# Patient Record
Sex: Male | Born: 1965 | Race: White | Hispanic: No | Smoking: Former smoker
Health system: Southern US, Community
[De-identification: ages and names within clinical notes are randomized; demographics above are authoritative.]

## PROBLEM LIST (undated history)

## (undated) DIAGNOSIS — I10 Essential (primary) hypertension: Secondary | ICD-10-CM

## (undated) DIAGNOSIS — A419 Sepsis, unspecified organism: Secondary | ICD-10-CM

## (undated) DIAGNOSIS — G9340 Encephalopathy, unspecified: Secondary | ICD-10-CM

## (undated) DIAGNOSIS — K219 Gastro-esophageal reflux disease without esophagitis: Secondary | ICD-10-CM

## (undated) DIAGNOSIS — R7989 Other specified abnormal findings of blood chemistry: Secondary | ICD-10-CM

## (undated) DIAGNOSIS — E46 Unspecified protein-calorie malnutrition: Secondary | ICD-10-CM

## (undated) DIAGNOSIS — F32A Depression, unspecified: Secondary | ICD-10-CM

## (undated) DIAGNOSIS — I4891 Unspecified atrial fibrillation: Secondary | ICD-10-CM

## (undated) DIAGNOSIS — J9600 Acute respiratory failure, unspecified whether with hypoxia or hypercapnia: Secondary | ICD-10-CM

## (undated) DIAGNOSIS — IMO0002 Reserved for concepts with insufficient information to code with codable children: Secondary | ICD-10-CM

## (undated) DIAGNOSIS — K819 Cholecystitis, unspecified: Secondary | ICD-10-CM

## (undated) DIAGNOSIS — D649 Anemia, unspecified: Secondary | ICD-10-CM

## (undated) DIAGNOSIS — N179 Acute kidney failure, unspecified: Secondary | ICD-10-CM

## (undated) DIAGNOSIS — A0472 Enterocolitis due to Clostridium difficile, not specified as recurrent: Secondary | ICD-10-CM

## (undated) DIAGNOSIS — I519 Heart disease, unspecified: Secondary | ICD-10-CM

## (undated) DIAGNOSIS — F329 Major depressive disorder, single episode, unspecified: Secondary | ICD-10-CM

## (undated) DIAGNOSIS — I82409 Acute embolism and thrombosis of unspecified deep veins of unspecified lower extremity: Secondary | ICD-10-CM

## (undated) DIAGNOSIS — K251 Acute gastric ulcer with perforation: Secondary | ICD-10-CM

## (undated) DIAGNOSIS — R778 Other specified abnormalities of plasma proteins: Secondary | ICD-10-CM

## (undated) HISTORY — PX: COLON SURGERY: SHX602

## (undated) HISTORY — PX: HERNIA REPAIR: SHX51

## (undated) HISTORY — PX: NECK SURGERY: SHX720

---

## 1998-09-20 ENCOUNTER — Encounter: Admission: RE | Admit: 1998-09-20 | Discharge: 1998-12-19 | Payer: Self-pay | Admitting: Orthopedic Surgery

## 1998-10-22 ENCOUNTER — Encounter: Payer: Self-pay | Admitting: Specialist

## 1998-10-22 ENCOUNTER — Observation Stay (HOSPITAL_COMMUNITY): Admission: RE | Admit: 1998-10-22 | Discharge: 1998-10-23 | Payer: Self-pay | Admitting: Specialist

## 1999-08-24 ENCOUNTER — Inpatient Hospital Stay (HOSPITAL_COMMUNITY): Admission: AD | Admit: 1999-08-24 | Discharge: 1999-08-29 | Payer: Self-pay | Admitting: *Deleted

## 2002-01-07 ENCOUNTER — Ambulatory Visit (HOSPITAL_BASED_OUTPATIENT_CLINIC_OR_DEPARTMENT_OTHER): Admission: RE | Admit: 2002-01-07 | Discharge: 2002-01-07 | Payer: Self-pay | Admitting: Urology

## 2002-01-14 ENCOUNTER — Encounter: Admission: RE | Admit: 2002-01-14 | Discharge: 2002-01-14 | Payer: Self-pay | Admitting: Neurosurgery

## 2002-01-14 ENCOUNTER — Encounter: Payer: Self-pay | Admitting: Neurosurgery

## 2002-01-28 ENCOUNTER — Encounter: Admission: RE | Admit: 2002-01-28 | Discharge: 2002-01-28 | Payer: Self-pay | Admitting: Neurosurgery

## 2002-01-28 ENCOUNTER — Encounter: Payer: Self-pay | Admitting: Neurosurgery

## 2002-02-11 ENCOUNTER — Encounter: Admission: RE | Admit: 2002-02-11 | Discharge: 2002-02-11 | Payer: Self-pay | Admitting: Neurosurgery

## 2002-02-11 ENCOUNTER — Encounter: Payer: Self-pay | Admitting: Neurosurgery

## 2004-11-02 ENCOUNTER — Ambulatory Visit: Payer: Self-pay | Admitting: Family Medicine

## 2004-12-07 ENCOUNTER — Ambulatory Visit: Payer: Self-pay | Admitting: Family Medicine

## 2004-12-20 ENCOUNTER — Ambulatory Visit: Payer: Self-pay | Admitting: Family Medicine

## 2005-01-17 ENCOUNTER — Ambulatory Visit: Payer: Self-pay | Admitting: Family Medicine

## 2005-01-31 ENCOUNTER — Ambulatory Visit: Payer: Self-pay | Admitting: Family Medicine

## 2005-02-28 ENCOUNTER — Ambulatory Visit: Payer: Self-pay | Admitting: Family Medicine

## 2005-04-11 ENCOUNTER — Ambulatory Visit: Payer: Self-pay | Admitting: Family Medicine

## 2005-05-09 ENCOUNTER — Ambulatory Visit: Payer: Self-pay | Admitting: Family Medicine

## 2005-08-08 ENCOUNTER — Ambulatory Visit: Payer: Self-pay | Admitting: Family Medicine

## 2005-09-04 ENCOUNTER — Ambulatory Visit: Payer: Self-pay | Admitting: Family Medicine

## 2006-03-02 ENCOUNTER — Ambulatory Visit (HOSPITAL_COMMUNITY): Admission: RE | Admit: 2006-03-02 | Discharge: 2006-03-02 | Payer: Self-pay | Admitting: Neurosurgery

## 2006-05-21 ENCOUNTER — Ambulatory Visit: Payer: Self-pay | Admitting: Family Medicine

## 2007-01-16 ENCOUNTER — Ambulatory Visit: Payer: Self-pay | Admitting: Family Medicine

## 2009-04-07 ENCOUNTER — Encounter: Admission: RE | Admit: 2009-04-07 | Discharge: 2009-04-07 | Payer: Self-pay | Admitting: Neurosurgery

## 2009-05-04 ENCOUNTER — Encounter: Admission: RE | Admit: 2009-05-04 | Discharge: 2009-05-04 | Payer: Self-pay | Admitting: Neurosurgery

## 2011-01-06 NOTE — Op Note (Signed)
[facility]  Patient:    James Mccormick, James Mccormick Visit Number: [id number] MRN: [id number]          Service Type: NES Location: [facility] Attending Physician:  Ellwood Handler Dictated by:   Verl Dicker, M.D. Proc. Date: 01/07/02 Admit Date:  01/07/2002   CC:         [name], M.D.   Operative Report  DATE OF BIRTH:  1965-09-26  LOCAL MEDICAL DOCTOR:  [name], M.D.  UROLOGIST:  Verl Dicker, M.D.  PREOPERATIVE DIAGNOSES:  Hematuria and flank pain.  POSTOPERATIVE DIAGNOSES:  Hematuria and flank pain.  INDICATIONS:  A 45 year old male, recovering alcoholic, currently taking Wellbutrin, Zoloft, Prevacid, and Singulair.  Recent thorough evaluation with Dr. Lysbeth Galas showed hematuria and proteinuria.  CT of the abdomen and pelvis, [facility], October 30, 2001, no abnormality identified.  Renal ultrasound, [facility], November 11, 2001, "within normal limits."  BUN 19, creatinine 1.2.  24-hour urine for protein November 29, 2001, 360 mg per 24 hours.  Urine culture negative on two separate occasions.  Office IVP, November 26, 2001, 13.1 cm right kidney, 13.7 cm left kidney, normal upper tracts, no filling defect or obstruction.  Cystoscopy, normal urethra and sphincter, nonobstructive prostate, blood-tinged efflux left orifice.  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  The patient feels his flank pain is much improved.  Presents to the operating room today for cystoscopy under anesthesia with retrogrades and upper tract urine sampling.  ANESTHESIA:  General.  DRAINS:  None.  COMPLICATIONS:  None.  DESCRIPTION OF PROCEDURE:  The patient was prepped and draped in the dorsal lithotomy position after institution of an adequate level of general anesthesia.  Bladder was carefully inspected with a well-lubricated panendoscope.  Bloody efflux left  orifice, pink-tinged efflux right orifice. Bladder itself showed no evidence of suspicious urothelium.  Bladder urine was sent for confirmation of hematuria and proteinuria.  Right and left ureteral catheters were then placed at 24 cm when a well-hydrated patient.  Urine was collected from both right and left renal pelvis and sent for confirmation of hematuria and proteinuria.  Retrogrades were then performed and showed no evidence of filling defect, obstruction, or anatomic deformity within the pelvis, calices, or ureters.  Ureteroscopy was not performed.  Bladder was drained and cystoscope was removed.  The patient was returned to recovery in satisfactory condition. Dictated by:   Verl Dicker, M.D. Attending Physician:  Ellwood Handler DD:  01/07/02 TD:  01/08/02 Job: [phone] [id number]

## 2012-09-17 ENCOUNTER — Other Ambulatory Visit (HOSPITAL_COMMUNITY): Payer: Self-pay | Admitting: Family Medicine

## 2012-09-17 DIAGNOSIS — R109 Unspecified abdominal pain: Secondary | ICD-10-CM

## 2012-09-19 ENCOUNTER — Ambulatory Visit (HOSPITAL_COMMUNITY): Admission: RE | Admit: 2012-09-19 | Payer: No Typology Code available for payment source | Source: Ambulatory Visit

## 2013-08-28 ENCOUNTER — Emergency Department (HOSPITAL_COMMUNITY): Payer: Medicaid Other

## 2013-08-28 ENCOUNTER — Emergency Department (HOSPITAL_COMMUNITY): Payer: Medicaid Other | Admitting: Anesthesiology

## 2013-08-28 ENCOUNTER — Inpatient Hospital Stay (HOSPITAL_COMMUNITY): Payer: Medicaid Other

## 2013-08-28 ENCOUNTER — Encounter (HOSPITAL_COMMUNITY): Payer: Medicaid Other | Admitting: Anesthesiology

## 2013-08-28 ENCOUNTER — Encounter (HOSPITAL_COMMUNITY): Admission: EM | Disposition: A | Discharge status: Home Health | Payer: Self-pay | Source: Home / Self Care

## 2013-08-28 ENCOUNTER — Encounter (HOSPITAL_COMMUNITY): Payer: Self-pay | Admitting: Emergency Medicine

## 2013-08-28 ENCOUNTER — Inpatient Hospital Stay (HOSPITAL_COMMUNITY)
Admission: EM | Admit: 2013-08-28 | Discharge: 2013-10-14 | DRG: 853 | Disposition: A | Discharge status: Home Health | Payer: Medicaid Other | Attending: General Surgery | Admitting: General Surgery

## 2013-08-28 DIAGNOSIS — E870 Hyperosmolality and hypernatremia: Secondary | ICD-10-CM | POA: Diagnosis not present

## 2013-08-28 DIAGNOSIS — A419 Sepsis, unspecified organism: Secondary | ICD-10-CM | POA: Diagnosis present

## 2013-08-28 DIAGNOSIS — R000 Tachycardia, unspecified: Secondary | ICD-10-CM | POA: Diagnosis not present

## 2013-08-28 DIAGNOSIS — T8132XA Disruption of internal operation (surgical) wound, not elsewhere classified, initial encounter: Secondary | ICD-10-CM | POA: Diagnosis not present

## 2013-08-28 DIAGNOSIS — R404 Transient alteration of awareness: Secondary | ICD-10-CM | POA: Diagnosis not present

## 2013-08-28 DIAGNOSIS — K255 Chronic or unspecified gastric ulcer with perforation: Secondary | ICD-10-CM | POA: Diagnosis present

## 2013-08-28 DIAGNOSIS — I10 Essential (primary) hypertension: Secondary | ICD-10-CM | POA: Diagnosis present

## 2013-08-28 DIAGNOSIS — E872 Acidosis, unspecified: Secondary | ICD-10-CM | POA: Diagnosis present

## 2013-08-28 DIAGNOSIS — T8140XA Infection following a procedure, unspecified, initial encounter: Secondary | ICD-10-CM | POA: Diagnosis not present

## 2013-08-28 DIAGNOSIS — Z818 Family history of other mental and behavioral disorders: Secondary | ICD-10-CM | POA: Diagnosis not present

## 2013-08-28 DIAGNOSIS — K219 Gastro-esophageal reflux disease without esophagitis: Secondary | ICD-10-CM | POA: Diagnosis present

## 2013-08-28 DIAGNOSIS — K316 Fistula of stomach and duodenum: Secondary | ICD-10-CM | POA: Diagnosis not present

## 2013-08-28 DIAGNOSIS — N170 Acute kidney failure with tubular necrosis: Secondary | ICD-10-CM | POA: Diagnosis not present

## 2013-08-28 DIAGNOSIS — F17200 Nicotine dependence, unspecified, uncomplicated: Secondary | ICD-10-CM | POA: Diagnosis present

## 2013-08-28 DIAGNOSIS — I214 Non-ST elevation (NSTEMI) myocardial infarction: Secondary | ICD-10-CM | POA: Diagnosis not present

## 2013-08-28 DIAGNOSIS — J4489 Other specified chronic obstructive pulmonary disease: Secondary | ICD-10-CM | POA: Diagnosis present

## 2013-08-28 DIAGNOSIS — R109 Unspecified abdominal pain: Secondary | ICD-10-CM

## 2013-08-28 DIAGNOSIS — R778 Other specified abnormalities of plasma proteins: Secondary | ICD-10-CM

## 2013-08-28 DIAGNOSIS — G9341 Metabolic encephalopathy: Secondary | ICD-10-CM | POA: Diagnosis present

## 2013-08-28 DIAGNOSIS — R1013 Epigastric pain: Secondary | ICD-10-CM | POA: Diagnosis present

## 2013-08-28 DIAGNOSIS — I509 Heart failure, unspecified: Secondary | ICD-10-CM | POA: Diagnosis not present

## 2013-08-28 DIAGNOSIS — I248 Other forms of acute ischemic heart disease: Secondary | ICD-10-CM | POA: Diagnosis not present

## 2013-08-28 DIAGNOSIS — R5381 Other malaise: Secondary | ICD-10-CM | POA: Diagnosis not present

## 2013-08-28 DIAGNOSIS — Y838 Other surgical procedures as the cause of abnormal reaction of the patient, or of later complication, without mention of misadventure at the time of the procedure: Secondary | ICD-10-CM | POA: Diagnosis not present

## 2013-08-28 DIAGNOSIS — E2749 Other adrenocortical insufficiency: Secondary | ICD-10-CM | POA: Diagnosis present

## 2013-08-28 DIAGNOSIS — E119 Type 2 diabetes mellitus without complications: Secondary | ICD-10-CM | POA: Diagnosis present

## 2013-08-28 DIAGNOSIS — K651 Peritoneal abscess: Secondary | ICD-10-CM | POA: Diagnosis present

## 2013-08-28 DIAGNOSIS — J449 Chronic obstructive pulmonary disease, unspecified: Secondary | ICD-10-CM | POA: Diagnosis present

## 2013-08-28 DIAGNOSIS — E875 Hyperkalemia: Secondary | ICD-10-CM | POA: Diagnosis not present

## 2013-08-28 DIAGNOSIS — Z8249 Family history of ischemic heart disease and other diseases of the circulatory system: Secondary | ICD-10-CM

## 2013-08-28 DIAGNOSIS — E440 Moderate protein-calorie malnutrition: Secondary | ICD-10-CM | POA: Diagnosis not present

## 2013-08-28 DIAGNOSIS — B3789 Other sites of candidiasis: Secondary | ICD-10-CM | POA: Diagnosis not present

## 2013-08-28 DIAGNOSIS — A0472 Enterocolitis due to Clostridium difficile, not specified as recurrent: Secondary | ICD-10-CM | POA: Diagnosis not present

## 2013-08-28 DIAGNOSIS — R198 Other specified symptoms and signs involving the digestive system and abdomen: Secondary | ICD-10-CM

## 2013-08-28 DIAGNOSIS — I5021 Acute systolic (congestive) heart failure: Secondary | ICD-10-CM | POA: Diagnosis not present

## 2013-08-28 DIAGNOSIS — R6520 Severe sepsis without septic shock: Secondary | ICD-10-CM | POA: Diagnosis present

## 2013-08-28 DIAGNOSIS — G9340 Encephalopathy, unspecified: Secondary | ICD-10-CM

## 2013-08-28 DIAGNOSIS — F411 Generalized anxiety disorder: Secondary | ICD-10-CM

## 2013-08-28 DIAGNOSIS — J189 Pneumonia, unspecified organism: Secondary | ICD-10-CM | POA: Diagnosis not present

## 2013-08-28 DIAGNOSIS — I2489 Other forms of acute ischemic heart disease: Secondary | ICD-10-CM | POA: Diagnosis not present

## 2013-08-28 DIAGNOSIS — D649 Anemia, unspecified: Secondary | ICD-10-CM | POA: Diagnosis present

## 2013-08-28 DIAGNOSIS — R1313 Dysphagia, pharyngeal phase: Secondary | ICD-10-CM | POA: Diagnosis not present

## 2013-08-28 DIAGNOSIS — F329 Major depressive disorder, single episode, unspecified: Secondary | ICD-10-CM | POA: Diagnosis present

## 2013-08-28 DIAGNOSIS — F4310 Post-traumatic stress disorder, unspecified: Secondary | ICD-10-CM | POA: Diagnosis present

## 2013-08-28 DIAGNOSIS — F3341 Major depressive disorder, recurrent, in partial remission: Secondary | ICD-10-CM

## 2013-08-28 DIAGNOSIS — G44209 Tension-type headache, unspecified, not intractable: Secondary | ICD-10-CM | POA: Diagnosis present

## 2013-08-28 DIAGNOSIS — R6521 Severe sepsis with septic shock: Secondary | ICD-10-CM

## 2013-08-28 DIAGNOSIS — J9600 Acute respiratory failure, unspecified whether with hypoxia or hypercapnia: Secondary | ICD-10-CM

## 2013-08-28 DIAGNOSIS — R7989 Other specified abnormal findings of blood chemistry: Secondary | ICD-10-CM

## 2013-08-28 DIAGNOSIS — I519 Heart disease, unspecified: Secondary | ICD-10-CM | POA: Diagnosis not present

## 2013-08-28 DIAGNOSIS — E876 Hypokalemia: Secondary | ICD-10-CM | POA: Diagnosis not present

## 2013-08-28 DIAGNOSIS — K650 Generalized (acute) peritonitis: Secondary | ICD-10-CM | POA: Diagnosis present

## 2013-08-28 DIAGNOSIS — F1011 Alcohol abuse, in remission: Secondary | ICD-10-CM | POA: Diagnosis present

## 2013-08-28 DIAGNOSIS — F17210 Nicotine dependence, cigarettes, uncomplicated: Secondary | ICD-10-CM | POA: Diagnosis present

## 2013-08-28 DIAGNOSIS — R931 Abnormal findings on diagnostic imaging of heart and coronary circulation: Secondary | ICD-10-CM

## 2013-08-28 DIAGNOSIS — J9819 Other pulmonary collapse: Secondary | ICD-10-CM | POA: Diagnosis not present

## 2013-08-28 DIAGNOSIS — K319 Disease of stomach and duodenum, unspecified: Secondary | ICD-10-CM

## 2013-08-28 DIAGNOSIS — T3995XA Adverse effect of unspecified nonopioid analgesic, antipyretic and antirheumatic, initial encounter: Secondary | ICD-10-CM | POA: Diagnosis present

## 2013-08-28 DIAGNOSIS — G4710 Hypersomnia, unspecified: Secondary | ICD-10-CM | POA: Diagnosis not present

## 2013-08-28 DIAGNOSIS — Z6827 Body mass index (BMI) 27.0-27.9, adult: Secondary | ICD-10-CM

## 2013-08-28 DIAGNOSIS — I469 Cardiac arrest, cause unspecified: Secondary | ICD-10-CM | POA: Diagnosis not present

## 2013-08-28 DIAGNOSIS — I4891 Unspecified atrial fibrillation: Secondary | ICD-10-CM

## 2013-08-28 DIAGNOSIS — J80 Acute respiratory distress syndrome: Secondary | ICD-10-CM | POA: Diagnosis present

## 2013-08-28 DIAGNOSIS — F32A Depression, unspecified: Secondary | ICD-10-CM | POA: Diagnosis present

## 2013-08-28 DIAGNOSIS — T81329A Deep disruption or dehiscence of operation wound, unspecified, initial encounter: Secondary | ICD-10-CM | POA: Diagnosis not present

## 2013-08-28 DIAGNOSIS — J90 Pleural effusion, not elsewhere classified: Secondary | ICD-10-CM | POA: Diagnosis not present

## 2013-08-28 HISTORY — DX: Major depressive disorder, single episode, unspecified: F32.9

## 2013-08-28 HISTORY — DX: Essential (primary) hypertension: I10

## 2013-08-28 HISTORY — DX: Depression, unspecified: F32.A

## 2013-08-28 HISTORY — PX: LAPAROTOMY: SHX154

## 2013-08-28 HISTORY — DX: Gastro-esophageal reflux disease without esophagitis: K21.9

## 2013-08-28 LAB — COMPREHENSIVE METABOLIC PANEL
ALBUMIN: 3.3 g/dL — AB (ref 3.5–5.2)
ALT: 13 U/L (ref 0–53)
AST: 18 U/L (ref 0–37)
Alkaline Phosphatase: 50 U/L (ref 39–117)
BUN: 20 mg/dL (ref 6–23)
CO2: 19 mEq/L (ref 19–32)
CREATININE: 0.65 mg/dL (ref 0.50–1.35)
Calcium: 5.8 mg/dL — CL (ref 8.4–10.5)
Chloride: 109 mEq/L (ref 96–112)
GFR calc Af Amer: >90 mL/min (ref >90)
GFR calc non Af Amer: >90 mL/min (ref >90)
Glucose, Bld: 129 mg/dL — ABNORMAL HIGH (ref 70–99)
POTASSIUM: 3.6 meq/L — AB (ref 3.7–5.3)
Sodium: 145 mEq/L (ref 137–147)
TOTAL PROTEIN: 5.4 g/dL — AB (ref 6.0–8.3)
Total Bilirubin: 0.3 mg/dL (ref 0.3–1.2)

## 2013-08-28 LAB — CBC WITH DIFFERENTIAL/PLATELET
BASOS ABS: 0 10*3/uL (ref 0.0–0.1)
BASOS PCT: 1 % (ref 0–1)
EOS ABS: 0.1 10*3/uL (ref 0.0–0.7)
Eosinophils Relative: 2 % (ref 0–5)
HCT: 46.1 % (ref 39.0–52.0)
HEMOGLOBIN: 16 g/dL (ref 13.0–17.0)
Lymphocytes Relative: 25 % (ref 12–46)
Lymphs Abs: 1.5 10*3/uL (ref 0.7–4.0)
MCH: 31.3 pg (ref 26.0–34.0)
MCHC: 34.7 g/dL (ref 30.0–36.0)
MCV: 90.2 fL (ref 78.0–100.0)
MONO ABS: 0.1 10*3/uL (ref 0.1–1.0)
MONOS PCT: 2 % — AB (ref 3–12)
NEUTROS ABS: 4.4 10*3/uL (ref 1.7–7.7)
NEUTROS PCT: 71 % (ref 43–77)
Platelets: 190 10*3/uL (ref 150–400)
RBC: 5.11 MIL/uL (ref 4.22–5.81)
RDW: 12.9 % (ref 11.5–15.5)
WBC: 6.2 10*3/uL (ref 4.0–10.5)

## 2013-08-28 LAB — LIPASE, BLOOD: LIPASE: 30 U/L (ref 11–59)

## 2013-08-28 SURGERY — LAPAROTOMY, EXPLORATORY
Anesthesia: General

## 2013-08-28 MED ORDER — HYDROMORPHONE HCL PF 1 MG/ML IJ SOLN: 0.2500 mg | INTRAMUSCULAR | Status: DC | PRN

## 2013-08-28 MED ORDER — HYDROMORPHONE HCL PF 1 MG/ML IJ SOLN: 1.0000 mg | INTRAMUSCULAR | Status: DC

## 2013-08-28 MED ORDER — KCL IN DEXTROSE-NACL 20-5-0.45 MEQ/L-%-% IV SOLN
INTRAVENOUS | Status: DC
  Administered 2013-08-29: 01:00:00 via INTRAVENOUS
  Filled 2013-08-28 (×2): qty 1000

## 2013-08-28 MED ORDER — FENTANYL CITRATE 0.05 MG/ML IJ SOLN: 100.0000 ug | INTRAMUSCULAR | Status: DC | PRN

## 2013-08-28 MED ORDER — ONDANSETRON HCL 4 MG/2ML IJ SOLN
4.0000 mg | Freq: Once | INTRAMUSCULAR | Status: AC
  Administered 2013-08-28: 4 mg via INTRAVENOUS

## 2013-08-28 MED ORDER — ONDANSETRON HCL 4 MG/2ML IJ SOLN
INTRAMUSCULAR | Status: AC
  Filled 2013-08-28: qty 2

## 2013-08-28 MED ORDER — FENTANYL CITRATE 0.05 MG/ML IJ SOLN
100.0000 ug | INTRAMUSCULAR | Status: DC | PRN
  Administered 2013-08-28: 75 ug via INTRAVENOUS

## 2013-08-28 MED ORDER — ONDANSETRON HCL 4 MG/2ML IJ SOLN: 4.0000 mg | Freq: Once | INTRAMUSCULAR | Status: DC

## 2013-08-28 MED ORDER — LIDOCAINE HCL (CARDIAC) 20 MG/ML IV SOLN
INTRAVENOUS | Status: DC | PRN
Start: 2013-08-28 — End: 2013-08-28
  Administered 2013-08-28: 100 mg via INTRAVENOUS

## 2013-08-28 MED ORDER — FENTANYL BOLUS VIA INFUSION
50.0000 ug | INTRAVENOUS | Status: DC | PRN
  Filled 2013-08-28: qty 100

## 2013-08-28 MED ORDER — VANCOMYCIN HCL IN DEXTROSE 1-5 GM/200ML-% IV SOLN
1000.0000 mg | Freq: Once | INTRAVENOUS | Status: AC
  Administered 2013-08-28: 1000 mg via INTRAVENOUS
  Filled 2013-08-28: qty 200

## 2013-08-28 MED ORDER — VANCOMYCIN HCL IN DEXTROSE 1-5 GM/200ML-% IV SOLN
1000.0000 mg | Freq: Three times a day (TID) | INTRAVENOUS | Status: DC
  Administered 2013-08-29 – 2013-08-30 (×5): 1000 mg via INTRAVENOUS
  Filled 2013-08-28 (×6): qty 200

## 2013-08-28 MED ORDER — SODIUM CHLORIDE 0.9 % IV BOLUS (SEPSIS): 1000.0000 mL | Freq: Once | INTRAVENOUS | Status: DC

## 2013-08-28 MED ORDER — FENTANYL CITRATE 0.05 MG/ML IJ SOLN
0.0000 ug/h | INTRAMUSCULAR | Status: DC
  Administered 2013-08-29: 50 ug/h via INTRAVENOUS
  Filled 2013-08-28: qty 50

## 2013-08-28 MED ORDER — OXYCODONE HCL 5 MG/5ML PO SOLN: 5.0000 mg | Freq: Once | ORAL | Status: DC | PRN

## 2013-08-28 MED ORDER — SODIUM CHLORIDE 0.9 % IV BOLUS (SEPSIS)
1000.0000 mL | Freq: Once | INTRAVENOUS | Status: AC
  Administered 2013-08-28: 1000 mL via INTRAVENOUS

## 2013-08-28 MED ORDER — SODIUM CHLORIDE 0.9 % IV SOLN
INTRAVENOUS | Status: DC | PRN
  Administered 2013-08-28: 20:00:00 via INTRAVENOUS

## 2013-08-28 MED ORDER — PROMETHAZINE HCL 25 MG/ML IJ SOLN: 6.2500 mg | INTRAMUSCULAR | Status: DC | PRN

## 2013-08-28 MED ORDER — IOHEXOL 300 MG/ML  SOLN
100.0000 mL | Freq: Once | INTRAMUSCULAR | Status: AC | PRN
Start: 2013-08-28 — End: 2013-08-28
  Administered 2013-08-28: 100 mL via INTRAVENOUS

## 2013-08-28 MED ORDER — HYDROMORPHONE HCL PF 1 MG/ML IJ SOLN
INTRAMUSCULAR | Status: AC
  Filled 2013-08-28: qty 1

## 2013-08-28 MED ORDER — LACTATED RINGERS IV SOLN
INTRAVENOUS | Status: DC | PRN
  Administered 2013-08-28 (×3): via INTRAVENOUS

## 2013-08-28 MED ORDER — SODIUM CHLORIDE 0.9 % IV SOLN: Freq: Once | INTRAVENOUS | Status: DC

## 2013-08-28 MED ORDER — HYDROMORPHONE HCL PF 1 MG/ML IJ SOLN
1.0000 mg | INTRAMUSCULAR | Status: AC
  Administered 2013-08-28: 1 mg via INTRAVENOUS

## 2013-08-28 MED ORDER — PROPOFOL 10 MG/ML IV BOLUS
INTRAVENOUS | Status: DC | PRN
  Administered 2013-08-28: 180 mg via INTRAVENOUS

## 2013-08-28 MED ORDER — FENTANYL CITRATE 0.05 MG/ML IJ SOLN
INTRAMUSCULAR | Status: DC | PRN
  Administered 2013-08-28: 50 ug via INTRAVENOUS
  Administered 2013-08-28: 100 ug via INTRAVENOUS
  Administered 2013-08-28 (×2): 50 ug via INTRAVENOUS

## 2013-08-28 MED ORDER — PANTOPRAZOLE SODIUM 40 MG IV SOLR
40.0000 mg | Freq: Two times a day (BID) | INTRAVENOUS | Status: DC
  Administered 2013-08-29 – 2013-09-02 (×11): 40 mg via INTRAVENOUS
  Filled 2013-08-28 (×16): qty 40

## 2013-08-28 MED ORDER — ENOXAPARIN SODIUM 40 MG/0.4ML ~~LOC~~ SOLN
40.0000 mg | SUBCUTANEOUS | Status: DC
  Administered 2013-08-29: 40 mg via SUBCUTANEOUS
  Filled 2013-08-28: qty 0.4

## 2013-08-28 MED ORDER — ROCURONIUM BROMIDE 100 MG/10ML IV SOLN
INTRAVENOUS | Status: DC | PRN
  Administered 2013-08-28: 30 mg via INTRAVENOUS
  Administered 2013-08-28: 50 mg via INTRAVENOUS
  Administered 2013-08-28: 20 mg via INTRAVENOUS

## 2013-08-28 MED ORDER — 0.9 % SODIUM CHLORIDE (POUR BTL) OPTIME
TOPICAL | Status: DC | PRN
  Administered 2013-08-28: 7000 mL

## 2013-08-28 MED ORDER — CALCIUM GLUCONATE 10 % IV SOLN: 1.0000 g | Freq: Once | INTRAVENOUS | Status: DC

## 2013-08-28 MED ORDER — ALBUMIN HUMAN 5 % IV SOLN
INTRAVENOUS | Status: DC | PRN
  Administered 2013-08-28 (×2): via INTRAVENOUS

## 2013-08-28 MED ORDER — PIPERACILLIN-TAZOBACTAM 3.375 G IVPB
3.3750 g | Freq: Three times a day (TID) | INTRAVENOUS | Status: DC
  Administered 2013-08-29 – 2013-09-03 (×15): 3.375 g via INTRAVENOUS
  Filled 2013-08-28 (×18): qty 50

## 2013-08-28 MED ORDER — PIPERACILLIN-TAZOBACTAM 3.375 G IVPB 30 MIN
3.3750 g | Freq: Once | INTRAVENOUS | Status: AC
  Administered 2013-08-28: 3.375 g via INTRAVENOUS
  Filled 2013-08-28: qty 50

## 2013-08-28 MED ORDER — OXYCODONE HCL 5 MG PO TABS: 5.0000 mg | ORAL_TABLET | Freq: Once | ORAL | Status: DC | PRN

## 2013-08-28 MED ORDER — PROPOFOL 10 MG/ML IV EMUL
5.0000 ug/kg/min | INTRAVENOUS | Status: DC
  Administered 2013-08-28: 50 ug/kg/min via INTRAVENOUS
  Filled 2013-08-28: qty 100

## 2013-08-28 MED ORDER — MIDAZOLAM HCL 5 MG/5ML IJ SOLN
INTRAMUSCULAR | Status: DC | PRN
  Administered 2013-08-28: 2 mg via INTRAVENOUS

## 2013-08-28 MED ORDER — LACTATED RINGERS IV SOLN
INTRAVENOUS | Status: DC | PRN
  Administered 2013-08-28: 22:00:00 via INTRAVENOUS

## 2013-08-28 MED ORDER — PANTOPRAZOLE SODIUM 40 MG IV SOLR
40.0000 mg | Freq: Once | INTRAVENOUS | Status: AC
  Administered 2013-08-28: 40 mg via INTRAVENOUS
  Filled 2013-08-28: qty 40

## 2013-08-28 MED ORDER — SODIUM CHLORIDE 0.9 % IV SOLN
1.0000 g | Freq: Once | INTRAVENOUS | Status: AC
  Administered 2013-08-28: 1 g via INTRAVENOUS
  Filled 2013-08-28: qty 10

## 2013-08-28 MED ORDER — PROPOFOL INFUSION 10 MG/ML OPTIME
INTRAVENOUS | Status: DC | PRN
  Administered 2013-08-28: 50 ug/kg/min via INTRAVENOUS

## 2013-08-28 MED ORDER — INSULIN ASPART 100 UNIT/ML ~~LOC~~ SOLN
1.0000 [IU] | SUBCUTANEOUS | Status: DC
  Administered 2013-08-29: 2 [IU] via SUBCUTANEOUS
  Administered 2013-08-29: 1 [IU] via SUBCUTANEOUS
  Administered 2013-08-29 (×2): 2 [IU] via SUBCUTANEOUS
  Administered 2013-08-30: 3 [IU] via SUBCUTANEOUS
  Administered 2013-08-30: 16:00:00 via SUBCUTANEOUS
  Administered 2013-08-30 (×2): 2 [IU] via SUBCUTANEOUS
  Administered 2013-08-30: 3 [IU] via SUBCUTANEOUS
  Administered 2013-08-31 (×2): 2 [IU] via SUBCUTANEOUS
  Administered 2013-08-31: 1 [IU] via SUBCUTANEOUS
  Administered 2013-08-31: 2 [IU] via SUBCUTANEOUS
  Administered 2013-08-31 – 2013-09-01 (×2): 1 [IU] via SUBCUTANEOUS

## 2013-08-28 MED ORDER — MIDAZOLAM HCL 2 MG/2ML IJ SOLN: 1.0000 mg | INTRAMUSCULAR | Status: DC | PRN

## 2013-08-28 MED ORDER — FENTANYL CITRATE 0.05 MG/ML IJ SOLN: 75.0000 ug | Freq: Once | INTRAMUSCULAR | Status: AC

## 2013-08-28 MED ORDER — FENTANYL CITRATE 0.05 MG/ML IJ SOLN
INTRAMUSCULAR | Status: AC
  Administered 2013-08-28: 75 ug via INTRAVENOUS
  Filled 2013-08-28: qty 2

## 2013-08-28 MED ORDER — FENTANYL CITRATE 0.05 MG/ML IJ SOLN: 50.0000 ug | Freq: Once | INTRAMUSCULAR | Status: DC

## 2013-08-28 MED ORDER — PHENYLEPHRINE HCL 10 MG/ML IJ SOLN
10.0000 mg | INTRAVENOUS | Status: DC | PRN
  Administered 2013-08-28: 50 ug/min via INTRAVENOUS

## 2013-08-28 SURGICAL SUPPLY — 52 items
BLADE SURG ROTATE 9660 (MISCELLANEOUS) ×3 IMPLANT
CANISTER SUCTION 2500CC (MISCELLANEOUS) ×6 IMPLANT
CHLORAPREP W/TINT 26ML (MISCELLANEOUS) ×3 IMPLANT
COVER MAYO STAND STRL (DRAPES) IMPLANT
COVER SURGICAL LIGHT HANDLE (MISCELLANEOUS) ×3 IMPLANT
DRAIN CHANNEL 19F RND (DRAIN) ×6 IMPLANT
DRAPE LAPAROSCOPIC ABDOMINAL (DRAPES) ×3 IMPLANT
DRAPE PROXIMA HALF (DRAPES) ×6 IMPLANT
DRAPE UTILITY 15X26 W/TAPE STR (DRAPE) ×6 IMPLANT
DRAPE WARM FLUID 44X44 (DRAPE) ×3 IMPLANT
DRSG OPSITE POSTOP 4X10 (GAUZE/BANDAGES/DRESSINGS) IMPLANT
DRSG OPSITE POSTOP 4X8 (GAUZE/BANDAGES/DRESSINGS) IMPLANT
ELECT BLADE 6.5 EXT (BLADE) ×3 IMPLANT
ELECT CAUTERY BLADE 6.4 (BLADE) ×3 IMPLANT
ELECT REM PT RETURN 9FT ADLT (ELECTROSURGICAL) ×3
ELECTRODE REM PT RTRN 9FT ADLT (ELECTROSURGICAL) ×1 IMPLANT
EVACUATOR SILICONE 100CC (DRAIN) ×6 IMPLANT
GLOVE BIO SURGEON STRL SZ8 (GLOVE) ×3 IMPLANT
GLOVE BIOGEL M STRL SZ7.5 (GLOVE) ×9 IMPLANT
GLOVE BIOGEL PI IND STRL 8 (GLOVE) ×3 IMPLANT
GLOVE BIOGEL PI INDICATOR 8 (GLOVE) ×6
GLOVE SURG SS PI 7.5 STRL IVOR (GLOVE) ×6 IMPLANT
GOWN STRL NON-REIN LRG LVL3 (GOWN DISPOSABLE) ×6 IMPLANT
GOWN STRL REIN XL XLG (GOWN DISPOSABLE) ×3 IMPLANT
KIT BASIN OR (CUSTOM PROCEDURE TRAY) ×6 IMPLANT
KIT ROOM TURNOVER OR (KITS) ×3 IMPLANT
LIGASURE IMPACT 36 18CM CVD LR (INSTRUMENTS) ×3 IMPLANT
NS IRRIG 1000ML POUR BTL (IV SOLUTION) ×69 IMPLANT
PACK GENERAL/GYN (CUSTOM PROCEDURE TRAY) ×3 IMPLANT
PAD ARMBOARD 7.5X6 YLW CONV (MISCELLANEOUS) ×3 IMPLANT
PENCIL BUTTON HOLSTER BLD 10FT (ELECTRODE) IMPLANT
SPECIMEN JAR LARGE (MISCELLANEOUS) IMPLANT
SPONGE GAUZE 4X4 12PLY (GAUZE/BANDAGES/DRESSINGS) ×3 IMPLANT
SPONGE LAP 18X18 X RAY DECT (DISPOSABLE) ×27 IMPLANT
STAPLER VISISTAT 35W (STAPLE) ×3 IMPLANT
SUCTION POOLE TIP (SUCTIONS) ×3 IMPLANT
SUT ETHILON 2 0 FS 18 (SUTURE) ×6 IMPLANT
SUT PDS AB 1 TP1 96 (SUTURE) ×6 IMPLANT
SUT SILK 2 0 SH CR/8 (SUTURE) ×3 IMPLANT
SUT SILK 2 0 TIES 10X30 (SUTURE) ×3 IMPLANT
SUT SILK 3 0 SH CR/8 (SUTURE) ×3 IMPLANT
SUT SILK 3 0 TIES 10X30 (SUTURE) ×3 IMPLANT
SUT VIC AB 2-0 SH 18 (SUTURE) ×6 IMPLANT
SUT VIC AB 3-0 SH 18 (SUTURE) ×3 IMPLANT
SUT VIC AB 3-0 SH 27 (SUTURE) ×2
SUT VIC AB 3-0 SH 27X BRD (SUTURE) ×1 IMPLANT
TAPE CLOTH SURG 6X10 WHT LF (GAUZE/BANDAGES/DRESSINGS) ×3 IMPLANT
TOWEL OR 17X26 10 PK STRL BLUE (TOWEL DISPOSABLE) ×6 IMPLANT
TRAY FOLEY CATH 16FRSI W/METER (SET/KITS/TRAYS/PACK) ×3 IMPLANT
TUBE CONNECTING 12'X1/4 (SUCTIONS) ×1
TUBE CONNECTING 12X1/4 (SUCTIONS) ×2 IMPLANT
YANKAUER SUCT BULB TIP NO VENT (SUCTIONS) ×3 IMPLANT

## 2013-08-28 NOTE — Progress Notes (Signed)
ANTIBIOTIC CONSULT NOTE - INITIAL  Pharmacy Consult for Vancomycin/Zosyn Indication: Perforated stomach with necrosis  No Known Allergies  Patient Measurements: Height: 5\' 11"  (180.3 cm) Weight: 225 lb (102.059 kg) IBW/kg (Calculated) : 75.3  Vital Signs: Temp: 98.5 F (36.9 C) (01/08 2310) Temp src: Oral (01/08 2310) BP: 124/80 mmHg (01/08 2315) Pulse Rate: 107 (01/08 2315)  Labs:  Recent Labs  08/28/13 1625  WBC 6.2  HGB 16.0  PLT 190  CREATININE 0.65   Estimated Creatinine Clearance: 135.9 ml/min (by C-G formula based on Cr of 0.65).  Medical History: Past Medical History  Diagnosis Date  . GERD (gastroesophageal reflux disease)   . Hypertension    Assessment: 48 y/o M s/p OR for perforated stomach with necrosis to continue broad spectrum antibiotics. WBC wnl, afebrile, other labs as above.   Vancomycin 1000 mg IV 1/8 at 1955 in OR Zosyn 3.375g IV x 1 1/8 2056 in OR  Goal of Therapy:  Vancomycin trough level 15-20 mcg/ml  Plan:  -Vancomycin 1000 mg IV q8h -Zosyn 3.375G IV q8h to be infused over 4 hours -Trend WBC, temp, renal function  -F/U any cultures, imaging  -Drug levels as indicated   Abran DukeLedford, Collyn Selk 08/28/2013,11:54 PM

## 2013-08-28 NOTE — Anesthesia Postprocedure Evaluation (Signed)
  Anesthesia Post-op Note  Patient: James Mccormick  Procedure(s) Performed: Procedure(s): EXPLORATORY LAPAROTOMY,debridment of nacrotic stomach,and primary repair of perforated stomach. (N/A)  Patient Location: ICU  Anesthesia Type:General  Level of Consciousness: Patient remains intubated per anesthesia plan  Airway and Oxygen Therapy: Patient remains intubated per anesthesia plan and Patient placed on Ventilator (see vital sign flow sheet for setting)  Post-op Pain: none  Post-op Assessment: Post-op Vital signs reviewed, Patient's Cardiovascular Status Stable, Respiratory Function Stable, Patent Airway, No signs of Nausea or vomiting and Pain level controlled  Post-op Vital Signs: Reviewed and stable  Complications: No apparent anesthesia complications

## 2013-08-28 NOTE — ED Notes (Signed)
PA and MD at bedside. 

## 2013-08-28 NOTE — Anesthesia Procedure Notes (Signed)
Procedure Name: Intubation Date/Time: 08/28/2013 8:03 PM Performed by: Molli HazardGORDON, James Mccormick Pre-anesthesia Checklist: Patient identified, Emergency Drugs available, Suction available and Patient being monitored Patient Re-evaluated:Patient Re-evaluated prior to inductionOxygen Delivery Method: Circle system utilized Preoxygenation: Pre-oxygenation with 100% oxygen Intubation Type: IV induction and Cricoid Pressure applied Ventilation: Mask ventilation without difficulty Laryngoscope Size: Miller and 2 Grade View: Grade II Tube type: Oral Tube size: 7.5 mm Number of attempts: 1 Airway Equipment and Method: Stylet Placement Confirmation: ETT inserted through vocal cords under direct vision,  positive ETCO2 and breath sounds checked- equal and bilateral Secured at: 22 cm Tube secured with: Tape Dental Injury: Teeth and Oropharynx as per pre-operative assessment

## 2013-08-28 NOTE — ED Notes (Signed)
Per EMS- Pt reports he ate lunch today at 1200, then had sharp epigastric pain to LUQ pain. Took baking soda and water concoction. It did not help and made worse. Has hx of GERD. Pt is side lying on right side will not move without assistance due to pain. Given 4 mg zofran, vomiting x 2 with EMS. BP 147/83, HR 86 SR. Has seen PCP recently for this and his GERD meds were changed. Pt is a x 4.

## 2013-08-28 NOTE — Brief Op Note (Addendum)
08/28/2013  11:34 PM  PATIENT:  James Mccormick  48 y.o. male  PRE-OPERATIVE DIAGNOSIS:  free air, peritonitis  POST-OPERATIVE DIAGNOSIS:  perforated less curvature of stomach with necrosis  PROCEDURE:  Procedure(s): EXPLORATORY LAPAROTOMY,debridment of necrotic stomach,and primary repair of perforated stomach in 2 layers. (N/A)  SURGEON:  Surgeon(s) and Role:    * Atilano InaEric M Luretta Everly, MD - Primary    * Thomas A. Cornett, MD - Assisting  PHYSICIAN ASSISTANT: none  ASSISTANTS: see above   ANESTHESIA:   general  EBL:  Total I/O In: 3200 [I.V.:2700; IV Piggyback:500] Out: 475 [Urine:475]  FINDINGS: large amount of grayish fluid with significant amount of particulate matter (partially digested nuts)  BLOOD ADMINISTERED:none  DRAINS: (19 fr) Jackson-Pratt drain(s) with closed bulb suction in the epigastrium/less curvature of stomach x 2, Nasogastric Tube and Urinary Catheter (Foley)   LOCAL MEDICATIONS USED:  NONE  SPECIMEN:  Source of Specimen:  lesser curvature of stomach  DISPOSITION OF SPECIMEN:  PATHOLOGY  COUNTS:  YES  TOURNIQUET:  * No tourniquets in log *  DICTATION: .Other Dictation: Dictation Number [id number]  PLAN OF CARE: Admit to inpatient   PATIENT DISPOSITION:  ICU - intubated and hemodynamically stable.   Delay start of Pharmacological VTE agent (>24hrs) due to surgical blood loss or risk of bleeding: no

## 2013-08-28 NOTE — Anesthesia Preprocedure Evaluation (Addendum)
Anesthesia Evaluation  Patient identified by MRN, date of birth, ID band Patient awake    Reviewed: Allergy & Precautions, H&P , NPO status , Patient's Chart, lab work & pertinent test results  Airway Mallampati: II TM Distance: >3 FB Neck ROM: Full    Dental   Pulmonary COPDCurrent Smoker,  + rhonchi         Cardiovascular hypertension, Rhythm:Regular Rate:Tachycardia     Neuro/Psych Depression    GI/Hepatic GERD-  ,(+)     substance abuse  alcohol use, ETOH abuse hx abd pain, free air   Endo/Other    Renal/GU      Musculoskeletal   Abdominal (+) + obese,  Abdomen: tender.    Peds  Hematology   Anesthesia Other Findings   Reproductive/Obstetrics                          Anesthesia Physical Anesthesia Plan  ASA: III and emergent  Anesthesia Plan: General   Post-op Pain Management:    Induction: Rapid sequence and Cricoid pressure planned  Airway Management Planned: Oral ETT  Additional Equipment:   Intra-op Plan:   Post-operative Plan: Possible Post-op intubation/ventilation and Extubation in OR  Informed Consent: I have reviewed the patients History and Physical, chart, labs and discussed the procedure including the risks, benefits and alternatives for the proposed anesthesia with the patient or authorized representative who has indicated his/her understanding and acceptance.     Plan Discussed with: CRNA and Surgeon  Anesthesia Plan Comments:         Anesthesia Quick Evaluation

## 2013-08-28 NOTE — Preoperative (Signed)
Beta Blockers   Reason not to administer Beta Blockers:Not Applicable 

## 2013-08-28 NOTE — Transfer of Care (Signed)
Immediate Anesthesia Transfer of Care Note  Patient: James Mccormick  Procedure(s) Performed: Procedure(s): EXPLORATORY LAPAROTOMY,debridment of nacratic stomach,and primary repair of perfrurated stoamach. (N/A)  Patient Location: SICU  Anesthesia Type:General  Level of Consciousness: Patient remains intubated per anesthesia plan  Airway & Oxygen Therapy: Patient remains intubated per anesthesia plan and Patient placed on Ventilator (see vital sign flow sheet for setting)  Post-op Assessment: Report given to PACU RN and Post -op Vital signs reviewed and stable  Post vital signs: Reviewed and stable  Complications: No apparent anesthesia complications

## 2013-08-28 NOTE — ED Notes (Signed)
Pt reports generalized abdominal pain x 3 weeks. States that this afternoon he had 10/10 pain after drinking baking soda and water. Pt extremely tender to touch, only able to lay on side. States when pain came on he became diaphoretic and nauseous.  Reports N/V since AM with diarrhea this AM.

## 2013-08-28 NOTE — ED Notes (Signed)
Verified with MD Romeo AppleHarrison that pt is appropriate to return to xray without receiving calcium gluconate yet.

## 2013-08-28 NOTE — ED Provider Notes (Signed)
CSN: [id number]     Arrival date & time 08/28/13  1542 History   First MD Initiated Contact with Patient 08/28/13 1544     Chief Complaint  Patient presents with  . Abdominal Pain   (Consider location/radiation/quality/duration/timing/severity/associated sxs/prior Treatment) The history is provided by the patient and the spouse.   Patient presents with acute worsening of epigastric pain at 1:30 or 2:00 this afternoon.  States he has had gradually worsening indigestion for the past 2-3 weeks.  He takes an unknown antacid and a lot of Goody Powders for his pain.  Today he took baking soda for his pain and suddenly developed worsening pain.  Pain is burning in epigastrium.  Associated N/V, emesis contained some blood.  Last BM was this morning and was soft.  No hx abdominal surgeries.  No urinary symptoms.  Pt has had some SOB with the increased pain but did not have SOB prior to the pain.  Denies CP.   Past Medical History  Diagnosis Date  . GERD (gastroesophageal reflux disease)   . Hypertension    Past Surgical History  Procedure Laterality Date  . Neck surgery     No family history on file. History  Substance Use Topics  . Smoking status: Never Smoker   . Smokeless tobacco: Not on file  . Alcohol Use: No    Review of Systems  Unable to perform ROS: Acuity of condition  Constitutional: Positive for chills and diaphoresis. Negative for fever.  Respiratory: Positive for cough and shortness of breath.   Cardiovascular: Negative for chest pain.  Gastrointestinal: Positive for nausea, vomiting and abdominal pain. Negative for diarrhea, constipation and blood in stool.  Genitourinary: Negative for dysuria, urgency and frequency.    Allergies  Review of patient's allergies indicates no known allergies.  Home Medications   Current Outpatient Rx  Name  Route  Sig  Dispense  Refill  . sertraline (ZOLOFT) 50 MG tablet   Oral   Take 50 mg by mouth 3 (three) times daily.          . valsartan (DIOVAN) 320 MG tablet   Oral   Take 320 mg by mouth daily.          BP 107/56  Pulse 120  Temp(Src) 97.7 F (36.5 C) (Oral)  Resp 33  Ht 5\' 11"  (1.803 m)  Wt 225 lb (102.059 kg)  BMI 31.39 kg/m2  SpO2 94% Physical Exam  Nursing note and vitals reviewed. Constitutional: He appears well-developed and well-nourished. No distress.  HENT:  Head: Normocephalic and atraumatic.  Neck: Neck supple.  Cardiovascular: Normal rate and regular rhythm.   Pulmonary/Chest: Effort normal and breath sounds normal. No respiratory distress. He has no wheezes. He has no rales.  Abdominal: Soft. He exhibits distension. There is tenderness. There is no rigidity, no rebound and no guarding.  Patient lying on right side, unable to lie flat for exam secondary to pain.  Examination on right lateral recumbent position shows diffuse tenderness and distension, heaviness/mass to right side of abdomen.  No guarding, no rebound.   Neurological: He is alert. He exhibits normal muscle tone.  Skin: He is not diaphoretic.    ED Course  Procedures (including critical care time) Labs Review Labs Reviewed  CBC WITH DIFFERENTIAL - Abnormal; Notable for the following:    Monocytes Relative 2 (*)    All other components within normal limits  COMPREHENSIVE METABOLIC PANEL - Abnormal; Notable for the following:    Potassium 3.6 (*)  Glucose, Bld 129 (*)    Calcium 5.8 (*)    Total Protein 5.4 (*)    Albumin 3.3 (*)    All other components within normal limits  LIPASE, BLOOD  URINALYSIS, ROUTINE W REFLEX MICROSCOPIC   Imaging Review Ct Abdomen Pelvis W Contrast  08/28/2013   CLINICAL DATA:  Abdominal pain and pneumoperitoneum.  EXAM: CT ABDOMEN AND PELVIS WITH CONTRAST  TECHNIQUE: Multidetector CT imaging of the abdomen and pelvis was performed using the standard protocol following bolus administration of intravenous contrast.  CONTRAST:  100mL OMNIPAQUE IOHEXOL 300 MG/ML  SOLN  COMPARISON:   Abdominal radiograph 08/28/2013  FINDINGS: There is a small right pleural effusion with extensive atelectasis at the right lung base. Mild atelectasis at the left lung base.  There is pneumoperitoneum with a collection a gas along the anterior abdomen and multiple locules of gas throughout the upper abdomen. There is a moderate amount of abdominal ascites. Mild distension of the esophagus which contains fluid. There may be wall thickening along the anterior gastric body and there is gas in the gastrohepatic ligament. There is edema and stranding throughout the anterior upper abdomen. There is an air-fluid level within the stomach and some of the fluid appears to have fatty content. No gross abnormality into the duodenum. Mild dilatation of small bowel loops. There is colonic diverticulosis but no evidence to suggest acute colonic inflammation. No gross abnormality to the appendix.  No significant lymphadenopathy. No gross abnormality to the prostate, seminal vesicles or urinary bladder. No gross abnormality to the liver, gallbladder or portal venous system. Normal appearance of the spleen, adrenal glands, kidneys and pancreas. Incidentally, there are punctate nonobstructive left kidney stones. No acute bone abnormality. Degenerative disc changes in the lower lumbar spine.  IMPRESSION: Study is positive for pneumoperitoneum and free fluid. Findings are suggestive for a perforated viscus. The majority of inflammatory changes and gas are located in the upper abdomen and centered around the stomach. There may be mild wall thickening along the anterior aspect of the stomach.  Colonic diverticulosis but there is not clear evidence for acute colonic inflammation.  Right basilar atelectasis with a small amount a right pleural fluid.  Mild dilatation of small bowel loops could represent an ileus.  Nonobstructive left kidney stones.  Critical Value/emergent results were called by telephone at the time of interpretation on  08/28/2013 at 7:40 PM to Dr. Gaynelle AduEric Wilson, who verbally acknowledged these results.   Electronically Signed   By: Richarda OverlieAdam  Henn M.D.   On: 08/28/2013 19:45   Dg Abd Acute W/chest  08/28/2013   CLINICAL DATA:  Severe abdominal pain.  EXAM: ACUTE ABDOMEN SERIES (ABDOMEN 2 VIEW & CHEST 1 VIEW)  COMPARISON:  None.  FINDINGS: The left lower decubitus image demonstrates lucency in the right upper abdomen and most compatible with free air. Chest radiograph demonstrates very low lung volumes. Prominent interstitial markings are probably related to the low lung volumes. Heart size is within normal limits. Small amount of bowel gas in the abdomen. There is stool along the right side of the colon.  IMPRESSION: Study is positive for free air. Findings are concerning for a bowel perforation and recommend surgical consultation.  Low lung volumes.  Critical Value/emergent results were called by telephone at the time of interpretation on 08/28/2013 at 6:27 PM to Dr. Trixie DredgeEMILY Hallee Mckenny , who verbally acknowledged these results.   Electronically Signed   By: Richarda OverlieAdam  Henn M.D.   On: 08/28/2013 18:28    EKG  Interpretation    Date/Time:  Thursday August 28 2013 15:57:53 EST Ventricular Rate:  92 PR Interval:  152 QRS Duration: 94 QT Interval:  410 QTC Calculation: 507 R Axis:   7 Text Interpretation:  Sinus rhythm Probable left atrial enlargement Abnormal R-wave progression, early transition Prolonged QT interval Baseline wander in lead(s) V2 V6 Confirmed by Romeo Apple  MD, FORREST ([id number]) on 08/28/2013 4:01:38 PM           6:37 PM Spoke with Dr Andrey Campanile.  Discussed patient's history and presentation, discussed findings of free air and patient's current tachycardia, soft BP, low O2 saturdation.   Per discussion, will order 2nd IVF bolus, then run at 150cc/hr, CT abd/pelvis and have nurse page him as soon as patient returns to room.    6:42 PM Spoke with Dr Lowella Dandy.  Also called CT techs to expedite CT.    CRITICAL CARE Performed by:  Trixie Dredge   Total critical care time: 30  Critical care time was exclusive of separately billable procedures and treating other patients.  Critical care was necessary to treat or prevent imminent or life-threatening deterioration.  Critical care was time spent personally by me on the following activities: development of treatment plan with patient and/or surrogate as well as nursing, discussions with consultants, evaluation of patient's response to treatment, examination of patient, obtaining history from patient or surrogate, ordering and performing treatments and interventions, ordering and review of laboratory studies, ordering and review of radiographic studies, pulse oximetry and re-evaluation of patient's condition.   MDM   1. Perforated viscus    Pt with sudden increase in abdominal pain with associated N/V following several weeks of severe indigestion.  Patient has been taking goody powders "like candy" per wife.  Pt very uncomfortable on initial exam and unable to lie flat for exam.  Pt treated with IVF, IV dilaudid, IV zofran. Free air found on plain film.  Discussed patient with Dr Andrey Campanile who requested CT abd/pelvis and came quickly to see the patient.  OR was made available and patient taken from CT to PACU.  Admitted to General Surgery, Dr Andrey Campanile.     Trixie Dredge, PA-C 08/28/13 [address] [place], [place] 08/30/13 [phone]

## 2013-08-28 NOTE — H&P (Signed)
James Mccormick is an 48 y.o. male.   Chief Complaint: abd pain HPI: 48 year old Caucasian male presents to the ER late this afternoon complaining of severe worsening upper abdominal pain. He states that he had been feeling ill for the past 2-3 weeks. He been having pain more in the evening with burning in his epigastrium. He been taking prescription omeprazole without relief. Because of worsening discomfort he started taking Aleve as well as a large amount of Goody's powders. The pain acutely worsened this afternoon causing him to double over and he certainly came to the emergency department. Is associated with nausea and vomiting. He had a little bit of blood-tinged emesis. He had fever and chills in the ER. Because of that severe pain he complains of some shortness of breath. He denies any recent shortness of breath or dyspnea on exertion over the past couple weeks or months. He does smoke a pack a day. He denies any significant alcohol. He denies any family history of stomach cancer. He has never had an upper endoscopy.   Past Medical History  Diagnosis Date  . GERD (gastroesophageal reflux disease)   . Hypertension     Past Surgical History  Procedure Laterality Date  . Neck surgery      No family history on file. Social History:  reports that he has been smoking Cigarettes.  He has been smoking about 1.00 pack per day. He does not have any smokeless tobacco history on file. He reports that he does not drink alcohol or use illicit drugs.  Allergies: No Known Allergies  Medications Prior to Admission  Medication Sig Dispense Refill  . sertraline (ZOLOFT) 50 MG tablet Take 50 mg by mouth 3 (three) times daily.      . valsartan (DIOVAN) 320 MG tablet Take 320 mg by mouth daily.        Results for orders placed during the hospital encounter of 08/28/13 (from the past 48 hour(s))  CBC WITH DIFFERENTIAL     Status: Abnormal   Collection Time    08/28/13  4:25 PM      Result Value  Range   WBC 6.2  4.0 - 10.5 K/uL   RBC 5.11  4.22 - 5.81 MIL/uL   Hemoglobin 16.0  13.0 - 17.0 g/dL   HCT 46.1  39.0 - 52.0 %   MCV 90.2  78.0 - 100.0 fL   MCH 31.3  26.0 - 34.0 pg   MCHC 34.7  30.0 - 36.0 g/dL   RDW 12.9  11.5 - 15.5 %   Platelets 190  150 - 400 K/uL   Neutrophils Relative % 71  43 - 77 %   Neutro Abs 4.4  1.7 - 7.7 K/uL   Lymphocytes Relative 25  12 - 46 %   Lymphs Abs 1.5  0.7 - 4.0 K/uL   Monocytes Relative 2 (*) 3 - 12 %   Monocytes Absolute 0.1  0.1 - 1.0 K/uL   Eosinophils Relative 2  0 - 5 %   Eosinophils Absolute 0.1  0.0 - 0.7 K/uL   Basophils Relative 1  0 - 1 %   Basophils Absolute 0.0  0.0 - 0.1 K/uL  COMPREHENSIVE METABOLIC PANEL     Status: Abnormal   Collection Time    08/28/13  4:25 PM      Result Value Range   Sodium 145  137 - 147 mEq/L   Potassium 3.6 (*) 3.7 - 5.3 mEq/L   Comment: HEMOLYSIS AT THIS  LEVEL MAY AFFECT RESULT   Chloride 109  96 - 112 mEq/L   CO2 19  19 - 32 mEq/L   Glucose, Bld 129 (*) 70 - 99 mg/dL   BUN 20  6 - 23 mg/dL   Creatinine, Ser 0.65  0.50 - 1.35 mg/dL   Calcium 5.8 (*) 8.4 - 10.5 mg/dL   Comment: CRITICAL RESULT CALLED TO, READ BACK BY AND VERIFIED WITH:     Randall Hiss 1704 08/28/13 D BRADLEY   Total Protein 5.4 (*) 6.0 - 8.3 g/dL   Albumin 3.3 (*) 3.5 - 5.2 g/dL   AST 18  0 - 37 U/L   Comment: HEMOLYSIS AT THIS LEVEL MAY AFFECT RESULT   ALT 13  0 - 53 U/L   Comment: HEMOLYSIS AT THIS LEVEL MAY AFFECT RESULT   Alkaline Phosphatase 50  39 - 117 U/L   Total Bilirubin 0.3  0.3 - 1.2 mg/dL   GFR calc non Af Amer >90  >90 mL/min   GFR calc Af Amer >90  >90 mL/min   Comment: (NOTE)     The eGFR has been calculated using the CKD EPI equation.     This calculation has not been validated in all clinical situations.     eGFR's persistently <90 mL/min signify possible Chronic Kidney     Disease.  LIPASE, BLOOD     Status: None   Collection Time    08/28/13  4:25 PM      Result Value Range   Lipase 30  11 - 59 U/L    Ct Abdomen Pelvis W Contrast  08/28/2013   CLINICAL DATA:  Abdominal pain and pneumoperitoneum.  EXAM: CT ABDOMEN AND PELVIS WITH CONTRAST  TECHNIQUE: Multidetector CT imaging of the abdomen and pelvis was performed using the standard protocol following bolus administration of intravenous contrast.  CONTRAST:  162m OMNIPAQUE IOHEXOL 300 MG/ML  SOLN  COMPARISON:  Abdominal radiograph 08/28/2013  FINDINGS: There is a small right pleural effusion with extensive atelectasis at the right lung base. Mild atelectasis at the left lung base.  There is pneumoperitoneum with a collection a gas along the anterior abdomen and multiple locules of gas throughout the upper abdomen. There is a moderate amount of abdominal ascites. Mild distension of the esophagus which contains fluid. There may be wall thickening along the anterior gastric body and there is gas in the gastrohepatic ligament. There is edema and stranding throughout the anterior upper abdomen. There is an air-fluid level within the stomach and some of the fluid appears to have fatty content. No gross abnormality into the duodenum. Mild dilatation of small bowel loops. There is colonic diverticulosis but no evidence to suggest acute colonic inflammation. No gross abnormality to the appendix.  No significant lymphadenopathy. No gross abnormality to the prostate, seminal vesicles or urinary bladder. No gross abnormality to the liver, gallbladder or portal venous system. Normal appearance of the spleen, adrenal glands, kidneys and pancreas. Incidentally, there are punctate nonobstructive left kidney stones. No acute bone abnormality. Degenerative disc changes in the lower lumbar spine.  IMPRESSION: Study is positive for pneumoperitoneum and free fluid. Findings are suggestive for a perforated viscus. The majority of inflammatory changes and gas are located in the upper abdomen and centered around the stomach. There may be mild wall thickening along the anterior  aspect of the stomach.  Colonic diverticulosis but there is not clear evidence for acute colonic inflammation.  Right basilar atelectasis with a small amount a right pleural fluid.  Mild dilatation of small bowel loops could represent an ileus.  Nonobstructive left kidney stones.  Critical Value/emergent results were called by telephone at the time of interpretation on 08/28/2013 at 7:40 PM to Dr. Greer Pickerel, who verbally acknowledged these results.   Electronically Signed   By: Markus Daft M.D.   On: 08/28/2013 19:45   Dg Abd Acute W/chest  08/28/2013   CLINICAL DATA:  Severe abdominal pain.  EXAM: ACUTE ABDOMEN SERIES (ABDOMEN 2 VIEW & CHEST 1 VIEW)  COMPARISON:  None.  FINDINGS: The left lower decubitus image demonstrates lucency in the right upper abdomen and most compatible with free air. Chest radiograph demonstrates very low lung volumes. Prominent interstitial markings are probably related to the low lung volumes. Heart size is within normal limits. Small amount of bowel gas in the abdomen. There is stool along the right side of the colon.  IMPRESSION: Study is positive for free air. Findings are concerning for a bowel perforation and recommend surgical consultation.  Low lung volumes.  Critical Value/emergent results were called by telephone at the time of interpretation on 08/28/2013 at 6:27 PM to Dr. Clayton Bibles , who verbally acknowledged these results.   Electronically Signed   By: Markus Daft M.D.   On: 08/28/2013 18:28    Review of Systems  Constitutional: Positive for fever and chills. Negative for weight loss.  HENT: Negative for nosebleeds.   Eyes: Negative for blurred vision.  Respiratory: Positive for shortness of breath.   Cardiovascular: Negative for chest pain, palpitations, orthopnea and PND.       Denies DOE  Gastrointestinal: Positive for heartburn, nausea, vomiting and abdominal pain.  Genitourinary: Negative for dysuria and hematuria.  Musculoskeletal: Negative.   Skin: Negative  for itching and rash.  Neurological: Negative for dizziness, focal weakness, seizures, loss of consciousness and headaches.       Denies TIAs, amaurosis fugax  Endo/Heme/Allergies: Does not bruise/bleed easily.  Psychiatric/Behavioral: The patient is not nervous/anxious.     Blood pressure 115/73, pulse 129, temperature 98.8 F (37.1 C), temperature source Oral, resp. rate 22, height _0  (1.803 m), weight 225 lb (102.059 kg), SpO2 91.00%. Physical Exam  Vitals reviewed. Constitutional: He is oriented to person, place, and time. He appears well-developed and well-nourished. He appears ill. He appears distressed.  HENT:  Head: Normocephalic and atraumatic.  Right Ear: External ear normal.  Left Ear: External ear normal.  Eyes: Conjunctivae are normal.  Neck: No tracheal deviation present.  Cardiovascular: Regular rhythm and intact distal pulses.  Tachycardia present.   Respiratory: Effort normal. No respiratory distress. He has no wheezes.  GI: He exhibits no distension. There is tenderness in the right upper quadrant, epigastric area and left upper quadrant. There is rigidity and guarding.  Firm abd. TTP. Rigid.   Musculoskeletal: He exhibits no edema.  Lymphadenopathy:    He has no cervical adenopathy.  Neurological: He is alert and oriented to person, place, and time.  Skin: Skin is warm and dry.  Psychiatric: He has a normal mood and affect. His behavior is normal. Judgment and thought content normal.     Assessment/Plan Abdominal pain/peritonitis Tachycardia Pneumoperitoneum With free fluid Tobacco use hypokalemia  Given the findings on the CT scan which I reviewed with the radiologist it appears that this is most likely consistent with a perforated stomach ulcer due to NSAID use. Because of the pneumoperitoneum and peritonitis I recommended emergency exploratory laparotomy. I discussed the risk and benefits with the patient along  with his sister and daughter. We discussed  the risk of ongoing infection, abscess formation, injury to surrounding structures, incisional hernia formation, need for additional procedures, postoperative ileus, bleeding, prolonged hospitalization, leak, blood clot formation, and a prolonged hospital stay. We discussed the possibility of him getting sicker before he gets better.I explained the typical postoperative course.  He'll be taken emergently to the operating room for exploratory laparotomy, repair of gastric perforation with Phillip Heal patch. He will receive broad-spectrum IV antibiotics and large volume fluid resuscitation  Leighton Ruff. Redmond Pulling, MD, FACS General, Bariatric, & Minimally Invasive Surgery [facility], [place]   [facility] M 08/28/2013, 7:58 PM

## 2013-08-28 NOTE — Consult Note (Signed)
Name: James Mccormick MRN: [id number] DOB: 26-Jun-1966    ADMISSION DATE:  08/28/2013 CONSULTATION DATE:  08/28/13  REFERRING MD :  Andrey Campanile PRIMARY SERVICE: CCS  CHIEF COMPLAINT:  Abdominal Pain  BRIEF PATIENT DESCRIPTION: James Mccormick is a 48 year old male who presented with a perforated stomach ulcer due to NSAID use who underwent exploratory laparotomy abd repair of ulcer on 08/28/13.  SIGNIFICANT EVENTS / STUDIES:  1/8 CT 1/8 Exploratory Laparotomy with repair  LINES / TUBES: 1/8 ETT 1/8 Abdominal drain 1/8 LUE PIV 1/8 RUE PIV 1/8 Foley  CULTURES: None  ANTIBIOTICS: Vancomycin IV 1/8 >>>> Zosyn IV 1/8 >>>>  HISTORY OF PRESENT ILLNESS:  James Mccormick is a 48 year old male with PMH of GERD, HTN who presented to [facility] on 1/8 with progressive severe upper abdominal pain not relieved by omeprazole with associated fever and chills.  He admitted recent consumption of large amounts of Aleve and Goody powders. Xray in the ED revealed free air under the diaphram a CT confirmed this and patient was taken for an exploratory lap with repair of gastric ulcer.  PAST MEDICAL HISTORY :  Past Medical History  Diagnosis Date  . GERD (gastroesophageal reflux disease)   . Hypertension    Past Surgical History  Procedure Laterality Date  . Neck surgery     Prior to Admission medications   Medication Sig Start Date End Date Taking? Authorizing Provider  sertraline (ZOLOFT) 50 MG tablet Take 50 mg by mouth 3 (three) times daily.   Yes Historical Provider, MD  valsartan (DIOVAN) 320 MG tablet Take 320 mg by mouth daily.   Yes Historical Provider, MD   No Known Allergies  FAMILY HISTORY:  No family history on file. SOCIAL HISTORY:  reports that he has been smoking Cigarettes.  He has been smoking about 1.00 pack per day. He does not have any smokeless tobacco history on file. He reports that he does not drink alcohol or use illicit drugs.  REVIEW OF SYSTEMS:  Unable to  obtain as patient is sedated.  SUBJECTIVE:   VITAL SIGNS: Temp:  [97.7 F (36.5 C)-98.8 F (37.1 C)] 98.5 F (36.9 C) (01/08 2310) Pulse Rate:  [93-129] 107 (01/08 2315) Resp:  [15-33] 24 (01/08 2315) BP: (107-151)/(56-80) 124/80 mmHg (01/08 2315) SpO2:  [89 %-100 %] 100 % (01/08 2315) FiO2 (%):  [100 %] 100 % (01/08 2310) Weight:  [225 lb (102.059 kg)] 225 lb (102.059 kg) (01/08 1550) HEMODYNAMICS:   VENTILATOR SETTINGS: Vent Mode:  [-] PRVC FiO2 (%):  [100 %] 100 % Set Rate:  [15 bmp] 15 bmp Vt Set:  [600 mL] 600 mL PEEP:  [5 cmH20] 5 cmH20 Plateau Pressure:  [22 cmH20] 22 cmH20 INTAKE / OUTPUT: Intake/Output     01/08 0701 - 01/09 0700   I.V. (mL/kg) 2700 (26.5)   IV Piggyback 500   Total Intake(mL/kg) 3200 (31.4)   Urine (mL/kg/hr) 475   Total Output 475   Net +2725         PHYSICAL EXAMINATION: General:  Intubated, Sedated Neuro:  RASS -1. HEENT:  [place]/AT Cardiovascular:  Tachycardic, no murmur Lungs:  CTA b/l Abdomen:  Bandages c/d/i, drain suctioning Musculoskeletal:  No edmea Skin:  Warm, dry  LABS:  CBC  Recent Labs Lab 08/28/13 1625  WBC 6.2  HGB 16.0  HCT 46.1  PLT 190   BMET  Recent Labs Lab 08/28/13 1625  NA 145  K 3.6*  CL 109  CO2 19  BUN 20  CREATININE 0.65  GLUCOSE 129*   Electrolytes  Recent Labs Lab 08/28/13 1625  CALCIUM 5.8*   Liver Enzymes  Recent Labs Lab 08/28/13 1625  AST 18  ALT 13  ALKPHOS 50  BILITOT 0.3  ALBUMIN 3.3*    Imaging Ct Abdomen Pelvis W Contrast  08/28/2013   CLINICAL DATA:  Abdominal pain and pneumoperitoneum.  EXAM: CT ABDOMEN AND PELVIS WITH CONTRAST  TECHNIQUE: Multidetector CT imaging of the abdomen and pelvis was performed using the standard protocol following bolus administration of intravenous contrast.  CONTRAST:  100mL OMNIPAQUE IOHEXOL 300 MG/ML  SOLN  COMPARISON:  Abdominal radiograph 08/28/2013  FINDINGS: There is a small right pleural effusion with extensive atelectasis at  the right lung base. Mild atelectasis at the left lung base.  There is pneumoperitoneum with a collection a gas along the anterior abdomen and multiple locules of gas throughout the upper abdomen. There is a moderate amount of abdominal ascites. Mild distension of the esophagus which contains fluid. There may be wall thickening along the anterior gastric body and there is gas in the gastrohepatic ligament. There is edema and stranding throughout the anterior upper abdomen. There is an air-fluid level within the stomach and some of the fluid appears to have fatty content. No gross abnormality into the duodenum. Mild dilatation of small bowel loops. There is colonic diverticulosis but no evidence to suggest acute colonic inflammation. No gross abnormality to the appendix.  No significant lymphadenopathy. No gross abnormality to the prostate, seminal vesicles or urinary bladder. No gross abnormality to the liver, gallbladder or portal venous system. Normal appearance of the spleen, adrenal glands, kidneys and pancreas. Incidentally, there are punctate nonobstructive left kidney stones. No acute bone abnormality. Degenerative disc changes in the lower lumbar spine.  IMPRESSION: Study is positive for pneumoperitoneum and free fluid. Findings are suggestive for a perforated viscus. The majority of inflammatory changes and gas are located in the upper abdomen and centered around the stomach. There may be mild wall thickening along the anterior aspect of the stomach.  Colonic diverticulosis but there is not clear evidence for acute colonic inflammation.  Right basilar atelectasis with a small amount a right pleural fluid.  Mild dilatation of small bowel loops could represent an ileus.  Nonobstructive left kidney stones.  Critical Value/emergent results were called by telephone at the time of interpretation on 08/28/2013 at 7:40 PM to Dr. Gaynelle AduEric Wilson, who verbally acknowledged these results.   Electronically Signed   By: Richarda OverlieAdam   Henn M.D.   On: 08/28/2013 19:45   Dg Chest Port 1 View  08/29/2013   CLINICAL DATA:  Hypoxia  EXAM: PORTABLE CHEST - 1 VIEW  COMPARISON:  Study obtained earlier in the day  FINDINGS: Endotracheal tube is 3.3 cm above the carina. Nasogastric tube tip and side port are in the stomach. No pneumothorax.  There is atelectatic change in the right mid lung. Lungs are otherwise clear. Heart size and pulmonary vascularity are normal. No adenopathy.  IMPRESSION: Tube positions as described without pneumothorax. Mild right midlung atelectasis. Elsewhere lungs are clear.   Electronically Signed   By: Bretta BangWilliam  Woodruff M.D.   On: 08/29/2013 00:02   Dg Abd Acute W/chest  08/28/2013   CLINICAL DATA:  Severe abdominal pain.  EXAM: ACUTE ABDOMEN SERIES (ABDOMEN 2 VIEW & CHEST 1 VIEW)  COMPARISON:  None.  FINDINGS: The left lower decubitus image demonstrates lucency in the right upper abdomen and most compatible with  free air. Chest radiograph demonstrates very low lung volumes. Prominent interstitial markings are probably related to the low lung volumes. Heart size is within normal limits. Small amount of bowel gas in the abdomen. There is stool along the right side of the colon.  IMPRESSION: Study is positive for free air. Findings are concerning for a bowel perforation and recommend surgical consultation.  Low lung volumes.  Critical Value/emergent results were called by telephone at the time of interpretation on 08/28/2013 at 6:27 PM to Dr. Trixie Dredge , who verbally acknowledged these results.   Electronically Signed   By: Richarda Overlie M.D.   On: 08/28/2013 18:28     ASSESSMENT / PLAN:  PULMONARY A: Acute respiratory failure P:   - ABG now, decrease FiO2 to 50%, rate to 20, PEEP 5, TV 8cc/kg, recheck ABG in one hour - CXR for placement - VAP prevention  CARDIOVASCULAR A: Tachycardic, Borderline hypotension P:  - 1L NS bolus - IV @ 200cc - Insert central line - Wean from propofol, fentanyl for  sedation  RENAL A:  Hypokalemic, Hypocalcemic  P:   - Recheck K, mag, Ca stat, replace as necessary  GASTROINTESTINAL A:  Perforated Gastric ulcer s/p repair P:   - NPO, delay feedings - PPI - Drain still in place from surgery  HEMATOLOGIC A:  Increased DVT risk P:  - SCDs - Lovenox  INFECTIOUS A:  Perotonitis P:   - IV Vancomycin - IV Zosyn   ENDOCRINE A:  Diabetes Mellitus Type 2 P:   -CBG q4 -SSI - Check Cortisol level  NEUROLOGIC A:  Sedated post op P:   - Goal RASS -1 - Sedation with fentanyl, wean off propofol   I have personally obtained a history, examined the patient, evaluated laboratory and imaging results, formulated the assessment and plan and placed orders. CRITICAL CARE: The patient is critically ill with multiple organ systems failure and requires high complexity decision making for assessment and support, frequent evaluation and titration of therapies, application of advanced monitoring technologies and extensive interpretation of multiple databases. Critical Care Time devoted to patient care services described in this note is   minutes.   Carlynn Purl, DO PGY-1 Internal Medicine Pager [phone] Pulmonary and Critical Care Medicine [facility] Pager: [phone]  08/28/2013, 11:49 PM

## 2013-08-29 ENCOUNTER — Inpatient Hospital Stay (HOSPITAL_COMMUNITY): Payer: Medicaid Other

## 2013-08-29 DIAGNOSIS — I517 Cardiomegaly: Secondary | ICD-10-CM

## 2013-08-29 DIAGNOSIS — E440 Moderate protein-calorie malnutrition: Secondary | ICD-10-CM | POA: Diagnosis present

## 2013-08-29 DIAGNOSIS — R6521 Severe sepsis with septic shock: Secondary | ICD-10-CM

## 2013-08-29 DIAGNOSIS — R6520 Severe sepsis without septic shock: Secondary | ICD-10-CM | POA: Diagnosis present

## 2013-08-29 DIAGNOSIS — A419 Sepsis, unspecified organism: Secondary | ICD-10-CM | POA: Diagnosis present

## 2013-08-29 DIAGNOSIS — I4891 Unspecified atrial fibrillation: Secondary | ICD-10-CM

## 2013-08-29 DIAGNOSIS — J80 Acute respiratory distress syndrome: Secondary | ICD-10-CM | POA: Diagnosis present

## 2013-08-29 LAB — CBC
HCT: 48.5 % (ref 39.0–52.0)
HEMATOCRIT: 43.5 % (ref 39.0–52.0)
HEMOGLOBIN: 16.4 g/dL (ref 13.0–17.0)
HEMOGLOBIN: 14.9 g/dL (ref 13.0–17.0)
MCH: 31.1 pg (ref 26.0–34.0)
MCH: 30.7 pg (ref 26.0–34.0)
MCHC: 33.8 g/dL (ref 30.0–36.0)
MCHC: 34.3 g/dL (ref 30.0–36.0)
MCV: 91.9 fL (ref 78.0–100.0)
MCV: 89.7 fL (ref 78.0–100.0)
Platelets: 169 10*3/uL (ref 150–400)
Platelets: 197 10*3/uL (ref 150–400)
RBC: 5.28 MIL/uL (ref 4.22–5.81)
RBC: 4.85 MIL/uL (ref 4.22–5.81)
RDW: 13.4 % (ref 11.5–15.5)
RDW: 13.6 % (ref 11.5–15.5)
WBC: 1.6 10*3/uL — ABNORMAL LOW (ref 4.0–10.5)
WBC: 4.7 10*3/uL (ref 4.0–10.5)

## 2013-08-29 LAB — COMPREHENSIVE METABOLIC PANEL
ALK PHOS: 27 U/L — AB (ref 39–117)
ALT: 41 U/L (ref 0–53)
AST: 56 U/L — AB (ref 0–37)
Albumin: 2.4 g/dL — ABNORMAL LOW (ref 3.5–5.2)
BUN: 24 mg/dL — AB (ref 6–23)
CO2: 18 mEq/L — ABNORMAL LOW (ref 19–32)
Calcium: 5.6 mg/dL — CL (ref 8.4–10.5)
Chloride: 112 mEq/L (ref 96–112)
Creatinine, Ser: 1.07 mg/dL (ref 0.50–1.35)
GFR calc Af Amer: >90 mL/min (ref >90)
GFR calc non Af Amer: 80 mL/min — ABNORMAL LOW (ref >90)
Glucose, Bld: 239 mg/dL — ABNORMAL HIGH (ref 70–99)
POTASSIUM: 3.7 meq/L (ref 3.7–5.3)
SODIUM: 146 meq/L (ref 137–147)
TOTAL PROTEIN: 4 g/dL — AB (ref 6.0–8.3)
Total Bilirubin: 0.4 mg/dL (ref 0.3–1.2)

## 2013-08-29 LAB — POCT I-STAT 3, ART BLOOD GAS (G3+)
ACID-BASE DEFICIT: 2 mmol/L (ref 0.0–2.0)
ACID-BASE DEFICIT: 4 mmol/L — AB (ref 0.0–2.0)
ACID-BASE DEFICIT: 7 mmol/L — AB (ref 0.0–2.0)
Acid-base deficit: 9 mmol/L — ABNORMAL HIGH (ref 0.0–2.0)
Acid-base deficit: 11 mmol/L — ABNORMAL HIGH (ref 0.0–2.0)
Acid-base deficit: 2 mmol/L (ref 0.0–2.0)
Acid-base deficit: 6 mmol/L — ABNORMAL HIGH (ref 0.0–2.0)
Acid-base deficit: 7 mmol/L — ABNORMAL HIGH (ref 0.0–2.0)
BICARBONATE: 18 meq/L — AB (ref 20.0–24.0)
BICARBONATE: 17 meq/L — AB (ref 20.0–24.0)
Bicarbonate: 19.4 mEq/L — ABNORMAL LOW (ref 20.0–24.0)
Bicarbonate: 19.6 mEq/L — ABNORMAL LOW (ref 20.0–24.0)
Bicarbonate: 19.9 mEq/L — ABNORMAL LOW (ref 20.0–24.0)
Bicarbonate: 23.1 mEq/L (ref 20.0–24.0)
Bicarbonate: 23.3 mEq/L (ref 20.0–24.0)
Bicarbonate: 24.3 mEq/L — ABNORMAL HIGH (ref 20.0–24.0)
O2 SAT: 94 %
O2 SAT: 89 %
O2 Saturation: 100 %
O2 Saturation: 90 %
O2 Saturation: 90 %
O2 Saturation: 91 %
O2 Saturation: 91 %
O2 Saturation: 95 %
PCO2 ART: 49.5 mmHg — AB (ref 35.0–45.0)
PCO2 ART: 42.4 mmHg (ref 35.0–45.0)
PH ART: 7.303 — AB (ref 7.350–7.450)
Patient temperature: 100
Patient temperature: 100.1
Patient temperature: 99.8
TCO2: 18 mmol/L (ref 0–100)
TCO2: 19 mmol/L (ref 0–100)
TCO2: 21 mmol/L (ref 0–100)
TCO2: 21 mmol/L (ref 0–100)
TCO2: 21 mmol/L (ref 0–100)
TCO2: 24 mmol/L (ref 0–100)
TCO2: 25 mmol/L (ref 0–100)
TCO2: 26 mmol/L (ref 0–100)
pCO2 arterial: 40.5 mmHg (ref 35.0–45.0)
pCO2 arterial: 41.6 mmHg (ref 35.0–45.0)
pCO2 arterial: 42.8 mmHg (ref 35.0–45.0)
pCO2 arterial: 43 mmHg (ref 35.0–45.0)
pCO2 arterial: 44.8 mmHg (ref 35.0–45.0)
pCO2 arterial: 49.1 mmHg — ABNORMAL HIGH (ref 35.0–45.0)
pH, Arterial: 7.191 — CL (ref 7.350–7.450)
pH, Arterial: 7.243 — ABNORMAL LOW (ref 7.350–7.450)
pH, Arterial: 7.276 — ABNORMAL LOW (ref 7.350–7.450)
pH, Arterial: 7.283 — ABNORMAL LOW (ref 7.350–7.450)
pH, Arterial: 7.284 — ABNORMAL LOW (ref 7.350–7.450)
pH, Arterial: 7.289 — ABNORMAL LOW (ref 7.350–7.450)
pH, Arterial: 7.342 — ABNORMAL LOW (ref 7.350–7.450)
pO2, Arterial: 251 mmHg — ABNORMAL HIGH (ref 80.0–100.0)
pO2, Arterial: 64 mmHg — ABNORMAL LOW (ref 80.0–100.0)
pO2, Arterial: 66 mmHg — ABNORMAL LOW (ref 80.0–100.0)
pO2, Arterial: 68 mmHg — ABNORMAL LOW (ref 80.0–100.0)
pO2, Arterial: 69 mmHg — ABNORMAL LOW (ref 80.0–100.0)
pO2, Arterial: 71 mmHg — ABNORMAL LOW (ref 80.0–100.0)
pO2, Arterial: 82 mmHg (ref 80.0–100.0)
pO2, Arterial: 94 mmHg (ref 80.0–100.0)

## 2013-08-29 LAB — CBC WITH DIFFERENTIAL/PLATELET
Basophils Absolute: 0 10*3/uL (ref 0.0–0.1)
Basophils Relative: 1 % (ref 0–1)
Eosinophils Absolute: 0 10*3/uL (ref 0.0–0.7)
Eosinophils Relative: 1 % (ref 0–5)
HEMATOCRIT: 48.7 % (ref 39.0–52.0)
Hemoglobin: 16.8 g/dL (ref 13.0–17.0)
Lymphocytes Relative: 26 % (ref 12–46)
Lymphs Abs: 0.5 10*3/uL — ABNORMAL LOW (ref 0.7–4.0)
MCH: 31.5 pg (ref 26.0–34.0)
MCHC: 34.5 g/dL (ref 30.0–36.0)
MCV: 91.4 fL (ref 78.0–100.0)
MONO ABS: 0.2 10*3/uL (ref 0.1–1.0)
MONOS PCT: 11 % (ref 3–12)
NEUTROS ABS: 1.3 10*3/uL — AB (ref 1.7–7.7)
NEUTROS PCT: 61 % (ref 43–77)
Platelets: 176 10*3/uL (ref 150–400)
RBC: 5.33 MIL/uL (ref 4.22–5.81)
RDW: 13.4 % (ref 11.5–15.5)
WBC: 2.1 10*3/uL — ABNORMAL LOW (ref 4.0–10.5)

## 2013-08-29 LAB — CORTISOL: Cortisol, Plasma: 39.5 ug/dL

## 2013-08-29 LAB — GLUCOSE, CAPILLARY
GLUCOSE-CAPILLARY: 179 mg/dL — AB (ref 70–99)
GLUCOSE-CAPILLARY: 173 mg/dL — AB (ref 70–99)
GLUCOSE-CAPILLARY: 114 mg/dL — AB (ref 70–99)
GLUCOSE-CAPILLARY: 138 mg/dL — AB (ref 70–99)
Glucose-Capillary: 151 mg/dL — ABNORMAL HIGH (ref 70–99)
Glucose-Capillary: 106 mg/dL — ABNORMAL HIGH (ref 70–99)

## 2013-08-29 LAB — URINALYSIS, ROUTINE W REFLEX MICROSCOPIC
Bilirubin Urine: NEGATIVE
GLUCOSE, UA: NEGATIVE mg/dL
Ketones, ur: NEGATIVE mg/dL
LEUKOCYTES UA: NEGATIVE
NITRITE: NEGATIVE
PH: 5.5 (ref 5.0–8.0)
Protein, ur: 30 mg/dL — AB
SPECIFIC GRAVITY, URINE: 1.034 — AB (ref 1.005–1.030)
Urobilinogen, UA: 0.2 mg/dL (ref 0.0–1.0)

## 2013-08-29 LAB — BASIC METABOLIC PANEL
BUN: 24 mg/dL — AB (ref 6–23)
BUN: 29 mg/dL — AB (ref 6–23)
CALCIUM: 5.9 mg/dL — AB (ref 8.4–10.5)
CO2: 24 mEq/L (ref 19–32)
CO2: 25 mEq/L (ref 19–32)
CREATININE: 1.01 mg/dL (ref 0.50–1.35)
Calcium: 9.5 mg/dL (ref 8.4–10.5)
Chloride: 109 mEq/L (ref 96–112)
Chloride: 112 mEq/L (ref 96–112)
Creatinine, Ser: 1.42 mg/dL — ABNORMAL HIGH (ref 0.50–1.35)
GFR calc Af Amer: 66 mL/min — ABNORMAL LOW (ref >90)
GFR calc non Af Amer: 57 mL/min — ABNORMAL LOW (ref >90)
GFR, EST NON AFRICAN AMERICAN: 86 mL/min — AB (ref >90)
GLUCOSE: 124 mg/dL — AB (ref 70–99)
Glucose, Bld: 168 mg/dL — ABNORMAL HIGH (ref 70–99)
POTASSIUM: 3.9 meq/L (ref 3.7–5.3)
Potassium: 3.5 mEq/L — ABNORMAL LOW (ref 3.7–5.3)
Sodium: 146 mEq/L (ref 137–147)
Sodium: 146 mEq/L (ref 137–147)

## 2013-08-29 LAB — CARBOXYHEMOGLOBIN
CARBOXYHEMOGLOBIN: 0.7 % (ref 0.5–1.5)
METHEMOGLOBIN: 0.6 % (ref 0.0–1.5)
O2 Saturation: 72.1 %
TOTAL HEMOGLOBIN: 14.1 g/dL (ref 13.5–18.0)

## 2013-08-29 LAB — LACTIC ACID, PLASMA
LACTIC ACID, VENOUS: 4.1 mmol/L — AB (ref 0.5–2.2)
Lactic Acid, Venous: 4.1 mmol/L — ABNORMAL HIGH (ref 0.5–2.2)
Lactic Acid, Venous: 3.7 mmol/L — ABNORMAL HIGH (ref 0.5–2.2)

## 2013-08-29 LAB — PROCALCITONIN: PROCALCITONIN: 91.57 ng/mL

## 2013-08-29 LAB — URINE MICROSCOPIC-ADD ON

## 2013-08-29 LAB — LIPASE, BLOOD: Lipase: 14 U/L (ref 11–59)

## 2013-08-29 LAB — MAGNESIUM
MAGNESIUM: 1.6 mg/dL (ref 1.5–2.5)
Magnesium: 1.5 mg/dL (ref 1.5–2.5)

## 2013-08-29 LAB — PHOSPHORUS: Phosphorus: 1.9 mg/dL — ABNORMAL LOW (ref 2.3–4.6)

## 2013-08-29 LAB — MRSA PCR SCREENING: MRSA BY PCR: NEGATIVE

## 2013-08-29 LAB — TROPONIN I: Troponin I: <0.3 ng/mL (ref <0.30)

## 2013-08-29 MED ORDER — HYDROCORTISONE SOD SUCCINATE 100 MG IJ SOLR
50.0000 mg | Freq: Four times a day (QID) | INTRAMUSCULAR | Status: DC
  Administered 2013-08-29 – 2013-08-30 (×2): 50 mg via INTRAVENOUS
  Filled 2013-08-29 (×7): qty 1

## 2013-08-29 MED ORDER — SODIUM BICARBONATE 8.4 % IV SOLN
INTRAVENOUS | Status: AC
  Administered 2013-08-29: 50 meq
  Filled 2013-08-29: qty 50

## 2013-08-29 MED ORDER — SODIUM CHLORIDE 0.9 % IV BOLUS (SEPSIS)
1000.0000 mL | Freq: Once | INTRAVENOUS | Status: AC
  Administered 2013-08-29: 1000 mL via INTRAVENOUS

## 2013-08-29 MED ORDER — SODIUM CHLORIDE 0.9 % IV SOLN
1.0000 mg/h | INTRAVENOUS | Status: DC
  Administered 2013-08-29 (×2): 2 mg/h via INTRAVENOUS
  Administered 2013-08-31: 3 mg/h via INTRAVENOUS
  Administered 2013-09-01: 4 mg/h via INTRAVENOUS
  Filled 2013-08-29 (×5): qty 10

## 2013-08-29 MED ORDER — SODIUM CHLORIDE 0.9 % IV SOLN
100.0000 mg | INTRAVENOUS | Status: DC
  Administered 2013-08-29 – 2013-09-09 (×12): 100 mg via INTRAVENOUS
  Filled 2013-08-29 (×14): qty 100

## 2013-08-29 MED ORDER — AMIODARONE HCL IN DEXTROSE 360-4.14 MG/200ML-% IV SOLN
60.0000 mg/h | INTRAVENOUS | Status: AC
  Administered 2013-08-29: 60 mg/h via INTRAVENOUS
  Filled 2013-08-29 (×2): qty 200

## 2013-08-29 MED ORDER — M.V.I. ADULT IV INJ
INTRAVENOUS | Status: AC
  Administered 2013-08-29: 18:00:00 via INTRAVENOUS
  Filled 2013-08-29: qty 1000

## 2013-08-29 MED ORDER — MIDAZOLAM HCL 2 MG/2ML IJ SOLN
INTRAMUSCULAR | Status: AC
  Administered 2013-08-29: 01:00:00 2 mg via INTRAVENOUS
  Filled 2013-08-29: qty 2

## 2013-08-29 MED ORDER — AMIODARONE HCL IN DEXTROSE 360-4.14 MG/200ML-% IV SOLN
INTRAVENOUS | Status: AC
  Administered 2013-08-29: 200 mL via INTRAVENOUS
  Filled 2013-08-29: qty 200

## 2013-08-29 MED ORDER — HEPARIN SODIUM (PORCINE) 5000 UNIT/ML IJ SOLN
5000.0000 [IU] | Freq: Three times a day (TID) | INTRAMUSCULAR | Status: DC
  Administered 2013-08-29 – 2013-09-02 (×12): 5000 [IU] via SUBCUTANEOUS
  Filled 2013-08-29 (×16): qty 1

## 2013-08-29 MED ORDER — VASOPRESSIN 20 UNIT/ML IJ SOLN
0.0300 [IU]/min | INTRAVENOUS | Status: DC
  Administered 2013-08-29 – 2013-08-31 (×3): 0.03 [IU]/min via INTRAVENOUS
  Filled 2013-08-29 (×3): qty 2.5

## 2013-08-29 MED ORDER — BIOTENE DRY MOUTH MT LIQD
15.0000 mL | OROMUCOSAL | Status: DC
  Administered 2013-08-29 – 2013-09-03 (×32): 15 mL via OROMUCOSAL

## 2013-08-29 MED ORDER — METHYLPREDNISOLONE SODIUM SUCC 125 MG IJ SOLR
INTRAMUSCULAR | Status: AC
  Filled 2013-08-29: qty 2

## 2013-08-29 MED ORDER — AMIODARONE HCL IN DEXTROSE 360-4.14 MG/200ML-% IV SOLN
INTRAVENOUS | Status: AC
  Administered 2013-08-29: 17:00:00 200 mL via INTRAVENOUS
  Filled 2013-08-29: qty 200

## 2013-08-29 MED ORDER — FAT EMULSION 20 % IV EMUL
250.0000 mL | INTRAVENOUS | Status: AC
  Administered 2013-08-29: 250 mL via INTRAVENOUS
  Filled 2013-08-29: qty 250

## 2013-08-29 MED ORDER — SODIUM BICARBONATE 8.4 % IV SOLN
100.0000 meq | Freq: Once | INTRAVENOUS | Status: AC
  Administered 2013-08-29: 100 meq via INTRAVENOUS
  Filled 2013-08-29: qty 100

## 2013-08-29 MED ORDER — MAGNESIUM SULFATE 40 MG/ML IJ SOLN
2.0000 g | Freq: Once | INTRAMUSCULAR | Status: AC
  Administered 2013-08-29: 2 g via INTRAVENOUS
  Filled 2013-08-29: qty 50

## 2013-08-29 MED ORDER — SODIUM CHLORIDE 0.9 % IV SOLN
1.0000 g | Freq: Once | INTRAVENOUS | Status: AC
  Administered 2013-08-29: 1 g via INTRAVENOUS
  Filled 2013-08-29: qty 10

## 2013-08-29 MED ORDER — CALCIUM CHLORIDE 10 % IV SOLN
1.0000 g | Freq: Once | INTRAVENOUS | Status: AC
  Administered 2013-08-29: 1 g via INTRAVENOUS

## 2013-08-29 MED ORDER — MIDAZOLAM HCL 2 MG/2ML IJ SOLN
2.0000 mg | Freq: Once | INTRAMUSCULAR | Status: AC
  Administered 2013-08-29: 2 mg via INTRAVENOUS

## 2013-08-29 MED ORDER — SODIUM CHLORIDE 0.9 % IV SOLN
INTRAVENOUS | Status: DC
  Administered 2013-08-29: 30 mL/h via INTRAVENOUS
  Administered 2013-08-30: 08:00:00 via INTRAVENOUS
  Administered 2013-08-31: 125 mL/h via INTRAVENOUS
  Administered 2013-09-01: 13:00:00 via INTRAVENOUS
  Administered 2013-09-02: 125 mL/h via INTRAVENOUS
  Administered 2013-09-02 – 2013-09-12 (×6): via INTRAVENOUS

## 2013-08-29 MED ORDER — VASOPRESSIN 20 UNIT/ML IJ SOLN
0.0300 [IU]/min | INTRAVENOUS | Status: DC
  Filled 2013-08-29: qty 2.5

## 2013-08-29 MED ORDER — AMIODARONE LOAD VIA INFUSION
300.0000 mg | Freq: Once | INTRAVENOUS | Status: AC
  Administered 2013-08-29: 300 mg via INTRAVENOUS
  Filled 2013-08-29: qty 166.67

## 2013-08-29 MED ORDER — PHENYLEPHRINE HCL 10 MG/ML IJ SOLN
30.0000 ug/min | INTRAVENOUS | Status: DC
  Administered 2013-08-29: 5 ug/min via INTRAVENOUS
  Filled 2013-08-29 (×2): qty 1

## 2013-08-29 MED ORDER — SODIUM BICARBONATE 8.4 % IV SOLN
INTRAVENOUS | Status: AC
  Filled 2013-08-29: qty 100

## 2013-08-29 MED ORDER — SODIUM CHLORIDE 0.9 % IV SOLN
25.0000 ug/h | INTRAVENOUS | Status: DC
  Administered 2013-08-29: 200 ug/h via INTRAVENOUS
  Administered 2013-08-29: 150 ug/h via INTRAVENOUS
  Administered 2013-08-29: 75 ug/h via INTRAVENOUS
  Administered 2013-08-30 – 2013-09-01 (×3): 200 ug/h via INTRAVENOUS
  Administered 2013-09-02: 100 ug/h via INTRAVENOUS
  Administered 2013-09-02: 125 ug/h via INTRAVENOUS
  Administered 2013-09-03: 400 ug/h via INTRAVENOUS
  Administered 2013-09-03: 250 ug/h via INTRAVENOUS
  Administered 2013-09-04: 300 ug/h via INTRAVENOUS
  Administered 2013-09-04: 350 ug/h via INTRAVENOUS
  Administered 2013-09-04: 250 ug/h via INTRAVENOUS
  Administered 2013-09-06: 300 ug/h via INTRAVENOUS
  Administered 2013-09-06: 350 ug/h via INTRAVENOUS
  Administered 2013-09-06: 300 ug/h via INTRAVENOUS
  Administered 2013-09-07 – 2013-09-08 (×5): 400 ug/h via INTRAVENOUS
  Filled 2013-08-29 (×27): qty 50

## 2013-08-29 MED ORDER — PHENYLEPHRINE HCL 10 MG/ML IJ SOLN
30.0000 ug/min | INTRAVENOUS | Status: DC
  Administered 2013-08-29: 150 ug/min via INTRAVENOUS
  Administered 2013-08-29: 75 ug/min via INTRAVENOUS
  Administered 2013-08-29 (×2): 200 ug/min via INTRAVENOUS
  Administered 2013-08-29: 50 ug/min via INTRAVENOUS
  Administered 2013-08-30: 175 ug/min via INTRAVENOUS
  Administered 2013-08-30 (×2): 200 ug/min via INTRAVENOUS
  Administered 2013-08-31: 50 ug/min via INTRAVENOUS
  Filled 2013-08-29 (×12): qty 4

## 2013-08-29 MED ORDER — DEXTROSE 5 % IV SOLN
2.0000 ug/min | INTRAVENOUS | Status: DC
  Administered 2013-08-30 (×2): 35 ug/min via INTRAVENOUS
  Administered 2013-08-31: 34 ug/min via INTRAVENOUS
  Administered 2013-08-31: 30 ug/min via INTRAVENOUS
  Administered 2013-08-31: 32 ug/min via INTRAVENOUS
  Filled 2013-08-29 (×8): qty 16

## 2013-08-29 MED ORDER — DEXTROSE 5 % IV SOLN
0.5000 ug/min | INTRAVENOUS | Status: DC
  Administered 2013-08-29: 0.5 ug/min via INTRAVENOUS
  Filled 2013-08-29 (×2): qty 1

## 2013-08-29 MED ORDER — SODIUM CHLORIDE 0.9 % IV SOLN: 1.0000 g | Freq: Once | INTRAVENOUS | Status: DC

## 2013-08-29 MED ORDER — NOREPINEPHRINE BITARTRATE 1 MG/ML IJ SOLN
2.0000 ug/min | INTRAVENOUS | Status: DC
  Administered 2013-08-29: 15 ug/min via INTRAVENOUS
  Administered 2013-08-29: 30 ug/min via INTRAVENOUS
  Administered 2013-08-29: 35 ug/min via INTRAVENOUS
  Filled 2013-08-29 (×7): qty 8

## 2013-08-29 MED ORDER — SODIUM BICARBONATE 8.4 % IV SOLN
INTRAVENOUS | Status: DC
  Administered 2013-08-29 – 2013-08-30 (×2): via INTRAVENOUS
  Filled 2013-08-29 (×4): qty 150

## 2013-08-29 MED ORDER — CHLORHEXIDINE GLUCONATE 0.12 % MT SOLN
15.0000 mL | Freq: Two times a day (BID) | OROMUCOSAL | Status: DC
  Administered 2013-08-29 – 2013-09-13 (×30): 15 mL via OROMUCOSAL
  Filled 2013-08-29 (×31): qty 15

## 2013-08-29 MED ORDER — SODIUM PHOSPHATE 3 MMOLE/ML IV SOLN
20.0000 mmol | Freq: Once | INTRAVENOUS | Status: AC
  Administered 2013-08-29: 20 mmol via INTRAVENOUS
  Filled 2013-08-29: qty 6.67

## 2013-08-29 NOTE — Progress Notes (Addendum)
Pt with heart rate 180 sbp decreased 70's  Dr Vassie LollAlva contacted orders received Dr Delford FieldWright in and attempt to cardiovert with 120 joules. Dr Gala RomneyBensimhon in room bolus with 300 amiodarone and gtt started NS 1 liter given per Dr Reginia NaasAlva's orders

## 2013-08-29 NOTE — Progress Notes (Signed)
CRITICAL VALUE ALERT  Critical value received:  Ca 5.9 (low panic)  Nurse who received alert:  Roselie AwkwardShannon Sohrab Keelan, RN  MD notified (1st page):  Dr. Marin ShutterZubelevitskiy  Time of first page:  0335  MD notified (2nd page):  Time of second page:  Responding MD:  Dr. Marin ShutterZubelevitskiy  Time MD responded:  [phone]

## 2013-08-29 NOTE — Progress Notes (Signed)
Called Dr. Marin ShutterZubelevitskiy about pt's HR 130s-140s, bicarb drop on ABG (23 to 19), lactic acid 4.1, CVP reading of 5, and O2 sats 88% on 100% vent.  Orders received for saline bolus and increase of PEEP to 10.  Will continue to monitor.  Roselie AwkwardShannon Ashland Wiseman, RN

## 2013-08-29 NOTE — Progress Notes (Signed)
eLink Physician-Brief Progress Note Patient Name: James LeydenChristopher D Bewick DOB: 05/06/1966 MRN: [id number]  Date of Service  08/29/2013   HPI/Events of Note   Hypotensive inspite of max pressors CVP4  eICU Interventions  Fluid bolus, goal cvp 10 Correct acidossis with bicarb   Intervention Category Major Interventions: Acid-Base disturbance - evaluation and management Intermediate Interventions: Hypotension - evaluation and management  Greely Atiyeh V. 08/29/2013, 3:22 PM

## 2013-08-29 NOTE — Progress Notes (Signed)
eLink Physician-Brief Progress Note Patient Name: Pamalee LeydenChristopher D Ramnath DOB: 05/21/1966 MRN: [id number]  Date of Service  08/29/2013   HPI/Events of Note   Persistent shock Acidosis corrected  eICU Interventions  Start epi gtt Ct bicarb Converted to nsr with amio gtt Will D/w surgery   Intervention Category Intermediate Interventions: Hypotension - evaluation and management  Itzamar Traynor V. 08/29/2013, 6:19 PM

## 2013-08-29 NOTE — Consult Note (Addendum)
Referring Physician: Dr. Delford Field Reason for Consultation: Post-op AF   HPI:  James Mccormick is a 48 year old male with HTN and GERD who presented with a perforated stomach ulcer due to NSAID use who underwent exploratory laparotomy and repair of ulcer on 08/28/13. Has had persistent peritonitis and shock. Remained on ventilator with acidosis and high dose pressor support.  Today while being turned developed AF with RVR with rates up to 200 bpm and worsening hypotension. Underwent attempted DC-CV by CCM but was unsuccessful. Amio now being loaded. SBP ~80.    Review of Systems: unavailable due to intubation   Past Medical History  Diagnosis Date  . GERD (gastroesophageal reflux disease)   . Hypertension     Medications Prior to Admission  Medication Sig Dispense Refill  . sertraline (ZOLOFT) 50 MG tablet Take 50 mg by mouth 3 (three) times daily.      . valsartan (DIOVAN) 320 MG tablet Take 320 mg by mouth daily.         Marland Kitchen antiseptic oral rinse  15 mL Mouth Rinse Q4H  . chlorhexidine  15 mL Mouth/Throat BID  . heparin subcutaneous  5,000 Units Subcutaneous Q8H  . insulin aspart  1-3 Units Subcutaneous Q4H  . pantoprazole (PROTONIX) IV  40 mg Intravenous Q12H  . piperacillin-tazobactam (ZOSYN)  IV  3.375 g Intravenous Q8H  . vancomycin  1,000 mg Intravenous Q8H    Infusions: . sodium chloride 30 mL/hr (08/29/13 1202)  . amiodarone (NEXTERONE PREMIX) 360 mg/200 mL dextrose 60 mg/hr (08/29/13 1815)  . epinephrine 0.5 mcg/min (08/29/13 1829)  . Marland KitchenTPN (CLINIMIX-E) Adult 40 mL/hr at 08/29/13 1754   And  . fat emulsion 250 mL (08/29/13 1754)  . fentaNYL infusion INTRAVENOUS 200 mcg/hr (08/29/13 1600)  . midazolam (VERSED) infusion 2 mg/hr (08/29/13 1700)  . norepinephrine (LEVOPHED) Adult infusion 30 mcg/min (08/29/13 1700)  . phenylephrine (NEO-SYNEPHRINE) Adult infusion 200 mcg/min (08/29/13 1700)  .  sodium bicarbonate  infusion 1000 mL 75 mL/hr at 08/29/13 1550  .  vasopressin (PITRESSIN) infusion - *FOR SHOCK* 0.03 Units/min (08/29/13 1700)    No Known Allergies  History   Social History  . Marital Status: Divorced    Spouse Name: N/A    Number of Children: N/A  . Years of Education: N/A   Occupational History  . Not on file.   Social History Main Topics  . Smoking status: Current Every Day Smoker -- 1.00 packs/day    Types: Cigarettes  . Smokeless tobacco: Not on file  . Alcohol Use: No  . Drug Use: No  . Sexual Activity: Not on file   Other Topics Concern  . Not on file   Social History Narrative  . No narrative on file    No family history on file.  PHYSICAL EXAM: SBP 80/50 HR 170s  On ventilator  Intake/Output Summary (Last 24 hours) at 08/29/13 1849 Last data filed at 08/29/13 1815  Gross per 24 hour  Intake 8505.36 ml  Output   2540 ml  Net 5965.36 ml    General:  Critically ill appearing on vent. sedated HEENT: normal x for ET tube Neck: supple. no JVD. + central line Carotids 2+ bilat; no bruits. No lymphadenopathy or thryomegaly appreciated. Cor: PMI nondisplaced. Distant HS. Tachy irregular Lungs: mild rhonchi Abdomen: post-op with dressings. Distended. quiet Extremities: no cyanosis, clubbing, rash, edema + foley + scds Neuro: intubated sedated.  ECG: AF 177 No ST-T wave abnormalities.    Results for  orders placed during the hospital encounter of 08/28/13 (from the past 24 hour(s))  GLUCOSE, CAPILLARY     Status: Abnormal   Collection Time    08/28/13 11:20 PM      Result Value Range   Glucose-Capillary 151 (*) 70 - 99 mg/dL  POCT I-STAT 3, BLOOD GAS (G3+)     Status: Abnormal   Collection Time    08/28/13 11:57 PM      Result Value Range   pH, Arterial 7.284 (*) 7.350 - 7.450   pCO2 arterial 49.1 (*) 35.0 - 45.0 mmHg   pO2, Arterial 251.0 (*) 80.0 - 100.0 mmHg   Bicarbonate 23.3  20.0 - 24.0 mEq/L   TCO2 25  0 - 100 mmol/L   O2 Saturation 100.0     Acid-base deficit 4.0 (*) 0.0 - 2.0 mmol/L     Patient temperature 98.5 F     Collection site RADIAL, ALLEN'S TEST ACCEPTABLE     Drawn by RT     Sample type ARTERIAL    CBC WITH DIFFERENTIAL     Status: Abnormal   Collection Time    08/28/13 11:59 PM      Result Value Range   WBC 2.1 (*) 4.0 - 10.5 K/uL   RBC 5.33  4.22 - 5.81 MIL/uL   Hemoglobin 16.8  13.0 - 17.0 g/dL   HCT 16.1  09.6 - 04.5 %   MCV 91.4  78.0 - 100.0 fL   MCH 31.5  26.0 - 34.0 pg   MCHC 34.5  30.0 - 36.0 g/dL   RDW 40.9  81.1 - 91.4 %   Platelets 176  150 - 400 K/uL   Neutrophils Relative % 61  43 - 77 %   Neutro Abs 1.3 (*) 1.7 - 7.7 K/uL   Lymphocytes Relative 26  12 - 46 %   Lymphs Abs 0.5 (*) 0.7 - 4.0 K/uL   Monocytes Relative 11  3 - 12 %   Monocytes Absolute 0.2  0.1 - 1.0 K/uL   Eosinophils Relative 1  0 - 5 %   Eosinophils Absolute 0.0  0.0 - 0.7 K/uL   Basophils Relative 1  0 - 1 %   Basophils Absolute 0.0  0.0 - 0.1 K/uL  LACTIC ACID, PLASMA     Status: Abnormal   Collection Time    08/29/13  1:02 AM      Result Value Range   Lactic Acid, Venous 4.1 (*) 0.5 - 2.2 mmol/L  MAGNESIUM     Status: None   Collection Time    08/29/13  1:02 AM      Result Value Range   Magnesium 1.5  1.5 - 2.5 mg/dL  BASIC METABOLIC PANEL     Status: Abnormal   Collection Time    08/29/13  1:02 AM      Result Value Range   Sodium 146  137 - 147 mEq/L   Potassium 3.5 (*) 3.7 - 5.3 mEq/L   Chloride 109  96 - 112 mEq/L   CO2 24  19 - 32 mEq/L   Glucose, Bld 168 (*) 70 - 99 mg/dL   BUN 24 (*) 6 - 23 mg/dL   Creatinine, Ser 7.82  0.50 - 1.35 mg/dL   Calcium 5.9 (*) 8.4 - 10.5 mg/dL   GFR calc non Af Amer 86 (*) >90 mL/min   GFR calc Af Amer >90  >90 mL/min  CORTISOL     Status: None   Collection Time  08/29/13  1:20 AM      Result Value Range   Cortisol, Plasma 39.5    POCT I-STAT 3, BLOOD GAS (G3+)     Status: Abnormal   Collection Time    08/29/13  1:56 AM      Result Value Range   pH, Arterial 7.289 (*) 7.350 - 7.450   pCO2 arterial 40.5  35.0  - 45.0 mmHg   pO2, Arterial 64.0 (*) 80.0 - 100.0 mmHg   Bicarbonate 19.4 (*) 20.0 - 24.0 mEq/L   TCO2 21  0 - 100 mmol/L   O2 Saturation 90.0     Acid-base deficit 7.0 (*) 0.0 - 2.0 mmol/L   Patient temperature 98.6 F     Collection site ARTERIAL LINE     Drawn by RT     Sample type ARTERIAL    GLUCOSE, CAPILLARY     Status: Abnormal   Collection Time    08/29/13  4:02 AM      Result Value Range   Glucose-Capillary 179 (*) 70 - 99 mg/dL  POCT I-STAT 3, BLOOD GAS (G3+)     Status: Abnormal   Collection Time    08/29/13  4:10 AM      Result Value Range   pH, Arterial 7.243 (*) 7.350 - 7.450   pCO2 arterial 41.6  35.0 - 45.0 mmHg   pO2, Arterial 82.0  80.0 - 100.0 mmHg   Bicarbonate 18.0 (*) 20.0 - 24.0 mEq/L   TCO2 19  0 - 100 mmol/L   O2 Saturation 94.0     Acid-base deficit 9.0 (*) 0.0 - 2.0 mmol/L   Patient temperature 98.2 F     Collection site ARTERIAL LINE     Drawn by RT     Sample type ARTERIAL    CBC     Status: Abnormal   Collection Time    08/29/13  4:20 AM      Result Value Range   WBC 1.6 (*) 4.0 - 10.5 K/uL   RBC 5.28  4.22 - 5.81 MIL/uL   Hemoglobin 16.4  13.0 - 17.0 g/dL   HCT 16.1  09.6 - 04.5 %   MCV 91.9  78.0 - 100.0 fL   MCH 31.1  26.0 - 34.0 pg   MCHC 33.8  30.0 - 36.0 g/dL   RDW 40.9  81.1 - 91.4 %   Platelets 169  150 - 400 K/uL  COMPREHENSIVE METABOLIC PANEL     Status: Abnormal   Collection Time    08/29/13  4:20 AM      Result Value Range   Sodium 146  137 - 147 mEq/L   Potassium 3.7  3.7 - 5.3 mEq/L   Chloride 112  96 - 112 mEq/L   CO2 18 (*) 19 - 32 mEq/L   Glucose, Bld 239 (*) 70 - 99 mg/dL   BUN 24 (*) 6 - 23 mg/dL   Creatinine, Ser 7.82  0.50 - 1.35 mg/dL   Calcium 5.6 (*) 8.4 - 10.5 mg/dL   Total Protein 4.0 (*) 6.0 - 8.3 g/dL   Albumin 2.4 (*) 3.5 - 5.2 g/dL   AST 56 (*) 0 - 37 U/L   ALT 41  0 - 53 U/L   Alkaline Phosphatase 27 (*) 39 - 117 U/L   Total Bilirubin 0.4  0.3 - 1.2 mg/dL   GFR calc non Af Amer 80 (*) >90 mL/min     GFR calc Af Amer >90  >90 mL/min  MAGNESIUM     Status: None   Collection Time    08/29/13  4:20 AM      Result Value Range   Magnesium 1.6  1.5 - 2.5 mg/dL  PHOSPHORUS     Status: Abnormal   Collection Time    08/29/13  4:20 AM      Result Value Range   Phosphorus 1.9 (*) 2.3 - 4.6 mg/dL  URINALYSIS, ROUTINE W REFLEX MICROSCOPIC     Status: Abnormal   Collection Time    08/29/13  4:58 AM      Result Value Range   Color, Urine AMBER (*) YELLOW   APPearance TURBID (*) CLEAR   Specific Gravity, Urine 1.034 (*) 1.005 - 1.030   pH 5.5  5.0 - 8.0   Glucose, UA NEGATIVE  NEGATIVE mg/dL   Hgb urine dipstick MODERATE (*) NEGATIVE   Bilirubin Urine NEGATIVE  NEGATIVE   Ketones, ur NEGATIVE  NEGATIVE mg/dL   Protein, ur 30 (*) NEGATIVE mg/dL   Urobilinogen, UA 0.2  0.0 - 1.0 mg/dL   Nitrite NEGATIVE  NEGATIVE   Leukocytes, UA NEGATIVE  NEGATIVE  URINE MICROSCOPIC-ADD ON     Status: Abnormal   Collection Time    08/29/13  4:58 AM      Result Value Range   Squamous Epithelial / LPF RARE  RARE   RBC / HPF 3-6  <3 RBC/hpf   Bacteria, UA MANY (*) RARE  GLUCOSE, CAPILLARY     Status: Abnormal   Collection Time    08/29/13  7:33 AM      Result Value Range   Glucose-Capillary 173 (*) 70 - 99 mg/dL  MRSA PCR SCREENING     Status: None   Collection Time    08/29/13  7:55 AM      Result Value Range   MRSA by PCR NEGATIVE  NEGATIVE  PROCALCITONIN     Status: None   Collection Time    08/29/13  8:57 AM      Result Value Range   Procalcitonin 91.57    LACTIC ACID, PLASMA     Status: Abnormal   Collection Time    08/29/13  9:30 AM      Result Value Range   Lactic Acid, Venous 3.7 (*) 0.5 - 2.2 mmol/L  LIPASE, BLOOD     Status: None   Collection Time    08/29/13  9:30 AM      Result Value Range   Lipase 14  11 - 59 U/L  GLUCOSE, CAPILLARY     Status: Abnormal   Collection Time    08/29/13 11:13 AM      Result Value Range   Glucose-Capillary 114 (*) 70 - 99 mg/dL  POCT I-STAT  3, BLOOD GAS (G3+)     Status: Abnormal   Collection Time    08/29/13 11:17 AM      Result Value Range   pH, Arterial 7.191 (*) 7.350 - 7.450   pCO2 arterial 44.8  35.0 - 45.0 mmHg   pO2, Arterial 94.0  80.0 - 100.0 mmHg   Bicarbonate 17.0 (*) 20.0 - 24.0 mEq/L   TCO2 18  0 - 100 mmol/L   O2 Saturation 95.0     Acid-base deficit 11.0 (*) 0.0 - 2.0 mmol/L   Patient temperature 100.1 F     Collection site ARTERIAL LINE     Drawn by RT     Sample type ARTERIAL    GLUCOSE, CAPILLARY  Status: Abnormal   Collection Time    08/29/13  3:26 PM      Result Value Range   Glucose-Capillary 106 (*) 70 - 99 mg/dL  POCT I-STAT 3, BLOOD GAS (G3+)     Status: Abnormal   Collection Time    08/29/13  3:57 PM      Result Value Range   pH, Arterial 7.303 (*) 7.350 - 7.450   pCO2 arterial 49.5 (*) 35.0 - 45.0 mmHg   pO2, Arterial 71.0 (*) 80.0 - 100.0 mmHg   Bicarbonate 24.3 (*) 20.0 - 24.0 mEq/L   TCO2 26  0 - 100 mmol/L   O2 Saturation 91.0     Acid-base deficit 2.0  0.0 - 2.0 mmol/L   Patient temperature 99.8 F     Sample type ARTERIAL    CBC     Status: None   Collection Time    08/29/13  4:00 PM      Result Value Range   WBC 4.7  4.0 - 10.5 K/uL   RBC 4.85  4.22 - 5.81 MIL/uL   Hemoglobin 14.9  13.0 - 17.0 g/dL   HCT 16.1  09.6 - 04.5 %   MCV 89.7  78.0 - 100.0 fL   MCH 30.7  26.0 - 34.0 pg   MCHC 34.3  30.0 - 36.0 g/dL   RDW 40.9  81.1 - 91.4 %   Platelets 197  150 - 400 K/uL  BASIC METABOLIC PANEL     Status: Abnormal   Collection Time    08/29/13  4:00 PM      Result Value Range   Sodium 146  137 - 147 mEq/L   Potassium 3.9  3.7 - 5.3 mEq/L   Chloride 112  96 - 112 mEq/L   CO2 25  19 - 32 mEq/L   Glucose, Bld 124 (*) 70 - 99 mg/dL   BUN 29 (*) 6 - 23 mg/dL   Creatinine, Ser 7.82 (*) 0.50 - 1.35 mg/dL   Calcium 9.5  8.4 - 95.6 mg/dL   GFR calc non Af Amer 57 (*) >90 mL/min   GFR calc Af Amer 66 (*) >90 mL/min  POCT I-STAT 3, BLOOD GAS (G3+)     Status: Abnormal    Collection Time    08/29/13  5:37 PM      Result Value Range   pH, Arterial 7.283 (*) 7.350 - 7.450   pCO2 arterial 42.4  35.0 - 45.0 mmHg   pO2, Arterial 66.0 (*) 80.0 - 100.0 mmHg   Bicarbonate 19.9 (*) 20.0 - 24.0 mEq/L   TCO2 21  0 - 100 mmol/L   O2 Saturation 89.0     Acid-base deficit 6.0 (*) 0.0 - 2.0 mmol/L   Patient temperature 99.8 F     Collection site ARTERIAL LINE     Sample type ARTERIAL     Ct Abdomen Pelvis W Contrast  08/28/2013   CLINICAL DATA:  Abdominal pain and pneumoperitoneum.  EXAM: CT ABDOMEN AND PELVIS WITH CONTRAST  TECHNIQUE: Multidetector CT imaging of the abdomen and pelvis was performed using the standard protocol following bolus administration of intravenous contrast.  CONTRAST:  OMNIPAQUE IOHEXOL 300 MG/ML  SOLN  COMPARISON:  Abdominal radiograph 08/28/2013  FINDINGS: There is a small right pleural effusion with extensive atelectasis at the right lung base. Mild atelectasis at the left lung base.  There is pneumoperitoneum with a collection a gas along the anterior abdomen and multiple locules of gas throughout  the upper abdomen. There is a moderate amount of abdominal ascites. Mild distension of the esophagus which contains fluid. There may be wall thickening along the anterior gastric body and there is gas in the gastrohepatic ligament. There is edema and stranding throughout the anterior upper abdomen. There is an air-fluid level within the stomach and some of the fluid appears to have fatty content. No gross abnormality into the duodenum. Mild dilatation of small bowel loops. There is colonic diverticulosis but no evidence to suggest acute colonic inflammation. No gross abnormality to the appendix.  No significant lymphadenopathy. No gross abnormality to the prostate, seminal vesicles or urinary bladder. No gross abnormality to the liver, gallbladder or portal venous system. Normal appearance of the spleen, adrenal glands, kidneys and pancreas. Incidentally,  there are punctate nonobstructive left kidney stones. No acute bone abnormality. Degenerative disc changes in the lower lumbar spine.  IMPRESSION: Study is positive for pneumoperitoneum and free fluid. Findings are suggestive for a perforated viscus. The majority of inflammatory changes and gas are located in the upper abdomen and centered around the stomach. There may be mild wall thickening along the anterior aspect of the stomach.  Colonic diverticulosis but there is not clear evidence for acute colonic inflammation.  Right basilar atelectasis with a small amount a right pleural fluid.  Mild dilatation of small bowel loops could represent an ileus.  Nonobstructive left kidney stones.  Critical Value/emergent results were called by telephone at the time of interpretation on 08/28/2013 at 7:40 PM to Dr. Gaynelle Adu, who verbally acknowledged these results.   Electronically Signed   By: Richarda Overlie M.D.   On: 08/28/2013 19:45   Dg Chest Port 1 View  08/29/2013   CLINICAL DATA:  Exploratory laparotomy.  EXAM: PORTABLE CHEST - 1 VIEW  COMPARISON:  August 29, 2013 1:20 a.m.  FINDINGS: Endotracheal tube, right central venous line, nasogastric tube are stable compared to prior exam. There is increased pulmonary interstitium bilaterally. There is probably a small right pleural effusion. The previously noted atelectasis of the right mid lung is less prominent. The mediastinal contour and heart size are stable.  IMPRESSION: Mild pulmonary vascular congestion. Probable small right pleural effusion.   Electronically Signed   By: Sherian Rein M.D.   On: 08/29/2013 07:11   Dg Chest Port 1 View  08/29/2013   CLINICAL DATA:  Central catheter placement  EXAM: PORTABLE CHEST - 1 VIEW  COMPARISON:  August 28, 2013  FINDINGS: There is a central catheter with tip at the cavoatrial junction. Endotracheal tube tip is 3.6 cm above the carina. Nasogastric tube tip and side port are below the diaphragm in the stomach region. No  pneumothorax.  There is patchy atelectasis in the right mid lung. Lungs are otherwise clear. Heart is upper normal in size with normal pulmonary vascularity. No adenopathy.  IMPRESSION: Tube and catheter positions as described. Note that the new central catheter tip is at the cavoatrial junction. No pneumothorax. Stable right midlung atelectatic change.   Electronically Signed   By: Bretta Bang M.D.   On: 08/29/2013 01:29   Dg Chest Port 1 View  08/29/2013   CLINICAL DATA:  Hypoxia  EXAM: PORTABLE CHEST - 1 VIEW  COMPARISON:  Study obtained earlier in the day  FINDINGS: Endotracheal tube is 3.3 cm above the carina. Nasogastric tube tip and side port are in the stomach. No pneumothorax.  There is atelectatic change in the right mid lung. Lungs are otherwise clear. Heart size and  pulmonary vascularity are normal. No adenopathy.  IMPRESSION: Tube positions as described without pneumothorax. Mild right midlung atelectasis. Elsewhere lungs are clear.   Electronically Signed   By: Bretta BangWilliam  Woodruff M.D.   On: 08/29/2013 00:02   Dg Abd Acute W/chest  08/28/2013   CLINICAL DATA:  Severe abdominal pain.  EXAM: ACUTE ABDOMEN SERIES (ABDOMEN 2 VIEW & CHEST 1 VIEW)  COMPARISON:  None.  FINDINGS: The left lower decubitus image demonstrates lucency in the right upper abdomen and most compatible with free air. Chest radiograph demonstrates very low lung volumes. Prominent interstitial markings are probably related to the low lung volumes. Heart size is within normal limits. Small amount of bowel gas in the abdomen. There is stool along the right side of the colon.  IMPRESSION: Study is positive for free air. Findings are concerning for a bowel perforation and recommend surgical consultation.  Low lung volumes.  Critical Value/emergent results were called by telephone at the time of interpretation on 08/28/2013 at 6:27 PM to Dr. Trixie DredgeEMILY WEST , who verbally acknowledged these results.   Electronically Signed   By: Richarda OverlieAdam  Henn  M.D.   On: 08/28/2013 18:28     ASSESSMENT: 1. Post-op AF with RVR    --s/p failed DC-CV on 1/9 2. Acute peritonitis 3. Septic shock 4. Perforated gastric ulcer s/p repair  5. Acute respiratory failure  PLAN/DISCUSSION:  He has AF with RVR secondary to critical illness. He has failed DC-CV and it is clear he will not maintain SR until his underlying condition recovers. Agree with rapid amiodarone load (we have given several boluses and now on gtt) and [facility] and as he hopefully improves. Not candidate for anti-coagulation due to surgery and perforated ulcer.  We will follow closely. Consider echo as needed.   D/w Dr. Delford FieldWright at the bedside.   The patient is critically ill with multiple organ systems failure and requires high complexity decision making for assessment and support, frequent evaluation and titration of therapies, application of advanced monitoring technologies and extensive interpretation of multiple databases.   Critical Care Time devoted to patient care services described in this note is 35 Minutes.  Jaki Steptoe,MD 7:04 PM

## 2013-08-29 NOTE — Consult Note (Signed)
I have seen and examined this pt this AM .  See f/u note to follow.  Dorcas Carrowatrick WrightMD Beeper  [phone]  Cell  [phone]  If no response or cell goes to voicemail, call beeper [phone]

## 2013-08-29 NOTE — Progress Notes (Signed)
Dr Margaree Mackintoshsui called and informed of pt's status changes and labs and NG drainage

## 2013-08-29 NOTE — Progress Notes (Signed)
Utilization Review Completed.James Mccormick T1/04/2014  

## 2013-08-29 NOTE — Procedures (Signed)
Arterial Catheter Insertion Procedure Note James LeydenChristopher D Mccormick [id number] 04/10/1966  Procedure: Insertion of Arterial Catheter  Indications: Blood pressure monitoring and Frequent blood sampling  Procedure Details Consent: Unable to obtain consent because of altered level of consciousness. Time Out: Verified patient identification, verified procedure, site/side was marked, verified correct patient position, special equipment/implants available, medications/allergies/relevent history reviewed, required imaging and test results available.  Performed  Maximum sterile technique was used including antiseptics, cap, gloves, gown, [place] hygiene, mask and sheet. Skin prep: Chlorhexidine; local anesthetic administered 20 gauge catheter was inserted into left radial artery using the Seldinger technique.  Evaluation Blood flow good; BP tracing good. Complications: No apparent complications.   James ParadiseSpencer, James Mccormick 08/29/2013

## 2013-08-29 NOTE — Progress Notes (Signed)
ANTIBIOTIC CONSULT NOTE - INITIAL  Pharmacy Consult for micafungin Indication: Perforated stomach with necrosis  No Known Allergies  Patient Measurements: Height: 5\' 11"  (180.3 cm) Weight: 225 lb (102.059 kg) IBW/kg (Calculated) : 75.3  Vital Signs: Temp: 100.7 F (38.2 C) (01/09 2000) Temp src: Oral (01/09 2000) BP: 91/46 mmHg (01/09 2005) Pulse Rate: 113 (01/09 2005)  Labs:  Recent Labs  08/28/13 2359 08/29/13 0102 08/29/13 0420 08/29/13 1600  WBC 2.1*  --  1.6* 4.7  HGB 16.8  --  16.4 14.9  PLT 176  --  169 197  CREATININE  --  1.01 1.07 1.42*   Estimated Creatinine Clearance: 76.5 ml/min (by C-G formula based on Cr of 1.42).  Medical History: Past Medical History  Diagnosis Date  . GERD (gastroesophageal reflux disease)   . Hypertension    Assessment: 48 y/o M s/p OR for perforated stomach with necrosis to continue broad spectrum antibiotics. He has been on vanc/zosyn and now expanding antifungal coverage. Cultures are pending.   Goal of Therapy:  Vancomycin trough level 15-20 mcg/ml  Plan:   Micafungin 100mg  IV q24 F/u with cultures

## 2013-08-29 NOTE — Progress Notes (Signed)
PARENTERAL NUTRITION CONSULT NOTE - INITIAL  Pharmacy Consult for TPN Indication: Prolonged ileus  No Known Allergies  Patient Measurements: Height: 5\' 11"  (180.3 cm) Weight: 225 lb (102.059 kg) IBW/kg (Calculated) : 75.3 Adjusted Body Weight: 83 kg  Vital Signs: Temp: 99.5 F (37.5 C) (01/09 0720) Temp src: Oral (01/09 0720) BP: 90/52 mmHg (01/09 0813) Pulse Rate: 144 (01/09 0858) Intake/Output from previous day: 01/08 0701 - 01/09 0700 In: 7091.9 [I.V.:3174.4; NG/GT:20; IV Piggyback:3897.5] Out: 1280 [Urine:1050; Drains:230] Intake/Output from this shift:    Labs:  Recent Labs  08/28/13 1625 08/28/13 2359 08/29/13 0420  WBC 6.2 2.1* 1.6*  HGB 16.0 16.8 16.4  HCT 46.1 48.7 48.5  PLT 190 176 169     Recent Labs  08/28/13 1625 08/29/13 0102 08/29/13 0420  NA 145 146 146  K 3.6* 3.5* 3.7  CL 109 109 112  CO2 19 24 18*  GLUCOSE 129* 168* 239*  BUN 20 24* 24*  CREATININE 0.65 1.01 1.07  CALCIUM 5.8* 5.9* 5.6*  MG  --  1.5 1.6  PHOS  --   --  1.9*  PROT 5.4*  --  4.0*  ALBUMIN 3.3*  --  2.4*  AST 18  --  56*  ALT 13  --  41  ALKPHOS 50  --  27*  BILITOT 0.3  --  0.4   Estimated Creatinine Clearance: 101.6 ml/min (by C-G formula based on Cr of 1.07).    Recent Labs  08/28/13 2320 08/29/13 0402 08/29/13 0733  GLUCAP 151* 179* 173*    Medical History: Past Medical History  Diagnosis Date  . GERD (gastroesophageal reflux disease)   . Hypertension     Medications:  Infusions:  . fentaNYL infusion INTRAVENOUS 75 mcg/hr (08/29/13 0700)  . midazolam (VERSED) infusion 1 mg/hr (08/29/13 0700)  . norepinephrine (LEVOPHED) Adult infusion 30 mcg/min (08/29/13 0700)  . phenylephrine (NEO-SYNEPHRINE) Adult infusion 150 mcg/min (08/29/13 0829)  . vasopressin (PITRESSIN) infusion - *FOR SHOCK* 0.03 Units/min (08/29/13 0700)    Insulin Requirements in the past 24 hours:  6 units Novolog SSI  Current Nutrition:  NPO  Assessment: 48 y.o. male  presented to ED 1/8 with abd pain. CT consistent with perforated stomach ulcer due to NSAID use; pneumoperitoneum; peritonitis. Taken emergently to OR where he underwent exp lap, debridement of necrotic stomach, repair of perforated stomach. Pt must be strict NPO until contrast study can be done to eval gastric repair which will be several days from now. TPN to be started in the interim.  GI: NPO  Endo: No h/o DM however CBGs>150 since surgery yesterday. Requiring SSI while NPO. Will add insulin to first bag of TPN.  Lytes: Phos 1.9- MD replaced. Ca 5.6, Corr Ca 6.9 - MD replaced. Other lytes wnl.  Renal: SCr stable. UOP 1050/12 hr. No MIVF on board. Pt + 5.7L since admit. Will need to watch.   Pulm: VDRF since surgery. FiO2 70%  Cards: Pt on pressors (phenylephrine at 150 mcg/min, NE at 30 mcg/min, vaso 0.03 units/min). HR 125-144. H/o HTN.  Hepatobil: LFTs wnl  Neuro: Sedated on fentanyl and versed. RASS -1 (goal -1)  ID: Vanc/Zosyn D#1 for perf stomach with necrosis. WBC low, Tm 99.5.     Best Practices: Enox, PPI IV, MC  TPN Access: CVC 08/29/13  TPN day#: 0  Nutritional Goals:  Per RD assessment  Plan:  1. Will start clinimix E 5/15 at 40 ml/hr and lipids at 65ml/hr. Advance to goal as tolerated. 2.  Continue SSI q4h 3. Will add small amount of insulin to initial TPN bag with CBGs > 150 off TPN 4. F/u TPN labs 5. F/u RD assessment  Christoper Fabianaron Dalila Arca, PharmD, BCPS Clinical pharmacist, pager [phone] 08/29/2013,9:03 AM

## 2013-08-29 NOTE — Progress Notes (Signed)
Dr Gala RomneyBensimhon called with continued heart rate 150 orders received.Dr Vassie LollAlva called with ABG and vital signs orders received

## 2013-08-29 NOTE — Procedures (Signed)
Central Venous Catheter Insertion Procedure Note James LeydenChristopher D Mccormick [id number] 07/22/1966  Procedure: Insertion of Central Venous Catheter Indications: Assessment of intravascular volume  Procedure Details Consent: Unable to obtain consent because of altered level of consciousness. Time Out: Verified patient identification, verified procedure, site/side was marked, verified correct patient position, special equipment/implants available, medications/allergies/relevent history reviewed, required imaging and test results available.  Performed  Maximum sterile technique was used including antiseptics, cap, gloves, gown, Lipa hygiene, mask and sheet. Skin prep: Chlorhexidine; local anesthetic administered A antimicrobial bonded/coated triple lumen catheter was placed in the right internal jugular vein using the Seldinger technique.  Evaluation Blood flow good Complications: No apparent complications Patient did tolerate procedure well. Chest X-ray ordered to verify placement.  CXR: pending.  Genella MechKOLLAR, [name] 08/29/2013, 1:14 AM  The resident was supervised with this line placement procedure. Luisa HartPatrick WrightMD

## 2013-08-29 NOTE — Progress Notes (Signed)
CRITICAL VALUE ALERT  Critical value received:  Ca 5.6  Date of notification:  08/29/13  Time of notification:  0502  Critical value read back:yes  Nurse who received alert:  Roselie AwkwardShannon Dejia Ebron, RN  MD notified (1st page):  N/A, correlates with previous lab, pt receiving IV calcium at current time

## 2013-08-29 NOTE — Progress Notes (Signed)
MD updated up current status, HR 130's, sbp steadily dropping into low 90's to upper 80's map  with neo up to . MD ordered to maintain MAP >65 and for an ECHO to be completed today. Will continue to monitor

## 2013-08-29 NOTE — Progress Notes (Signed)
Name: James LeydenChristopher D Dykes MRN: [id number] DOB: 01/04/1966    ADMISSION DATE:  08/28/2013 CONSULTATION DATE:  08/29/13  REFERRING MD :  Andrey CampanileWilson PRIMARY SERVICE: CCS  CHIEF COMPLAINT:  Abdominal Pain  BRIEF PATIENT DESCRIPTION: James Mccormick is a 48 year old male who presented with a perforated stomach ulcer due to NSAID use who underwent exploratory laparotomy abd repair of ulcer on 08/28/13 now with septic shock and ALI.  SIGNIFICANT EVENTS / STUDIES:  1/8 CT 1/8 Exploratory Laparotomy with repair  LINES / TUBES: 1/8 ETT 1/8 Abdominal drain 1/8 LUE PIV 1/8 RUE PIV 1/8 Foley  CULTURES: Blood cultures 1/9  ANTIBIOTICS: Vancomycin IV 1/8 >>>> Zosyn IV 1/8 >>>> Micafungin 1/9 >>>>  SUBJECTIVE: Sedated on vent but arouses with stimulation.  VITAL SIGNS: Temp:  [98.2 F (36.8 C)-100.7 F (38.2 C)] 100.7 F (38.2 C) (01/09 2000) Pulse Rate:  [25-150] 112 (01/09 2000) Resp:  [0-35] 35 (01/09 2000) BP: (64-124)/(31-80) 87/49 mmHg (01/09 2000) SpO2:  [73 %-100 %] 98 % (01/09 2000) Arterial Line BP: (70-139)/(30-62) 93/48 mmHg (01/09 2000) FiO2 (%):  [50 %-100 %] 70 % (01/09 2000) HEMODYNAMICS: CVP:  [4 mmHg-10 mmHg] 10 mmHg VENTILATOR SETTINGS: Vent Mode:  [-] PRVC FiO2 (%):  [50 %-100 %] 70 % Set Rate:  [15 bmp-35 bmp] 35 bmp Vt Set:  [450 mL-600 mL] 450 mL PEEP:  [5 cmH20-12 cmH20] 10 cmH20 Plateau Pressure:  [18 cmH20-28 cmH20] 28 cmH20 INTAKE / OUTPUT: Intake/Output     01/09 0701 - 01/10 0700   I.V. (mL/kg) 4754.4 (46.6)   NG/GT 90   IV Piggyback 820.5   TPN 50   Total Intake(mL/kg) 5714.9 (56)   Urine (mL/kg/hr) 500 (0.4)   Emesis/NG output 650 (0.5)   Drains 425 (0.3)   Total Output 1575   Net +4139.9        PHYSICAL EXAMINATION: General:  Intubated, Awake RASS 0 Neuro:  RASS 0 HEENT:  [place]/AT Cardiovascular:  Tachycardic, no murmur, dusky extremities Lungs:  Mechanical breath sounds bilaterally Abdomen:  Bandages c/d/i, drain  suctioning Musculoskeletal:  No edmea Skin:  Warm, dry  LABS:  CBC  Recent Labs Lab 08/28/13 2359 08/29/13 0420 08/29/13 1600  WBC 2.1* 1.6* 4.7  HGB 16.8 16.4 14.9  HCT 48.7 48.5 43.5  PLT 176 169 197   BMET  Recent Labs Lab 08/29/13 0102 08/29/13 0420 08/29/13 1600  NA 146 146 146  K 3.5* 3.7 3.9  CL 109 112 112  CO2 24 18* 25  BUN 24* 24* 29*  CREATININE 1.01 1.07 1.42*  GLUCOSE 168* 239* 124*   Electrolytes  Recent Labs Lab 08/29/13 0102 08/29/13 0420 08/29/13 1600  CALCIUM 5.9* 5.6* 9.5  MG 1.5 1.6  --   PHOS  --  1.9*  --    Liver Enzymes  Recent Labs Lab 08/28/13 1625 08/29/13 0420  AST 18 56*  ALT 13 41  ALKPHOS 50 27*  BILITOT 0.3 0.4  ALBUMIN 3.3* 2.4*    Imaging Ct Abdomen Pelvis W Contrast  08/28/2013   CLINICAL DATA:  Abdominal pain and pneumoperitoneum.  EXAM: CT ABDOMEN AND PELVIS WITH CONTRAST  TECHNIQUE: Multidetector CT imaging of the abdomen and pelvis was performed using the standard protocol following bolus administration of intravenous contrast.  CONTRAST:  100mL OMNIPAQUE IOHEXOL 300 MG/ML  SOLN  COMPARISON:  Abdominal radiograph 08/28/2013  FINDINGS: There is a small right pleural effusion with extensive atelectasis at the right lung base. Mild atelectasis at the left lung  base.  There is pneumoperitoneum with a collection a gas along the anterior abdomen and multiple locules of gas throughout the upper abdomen. There is a moderate amount of abdominal ascites. Mild distension of the esophagus which contains fluid. There may be wall thickening along the anterior gastric body and there is gas in the gastrohepatic ligament. There is edema and stranding throughout the anterior upper abdomen. There is an air-fluid level within the stomach and some of the fluid appears to have fatty content. No gross abnormality into the duodenum. Mild dilatation of small bowel loops. There is colonic diverticulosis but no evidence to suggest acute colonic  inflammation. No gross abnormality to the appendix.  No significant lymphadenopathy. No gross abnormality to the prostate, seminal vesicles or urinary bladder. No gross abnormality to the liver, gallbladder or portal venous system. Normal appearance of the spleen, adrenal glands, kidneys and pancreas. Incidentally, there are punctate nonobstructive left kidney stones. No acute bone abnormality. Degenerative disc changes in the lower lumbar spine.  IMPRESSION: Study is positive for pneumoperitoneum and free fluid. Findings are suggestive for a perforated viscus. The majority of inflammatory changes and gas are located in the upper abdomen and centered around the stomach. There may be mild wall thickening along the anterior aspect of the stomach.  Colonic diverticulosis but there is not clear evidence for acute colonic inflammation.  Right basilar atelectasis with a small amount a right pleural fluid.  Mild dilatation of small bowel loops could represent an ileus.  Nonobstructive left kidney stones.  Critical Value/emergent results were called by telephone at the time of interpretation on 08/28/2013 at 7:40 PM to Dr. Gaynelle Adu, who verbally acknowledged these results.   Electronically Signed   By: Richarda Overlie M.D.   On: 08/28/2013 19:45   Dg Chest Port 1 View  08/29/2013   CLINICAL DATA:  Exploratory laparotomy.  EXAM: PORTABLE CHEST - 1 VIEW  COMPARISON:  August 29, 2013 1:20 a.m.  FINDINGS: Endotracheal tube, right central venous line, nasogastric tube are stable compared to prior exam. There is increased pulmonary interstitium bilaterally. There is probably a small right pleural effusion. The previously noted atelectasis of the right mid lung is less prominent. The mediastinal contour and heart size are stable.  IMPRESSION: Mild pulmonary vascular congestion. Probable small right pleural effusion.   Electronically Signed   By: Sherian Rein M.D.   On: 08/29/2013 07:11   Dg Chest Port 1 View  08/29/2013    CLINICAL DATA:  Central catheter placement  EXAM: PORTABLE CHEST - 1 VIEW  COMPARISON:  August 28, 2013  FINDINGS: There is a central catheter with tip at the cavoatrial junction. Endotracheal tube tip is 3.6 cm above the carina. Nasogastric tube tip and side port are below the diaphragm in the stomach region. No pneumothorax.  There is patchy atelectasis in the right mid lung. Lungs are otherwise clear. Heart is upper normal in size with normal pulmonary vascularity. No adenopathy.  IMPRESSION: Tube and catheter positions as described. Note that the new central catheter tip is at the cavoatrial junction. No pneumothorax. Stable right midlung atelectatic change.   Electronically Signed   By: Bretta Bang M.D.   On: 08/29/2013 01:29   Dg Chest Port 1 View  08/29/2013   CLINICAL DATA:  Hypoxia  EXAM: PORTABLE CHEST - 1 VIEW  COMPARISON:  Study obtained earlier in the day  FINDINGS: Endotracheal tube is 3.3 cm above the carina. Nasogastric tube tip and side port are in the  stomach. No pneumothorax.  There is atelectatic change in the right mid lung. Lungs are otherwise clear. Heart size and pulmonary vascularity are normal. No adenopathy.  IMPRESSION: Tube positions as described without pneumothorax. Mild right midlung atelectasis. Elsewhere lungs are clear.   Electronically Signed   By: Bretta Bang M.D.   On: 08/29/2013 00:02   Dg Abd Acute W/chest  08/28/2013   CLINICAL DATA:  Severe abdominal pain.  EXAM: ACUTE ABDOMEN SERIES (ABDOMEN 2 VIEW & CHEST 1 VIEW)  COMPARISON:  None.  FINDINGS: The left lower decubitus image demonstrates lucency in the right upper abdomen and most compatible with free air. Chest radiograph demonstrates very low lung volumes. Prominent interstitial markings are probably related to the low lung volumes. Heart size is within normal limits. Small amount of bowel gas in the abdomen. There is stool along the right side of the colon.  IMPRESSION: Study is positive for free air.  Findings are concerning for a bowel perforation and recommend surgical consultation.  Low lung volumes.  Critical Value/emergent results were called by telephone at the time of interpretation on 08/28/2013 at 6:27 PM to Dr. Trixie Dredge , who verbally acknowledged these results.   Electronically Signed   By: Richarda Overlie M.D.   On: 08/28/2013 18:28      ASSESSMENT / PLAN:  Called to Bedside to Assist with Management Refractory Shock. On Arrival patient intubated and sedated but arousable. Follows directions. MAP ~ 62 on epi, norepi, neo. O2 well on FiO2 of 70%. Autopeep ~ 3 mmHg (13 total on 10 PEEP). Coarse BS bilaterally. UOP 480 today, 100 since 7PM.   ABG notable for hypoxemia with metabolic acidosis with pH 7.276. Labs notable for Cr 1.42 from 0.65 yesterday. Albumin 2.4.   PULMONARY A: Acute respiratory failure with ALI due to severe sepsis and peritonitis and ARDS from gastric perforation EXPECTED postop P:   - ARDS protocol - Low Threshold to Paralyze to Facilitate Ventilation - VAP prevention - Hold SBT while unstable  CARDIOVASCULAR A: Septic shock from peritonitis d/t gastric perf Systolic Heart Failure, Unclear Chronicity: ? Chronic vs. Stress Induced. P:  - monitor CVP - check SVO2, could consider adding chronotropic agent - wean pressures, can add vasopressin if needed - trd PCT and lactate - check cortisol, empirically starting steroid pending result  RENAL A: Acute Renal Failure: Maintaining some UOP.   Hypokalemic, Hypocalcemic . Hypophosphate >>replete P:   Serial BMPs  GASTROINTESTINAL A:  Perforated Gastric ulcer s/p repair Low albumen, mod Prot cal malnutrition P:   - NPO, delay feedings - PPI - Drain still in place from surgery -TPN when more stable  HEMATOLOGIC A:  Increased DVT risk P:  - SCDs - Lovenox  INFECTIOUS A:  Perotonitis, septic shock , had cashews in peritoneal cavity P:   - IV Vancomycin - IV Zosyn - Add micafungin - f/u  c/s  ENDOCRINE A:  Diabetes Mellitus Type 2 P:   -CBG q4 -SSI - Check Cortisol level  NEUROLOGIC A:  Sedated post op P:   - Goal RASS 0 - Sedation with fentanyl, wean off propofol   I have personally obtained a history, examined the patient, evaluated laboratory and imaging results, formulated the assessment and plan and placed orders. CRITICAL CARE: The patient is critically ill with multiple organ systems failure and requires high complexity decision making for assessment and support, frequent evaluation and titration of therapies, application of advanced monitoring technologies and extensive interpretation of multiple databases. Critical  Care Time devoted to patient care services described in this note is 40 minutes.   Emary Zalar R.MD 08/29/2013, 8:26 PM

## 2013-08-29 NOTE — Progress Notes (Signed)
Name: James Mccormick MRN: [id number] DOB: February 22, 1966    ADMISSION DATE:  08/28/2013 CONSULTATION DATE:  08/28/13  REFERRING MD :  Andrey Campanile PRIMARY SERVICE: [facility]  CHIEF COMPLAINT:  Abdominal Pain  BRIEF PATIENT DESCRIPTION: James Mccormick is a 48 year old male who presented with a perforated stomach ulcer due to NSAID use who underwent exploratory laparotomy abd repair of ulcer on 08/28/13.  SIGNIFICANT EVENTS / STUDIES:  1/8 CT 1/8 Exploratory Laparotomy with repair  LINES / TUBES: 1/8 ETT 1/8 Abdominal drain 1/8 LUE PIV 1/8 RUE PIV 1/8 Foley  CULTURES: None  ANTIBIOTICS: Vancomycin IV 1/8 >>>> Zosyn IV 1/8 >>>>  SUBJECTIVE:  Pt awake on vent. Still on vasopressors.  ALI pattern on CXR.  VITAL SIGNS: Temp:  [97.7 F (36.5 C)-99.5 F (37.5 C)] 99.5 F (37.5 C) (01/09 0720) Pulse Rate:  [93-143] 132 (01/09 0813) Resp:  [0-33] 26 (01/09 0813) BP: (64-151)/(33-80) 90/52 mmHg (01/09 0813) SpO2:  [86 %-100 %] 90 % (01/09 0813) Arterial Line BP: (78-139)/(38-62) 92/54 mmHg (01/09 0745) FiO2 (%):  [50 %-100 %] 70 % (01/09 0813) Weight:  [102.059 kg (225 lb)] 102.059 kg (225 lb) (01/08 1550) HEMODYNAMICS: CVP:  [4 mmHg-8 mmHg] 7 mmHg VENTILATOR SETTINGS: Vent Mode:  [-] PRVC FiO2 (%):  [50 %-100 %] 70 % Set Rate:  [15 bmp-28 bmp] 28 bmp Vt Set:  [530 mL-600 mL] 530 mL PEEP:  [5 cmH20-12 cmH20] 12 cmH20 Plateau Pressure:  [18 cmH20-26 cmH20] 26 cmH20 INTAKE / OUTPUT: Intake/Output     01/08 0701 - 01/09 0700 01/09 0701 - 01/10 0700   I.V. (mL/kg) 3174.4 (31.1)    NG/GT 20    IV Piggyback 3897.5    Total Intake(mL/kg) 7091.9 (69.5)    Urine (mL/kg/hr) 1050    Drains 230    Total Output 1280     Net +5811.9            PHYSICAL EXAMINATION: General:  Intubated, Awake RASS 0 Neuro:  RASS 0 HEENT:  [place]/AT Cardiovascular:  Tachycardic, no murmur Lungs:  CTA b/l Abdomen:  Bandages c/d/i, drain suctioning Musculoskeletal:  No edmea Skin:  Warm,  dry  LABS:  CBC  Recent Labs Lab 08/28/13 1625 08/28/13 2359 08/29/13 0420  WBC 6.2 2.1* 1.6*  HGB 16.0 16.8 16.4  HCT 46.1 48.7 48.5  PLT 190 176 169   BMET  Recent Labs Lab 08/28/13 1625 08/29/13 0102 08/29/13 0420  NA 145 146 146  K 3.6* 3.5* 3.7  CL 109 109 112  CO2 19 24 18*  BUN 20 24* 24*  CREATININE 0.65 1.01 1.07  GLUCOSE 129* 168* 239*   Electrolytes  Recent Labs Lab 08/28/13 1625 08/29/13 0102 08/29/13 0420  CALCIUM 5.8* 5.9* 5.6*  MG  --  1.5 1.6  PHOS  --   --  1.9*   Liver Enzymes  Recent Labs Lab 08/28/13 1625 08/29/13 0420  AST 18 56*  ALT 13 41  ALKPHOS 50 27*  BILITOT 0.3 0.4  ALBUMIN 3.3* 2.4*    Imaging Ct Abdomen Pelvis W Contrast  08/28/2013   CLINICAL DATA:  Abdominal pain and pneumoperitoneum.  EXAM: CT ABDOMEN AND PELVIS WITH CONTRAST  TECHNIQUE: Multidetector CT imaging of the abdomen and pelvis was performed using the standard protocol following bolus administration of intravenous contrast.  CONTRAST:  OMNIPAQUE IOHEXOL 300 MG/ML  SOLN  COMPARISON:  Abdominal radiograph 08/28/2013  FINDINGS: There is a small right pleural effusion with extensive  atelectasis at the right lung base. Mild atelectasis at the left lung base.  There is pneumoperitoneum with a collection a gas along the anterior abdomen and multiple locules of gas throughout the upper abdomen. There is a moderate amount of abdominal ascites. Mild distension of the esophagus which contains fluid. There may be wall thickening along the anterior gastric body and there is gas in the gastrohepatic ligament. There is edema and stranding throughout the anterior upper abdomen. There is an air-fluid level within the stomach and some of the fluid appears to have fatty content. No gross abnormality into the duodenum. Mild dilatation of small bowel loops. There is colonic diverticulosis but no evidence to suggest acute colonic inflammation. No gross abnormality to the appendix.   No significant lymphadenopathy. No gross abnormality to the prostate, seminal vesicles or urinary bladder. No gross abnormality to the liver, gallbladder or portal venous system. Normal appearance of the spleen, adrenal glands, kidneys and pancreas. Incidentally, there are punctate nonobstructive left kidney stones. No acute bone abnormality. Degenerative disc changes in the lower lumbar spine.  IMPRESSION: Study is positive for pneumoperitoneum and free fluid. Findings are suggestive for a perforated viscus. The majority of inflammatory changes and gas are located in the upper abdomen and centered around the stomach. There may be mild wall thickening along the anterior aspect of the stomach.  Colonic diverticulosis but there is not clear evidence for acute colonic inflammation.  Right basilar atelectasis with a small amount a right pleural fluid.  Mild dilatation of small bowel loops could represent an ileus.  Nonobstructive left kidney stones.  Critical Value/emergent results were called by telephone at the time of interpretation on 08/28/2013 at 7:40 PM to Dr. Gaynelle AduEric Mccormick, who verbally acknowledged these results.   Electronically Signed   By: Richarda OverlieAdam  Henn M.D.   On: 08/28/2013 19:45   Dg Chest Port 1 View  08/29/2013   CLINICAL DATA:  Exploratory laparotomy.  EXAM: PORTABLE CHEST - 1 VIEW  COMPARISON:  August 29, 2013 1:20 a.m.  FINDINGS: Endotracheal tube, right central venous line, nasogastric tube are stable compared to prior exam. There is increased pulmonary interstitium bilaterally. There is probably a small right pleural effusion. The previously noted atelectasis of the right mid lung is less prominent. The mediastinal contour and heart size are stable.  IMPRESSION: Mild pulmonary vascular congestion. Probable small right pleural effusion.   Electronically Signed   By: Sherian ReinWei-Chen  Lin M.D.   On: 08/29/2013 07:11   Dg Chest Port 1 View  08/29/2013   CLINICAL DATA:  Central catheter placement  EXAM: PORTABLE  CHEST - 1 VIEW  COMPARISON:  August 28, 2013  FINDINGS: There is a central catheter with tip at the cavoatrial junction. Endotracheal tube tip is 3.6 cm above the carina. Nasogastric tube tip and side port are below the diaphragm in the stomach region. No pneumothorax.  There is patchy atelectasis in the right mid lung. Lungs are otherwise clear. Heart is upper normal in size with normal pulmonary vascularity. No adenopathy.  IMPRESSION: Tube and catheter positions as described. Note that the new central catheter tip is at the cavoatrial junction. No pneumothorax. Stable right midlung atelectatic change.   Electronically Signed   By: Bretta BangWilliam  Woodruff M.D.   On: 08/29/2013 01:29   Dg Chest Port 1 View  08/29/2013   CLINICAL DATA:  Hypoxia  EXAM: PORTABLE CHEST - 1 VIEW  COMPARISON:  Study obtained earlier in the day  FINDINGS: Endotracheal tube is 3.3 cm  above the carina. Nasogastric tube tip and side port are in the stomach. No pneumothorax.  There is atelectatic change in the right mid lung. Lungs are otherwise clear. Heart size and pulmonary vascularity are normal. No adenopathy.  IMPRESSION: Tube positions as described without pneumothorax. Mild right midlung atelectasis. Elsewhere lungs are clear.   Electronically Signed   By: Bretta Bang M.D.   On: 08/29/2013 00:02   Dg Abd Acute W/chest  08/28/2013   CLINICAL DATA:  Severe abdominal pain.  EXAM: ACUTE ABDOMEN SERIES (ABDOMEN 2 VIEW & CHEST 1 VIEW)  COMPARISON:  None.  FINDINGS: The left lower decubitus image demonstrates lucency in the right upper abdomen and most compatible with free air. Chest radiograph demonstrates very low lung volumes. Prominent interstitial markings are probably related to the low lung volumes. Heart size is within normal limits. Small amount of bowel gas in the abdomen. There is stool along the right side of the colon.  IMPRESSION: Study is positive for free air. Findings are concerning for a bowel perforation and recommend  surgical consultation.  Low lung volumes.  Critical Value/emergent results were called by telephone at the time of interpretation on 08/28/2013 at 6:27 PM to Dr. Trixie Dredge , who verbally acknowledged these results.   Electronically Signed   By: Richarda Overlie M.D.   On: 08/28/2013 18:28     ASSESSMENT / PLAN:  PULMONARY A: Acute respiratory failure with ALI due to severe sepsis and peritonitis and ARDS from gastric perforation EXPECTED postop P:   -ARDS protocol -daily WUA SBT - VAP prevention  CARDIOVASCULAR A: septic shock from peritonitis d/t gastric perf P:  - monitor CVP -wean pressures -trd PCT and lactate  RENAL A:  Hypokalemic, Hypocalcemic . Hypophosphate >>replete P:   Replete po4, Ca, K  GASTROINTESTINAL A:  Perforated Gastric ulcer s/p repair Low albumen, mod Prot cal malnutrition P:   - NPO, delay feedings - PPI - Drain still in place from surgery -TPN  HEMATOLOGIC A:  Increased DVT risk P:  - SCDs - Lovenox  INFECTIOUS A:  Perotonitis, septic shock , had cashews in peritoneal cavity P:   - IV Vancomycin - IV Zosyn -f/u c/s  ENDOCRINE A:  Diabetes Mellitus Type 2 P:   -CBG q4 -SSI - Check Cortisol level  NEUROLOGIC A:  Sedated post op P:   - Goal RASS 0 - Sedation with fentanyl, wean off propofol   I have personally obtained a history, examined the patient, evaluated laboratory and imaging results, formulated the assessment and plan and placed orders. CRITICAL CARE: The patient is critically ill with multiple organ systems failure and requires high complexity decision making for assessment and support, frequent evaluation and titration of therapies, application of advanced monitoring technologies and extensive interpretation of multiple databases. Critical Care Time devoted to patient care services described in this note is  40  minutes.   Dorcas Carrow Beeper  [phone]  Cell  [phone]  If no response or cell goes to voicemail,  call beeper [phone]   08/29/2013, 8:49 AM

## 2013-08-29 NOTE — Progress Notes (Signed)
1 Day Post-Op  Subjective: POD #1 repair of large gastric perforation Patient remains intubated, sedated On Neo, Levophed, Vasopressin Still hypotensive - CCM managing  Objective: Vital signs in last 24 hours: Temp:  [97.7 F (36.5 C)-99.5 F (37.5 C)] 99.5 F (37.5 C) (01/09 0720) Pulse Rate:  [93-143] 132 (01/09 0813) Resp:  [0-33] 26 (01/09 0813) BP: (64-151)/(33-80) 90/52 mmHg (01/09 0813) SpO2:  [86 %-100 %] 90 % (01/09 0813) Arterial Line BP: (78-139)/(38-62) 92/54 mmHg (01/09 0745) FiO2 (%):  [50 %-100 %] 70 % (01/09 0813) Weight:  [225 lb (102.059 kg)] 225 lb (102.059 kg) (01/08 1550)    Intake/Output from previous day: 01/08 0701 - 01/09 0700 In: 7091.9 [I.V.:3174.4; NG/GT:20; IV Piggyback:3897.5] Out: 1280 [Urine:1050; Drains:230] Intake/Output this shift:    General appearance: intubated, but eyes open, responsive GI: tender, mildly distended; + BS Wound - clean; minimal drainage JP bulbs - drainage is dark sanguinous  Lab Results:   Recent Labs  08/28/13 2359 08/29/13 0420  WBC 2.1* 1.6*  HGB 16.8 16.4  HCT 48.7 48.5  PLT 176 169   BMET  Recent Labs  08/29/13 0102 08/29/13 0420  NA 146 146  K 3.5* 3.7  CL 109 112  CO2 24 18*  GLUCOSE 168* 239*  BUN 24* 24*  CREATININE 1.01 1.07  CALCIUM 5.9* 5.6*   PT/INR No results found for this basename: LABPROT, INR,  in the last 72 hours ABG  Recent Labs  08/29/13 0156 08/29/13 0410  PHART 7.289* 7.243*  HCO3 19.4* 18.0*    Studies/Results: Ct Abdomen Pelvis W Contrast  08/28/2013   CLINICAL DATA:  Abdominal pain and pneumoperitoneum.  EXAM: CT ABDOMEN AND PELVIS WITH CONTRAST  TECHNIQUE: Multidetector CT imaging of the abdomen and pelvis was performed using the standard protocol following bolus administration of intravenous contrast.  CONTRAST:  100mL OMNIPAQUE IOHEXOL 300 MG/ML  SOLN  COMPARISON:  Abdominal radiograph 08/28/2013  FINDINGS: There is a small right pleural effusion with  extensive atelectasis at the right lung base. Mild atelectasis at the left lung base.  There is pneumoperitoneum with a collection a gas along the anterior abdomen and multiple locules of gas throughout the upper abdomen. There is a moderate amount of abdominal ascites. Mild distension of the esophagus which contains fluid. There may be wall thickening along the anterior gastric body and there is gas in the gastrohepatic ligament. There is edema and stranding throughout the anterior upper abdomen. There is an air-fluid level within the stomach and some of the fluid appears to have fatty content. No gross abnormality into the duodenum. Mild dilatation of small bowel loops. There is colonic diverticulosis but no evidence to suggest acute colonic inflammation. No gross abnormality to the appendix.  No significant lymphadenopathy. No gross abnormality to the prostate, seminal vesicles or urinary bladder. No gross abnormality to the liver, gallbladder or portal venous system. Normal appearance of the spleen, adrenal glands, kidneys and pancreas. Incidentally, there are punctate nonobstructive left kidney stones. No acute bone abnormality. Degenerative disc changes in the lower lumbar spine.  IMPRESSION: Study is positive for pneumoperitoneum and free fluid. Findings are suggestive for a perforated viscus. The majority of inflammatory changes and gas are located in the upper abdomen and centered around the stomach. There may be mild wall thickening along the anterior aspect of the stomach.  Colonic diverticulosis but there is not clear evidence for acute colonic inflammation.  Right basilar atelectasis with a small amount a right pleural fluid.  Mild  dilatation of small bowel loops could represent an ileus.  Nonobstructive left kidney stones.  Critical Value/emergent results were called by telephone at the time of interpretation on 08/28/2013 at 7:40 PM to Dr. Gaynelle Adu, who verbally acknowledged these results.    Electronically Signed   By: Richarda Overlie M.D.   On: 08/28/2013 19:45   Dg Chest Port 1 View  08/29/2013   CLINICAL DATA:  Exploratory laparotomy.  EXAM: PORTABLE CHEST - 1 VIEW  COMPARISON:  August 29, 2013 1:20 a.m.  FINDINGS: Endotracheal tube, right central venous line, nasogastric tube are stable compared to prior exam. There is increased pulmonary interstitium bilaterally. There is probably a small right pleural effusion. The previously noted atelectasis of the right mid lung is less prominent. The mediastinal contour and heart size are stable.  IMPRESSION: Mild pulmonary vascular congestion. Probable small right pleural effusion.   Electronically Signed   By: Sherian Rein M.D.   On: 08/29/2013 07:11   Dg Chest Port 1 View  08/29/2013   CLINICAL DATA:  Central catheter placement  EXAM: PORTABLE CHEST - 1 VIEW  COMPARISON:  August 28, 2013  FINDINGS: There is a central catheter with tip at the cavoatrial junction. Endotracheal tube tip is 3.6 cm above the carina. Nasogastric tube tip and side port are below the diaphragm in the stomach region. No pneumothorax.  There is patchy atelectasis in the right mid lung. Lungs are otherwise clear. Heart is upper normal in size with normal pulmonary vascularity. No adenopathy.  IMPRESSION: Tube and catheter positions as described. Note that the new central catheter tip is at the cavoatrial junction. No pneumothorax. Stable right midlung atelectatic change.   Electronically Signed   By: Bretta Bang M.D.   On: 08/29/2013 01:29   Dg Chest Port 1 View  08/29/2013   CLINICAL DATA:  Hypoxia  EXAM: PORTABLE CHEST - 1 VIEW  COMPARISON:  Study obtained earlier in the day  FINDINGS: Endotracheal tube is 3.3 cm above the carina. Nasogastric tube tip and side port are in the stomach. No pneumothorax.  There is atelectatic change in the right mid lung. Lungs are otherwise clear. Heart size and pulmonary vascularity are normal. No adenopathy.  IMPRESSION: Tube positions as  described without pneumothorax. Mild right midlung atelectasis. Elsewhere lungs are clear.   Electronically Signed   By: Bretta Bang M.D.   On: 08/29/2013 00:02   Dg Abd Acute W/chest  08/28/2013   CLINICAL DATA:  Severe abdominal pain.  EXAM: ACUTE ABDOMEN SERIES (ABDOMEN 2 VIEW & CHEST 1 VIEW)  COMPARISON:  None.  FINDINGS: The left lower decubitus image demonstrates lucency in the right upper abdomen and most compatible with free air. Chest radiograph demonstrates very low lung volumes. Prominent interstitial markings are probably related to the low lung volumes. Heart size is within normal limits. Small amount of bowel gas in the abdomen. There is stool along the right side of the colon.  IMPRESSION: Study is positive for free air. Findings are concerning for a bowel perforation and recommend surgical consultation.  Low lung volumes.  Critical Value/emergent results were called by telephone at the time of interpretation on 08/28/2013 at 6:27 PM to Dr. Trixie Dredge , who verbally acknowledged these results.   Electronically Signed   By: Richarda Overlie M.D.   On: 08/28/2013 18:28    Anti-infectives: Anti-infectives   Start     Dose/Rate Route Frequency Ordered Stop   08/29/13 0400  vancomycin (VANCOCIN) IVPB 1000 mg/200  mL premix     1,000 mg 200 mL/hr over 60 Minutes Intravenous Every 8 hours 08/28/13 2358     08/29/13 0400  piperacillin-tazobactam (ZOSYN) IVPB 3.375 g     3.375 g 12.5 mL/hr over 240 Minutes Intravenous 3 times per day 08/28/13 2358     08/28/13 1845  [MAR Hold]  vancomycin (VANCOCIN) IVPB 1000 mg/200 mL premix     (On MAR Hold since 08/28/13 1957)   1,000 mg 200 mL/hr over 60 Minutes Intravenous  Once 08/28/13 1831 08/28/13 1955   08/28/13 1845  [MAR Hold]  piperacillin-tazobactam (ZOSYN) IVPB 3.375 g     (On MAR Hold since 08/28/13 1957)   3.375 g 100 mL/hr over 30 Minutes Intravenous  Once 08/28/13 1831 08/28/13 2056      Assessment/Plan: s/p Procedure(s): EXPLORATORY  LAPAROTOMY,debridment of nacrotic stomach,and primary repair of perforated stomach. (N/A) Discussed with Dr. Delford Field - would go ahead and start TPN.  It will be several days before we obtain a contrast study to evaluate gastric repair, and  He will need to be strict NPO until that point. Continue PPI, abx Dressing changes Appreciate CCM management   LOS: 1 day    Caysen Whang K. 08/29/2013

## 2013-08-29 NOTE — Op Note (Signed)
NAME:  James Mccormick, James Mccormick            ACCOUNT NO.:  [id number]  MEDICAL RECORD NO.:  [id number]  LOCATION:  2S15C                        FACILITY:  [facility]  PHYSICIAN:  Mary Sellaric M. Andrey CampanileWilson, MD, FACSDATE OF BIRTH:  Mar 16, 1966  DATE OF PROCEDURE:  08/28/2013 DATE OF DISCHARGE:                              OPERATIVE REPORT   PREOPERATIVE DIAGNOSIS:  Free air, peritonitis.  POSTOPERATIVE DIAGNOSIS:  Perforated lesser curvature of the stomach with necrosis.  PROCEDURE: 1. Exploratory laparotomy. 2. Debridement of necrotic stomach with scissors. 3. Primary repair of perforated stomach in 2 layers.  SURGEON:  Mary Sellaric M. Andrey CampanileWilson, MD, FACS.  ASSISTANT SURGEON:  Maisie Fushomas A. Cornett, MD  ANESTHESIA:  General.  EBL:  Less than 100 mL.  DRAINS:  Two 19-French round drains in the epigastrium around the lesser curvature of the stomach near the site of repair, NG tube, Foley catheter.  SPECIMEN:  Lesser curvature of stomach x2.  FINDINGS:  The patient had about 2 L of contamination in his abdomen, it was grayish fluid with multiple and large amounts of partially digested nuts.  The amount of partially digested nuts was very impressive.  The patient had an approximate 3.5 inch to 4 inch perforation along the lesser curvature of the stomach.  Along in this area, he had an area of ischemia and necrosis.  The proximal lesser curvature of the stomach at the GE junction was severely indurated and the tissue was thickened. The greater curvature of the stomach,  antrum and cardia appeared normal and healthy and viable.  We did not appreciate or palpate any mass or tumor.  INDICATIONS FOR PROCEDURE:  The patient is a pleasant 48 year old gentleman.  He was not been well for the past 3 weeks.  He has had constant epigastric pain, associated with nausea and vomiting.  He started self  medicating himself with Aleve and ultimately multiple Goody tablets.  The pain acutely worsened this afternoon prompting  him to come to the emergency room.  He had an evaluation including imaging which demonstrated findings consistent with a perforated probable gastric ulcer.  I had a prolonged conversation with the patient and his family members in the holding area.  I discussed the risks and benefits of surgery.  Please see my H and P for further details.  He elected to proceed to the operating room.  DESCRIPTION OF PROCEDURE:  He was [facility] emergently [facility].  He was placed supine on the operating room table.  General endotracheal anesthesia was established. A  surgical time-out was performed.  His abdomen was prepped and draped in usual standard surgical fashion.  He was given large volume of fluid resuscitation.  He received IV vanc and Zosyn prior to skin incision.  A surgical time-out was performed.  An upper midline incision was made sharply with a #10 blade and the subcutaneous tissue was divided with electrocautery and the fascia was entered and the abdominal cavity was carefully entered.  There was immediate gush of gray fluid with chunks of particulate matter.  I spent probably at least 20 minutes evacuating his abdominal cavity and followed this content.  It appeared the area of concern was  not along his pre-pyloric area but actually along the lesser curvature of the stomach.  There was a lot of discoloration in the gastrohepatic ligament.  Once I started to open up the gastrohepatic ligament,  I was able to identify the site of perforation.  Based on the extent of the perforation and the surrounding discoloration and induration of the stomach, I called in my partner Dr. [name] to help me.  A Bookwalter retractor was placed.  I was able to take down more of gastrohepatic ligament with Bovie electrocautery.  We were able to identify the edges and the extent of the perforation.  It was along the lesser curvature of the stomach for approximately  3-1/2 to 4 inches.  It did not go up to the GE junction.  However, the proximal stomach along the right crus was indurated and thickened.  The antrum and greater curvature and pylorus appeared normal and quite healthy.  We were able to identify the NG-tube and able to essentially visualize it directly into the stomach.  The underlying mucosa appeared normal.  It was obviously irritated and inflamed.  There was no appreciable mass.  At this point, we discussed several options.  We decided against doing a wedge resection along the lesser curvature of the stomach because of the tissue thickness and induration along the proximal lesser curvature of the stomach near the GE junction.  I was afraid that the staple line would breakdown.  I did not think he warranted a total gastrectomy.  We elected to debride the edges sharply off the area of necrotic stomach along the lesser curvature until we got back to bleeding serosa and submucosa.  This was passed off the field as a specimen.  I then closed the defect in a longitudinal manner with a running 3-0 Vicryl suture incorporating the serosa, submucosa, and mucosa.  I then imbricated the suture line with interrupted 2-0 Vicryls in a Lembert fashion.  Along the right side of the suture line, there was not a lot of stomach to incorporate into that suture line, so some of the Lembert sutures on the right side of the repair line incorporated perigastric fat tissue. However, it looked viable and appeared to be watertight.  We then went about copiously irrigating the abdomen to get rid of all the particulate matter.  It appeared to be consistent with partially digested nuts.  We irrigated approximately 24 L of saline.  We placed a large amount of omentum over the area of repair.  We then made a small skin incision on each side of the upper abdominal wall and brought around drains out through the skin, 1 on the left and 1 on the right and secured it with  2- 0 nylon sutures.  They were placed around the area of repair.  We then closed the fascia with a running #1 looped PDS x2, the subcutaneous tissue was left open.  Moist 4x4s, ABDs, and sterile dressings were applied.  The patient was left intubated and taken to the ICU in stable condition.  All needle, instrument, sponge counts were correct x2. There were no immediate complications.     Mary Sella. Andrey Campanile, MD, FACS     EMW/MEDQ  D:  08/28/2013  T:  08/29/2013  Job:  [id number]

## 2013-08-29 NOTE — Progress Notes (Signed)
eLink Physician-Brief Progress Note Patient Name: James LeydenChristopher D Mccormick DOB: 07/21/1966 MRN: [id number]  Date of Service  08/29/2013   HPI/Events of Note   New consult from [facility] Perforated gastric ulcer / likely peritonitis s/p exploratory lap Sepsis Acute respiratory failure   eICU Interventions   Seen by ICU resident team CVL placed A-line is being placed  Vent orders Sedation ( take of Propofol as hypotensive ) VTE / GI / VAP Px ABG / PCXR Neo-Synephrine PRN for goal MAP>65 Lactate pending Abx reviewed - appropriate To be seen by bedside MD    Intervention Category Major Interventions: Sepsis - evaluation and management;Respiratory failure - evaluation and management Intermediate Interventions: Respiratory distress - evaluation and management  James Mccormick 08/29/2013, 1:47 AM

## 2013-08-29 NOTE — Progress Notes (Addendum)
INITIAL NUTRITION ASSESSMENT  DOCUMENTATION CODES Per approved criteria  -Obesity Unspecified   INTERVENTION:  TPN per pharmacy RD to follow for nutrition care plan  NUTRITION DIAGNOSIS: Inadequate oral intake related to inability to eat as evidenced by NPO status  Goal: TPN to meet > 90% of estimated nutrition needs  Monitor:  TPN prescription, respiratory status, weight, labs, I/O's  Reason for Assessment: New TPN  48 y.o. male  Admitting Dx: Perforated stomach  ASSESSMENT: Patient with PMH of GERD, HTN presented with severe abdominal pain associated with nausea & vomiting; had been taking Alleve and large amount of Goody's powder.   CT scan revealed perforated stomach ulcer due to NSAID use.  Patient s/p procedures 1/8: EXPLORATORY LAPAROTOMY DEBRIDEMENT OF NECROTIC STOMACH REPAIR OF PERFORATED STOMACH  Patient is currently intubated on ventilator support -- NGT in place MV: 16.7 L/min Temp (24hrs), Avg:98.8 F (37.1 C), Min:97.7 F (36.5 C), Max:100.1 F (37.8 C)   Patient must be strict NPO until contrast study can be done to evaluate gastric repair; will be in several days.  Patient to receive TPN with Clinimix E 5/15 @ 40 ml/hr and lipids @ 10 ml/hr. Provides 1162 kcal, 48 gm protein daily. Meets 50% minimum estimated energy needs and 32% minimum estimated protein needs.  Height: Ht Readings from Last 1 Encounters:  08/28/13 5\' 11"  (1.803 m)    Weight: Wt Readings from Last 1 Encounters:  08/28/13 225 lb (102.059 kg)    Ideal Body Weight: 172 lb  % Ideal Body Weight: 131%  Wt Readings from Last 10 Encounters:  08/28/13 225 lb (102.059 kg)  08/28/13 225 lb (102.059 kg)    Usual Body Weight: unable to obtain  % Usual Body Weight: ---  BMI:  Body mass index is 31.39 kg/(m^2).  Estimated Nutritional Needs: Kcal: 2340 Protein: 150-160 gm Fluid: per MD  Skin: abdominal surgical incision   Diet Order: NPO  EDUCATION NEEDS: -No  education needs identified at this time   Intake/Output Summary (Last 24 hours) at 08/29/13 1135 Last data filed at 08/29/13 1100  Gross per 24 hour  Intake 6824.76 ml  Output   1560 ml  Net 5264.76 ml    Labs:   Recent Labs Lab 08/28/13 1625 08/29/13 0102 08/29/13 0420  NA 145 146 146  K 3.6* 3.5* 3.7  CL 109 109 112  CO2 19 24 18*  BUN 20 24* 24*  CREATININE 0.65 1.01 1.07  CALCIUM 5.8* 5.9* 5.6*  MG  --  1.5 1.6  PHOS  --   --  1.9*  GLUCOSE 129* 168* 239*    CBG (last 3)   Recent Labs  08/28/13 2320 08/29/13 0402 08/29/13 0733  GLUCAP 151* 179* 173*    Scheduled Meds: . antiseptic oral rinse  15 mL Mouth Rinse Q4H  . chlorhexidine  15 mL Mouth/Throat BID  . enoxaparin (LOVENOX) injection  40 mg Subcutaneous Q24H  . insulin aspart  1-3 Units Subcutaneous Q4H  . pantoprazole (PROTONIX) IV  40 mg Intravenous Q12H  . piperacillin-tazobactam (ZOSYN)  IV  3.375 g Intravenous Q8H  . sodium phosphate  Dextrose 5% IVPB  20 mmol Intravenous Once  . vancomycin  1,000 mg Intravenous Q8H    Continuous Infusions: . Marland KitchenTPN (CLINIMIX-E) Adult     And  . fat emulsion    . fentaNYL infusion INTRAVENOUS 75 mcg/hr (08/29/13 0700)  . midazolam (VERSED) infusion 1 mg/hr (08/29/13 0800)  . norepinephrine (LEVOPHED) Adult infusion 30 mcg/min (08/29/13 1128)  .  phenylephrine (NEO-SYNEPHRINE) Adult infusion 170 mcg/min (08/29/13 1100)  . vasopressin (PITRESSIN) infusion - *FOR SHOCK* 0.03 Units/min (08/29/13 1100)    Past Medical History  Diagnosis Date  . GERD (gastroesophageal reflux disease)   . Hypertension     Past Surgical History  Procedure Laterality Date  . Neck surgery      Maureen ChattersKatie Abhay Godbolt, RD, LDN Pager #: [phone] After-Hours Pager #: [phone]

## 2013-08-29 NOTE — Progress Notes (Signed)
  Patient back in NSR on amio. Will continue gtt.  Progressive acidosis in the face of systolic BPs in the 90s concerns me for ongoing bowel ischemia. GSU will re-evaluate tonight.   Truman Haywardaniel Bensimhon,MD 9:55 PM

## 2013-08-30 ENCOUNTER — Inpatient Hospital Stay (HOSPITAL_COMMUNITY): Payer: Medicaid Other

## 2013-08-30 DIAGNOSIS — E440 Moderate protein-calorie malnutrition: Secondary | ICD-10-CM

## 2013-08-30 DIAGNOSIS — R69 Illness, unspecified: Secondary | ICD-10-CM

## 2013-08-30 DIAGNOSIS — J9600 Acute respiratory failure, unspecified whether with hypoxia or hypercapnia: Secondary | ICD-10-CM

## 2013-08-30 DIAGNOSIS — I4891 Unspecified atrial fibrillation: Secondary | ICD-10-CM

## 2013-08-30 LAB — CBC
HCT: 42.9 % (ref 39.0–52.0)
HCT: 39 % (ref 39.0–52.0)
Hemoglobin: 14.7 g/dL (ref 13.0–17.0)
Hemoglobin: 13.3 g/dL (ref 13.0–17.0)
MCH: 30.8 pg (ref 26.0–34.0)
MCH: 31 pg (ref 26.0–34.0)
MCHC: 34.3 g/dL (ref 30.0–36.0)
MCHC: 34.1 g/dL (ref 30.0–36.0)
MCV: 89.9 fL (ref 78.0–100.0)
MCV: 90.9 fL (ref 78.0–100.0)
Platelets: 196 10*3/uL (ref 150–400)
Platelets: 160 10*3/uL (ref 150–400)
RBC: 4.77 MIL/uL (ref 4.22–5.81)
RBC: 4.29 MIL/uL (ref 4.22–5.81)
RDW: 14.1 % (ref 11.5–15.5)
RDW: 14.4 % (ref 11.5–15.5)
WBC: 6.5 10*3/uL (ref 4.0–10.5)
WBC: 9.7 10*3/uL (ref 4.0–10.5)

## 2013-08-30 LAB — POCT I-STAT 3, ART BLOOD GAS (G3+)
ACID-BASE DEFICIT: 2 mmol/L (ref 0.0–2.0)
Acid-Base Excess: 2 mmol/L (ref 0.0–2.0)
Acid-base deficit: 1 mmol/L (ref 0.0–2.0)
BICARBONATE: 23.6 meq/L (ref 20.0–24.0)
Bicarbonate: 26.7 mEq/L — ABNORMAL HIGH (ref 20.0–24.0)
Bicarbonate: 25.8 mEq/L — ABNORMAL HIGH (ref 20.0–24.0)
O2 SAT: 91 %
O2 Saturation: 89 %
O2 Saturation: 95 %
PCO2 ART: 53.4 mmHg — AB (ref 35.0–45.0)
PH ART: 7.302 — AB (ref 7.350–7.450)
PO2 ART: 75 mmHg — AB (ref 80.0–100.0)
Patient temperature: 98.6
Patient temperature: 102
TCO2: 25 mmol/L (ref 0–100)
TCO2: 28 mmol/L (ref 0–100)
TCO2: 27 mmol/L (ref 0–100)
pCO2 arterial: 42.6 mmHg (ref 35.0–45.0)
pCO2 arterial: 42.6 mmHg (ref 35.0–45.0)
pH, Arterial: 7.352 (ref 7.350–7.450)
pH, Arterial: 7.405 (ref 7.350–7.450)
pO2, Arterial: 60 mmHg — ABNORMAL LOW (ref 80.0–100.0)
pO2, Arterial: 73 mmHg — ABNORMAL LOW (ref 80.0–100.0)

## 2013-08-30 LAB — GLUCOSE, CAPILLARY
GLUCOSE-CAPILLARY: 204 mg/dL — AB (ref 70–99)
GLUCOSE-CAPILLARY: 176 mg/dL — AB (ref 70–99)
Glucose-Capillary: 156 mg/dL — ABNORMAL HIGH (ref 70–99)
Glucose-Capillary: 194 mg/dL — ABNORMAL HIGH (ref 70–99)
Glucose-Capillary: 216 mg/dL — ABNORMAL HIGH (ref 70–99)
Glucose-Capillary: 96 mg/dL (ref 70–99)

## 2013-08-30 LAB — COMPREHENSIVE METABOLIC PANEL
ALK PHOS: 34 U/L — AB (ref 39–117)
ALT: 41 U/L (ref 0–53)
ALT: 45 U/L (ref 0–53)
AST: 79 U/L — ABNORMAL HIGH (ref 0–37)
AST: 86 U/L — ABNORMAL HIGH (ref 0–37)
Albumin: 1.8 g/dL — ABNORMAL LOW (ref 3.5–5.2)
Albumin: 1.9 g/dL — ABNORMAL LOW (ref 3.5–5.2)
Alkaline Phosphatase: 38 U/L — ABNORMAL LOW (ref 39–117)
BUN: 34 mg/dL — ABNORMAL HIGH (ref 6–23)
BUN: 35 mg/dL — ABNORMAL HIGH (ref 6–23)
CO2: 24 meq/L (ref 19–32)
CO2: 24 meq/L (ref 19–32)
CREATININE: 1.64 mg/dL — AB (ref 0.50–1.35)
Calcium: 6.1 mg/dL — CL (ref 8.4–10.5)
Calcium: 5.9 mg/dL — CL (ref 8.4–10.5)
Chloride: 99 mEq/L (ref 96–112)
Chloride: 98 mEq/L (ref 96–112)
Creatinine, Ser: 1.62 mg/dL — ABNORMAL HIGH (ref 0.50–1.35)
GFR calc Af Amer: 56 mL/min — ABNORMAL LOW (ref >90)
GFR calc Af Amer: 55 mL/min — ABNORMAL LOW (ref >90)
GFR, EST NON AFRICAN AMERICAN: 48 mL/min — AB (ref >90)
GFR, EST NON AFRICAN AMERICAN: 48 mL/min — AB (ref >90)
GLUCOSE: 265 mg/dL — AB (ref 70–99)
Glucose, Bld: 192 mg/dL — ABNORMAL HIGH (ref 70–99)
POTASSIUM: 4 meq/L (ref 3.7–5.3)
Potassium: 3.9 mEq/L (ref 3.7–5.3)
SODIUM: 136 meq/L — AB (ref 137–147)
Sodium: 135 mEq/L — ABNORMAL LOW (ref 137–147)
TOTAL PROTEIN: 3.9 g/dL — AB (ref 6.0–8.3)
Total Bilirubin: 0.3 mg/dL (ref 0.3–1.2)
Total Bilirubin: 0.3 mg/dL (ref 0.3–1.2)
Total Protein: 4.1 g/dL — ABNORMAL LOW (ref 6.0–8.3)

## 2013-08-30 LAB — PHOSPHORUS: PHOSPHORUS: 3.9 mg/dL (ref 2.3–4.6)

## 2013-08-30 LAB — CORTISOL: Cortisol, Plasma: 28 ug/dL

## 2013-08-30 LAB — PROCALCITONIN: Procalcitonin: 180.37 ng/mL

## 2013-08-30 LAB — PREALBUMIN: Prealbumin: 8.9 mg/dL — ABNORMAL LOW (ref 17.0–34.0)

## 2013-08-30 LAB — TROPONIN I: TROPONIN I: 1.22 ng/mL — AB (ref <0.30)

## 2013-08-30 LAB — TRIGLYCERIDES: Triglycerides: 161 mg/dL — ABNORMAL HIGH (ref <150)

## 2013-08-30 LAB — VANCOMYCIN, TROUGH: Vancomycin Tr: 22.6 ug/mL — ABNORMAL HIGH (ref 10.0–20.0)

## 2013-08-30 LAB — MAGNESIUM: MAGNESIUM: 2 mg/dL (ref 1.5–2.5)

## 2013-08-30 MED ORDER — ALBUTEROL SULFATE (2.5 MG/3ML) 0.083% IN NEBU
2.5000 mg | INHALATION_SOLUTION | RESPIRATORY_TRACT | Status: DC | PRN
  Administered 2013-09-11: 2.5 mg via RESPIRATORY_TRACT
  Filled 2013-08-30: qty 3

## 2013-08-30 MED ORDER — AMIODARONE HCL IN DEXTROSE 360-4.14 MG/200ML-% IV SOLN
60.0000 mg/h | INTRAVENOUS | Status: AC
  Administered 2013-08-29: 200 mL via INTRAVENOUS
  Administered 2013-08-30: 60 mg/h via INTRAVENOUS
  Filled 2013-08-30: qty 200

## 2013-08-30 MED ORDER — ACETAMINOPHEN 650 MG RE SUPP
650.0000 mg | Freq: Four times a day (QID) | RECTAL | Status: DC | PRN
  Administered 2013-08-30 – 2013-10-02 (×6): 650 mg via RECTAL
  Filled 2013-08-30 (×9): qty 1

## 2013-08-30 MED ORDER — SODIUM CHLORIDE 0.9 % IV SOLN
1.0000 g | Freq: Once | INTRAVENOUS | Status: AC
  Administered 2013-08-30: 1 g via INTRAVENOUS
  Filled 2013-08-30: qty 10

## 2013-08-30 MED ORDER — TRACE MINERALS CR-CU-F-FE-I-MN-MO-SE-ZN IV SOLN
INTRAVENOUS | Status: AC
  Administered 2013-08-30: 18:00:00 via INTRAVENOUS
  Filled 2013-08-30: qty 1000

## 2013-08-30 MED ORDER — FAT EMULSION 20 % IV EMUL
240.0000 mL | INTRAVENOUS | Status: AC
  Administered 2013-08-30: 240 mL via INTRAVENOUS
  Filled 2013-08-30: qty 250

## 2013-08-30 MED ORDER — AMIODARONE HCL IN DEXTROSE 360-4.14 MG/200ML-% IV SOLN
30.0000 mg/h | INTRAVENOUS | Status: DC
  Administered 2013-08-30: 60 mg/h via INTRAVENOUS
  Administered 2013-08-31: 30 mg/h via INTRAVENOUS
  Filled 2013-08-30 (×5): qty 200

## 2013-08-30 MED ORDER — TRACE MINERALS CR-CU-F-FE-I-MN-MO-SE-ZN IV SOLN
INTRAVENOUS | Status: DC
  Filled 2013-08-30: qty 2000

## 2013-08-30 MED ORDER — ALBUMIN HUMAN 5 % IV SOLN
12.5000 g | Freq: Once | INTRAVENOUS | Status: AC
  Administered 2013-08-30: 12.5 g via INTRAVENOUS
  Filled 2013-08-30: qty 250

## 2013-08-30 MED ORDER — VANCOMYCIN HCL IN DEXTROSE 750-5 MG/150ML-% IV SOLN
750.0000 mg | Freq: Three times a day (TID) | INTRAVENOUS | Status: DC
  Administered 2013-08-30 – 2013-09-01 (×6): 750 mg via INTRAVENOUS
  Filled 2013-08-30 (×8): qty 150

## 2013-08-30 MED ORDER — FAT EMULSION 20 % IV EMUL
240.0000 mL | INTRAVENOUS | Status: DC
  Filled 2013-08-30: qty 250

## 2013-08-30 NOTE — Progress Notes (Addendum)
PARENTERAL NUTRITION CONSULT NOTE - FOLLOW UP  Pharmacy Consult for TPN Indication: Prolonged ileus  No Known Allergies  Patient Measurements: Height: 5\' 11"  (180.3 cm) Weight: 225 lb (102.059 kg) IBW/kg (Calculated) : 75.3   Vital Signs: Temp: 99.4 F (37.4 C) (01/10 0727) Temp src: Oral (01/10 0727) BP: 95/64 mmHg (01/10 1100) Pulse Rate: 90 (01/10 1100) Intake/Output from previous day: 01/09 0701 - 01/10 0700 In: 11648.8 [I.V.:9558.3; NG/GT:270; IV Piggyback:1170.5; TPN:650] Out: 2725 [Urine:955; Emesis/NG output:1200; Drains:570] Intake/Output from this shift:    Labs:  Recent Labs  08/29/13 0420 08/29/13 1600 08/30/13 0256  WBC 1.6* 4.7 6.5  HGB 16.4 14.9 14.7  HCT 48.5 43.5 42.9  PLT 169 197 196     Recent Labs  08/28/13 1625 08/29/13 0102 08/29/13 0420 08/29/13 1600 08/30/13 0256 08/30/13 0638  NA 145 146 146 146  --  136*  K 3.6* 3.5* 3.7 3.9  --  4.0  CL 109 109 112 112  --  99  CO2 19 24 18* 25  --  24  GLUCOSE 129* 168* 239* 124*  --  265*  BUN 20 24* 24* 29*  --  34*  CREATININE 0.65 1.01 1.07 1.42*  --  1.62*  CALCIUM 5.8* 5.9* 5.6* 9.5  --  6.1*  MG  --  1.5 1.6  --  2.0  --   PHOS  --   --  1.9*  --  3.9  --   PROT 5.4*  --  4.0*  --   --  3.9*  ALBUMIN 3.3*  --  2.4*  --   --  1.8*  AST 18  --  56*  --   --  79*  ALT 13  --  41  --   --  41  ALKPHOS 50  --  27*  --   --  34*  BILITOT 0.3  --  0.4  --   --  0.3  TRIG  --   --   --   --  161*  --    Estimated Creatinine Clearance: 67.1 ml/min (by C-G formula based on Cr of 1.62).    Recent Labs  08/29/13 2346 08/30/13 0331 08/30/13 0723  GLUCAP 194* 216* 204*    Medications:  Scheduled:  . antiseptic oral rinse  15 mL Mouth Rinse Q4H  . chlorhexidine  15 mL Mouth/Throat BID  . heparin subcutaneous  5,000 Units Subcutaneous Q8H  . insulin aspart  1-3 Units Subcutaneous Q4H  . micafungin (MYCAMINE) IV  100 mg Intravenous Q24H  . pantoprazole (PROTONIX) IV  40 mg  Intravenous Q12H  . piperacillin-tazobactam (ZOSYN)  IV  3.375 g Intravenous Q8H  . vancomycin  1,000 mg Intravenous Q8H    Insulin Requirements in the past 24 hours:  6 units Novolog SSI on TPN  Current Nutrition:  NPO   Assessment:  48 y.o. male presented to ED 1/8 with abd pain. CT consistent with perforated stomach ulcer due to NSAID use; pneumoperitoneum; peritonitis. Taken emergently to OR where he underwent exp lap, debridement of necrotic stomach, repair of perforated stomach. Pt must be strict NPO until contrast study can be done to eval gastric repair which will be several days from now. TPN to be started in the interim.   GI: NPO   Endo: No h/o DM.  CBGs 194, 216, 204.  Lytes: Phos 3.9- s/p replacement 1/9, Mg 2, Ca-corrected 7.9  Renal: Cr 1.62- increasing. UOP 0.714ml/kg/hr yest with just 40mL so  far today. Bicarb drip d/c'd.  Now on NS 175ml/hr & I/O [phone].  Discussed IVF with Dr. Craige Cotta & wishes to continue at current rate.  Mild anasarca  Pulm: VDRF since surgery. FiO2 70%   Cards: Pt on pressors (phenylephrine at 150 mcg/min, NE at 30 mcg/min, vaso 0.03 units/min), Amio for AFib.  Inc Trop- NSTEMI vs demand ischemia. HR 125-144. H/o HTN.   Hepatobil:  AST up some, otherwise LFTs wnl   Neuro: Sedated on fentanyl and versed. RASS goal -2.  ID: Vanc/Zosyn/Micafungin D#2 for perf stomach with necrosis. WBC low, Tm 100.7.   Best Practices: Enox, PPI IV, MC   TPN Access: CVC 08/29/13  TPN day#: 2   Nutritional Goals:  Goal 2300-2400kcal and 150-160gm protein per RD recommendations  Plan:  1. Will start clinimix E 5/15 at 40 ml/hr and lipids at 8ml/hr. Hold on advance at this time with cbg's > 200 and fluid status 2. Continue ICU-SSI q4h  3. Increase Insulin to 15units/L 4. F/U prealbumin  Marisue Humble, PharmD Clinical Pharmacist [facility]

## 2013-08-30 NOTE — Progress Notes (Signed)
  CRITICAL VALUE ALERT  Critical value received:  Troponin 1.22    Date of notification:  1/10  Time of notification: 810  Critical value read back:yes  Nurse who received alert:  Herbert DeanerPuja Emmarae Cowdery RN  MD notified (1st page):  Dr Craige CottaSood  Time of first page:  0810  MD notified (2nd page):  Time of second page:  Responding MD:  Dr Craige CottaSood  Time MD responded:  [phone]

## 2013-08-30 NOTE — Progress Notes (Signed)
2 Days Post-Op  Subjective: Patient remains intubated, sedated Down to three pressors - Levo, Neo, Vaso Atrial fibrillation - on Amiodarone per cardiology Elevated troponin - ?NSTEMI vs demand ischemia inadequate UOP  Objective: Vital signs in last 24 hours: Temp:  [98.6 F (37 C)-100.7 F (38.2 C)] 99.4 F (37.4 C) (01/10 0727) Pulse Rate:  [25-150] 109 (01/10 0715) Resp:  [0-35] 35 (01/10 0715) BP: (70-106)/(27-76) 99/61 mmHg (01/10 0700) SpO2:  [73 %-100 %] 93 % (01/10 0715) Arterial Line BP: (70-120)/(30-65) 108/60 mmHg (01/10 0715) FiO2 (%):  [60 %-70 %] 70 % (01/10 0341)    Intake/Output from previous day: 01/09 0701 - 01/10 0700 In: 11648.8 [I.V.:9558.3; NG/GT:270; IV Piggyback:1170.5; TPN:650] Out: 2725 [Urine:955; Emesis/NG output:1200; Drains:570] Intake/Output this shift:    General appearance: intubated, sedated Resp: bilateral rhonchi GI: distended; quiet; midline wound clean with minimal drainage; drains with serosanguinous output Mild anasarca  Lab Results:   Recent Labs  08/29/13 1600 08/30/13 0256  WBC 4.7 6.5  HGB 14.9 14.7  HCT 43.5 42.9  PLT 197 196   BMET  Recent Labs  08/29/13 1600 08/30/13 0638  NA 146 136*  K 3.9 4.0  CL 112 99  CO2 25 24  GLUCOSE 124* 265*  BUN 29* 34*  CREATININE 1.42* 1.62*  CALCIUM 9.5 6.1*   PT/INR No results found for this basename: LABPROT, INR,  in the last 72 hours ABG  Recent Labs  08/29/13 2351 08/30/13 0336  PHART 7.342* 7.352  HCO3 23.1 23.6    Studies/Results: Ct Abdomen Pelvis W Contrast  08/28/2013   CLINICAL DATA:  Abdominal pain and pneumoperitoneum.  EXAM: CT ABDOMEN AND PELVIS WITH CONTRAST  TECHNIQUE: Multidetector CT imaging of the abdomen and pelvis was performed using the standard protocol following bolus administration of intravenous contrast.  CONTRAST:  OMNIPAQUE IOHEXOL 300 MG/ML  SOLN  COMPARISON:  Abdominal radiograph 08/28/2013  FINDINGS: There is a small right  pleural effusion with extensive atelectasis at the right lung base. Mild atelectasis at the left lung base.  There is pneumoperitoneum with a collection a gas along the anterior abdomen and multiple locules of gas throughout the upper abdomen. There is a moderate amount of abdominal ascites. Mild distension of the esophagus which contains fluid. There may be wall thickening along the anterior gastric body and there is gas in the gastrohepatic ligament. There is edema and stranding throughout the anterior upper abdomen. There is an air-fluid level within the stomach and some of the fluid appears to have fatty content. No gross abnormality into the duodenum. Mild dilatation of small bowel loops. There is colonic diverticulosis but no evidence to suggest acute colonic inflammation. No gross abnormality to the appendix.  No significant lymphadenopathy. No gross abnormality to the prostate, seminal vesicles or urinary bladder. No gross abnormality to the liver, gallbladder or portal venous system. Normal appearance of the spleen, adrenal glands, kidneys and pancreas. Incidentally, there are punctate nonobstructive left kidney stones. No acute bone abnormality. Degenerative disc changes in the lower lumbar spine.  IMPRESSION: Study is positive for pneumoperitoneum and free fluid. Findings are suggestive for a perforated viscus. The majority of inflammatory changes and gas are located in the upper abdomen and centered around the stomach. There may be mild wall thickening along the anterior aspect of the stomach.  Colonic diverticulosis but there is not clear evidence for acute colonic inflammation.  Right basilar atelectasis with a small amount a right pleural fluid.  Mild dilatation of small bowel  loops could represent an ileus.  Nonobstructive left kidney stones.  Critical Value/emergent results were called by telephone at the time of interpretation on 08/28/2013 at 7:40 PM to Dr. Gaynelle AduEric Wilson, who verbally acknowledged  these results.   Electronically Signed   By: Richarda OverlieAdam  Henn M.D.   On: 08/28/2013 19:45   Dg Chest Port 1 View  08/30/2013   CLINICAL DATA:  Intubated, respiratory support  EXAM: PORTABLE CHEST - 1 VIEW  COMPARISON:  08/29/2013  FINDINGS: Endotracheal tube 5.7 cm above the carina. Low lung volumes persist with increased right mid lung atelectasis along the minor fissure. No enlarging effusion or pneumothorax. Stable heart size and vascularity. NG tube within the stomach. Right IJ central line tip distal SVC.  IMPRESSION: Increased right midlung atelectasis.  Stable low lung volumes.   Electronically Signed   By: Ruel Favorsrevor  Shick M.D.   On: 08/30/2013 07:18   Dg Chest Port 1 View  08/29/2013   CLINICAL DATA:  Exploratory laparotomy.  EXAM: PORTABLE CHEST - 1 VIEW  COMPARISON:  August 29, 2013 1:20 a.m.  FINDINGS: Endotracheal tube, right central venous line, nasogastric tube are stable compared to prior exam. There is increased pulmonary interstitium bilaterally. There is probably a small right pleural effusion. The previously noted atelectasis of the right mid lung is less prominent. The mediastinal contour and heart size are stable.  IMPRESSION: Mild pulmonary vascular congestion. Probable small right pleural effusion.   Electronically Signed   By: Sherian ReinWei-Chen  Lin M.D.   On: 08/29/2013 07:11   Dg Chest Port 1 View  08/29/2013   CLINICAL DATA:  Central catheter placement  EXAM: PORTABLE CHEST - 1 VIEW  COMPARISON:  August 28, 2013  FINDINGS: There is a central catheter with tip at the cavoatrial junction. Endotracheal tube tip is 3.6 cm above the carina. Nasogastric tube tip and side port are below the diaphragm in the stomach region. No pneumothorax.  There is patchy atelectasis in the right mid lung. Lungs are otherwise clear. Heart is upper normal in size with normal pulmonary vascularity. No adenopathy.  IMPRESSION: Tube and catheter positions as described. Note that the new central catheter tip is at the  cavoatrial junction. No pneumothorax. Stable right midlung atelectatic change.   Electronically Signed   By: Bretta BangWilliam  Woodruff M.D.   On: 08/29/2013 01:29   Dg Chest Port 1 View  08/29/2013   CLINICAL DATA:  Hypoxia  EXAM: PORTABLE CHEST - 1 VIEW  COMPARISON:  Study obtained earlier in the day  FINDINGS: Endotracheal tube is 3.3 cm above the carina. Nasogastric tube tip and side port are in the stomach. No pneumothorax.  There is atelectatic change in the right mid lung. Lungs are otherwise clear. Heart size and pulmonary vascularity are normal. No adenopathy.  IMPRESSION: Tube positions as described without pneumothorax. Mild right midlung atelectasis. Elsewhere lungs are clear.   Electronically Signed   By: Bretta BangWilliam  Woodruff M.D.   On: 08/29/2013 00:02   Dg Abd Acute W/chest  08/28/2013   CLINICAL DATA:  Severe abdominal pain.  EXAM: ACUTE ABDOMEN SERIES (ABDOMEN 2 VIEW & CHEST 1 VIEW)  COMPARISON:  None.  FINDINGS: The left lower decubitus image demonstrates lucency in the right upper abdomen and most compatible with free air. Chest radiograph demonstrates very low lung volumes. Prominent interstitial markings are probably related to the low lung volumes. Heart size is within normal limits. Small amount of bowel gas in the abdomen. There is stool along the right side of  the colon.  IMPRESSION: Study is positive for free air. Findings are concerning for a bowel perforation and recommend surgical consultation.  Low lung volumes.  Critical Value/emergent results were called by telephone at the time of interpretation on 08/28/2013 at 6:27 PM to Dr. Trixie Dredge , who verbally acknowledged these results.   Electronically Signed   By: Richarda Overlie M.D.   On: 08/28/2013 18:28    Anti-infectives: Anti-infectives   Start     Dose/Rate Route Frequency Ordered Stop   08/29/13 2200  micafungin (MYCAMINE) 100 mg in sodium chloride 0.9 % 100 mL IVPB     100 mg 100 mL/hr over 1 Hours Intravenous Every 24 hours 08/29/13  2036     08/29/13 0400  vancomycin (VANCOCIN) IVPB 1000 mg/200 mL premix     1,000 mg 200 mL/hr over 60 Minutes Intravenous Every 8 hours 08/28/13 2358     08/29/13 0400  piperacillin-tazobactam (ZOSYN) IVPB 3.375 g     3.375 g 12.5 mL/hr over 240 Minutes Intravenous 3 times per day 08/28/13 2358     08/28/13 1845  [MAR Hold]  vancomycin (VANCOCIN) IVPB 1000 mg/200 mL premix     (On MAR Hold since 08/28/13 1957)   1,000 mg 200 mL/hr over 60 Minutes Intravenous  Once 08/28/13 1831 08/28/13 1955   08/28/13 1845  [MAR Hold]  piperacillin-tazobactam (ZOSYN) IVPB 3.375 g     (On MAR Hold since 08/28/13 1957)   3.375 g 100 mL/hr over 30 Minutes Intravenous  Once 08/28/13 1831 08/28/13 2056      Assessment/Plan: s/p Procedure(s): EXPLORATORY LAPAROTOMY,debridment of nacrotic stomach,and primary repair of perforated stomach. (N/A) Continue drains, antibiotics for now Cardiopulmonary management per CCM - consider albumin bolus Remains critically ill - I updated the daughter at the bedside.  LOS: 2 days    Amya Hlad K. 08/30/2013

## 2013-08-30 NOTE — Progress Notes (Signed)
CRITICAL VALUE ALERT  Critical value received:  Calcium 6.1  Date of notification:  1/10   Time of notification:  0747  Critical value read back:yes  Nurse who received alert:  Eloise HarmanAlan Currin RN  MD notified (1st page):  Dr. Craige CottaSood  Time of first page:  0800  MD notified (2nd page):  Time of second page:  Responding MD:  Dr. Craige CottaSood  Time MD responded:  [phone]

## 2013-08-30 NOTE — ED Provider Notes (Signed)
Medical screening examination/treatment/procedure(s) were conducted as a shared visit with non-physician practitioner(s) and myself.  I personally evaluated the patient during the encounter.  EKG Interpretation    Date/Time:  Thursday August 28 2013 15:57:53 EST Ventricular Rate:  92 PR Interval:  152 QRS Duration: 94 QT Interval:  410 QTC Calculation: 507 R Axis:   7 Text Interpretation:  Sinus rhythm Probable left atrial enlargement Abnormal R-wave progression, early transition Prolonged QT interval Baseline wander in lead(s) V2 V6 Confirmed by Zakery Normington  MD, Artina Minella ([id number]) on 08/28/2013 4:01:38 PM            I interviewed and examined the patient. Lungs are CTAB. Cardiac exam wnl. Abdomen soft, diffusely ttp. Plain films concerning for free air. GSU consulted. Will get CT and admit.   Junius ArgyleForrest S Chrystian Cupples, MD 08/30/13 [phone]

## 2013-08-30 NOTE — Progress Notes (Signed)
Per CARDS, amio drip dose changed to 30mg /hr at 1400. Patient converted in NSR at 1030 after albumin x 1 was given

## 2013-08-30 NOTE — Progress Notes (Signed)
ANTIBIOTIC CONSULT NOTE - Follow up  Pharmacy Consult for Vancomycin/Zosyn Indication: Peritonitis   No Known Allergies  Patient Measurements: Height: 5\' 11"  (180.3 cm) Weight: 225 lb (102.059 kg) IBW/kg (Calculated) : 75.3  Vital Signs: Temp: 99.9 F (37.7 C) (01/10 1126) Temp src: Oral (01/10 1126) BP: 95/64 mmHg (01/10 1100) Pulse Rate: 90 (01/10 1100)  Labs:  Recent Labs  08/29/13 0420 08/29/13 1600 08/30/13 0256 08/30/13 0638 08/30/13 1147  WBC 1.6* 4.7 6.5  --   --   HGB 16.4 14.9 14.7  --   --   PLT 169 197 196  --   --   CREATININE 1.07 1.42*  --  1.62* 1.64*   Estimated Creatinine Clearance: 66.3 ml/min (by C-G formula based on Cr of 1.64).  Medical History: Past Medical History  Diagnosis Date  . GERD (gastroesophageal reflux disease)   . Hypertension    Assessment: 48 y/o M s/p OR for perforated stomach with necrosis to continue broad spectrum antibiotics. WBC wnl, SCr up to 1.64. Max temp 100.7 F, LA 4.1  Vancomycin trough (1/20): 22.6 on Vanc 1000 mg IV Q 8 hours. Vanc 1/8>> Zosyn 1/8>> Micafungin 1/9>>  1/9 MRSA - NEG 1/9 Blood>>   Goal of Therapy:  Vancomycin trough level 15-20 mcg/ml  Plan:  1. Decrease vanc to 750 mg IV Q8H  2. Continue zosyn 3.375gm IV Q8H (4 hr inf) 3. F/u renal fxn, C&S, clinical status and trough at SS 4. Micafungin 100mg  IV q24   Vinnie LevelBenjamin Lamari Beckles, PharmD.  Clinical Pharmacist Pager [phone]

## 2013-08-30 NOTE — Progress Notes (Signed)
SUBJECTIVE: Intubated and sedated; requiring pressor support  Patient admitted 1-8 with perforated stomach ulcer. Underwent exploratory laparotomy and repair of ulcer on day of admission.  He has had persistent peritonitis and shock; developed afib with RVR yesterday and failed emergent cardioversion.  Currently amio loading with conversion to SR this morning at 10:30 after albumin.  Troponin elevated this morning.   CURRENT MEDICATIONS: . antiseptic oral rinse  15 mL Mouth Rinse Q4H  . chlorhexidine  15 mL Mouth/Throat BID  . heparin subcutaneous  5,000 Units Subcutaneous Q8H  . insulin aspart  1-3 Units Subcutaneous Q4H  . micafungin (MYCAMINE) IV  100 mg Intravenous Q24H  . pantoprazole (PROTONIX) IV  40 mg Intravenous Q12H  . piperacillin-tazobactam (ZOSYN)  IV  3.375 g Intravenous Q8H  . vancomycin  1,000 mg Intravenous Q8H   . sodium chloride 125 mL/hr at 08/30/13 0800  . amiodarone (NEXTERONE PREMIX) 360 mg/200 mL dextrose 60 mg/hr (08/30/13 0700)  . epinephrine 1 mcg/min (08/30/13 0200)  . Marland Kitchen.TPN (CLINIMIX-E) Adult     And  . fat emulsion    . Marland Kitchen.TPN (CLINIMIX-E) Adult 40 mL/hr at 08/30/13 0200   And  . fat emulsion 250 mL (08/30/13 0200)  . fentaNYL infusion INTRAVENOUS 150 mcg/hr (08/30/13 0200)  . midazolam (VERSED) infusion 2 mg/hr (08/30/13 0200)  . norepinephrine (LEVOPHED) Adult infusion 35 mcg/min (08/30/13 0200)  . phenylephrine (NEO-SYNEPHRINE) Adult infusion 175 mcg/min (08/30/13 0900)  . vasopressin (PITRESSIN) infusion - *FOR SHOCK* 0.03 Units/min (08/30/13 0537)    OBJECTIVE: Physical Exam: Filed Vitals:   08/30/13 0900 08/30/13 1000 08/30/13 1100 08/30/13 1126  BP: 94/59 86/52 95/64    Pulse: 104 114 90   Temp:    99.9 F (37.7 C)  TempSrc:    Oral  Resp: 24 0 0   Height:      Weight:      SpO2: 95% 95% 100%     Intake/Output Summary (Last 24 hours) at 08/30/13 1209 Last data filed at 08/30/13 0715  Gross per 24 hour  Intake 10330.43 ml    Output   2055 ml  Net 8275.43 ml    Telemetry reveals atrial fibrillation with RVR, reversion to SR this morning at 10:30  GEN- intubated and sedated, ill appearing Head- normocephalic, atraumatic, ETT in place Eyes-  Sclera clear, conjunctiva pink Oropharynx- clear Neck- supple,   Lungs- Clear to ausculation bilaterally Heart- regular rate and rhythm  GI- distended with dressings, quiet  Extremities- no clubbing, cyanosis, or edema  LABS: Basic Metabolic Panel:  Recent Labs  06/16/2500/09/15 0420 08/29/13 1600 08/30/13 0256 08/30/13 0638  NA 146 146  --  136*  K 3.7 3.9  --  4.0  CL 112 112  --  99  CO2 18* 25  --  24  GLUCOSE 239* 124*  --  265*  BUN 24* 29*  --  34*  CREATININE 1.07 1.42*  --  1.62*  CALCIUM 5.6* 9.5  --  6.1*  MG 1.6  --  2.0  --   PHOS 1.9*  --  3.9  --    Liver Function Tests:  Recent Labs  08/29/13 0420 08/30/13 0638  AST 56* 79*  ALT 41 41  ALKPHOS 27* 34*  BILITOT 0.4 0.3  PROT 4.0* 3.9*  ALBUMIN 2.4* 1.8*    Recent Labs  08/28/13 1625 08/29/13 0930  LIPASE 30 14   CBC:  Recent Labs  08/28/13 1625 08/28/13 2359  08/29/13 1600 08/30/13 0256  WBC  6.2 2.1*  < > 4.7 6.5  NEUTROABS 4.4 1.3*  --   --   --   HGB 16.0 16.8  < > 14.9 14.7  HCT 46.1 48.7  < > 43.5 42.9  MCV 90.2 91.4  < > 89.7 89.9  PLT 190 176  < > 197 196  < > = values in this interval not displayed. Cardiac Enzymes:  Recent Labs  08/29/13 1933 08/30/13 0256 08/30/13 0638  TROPONINI <0.30 <0.30 1.22*   Fasting Lipid Panel:  Recent Labs  08/30/13 0256  TRIG 161*    ASSESSMENT AND PLAN:  Principal Problem:   Perforated stomach Active Problems:   Septic shock(785.52)   Severe sepsis(995.92)   ARDS (adult respiratory distress syndrome)   Hypocalcemia   Hypophosphatemia   Protein-calorie malnutrition, moderate   Atrial fibrillation  1. afib Now back in sinus Not a candidate for anticoagulation Afib is driven by medical illness and should  resolve as he improves Decrease amiodarone to 30mg /hour and continue until taking POs  Other issues per surgery and PCCM

## 2013-08-30 NOTE — Progress Notes (Signed)
eLink Physician-Brief Progress Note Patient Name: James LeydenChristopher D Mccormick DOB: 08/05/1966 MRN: [id number]  Date of Service  08/30/2013   HPI/Events of Note   FEver  eICU Interventions  Treat aggressively with tylenol cooling blanket, since tachyrrhythmias Replete Ca   Intervention Category Intermediate Interventions: Electrolyte abnormality - evaluation and management Minor Interventions: Routine modifications to care plan (e.g. PRN medications for pain, fever)  James Mccormick V. 08/30/2013, 3:39 PM

## 2013-08-30 NOTE — Progress Notes (Signed)
CRITICAL VALUE ALERT  Critical value received:  Calcium 5.9   Date of notification:  1/10  Time of notification:  1228   Critical value read back:yes  Nurse who received alert:  P.Beautiful Pensyl   MD notified (1st page):  Dr. Craige CottaSood  Time of first page:  1230  MD notified (2nd page):  Time of second page:  Responding MD: Dr. Craige CottaSood  Time MD responded:  1230

## 2013-08-30 NOTE — Progress Notes (Signed)
Called CCM MD about patient having a fever of 102, received orders for PR APAP, cooling blanket, and ice packs.  Also received orders for Calcium.

## 2013-08-30 NOTE — Consult Note (Signed)
Name: James Mccormick MRN: [id number] DOB: 12/17/1965    ADMISSION DATE:  08/28/2013 CONSULTATION DATE:  08/28/13  REFERRING MD :  Andrey Campanile PRIMARY SERVICE: [facility]  CHIEF COMPLAINT:  Abdominal Pain  BRIEF PATIENT DESCRIPTION:  48 yo male smoker presented with abdominal pain from perforated gastric ulcer in setting of NSAID.  PCCM consulted to assist with septic shock and respiratory failure management.  SIGNIFICANT EVENTS: 1/8 Exploratory laparotomy, debridement of necrotic stomach with scissors, primary repair of perforated stomach in 2 layers.  Septic shock, VDRF, a fib post-op. 1/9 Cardiology consulted   STUDIES:  1/8 CT abd/pelvis >> pneumoperitoneum, Lt kidney stone, diverticulosis 1/9 Echo >> mild LVH, EF 40 to 45%, grade 1 diastolic dysfx  LINES / TUBES: 1/8 ETT >>  1/8 Rt IJ CVL >> 1/8 Lt radial aline >>   CULTURES: Blood 1/9 >>  ANTIBIOTICS: Vancomycin 1/8 >>>> Zosyn 1/8 >>>> Micafungin 1/9 >>>>  SUBJECTIVE:  Remains on multiple pressors.  Making urine.  Air trapping on vent.  VITAL SIGNS: Temp:  [98.6 F (37 C)-100.7 F (38.2 C)] 99.4 F (37.4 C) (01/10 0727) Pulse Rate:  [25-150] 109 (01/10 0715) Resp:  [0-35] 35 (01/10 0715) BP: (70-106)/(27-76) 99/61 mmHg (01/10 0700) SpO2:  [73 %-100 %] 93 % (01/10 0715) Arterial Line BP: (70-120)/(30-65) 108/60 mmHg (01/10 0715) FiO2 (%):  [60 %-70 %] 70 % (01/10 0341) HEMODYNAMICS: CVP:  [4 mmHg-16 mmHg] 16 mmHg VENTILATOR SETTINGS: Vent Mode:  [-] PRVC FiO2 (%):  [60 %-70 %] 70 % Set Rate:  [28 bmp-35 bmp] 35 bmp Vt Set:  [450 mL-530 mL] 450 mL PEEP:  [10 cmH20-12 cmH20] 12 cmH20 Plateau Pressure:  [23 cmH20-29 cmH20] 29 cmH20 INTAKE / OUTPUT: Intake/Output     01/09 0701 - 01/10 0700 01/10 0701 - 01/11 0700   I.V. (mL/kg) 9558.3 (93.7)    NG/GT 270    IV Piggyback 1170.5    TPN 650    Total Intake(mL/kg) 11648.8 (114.1)    Urine (mL/kg/hr) 955 (0.4)    Emesis/NG output 1200 (0.5)    Drains 570 (0.2)     Total Output 2725     Net +8923.8            PHYSICAL EXAMINATION: General: no distress Neuro:  RASS -3 HEENT: ETT in place Cardiovascular: irregular, tachycardic Lungs: b/l rhonchi Abdomen: wound dressing clean Musculoskeletal: 1+ edema Skin: lower extremities cool  LABS:  CBC  Recent Labs Lab 08/29/13 0420 08/29/13 1600 08/30/13 0256  WBC 1.6* 4.7 6.5  HGB 16.4 14.9 14.7  HCT 48.5 43.5 42.9  PLT 169 197 196   BMET  Recent Labs Lab 08/29/13 0420 08/29/13 1600 08/30/13 0638  NA 146 146 136*  K 3.7 3.9 4.0  CL 112 112 99  CO2 18* 25 24  BUN 24* 29* 34*  CREATININE 1.07 1.42* 1.62*  GLUCOSE 239* 124* 265*   Electrolytes  Recent Labs Lab 08/29/13 0102 08/29/13 0420 08/29/13 1600 08/30/13 0256 08/30/13 0638  CALCIUM 5.9* 5.6* 9.5  --  6.1*  MG 1.5 1.6  --  2.0  --   PHOS  --  1.9*  --  3.9  --    Liver Enzymes  Recent Labs Lab 08/28/13 1625 08/29/13 0420 08/30/13 0638  AST 18 56* 79*  ALT 13 41 41  ALKPHOS 50 27* 34*  BILITOT 0.3 0.4 0.3  ALBUMIN 3.3* 2.4* 1.8*    Imaging Ct Abdomen Pelvis W Contrast  08/28/2013   CLINICAL DATA:  Abdominal pain and pneumoperitoneum.  EXAM: CT ABDOMEN AND PELVIS WITH CONTRAST  TECHNIQUE: Multidetector CT imaging of the abdomen and pelvis was performed using the standard protocol following bolus administration of intravenous contrast.  CONTRAST:  OMNIPAQUE IOHEXOL 300 MG/ML  SOLN  COMPARISON:  Abdominal radiograph 08/28/2013  FINDINGS: There is a small right pleural effusion with extensive atelectasis at the right lung base. Mild atelectasis at the left lung base.  There is pneumoperitoneum with a collection a gas along the anterior abdomen and multiple locules of gas throughout the upper abdomen. There is a moderate amount of abdominal ascites. Mild distension of the esophagus which contains fluid. There may be wall thickening along the anterior gastric body and there is gas in the gastrohepatic ligament.  There is edema and stranding throughout the anterior upper abdomen. There is an air-fluid level within the stomach and some of the fluid appears to have fatty content. No gross abnormality into the duodenum. Mild dilatation of small bowel loops. There is colonic diverticulosis but no evidence to suggest acute colonic inflammation. No gross abnormality to the appendix.  No significant lymphadenopathy. No gross abnormality to the prostate, seminal vesicles or urinary bladder. No gross abnormality to the liver, gallbladder or portal venous system. Normal appearance of the spleen, adrenal glands, kidneys and pancreas. Incidentally, there are punctate nonobstructive left kidney stones. No acute bone abnormality. Degenerative disc changes in the lower lumbar spine.  IMPRESSION: Study is positive for pneumoperitoneum and free fluid. Findings are suggestive for a perforated viscus. The majority of inflammatory changes and gas are located in the upper abdomen and centered around the stomach. There may be mild wall thickening along the anterior aspect of the stomach.  Colonic diverticulosis but there is not clear evidence for acute colonic inflammation.  Right basilar atelectasis with a small amount a right pleural fluid.  Mild dilatation of small bowel loops could represent an ileus.  Nonobstructive left kidney stones.  Critical Value/emergent results were called by telephone at the time of interpretation on 08/28/2013 at 7:40 PM to Dr. Gaynelle Adu, who verbally acknowledged these results.   Electronically Signed   By: Richarda Overlie M.D.   On: 08/28/2013 19:45   Dg Chest Port 1 View  08/30/2013   CLINICAL DATA:  Intubated, respiratory support  EXAM: PORTABLE CHEST - 1 VIEW  COMPARISON:  08/29/2013  FINDINGS: Endotracheal tube 5.7 cm above the carina. Low lung volumes persist with increased right mid lung atelectasis along the minor fissure. No enlarging effusion or pneumothorax. Stable heart size and vascularity. NG tube  within the stomach. Right IJ central line tip distal SVC.  IMPRESSION: Increased right midlung atelectasis.  Stable low lung volumes.   Electronically Signed   By: Ruel Favors M.D.   On: 08/30/2013 07:18   Dg Chest Port 1 View  08/29/2013   CLINICAL DATA:  Exploratory laparotomy.  EXAM: PORTABLE CHEST - 1 VIEW  COMPARISON:  August 29, 2013 1:20 a.m.  FINDINGS: Endotracheal tube, right central venous line, nasogastric tube are stable compared to prior exam. There is increased pulmonary interstitium bilaterally. There is probably a small right pleural effusion. The previously noted atelectasis of the right mid lung is less prominent. The mediastinal contour and heart size are stable.  IMPRESSION: Mild pulmonary vascular congestion. Probable small right pleural effusion.   Electronically Signed   By: Sherian Rein M.D.   On: 08/29/2013 07:11   Dg Chest Port 1 View  08/29/2013   CLINICAL DATA:  Central catheter placement  EXAM: PORTABLE CHEST - 1 VIEW  COMPARISON:  August 28, 2013  FINDINGS: There is a central catheter with tip at the cavoatrial junction. Endotracheal tube tip is 3.6 cm above the carina. Nasogastric tube tip and side port are below the diaphragm in the stomach region. No pneumothorax.  There is patchy atelectasis in the right mid lung. Lungs are otherwise clear. Heart is upper normal in size with normal pulmonary vascularity. No adenopathy.  IMPRESSION: Tube and catheter positions as described. Note that the new central catheter tip is at the cavoatrial junction. No pneumothorax. Stable right midlung atelectatic change.   Electronically Signed   By: Bretta BangWilliam  Woodruff M.D.   On: 08/29/2013 01:29   Dg Chest Port 1 View  08/29/2013   CLINICAL DATA:  Hypoxia  EXAM: PORTABLE CHEST - 1 VIEW  COMPARISON:  Study obtained earlier in the day  FINDINGS: Endotracheal tube is 3.3 cm above the carina. Nasogastric tube tip and side port are in the stomach. No pneumothorax.  There is atelectatic change in the  right mid lung. Lungs are otherwise clear. Heart size and pulmonary vascularity are normal. No adenopathy.  IMPRESSION: Tube positions as described without pneumothorax. Mild right midlung atelectasis. Elsewhere lungs are clear.   Electronically Signed   By: Bretta BangWilliam  Woodruff M.D.   On: 08/29/2013 00:02   Dg Abd Acute W/chest  08/28/2013   CLINICAL DATA:  Severe abdominal pain.  EXAM: ACUTE ABDOMEN SERIES (ABDOMEN 2 VIEW & CHEST 1 VIEW)  COMPARISON:  None.  FINDINGS: The left lower decubitus image demonstrates lucency in the right upper abdomen and most compatible with free air. Chest radiograph demonstrates very low lung volumes. Prominent interstitial markings are probably related to the low lung volumes. Heart size is within normal limits. Small amount of bowel gas in the abdomen. There is stool along the right side of the colon.  IMPRESSION: Study is positive for free air. Findings are concerning for a bowel perforation and recommend surgical consultation.  Low lung volumes.  Critical Value/emergent results were called by telephone at the time of interpretation on 08/28/2013 at 6:27 PM to Dr. Trixie DredgeEMILY WEST , who verbally acknowledged these results.   Electronically Signed   By: Richarda OverlieAdam  Henn M.D.   On: 08/28/2013 18:28     ASSESSMENT / PLAN:  PULMONARY A:  Acute respiratory failure in setting of peritonitis. Hx of smoking >> air trapping on vent 1/10. P:   -change RR to 24 and Vt to 520 on 1/10 -f/u CXR, ABG -adjust PEEP, FiO2 to keep SpO2 > 92%, PaO2 > 60 mmH -prn BDs  CARDIOVASCULAR A:  Septic shock 2nd to peritonitis. A fib with RVR. Acute systolic heart failure likely 2nd to sepsis. NSTEMI from demand ischemia. Hx of HTN with chronic diastolic dysfx. P:  -goal CVP 8 to 12 -wean pressors to keep SBP > 90, MAP > 65 -amiodarone per cardiology  RENAL A:   Acute kidney injury 2nd to hypovolemia, and ATN in setting of shock. Metabolic acidosis >> improved. P:   -monitor renal fx, urine  outpt -f/u and replace electrolytes as needed -optimize hemodynamics >> no indication for renal replacement at this time -d/c HCO3 gtt  GASTROINTESTINAL A:   Perforated gastric ulcer in setting of NSAID use as outpt. Protein calorie malnutrition. P:   -post -op care per [facility] -protonix BID -continue TNA per [facility]  HEMATOLOGIC A:   No acute issues. P:  -f/u CBC -SQ heparin for DVT  prevention  INFECTIOUS A:  Septic shock 2nd to peritonitis. P:   -continue vancomycin, zosyn, micafungin -f/u procalcitonin  ENDOCRINE A:   Hyperglycemia. ?relative adrenal insufficiency >> cortisol 39.5 from 1/9. P:   -SSI -d/c solu cortef 1/10  NEUROLOGIC A:   Acute encephalopathy 2nd to septic shock. P:   -goal RASS -2   CC time 40 minutes.  Coralyn Helling, MD [facility] Pulmonary/Critical Care 08/30/2013, 8:24 AM Pager:  [phone] After 3pm call: [phone]

## 2013-08-30 NOTE — Progress Notes (Addendum)
Patient went back into AFIB around 0700, amio drip was increased back to 60mg /hr and paged CARDS, per CARDS MD, leave patient on 60mg /hr until CARDS sees the patient.

## 2013-08-31 ENCOUNTER — Inpatient Hospital Stay (HOSPITAL_COMMUNITY): Payer: Medicaid Other

## 2013-08-31 DIAGNOSIS — E46 Unspecified protein-calorie malnutrition: Secondary | ICD-10-CM

## 2013-08-31 DIAGNOSIS — A419 Sepsis, unspecified organism: Secondary | ICD-10-CM

## 2013-08-31 DIAGNOSIS — R7989 Other specified abnormal findings of blood chemistry: Secondary | ICD-10-CM

## 2013-08-31 DIAGNOSIS — K650 Generalized (acute) peritonitis: Secondary | ICD-10-CM

## 2013-08-31 LAB — CBC
HCT: 35.3 % — ABNORMAL LOW (ref 39.0–52.0)
Hemoglobin: 12.4 g/dL — ABNORMAL LOW (ref 13.0–17.0)
MCH: 31.6 pg (ref 26.0–34.0)
MCHC: 35.1 g/dL (ref 30.0–36.0)
MCV: 90.1 fL (ref 78.0–100.0)
PLATELETS: 136 10*3/uL — AB (ref 150–400)
RBC: 3.92 MIL/uL — ABNORMAL LOW (ref 4.22–5.81)
RDW: 14.7 % (ref 11.5–15.5)
WBC: 6.7 10*3/uL (ref 4.0–10.5)

## 2013-08-31 LAB — COMPREHENSIVE METABOLIC PANEL
ALBUMIN: 1.8 g/dL — AB (ref 3.5–5.2)
ALT: 61 U/L — ABNORMAL HIGH (ref 0–53)
AST: 97 U/L — AB (ref 0–37)
Alkaline Phosphatase: 43 U/L (ref 39–117)
BILIRUBIN TOTAL: 0.5 mg/dL (ref 0.3–1.2)
BUN: 39 mg/dL — ABNORMAL HIGH (ref 6–23)
CHLORIDE: 96 meq/L (ref 96–112)
CO2: 23 mEq/L (ref 19–32)
CREATININE: 1.73 mg/dL — AB (ref 0.50–1.35)
Calcium: 6.3 mg/dL — CL (ref 8.4–10.5)
GFR calc Af Amer: 52 mL/min — ABNORMAL LOW (ref >90)
GFR calc non Af Amer: 45 mL/min — ABNORMAL LOW (ref >90)
Glucose, Bld: 147 mg/dL — ABNORMAL HIGH (ref 70–99)
Potassium: 3.6 mEq/L — ABNORMAL LOW (ref 3.7–5.3)
Sodium: 133 mEq/L — ABNORMAL LOW (ref 137–147)
Total Protein: 4.2 g/dL — ABNORMAL LOW (ref 6.0–8.3)

## 2013-08-31 LAB — POCT I-STAT 3, ART BLOOD GAS (G3+)
Bicarbonate: 24.9 mEq/L — ABNORMAL HIGH (ref 20.0–24.0)
O2 Saturation: 95 %
PO2 ART: 73 mmHg — AB (ref 80.0–100.0)
Patient temperature: 97.9
TCO2: 26 mmol/L (ref 0–100)
pCO2 arterial: 41.1 mmHg (ref 35.0–45.0)
pH, Arterial: 7.389 (ref 7.350–7.450)

## 2013-08-31 LAB — BASIC METABOLIC PANEL
BUN: 37 mg/dL — AB (ref 6–23)
BUN: 37 mg/dL — ABNORMAL HIGH (ref 6–23)
CALCIUM: 6.5 mg/dL — AB (ref 8.4–10.5)
CO2: 21 meq/L (ref 19–32)
CO2: 22 meq/L (ref 19–32)
Calcium: 6 mg/dL — CL (ref 8.4–10.5)
Chloride: 98 mEq/L (ref 96–112)
Chloride: 94 mEq/L — ABNORMAL LOW (ref 96–112)
Creatinine, Ser: 1.4 mg/dL — ABNORMAL HIGH (ref 0.50–1.35)
Creatinine, Ser: 1.4 mg/dL — ABNORMAL HIGH (ref 0.50–1.35)
GFR calc Af Amer: 67 mL/min — ABNORMAL LOW (ref >90)
GFR, EST AFRICAN AMERICAN: 67 mL/min — AB (ref >90)
GFR, EST NON AFRICAN AMERICAN: 58 mL/min — AB (ref >90)
GFR, EST NON AFRICAN AMERICAN: 58 mL/min — AB (ref >90)
GLUCOSE: 149 mg/dL — AB (ref 70–99)
Glucose, Bld: 120 mg/dL — ABNORMAL HIGH (ref 70–99)
Potassium: 3.7 mEq/L (ref 3.7–5.3)
Potassium: 3.9 mEq/L (ref 3.7–5.3)
SODIUM: 132 meq/L — AB (ref 137–147)
SODIUM: 129 meq/L — AB (ref 137–147)

## 2013-08-31 LAB — PHOSPHORUS
PHOSPHORUS: 3.4 mg/dL (ref 2.3–4.6)
PHOSPHORUS: 2.4 mg/dL (ref 2.3–4.6)

## 2013-08-31 LAB — GLUCOSE, CAPILLARY
GLUCOSE-CAPILLARY: 130 mg/dL — AB (ref 70–99)
Glucose-Capillary: 105 mg/dL — ABNORMAL HIGH (ref 70–99)
Glucose-Capillary: 136 mg/dL — ABNORMAL HIGH (ref 70–99)
Glucose-Capillary: 141 mg/dL — ABNORMAL HIGH (ref 70–99)
Glucose-Capillary: 132 mg/dL — ABNORMAL HIGH (ref 70–99)
Glucose-Capillary: 133 mg/dL — ABNORMAL HIGH (ref 70–99)
Glucose-Capillary: 109 mg/dL — ABNORMAL HIGH (ref 70–99)

## 2013-08-31 LAB — CALCIUM, IONIZED
Calcium, Ion: 0.91 mmol/L — ABNORMAL LOW (ref 1.12–1.23)
Calcium, Ion: 0.94 mmol/L — ABNORMAL LOW (ref 1.12–1.23)

## 2013-08-31 LAB — MAGNESIUM
MAGNESIUM: 1.7 mg/dL (ref 1.5–2.5)
Magnesium: 1.6 mg/dL (ref 1.5–2.5)

## 2013-08-31 LAB — PROCALCITONIN: PROCALCITONIN: 138.17 ng/mL

## 2013-08-31 MED ORDER — POTASSIUM CHLORIDE 10 MEQ/50ML IV SOLN
10.0000 meq | INTRAVENOUS | Status: AC
  Administered 2013-08-31 (×2): 10 meq via INTRAVENOUS
  Filled 2013-08-31: qty 50

## 2013-08-31 MED ORDER — TRACE MINERALS CR-CU-F-FE-I-MN-MO-SE-ZN IV SOLN
INTRAVENOUS | Status: AC
  Administered 2013-08-31: 18:00:00 via INTRAVENOUS
  Filled 2013-08-31: qty 2000

## 2013-08-31 MED ORDER — FAT EMULSION 20 % IV EMUL
240.0000 mL | INTRAVENOUS | Status: AC
  Administered 2013-08-31: 240 mL via INTRAVENOUS
  Filled 2013-08-31: qty 250

## 2013-08-31 NOTE — Progress Notes (Signed)
CRITICAL VALUE ALERT  Critical value received:  Ca 6.0  Date of notification:  1/11  Time of notification:  1430  Critical value read back:yes  Nurse who received alert:  Herbert DeanerPuja Jo-Ann Johanning  MD notified (1st page):  Craige CottaSood  Time MD responded:  1430 (MD on unit, no orders given)

## 2013-08-31 NOTE — Progress Notes (Signed)
Better, on only 2 pressors now.  CPM.  No new surgical issues.  Patient seems like abdominal examination is good.  Marta LamasJames O. Gae BonWyatt, III, MD, FACS [phone]--pager [phone]--office [facility]

## 2013-08-31 NOTE — Progress Notes (Signed)
SUBJECTIVE: Intubated and sedated; requiring pressor support  Patient admitted 1-8 with perforated stomach ulcer. Underwent exploratory laparotomy and repair of ulcer on day of admission.  He has had persistent peritonitis and shock; developed afib with RVR 08/29/13 and failed emergent cardioversion.  Now in sinus rhythm.  EF 45%.  Troponin elevated with sepsis.  CURRENT MEDICATIONS: . antiseptic oral rinse  15 mL Mouth Rinse Q4H  . chlorhexidine  15 mL Mouth/Throat BID  . heparin subcutaneous  5,000 Units Subcutaneous Q8H  . insulin aspart  1-3 Units Subcutaneous Q4H  . micafungin (MYCAMINE) IV  100 mg Intravenous Q24H  . pantoprazole (PROTONIX) IV  40 mg Intravenous Q12H  . piperacillin-tazobactam (ZOSYN)  IV  3.375 g Intravenous Q8H  . potassium chloride  10 mEq Intravenous Q1 Hr x 2  . vancomycin  750 mg Intravenous Q8H   . sodium chloride 125 mL/hr (08/31/13 0209)  . Marland Kitchen.TPN (CLINIMIX-E) Adult 40 mL/hr at 08/30/13 1747   And  . fat emulsion 240 mL (08/30/13 1747)  . Marland Kitchen.TPN (CLINIMIX-E) Adult     And  . fat emulsion    . fentaNYL infusion INTRAVENOUS 200 mcg/hr (08/31/13 0207)  . midazolam (VERSED) infusion 3 mg/hr (08/31/13 0207)  . norepinephrine (LEVOPHED) Adult infusion 34 mcg/min (08/31/13 1100)  . phenylephrine (NEO-SYNEPHRINE) Adult infusion 15 mcg/min (08/31/13 1100)  . vasopressin (PITRESSIN) infusion - *FOR SHOCK* 0.03 Units/min (08/31/13 0458)    OBJECTIVE: Physical Exam: Filed Vitals:   08/31/13 0900 08/31/13 1000 08/31/13 1100 08/31/13 1107  BP: 111/66 97/65 100/65   Pulse: 73 75 72   Temp:    98.3 F (36.8 C)  TempSrc:    Oral  Resp: 12 0 15   Height:      Weight:      SpO2: 100% 100% 100%     Intake/Output Summary (Last 24 hours) at 08/31/13 1155 Last data filed at 08/31/13 1100  Gross per 24 hour  Intake   7641 ml  Output   2065 ml  Net   5576 ml    Telemetry reveals sinus rhythm overnight GEN- intubated and sedated, ill appearing Head-  normocephalic, atraumatic, ETT in place Eyes-  Sclera clear, conjunctiva pink Oropharynx- clear Neck- supple,   Lungs- Clear to ausculation bilaterally Heart- regular rate and rhythm  GI- distended with dressings, quiet  Extremities- no clubbing, cyanosis, or edema  LABS: Basic Metabolic Panel:  Recent Labs  16/05/9600/10/15 0256  08/30/13 1147 08/31/13 0330  NA  --   < > 135* 133*  K  --   < > 3.9 3.6*  CL  --   < > 98 96  CO2  --   < > 24 23  GLUCOSE  --   < > 192* 147*  BUN  --   < > 35* 39*  CREATININE  --   < > 1.64* 1.73*  CALCIUM  --   < > 5.9* 6.3*  MG 2.0  --   --  1.6  PHOS 3.9  --   --  3.4  < > = values in this interval not displayed. Liver Function Tests:  Recent Labs  08/30/13 1147 08/31/13 0330  AST 86* 97*  ALT 45 61*  ALKPHOS 38* 43  BILITOT 0.3 0.5  PROT 4.1* 4.2*  ALBUMIN 1.9* 1.8*    Recent Labs  08/28/13 1625 08/29/13 0930  LIPASE 30 14   CBC:  Recent Labs  08/28/13 1625 08/28/13 2359  08/30/13 1828 08/31/13 0330  WBC 6.2 2.1*  < > 9.7 6.7  NEUTROABS 4.4 1.3*  --   --   --   HGB 16.0 16.8  < > 13.3 12.4*  HCT 46.1 48.7  < > 39.0 35.3*  MCV 90.2 91.4  < > 90.9 90.1  PLT 190 176  < > 160 136*  < > = values in this interval not displayed. Cardiac Enzymes:  Recent Labs  08/29/13 1933 08/30/13 0256 08/30/13 0638  TROPONINI <0.30 <0.30 1.22*   Fasting Lipid Panel:  Recent Labs  08/30/13 0256  TRIG 161*    ASSESSMENT AND PLAN:  Principal Problem:   Perforated stomach Active Problems:   Septic shock(785.52)   Severe sepsis(995.92)   ARDS (adult respiratory distress syndrome)   Hypocalcemia   Hypophosphatemia   Protein-calorie malnutrition, moderate   Atrial fibrillation  1. afib- due to sepsis/ acute medical illness Now back in sinus Amiodarone is discontinued by PCCM If he has more afib then amiodarone can be restarted Not a candidate for anticoagulation Afib is driven by medical illness and should resolve as he  improves  2. Mildly depressed EF/ Elevated troponin- due to acute medical illness/ sepsis Consider outpatient elective stress testing once other medical issues are completely resolved  Other issues per surgery and PCCM  Cardiology to see as needed going forward Please call with questions

## 2013-08-31 NOTE — Progress Notes (Signed)
Name: James LeydenChristopher D Mccormick MRN: [id number] DOB: 12/01/1965    ADMISSION DATE:  08/28/2013 CONSULTATION DATE:  08/28/13  REFERRING MD :  Andrey CampanileWilson PRIMARY SERVICE: [facility]  CHIEF COMPLAINT:  Abdominal Pain  BRIEF PATIENT DESCRIPTION:  48 yo male smoker presented with abdominal pain from perforated gastric ulcer in setting of NSAID.  PCCM consulted to assist with septic shock and respiratory failure management.  SIGNIFICANT EVENTS: 1/8 Exploratory laparotomy, debridement of necrotic stomach with scissors, primary repair of perforated stomach in 2 layers.  Septic shock, VDRF, a fib post-op. 1/9 Cardiology consulted  1/11: still pressor dependent, NSR for > 24 hrs. Amiodarone stopped.   STUDIES:  1/8 CT abd/pelvis >> pneumoperitoneum, Lt kidney stone, diverticulosis 1/9 Echo >> mild LVH, EF 40 to 45%, grade 1 diastolic dysfx  LINES / TUBES: 1/8 ETT >>  1/8 Rt IJ CVL >> 1/8 Lt radial aline >>   CULTURES: Blood 1/9 >>  ANTIBIOTICS: Vancomycin 1/8 >>>> Zosyn 1/8 >>>> Micafungin 1/9 >>>>  SUBJECTIVE:  Remains on multiple pressors.   VITAL SIGNS: Temp:  [98.9 F (37.2 C)-102.8 F (39.3 C)] 98.9 F (37.2 C) (01/11 0710) Pulse Rate:  [69-114] 73 (01/11 0900) Resp:  [0-24] 12 (01/11 0900) BP: (86-128)/(38-71) 111/66 mmHg (01/11 0900) SpO2:  [95 %-100 %] 100 % (01/11 0900) Arterial Line BP: (97-138)/(52-72) 118/65 mmHg (01/11 0900) FiO2 (%):  [60 %-70 %] 60 % (01/11 0826) HEMODYNAMICS: CVP:  [12 mmHg-16 mmHg] 16 mmHg VENTILATOR SETTINGS: Vent Mode:  [-] PRVC FiO2 (%):  [60 %-70 %] 60 % Set Rate:  [24 bmp] 24 bmp Vt Set:  [530 mL] 530 mL PEEP:  [8 cmH20-10 cmH20] 8 cmH20 Plateau Pressure:  [22 cmH20-28 cmH20] 25 cmH20 INTAKE / OUTPUT: Intake/Output     01/10 0701 - 01/11 0700 01/11 0701 - 01/12 0700   I.V. (mL/kg) 5803.1 (56.9) 402.7 (3.9)   NG/GT 90    IV Piggyback 860 50   TPN 1150 100   Total Intake(mL/kg) 7903.1 (77.4) 552.7 (5.4)   Urine (mL/kg/hr) 1085 (0.4)    Emesis/NG output 1000 (0.4)    Drains 150 (0.1)    Total Output 2235     Net +5668.1 +552.7          PHYSICAL EXAMINATION: General: no distress, heavily sedated  Neuro:  RASS -3 HEENT: ETT in place Cardiovascular: irregular, tachycardic Lungs: b/l rhonchi, decreased in bases bilaterally  Abdomen: wound dressing clean, drains w/ ser/sang drainage, abd distended, hypoactive   Musculoskeletal: 1+ edema Skin: lower extremities cool  LABS:  CBC  Recent Labs Lab 08/30/13 0256 08/30/13 1828 08/31/13 0330  WBC 6.5 9.7 6.7  HGB 14.7 13.3 12.4*  HCT 42.9 39.0 35.3*  PLT 196 160 136*   BMET  Recent Labs Lab 08/30/13 0638 08/30/13 1147 08/31/13 0330  NA 136* 135* 133*  K 4.0 3.9 3.6*  CL 99 98 96  CO2 24 24 23   BUN 34* 35* 39*  CREATININE 1.62* 1.64* 1.73*  GLUCOSE 265* 192* 147*   Electrolytes  Recent Labs Lab 08/29/13 0420  08/30/13 0256 08/30/13 0638 08/30/13 1147 08/31/13 0330  CALCIUM 5.6*  < >  --  6.1* 5.9* 6.3*  MG 1.6  --  2.0  --   --  1.6  PHOS 1.9*  --  3.9  --   --  3.4  < > = values in this interval not displayed. Liver Enzymes  Recent Labs Lab 08/30/13 0638 08/30/13 1147 08/31/13 0330  AST 79* 86*  97*  ALT 41 45 61*  ALKPHOS 34* 38* 43  BILITOT 0.3 0.3 0.5  ALBUMIN 1.8* 1.9* 1.8*    Imaging Dg Chest Port 1 View  08/31/2013   CLINICAL DATA:  Respiratory failure.  EXAM: PORTABLE CHEST - 1 VIEW  COMPARISON:  08/30/2013  FINDINGS: Endotracheal tube remains with the tip approximately 3.5 cm above the carina. Lungs show lower volumes compared to the prior study with increasing atelectasis in the left perihilar lung and right lung base. Stable appearance of central line and nasogastric tube. No gross edema or pleural effusions.  IMPRESSION: Lower lung volumes bilaterally with increased bilateral atelectasis.   Electronically Signed   By: Irish Lack M.D.   On: 08/31/2013 08:42   Dg Chest Port 1 View  08/30/2013   CLINICAL DATA:  Intubated,  respiratory support  EXAM: PORTABLE CHEST - 1 VIEW  COMPARISON:  08/29/2013  FINDINGS: Endotracheal tube 5.7 cm above the carina. Low lung volumes persist with increased right mid lung atelectasis along the minor fissure. No enlarging effusion or pneumothorax. Stable heart size and vascularity. NG tube within the stomach. Right IJ central line tip distal SVC.  IMPRESSION: Increased right midlung atelectasis.  Stable low lung volumes.   Electronically Signed   By: Ruel Favors M.D.   On: 08/30/2013 07:18  increased bibasilar atx/effusions    ASSESSMENT / PLAN:  PULMONARY A:  Acute respiratory failure in setting of peritonitis. Bibasilar atx, probable sympathetic effusions 1/11 Hx of smoking >> air trapping on vent 1/10. P:   -cont full vent support  -cont to titrate PEEP/FIO2 -may need to consider [place] chest, further eval effusions -not ready for weaning   CARDIOVASCULAR A:  Septic shock 2nd to peritonitis. A fib with RVR, resolved, SR for > 24hrs  Acute systolic heart failure likely 2nd to sepsis. NSTEMI from demand ischemia. Hx of HTN with chronic diastolic dysfx. P:  -goal CVP 8 to 12 -wean pressors to keep SBP > 90, MAP > 65 -will stop amio as remains NSR and is on high FIO2  RENAL A:   Acute kidney injury 2nd to hypovolemia, and ATN in setting of shock. He has had some marginal bump in scr but hoping that this reflects plateau  Metabolic acidosis >> improved. P:   -monitor renal fx, urine outpt -f/u and replace electrolytes as needed -optimize hemodynamics >> no indication for renal replacement at this time -f/u chemistry   GASTROINTESTINAL A:   Perforated gastric ulcer in setting of NSAID use as outpt. Protein calorie malnutrition. P:   -post -op care per CCS -protonix BID -continue TNA per [facility]CS  HEMATOLOGIC A:   No acute issues. P:  -f/u CBC -SQ heparin for DVT prevention  INFECTIOUS A:  Septic shock 2nd to peritonitis. P:   -continue vancomycin, zosyn,  micafungin -drain management per surg -eventually will need re-imaging  -f/u procalcitonin  ENDOCRINE A:   Hyperglycemia. P:   -SSI   NEUROLOGIC A:   Acute encephalopathy 2nd to septic shock. P:   -goal RASS -2

## 2013-08-31 NOTE — Consult Note (Signed)
Name: James LeydenChristopher D Modisette MRN: [id number] DOB: 09/23/1965    ADMISSION DATE:  08/28/2013 CONSULTATION DATE:  08/28/13  REFERRING MD :  Andrey CampanileWilson PRIMARY SERVICE: [facility]  CHIEF COMPLAINT:  Abdominal Pain  BRIEF PATIENT DESCRIPTION:  48 yo male smoker presented with abdominal pain from perforated gastric ulcer in setting of NSAID.  PCCM consulted to assist with septic shock and respiratory failure management.  SIGNIFICANT EVENTS: 1/8 Exploratory laparotomy, debridement of necrotic stomach with scissors, primary repair of perforated stomach in 2 layers.  Septic shock, VDRF, a fib post-op. 1/9 Cardiology consulted  1/11 amiodarone d/c, remains on pressors  STUDIES:  1/8 CT abd/pelvis >> pneumoperitoneum, Lt kidney stone, diverticulosis 1/9 Echo >> mild LVH, EF 40 to 45%, grade 1 diastolic dysfx  LINES / TUBES: 1/8 ETT >>  1/8 Rt IJ CVL >> 1/8 Lt radial aline >>   CULTURES: Blood 1/9 >>  ANTIBIOTICS: Vancomycin 1/8 >>>> Zosyn 1/8 >>>> Micafungin 1/9 >>>>  SUBJECTIVE:  Remains on multiple pressors.  Making urine.  VITAL SIGNS: Temp:  [98.9 F (37.2 C)-102.8 F (39.3 C)] 98.9 F (37.2 C) (01/11 0710) Pulse Rate:  [69-114] 73 (01/11 0900) Resp:  [0-24] 12 (01/11 0900) BP: (86-128)/(38-71) 111/66 mmHg (01/11 0900) SpO2:  [95 %-100 %] 100 % (01/11 0900) Arterial Line BP: (97-138)/(52-72) 118/65 mmHg (01/11 0900) FiO2 (%):  [60 %-70 %] 60 % (01/11 0826) HEMODYNAMICS: CVP:  [12 mmHg-16 mmHg] 16 mmHg VENTILATOR SETTINGS: Vent Mode:  [-] PRVC FiO2 (%):  [60 %-70 %] 60 % Set Rate:  [24 bmp] 24 bmp Vt Set:  [530 mL] 530 mL PEEP:  [8 cmH20-10 cmH20] 8 cmH20 Plateau Pressure:  [22 cmH20-28 cmH20] 25 cmH20 INTAKE / OUTPUT: Intake/Output     01/10 0701 - 01/11 0700 01/11 0701 - 01/12 0700   I.V. (mL/kg) 5803.1 (56.9) 420.7 (4.1)   NG/GT 90    IV Piggyback 860 50   TPN 1150 100   Total Intake(mL/kg) 7903.1 (77.4) 570.7 (5.6)   Urine (mL/kg/hr) 1130 (0.5) 75 (0.3)   Emesis/NG  output 1000 (0.4)    Drains 150 (0.1)    Total Output 2280 75   Net +5623.1 +495.7          PHYSICAL EXAMINATION: General: no distress Neuro:  RASS -3 HEENT: ETT in place Cardiovascular: regular Lungs: b/l rhonchi Abdomen: wound dressing clean Musculoskeletal: 1+ edema Skin: lower extremities cool  LABS:  CBC  Recent Labs Lab 08/30/13 0256 08/30/13 1828 08/31/13 0330  WBC 6.5 9.7 6.7  HGB 14.7 13.3 12.4*  HCT 42.9 39.0 35.3*  PLT 196 160 136*   BMET  Recent Labs Lab 08/30/13 0638 08/30/13 1147 08/31/13 0330  NA 136* 135* 133*  K 4.0 3.9 3.6*  CL 99 98 96  CO2 24 24 23   BUN 34* 35* 39*  CREATININE 1.62* 1.64* 1.73*  GLUCOSE 265* 192* 147*   Electrolytes  Recent Labs Lab 08/29/13 0420  08/30/13 0256 08/30/13 0638 08/30/13 1147 08/31/13 0330  CALCIUM 5.6*  < >  --  6.1* 5.9* 6.3*  MG 1.6  --  2.0  --   --  1.6  PHOS 1.9*  --  3.9  --   --  3.4  < > = values in this interval not displayed. Liver Enzymes  Recent Labs Lab 08/30/13 0638 08/30/13 1147 08/31/13 0330  AST 79* 86* 97*  ALT 41 45 61*  ALKPHOS 34* 38* 43  BILITOT 0.3 0.3 0.5  ALBUMIN 1.8* 1.9* 1.8*  Imaging Dg Chest Port 1 View  08/31/2013   CLINICAL DATA:  Respiratory failure.  EXAM: PORTABLE CHEST - 1 VIEW  COMPARISON:  08/30/2013  FINDINGS: Endotracheal tube remains with the tip approximately 3.5 cm above the carina. Lungs show lower volumes compared to the prior study with increasing atelectasis in the left perihilar lung and right lung base. Stable appearance of central line and nasogastric tube. No gross edema or pleural effusions.  IMPRESSION: Lower lung volumes bilaterally with increased bilateral atelectasis.   Electronically Signed   By: Irish Lack M.D.   On: 08/31/2013 08:42   Dg Chest Port 1 View  08/30/2013   CLINICAL DATA:  Intubated, respiratory support  EXAM: PORTABLE CHEST - 1 VIEW  COMPARISON:  08/29/2013  FINDINGS: Endotracheal tube 5.7 cm above the carina.  Low lung volumes persist with increased right mid lung atelectasis along the minor fissure. No enlarging effusion or pneumothorax. Stable heart size and vascularity. NG tube within the stomach. Right IJ central line tip distal SVC.  IMPRESSION: Increased right midlung atelectasis.  Stable low lung volumes.   Electronically Signed   By: Ruel Favors M.D.   On: 08/30/2013 07:18     ASSESSMENT / PLAN:  PULMONARY A:  Acute respiratory failure in setting of peritonitis. Hx of smoking >> air trapping on vent 1/10. P:   -full vent support -f/u CXR, ABG -adjust PEEP, FiO2 to keep SpO2 > 92%, PaO2 > 60 mmH -prn BDs  CARDIOVASCULAR A:  Septic shock 2nd to peritonitis. A fib with RVR. Acute systolic heart failure likely 2nd to sepsis. NSTEMI from demand ischemia. Hx of HTN with chronic diastolic dysfx. P:  -goal CVP 8 to 12 -wean pressors to keep SBP > 90, MAP > 65 -d/c amiodarone 1/11  RENAL A:   Acute kidney injury 2nd to hypovolemia, and ATN in setting of shock. Metabolic acidosis >> improved. P:   -monitor renal fx, urine outpt -f/u and replace electrolytes as needed -optimize hemodynamics >> no indication for renal replacement at this time  GASTROINTESTINAL A:   Perforated gastric ulcer in setting of NSAID use as outpt. Protein calorie malnutrition. P:   -post -op care per CCS -protonix BID -continue TNA per [facility]  HEMATOLOGIC A:   No acute issues. P:  -f/u CBC -SQ heparin for DVT prevention  INFECTIOUS A:  Septic shock 2nd to peritonitis. P:   -continue vancomycin, zosyn, micafungin -f/u procalcitonin  ENDOCRINE A:   Hyperglycemia. ?relative adrenal insufficiency >> cortisol 39.5 from 1/9. P:   -SSI  NEUROLOGIC A:   Acute encephalopathy 2nd to septic shock. P:   -goal RASS -2  Updated family at bedside.  CC time 40 minutes.  Coralyn Helling, MD [facility] Pulmonary/Critical Care 08/31/2013, 9:53 AM Pager:  [phone] After 3pm call: [phone]

## 2013-08-31 NOTE — Progress Notes (Signed)
PARENTERAL NUTRITION CONSULT NOTE - FOLLOW UP  Pharmacy Consult for TPN Indication: Prolonged ileus  No Known Allergies  Patient Measurements: Height: 5\' 11"  (180.3 cm) Weight: 225 lb (102.059 kg) IBW/kg (Calculated) : 75.3 Adjusted Body Weight:  Usual Weight:   Vital Signs: Temp: 98.3 F (36.8 C) (01/11 1107) Temp src: Oral (01/11 1107) BP: 100/65 mmHg (01/11 1100) Pulse Rate: 72 (01/11 1100) Intake/Output from previous day: 01/10 0701 - 01/11 0700 In: 7903.1 [I.V.:5803.1; NG/GT:90; IV Piggyback:860; TPN:1150] Out: 2280 [Urine:1130; Emesis/NG output:1000; Drains:150] Intake/Output from this shift: Total I/O In: 1165.4 [I.V.:815.4; IV Piggyback:150; TPN:200] Out: 95 [Urine:95]  Labs:  Recent Labs  08/30/13 0256 08/30/13 1828 08/31/13 0330  WBC 6.5 9.7 6.7  HGB 14.7 13.3 12.4*  HCT 42.9 39.0 35.3*  PLT 196 160 136*     Recent Labs  08/29/13 0420  08/30/13 0256 08/30/13 0638 08/30/13 1147 08/31/13 0330  NA 146  < >  --  136* 135* 133*  K 3.7  < >  --  4.0 3.9 3.6*  CL 112  < >  --  99 98 96  CO2 18*  < >  --  24 24 23   GLUCOSE 239*  < >  --  265* 192* 147*  BUN 24*  < >  --  34* 35* 39*  CREATININE 1.07  < >  --  1.62* 1.64* 1.73*  CALCIUM 5.6*  < >  --  6.1* 5.9* 6.3*  MG 1.6  --  2.0  --   --  1.6  PHOS 1.9*  --  3.9  --   --  3.4  PROT 4.0*  --   --  3.9* 4.1* 4.2*  ALBUMIN 2.4*  --   --  1.8* 1.9* 1.8*  AST 56*  --   --  79* 86* 97*  ALT 41  --   --  41 45 61*  ALKPHOS 27*  --   --  34* 38* 43  BILITOT 0.4  --   --  0.3 0.3 0.5  PREALBUMIN  --   --  8.9*  --   --   --   TRIG  --   --  161*  --   --   --   < > = values in this interval not displayed. Estimated Creatinine Clearance: 62.8 ml/min (by C-G formula based on Cr of 1.73).    Recent Labs  08/30/13 1936 08/30/13 2322 08/31/13 0709  GLUCAP 96 105* 141*    Medications:  Scheduled:  . antiseptic oral rinse  15 mL Mouth Rinse Q4H  . chlorhexidine  15 mL Mouth/Throat BID  .  heparin subcutaneous  5,000 Units Subcutaneous Q8H  . insulin aspart  1-3 Units Subcutaneous Q4H  . micafungin (MYCAMINE) IV  100 mg Intravenous Q24H  . pantoprazole (PROTONIX) IV  40 mg Intravenous Q12H  . piperacillin-tazobactam (ZOSYN)  IV  3.375 g Intravenous Q8H  . potassium chloride  10 mEq Intravenous Q1 Hr x 2  . vancomycin  750 mg Intravenous Q8H    Insulin Requirements in the past 24 hours:  1 units Novolog ICU-SSI and 15 units/L in TPN  Current Nutrition:  NPO   Assessment:  48 y.o. male presented to ED 1/8 with abd pain. CT consistent with perforated stomach ulcer due to NSAID use; pneumoperitoneum; peritonitis. Taken emergently to OR where he underwent exp lap, debridement of necrotic stomach, repair of perforated stomach. Pt must be strict NPO until contrast study can be  done to eval gastric repair which will be several days from now. TPN to be started in the interim.   GI: NPO.  Hypoactive BS, abd distended.  Endo: No h/o DM. CBGs 96, 105, 141   Lytes: Na 133, K 3.6, Mg 1.6, Phos 3.4, Ca-corr 8.06  Renal: Cr 1.73- increasing. UOP 0.605ml/kg/hr yest with just 40mL so far today. Now on NS 13725ml/hr.  Discussed IVF with Dr. Craige CottaSood on 1/10 & wishes to continue IVF at current rate. Mild anasarca   Pulm: VDRF since surgery. FiO2 70%   Cards: Pt on pressors (phenylephrine at 15 mcg/min, NE at 34 mcg/min, vaso 0.03 units/min).  Inc Trop- NSTEMI vs demand ischemia. HR 125-144. H/o HTN.   Hepatobil: AST up some, otherwise LFTs wnl   Neuro: Sedated on fentanyl and versed. RASS goal -2.   ID: Vanc/Zosyn/Micafungin D#3 for perf stomach with necrosis. Serosanguinous fluid from drains.  WBC wnl, Tm 102.8.   Best Practices: Enox, PPI IV, MC  TPN Access: CVC 08/29/13  TPN day#: 2   Nutritional Goals:  Goal 2300-2400kcal and 150-160gm protein per RD recommendations   Plan:  1. Inc Clinimix E 5/15 to 60 ml/hr with lipids at 3010ml/hr. Cautious advance with fluid status. 2. Continue  ICU-SSI q4h  3. Increase Insulin to 15units/L  4. TPN labs in AM  Marisue HumbleKendra Nakul Avino, PharmD Clinical Pharmacist [facility]

## 2013-08-31 NOTE — Progress Notes (Signed)
Patient ID: James Mccormick, male   DOB: [birth year], 48 y.o.   MRN: [id number]   Subjective: Remains on the vent, sedated.  Remains on 3 vasopressors  Amiodarone discontinued per cards. UOP is adequate.  Objective:  Vital signs:  Filed Vitals:   08/31/13 0710 08/31/13 0715 08/31/13 0800 08/31/13 0900  BP:   118/63 111/66  Pulse:  74 73 73  Temp: 98.9 F (37.2 C)     TempSrc: Oral     Resp:  '24 24 12  ' Height:      Weight:      SpO2:  100% 100% 100%       Intake/Output   Yesterday:  01/10 0701 - 01/11 0700 In: 7903.1 [I.V.:5803.1; NG/GT:90; IV Piggyback:860; TPN:1150] Out: 2280 [Urine:1130; Emesis/NG output:1000; Drains:150] This shift:  Total I/O In: 570.7 [I.V.:420.7; IV Piggyback:50; TPN:100] Out: 75 [Urine:75]   Physical Exam: General: on vent sedated. Lungs: coarse Abdomen: hypoactive bowel sounds, distended.  Midline wound is clean, no necrosis.  Drains with serosanguineous output.   Ext:  SCDs BLE.  No mjr edema.  No cyanosis Skin: No petechiae / purpura   Problem List:   Principal Problem:   Perforated stomach Active Problems:   Septic shock(785.52)   Severe sepsis(995.92)   ARDS (adult respiratory distress syndrome)   Hypocalcemia   Hypophosphatemia   Protein-calorie malnutrition, moderate   Atrial fibrillation    Results:   Labs: Results for orders placed during the hospital encounter of 08/28/13 (from the past 48 hour(s))  GLUCOSE, CAPILLARY     Status: Abnormal   Collection Time    08/29/13 11:13 AM      Result Value Range   Glucose-Capillary 114 (*) 70 - 99 mg/dL  POCT I-STAT 3, BLOOD GAS (G3+)     Status: Abnormal   Collection Time    08/29/13 11:17 AM      Result Value Range   pH, Arterial 7.191 (*) 7.350 - 7.450   pCO2 arterial 44.8  35.0 - 45.0 mmHg   pO2, Arterial 94.0  80.0 - 100.0 mmHg   Bicarbonate 17.0 (*) 20.0 - 24.0 mEq/L   TCO2 18  0 - 100 mmol/L   O2 Saturation 95.0     Acid-base deficit 11.0 (*) 0.0 - 2.0 mmol/L    Patient temperature 100.1 F     Collection site ARTERIAL LINE     Drawn by RT     Sample type ARTERIAL    GLUCOSE, CAPILLARY     Status: Abnormal   Collection Time    08/29/13  3:26 PM      Result Value Range   Glucose-Capillary 106 (*) 70 - 99 mg/dL  POCT I-STAT 3, BLOOD GAS (G3+)     Status: Abnormal   Collection Time    08/29/13  3:57 PM      Result Value Range   pH, Arterial 7.303 (*) 7.350 - 7.450   pCO2 arterial 49.5 (*) 35.0 - 45.0 mmHg   pO2, Arterial 71.0 (*) 80.0 - 100.0 mmHg   Bicarbonate 24.3 (*) 20.0 - 24.0 mEq/L   TCO2 26  0 - 100 mmol/L   O2 Saturation 91.0     Acid-base deficit 2.0  0.0 - 2.0 mmol/L   Patient temperature 99.8 F     Sample type ARTERIAL    CBC     Status: None   Collection Time    08/29/13  4:00 PM      Result Value Range   WBC  4.7  4.0 - 10.5 K/uL   RBC 4.85  4.22 - 5.81 MIL/uL   Hemoglobin 14.9  13.0 - 17.0 g/dL   HCT 43.5  39.0 - 52.0 %   MCV 89.7  78.0 - 100.0 fL   MCH 30.7  26.0 - 34.0 pg   MCHC 34.3  30.0 - 36.0 g/dL   RDW 13.6  11.5 - 15.5 %   Platelets 197  150 - 400 K/uL  BASIC METABOLIC PANEL     Status: Abnormal   Collection Time    08/29/13  4:00 PM      Result Value Range   Sodium 146  137 - 147 mEq/L   Potassium 3.9  3.7 - 5.3 mEq/L   Chloride 112  96 - 112 mEq/L   CO2 25  19 - 32 mEq/L   Glucose, Bld 124 (*) 70 - 99 mg/dL   BUN 29 (*) 6 - 23 mg/dL   Creatinine, Ser 1.42 (*) 0.50 - 1.35 mg/dL   Calcium 9.5  8.4 - 10.5 mg/dL   GFR calc non Af Amer 57 (*) >90 mL/min   GFR calc Af Amer 66 (*) >90 mL/min   Comment: (NOTE)     The eGFR has been calculated using the CKD EPI equation.     This calculation has not been validated in all clinical situations.     eGFR's persistently <90 mL/min signify possible Chronic Kidney     Disease.  POCT I-STAT 3, BLOOD GAS (G3+)     Status: Abnormal   Collection Time    08/29/13  5:37 PM      Result Value Range   pH, Arterial 7.283 (*) 7.350 - 7.450   pCO2 arterial 42.4  35.0 -  45.0 mmHg   pO2, Arterial 66.0 (*) 80.0 - 100.0 mmHg   Bicarbonate 19.9 (*) 20.0 - 24.0 mEq/L   TCO2 21  0 - 100 mmol/L   O2 Saturation 89.0     Acid-base deficit 6.0 (*) 0.0 - 2.0 mmol/L   Patient temperature 99.8 F     Collection site ARTERIAL LINE     Sample type ARTERIAL    LACTIC ACID, PLASMA     Status: Abnormal   Collection Time    08/29/13  6:21 PM      Result Value Range   Lactic Acid, Venous 4.1 (*) 0.5 - 2.2 mmol/L  GLUCOSE, CAPILLARY     Status: Abnormal   Collection Time    08/29/13  7:29 PM      Result Value Range   Glucose-Capillary 138 (*) 70 - 99 mg/dL   Comment 1 Documented in Chart     Comment 2 Notify RN    TROPONIN I     Status: None   Collection Time    08/29/13  7:33 PM      Result Value Range   Troponin I <0.30  <0.30 ng/mL   Comment:            Due to the release kinetics of cTnI,     a negative result within the first hours     of the onset of symptoms does not rule out     myocardial infarction with certainty.     If myocardial infarction is still suspected,     repeat the test at appropriate intervals.  POCT I-STAT 3, BLOOD GAS (G3+)     Status: Abnormal   Collection Time    08/29/13  7:35 PM  Result Value Range   pH, Arterial 7.276 (*) 7.350 - 7.450   pCO2 arterial 42.8  35.0 - 45.0 mmHg   pO2, Arterial 69.0 (*) 80.0 - 100.0 mmHg   Bicarbonate 19.6 (*) 20.0 - 24.0 mEq/L   TCO2 21  0 - 100 mmol/L   O2 Saturation 90.0     Acid-base deficit 7.0 (*) 0.0 - 2.0 mmol/L   Patient temperature 100.7 F     Collection site ARTERIAL LINE     Sample type ARTERIAL    CORTISOL     Status: None   Collection Time    08/29/13  8:45 PM      Result Value Range   Cortisol, Plasma 28.0     Comment: (NOTE)     AM:  4.3 - 22.4 ug/dL     PM:  3.1 - 16.7 ug/dL     Performed at [facility]     Status: None   Collection Time    08/29/13 10:30 PM      Result Value Range   Total hemoglobin 14.1  13.5 - 18.0 g/dL   O2  Saturation 72.1     Carboxyhemoglobin 0.7  0.5 - 1.5 %   Methemoglobin 0.6  0.0 - 1.5 %  GLUCOSE, CAPILLARY     Status: Abnormal   Collection Time    08/29/13 11:46 PM      Result Value Range   Glucose-Capillary 194 (*) 70 - 99 mg/dL   Comment 1 Documented in Chart     Comment 2 Notify RN    POCT I-STAT 3, BLOOD GAS (G3+)     Status: Abnormal   Collection Time    08/29/13 11:51 PM      Result Value Range   pH, Arterial 7.342 (*) 7.350 - 7.450   pCO2 arterial 43.0  35.0 - 45.0 mmHg   pO2, Arterial 68.0 (*) 80.0 - 100.0 mmHg   Bicarbonate 23.1  20.0 - 24.0 mEq/L   TCO2 24  0 - 100 mmol/L   O2 Saturation 91.0     Acid-base deficit 2.0  0.0 - 2.0 mmol/L   Patient temperature 100.0 F     Collection site ARTERIAL LINE     Sample type ARTERIAL    CBC     Status: None   Collection Time    08/30/13  2:56 AM      Result Value Range   WBC 6.5  4.0 - 10.5 K/uL   RBC 4.77  4.22 - 5.81 MIL/uL   Hemoglobin 14.7  13.0 - 17.0 g/dL   HCT 42.9  39.0 - 52.0 %   MCV 89.9  78.0 - 100.0 fL   MCH 30.8  26.0 - 34.0 pg   MCHC 34.3  30.0 - 36.0 g/dL   RDW 14.1  11.5 - 15.5 %   Platelets 196  150 - 400 K/uL  PREALBUMIN     Status: Abnormal   Collection Time    08/30/13  2:56 AM      Result Value Range   Prealbumin 8.9 (*) 17.0 - 34.0 mg/dL   Comment: Performed at [facility]     Status: None   Collection Time    08/30/13  2:56 AM      Result Value Range   Magnesium 2.0  1.5 - 2.5 mg/dL  PHOSPHORUS     Status: None   Collection Time    08/30/13  2:56 AM  Result Value Range   Phosphorus 3.9  2.3 - 4.6 mg/dL  TROPONIN I     Status: None   Collection Time    08/30/13  2:56 AM      Result Value Range   Troponin I <0.30  <0.30 ng/mL   Comment:            Due to the release kinetics of cTnI,     a negative result within the first hours     of the onset of symptoms does not rule out     myocardial infarction with certainty.     If myocardial infarction is still  suspected,     repeat the test at appropriate intervals.  TRIGLYCERIDES     Status: Abnormal   Collection Time    08/30/13  2:56 AM      Result Value Range   Triglycerides 161 (*) <150 mg/dL  PROCALCITONIN     Status: None   Collection Time    08/30/13  2:58 AM      Result Value Range   Procalcitonin 180.37     Comment:            Interpretation:     PCT >= 10 ng/mL:     Important systemic inflammatory response,     almost exclusively due to severe bacterial     sepsis or septic shock.     (NOTE)             ICU PCT Algorithm               Non ICU PCT Algorithm        ----------------------------     ------------------------------             PCT < 0.25 ng/mL                 PCT < 0.1 ng/mL         Stopping of antibiotics            Stopping of antibiotics           strongly encouraged.               strongly encouraged.        ----------------------------     ------------------------------           PCT level decrease by               PCT < 0.25 ng/mL           >= 80% from peak PCT           OR PCT 0.25 - 0.5 ng/mL          Stopping of antibiotics                                                 encouraged.         Stopping of antibiotics               encouraged.        ----------------------------     ------------------------------           PCT level decrease by              PCT >= 0.25 ng/mL           < 80% from peak PCT  AND PCT >= 0.5 ng/mL            Continuing antibiotics                                                  encouraged.           Continuing antibiotics                encouraged.        ----------------------------     ------------------------------         PCT level increase compared          PCT > 0.5 ng/mL             with peak PCT AND              PCT >= 0.5 ng/mL             Escalation of antibiotics                                              strongly encouraged.          Escalation of antibiotics            strongly encouraged.   GLUCOSE, CAPILLARY     Status: Abnormal   Collection Time    08/30/13  3:31 AM      Result Value Range   Glucose-Capillary 216 (*) 70 - 99 mg/dL   Comment 1 Documented in Chart     Comment 2 Notify RN    POCT I-STAT 3, BLOOD GAS (G3+)     Status: Abnormal   Collection Time    08/30/13  3:36 AM      Result Value Range   pH, Arterial 7.352  7.350 - 7.450   pCO2 arterial 42.6  35.0 - 45.0 mmHg   pO2, Arterial 60.0 (*) 80.0 - 100.0 mmHg   Bicarbonate 23.6  20.0 - 24.0 mEq/L   TCO2 25  0 - 100 mmol/L   O2 Saturation 89.0     Acid-base deficit 2.0  0.0 - 2.0 mmol/L   Patient temperature 98.6 F     Collection site ARTERIAL LINE     Sample type ARTERIAL    TROPONIN I     Status: Abnormal   Collection Time    08/30/13  6:38 AM      Result Value Range   Troponin I 1.22 (*) <0.30 ng/mL   Comment:            Due to the release kinetics of cTnI,     a negative result within the first hours     of the onset of symptoms does not rule out     myocardial infarction with certainty.     If myocardial infarction is still suspected,     repeat the test at appropriate intervals.     CRITICAL RESULT CALLED TO, READ BACK BY AND VERIFIED WITH:     PATELCRN [id number] [place]  COMPREHENSIVE METABOLIC PANEL     Status: Abnormal   Collection Time    08/30/13  6:38 AM      Result Value Range   Sodium 136 (*) 137 - 147 mEq/L   Potassium  4.0  3.7 - 5.3 mEq/L   Chloride 99  96 - 112 mEq/L   CO2 24  19 - 32 mEq/L   Glucose, Bld 265 (*) 70 - 99 mg/dL   BUN 34 (*) 6 - 23 mg/dL   Creatinine, Ser 1.62 (*) 0.50 - 1.35 mg/dL   Calcium 6.1 (*) 8.4 - 10.5 mg/dL   Comment: CRITICAL RESULT CALLED TO, READ BACK BY AND VERIFIED WITH:     CURRINBRN [id number] [place]   Total Protein 3.9 (*) 6.0 - 8.3 g/dL   Albumin 1.8 (*) 3.5 - 5.2 g/dL   AST 79 (*) 0 - 37 U/L   ALT 41  0 - 53 U/L   Alkaline Phosphatase 34 (*) 39 - 117 U/L   Total Bilirubin 0.3  0.3 - 1.2 mg/dL   GFR calc non Af Amer 48 (*) >90  mL/min   GFR calc Af Amer 56 (*) >90 mL/min   Comment: (NOTE)     The eGFR has been calculated using the CKD EPI equation.     This calculation has not been validated in all clinical situations.     eGFR's persistently <90 mL/min signify possible Chronic Kidney     Disease.  GLUCOSE, CAPILLARY     Status: Abnormal   Collection Time    08/30/13  7:23 AM      Result Value Range   Glucose-Capillary 204 (*) 70 - 99 mg/dL   Comment 1 Notify RN    POCT I-STAT 3, BLOOD GAS (G3+)     Status: Abnormal   Collection Time    08/30/13  9:52 AM      Result Value Range   pH, Arterial 7.405  7.350 - 7.450   pCO2 arterial 42.6  35.0 - 45.0 mmHg   pO2, Arterial 73.0 (*) 80.0 - 100.0 mmHg   Bicarbonate 26.7 (*) 20.0 - 24.0 mEq/L   TCO2 28  0 - 100 mmol/L   O2 Saturation 95.0     Acid-Base Excess 2.0  0.0 - 2.0 mmol/L   Sample type ARTERIAL    GLUCOSE, CAPILLARY     Status: Abnormal   Collection Time    08/30/13 11:25 AM      Result Value Range   Glucose-Capillary 176 (*) 70 - 99 mg/dL   Comment 1 Notify RN    VANCOMYCIN, [place]     Status: Abnormal   Collection Time    08/30/13 11:47 AM      Result Value Range   Vancomycin Tr 22.6 (*) 10.0 - 20.0 ug/mL  COMPREHENSIVE METABOLIC PANEL     Status: Abnormal   Collection Time    08/30/13 11:47 AM      Result Value Range   Sodium 135 (*) 137 - 147 mEq/L   Potassium 3.9  3.7 - 5.3 mEq/L   Chloride 98  96 - 112 mEq/L   CO2 24  19 - 32 mEq/L   Glucose, Bld 192 (*) 70 - 99 mg/dL   BUN 35 (*) 6 - 23 mg/dL   Creatinine, Ser 1.64 (*) 0.50 - 1.35 mg/dL   Calcium 5.9 (*) 8.4 - 10.5 mg/dL   Comment: CRITICAL RESULT CALLED TO, READ BACK BY AND VERIFIED WITH:     PATEL P,RN 08/30/13 1228 BY JONESJ   Total Protein 4.1 (*) 6.0 - 8.3 g/dL   Albumin 1.9 (*) 3.5 - 5.2 g/dL   AST 86 (*) 0 - 37 U/L   Comment: HEMOLYSIS AT THIS LEVEL  MAY AFFECT RESULT   ALT 45  0 - 53 U/L   Alkaline Phosphatase 38 (*) 39 - 117 U/L   Total Bilirubin 0.3  0.3 - 1.2 mg/dL    GFR calc non Af Amer 48 (*) >90 mL/min   GFR calc Af Amer 55 (*) >90 mL/min   Comment: (NOTE)     The eGFR has been calculated using the CKD EPI equation.     This calculation has not been validated in all clinical situations.     eGFR's persistently <90 mL/min signify possible Chronic Kidney     Disease.  GLUCOSE, CAPILLARY     Status: Abnormal   Collection Time    08/30/13  3:21 PM      Result Value Range   Glucose-Capillary 156 (*) 70 - 99 mg/dL   Comment 1 Notify RN    CBC     Status: None   Collection Time    08/30/13  6:28 PM      Result Value Range   WBC 9.7  4.0 - 10.5 K/uL   RBC 4.29  4.22 - 5.81 MIL/uL   Hemoglobin 13.3  13.0 - 17.0 g/dL   HCT 39.0  39.0 - 52.0 %   MCV 90.9  78.0 - 100.0 fL   MCH 31.0  26.0 - 34.0 pg   MCHC 34.1  30.0 - 36.0 g/dL   RDW 14.4  11.5 - 15.5 %   Platelets 160  150 - 400 K/uL  POCT I-STAT 3, BLOOD GAS (G3+)     Status: Abnormal   Collection Time    08/30/13  6:34 PM      Result Value Range   pH, Arterial 7.302 (*) 7.350 - 7.450   pCO2 arterial 53.4 (*) 35.0 - 45.0 mmHg   pO2, Arterial 75.0 (*) 80.0 - 100.0 mmHg   Bicarbonate 25.8 (*) 20.0 - 24.0 mEq/L   TCO2 27  0 - 100 mmol/L   O2 Saturation 91.0     Acid-base deficit 1.0  0.0 - 2.0 mmol/L   Patient temperature 102.0 F     Sample type ARTERIAL    GLUCOSE, CAPILLARY     Status: None   Collection Time    08/30/13  7:36 PM      Result Value Range   Glucose-Capillary 96  70 - 99 mg/dL   Comment 1 Documented in Chart     Comment 2 Notify RN    GLUCOSE, CAPILLARY     Status: Abnormal   Collection Time    08/30/13 11:22 PM      Result Value Range   Glucose-Capillary 105 (*) 70 - 99 mg/dL  CBC     Status: Abnormal   Collection Time    08/31/13  3:30 AM      Result Value Range   WBC 6.7  4.0 - 10.5 K/uL   RBC 3.92 (*) 4.22 - 5.81 MIL/uL   Hemoglobin 12.4 (*) 13.0 - 17.0 g/dL   HCT 35.3 (*) 39.0 - 52.0 %   MCV 90.1  78.0 - 100.0 fL   MCH 31.6  26.0 - 34.0 pg   MCHC 35.1  30.0 -  36.0 g/dL   RDW 14.7  11.5 - 15.5 %   Platelets 136 (*) 150 - 400 K/uL  COMPREHENSIVE METABOLIC PANEL     Status: Abnormal   Collection Time    08/31/13  3:30 AM      Result Value Range   Sodium 133 (*)  137 - 147 mEq/L   Potassium 3.6 (*) 3.7 - 5.3 mEq/L   Chloride 96  96 - 112 mEq/L   CO2 23  19 - 32 mEq/L   Glucose, Bld 147 (*) 70 - 99 mg/dL   BUN 39 (*) 6 - 23 mg/dL   Creatinine, Ser 1.73 (*) 0.50 - 1.35 mg/dL   Calcium 6.3 (*) 8.4 - 10.5 mg/dL   Comment: CRITICAL RESULT CALLED TO, READ BACK BY AND VERIFIED WITH:      J. Bud RN [id number] [phone] GREEN R   Total Protein 4.2 (*) 6.0 - 8.3 g/dL   Albumin 1.8 (*) 3.5 - 5.2 g/dL   AST 97 (*) 0 - 37 U/L   ALT 61 (*) 0 - 53 U/L   Alkaline Phosphatase 43  39 - 117 U/L   Total Bilirubin 0.5  0.3 - 1.2 mg/dL   GFR calc non Af Amer 45 (*) >90 mL/min   GFR calc Af Amer 52 (*) >90 mL/min   Comment: (NOTE)     The eGFR has been calculated using the CKD EPI equation.     This calculation has not been validated in all clinical situations.     eGFR's persistently <90 mL/min signify possible Chronic Kidney     Disease.  MAGNESIUM     Status: None   Collection Time    08/31/13  3:30 AM      Result Value Range   Magnesium 1.6  1.5 - 2.5 mg/dL  PHOSPHORUS     Status: None   Collection Time    08/31/13  3:30 AM      Result Value Range   Phosphorus 3.4  2.3 - 4.6 mg/dL  PROCALCITONIN     Status: None   Collection Time    08/31/13  3:30 AM      Result Value Range   Procalcitonin 138.17     Comment:            Interpretation:     PCT >= 10 ng/mL:     Important systemic inflammatory response,     almost exclusively due to severe bacterial     sepsis or septic shock.     (NOTE)             ICU PCT Algorithm               Non ICU PCT Algorithm        ----------------------------     ------------------------------             PCT < 0.25 ng/mL                 PCT < 0.1 ng/mL         Stopping of antibiotics            Stopping of antibiotics            strongly encouraged.               strongly encouraged.        ----------------------------     ------------------------------           PCT level decrease by               PCT < 0.25 ng/mL           >= 80% from peak PCT           OR PCT 0.25 - 0.5 ng/mL  Stopping of antibiotics                                                 encouraged.         Stopping of antibiotics               encouraged.        ----------------------------     ------------------------------           PCT level decrease by              PCT >= 0.25 ng/mL           < 80% from peak PCT            AND PCT >= 0.5 ng/mL            Continuing antibiotics                                                  encouraged.           Continuing antibiotics                encouraged.        ----------------------------     ------------------------------         PCT level increase compared          PCT > 0.5 ng/mL             with peak PCT AND              PCT >= 0.5 ng/mL             Escalation of antibiotics                                              strongly encouraged.          Escalation of antibiotics            strongly encouraged.  GLUCOSE, CAPILLARY     Status: Abnormal   Collection Time    08/31/13  7:09 AM      Result Value Range   Glucose-Capillary 141 (*) 70 - 99 mg/dL   Comment 1 Notify RN      Imaging / Studies: Dg Chest Port 1 View  08/31/2013   CLINICAL DATA:  Respiratory failure.  EXAM: PORTABLE CHEST - 1 VIEW  COMPARISON:  08/30/2013  FINDINGS: Endotracheal tube remains with the tip approximately 3.5 cm above the carina. Lungs show lower volumes compared to the prior study with increasing atelectasis in the left perihilar lung and right lung base. Stable appearance of central line and nasogastric tube. No gross edema or pleural effusions.  IMPRESSION: Lower lung volumes bilaterally with increased bilateral atelectasis.   Electronically Signed   By: Aletta Edouard M.D.   On: 08/31/2013 08:42    Dg Chest Port 1 View  08/30/2013   CLINICAL DATA:  Intubated, respiratory support  EXAM: PORTABLE CHEST - 1 VIEW  COMPARISON:  08/29/2013  FINDINGS: Endotracheal tube 5.7 cm above the carina. Low lung volumes persist with increased right mid lung  atelectasis along the minor fissure. No enlarging effusion or pneumothorax. Stable heart size and vascularity. NG tube within the stomach. Right IJ central line tip distal SVC.  IMPRESSION: Increased right midlung atelectasis.  Stable low lung volumes.   Electronically Signed   By: Daryll Brod M.D.   On: 08/30/2013 07:18    Medications / Allergies: per chart  Antibiotics: Anti-infectives   Start     Dose/Rate Route Frequency Ordered Stop   08/30/13 2000  vancomycin (VANCOCIN) IVPB 750 mg/150 ml premix     750 mg 150 mL/hr over 60 Minutes Intravenous Every 8 hours 08/30/13 1356     08/29/13 2200  micafungin (MYCAMINE) 100 mg in sodium chloride 0.9 % 100 mL IVPB     100 mg 100 mL/hr over 1 Hours Intravenous Every 24 hours 08/29/13 2036     08/29/13 0400  vancomycin (VANCOCIN) IVPB 1000 mg/200 mL premix  Status:  Discontinued     1,000 mg 200 mL/hr over 60 Minutes Intravenous Every 8 hours 08/28/13 2358 08/30/13 1356   08/29/13 0400  piperacillin-tazobactam (ZOSYN) IVPB 3.375 g     3.375 g 12.5 mL/hr over 240 Minutes Intravenous 3 times per day 08/28/13 2358     08/28/13 1845  [MAR Hold]  vancomycin (VANCOCIN) IVPB 1000 mg/200 mL premix     (On MAR Hold since 08/28/13 1957)   1,000 mg 200 mL/hr over 60 Minutes Intravenous  Once 08/28/13 1831 08/28/13 1955   08/28/13 1845  [MAR Hold]  piperacillin-tazobactam (ZOSYN) IVPB 3.375 g     (On MAR Hold since 08/28/13 1957)   3.375 g 100 mL/hr over 30 Minutes Intravenous  Once 08/28/13 1831 08/28/13 2056      Assessment/Plan Acute respiratory failure Septic shock Afib with RVR NSTEMI Acute systolic heart failure AKI  PCM-TPN Perforated gastric ulcer/peritonitis Exploratory laparotomy,  debridement of necrotic stomach, primary repair of perforated stomach(Dr. Redmond Pulling 08/29/13) -remains critically ill.  Continue with antibiotics, TPN.  Further management per CCM, appreciate assistance.   Erby Pian, [facility] Surgery Pager [phone] Office [phone]  08/31/2013 10:27 AM

## 2013-09-01 ENCOUNTER — Encounter (HOSPITAL_COMMUNITY): Payer: Self-pay | Admitting: General Surgery

## 2013-09-01 ENCOUNTER — Inpatient Hospital Stay (HOSPITAL_COMMUNITY): Payer: Medicaid Other

## 2013-09-01 DIAGNOSIS — J9589 Other postprocedural complications and disorders of respiratory system, not elsewhere classified: Secondary | ICD-10-CM

## 2013-09-01 DIAGNOSIS — R9439 Abnormal result of other cardiovascular function study: Secondary | ICD-10-CM

## 2013-09-01 LAB — COMPREHENSIVE METABOLIC PANEL
ALT: 48 U/L (ref 0–53)
AST: 54 U/L — AB (ref 0–37)
Albumin: 1.7 g/dL — ABNORMAL LOW (ref 3.5–5.2)
Alkaline Phosphatase: 50 U/L (ref 39–117)
BILIRUBIN TOTAL: 0.7 mg/dL (ref 0.3–1.2)
BUN: 32 mg/dL — ABNORMAL HIGH (ref 6–23)
CHLORIDE: 101 meq/L (ref 96–112)
CO2: 24 meq/L (ref 19–32)
Calcium: 7 mg/dL — ABNORMAL LOW (ref 8.4–10.5)
Creatinine, Ser: 1.29 mg/dL (ref 0.50–1.35)
GFR, EST AFRICAN AMERICAN: 74 mL/min — AB (ref >90)
GFR, EST NON AFRICAN AMERICAN: 64 mL/min — AB (ref >90)
GLUCOSE: 139 mg/dL — AB (ref 70–99)
Potassium: 3.5 mEq/L — ABNORMAL LOW (ref 3.7–5.3)
Sodium: 136 mEq/L — ABNORMAL LOW (ref 137–147)
Total Protein: 4.7 g/dL — ABNORMAL LOW (ref 6.0–8.3)

## 2013-09-01 LAB — POCT I-STAT 3, ART BLOOD GAS (G3+)
Acid-base deficit: 3 mmol/L — ABNORMAL HIGH (ref 0.0–2.0)
Acid-base deficit: 2 mmol/L (ref 0.0–2.0)
BICARBONATE: 24.4 meq/L — AB (ref 20.0–24.0)
Bicarbonate: 23.1 mEq/L (ref 20.0–24.0)
O2 Saturation: 97 %
O2 Saturation: 93 %
PCO2 ART: 46.1 mmHg — AB (ref 35.0–45.0)
PH ART: 7.331 — AB (ref 7.350–7.450)
Patient temperature: 98.6
TCO2: 24 mmol/L (ref 0–100)
TCO2: 26 mmol/L (ref 0–100)
pCO2 arterial: 42.1 mmHg (ref 35.0–45.0)
pH, Arterial: 7.348 — ABNORMAL LOW (ref 7.350–7.450)
pO2, Arterial: 102 mmHg — ABNORMAL HIGH (ref 80.0–100.0)
pO2, Arterial: 73 mmHg — ABNORMAL LOW (ref 80.0–100.0)

## 2013-09-01 LAB — DIFFERENTIAL
Basophils Absolute: 0 10*3/uL (ref 0.0–0.1)
Basophils Relative: 0 % (ref 0–1)
EOS ABS: 0.2 10*3/uL (ref 0.0–0.7)
Eosinophils Relative: 5 % (ref 0–5)
Lymphocytes Relative: 9 % — ABNORMAL LOW (ref 12–46)
Lymphs Abs: 0.4 10*3/uL — ABNORMAL LOW (ref 0.7–4.0)
MONOS PCT: 8 % (ref 3–12)
Monocytes Absolute: 0.4 10*3/uL (ref 0.1–1.0)
NEUTROS ABS: 3.5 10*3/uL (ref 1.7–7.7)
Neutrophils Relative %: 78 % — ABNORMAL HIGH (ref 43–77)

## 2013-09-01 LAB — CBC
HEMATOCRIT: 35.1 % — AB (ref 39.0–52.0)
Hemoglobin: 12 g/dL — ABNORMAL LOW (ref 13.0–17.0)
MCH: 30.4 pg (ref 26.0–34.0)
MCHC: 34.2 g/dL (ref 30.0–36.0)
MCV: 88.9 fL (ref 78.0–100.0)
Platelets: 125 10*3/uL — ABNORMAL LOW (ref 150–400)
RBC: 3.95 MIL/uL — ABNORMAL LOW (ref 4.22–5.81)
RDW: 14.3 % (ref 11.5–15.5)
WBC: 4.5 10*3/uL (ref 4.0–10.5)

## 2013-09-01 LAB — GLUCOSE, CAPILLARY
GLUCOSE-CAPILLARY: 91 mg/dL (ref 70–99)
GLUCOSE-CAPILLARY: 83 mg/dL (ref 70–99)
Glucose-Capillary: 131 mg/dL — ABNORMAL HIGH (ref 70–99)

## 2013-09-01 LAB — TRIGLYCERIDES: Triglycerides: 250 mg/dL — ABNORMAL HIGH (ref <150)

## 2013-09-01 LAB — PREALBUMIN: Prealbumin: 4.7 mg/dL — ABNORMAL LOW (ref 17.0–34.0)

## 2013-09-01 LAB — MAGNESIUM: Magnesium: 2 mg/dL (ref 1.5–2.5)

## 2013-09-01 LAB — VANCOMYCIN, TROUGH: Vancomycin Tr: 22.8 ug/mL — ABNORMAL HIGH (ref 10.0–20.0)

## 2013-09-01 LAB — PHOSPHORUS: Phosphorus: 1.9 mg/dL — ABNORMAL LOW (ref 2.3–4.6)

## 2013-09-01 MED ORDER — POTASSIUM CHLORIDE 10 MEQ/50ML IV SOLN
10.0000 meq | INTRAVENOUS | Status: AC
  Administered 2013-09-01 (×3): 10 meq via INTRAVENOUS
  Filled 2013-09-01 (×3): qty 50

## 2013-09-01 MED ORDER — FAT EMULSION 20 % IV EMUL
240.0000 mL | INTRAVENOUS | Status: AC
  Administered 2013-09-01: 240 mL via INTRAVENOUS
  Filled 2013-09-01: qty 250

## 2013-09-01 MED ORDER — TRACE MINERALS CR-CU-F-FE-I-MN-MO-SE-ZN IV SOLN
INTRAVENOUS | Status: AC
  Administered 2013-09-01: 18:00:00 via INTRAVENOUS
  Filled 2013-09-01: qty 2000

## 2013-09-01 MED ORDER — VANCOMYCIN HCL IN DEXTROSE 1-5 GM/200ML-% IV SOLN
1000.0000 mg | Freq: Two times a day (BID) | INTRAVENOUS | Status: DC
  Administered 2013-09-02 – 2013-09-07 (×10): 1000 mg via INTRAVENOUS
  Filled 2013-09-01 (×11): qty 200

## 2013-09-01 MED ORDER — POTASSIUM CHLORIDE 10 MEQ/100ML IV SOLN: 10.0000 meq | INTRAVENOUS | Status: DC

## 2013-09-01 MED ORDER — POTASSIUM PHOSPHATE DIBASIC 3 MMOLE/ML IV SOLN
30.0000 mmol | Freq: Once | INTRAVENOUS | Status: AC
  Administered 2013-09-01: 30 mmol via INTRAVENOUS
  Filled 2013-09-01: qty 10

## 2013-09-01 MED FILL — Medication: Qty: 1 | Status: AC

## 2013-09-01 NOTE — Progress Notes (Signed)
Name: James Mccormick MRN: [id number] DOB: 12/14/1965    ADMISSION DATE:  08/28/2013 CONSULTATION DATE:  08/28/13  REFERRING MD :  Redmond Pulling PRIMARY SERVICE: [facility]  CHIEF COMPLAINT:  Abdominal Pain  BRIEF PATIENT DESCRIPTION:  48 yo male smoker presented with abdominal pain from perforated gastric ulcer in setting of NSAID.  PCCM consulted to assist with septic shock and respiratory failure management.  SIGNIFICANT EVENTS: 1/8 Exploratory laparotomy, debridement of necrotic stomach with scissors, primary repair of perforated stomach in 2 layers.  Septic shock, VDRF, a fib post-op. 1/9 Cardiology consulted  1/11: still pressor dependent, NSR for > 24 hrs. Amiodarone stopped.  1/12: remain son levophed  STUDIES:  1/8 CT abd/pelvis >> pneumoperitoneum, Lt kidney stone, diverticulosis 1/9 Echo >> mild LVH, EF 40 to 38%, grade 1 diastolic dysfx  LINES / TUBES: 1/8 ETT >>  1/8 Rt IJ CVL >> 1/8 Lt radial aline >>>1/12  CULTURES: Blood 1/9 >>  ANTIBIOTICS: Vancomycin 1/8 >>> Zosyn 1/8 >>>> Micafungin 1/9 >>>>  SUBJECTIVE:  On levopho 28 mics  VITAL SIGNS: Temp:  [98.3 F (36.8 C)-98.9 F (37.2 C)] 98.8 F (37.1 C) (01/12 0852) Pulse Rate:  [72-100] 95 (01/12 1000) Resp:  [0-24] 14 (01/12 1000) BP: (82-135)/(47-93) 111/93 mmHg (01/12 1000) SpO2:  [96 %-100 %] 100 % (01/12 1000) Arterial Line BP: (71-158)/(42-85) 86/82 mmHg (01/12 0730) FiO2 (%):  [40 %-50 %] 40 % (01/12 0845) Weight:  [119 kg (262 lb 5.6 oz)] 119 kg (262 lb 5.6 oz) (01/12 0500) HEMODYNAMICS: CVP:  [12 mmHg-20 mmHg] 12 mmHg VENTILATOR SETTINGS: Vent Mode:  [-] PRVC FiO2 (%):  [40 %-50 %] 40 % Set Rate:  [24 bmp] 24 bmp Vt Set:  [530 mL] 530 mL PEEP:  [5 cmH20-8 cmH20] 5 cmH20 Plateau Pressure:  [24 cmH20-28 cmH20] 24 cmH20 INTAKE / OUTPUT: Intake/Output     01/11 0701 - 01/12 0700 01/12 0701 - 01/13 0700   I.V. (mL/kg) 4511.5 (37.9) 407.1 (3.4)   NG/GT 90 30   IV Piggyback 800 560   TPN 1470 140    Total Intake(mL/kg) 6871.5 (57.7) 1137.1 (9.6)   Urine (mL/kg/hr) 3690 (1.3) 1100 (2.8)   Emesis/NG output 600 (0.2)    Drains 75 (0) 10 (0)   Total Output 4365 1110   Net +2506.5 +27.1          PHYSICAL EXAMINATION: General: no distress, heavily sedated, rass -2 Neuro:  RASS -2 HEENT: ETT in place Cardiovascular: s1 s2 SR no r/g Lungs: coarse bilat Abdomen: wound dressing clean, drains w/ ser/sang drainage, abd distended, hypoactive   Musculoskeletal: 1+ edema, gen Skin: lower extremities cool  LABS:  CBC  Recent Labs Lab 08/30/13 1828 08/31/13 0330 09/01/13 0525  WBC 9.7 6.7 4.5  HGB 13.3 12.4* 12.0*  HCT 39.0 35.3* 35.1*  PLT 160 136* 125*   BMET  Recent Labs Lab 08/31/13 1320 08/31/13 1824 09/01/13 0525  NA 132* 129* 136*  K 3.7 3.9 3.5*  CL 98 94* 101  CO2 _0 BUN 37* 37* 32*  CREATININE 1.40* 1.40* 1.29  GLUCOSE 149* 120* 139*   Electrolytes  Recent Labs Lab 08/31/13 0330 08/31/13 1320 08/31/13 1824 09/01/13 0525  CALCIUM 6.3* 6.0* 6.5* 7.0*  MG 1.6  --  1.7 2.0  PHOS 3.4  --  2.4 1.9*   Liver Enzymes  Recent Labs Lab 08/30/13 1147 08/31/13 0330 09/01/13 0525  AST 86* 97* 54*  ALT 45 61* 48  ALKPHOS 38*  43 50  BILITOT 0.3 0.5 0.7  ALBUMIN 1.9* 1.8* 1.7*    Imaging Dg Chest Port 1 View  09/01/2013   CLINICAL DATA:  48 year old male with perforated stomach, sepsis, respiratory distress. Initial encounter.  EXAM: PORTABLE CHEST - 1 VIEW  COMPARISON:  08/31/2013 and earlier.  FINDINGS: Portable AP semi upright view at 0544 hrs. Endotracheal tube tip in stable position between the clavicles and carina. Enteric tube courses to the left upper quadrant, tip not included. Stable right IJ central line. Increased appearance of bilateral veiling opacity in the lungs. Confluent retrocardiac opacity. Mildly decreased bilateral perihilar opacity, favor atelectasis. Normal cardiac size and mediastinal contours. No pneumothorax or  pneumoperitoneum identified.  IMPRESSION: 1.  Stable lines and tubes. 2. Bilateral pleural effusions with lower lobe collapse/consolidation.   Electronically Signed   By: Lars Pinks M.D.   On: 09/01/2013 07:48   Dg Chest Port 1 View  08/31/2013   CLINICAL DATA:  Respiratory failure.  EXAM: PORTABLE CHEST - 1 VIEW  COMPARISON:  08/30/2013  FINDINGS: Endotracheal tube remains with the tip approximately 3.5 cm above the carina. Lungs show lower volumes compared to the prior study with increasing atelectasis in the left perihilar lung and right lung base. Stable appearance of central line and nasogastric tube. No gross edema or pleural effusions.  IMPRESSION: Lower lung volumes bilaterally with increased bilateral atelectasis.   Electronically Signed   By: Aletta Edouard M.D.   On: 08/31/2013 08:42  increased bibasilar atx/effusions    ASSESSMENT / PLAN:  PULMONARY A:  Acute respiratory failure in setting of peritonitis Admissions asp? RLL ATX Bibasilar atx, probable sympathetic effusions 1/11 Hx of smoking >> air trapping on vent 1/10. P:   -ABg reviewed, maintain current MV needs -peep reduction goal to 5 -may hold off weaning with almost 30 mics levophed required  -pcxr  CARDIOVASCULAR A:  Septic shock 2nd to peritonitis. A fib with RVR, resolved, SR for > 76HYW  Acute systolic heart failure likely 2nd to sepsis. NSTEMI from demand ischemia. Hx of HTN with chronic diastolic dysfx. P:  -goal CVP met -wean pressors to keep SBP > 90, MAP > 65 -low threshold to restart amio -levophed to MAP goals -if levo rises consider vasopressin addition -cortisol repeat in 2 days if remains on pressors -will nee repeat echo in weeks  RENAL A:   Acute kidney injury 2nd to hypovolemia, and ATN in setting of shock. He has had some marginal bump in scr but hoping that this reflects plateau  Metabolic acidosis >> improved. P:   -maintain pos balance, watch cl , may need to 1/2 NS -f/u chemistry  in am   GASTROINTESTINAL A:   Perforated gastric ulcer in setting of NSAID use as outpt. Protein calorie malnutrition. P:   -post -op care per [facility] -protonix BID -continue TNA per [facility]  HEMATOLOGIC A:   DVT prevention P:  -f/u CBC in am  -SQ heparin for DVT prevention  INFECTIOUS A:  Septic shock 2nd to peritonitis. P:   -continue vancomycin, zosyn, micafungin with limited pressor imrpovement, may consider change zosyn to Imipenem if pressors rise -drain management per surg -no role pct  ENDOCRINE A:   Hyperglycemia. P:   -SSI -at risk burn out adrenal insuff, repeat cortisol in 2 days if not improved   NEUROLOGIC A:   Acute encephalopathy 2nd to septic shock. P:   -goal RASS -1 now\ -goal to dc versed drip, cont fent  Ccm time 30 min  Lavon Paganini. Titus Mould, MD, [place] Pgr: [facility]

## 2013-09-01 NOTE — Progress Notes (Signed)
Pharmacy: Vancomycin  47yom continues on day #5 vancomycin for peritonitis/perforated bowel. Vancomycin trough drawn today is still slightly supratherapeutic despite dose decrease, improving renal function, and good UOP. The nurse assures me that the level was drawn accurately.   VT (1/10): 22.6 on 1000 mg IV Q 8 hours  VT (1/12): 22.8 on 750mg  IV Q 8 hours  Vancomycin 1/8>> Zosyn 1/8>> Micafungin 1/9>>  1/9 MRSA - NEG 1/9 Blood>> ngtd  Plan: 1) Change vancomycin to 1g IV q12 2) Check another trough at steady state  Louie CasaJennifer Erryn Dickison, PharmD, BCPS 09/01/2013, 3:25 PM

## 2013-09-01 NOTE — Progress Notes (Addendum)
15cc versed wasted in sink.  Witnessed by Deberah PeltonKari Miyako Oelke, RN.  Roselie AwkwardShannon Love, RN  Deberah PeltonKari Maicy Filip, RN

## 2013-09-01 NOTE — Progress Notes (Addendum)
PARENTERAL NUTRITION CONSULT NOTE - FOLLOW UP  Pharmacy Consult for TPN Indication: Prolonged ileus  No Known Allergies  Patient Measurements: Height: 5\' 11"  (180.3 cm) Weight: 225 lb (102.059 kg) IBW/kg (Calculated) : 75.3 Adjusted Body Weight:  Usual Weight:   Vital Signs: Temp: 98.9 F (37.2 C) (01/12 0000) Temp src: Rectal (01/12 0000) BP: 107/62 mmHg (01/12 0615) Pulse Rate: 80 (01/12 0615) Intake/Output from previous day: 01/11 0701 - 01/12 0700 In: 6676.5 [I.V.:4386.5; NG/GT:90; IV Piggyback:800; TPN:1400] Out: 4365 [Urine:3690; Emesis/NG output:600; Drains:75] Intake/Output from this shift:    Labs:  Recent Labs  08/30/13 1828 08/31/13 0330 09/01/13 0525  WBC 9.7 6.7 4.5  HGB 13.3 12.4* 12.0*  HCT 39.0 35.3* 35.1*  PLT 160 136* 125*     Recent Labs  08/30/13 0256  08/30/13 1147 08/31/13 0330 08/31/13 1320 08/31/13 1824 09/01/13 0525  NA  --   < > 135* 133* 132* 129* 136*  K  --   < > 3.9 3.6* 3.7 3.9 3.5*  CL  --   < > 98 96 98 94* 101  CO2  --   < > 24 23 21 22 24   GLUCOSE  --   < > 192* 147* 149* 120* 139*  BUN  --   < > 35* 39* 37* 37* 32*  CREATININE  --   < > 1.64* 1.73* 1.40* 1.40* 1.29  CALCIUM  --   < > 5.9* 6.3* 6.0* 6.5* 7.0*  MG 2.0  --   --  1.6  --  1.7 2.0  PHOS 3.9  --   --  3.4  --  2.4 1.9*  PROT  --   < > 4.1* 4.2*  --   --  4.7*  ALBUMIN  --   < > 1.9* 1.8*  --   --  1.7*  AST  --   < > 86* 97*  --   --  54*  ALT  --   < > 45 61*  --   --  48  ALKPHOS  --   < > 38* 43  --   --  50  BILITOT  --   < > 0.3 0.5  --   --  0.7  PREALBUMIN 8.9*  --   --   --   --   --   --   TRIG 161*  --   --   --   --   --  250*  < > = values in this interval not displayed. Estimated Creatinine Clearance: 84.3 ml/min (by C-G formula based on Cr of 1.29).    Recent Labs  08/31/13 1528 08/31/13 1931 08/31/13 2335  GLUCAP 133* 109* 130*    Medications:  Scheduled:  . antiseptic oral rinse  15 mL Mouth Rinse Q4H  . chlorhexidine  15  mL Mouth/Throat BID  . heparin subcutaneous  5,000 Units Subcutaneous Q8H  . insulin aspart  1-3 Units Subcutaneous Q4H  . micafungin (MYCAMINE) IV  100 mg Intravenous Q24H  . pantoprazole (PROTONIX) IV  40 mg Intravenous Q12H  . piperacillin-tazobactam (ZOSYN)  IV  3.375 g Intravenous Q8H  . vancomycin  750 mg Intravenous Q8H    Insulin Requirements in the past 24 hours:  9 units Novolog ICU-SSI and 15 units/L in TPN, cbgs 109-141  Current Nutrition:  NPO   Assessment:  48 y.o. male presented to ED 1/8 with abd pain. CT consistent with perforated stomach ulcer due to NSAID use; pneumoperitoneum; peritonitis.  Taken emergently to OR where he underwent exp lap, debridement of necrotic stomach, repair of perforated stomach. Pt must be strict NPO until contrast study can be done to eval gastric repair which will be several days from now. TPN to be started in the interim.   GI: NPO.  Hypoactive BS, abd distended.  Endo: No h/o DM. CBGs 109-141  Lytes: Na 136, K 3.5, Mg 2.0, Phos 1.9, Ca-corr 8.84  Renal: Cr 1.29- increasing. UOP 1.3 ml/Kg  Pulm: VDRF since surgery. FiO2 40%   Cards: Pt on pressors ( NE at 30 ml/hr)  Inc Trop- NSTEMI vs demand ischemia. Back in NSR, amio off   Hepatobil: AST 24, TG 250  Neuro: Sedated on fentanyl and versed. RASS goal -2.   ID: Vanc/Zosyn/Micafungin D#4 for perf stomach with necrosis. Serosanguinous fluid from drains.  WBC wnl, Afeb  Best Practices: Enox, PPI IV, MC  TPN Access: CVC 08/29/13  TPN day#: 3  Nutritional Goals:  Goal 2300-2400kcal and 150-160gm protein per RD recommendations   Plan:  1. Inc Clinimix E 5/15 to 80 ml/hr with lipids at 4910ml/hr 2.  KPhos bolus 30mmol IV X 1 2. Continue ICU-SSI q4h  3. Cont Insulin at 15units/L  4.  KCl 10 mEq IV X 3  James Mccormick PharmD Clinical Pharmacist [facility]

## 2013-09-01 NOTE — Progress Notes (Signed)
4 Days Post-Op  Subjective: On vent.  Down to 1 pressor.   Objective: Vital signs in last 24 hours: Temp:  [98.3 F (36.8 C)-98.9 F (37.2 C)] 98.8 F (37.1 C) (01/12 0852) Pulse Rate:  [72-100] 97 (01/12 0845) Resp:  [0-24] 24 (01/12 0845) BP: (82-135)/(47-72) 111/66 mmHg (01/12 0845) SpO2:  [96 %-100 %] 100 % (01/12 0845) Arterial Line BP: (71-158)/(42-85) 86/82 mmHg (01/12 0730) FiO2 (%):  [40 %-60 %] 40 % (01/12 0845) Weight:  [262 lb 5.6 oz (119 kg)] 262 lb 5.6 oz (119 kg) (01/12 0500)    Intake/Output from previous day: 01/11 0701 - 01/12 0700 In: 6676.5 [I.V.:4386.5; NG/GT:90; IV Piggyback:800; TPN:1400] Out: 4365 [Urine:3690; Emesis/NG output:600; Drains:75] Intake/Output this shift: Total I/O In: 88.1 [I.V.:88.1] Out: 1110 [Urine:1100; Drains:10]  Incision/Wound:open and clean.  Intact. Soft drains look serous.   Lab Results:   Recent Labs  08/31/13 0330 09/01/13 0525  WBC 6.7 4.5  HGB 12.4* 12.0*  HCT 35.3* 35.1*  PLT 136* 125*   BMET  Recent Labs  08/31/13 1824 09/01/13 0525  NA 129* 136*  K 3.9 3.5*  CL 94* 101  CO2 22 24  GLUCOSE 120* 139*  BUN 37* 32*  CREATININE 1.40* 1.29  CALCIUM 6.5* 7.0*   PT/INR No results found for this basename: LABPROT, INR,  in the last 72 hours ABG  Recent Labs  08/31/13 1819 09/01/13 0430  PHART 7.389 7.331*  HCO3 24.9* 24.4*    Studies/Results: Dg Chest Port 1 View  09/01/2013   CLINICAL DATA:  48 year old male with perforated stomach, sepsis, respiratory distress. Initial encounter.  EXAM: PORTABLE CHEST - 1 VIEW  COMPARISON:  08/31/2013 and earlier.  FINDINGS: Portable AP semi upright view at 0544 hrs. Endotracheal tube tip in stable position between the clavicles and carina. Enteric tube courses to the left upper quadrant, tip not included. Stable right IJ central line. Increased appearance of bilateral veiling opacity in the lungs. Confluent retrocardiac opacity. Mildly decreased bilateral perihilar  opacity, favor atelectasis. Normal cardiac size and mediastinal contours. No pneumothorax or pneumoperitoneum identified.  IMPRESSION: 1.  Stable lines and tubes. 2. Bilateral pleural effusions with lower lobe collapse/consolidation.   Electronically Signed   By: Augusto GambleLee  Hall M.D.   On: 09/01/2013 07:48   Dg Chest Port 1 View  08/31/2013   CLINICAL DATA:  Respiratory failure.  EXAM: PORTABLE CHEST - 1 VIEW  COMPARISON:  08/30/2013  FINDINGS: Endotracheal tube remains with the tip approximately 3.5 cm above the carina. Lungs show lower volumes compared to the prior study with increasing atelectasis in the left perihilar lung and right lung base. Stable appearance of central line and nasogastric tube. No gross edema or pleural effusions.  IMPRESSION: Lower lung volumes bilaterally with increased bilateral atelectasis.   Electronically Signed   By: Irish LackGlenn  Yamagata M.D.   On: 08/31/2013 08:42    Anti-infectives: Anti-infectives   Start     Dose/Rate Route Frequency Ordered Stop   08/30/13 2000  vancomycin (VANCOCIN) IVPB 750 mg/150 ml premix     750 mg 150 mL/hr over 60 Minutes Intravenous Every 8 hours 08/30/13 1356     08/29/13 2200  micafungin (MYCAMINE) 100 mg in sodium chloride 0.9 % 100 mL IVPB     100 mg 100 mL/hr over 1 Hours Intravenous Every 24 hours 08/29/13 2036     08/29/13 0400  vancomycin (VANCOCIN) IVPB 1000 mg/200 mL premix  Status:  Discontinued     1,000 mg 200 mL/hr  over 60 Minutes Intravenous Every 8 hours 08/28/13 2358 08/30/13 1356   08/29/13 0400  piperacillin-tazobactam (ZOSYN) IVPB 3.375 g     3.375 g 12.5 mL/hr over 240 Minutes Intravenous 3 times per day 08/28/13 2358     08/28/13 1845  [MAR Hold]  vancomycin (VANCOCIN) IVPB 1000 mg/200 mL premix     (On MAR Hold since 08/28/13 1957)   1,000 mg 200 mL/hr over 60 Minutes Intravenous  Once 08/28/13 1831 08/28/13 1955   08/28/13 1845  [MAR Hold]  piperacillin-tazobactam (ZOSYN) IVPB 3.375 g     (On MAR Hold since 08/28/13  1957)   3.375 g 100 mL/hr over 30 Minutes Intravenous  Once 08/28/13 1831 08/28/13 2056      Assessment/Plan: s/p Procedure(s): EXPLORATORY LAPAROTOMY,debridment of nacrotic stomach,and primary repair of perforated stomach. (N/A) Weaning off pressors.  Wounds ok.  Cont TNA.  CONTRAST STUDY OF STOMACH LATER THIS WEEK.  CONT SUPPORTIVE CARE AND APPRECIATE CCM HELP.    LOS: 4 days    Guy Toney A. 09/01/2013

## 2013-09-02 ENCOUNTER — Inpatient Hospital Stay (HOSPITAL_COMMUNITY): Payer: Medicaid Other

## 2013-09-02 LAB — GLUCOSE, CAPILLARY
GLUCOSE-CAPILLARY: 112 mg/dL — AB (ref 70–99)
GLUCOSE-CAPILLARY: 92 mg/dL (ref 70–99)
GLUCOSE-CAPILLARY: 118 mg/dL — AB (ref 70–99)
GLUCOSE-CAPILLARY: 102 mg/dL — AB (ref 70–99)
Glucose-Capillary: 79 mg/dL (ref 70–99)
Glucose-Capillary: 96 mg/dL (ref 70–99)
Glucose-Capillary: 88 mg/dL (ref 70–99)
Glucose-Capillary: 79 mg/dL (ref 70–99)

## 2013-09-02 LAB — BASIC METABOLIC PANEL
BUN: 26 mg/dL — ABNORMAL HIGH (ref 6–23)
CO2: 26 meq/L (ref 19–32)
Calcium: 7.1 mg/dL — ABNORMAL LOW (ref 8.4–10.5)
Chloride: 108 mEq/L (ref 96–112)
Creatinine, Ser: 1.34 mg/dL (ref 0.50–1.35)
GFR calc Af Amer: 70 mL/min — ABNORMAL LOW (ref >90)
GFR calc non Af Amer: 61 mL/min — ABNORMAL LOW (ref >90)
GLUCOSE: 97 mg/dL (ref 70–99)
POTASSIUM: 3.7 meq/L (ref 3.7–5.3)
SODIUM: 143 meq/L (ref 137–147)

## 2013-09-02 LAB — PHOSPHORUS: Phosphorus: 2.2 mg/dL — ABNORMAL LOW (ref 2.3–4.6)

## 2013-09-02 MED ORDER — FAT EMULSION 20 % IV EMUL
240.0000 mL | INTRAVENOUS | Status: AC
  Administered 2013-09-02: 240 mL via INTRAVENOUS
  Filled 2013-09-02: qty 250

## 2013-09-02 MED ORDER — MIDAZOLAM HCL 2 MG/2ML IJ SOLN
2.0000 mg | Freq: Once | INTRAMUSCULAR | Status: AC
  Administered 2013-09-02: 2 mg via INTRAVENOUS

## 2013-09-02 MED ORDER — FAT EMULSION 20 % IV EMUL
240.0000 mL | INTRAVENOUS | Status: DC
  Filled 2013-09-02: qty 250

## 2013-09-02 MED ORDER — TRACE MINERALS CR-CU-F-FE-I-MN-MO-SE-ZN IV SOLN
INTRAVENOUS | Status: AC
  Administered 2013-09-02: 18:00:00 via INTRAVENOUS
  Filled 2013-09-02: qty 2400

## 2013-09-02 MED ORDER — TRACE MINERALS CR-CU-F-FE-I-MN-MO-SE-ZN IV SOLN
INTRAVENOUS | Status: DC
  Filled 2013-09-02: qty 2000

## 2013-09-02 MED ORDER — INFLUENZA VAC SPLIT QUAD 0.5 ML IM SUSP
0.5000 mL | INTRAMUSCULAR | Status: DC | PRN
  Filled 2013-09-02: qty 0.5

## 2013-09-02 MED ORDER — DEXTROSE 5 % IV SOLN
30.0000 ug/min | INTRAVENOUS | Status: DC
  Administered 2013-09-02: 35 ug/min via INTRAVENOUS
  Administered 2013-09-02 (×2): 30 ug/min via INTRAVENOUS
  Administered 2013-09-03: 20 ug/min via INTRAVENOUS
  Administered 2013-09-03: 40 ug/min via INTRAVENOUS
  Administered 2013-09-03: 20 ug/min via INTRAVENOUS
  Filled 2013-09-02 (×8): qty 1

## 2013-09-02 MED ORDER — FUROSEMIDE 10 MG/ML IJ SOLN
40.0000 mg | Freq: Three times a day (TID) | INTRAMUSCULAR | Status: DC
  Administered 2013-09-02 – 2013-09-03 (×3): 40 mg via INTRAVENOUS
  Filled 2013-09-02 (×6): qty 4

## 2013-09-02 MED ORDER — MIDAZOLAM HCL 2 MG/2ML IJ SOLN
INTRAMUSCULAR | Status: AC
  Filled 2013-09-02: qty 2

## 2013-09-02 MED ORDER — INSULIN ASPART 100 UNIT/ML ~~LOC~~ SOLN
1.0000 [IU] | Freq: Four times a day (QID) | SUBCUTANEOUS | Status: DC
  Administered 2013-09-04 – 2013-09-06 (×3): 1 [IU] via SUBCUTANEOUS
  Administered 2013-09-06 – 2013-09-08 (×6): 2 [IU] via SUBCUTANEOUS

## 2013-09-02 MED ORDER — PNEUMOCOCCAL VAC POLYVALENT 25 MCG/0.5ML IJ INJ
0.5000 mL | INJECTION | INTRAMUSCULAR | Status: DC | PRN
  Filled 2013-09-02: qty 0.5

## 2013-09-02 MED ORDER — DEXMEDETOMIDINE HCL IN NACL 200 MCG/50ML IV SOLN
0.4000 ug/kg/h | INTRAVENOUS | Status: DC
  Administered 2013-09-02: 0.3 ug/kg/h via INTRAVENOUS
  Administered 2013-09-02 (×2): 0.5 ug/kg/h via INTRAVENOUS
  Administered 2013-09-02: 0.3 ug/kg/h via INTRAVENOUS
  Administered 2013-09-02: 1.2 ug/kg/h via INTRAVENOUS
  Filled 2013-09-02 (×6): qty 50

## 2013-09-02 MED ORDER — POTASSIUM PHOSPHATE DIBASIC 3 MMOLE/ML IV SOLN
20.0000 mmol | Freq: Once | INTRAVENOUS | Status: AC
  Administered 2013-09-02: 20 mmol via INTRAVENOUS
  Filled 2013-09-02: qty 6.67

## 2013-09-02 NOTE — Progress Notes (Signed)
Name: James Mccormick MRN: [id number] DOB: 08/24/1965    ADMISSION DATE:  08/28/2013 CONSULTATION DATE:  08/28/13  REFERRING MD :  Andrey CampanileWilson PRIMARY SERVICE: CCS  CHIEF COMPLAINT:  Abdominal Pain  BRIEF PATIENT DESCRIPTION:  48 yo male smoker presented with abdominal pain from perforated gastric ulcer in setting of NSAID.  PCCM consulted to assist with septic shock and respiratory failure management.  SIGNIFICANT EVENTS: 1/8 Exploratory laparotomy, debridement of necrotic stomach with scissors, primary repair of perforated stomach in 2 layers.  Septic shock, VDRF, a fib post-op. 1/9 Cardiology consulted  1/11: still pressor dependent, NSR for > 24 hrs. Amiodarone stopped.  1/12: remain son levophed  STUDIES:  1/8 CT abd/pelvis >> pneumoperitoneum, Lt kidney stone, diverticulosis 1/9 Echo >> mild LVH, EF 40 to 45%, grade 1 diastolic dysfx  LINES / TUBES: 1/8 ETT >>  1/8 Rt IJ CVL >> 1/8 Lt radial aline >>>1/12  CULTURES: Blood 1/9 >>  ANTIBIOTICS: Vancomycin 1/8 >>> Zosyn 1/8 >>>> Micafungin 1/9 >>>>  SUBJECTIVE:  1/12: On levopho 28 mics  09/02/13: off levophed. Encephalopathic - randomly agitated. Moving around, biting tube, looks randomly, does not track. Failed SBT. Febrile. + 24 Liters since admit 08/28/2013 . TPN continues   VITAL SIGNS: Temp:  [99.2 F (37.3 C)-101.1 F (38.4 C)] 100.3 F (37.9 C) (01/13 0733) Pulse Rate:  [86-120] 107 (01/13 0800) Resp:  [0-25] 19 (01/13 0800) BP: (90-138)/(50-93) 112/75 mmHg (01/13 0800) SpO2:  [95 %-100 %] 99 % (01/13 0800) FiO2 (%):  [40 %] 40 % (01/13 0800) HEMODYNAMICS: CVP:  [10 mmHg-13 mmHg] 11 mmHg VENTILATOR SETTINGS: Vent Mode:  [-] PRVC FiO2 (%):  [40 %] 40 % Set Rate:  [24 bmp] 24 bmp Vt Set:  [530 mL] 530 mL PEEP:  [5 cmH20] 5 cmH20 Plateau Pressure:  [20 cmH20-30 cmH20] 20 cmH20 INTAKE / OUTPUT: Intake/Output     01/12 0701 - 01/13 0700 01/13 0701 - 01/14 0700   I.V. (mL/kg) 3271.2 (27.5)    NG/GT 90     IV Piggyback 1060    TPN 1850    Total Intake(mL/kg) 6271.2 (52.7)    Urine (mL/kg/hr) 3805 (1.3)    Emesis/NG output 950 (0.3)    Drains 45 (0)    Total Output 4800     Net +1471.2            PHYSICAL EXAMINATION: General: no distress, heavily sedated, rass -2 Neuro:  RASS -2 HEENT: ETT in place Cardiovascular: s1 s2 SR no r/g Lungs: coarse bilat Abdomen: wound dressing clean, drains w/ ser/sang drainage, abd distended, hypoactive   Musculoskeletal: 1+ edema, gen Skin: lower extremities cool  LABS: PULMONARY  Recent Labs Lab 08/30/13 0952 08/30/13 1834 08/31/13 0350 08/31/13 1819 09/01/13 0430  PHART 7.405 7.302* 7.348* 7.389 7.331*  PCO2ART 42.6 53.4* 42.1 41.1 46.1*  PO2ART 73.0* 75.0* 102.0* 73.0* 73.0*  HCO3 26.7* 25.8* 23.1 24.9* 24.4*  TCO2 28 27 24 26 26   O2SAT 95.0 91.0 97.0 95.0 93.0    CBC  Recent Labs Lab 08/30/13 1828 08/31/13 0330 09/01/13 0525  HGB 13.3 12.4* 12.0*  HCT 39.0 35.3* 35.1*  WBC 9.7 6.7 4.5  PLT 160 136* 125*    COAGULATION No results found for this basename: INR,  in the last 168 hours  CARDIAC   Recent Labs Lab 08/29/13 1933 08/30/13 0256 08/30/13 0638  TROPONINI <0.30 <0.30 1.22*   No results found for this basename: PROBNP,  in the last 168 hours  CHEMISTRY  Recent Labs Lab 08/29/13 0420  08/30/13 0256  08/31/13 0330 08/31/13 1320 08/31/13 1824 09/01/13 0525 09/02/13 0425  NA 146  < >  --   < > 133* 132* 129* 136* 143  K 3.7  < >  --   < > 3.6* 3.7 3.9 3.5* 3.7  CL 112  < >  --   < > 96 98 94* 101 108  CO2 18*  < >  --   < > 23 21 22 24 26   GLUCOSE 239*  < >  --   < > 147* 149* 120* 139* 97  BUN 24*  < >  --   < > 39* 37* 37* 32* 26*  CREATININE 1.07  < >  --   < > 1.73* 1.40* 1.40* 1.29 1.34  CALCIUM 5.6*  < >  --   < > 6.3* 6.0* 6.5* 7.0* 7.1*  MG 1.6  --  2.0  --  1.6  --  1.7 2.0  --   PHOS 1.9*  --  3.9  --  3.4  --  2.4 1.9* 2.2*  < > = values in this interval not displayed. Estimated  Creatinine Clearance: 87.5 ml/min (by C-G formula based on Cr of 1.34).   LIVER  Recent Labs Lab 08/29/13 0420 08/30/13 0638 08/30/13 1147 08/31/13 0330 09/01/13 0525  AST 56* 79* 86* 97* 54*  ALT 41 41 45 61* 48  ALKPHOS 27* 34* 38* 43 50  BILITOT 0.4 0.3 0.3 0.5 0.7  PROT 4.0* 3.9* 4.1* 4.2* 4.7*  ALBUMIN 2.4* 1.8* 1.9* 1.8* 1.7*     INFECTIOUS  Recent Labs Lab 08/29/13 0102 08/29/13 0857 08/29/13 0930 08/29/13 1821 08/30/13 0258 08/31/13 0330  LATICACIDVEN 4.1*  --  3.7* 4.1*  --   --   PROCALCITON  --  91.57  --   --  180.37 138.17     ENDOCRINE CBG (last 3)   Recent Labs  09/01/13 2333 09/02/13 0352 09/02/13 0710  GLUCAP 88 79 92         IMAGING x48h  Dg Chest Port 1 View  09/02/2013   CLINICAL DATA:  ETT placement.  EXAM: PORTABLE CHEST - 1 VIEW  COMPARISON:  09/01/2013  FINDINGS: Endotracheal tube is present with tip approximately 4 cm above the carina. Right IJ central venous catheter is unchanged. Nasogastric tube courses off the inferior portion of the film and tip is not visualized. Lungs are somewhat hypoinflated with slight worsening hazy opacification over the mid to lower lungs likely moderate size effusions with associated atelectasis, although cannot exclude infection or a component of interstitial edema. There is mild stable cardiomegaly. Remainder of the exam is unchanged.  IMPRESSION: Slight worsening hazy opacification over the mid to lower lungs likely moderate size effusions with associated atelectasis. Cannot exclude infection or a component of interstitial edema.  Tubes and lines as described.   Electronically Signed   By: Elberta Fortis M.D.   On: 09/02/2013 08:20   Dg Chest Port 1 View  09/01/2013   CLINICAL DATA:  48 year old male with perforated stomach, sepsis, respiratory distress. Initial encounter.  EXAM: PORTABLE CHEST - 1 VIEW  COMPARISON:  08/31/2013 and earlier.  FINDINGS: Portable AP semi upright view at 0544 hrs.  Endotracheal tube tip in stable position between the clavicles and carina. Enteric tube courses to the left upper quadrant, tip not included. Stable right IJ central line. Increased appearance of bilateral veiling opacity in the lungs. Confluent retrocardiac  opacity. Mildly decreased bilateral perihilar opacity, favor atelectasis. Normal cardiac size and mediastinal contours. No pneumothorax or pneumoperitoneum identified.  IMPRESSION: 1.  Stable lines and tubes. 2. Bilateral pleural effusions with lower lobe collapse/consolidation.   Electronically Signed   By: Augusto Gamble M.D.   On: 09/01/2013 07:48       ASSESSMENT / PLAN:  PULMONARY A:  Acute respiratory failure in setting of peritonitis Admissions asp? RLL ATX Bibasilar atx, probable sympathetic effusions 1/11 Hx of smoking >> air trapping on vent 1/10.  09/02/13: Failed SBT. Volume overloaded, ICU delirium +  P:   -Full vent support   CARDIOVASCULAR A:  Septic shock 2nd to peritonitis. A fib with RVR, resolved, SR for > 24hrs  Acute systolic heart failure likely 2nd to sepsis. NSTEMI from demand ischemia. Hx of HTN with chronic diastolic dysfx. ECHO 1/19 with systolic CHF  09/02/13: + 24L since admit 08/28/2013. CVP 11. 3rd spacing +   P:  - diurese aggressively - RENAL A:   Acute kidney injury 2nd to hypovolemia, and ATN in setting of shock. He has had some marginal bump in scr but hoping that this reflects plateau  Metabolic acidosis >> improved.  09/02/13: Improved renal failure. Low phos  P:   -replete phos - monitopr crat with lasix  - reduce IV fluids  GASTROINTESTINAL A:   Perforated gastric ulcer in setting of NSAID use as outpt. Protein calorie malnutrition. P:   -post -op care per CCS -protonix BID -continue TNA per CCS  HEMATOLOGIC A:   Anemia of critical illness Low platelet ? Due to sepsis  P:  -f/u CBC in am  -SQ heparin for DVT prevention - check HIT  INFECTIOUS A:  Septic shock 2nd  to peritonitis.  09/02/13: Off pressors x 24h P:   -continue vancomycin, zosyn, micafungin with limited pressor imrpovement, may consider change zosyn to Imipenem if pressors rise -drain management per surg -no role pct  ENDOCRINE A:   Hyperglycemia. P:   -SSI -at risk burn out adrenal insuff, repeat cortisol in 2 days if not improved   NEUROLOGIC A:   Acute encephalopathy 2nd to septic shock. 09/02/13: Off versed. Still enceophalopathic  P:   -goal RASS -1 now\ - cont fent - addd precedex   GLOBAL 09/02/13: Tammy the significant other updated at bedside with RN present   The patient is critically ill with multiple organ systems failure and requires high complexity decision making for assessment and support, frequent evaluation and titration of therapies, application of advanced monitoring technologies and extensive interpretation of multiple databases.   Critical Care Time devoted to patient care services described in this note is  35  Minutes.  Dr. Kalman Shan, M.D., [facility].C.P Pulmonary and Critical Care Medicine Staff Physician [facility] Pager: [phone], If no answer or between  15:00h - 7:00h: call [phone]  09/02/2013 9:31 AM

## 2013-09-02 NOTE — Progress Notes (Signed)
5 Days Post-Op  Subjective: Off pressors but on vent.    Objective: Vital signs in last 24 hours: Temp:  [98.8 F (37.1 C)-101.1 F (38.4 C)] 100.3 F (37.9 C) (01/13 0733) Pulse Rate:  [86-120] 104 (01/13 0700) Resp:  [0-25] 24 (01/13 0700) BP: (90-138)/(50-93) 98/53 mmHg (01/13 0700) SpO2:  [95 %-100 %] 100 % (01/13 0700) FiO2 (%):  [40 %] 40 % (01/13 0600)    Intake/Output from previous day: 01/12 0701 - 01/13 0700 In: 6271.2 [I.V.:3271.2; NG/GT:90; IV Piggyback:1060; TPN:1850] Out: 4800 [Urine:3805; Emesis/NG output:950; Drains:45] Intake/Output this shift:    Incision/Wound:open and clean.  JP serous drainage  Lab Results:   Recent Labs  08/31/13 0330 09/01/13 0525  WBC 6.7 4.5  HGB 12.4* 12.0*  HCT 35.3* 35.1*  PLT 136* 125*   BMET  Recent Labs  09/01/13 0525 09/02/13 0425  NA 136* 143  K 3.5* 3.7  CL 101 108  CO2 24 26  GLUCOSE 139* 97  BUN 32* 26*  CREATININE 1.29 1.34  CALCIUM 7.0* 7.1*   PT/INR No results found for this basename: LABPROT, INR,  in the last 72 hours ABG  Recent Labs  08/31/13 1819 09/01/13 0430  PHART 7.389 7.331*  HCO3 24.9* 24.4*    Studies/Results: Dg Chest Port 1 View  09/01/2013   CLINICAL DATA:  48 year old male with perforated stomach, sepsis, respiratory distress. Initial encounter.  EXAM: PORTABLE CHEST - 1 VIEW  COMPARISON:  08/31/2013 and earlier.  FINDINGS: Portable AP semi upright view at 0544 hrs. Endotracheal tube tip in stable position between the clavicles and carina. Enteric tube courses to the left upper quadrant, tip not included. Stable right IJ central line. Increased appearance of bilateral veiling opacity in the lungs. Confluent retrocardiac opacity. Mildly decreased bilateral perihilar opacity, favor atelectasis. Normal cardiac size and mediastinal contours. No pneumothorax or pneumoperitoneum identified.  IMPRESSION: 1.  Stable lines and tubes. 2. Bilateral pleural effusions with lower lobe  collapse/consolidation.   Electronically Signed   By: Augusto Gamble M.D.   On: 09/01/2013 07:48    Anti-infectives: Anti-infectives   Start     Dose/Rate Route Frequency Ordered Stop   09/02/13 1300  vancomycin (VANCOCIN) IVPB 1000 mg/200 mL premix     1,000 mg 200 mL/hr over 60 Minutes Intravenous Every 12 hours 09/01/13 1527     08/30/13 2000  vancomycin (VANCOCIN) IVPB 750 mg/150 ml premix  Status:  Discontinued     750 mg 150 mL/hr over 60 Minutes Intravenous Every 8 hours 08/30/13 1356 09/01/13 1527   08/29/13 2200  micafungin (MYCAMINE) 100 mg in sodium chloride 0.9 % 100 mL IVPB     100 mg 100 mL/hr over 1 Hours Intravenous Every 24 hours 08/29/13 2036     08/29/13 0400  vancomycin (VANCOCIN) IVPB 1000 mg/200 mL premix  Status:  Discontinued     1,000 mg 200 mL/hr over 60 Minutes Intravenous Every 8 hours 08/28/13 2358 08/30/13 1356   08/29/13 0400  piperacillin-tazobactam (ZOSYN) IVPB 3.375 g     3.375 g 12.5 mL/hr over 240 Minutes Intravenous 3 times per day 08/28/13 2358     08/28/13 1845  [MAR Hold]  vancomycin (VANCOCIN) IVPB 1000 mg/200 mL premix     (On MAR Hold since 08/28/13 1957)   1,000 mg 200 mL/hr over 60 Minutes Intravenous  Once 08/28/13 1831 08/28/13 1955   08/28/13 1845  [MAR Hold]  piperacillin-tazobactam (ZOSYN) IVPB 3.375 g     (On MAR Hold since  08/28/13 1957)   3.375 g 100 mL/hr over 30 Minutes Intravenous  Once 08/28/13 1831 08/28/13 2056      Assessment/Plan: s/p Procedure(s): EXPLORATORY LAPAROTOMY,debridment of nacrotic stomach,and primary repair of perforated stomach. (N/A) CCM trying to wean Off pressors Cont TNA  Contrast study thursday  LOS: 5 days    Merrit Friesen A. 09/02/2013

## 2013-09-02 NOTE — Progress Notes (Signed)
Dr Sung AmabileSimonds updated pt status. Pt on 30mcg neo, 40mg  IV lasix ordered (scheduled), MD ok to continue IV lasix while pt on neo gtt. Will continue to monitor. Koren BoundWaggoner, Abubakar Crispo

## 2013-09-02 NOTE — Progress Notes (Signed)
Dr Tyson AliasFeinstein updated pt status. MD updated attempted CPAP on pt with fentanyl gtt at 17900mcg/hr. Pt with RR >30, biting ett, HR increased from 100s to 120-130s (ST). Updated pt restless, bolus of fentanyl given via infusion. Pt back on PRVC. Versed gtt off since 09/01/13, orders received for PRN versed bolus. Will continue to monitor. Koren BoundWaggoner, Rhoderick Farrel

## 2013-09-02 NOTE — Progress Notes (Addendum)
PARENTERAL NUTRITION CONSULT NOTE - FOLLOW UP  Pharmacy Consult for TPN Indication: Prolonged ileus  No Known Allergies  Patient Measurements: Height: 5\' 11"  (180.3 cm) Weight: 262 lb 5.6 oz (119 kg) IBW/kg (Calculated) : 75.3 Adjusted Body Weight:  Usual Weight:   Vital Signs: Temp: 100.4 F (38 C) (01/13 0600) Temp src: Rectal (01/13 0600) BP: 98/53 mmHg (01/13 0700) Pulse Rate: 104 (01/13 0700) Intake/Output from previous day: 01/12 0701 - 01/13 0700 In: 6271.2 [I.V.:3271.2; NG/GT:90; IV Piggyback:1060; TPN:1850] Out: 4800 [Urine:3805; Emesis/NG output:950; Drains:45] Intake/Output from this shift:    Labs:  Recent Labs  08/30/13 1828 08/31/13 0330 09/01/13 0525  WBC 9.7 6.7 4.5  HGB 13.3 12.4* 12.0*  HCT 39.0 35.3* 35.1*  PLT 160 136* 125*     Recent Labs  08/30/13 1147  08/31/13 0330  08/31/13 1824 09/01/13 0525 09/02/13 0425  NA 135*  --  133*  < > 129* 136* 143  K 3.9  --  3.6*  < > 3.9 3.5* 3.7  CL 98  --  96  < > 94* 101 108  CO2 24  --  23  < > 22 24 26   GLUCOSE 192*  --  147*  < > 120* 139* 97  BUN 35*  --  39*  < > 37* 32* 26*  CREATININE 1.64*  --  1.73*  < > 1.40* 1.29 1.34  CALCIUM 5.9*  --  6.3*  < > 6.5* 7.0* 7.1*  MG  --   --  1.6  --  1.7 2.0  --   PHOS  --   < > 3.4  --  2.4 1.9* 2.2*  PROT 4.1*  --  4.2*  --   --  4.7*  --   ALBUMIN 1.9*  --  1.8*  --   --  1.7*  --   AST 86*  --  97*  --   --  54*  --   ALT 45  --  61*  --   --  48  --   ALKPHOS 38*  --  43  --   --  50  --   BILITOT 0.3  --  0.5  --   --  0.7  --   PREALBUMIN  --   --   --   --   --  4.7*  --   TRIG  --   --   --   --   --  250*  --   < > = values in this interval not displayed. Estimated Creatinine Clearance: 87.5 ml/min (by C-G formula based on Cr of 1.34).    Recent Labs  09/01/13 1940 09/01/13 2333 09/02/13 0352  GLUCAP 83 88 79    Medications:  Scheduled:  . antiseptic oral rinse  15 mL Mouth Rinse Q4H  . chlorhexidine  15 mL Mouth/Throat BID   . heparin subcutaneous  5,000 Units Subcutaneous Q8H  . insulin aspart  1-3 Units Subcutaneous Q4H  . micafungin (MYCAMINE) IV  100 mg Intravenous Q24H  . pantoprazole (PROTONIX) IV  40 mg Intravenous Q12H  . piperacillin-tazobactam (ZOSYN)  IV  3.375 g Intravenous Q8H  . potassium phosphate IVPB (mmol)  20 mmol Intravenous Once  . vancomycin  1,000 mg Intravenous Q12H    Insulin Requirements in the past 24 hours:  6 units Novolog ICU-SSI and 15 units/L in TPN, cbgs 79-131  Current Nutrition:  NPO   Assessment:  48 y.o. male presented to ED  1/8 with abd pain. CT consistent with perforated stomach ulcer due to NSAID use; pneumoperitoneum; peritonitis. Taken emergently to OR where he underwent exp lap, debridement of necrotic stomach, repair of perforated stomach. Pt must be strict NPO until contrast study can be done to eval gastric repair which will be several days from now. TPN to be started in the interim.   GI: NPO.  Hypoactive BS, wounds OK  Prealb 4.7  Endo: No h/o DM. CBGs 79-131  Lytes: Na 143, K 3.7, Phos 2.2  Renal: Cr 1.34- increasing. UOP 1.3 ml/Kg/hr  Pulm: VDRF since surgery. FiO2 40%   Cards: Off pressors Inc Trop- NSTEMI vs demand ischemia. Back in NSR, amio off   Hepatobil: AST 24, TG 250  Neuro: Sedated on fentanyl and versed. RASS goal -2.   ID: Vanc/Zosyn/Micafungin D#5 for perf stomach with necrosis. Serosanguinous fluid from drains.  WBC wnl,Tmax 101.1   Best Practices: Enox, PPI IV, MC  TPN Access: CVC 08/29/13  TPN day#: 4  Nutritional Goals:  Goal 2300-2400kcal and 150-160gm protein per RD recommendations.  Unlikely to meet protein requirements with premixed clinimix formula  Plan:  1. Inc Clinimix E 5/15 to 100 ml/hr with lipids at 8110ml/hr 2.  KPhos bolus 20mmol IV X 1 3. Change ICU-SSI q6 4. Cont Insulin at 15units/L    Talbert CageLora Aveer Bartow PharmD Clinical Pharmacist [facility]

## 2013-09-02 NOTE — Progress Notes (Signed)
eLink Physician-Brief Progress Note Patient Name: James LeydenChristopher D Mccormick DOB: 12/14/1965 MRN: [id number]  Date of Service  09/02/2013   HPI/Events of Note   Notified by RN that HITT panel was ordered today given serial drop in platelets.  I will temporarily d/c subcutaneous heparin pending that result. SCDs are in place  eICU Interventions     Intervention Category Minor Interventions: Routine modifications to care plan (e.g. PRN medications for pain, fever)  James Mccormick S. 09/02/2013, 9:43 PM

## 2013-09-02 NOTE — Progress Notes (Addendum)
Dr Marchelle Gearingamaswamy updated pt status. Updated precedex gtt initiated per AM order. Pt RASS labile, range from -2 to +1. Updated when pt resting more comfortably and not agitated, BP decreased to sbp 70-80s, with decreased BP, IV sedation (precedex and fentanyl) decreased and pt becomes restless/agitated, RR>32, attempting to remove ETT. MD ok to use wrist restraints. MD updated lasix given as ordered this AM, updated on output thus far. MD stated to continue precedex gtt at low dose and increase fentanyl as needed to keep RASS -1/-2. MD gave orders for neosynephrine gtt as needed for MAP>65. Will continue to monitor. Koren BoundWaggoner, Tegan Britain

## 2013-09-02 NOTE — Progress Notes (Addendum)
NUTRITION FOLLOW UP  Intervention:    TPN per pharmacy RD to follow for nutrition care plan  Nutrition Dx:   Inadequate oral intake related to inability to eat as evidenced by NPO status, ongoing  Goal:   TPN to meet > 90% of estimated nutrition needs, progressing  Monitor:   TPN prescription, respiratory status, weight, labs, I/O's  Assessment:   Patient with PMH of GERD, HTN presented with severe abdominal pain associated with nausea & vomiting; had been taking Alleve and large amount of Goody's powder.  CT scan revealed perforated stomach ulcer due to NSAID use.   Patient s/p procedures 1/8:  EXPLORATORY LAPAROTOMY  DEBRIDEMENT OF NECROTIC STOMACH  REPAIR OF PERFORATED STOMACH  Patient is currently intubated on ventilator support -- NGT to LIS MV: 14.1 L/min Temp (24hrs), Avg:100.2 F (37.9 C), Min:99.2 F (37.3 C), Max:101.5 F (38.6 C)   Patient off pressors.  Trying to wean off ventilator.  Plan is for contrast study to evaluate gastric repair 1/15.  Patient to receive TPN with Clinimix E 5/15 @ 100 ml/hr and lipids @ 10 ml/hr. Provides 2184 kcal, 120 gm protein daily. Meets 87% minimum estimated energy needs and 80% minimum estimated protein needs.  Of note, unlikely to meet estimated protein needs with premixed Clinimix formula.  Height: Ht Readings from Last 1 Encounters:  08/28/13 5\' 11"  (1.803 m)    Weight Status:   Wt Readings from Last 1 Encounters:  09/01/13 262 lb 5.6 oz (119 kg)    Body mass index is 36.61 kg/(m^2).  Re-estimated needs:  Kcal: 2500 Protein: 150-160 gm Fluid: per MD  Skin: abdominal surgical incision   Diet Order: NPO   Intake/Output Summary (Last 24 hours) at 09/02/13 1309 Last data filed at 09/02/13 1100  Gross per 24 hour  Intake 5527.52 ml  Output   2815 ml  Net 2712.52 ml    Labs:   Recent Labs Lab 08/31/13 0330  08/31/13 1824 09/01/13 0525 09/02/13 0425  NA 133*  < > 129* 136* 143  K 3.6*  < > 3.9  3.5* 3.7  CL 96  < > 94* 101 108  CO2 23  < > 22 24 26   BUN 39*  < > 37* 32* 26*  CREATININE 1.73*  < > 1.40* 1.29 1.34  CALCIUM 6.3*  < > 6.5* 7.0* 7.1*  MG 1.6  --  1.7 2.0  --   PHOS 3.4  --  2.4 1.9* 2.2*  GLUCOSE 147*  < > 120* 139* 97  < > = values in this interval not displayed.  CBG (last 3)   Recent Labs  09/01/13 2333 09/02/13 0352 09/02/13 0710  GLUCAP 88 79 92    Scheduled Meds: . antiseptic oral rinse  15 mL Mouth Rinse Q4H  . chlorhexidine  15 mL Mouth/Throat BID  . furosemide  40 mg Intravenous Q8H  . heparin subcutaneous  5,000 Units Subcutaneous Q8H  . insulin aspart  1-3 Units Subcutaneous Q6H  . micafungin (MYCAMINE) IV  100 mg Intravenous Q24H  . pantoprazole (PROTONIX) IV  40 mg Intravenous Q12H  . piperacillin-tazobactam (ZOSYN)  IV  3.375 g Intravenous Q8H  . potassium phosphate IVPB (mmol)  20 mmol Intravenous Once  . vancomycin  1,000 mg Intravenous Q12H    Continuous Infusions: . sodium chloride 20 mL/hr at 09/02/13 1000  . dexmedetomidine 0.5 mcg/kg/hr (09/02/13 1308)  . Marland Kitchen.TPN (CLINIMIX-E) Adult 80 mL/hr at 09/02/13 0800   And  . fat emulsion  480 kcal (09/02/13 0800)  . Marland KitchenTPN (CLINIMIX-E) Adult     And  . fat emulsion    . fentaNYL infusion INTRAVENOUS 125 mcg/hr (09/02/13 1100)  . midazolam (VERSED) infusion Stopped (09/01/13 1107)  . phenylephrine (NEO-SYNEPHRINE) Adult infusion Stopped (08/31/13 1400)  . vasopressin (PITRESSIN) infusion - *FOR SHOCK* 0.03 Units/min (08/31/13 0458)    Maureen Chatters, RD, LDN Pager #: [phone] After-Hours Pager #: [phone]

## 2013-09-02 NOTE — Progress Notes (Signed)
Fentanyl gtt bag changed, 15mL waste with bag change in sink, Kerin RansomBlaire Smith RN witness waste. Koren BoundWaggoner, Keron Neenan

## 2013-09-03 ENCOUNTER — Inpatient Hospital Stay (HOSPITAL_COMMUNITY): Payer: Medicaid Other

## 2013-09-03 DIAGNOSIS — R9430 Abnormal result of cardiovascular function study, unspecified: Secondary | ICD-10-CM

## 2013-09-03 DIAGNOSIS — I771 Stricture of artery: Secondary | ICD-10-CM

## 2013-09-03 LAB — BASIC METABOLIC PANEL
BUN: 27 mg/dL — ABNORMAL HIGH (ref 6–23)
CALCIUM: 7.6 mg/dL — AB (ref 8.4–10.5)
CO2: 24 meq/L (ref 19–32)
Chloride: 102 mEq/L (ref 96–112)
Creatinine, Ser: 1.41 mg/dL — ABNORMAL HIGH (ref 0.50–1.35)
GFR calc Af Amer: 66 mL/min — ABNORMAL LOW (ref >90)
GFR, EST NON AFRICAN AMERICAN: 57 mL/min — AB (ref >90)
GLUCOSE: 123 mg/dL — AB (ref 70–99)
Potassium: 3.3 mEq/L — ABNORMAL LOW (ref 3.7–5.3)
SODIUM: 139 meq/L (ref 137–147)

## 2013-09-03 LAB — GLUCOSE, CAPILLARY
GLUCOSE-CAPILLARY: 120 mg/dL — AB (ref 70–99)
Glucose-Capillary: 108 mg/dL — ABNORMAL HIGH (ref 70–99)
Glucose-Capillary: 116 mg/dL — ABNORMAL HIGH (ref 70–99)

## 2013-09-03 LAB — POCT I-STAT 3, ART BLOOD GAS (G3+)
ACID-BASE DEFICIT: 2 mmol/L (ref 0.0–2.0)
ACID-BASE EXCESS: 1 mmol/L (ref 0.0–2.0)
Bicarbonate: 26 mEq/L — ABNORMAL HIGH (ref 20.0–24.0)
Bicarbonate: 25.1 mEq/L — ABNORMAL HIGH (ref 20.0–24.0)
O2 SAT: 84 %
O2 SAT: 80 %
PO2 ART: 48 mmHg — AB (ref 80.0–100.0)
Patient temperature: 98.3
Patient temperature: 98.5
TCO2: 27 mmol/L (ref 0–100)
TCO2: 27 mmol/L (ref 0–100)
pCO2 arterial: 42.4 mmHg (ref 35.0–45.0)
pCO2 arterial: 49.3 mmHg — ABNORMAL HIGH (ref 35.0–45.0)
pH, Arterial: 7.395 (ref 7.350–7.450)
pH, Arterial: 7.314 — ABNORMAL LOW (ref 7.350–7.450)
pO2, Arterial: 49 mmHg — ABNORMAL LOW (ref 80.0–100.0)

## 2013-09-03 LAB — CBC WITH DIFFERENTIAL/PLATELET
BASOS PCT: 2 % — AB (ref 0–1)
Basophils Absolute: 0.2 10*3/uL — ABNORMAL HIGH (ref 0.0–0.1)
EOS ABS: 0.3 10*3/uL (ref 0.0–0.7)
Eosinophils Relative: 3 % (ref 0–5)
HEMATOCRIT: 35.3 % — AB (ref 39.0–52.0)
HEMOGLOBIN: 11.9 g/dL — AB (ref 13.0–17.0)
Lymphocytes Relative: 14 % (ref 12–46)
Lymphs Abs: 1.4 10*3/uL (ref 0.7–4.0)
MCH: 30.7 pg (ref 26.0–34.0)
MCHC: 33.7 g/dL (ref 30.0–36.0)
MCV: 91.2 fL (ref 78.0–100.0)
MONOS PCT: 6 % (ref 3–12)
Monocytes Absolute: 0.6 10*3/uL (ref 0.1–1.0)
NEUTROS ABS: 7.4 10*3/uL (ref 1.7–7.7)
NEUTROS PCT: 75 % (ref 43–77)
Platelets: 121 10*3/uL — ABNORMAL LOW (ref 150–400)
RBC: 3.87 MIL/uL — AB (ref 4.22–5.81)
RDW: 15 % (ref 11.5–15.5)
WBC Morphology: INCREASED
WBC: 9.9 10*3/uL (ref 4.0–10.5)

## 2013-09-03 LAB — PATHOLOGIST SMEAR REVIEW

## 2013-09-03 LAB — PROCALCITONIN: Procalcitonin: 24.32 ng/mL

## 2013-09-03 LAB — PHOSPHORUS: PHOSPHORUS: 2.5 mg/dL (ref 2.3–4.6)

## 2013-09-03 LAB — TROPONIN I: Troponin I: <0.3 ng/mL (ref <0.30)

## 2013-09-03 LAB — LACTIC ACID, PLASMA: LACTIC ACID, VENOUS: 1.6 mmol/L (ref 0.5–2.2)

## 2013-09-03 LAB — PRO B NATRIURETIC PEPTIDE: Pro B Natriuretic peptide (BNP): 358.1 pg/mL — ABNORMAL HIGH (ref 0–125)

## 2013-09-03 LAB — MAGNESIUM: Magnesium: 1.8 mg/dL (ref 1.5–2.5)

## 2013-09-03 MED ORDER — SODIUM CHLORIDE 0.9 % IJ SOLN
3.0000 mL | INTRAMUSCULAR | Status: DC | PRN
  Administered 2013-09-03: 3 mL via INTRAVENOUS

## 2013-09-03 MED ORDER — PHENYLEPHRINE HCL 10 MG/ML IJ SOLN
30.0000 ug/min | INTRAVENOUS | Status: DC
  Administered 2013-09-03: 40 ug/min via INTRAVENOUS
  Administered 2013-09-04: 100 ug/min via INTRAVENOUS
  Administered 2013-09-05 – 2013-09-06 (×3): 50 ug/min via INTRAVENOUS
  Filled 2013-09-03 (×6): qty 4

## 2013-09-03 MED ORDER — MAGNESIUM SULFATE 40 MG/ML IJ SOLN
2.0000 g | Freq: Once | INTRAMUSCULAR | Status: AC
  Administered 2013-09-03: 2 g via INTRAVENOUS
  Filled 2013-09-03: qty 50

## 2013-09-03 MED ORDER — FUROSEMIDE 10 MG/ML IJ SOLN
40.0000 mg | Freq: Two times a day (BID) | INTRAMUSCULAR | Status: DC
  Administered 2013-09-03 – 2013-09-05 (×5): 40 mg via INTRAVENOUS
  Filled 2013-09-03 (×9): qty 4

## 2013-09-03 MED ORDER — FAT EMULSION 20 % IV EMUL
240.0000 mL | INTRAVENOUS | Status: AC
  Administered 2013-09-03: 240 mL via INTRAVENOUS
  Filled 2013-09-03: qty 250

## 2013-09-03 MED ORDER — BIOTENE DRY MOUTH MT LIQD
15.0000 mL | Freq: Four times a day (QID) | OROMUCOSAL | Status: DC
  Administered 2013-09-03 – 2013-09-13 (×40): 15 mL via OROMUCOSAL

## 2013-09-03 MED ORDER — SODIUM CHLORIDE 0.9 % IV SOLN
0.0000 mg/h | INTRAVENOUS | Status: DC
  Administered 2013-09-03: 5 mg/h via INTRAVENOUS
  Administered 2013-09-03: 3 mg/h via INTRAVENOUS
  Administered 2013-09-04: 5 mg/h via INTRAVENOUS
  Administered 2013-09-05 (×2): 4 mg/h via INTRAVENOUS
  Administered 2013-09-06: 6 mg/h via INTRAVENOUS
  Administered 2013-09-06 (×2): 5 mg/h via INTRAVENOUS
  Administered 2013-09-07 (×2): 8 mg/h via INTRAVENOUS
  Administered 2013-09-07: 7 mg/h via INTRAVENOUS
  Filled 2013-09-03 (×13): qty 10

## 2013-09-03 MED ORDER — PANTOPRAZOLE SODIUM 40 MG IV SOLR
40.0000 mg | Freq: Every day | INTRAVENOUS | Status: DC
  Filled 2013-09-03: qty 40

## 2013-09-03 MED ORDER — SODIUM CHLORIDE 0.9 % IV SOLN
3.0000 ug/kg/min | INTRAVENOUS | Status: DC
  Administered 2013-09-03: 3 ug/kg/min via INTRAVENOUS
  Administered 2013-09-04 (×2): 3.5 ug/kg/min via INTRAVENOUS
  Administered 2013-09-05: 4 ug/kg/min via INTRAVENOUS
  Administered 2013-09-05: 3.5 ug/kg/min via INTRAVENOUS
  Administered 2013-09-05: 4 ug/kg/min via INTRAVENOUS
  Administered 2013-09-06: 6 ug/kg/min via INTRAVENOUS
  Administered 2013-09-06: 5.504 ug/kg/min via INTRAVENOUS
  Filled 2013-09-03 (×10): qty 20

## 2013-09-03 MED ORDER — PANTOPRAZOLE SODIUM 40 MG IV SOLR
40.0000 mg | Freq: Two times a day (BID) | INTRAVENOUS | Status: DC
  Administered 2013-09-03 – 2013-10-14 (×83): 40 mg via INTRAVENOUS
  Filled 2013-09-03 (×93): qty 40

## 2013-09-03 MED ORDER — TRACE MINERALS CR-CU-F-FE-I-MN-MO-SE-ZN IV SOLN
INTRAVENOUS | Status: AC
  Administered 2013-09-03: 18:00:00 via INTRAVENOUS
  Filled 2013-09-03: qty 2400

## 2013-09-03 MED ORDER — ARTIFICIAL TEARS OP OINT
1.0000 "application " | TOPICAL_OINTMENT | Freq: Three times a day (TID) | OPHTHALMIC | Status: DC
  Administered 2013-09-03 – 2013-09-07 (×14): 1 via OPHTHALMIC
  Filled 2013-09-03 (×3): qty 3.5

## 2013-09-03 MED ORDER — MIDAZOLAM BOLUS VIA INFUSION
1.0000 mg | INTRAVENOUS | Status: DC | PRN
  Administered 2013-09-03: 2 mg via INTRAVENOUS
  Administered 2013-09-04 – 2013-09-07 (×3): 1 mg via INTRAVENOUS
  Administered 2013-09-08 (×2): 2 mg via INTRAVENOUS
  Filled 2013-09-03 (×2): qty 2

## 2013-09-03 MED ORDER — POTASSIUM CHLORIDE 10 MEQ/50ML IV SOLN
10.0000 meq | INTRAVENOUS | Status: AC
  Administered 2013-09-03 (×3): 10 meq via INTRAVENOUS
  Filled 2013-09-03 (×3): qty 50

## 2013-09-03 MED ORDER — CHLORHEXIDINE GLUCONATE 0.12 % MT SOLN: 15.0000 mL | Freq: Two times a day (BID) | OROMUCOSAL | Status: DC

## 2013-09-03 MED ORDER — IMIPENEM-CILASTATIN 500 MG IV SOLR
500.0000 mg | Freq: Four times a day (QID) | INTRAVENOUS | Status: DC
  Administered 2013-09-03 – 2013-09-06 (×12): 500 mg via INTRAVENOUS
  Filled 2013-09-03 (×14): qty 500

## 2013-09-03 MED ORDER — DEXMEDETOMIDINE HCL IN NACL 400 MCG/100ML IV SOLN
0.4000 ug/kg/h | INTRAVENOUS | Status: DC
  Administered 2013-09-03 (×3): 1.5 ug/kg/h via INTRAVENOUS
  Filled 2013-09-03 (×4): qty 100

## 2013-09-03 MED ORDER — CISATRACURIUM BOLUS VIA INFUSION
0.1000 mg/kg | Freq: Once | INTRAVENOUS | Status: AC
  Administered 2013-09-03: 11.9 mg via INTRAVENOUS
  Filled 2013-09-03: qty 12

## 2013-09-03 NOTE — Progress Notes (Signed)
VASCULAR LAB PRELIMINARY   Left upper extremity venous duplex has been completed. No evidence of deep or superficial thrombosis of the left arm. Also, No thrombus visualized in superficial branches at discolored area of forearm.  Left upper extremity arterial duplex has been completed. Triphasic flow is noted in the left subclavian, axillary, brachial, and ulnar arteries. Very minimal flow is noted in the left radial artery with a spiky waveform, consistent with near occlusion. There appears to be homogenous material in the radial artery from mid forearm to wrist, which is possibly thrombus.   Farrel DemarkJill Eunice, RDMS, RVT 09/03/2013

## 2013-09-03 NOTE — Consult Note (Signed)
[facility] [place]  Referred by:  PCCM  Reason for referral: left radial arterial occlusion  History of Present Illness  James Mccormick is a 48 y.o. (Jun 16, 1966) male who presents with chief complaint: possible left radial artery occlusion.  Patient is currently intubated and sedated after a debridement of necrotic stomach and primary repair of perforated stomach (08/28/13), so history is obtained from chart.  By report, the patient initially had an IV and radial A-line in the left arm.   The patient does not have any prior history of atherosclerosis or embolic disease.  His risk factor for atherosclerosis include: HTN, active smoking.  Past Medical History  Diagnosis Date  . GERD (gastroesophageal reflux disease)   . Hypertension    Past Surgical History  Procedure Laterality Date  . Neck surgery    . Laparotomy N/A 08/28/2013    Procedure: EXPLORATORY LAPAROTOMY,debridment of nacrotic stomach,and primary repair of perforated stomach.;  Surgeon: Atilano Ina, MD;  Location: [facility] OR;  Service: General;  Laterality: N/A;   History   Social History  . Marital Status: Divorced    Spouse Name: N/A    Number of Children: N/A  . Years of Education: N/A   Occupational History  . Not on file.   Social History Main Topics  . Smoking status: Current Every Day Smoker -- 1.00 packs/day    Types: Cigarettes  . Smokeless tobacco: Not on file  . Alcohol Use: No  . Drug Use: No  . Sexual Activity: Not on file   Other Topics Concern  . Not on file   Social History Narrative  . No narrative on file    Family history cannot be obtain due to intubation and sedation  Current Facility-Administered Medications  Medication Dose Route Frequency Provider Last Rate Last Dose  . 0.9 %  sodium chloride infusion   Intravenous Continuous Kalman Shan, MD 20 mL/hr at 09/02/13 1000    . acetaminophen (TYLENOL) suppository 650 mg  650 mg Rectal Q6H PRN Oretha Milch, MD    650 mg at 09/03/13 0932  . albuterol (PROVENTIL) (2.5 MG/3ML) 0.083% nebulizer solution 2.5 mg  2.5 mg Nebulization Q2H PRN Coralyn Helling, MD      . antiseptic oral rinse (BIOTENE) solution 15 mL  15 mL Mouth Rinse QID Kalman Shan, MD   15 mL at 09/03/13 1535  . artificial tears (LACRILUBE) ophthalmic ointment 1 application  1 application Both Eyes Q8H Kalman Shan, MD   1 application at 09/03/13 1535  . chlorhexidine (PERIDEX) 0.12 % solution 15 mL  15 mL Mouth/Throat BID Lonia Farber, MD   15 mL at 09/03/13 1040  . cisatracurium (NIMBEX) 200 mg in sodium chloride 0.9 % 200 mL infusion  3-10 mcg/kg/min Intravenous Titrated Kalman Shan, MD 21.4 mL/hr at 09/03/13 1600 2.997 mcg/kg/min at 09/03/13 1600  . TPN (CLINIMIX-E) Adult   Intravenous Continuous TPN Lora Poteet Seay, RPH 100 mL/hr at 09/03/13 1600     And  . fat emulsion 20 % infusion 240 mL  240 mL Intravenous Continuous TPN Lora Poteet Seay, RPH 10 mL/hr at 09/03/13 1600 240 mL at 09/03/13 1600  . TPN (CLINIMIX-E) Adult   Intravenous Continuous TPN Lora Poteet Seay, RPH       And  . fat emulsion 20 % infusion 240 mL  240 mL Intravenous Continuous TPN Lora Poteet Seay, RPH      . fentaNYL (SUBLIMAZE) 10 mcg/mL in sodium chloride 0.9 % 250 mL  infusion  25-400 mcg/hr Intravenous Continuous Lonia Farber, MD 40 mL/hr at 09/03/13 1600 400 mcg/hr at 09/03/13 1600  . furosemide (LASIX) injection 40 mg  40 mg Intravenous Q12H Kalman Shan, MD      . imipenem-cilastatin (PRIMAXIN) 500 mg in sodium chloride 0.9 % 100 mL IVPB  500 mg Intravenous Q6H Drake Leach Rumbarger, RPH   500 mg at 09/03/13 1046  . influenza vac split quadrivalent PF (FLUARIX) injection 0.5 mL  0.5 mL Intramuscular Prior to discharge Kalman Shan, MD      . insulin aspart (novoLOG) injection 1-3 Units  1-3 Units Subcutaneous Q6H Lora Poteet Seay, RPH      . micafungin (MYCAMINE) 100 mg in sodium chloride 0.9 % 100 mL IVPB  100 mg  Intravenous Q24H Jenelle Mages, MD   100 mg at 09/02/13 2122  . midazolam (VERSED) 1 mg/mL in sodium chloride 0.9 % 50 mL infusion  0-10 mg/hr Intravenous Continuous Kalman Shan, MD 5 mL/hr at 09/03/13 1600 5 mg/hr at 09/03/13 1600  . midazolam (VERSED) bolus via infusion 1-2 mg  1-2 mg Intravenous Q2H PRN Kalman Shan, MD   2 mg at 09/03/13 1135  . pantoprazole (PROTONIX) injection 40 mg  40 mg Intravenous Q12H Benny Lennert, RPH   40 mg at 09/03/13 1037  . phenylephrine (NEO-SYNEPHRINE) 10 mg in dextrose 5 % 250 mL infusion  30-200 mcg/min Intravenous Titrated Kalman Shan, MD 60 mL/hr at 09/03/13 1600 40 mcg/min at 09/03/13 1600  . pneumococcal 23 valent vaccine (PNU-IMMUNE) injection 0.5 mL  0.5 mL Intramuscular Prior to discharge Kalman Shan, MD      . sodium chloride 0.9 % injection 3 mL  3 mL Intravenous PRN Kalman Shan, MD   3 mL at 09/03/13 1202  . vancomycin (VANCOCIN) IVPB 1000 mg/200 mL premix  1,000 mg Intravenous Q12H Hessie Diener Garceno, RPH   1,000 mg at 09/03/13 1254   No Known Allergies   REVIEW OF SYSTEMS:  (Positives checked otherwise negative)  CARDIOVASCULAR:  []  chest pain, []  chest pressure, []  palpitations, []  shortness of breath when laying flat, [x]  shortness of breath with exertion,  []  pain in feet when walking, []  pain in feet when laying flat, []  history of blood clot in veins (DVT), []  history of phlebitis, []  swelling in legs, []  varicose veins  PULMONARY:  []  productive cough, []  asthma, []  wheezing  NEUROLOGIC:  []  weakness in arms or legs, []  numbness in arms or legs, []  difficulty speaking or slurred speech, []  temporary loss of vision in one eye, []  dizziness  HEMATOLOGIC:  []  bleeding problems, []  problems with blood clotting too easily  MUSCULOSKEL:  []  joint pain, []  joint swelling  GASTROINTEST:  []  vomiting blood, []  blood in stool     GENITOURINARY:  []  burning with urination, []  blood in urine, [x]  nausea and  vomiting, [x]  abdominal pain  PSYCHIATRIC:  []  history of major depression  INTEGUMENTARY:  []  rashes, []  ulcers  CONSTITUTIONAL:  [x]  fever, [x]  chills  Physical Examination  Filed Vitals:   09/03/13 1530 09/03/13 1545 09/03/13 1600 09/03/13 1615  BP: 87/52 81/43 83/48  87/50  Pulse: 81 82 82 81  Temp:   99.4 F (37.4 C)   TempSrc:   Rectal   Resp: 34 34 34 34  Height:      Weight:      SpO2: 91% 92% 92% 92%    Body mass index is 36.61 kg/(m^2).  General: A&O x 3, WD,  obese, edematous  Head: [place]/AT  Ear/Nose/Throat: intubated, oropharynx cannot be evaluated, no drainage from nares, hearing cannot be tested  Eyes: PERRL, EOMI cannot be tested  Neck: Supple, no nuchal rigidity, no palpable LAD  Pulmonary: Sym exp, good air movt, no rhonchi, & wheezing, mechanical vent. Sounds with rales, on vent. Vent Mode:  [-] PRVC FiO2 (%):  [40 %-60 %] 60 % Set Rate:  [24 bmp-34 bmp] 34 bmp Vt Set:  [450 mL-530 mL] 450 mL PEEP:  [5 cmH20-10 cmH20] 10 cmH20 Plateau Pressure:  [24 cmH20-35 cmH20] 35 cmH20  ABG    Component Value Date/Time   PHART 7.314* 09/03/2013 1513   PCO2ART 49.3* 09/03/2013 1513   PO2ART 48.0* 09/03/2013 1513   HCO3 25.1* 09/03/2013 1513   TCO2 27 09/03/2013 1513   ACIDBASEDEF 2.0 09/03/2013 1513   O2SAT 80.0 09/03/2013 1513     Cardiac: RRR, Nl S1, S2, no rubs or gallops, faint murmur, Neo drip @ 40 mcg/min  Vascular: Vessel Right Left  Radial Palpable Not Palpable  Ulnar Not Palpable Not Palpable  Brachial Faintly Palpable Faintly Palpable  Carotid Palpable, without bruit Palpable, without bruit  Aorta Not palpable N/A  Femoral Palpable Palpable  Popliteal Not palpable Not palpable  PT Not Palpable Not Palpable  DP Not Palpable Not Palpable   Gastrointestinal: soft, no grimace to palpation, -G/R, - HSM, - masses, - CVAT, open incision line, bandage in place  Musculoskeletal: cannot be tested due to sedation, left forearm with bullae on volar  surface, multiphasic dopplerable ulnar artery, no palmar signal, thickened palm, Cap refill < 2 sec, 2+ edema throughout  Neurologic: cannot be tested due to sedation, Fentanyl drip @ 400 mcg/hr, Versed 5 mg/hr  Psychiatric: cannot be tested due to sedation,   Dermatologic: See M/S exam for extremity exam, no rashes otherwise noted  Lymph : No Cervical, Axillary, or Inguinal lymphadenopathy   Laboratory: CBC:    Component Value Date/Time   WBC 9.9 09/03/2013 0420   RBC 3.87* 09/03/2013 0420   HGB 11.9* 09/03/2013 0420   HCT 35.3* 09/03/2013 0420   PLT 121* 09/03/2013 0420   MCV 91.2 09/03/2013 0420   MCH 30.7 09/03/2013 0420   MCHC 33.7 09/03/2013 0420   RDW 15.0 09/03/2013 0420   LYMPHSABS 1.4 09/03/2013 0420   MONOABS 0.6 09/03/2013 0420   EOSABS 0.3 09/03/2013 0420   BASOSABS 0.2* 09/03/2013 0420    BMP:    Component Value Date/Time   NA 139 09/03/2013 0420   K 3.3* 09/03/2013 0420   CL 102 09/03/2013 0420   CO2 24 09/03/2013 0420   GLUCOSE 123* 09/03/2013 0420   BUN 27* 09/03/2013 0420   CREATININE 1.41* 09/03/2013 0420   CALCIUM 7.6* 09/03/2013 0420   GFRNONAA 57* 09/03/2013 0420   GFRAA 66* 09/03/2013 0420    Coagulation: No results found for this basename: INR, PROTIME   No results found for this basename: PTT    Radiology: Dg Chest Port 1 View  09/03/2013   CLINICAL DATA:  Check endotracheal tube.  EXAM: PORTABLE CHEST - 1 VIEW  COMPARISON:  Chest x-ray from yesterday.  FINDINGS: Endotracheal tube ends in the mid thoracic trachea. Enteric tube reaches the stomach. Stable positioning of right IJ catheter.  Diffuse worsening of aeration, with extensive airspace disease. Low volume lungs with haziness of the lower chest likely reflecting pleural fluid.  IMPRESSION: 1. Significant worsening aeration from yesterday, favor alveolar pulmonary edema (which could be cardiogenic or noncardiogenic). 2. Probable  bilateral pleural effusions. 3. Good positioning of tubes and lines.    Electronically Signed   By: Tiburcio PeaJonathan  Watts M.D.   On: 09/03/2013 06:52   Dg Chest Port 1 View  09/02/2013   CLINICAL DATA:  ETT placement.  EXAM: PORTABLE CHEST - 1 VIEW  COMPARISON:  09/01/2013  FINDINGS: Endotracheal tube is present with tip approximately 4 cm above the carina. Right IJ central venous catheter is unchanged. Nasogastric tube courses off the inferior portion of the film and tip is not visualized. Lungs are somewhat hypoinflated with slight worsening hazy opacification over the mid to lower lungs likely moderate size effusions with associated atelectasis, although cannot exclude infection or a component of interstitial edema. There is mild stable cardiomegaly. Remainder of the exam is unchanged.  IMPRESSION: Slight worsening hazy opacification over the mid to lower lungs likely moderate size effusions with associated atelectasis. Cannot exclude infection or a component of interstitial edema.  Tubes and lines as described.   Electronically Signed   By: Elberta Fortisaniel  Boyle M.D.   On: 09/02/2013 08:20   Non-invasive vascular studies (Read by myself) LUE Venous duplex (09/03/2013): no SVT or DVT  LUE arterial duplex (09/03/2013): triphasic flow through left arm except distal radial artery which is near occluded.  Medical Decision Making  Pamalee LeydenChristopher D Studstill is a 48 y.o. 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 refill suggests adequate perfusion via ulnar artery.    In this shocky patient, I doubt any value to trying to salvage the radial artery at this point.    I doubt that bullae on the forearm are due to the radial artery sub-total occlusion, rather either infiltration of infused fluid or contact dermatitis  No vascular surgical intervention needed at this point.  Thank you for allowing us to participate in this patient's care.  Leonides SakeBrian Laretta Pyatt,  MD [facility] [place] Office: [phone] Pager: [phone]  09/03/2013, 4:21 PM

## 2013-09-03 NOTE — Progress Notes (Signed)
6 Days Post-Op  Subjective: On vent agitated.   Objective: Vital signs in last 24 hours: Temp:  [99.5 F (37.5 C)-101.9 F (38.8 C)] 101.9 F (38.8 C) (01/14 0829) Pulse Rate:  [72-175] 83 (01/14 0800) Resp:  [0-36] 35 (01/14 0800) BP: (73-120)/(41-101) 94/56 mmHg (01/14 0800) SpO2:  [82 %-100 %] 97 % (01/14 0800) FiO2 (%):  [40 %] 40 % (01/14 0737)    Intake/Output from previous day: 01/13 0701 - 01/14 0700 In: 6056.7 [I.V.:2282.7; NG/GT:90; IV Piggyback:1154; TPN:2530] Out: 4475 [Urine:3955; Emesis/NG output:500; Drains:20] Intake/Output this shift:    Incision/Wound:open and clean. Drains minimal output.  Lab Results:   Recent Labs  09/01/13 0525 09/03/13 0420  WBC 4.5 9.9  HGB 12.0* 11.9*  HCT 35.1* 35.3*  PLT 125* 121*   BMET  Recent Labs  09/02/13 0425 09/03/13 0420  NA 143 139  K 3.7 3.3*  CL 108 102  CO2 26 24  GLUCOSE 97 123*  BUN 26* 27*  CREATININE 1.34 1.41*  CALCIUM 7.1* 7.6*   PT/INR No results found for this basename: LABPROT, INR,  in the last 72 hours ABG  Recent Labs  08/31/13 1819 09/01/13 0430  PHART 7.389 7.331*  HCO3 24.9* 24.4*    Studies/Results: Dg Chest Port 1 View  09/03/2013   CLINICAL DATA:  Check endotracheal tube.  EXAM: PORTABLE CHEST - 1 VIEW  COMPARISON:  Chest x-ray from yesterday.  FINDINGS: Endotracheal tube ends in the mid thoracic trachea. Enteric tube reaches the stomach. Stable positioning of right IJ catheter.  Diffuse worsening of aeration, with extensive airspace disease. Low volume lungs with haziness of the lower chest likely reflecting pleural fluid.  IMPRESSION: 1. Significant worsening aeration from yesterday, favor alveolar pulmonary edema (which could be cardiogenic or noncardiogenic). 2. Probable bilateral pleural effusions. 3. Good positioning of tubes and lines.   Electronically Signed   By: Tiburcio Pea M.D.   On: 09/03/2013 06:52   Dg Chest Port 1 View  09/02/2013   CLINICAL DATA:  ETT  placement.  EXAM: PORTABLE CHEST - 1 VIEW  COMPARISON:  09/01/2013  FINDINGS: Endotracheal tube is present with tip approximately 4 cm above the carina. Right IJ central venous catheter is unchanged. Nasogastric tube courses off the inferior portion of the film and tip is not visualized. Lungs are somewhat hypoinflated with slight worsening hazy opacification over the mid to lower lungs likely moderate size effusions with associated atelectasis, although cannot exclude infection or a component of interstitial edema. There is mild stable cardiomegaly. Remainder of the exam is unchanged.  IMPRESSION: Slight worsening hazy opacification over the mid to lower lungs likely moderate size effusions with associated atelectasis. Cannot exclude infection or a component of interstitial edema.  Tubes and lines as described.   Electronically Signed   By: Elberta Fortis M.D.   On: 09/02/2013 08:20    Anti-infectives: Anti-infectives   Start     Dose/Rate Route Frequency Ordered Stop   09/02/13 1300  vancomycin (VANCOCIN) IVPB 1000 mg/200 mL premix     1,000 mg 200 mL/hr over 60 Minutes Intravenous Every 12 hours 09/01/13 1527     08/30/13 2000  vancomycin (VANCOCIN) IVPB 750 mg/150 ml premix  Status:  Discontinued     750 mg 150 mL/hr over 60 Minutes Intravenous Every 8 hours 08/30/13 1356 09/01/13 1527   08/29/13 2200  micafungin (MYCAMINE) 100 mg in sodium chloride 0.9 % 100 mL IVPB     100 mg 100 mL/hr over 1  Hours Intravenous Every 24 hours 08/29/13 2036     08/29/13 0400  vancomycin (VANCOCIN) IVPB 1000 mg/200 mL premix  Status:  Discontinued     1,000 mg 200 mL/hr over 60 Minutes Intravenous Every 8 hours 08/28/13 2358 08/30/13 1356   08/29/13 0400  piperacillin-tazobactam (ZOSYN) IVPB 3.375 g     3.375 g 12.5 mL/hr over 240 Minutes Intravenous 3 times per day 08/28/13 2358     08/28/13 1845  [MAR Hold]  vancomycin (VANCOCIN) IVPB 1000 mg/200 mL premix     (On MAR Hold since 08/28/13 1957)   1,000  mg 200 mL/hr over 60 Minutes Intravenous  Once 08/28/13 1831 08/28/13 1955   08/28/13 1845  [MAR Hold]  piperacillin-tazobactam (ZOSYN) IVPB 3.375 g     (On MAR Hold since 08/28/13 1957)   3.375 g 100 mL/hr over 30 Minutes Intravenous  Once 08/28/13 1831 08/28/13 2056      Assessment/Plan: s/p Procedure(s): EXPLORATORY LAPAROTOMY,debridment of nacrotic stomach,and primary repair of perforated stomach. (N/A) Fever up and CXR much worse looking. Suspect pulmonary cause but may need CT of abdomen in next 24 hours with contrast down NGT and IV to look at stomach and access for abscess.  Drains show no gastric or bilious drainage. Cont TNA /DVT prevention/ protonix.   LOS: 6 days    James Wrubel A. 09/03/2013

## 2013-09-03 NOTE — Progress Notes (Signed)
Per ABG, changed vent settings= 10 peep.  ARDS protocol now initiated now that aline is in and pt is sedated (per MD plans). RN aware.

## 2013-09-03 NOTE — Progress Notes (Signed)
ANTIBIOTIC CONSULT NOTE - INITIAL  Pharmacy Consult for primaxin Indication: empiric lung and abdominal coverage  No Known Allergies  Patient Measurements: Height: 5\' 11"  (180.3 cm) Weight: 262 lb 5.6 oz (119 kg) IBW/kg (Calculated) : 75.3 Adjusted Body Weight:   Vital Signs: Temp: 102.1 F (38.9 C) (01/14 0900) Temp src: Rectal (01/14 0900) BP: 100/57 mmHg (01/14 0900) Pulse Rate: 84 (01/14 0936) Intake/Output from previous day: 01/13 0701 - 01/14 0700 In: 6056.7 [I.V.:2282.7; NG/GT:90; IV Piggyback:1154; TPN:2530] Out: 4475 [Urine:3955; Emesis/NG output:500; Drains:20] Intake/Output from this shift: Total I/O In: 515.6 [I.V.:215.6; NG/GT:30; IV Piggyback:50; TPN:220] Out: 440 [Urine:440]  Labs:  Recent Labs  09/01/13 0525 09/02/13 0425 09/03/13 0420  WBC 4.5  --  9.9  HGB 12.0*  --  11.9*  PLT 125*  --  121*  CREATININE 1.29 1.34 1.41*   Estimated Creatinine Clearance: 83.2 ml/min (by C-G formula based on Cr of 1.41).  Recent Labs  09/01/13 1130  VANCOTROUGH 22.8*     Microbiology: Recent Results (from the past 720 hour(s))  CULTURE, BLOOD (ROUTINE X 2)     Status: None   Collection Time    08/29/13 12:35 AM      Result Value Range Status   Specimen Description BLOOD RIGHT [place]   Final   Special Requests BOTTLES DRAWN AEROBIC ONLY 8CC   Final   Culture  Setup Time     Final   Value: 08/30/2013 05:14     Performed at [facility]   Culture     Final   Value:        BLOOD CULTURE RECEIVED NO GROWTH TO DATE CULTURE WILL BE HELD FOR 5 DAYS BEFORE ISSUING A FINAL NEGATIVE REPORT     Performed at [facility]   Report Status PENDING   Incomplete  MRSA PCR SCREENING     Status: None   Collection Time    08/29/13  7:55 AM      Result Value Range Status   MRSA by PCR NEGATIVE  NEGATIVE Final   Comment:            The GeneXpert MRSA Assay (FDA     approved for NASAL specimens     only), is one component of a     comprehensive MRSA  colonization     surveillance program. It is not     intended to diagnose MRSA     infection nor to guide or     monitor treatment for     MRSA infections.  CULTURE, BLOOD (ROUTINE X 2)     Status: None   Collection Time    08/29/13 11:19 PM      Result Value Range Status   Specimen Description BLOOD RIGHT FOOT   Final   Special Requests BOTTLES DRAWN AEROBIC ONLY 0.5CC   Final   Culture  Setup Time     Final   Value: 08/30/2013 05:13     Performed at [facility]   Culture     Final   Value:        BLOOD CULTURE RECEIVED NO GROWTH TO DATE CULTURE WILL BE HELD FOR 5 DAYS BEFORE ISSUING A FINAL NEGATIVE REPORT     Performed at [facility]   Report Status PENDING   Incomplete    Medical History: Past Medical History  Diagnosis Date  . GERD (gastroesophageal reflux disease)   . Hypertension     Medications:  Anti-infectives   Start  Dose/Rate Route Frequency Ordered Stop   09/03/13 1100  imipenem-cilastatin (PRIMAXIN) 500 mg in sodium chloride 0.9 % 100 mL IVPB     500 mg 200 mL/hr over 30 Minutes Intravenous Every 6 hours 09/03/13 1007     09/02/13 1300  vancomycin (VANCOCIN) IVPB 1000 mg/200 mL premix     1,000 mg 200 mL/hr over 60 Minutes Intravenous Every 12 hours 09/01/13 1527     08/30/13 2000  vancomycin (VANCOCIN) IVPB 750 mg/150 ml premix  Status:  Discontinued     750 mg 150 mL/hr over 60 Minutes Intravenous Every 8 hours 08/30/13 1356 09/01/13 1527   08/29/13 2200  micafungin (MYCAMINE) 100 mg in sodium chloride 0.9 % 100 mL IVPB     100 mg 100 mL/hr over 1 Hours Intravenous Every 24 hours 08/29/13 2036     08/29/13 0400  vancomycin (VANCOCIN) IVPB 1000 mg/200 mL premix  Status:  Discontinued     1,000 mg 200 mL/hr over 60 Minutes Intravenous Every 8 hours 08/28/13 2358 08/30/13 1356   08/29/13 0400  piperacillin-tazobactam (ZOSYN) IVPB 3.375 g  Status:  Discontinued     3.375 g 12.5 mL/hr over 240 Minutes Intravenous 3 times per day  08/28/13 2358 09/03/13 1002   08/28/13 1845  [MAR Hold]  vancomycin (VANCOCIN) IVPB 1000 mg/200 mL premix     (On MAR Hold since 08/28/13 1957)   1,000 mg 200 mL/hr over 60 Minutes Intravenous  Once 08/28/13 1831 08/28/13 1955   08/28/13 1845  [MAR Hold]  piperacillin-tazobactam (ZOSYN) IVPB 3.375 g     (On MAR Hold since 08/28/13 1957)   3.375 g 100 mL/hr over 30 Minutes Intravenous  Once 08/28/13 1831 08/28/13 2056     Assessment: 47 yom presented to the hospital with abdominal pain. Now s/p debridement of necrotic stomach and repair of stomach perf. He has been on broad-spectrum antibiotics and antifungals since 1/8. Now changing from zosyn to primaxin. Pt is febrile with a Tmax of 101.9 and WBC is WNL. Scr with slight trend up today.   VT (1/10): 22.6 on 1000 mg IV Q 8 hours  VT (1/12): 22.8 on 750mg  IV Q 8 hours  Vanc 1/8>> Zosyn 1/8>>1/14 Micafungin 1/9>> Primaxin 1/14>>  1/9 MRSA - NEG 1/9 Blood>> ngtd  Goal of Therapy:  Eradication of infection  Plan:  1. Primaxin 500mg  IV Q6H 2. F/u renal fxn, C&S, clinical status  3. Continue vancomycin and micafungin as ordered  Adra Shepler, Drake Leachachel Lynn 09/03/2013,10:08 AM

## 2013-09-03 NOTE — Progress Notes (Signed)
[facility] notified of near occlusion in left radial artery.  Vascular to be consulted.  Roselie AwkwardShannon Balian Schaller, RN

## 2013-09-03 NOTE — Progress Notes (Addendum)
Name: James Mccormick MRN: [id number] DOB: 07/08/1966    ADMISSION DATE:  08/28/2013 CONSULTATION DATE:  08/28/13 LOS 6 days   REFERRING MD :  Andrey CampanileWilson PRIMARY SERVICE: CCS  CHIEF COMPLAINT:  Abdominal Pain  BRIEF PATIENT DESCRIPTION:  48 yo male smoker presented with abdominal pain from perforated gastric ulcer in setting of NSAID.  PCCM consulted to assist with septic shock and respiratory failure management.  SIGNIFICANT EVENTS: 1/8 Exploratory laparotomy, debridement of necrotic stomach with scissors, primary repair of perforated stomach in 2 layers.  Septic shock, VDRF, a fib post-op. 1/9 Cardiology consulted . ECHO EF 45% 1/11: still pressor dependent, NSR for > 24 hrs. Amiodarone stopped.  1/12: remain son levophed 09/02/13: off levophed. Encephalopathic - randomly agitated. Mmit 08/28/2013 . TPN continuesoving around, biting tube, looks randomly, does not track. Failed SBT. Febrile. + 24 Liters    STUDIES:  1/8 CT abd/pelvis >> pneumoperitoneum, Lt kidney stone, diverticulosis 1/9 Echo >> mild LVH, EF 40 to 45%, grade 1 diastolic dysfx  LINES / TUBES: 1/8 ETT >>  1/8 Rt IJ CVL >> 1/8 Lt radial aline >>>1/12  CULTURES: Blood 1/9 >>  ANTIBIOTICS: Vancomycin 1/8 >>> Zosyn 1/8 >>>> Micafungin 1/9 >>>>  SUBJECTIVE:   09/03/13: A bit more hypoxemic. Fever continues. CXR significantly worse - ? ARDS On precedex and fentanyl to keep him calm; needing neo. CCS concerned primarily about pumlonary infection but want to do CT abd. Diuresed 2L with lasix but creat going up. Encephalopathy continues  VITAL SIGNS: Temp:  [99.5 F (37.5 C)-102.1 F (38.9 C)] 102.1 F (38.9 C) (01/14 0900) Pulse Rate:  [72-175] 84 (01/14 0936) Resp:  [0-36] 36 (01/14 0936) BP: (73-120)/(41-101) 100/57 mmHg (01/14 0900) SpO2:  [82 %-100 %] 92 % (01/14 0936) FiO2 (%):  [40 %] 40 % (01/14 0800) HEMODYNAMICS: CVP:  [13 mmHg-14 mmHg] 14 mmHg VENTILATOR SETTINGS: Vent Mode:  [-] PRVC FiO2  (%):  [40 %] 40 % Set Rate:  [24 bmp] 24 bmp Vt Set:  [530 mL] 530 mL PEEP:  [5 cmH20] 5 cmH20 Plateau Pressure:  [21 cmH20-31 cmH20] 31 cmH20 INTAKE / OUTPUT: Intake/Output     01/13 0701 - 01/14 0700 01/14 0701 - 01/15 0700   I.V. (mL/kg) 2282.7 (19.2) 215.6 (1.8)   NG/GT 90 30   IV Piggyback 1154 50   TPN 2530 220   Total Intake(mL/kg) 6056.7 (50.9) 515.6 (4.3)   Urine (mL/kg/hr) 3955 (1.4) 440 (1.3)   Emesis/NG output 500 (0.2)    Drains 20 (0)    Total Output 4475 440   Net +1581.7 +75.6          PHYSICAL EXAMINATION: General: no distress, heavily sedated, rass -4 Neuro:  RASS -4 on fent and precedex HEENT: ETT in place Cardiovascular: s1 s2 SR no r/g. On neo due to precedex Lungs: coarse bilat Abdomen: wound dressing clean, drains w/ ser/sang drainage, abd distended, hypoactive   Musculoskeletal: 2+ edema, gen Skin: lower extremities cool  LABS: PULMONARY  Recent Labs Lab 08/30/13 0952 08/30/13 1834 08/31/13 0350 08/31/13 1819 09/01/13 0430  PHART 7.405 7.302* 7.348* 7.389 7.331*  PCO2ART 42.6 53.4* 42.1 41.1 46.1*  PO2ART 73.0* 75.0* 102.0* 73.0* 73.0*  HCO3 26.7* 25.8* 23.1 24.9* 24.4*  TCO2 28 27 24 26 26   O2SAT 95.0 91.0 97.0 95.0 93.0    CBC  Recent Labs Lab 08/31/13 0330 09/01/13 0525 09/03/13 0420  HGB 12.4* 12.0* 11.9*  HCT 35.3* 35.1* 35.3*  WBC 6.7 4.5 9.9  PLT 136* 125* 121*    COAGULATION No results found for this basename: INR,  in the last 168 hours  CARDIAC    Recent Labs Lab 08/29/13 1933 08/30/13 0256 08/30/13 0638 09/03/13 0420  TROPONINI <0.30 <0.30 1.22* <0.30    Recent Labs Lab 09/03/13 0420  PROBNP 358.1*     CHEMISTRY  Recent Labs Lab 08/30/13 0256  08/31/13 0330 08/31/13 1320 08/31/13 1824 09/01/13 0525 09/02/13 0425 09/03/13 0420  NA  --   < > 133* 132* 129* 136* 143 139  K  --   < > 3.6* 3.7 3.9 3.5* 3.7 3.3*  CL  --   < > 96 98 94* 101 108 102  CO2  --   < > 23 21 22 24 26 24   GLUCOSE   --   < > 147* 149* 120* 139* 97 123*  BUN  --   < > 39* 37* 37* 32* 26* 27*  CREATININE  --   < > 1.73* 1.40* 1.40* 1.29 1.34 1.41*  CALCIUM  --   < > 6.3* 6.0* 6.5* 7.0* 7.1* 7.6*  MG 2.0  --  1.6  --  1.7 2.0  --  1.8  PHOS 3.9  --  3.4  --  2.4 1.9* 2.2* 2.5  < > = values in this interval not displayed. Estimated Creatinine Clearance: 83.2 ml/min (by C-G formula based on Cr of 1.41).   LIVER  Recent Labs Lab 08/29/13 0420 08/30/13 0638 08/30/13 1147 08/31/13 0330 09/01/13 0525  AST 56* 79* 86* 97* 54*  ALT 41 41 45 61* 48  ALKPHOS 27* 34* 38* 43 50  BILITOT 0.4 0.3 0.3 0.5 0.7  PROT 4.0* 3.9* 4.1* 4.2* 4.7*  ALBUMIN 2.4* 1.8* 1.9* 1.8* 1.7*     INFECTIOUS  Recent Labs Lab 08/29/13 0857 08/29/13 0930 08/29/13 1821 08/30/13 0258 08/31/13 0330 09/03/13 0421  LATICACIDVEN  --  3.7* 4.1*  --   --  1.6  PROCALCITON 91.57  --   --  180.37 138.17  --      ENDOCRINE CBG (last 3)   Recent Labs  09/02/13 1805 09/03/13 0022 09/03/13 0530  GLUCAP 102* 108* 120*         IMAGING x48h  Dg Chest Port 1 View  09/03/2013   CLINICAL DATA:  Check endotracheal tube.  EXAM: PORTABLE CHEST - 1 VIEW  COMPARISON:  Chest x-ray from yesterday.  FINDINGS: Endotracheal tube ends in the mid thoracic trachea. Enteric tube reaches the stomach. Stable positioning of right IJ catheter.  Diffuse worsening of aeration, with extensive airspace disease. Low volume lungs with haziness of the lower chest likely reflecting pleural fluid.  IMPRESSION: 1. Significant worsening aeration from yesterday, favor alveolar pulmonary edema (which could be cardiogenic or noncardiogenic). 2. Probable bilateral pleural effusions. 3. Good positioning of tubes and lines.   Electronically Signed   By: Tiburcio Pea M.D.   On: 09/03/2013 06:52   Dg Chest Port 1 View  09/02/2013   CLINICAL DATA:  ETT placement.  EXAM: PORTABLE CHEST - 1 VIEW  COMPARISON:  09/01/2013  FINDINGS: Endotracheal tube is present  with tip approximately 4 cm above the carina. Right IJ central venous catheter is unchanged. Nasogastric tube courses off the inferior portion of the film and tip is not visualized. Lungs are somewhat hypoinflated with slight worsening hazy opacification over the mid to lower lungs likely moderate size effusions with associated atelectasis, although cannot exclude infection or a component of interstitial  edema. There is mild stable cardiomegaly. Remainder of the exam is unchanged.  IMPRESSION: Slight worsening hazy opacification over the mid to lower lungs likely moderate size effusions with associated atelectasis. Cannot exclude infection or a component of interstitial edema.  Tubes and lines as described.   Electronically Signed   By: Elberta Fortis M.D.   On: 09/02/2013 08:20       ASSESSMENT / PLAN:  PULMONARY A:  Acute respiratory failure in setting of peritonitis Admissions asp? RLL ATX Bibasilar atx, probable sympathetic effusions 1/11 Hx of smoking >> air trapping on vent 1/10.  09/03/13: ARDS + v Volume overload +/- amio tox  PLAN  -Full vent support - START ARDS PROTOCOL with 48h nimbex 09/03/13  CARDIOVASCULAR A:  Septic shock 2nd to peritonitis. A fib with RVR, resolved, SR for > 24hrs // s/p IV amio 08/29/13 through 08/30/13 Acute systolic heart failure likely 2nd to sepsis. NSTEMI from demand ischemia. Hx of HTN with chronic diastolic dysfx. ECHO 1/19 with systolic CHF  09/02/13: + 24L since admit 08/28/2013. CVP 11. 3rd spacing + 09/03/13: negative 2L since x 24h with lasix  P:  - diurese aggressively -  RENAL A:   Acute kidney injury 2nd to hypovolemia, and ATN in setting of shock. He has had some marginal bump in scr but hoping that this reflects plateau  Metabolic acidosis >> improved.  09/03/13: Mildly worsening creat.   P:   - monitopr creat with lasix  - reduce IV fluids to saline kvo  GASTROINTESTINAL A:   Perforated gastric ulcer in setting of NSAID use  as outpt. Protein calorie malnutrition.  09/03/13: CCS might get CT abd  P:   -post -op care per CCS -protonix BID -continue TNA per CCS - CT abd when stable  HEMATOLOGIC A:   Anemia of critical illness Low platelet ? Due to sepsis/HIT pending 09/02/13 (low risk for HIT)  P:  -f/u CBC in am  -SQ heparin for DVT prevention   INFECTIOUS A:  Septic shock 2nd to peritonitis.  09/02/13: Off pressors x 24h 09/03/13: back on neo with precedex on board. FEbrile  P:   -continue vancomycin,  micafungin with limited pressor imrpovement - Change to zosyn to Imipenem -drain management per surg -no role pct - CT abd when stable per CCS  ENDOCRINE A:   Hyperglycemia. P:   -SSI -at risk burn out adrenal insuff, repeat cortisol in 2 days if not improved   NEUROLOGIC A:   Acute encephalopathy 2nd to septic shock. 09/02/13 and 09/03/13: Off versed. Still enceophalopathic   P:   - deep sedation with fent/versed due top ARDS -goal RASS -4 - cont fent - dc precedex   GLOBAL 09/02/13: Tammy the significant other updated briefly  at bedside with RN present . She did not have any questions.   09/03/13: no family at bedside. Sister discussed in detail over phone. Sister says she will be primary contat. Daughter to be secondary contact. No details other than brief updates to significant other. Explained ARDS. Mom died recently in ICU with similar issues so sister familiar with concepts of ICU. Says she is realistic that this will take weeks or months. Understands acutely uill and high mortality situaton   The patient is critically ill with multiple organ systems failure and requires high complexity decision making for assessment and support, frequent evaluation and titration of therapies, application of advanced monitoring technologies and extensive interpretation of multiple databases.   Critical Care Time devoted to  patient care services described in this note is  45   Minutes.    Dr. Kalman Shan, M.D., [facility].C.P Pulmonary and Critical Care Medicine Staff Physician [facility] Pager: [phone], If no answer or between  15:00h - 7:00h: call [phone]  09/03/2013 9:55 AM

## 2013-09-03 NOTE — Progress Notes (Signed)
This note also relates to the following rows which could not be included: CVP (mmHg) - Cannot attach notes to unvalidated device data   Temp 102.1 rectal on cooling blanket. 0935-acetaminophen 650 mg supp given pr.

## 2013-09-03 NOTE — Procedures (Signed)
Arterial Catheter Insertion Procedure Note James Mccormick [id number] 11/02/1965  Procedure: Insertion of Arterial Catheter  Indications: Blood pressure monitoring  Pt Doppler neg for collateral blood flow right radial, Dr. Marchelle Mccormick notified and aware, per MD it's ok to stick for aline on right radial artery.    Procedure Details Consent: Unable to obtain consent because of emergent medical necessity. pt sedated on vent. Time Out: Verified patient identification, verified procedure, site/side was marked, verified correct patient position, special equipment/implants available, medications/allergies/relevent history reviewed, required imaging and test results available.  Performed  Maximum sterile technique was used including antiseptics, cap, gloves, gown, Chamberlin hygiene, mask and sheet. Skin prep: Chlorhexidine; local anesthetic administered 20 gauge catheter was inserted into right radial artery using the Seldinger technique.  Evaluation Blood flow good; BP tracing good. Complications: No apparent complications.     James Mccormick, James Mccormick 09/03/2013

## 2013-09-03 NOTE — Progress Notes (Signed)
Upon assessment, pt has a long purplish-gray wound on left forearm with a fluid filled blister at lower end of wound.  Pt had sodium bicarb infusing in left [place] PIV last week and left radial arterial line.  Dr. Marchelle Gearingamaswamy notified, Dirk DressKaty Whiteheart NP came to bedside to assess.  Orders received for dopplars of left arm.  Pt does have weak radial pulses bilaterally.  IV team RN also to bedside to assess.  Pharmacy called for possible sodium bicarb extravasation.  Recommendation for warm therapy to arm.  Warm pack placed to forearm.     Roselie AwkwardShannon Delane Wessinger, RN

## 2013-09-03 NOTE — Progress Notes (Signed)
PARENTERAL NUTRITION CONSULT NOTE - FOLLOW UP  Pharmacy Consult for TPN Indication: Prolonged ileus  No Known Allergies  Patient Measurements: Height: 5\' 11"  (180.3 cm) Weight: 262 lb 5.6 oz (119 kg) IBW/kg (Calculated) : 75.3 Adjusted Body Weight:  Usual Weight:   Vital Signs: Temp: 101.5 F (38.6 C) (01/14 0700) Temp src: Rectal (01/14 0700) BP: 96/59 mmHg (01/14 0737) Pulse Rate: 83 (01/14 0737) Intake/Output from previous day: 01/13 0701 - 01/14 0700 In: 6056.7 [I.V.:2282.7; NG/GT:90; IV Piggyback:1154; TPN:2530] Out: 4475 [Urine:3955; Emesis/NG output:500; Drains:20] Intake/Output from this shift:    Labs:  Recent Labs  09/01/13 0525 09/03/13 0420  WBC 4.5 9.9  HGB 12.0* 11.9*  HCT 35.1* 35.3*  PLT 125* 121*     Recent Labs  08/31/13 1824 09/01/13 0525 09/02/13 0425 09/03/13 0420  NA 129* 136* 143 139  K 3.9 3.5* 3.7 3.3*  CL 94* 101 108 102  CO2 22 24 26 24   GLUCOSE 120* 139* 97 123*  BUN 37* 32* 26* 27*  CREATININE 1.40* 1.29 1.34 1.41*  CALCIUM 6.5* 7.0* 7.1* 7.6*  MG 1.7 2.0  --  1.8  PHOS 2.4 1.9* 2.2* 2.5  PROT  --  4.7*  --   --   ALBUMIN  --  1.7*  --   --   AST  --  54*  --   --   ALT  --  48  --   --   ALKPHOS  --  50  --   --   BILITOT  --  0.7  --   --   PREALBUMIN  --  4.7*  --   --   TRIG  --  250*  --   --    Estimated Creatinine Clearance: 83.2 ml/min (by C-G formula based on Cr of 1.41).    Recent Labs  09/02/13 1805 09/03/13 0022 09/03/13 0530  GLUCAP 102* 108* 120*    Medications:  Scheduled:  . antiseptic oral rinse  15 mL Mouth Rinse Q4H  . chlorhexidine  15 mL Mouth/Throat BID  . furosemide  40 mg Intravenous Q8H  . insulin aspart  1-3 Units Subcutaneous Q6H  . micafungin (MYCAMINE) IV  100 mg Intravenous Q24H  . pantoprazole (PROTONIX) IV  40 mg Intravenous Q12H  . piperacillin-tazobactam (ZOSYN)  IV  3.375 g Intravenous Q8H  . potassium chloride  10 mEq Intravenous Q1 Hr x 3  . vancomycin  1,000 mg  Intravenous Q12H    Insulin Requirements in the past 24 hours:  0 units Novolog ICU-SSI and 15 units/L in TPN, cbgs 79-120  Current Nutrition:  NPO   Assessment:  48 y.o. male presented to ED 1/8 with abd pain. CT consistent with perforated stomach ulcer due to NSAID use; pneumoperitoneum; peritonitis. Taken emergently to OR where he underwent exp lap, debridement of necrotic stomach, repair of perforated stomach. Pt must be strict NPO until contrast study can be done to eval gastric repair 1/15. TPN to be started in the interim.   GI: NPO.  Hypoactive BS, wounds OK  Prealb 4.7  Failed SBT, volume overloaded  Endo: No h/o DM. CBGs 79-120  Lytes: Na 139, K 3.3, Phos 2.5, mag 1.8  Renal: Cr 1.41- increasing. UOP 1.4 ml/Kg/hr  Pulm: VDRF since surgery. FiO2 40%   Cards:Back on pressors Inc Trop- NSTEMI vs demand ischemia. Back in NSR, amio off  HIT panel ordered  Hepatobil: AST 24, TG 250  Neuro: Sedated on fentanyl and versed. RASS goal -  2.   ID: Vanc/Zosyn/Micafungin D#6 for perf stomach with necrosis. Serosanguinous fluid from drains.  WBC 9.9,Tmax 101.5 Best Practices: Enox, PPI IV, MC  TPN Access: CVC 08/29/13  TPN day#: 5  Nutritional Goals:  Goal 2300-2400kcal and 150-160gm protein per RD recommendations.  Unlikely to meet protein requirements with premixed clinimix formula  Plan:  1. Cont Clinimix E 5/15 at 100 ml/hr with lipids at 2410ml/hr (no advancement today due to volume status) 2.  KCl 10 mEq IV x 3 3.  Cont Insulin at 15units/L  4.  F/u am TPN labs   James Mccormick PharmD Clinical Pharmacist [facility]

## 2013-09-03 NOTE — Progress Notes (Signed)
Peep increased to 8 d/t sats 88%.  Dr. Marchelle Gearingamaswamy at bedside and wants pt to be sedated and paralyzed before Aline and ABG done. Once ABG done, MD wishes for ARDS protocol to be initiated.

## 2013-09-03 NOTE — Progress Notes (Signed)
This note also relates to the following rows which could not be included: CVP (mmHg) - Cannot attach notes to unvalidated device data   sats decreased to 85-88. O2 breaths given. Remains rhonchus. Max sedation. -1 RASS to -2. Remains tense and rigid and grimaces with activity.

## 2013-09-03 NOTE — Progress Notes (Signed)
Increased fio2 to 50% d/t sats 86-87% on 40% fio2.  Sats improved to 91% on 50% fio2.

## 2013-09-04 ENCOUNTER — Inpatient Hospital Stay (HOSPITAL_COMMUNITY): Payer: Medicaid Other

## 2013-09-04 LAB — GLUCOSE, CAPILLARY
Glucose-Capillary: 105 mg/dL — ABNORMAL HIGH (ref 70–99)
Glucose-Capillary: 113 mg/dL — ABNORMAL HIGH (ref 70–99)
Glucose-Capillary: 101 mg/dL — ABNORMAL HIGH (ref 70–99)
Glucose-Capillary: 101 mg/dL — ABNORMAL HIGH (ref 70–99)
Glucose-Capillary: 100 mg/dL — ABNORMAL HIGH (ref 70–99)

## 2013-09-04 LAB — POCT I-STAT 3, ART BLOOD GAS (G3+)
Acid-Base Excess: 2 mmol/L (ref 0.0–2.0)
BICARBONATE: 27.2 meq/L — AB (ref 20.0–24.0)
BICARBONATE: 28 meq/L — AB (ref 20.0–24.0)
Bicarbonate: 26.5 mEq/L — ABNORMAL HIGH (ref 20.0–24.0)
O2 Saturation: 92 %
O2 Saturation: 94 %
O2 Saturation: 89 %
PH ART: 7.315 — AB (ref 7.350–7.450)
PH ART: 7.345 — AB (ref 7.350–7.450)
PO2 ART: 72 mmHg — AB (ref 80.0–100.0)
PO2 ART: 79 mmHg — AB (ref 80.0–100.0)
PO2 ART: 62 mmHg — AB (ref 80.0–100.0)
Patient temperature: 98.7
Patient temperature: 99.2
TCO2: 28 mmol/L (ref 0–100)
TCO2: 29 mmol/L (ref 0–100)
TCO2: 30 mmol/L (ref 0–100)
pCO2 arterial: 52.2 mmHg — ABNORMAL HIGH (ref 35.0–45.0)
pCO2 arterial: 53.6 mmHg — ABNORMAL HIGH (ref 35.0–45.0)
pCO2 arterial: 51.6 mmHg — ABNORMAL HIGH (ref 35.0–45.0)
pH, Arterial: 7.316 — ABNORMAL LOW (ref 7.350–7.450)

## 2013-09-04 LAB — COMPREHENSIVE METABOLIC PANEL
ALT: 21 U/L (ref 0–53)
AST: 28 U/L (ref 0–37)
Albumin: 1.4 g/dL — ABNORMAL LOW (ref 3.5–5.2)
Alkaline Phosphatase: 62 U/L (ref 39–117)
BUN: 29 mg/dL — AB (ref 6–23)
CHLORIDE: 101 meq/L (ref 96–112)
CO2: 25 meq/L (ref 19–32)
Calcium: 7.6 mg/dL — ABNORMAL LOW (ref 8.4–10.5)
Creatinine, Ser: 1.38 mg/dL — ABNORMAL HIGH (ref 0.50–1.35)
GFR calc non Af Amer: 59 mL/min — ABNORMAL LOW (ref >90)
GFR, EST AFRICAN AMERICAN: 68 mL/min — AB (ref >90)
GLUCOSE: 125 mg/dL — AB (ref 70–99)
Potassium: 3.6 mEq/L — ABNORMAL LOW (ref 3.7–5.3)
Sodium: 140 mEq/L (ref 137–147)
Total Bilirubin: 1 mg/dL (ref 0.3–1.2)
Total Protein: 4.9 g/dL — ABNORMAL LOW (ref 6.0–8.3)

## 2013-09-04 LAB — CBC WITH DIFFERENTIAL/PLATELET
BASOS ABS: 0.3 10*3/uL — AB (ref 0.0–0.1)
Basophils Relative: 2 % — ABNORMAL HIGH (ref 0–1)
EOS ABS: 0.4 10*3/uL (ref 0.0–0.7)
Eosinophils Relative: 3 % (ref 0–5)
HCT: 35.6 % — ABNORMAL LOW (ref 39.0–52.0)
Hemoglobin: 11.7 g/dL — ABNORMAL LOW (ref 13.0–17.0)
LYMPHS PCT: 11 % — AB (ref 12–46)
Lymphs Abs: 1.4 10*3/uL (ref 0.7–4.0)
MCH: 30.5 pg (ref 26.0–34.0)
MCHC: 32.9 g/dL (ref 30.0–36.0)
MCV: 92.7 fL (ref 78.0–100.0)
Monocytes Absolute: 1 10*3/uL (ref 0.1–1.0)
Monocytes Relative: 8 % (ref 3–12)
Neutro Abs: 9.8 10*3/uL — ABNORMAL HIGH (ref 1.7–7.7)
Neutrophils Relative %: 76 % (ref 43–77)
PLATELETS: 152 10*3/uL (ref 150–400)
RBC: 3.84 MIL/uL — ABNORMAL LOW (ref 4.22–5.81)
RDW: 15.1 % (ref 11.5–15.5)
WBC MORPHOLOGY: INCREASED
WBC: 12.9 10*3/uL — ABNORMAL HIGH (ref 4.0–10.5)

## 2013-09-04 LAB — PROTIME-INR
INR: 1.04 (ref 0.00–1.49)
Prothrombin Time: 13.4 seconds (ref 11.6–15.2)

## 2013-09-04 LAB — PROCALCITONIN: Procalcitonin: 15.05 ng/mL

## 2013-09-04 LAB — LACTIC ACID, PLASMA: Lactic Acid, Venous: 1.4 mmol/L (ref 0.5–2.2)

## 2013-09-04 LAB — PHOSPHORUS: PHOSPHORUS: 5.1 mg/dL — AB (ref 2.3–4.6)

## 2013-09-04 LAB — HEPARIN INDUCED THROMBOCYTOPENIA PNL
Heparin Induced Plt Ab: NEGATIVE
Patient O.D.: 0.028
UFH HIGH DOSE UFH H: 1 %
UFH LOW DOSE 0.5 IU/ML: 0 %
UFH Low Dose 0.1 IU/mL: 0 % Release
UFH SRA Result: NEGATIVE

## 2013-09-04 LAB — CK: Total CK: 396 U/L — ABNORMAL HIGH (ref 7–232)

## 2013-09-04 LAB — MAGNESIUM: MAGNESIUM: 1.9 mg/dL (ref 1.5–2.5)

## 2013-09-04 LAB — VANCOMYCIN, TROUGH: Vancomycin Tr: 18.6 ug/mL (ref 10.0–20.0)

## 2013-09-04 MED ORDER — MAGNESIUM SULFATE 40 MG/ML IJ SOLN
2.0000 g | Freq: Once | INTRAMUSCULAR | Status: AC
  Administered 2013-09-04: 2 g via INTRAVENOUS
  Filled 2013-09-04: qty 50

## 2013-09-04 MED ORDER — TRACE MINERALS CR-CU-F-FE-I-MN-MO-SE-ZN IV SOLN
INTRAVENOUS | Status: AC
  Administered 2013-09-04: 18:00:00 via INTRAVENOUS
  Filled 2013-09-04: qty 2400

## 2013-09-04 MED ORDER — ENOXAPARIN SODIUM 40 MG/0.4ML ~~LOC~~ SOLN
40.0000 mg | SUBCUTANEOUS | Status: DC
  Administered 2013-09-04 – 2013-09-20 (×17): 40 mg via SUBCUTANEOUS
  Filled 2013-09-04 (×17): qty 0.4

## 2013-09-04 MED ORDER — POTASSIUM CHLORIDE 10 MEQ/50ML IV SOLN
10.0000 meq | INTRAVENOUS | Status: AC
  Administered 2013-09-04 (×3): 10 meq via INTRAVENOUS
  Filled 2013-09-04 (×3): qty 50

## 2013-09-04 MED ORDER — FAT EMULSION 20 % IV EMUL
240.0000 mL | INTRAVENOUS | Status: AC
  Administered 2013-09-04: 240 mL via INTRAVENOUS
  Filled 2013-09-04: qty 250

## 2013-09-04 NOTE — Progress Notes (Addendum)
Name: James LeydenChristopher D Mccormick MRN: [id number] DOB: 08/02/1966    ADMISSION DATE:  08/28/2013 CONSULTATION DATE:  08/28/13 LOS 7 days   REFERRING MD :  Andrey CampanileWilson PRIMARY SERVICE: CCS  CHIEF COMPLAINT:  Abdominal Pain  BRIEF PATIENT DESCRIPTION:  48 yo male smoker presented with abdominal pain from perforated gastric ulcer in setting of NSAID.  PCCM consulted to assist with septic shock and respiratory failure management.  SIGNIFICANT EVENTS: 1/8 Exploratory laparotomy, debridement of necrotic stomach with scissors, primary repair of perforated stomach in 2 layers.  Septic shock, VDRF, a fib post-op. 1/9 Cardiology consulted . ECHO EF 45% 1/11: still pressor dependent, NSR for > 24 hrs. Amiodarone stopped.  1/12: remain son levophed,. Left radial a line dc'ed 09/02/13: off levophed. Encephalopathic - randomly agitated. Mmit 08/28/2013 . TPN continuesoving around, biting tube, looks randomly, does not track. Failed SBT. Febrile. + 24 Liters. Back on neo 09/03/13: ARDS Protocol with 48h nimbex started. Left radial artery occluded - seen by V[facility]. Expectant mgmt. Still on neo   STUDIES:  1/8 CT abd/pelvis >> pneumoperitoneum, Lt kidney stone, diverticulosis 1/9 Echo >> mild LVH, EF 40 to 45%, grade 1 diastolic dysfx  LINES / TUBES: 1/8 ETT >>  1/8 Rt IJ CVL >> 1/8 Lt radial aline >>>1/12 1/14 - Rt radial aline>>  CULTURES: Blood 1/9 >>     ANTIBIOTICS: Vancomycin 1/8 >>>1/14 Zosyn 1/8 >>>>1/14 Micafungin 1/9 >>>> Imipenem 1/14 >>     SUBJECTIVE:  1/15: 50% fio2 on ARDS protocol with nimbex. LEft radial artery occlusion events noted. CCS considering CT abd v UGI scope. Fever curve improving after abx change 24h ago   VITAL SIGNS: Temp:  [98.3 F (36.8 C)-101.7 F (38.7 C)] 98.3 F (36.8 C) (01/15 0736) Pulse Rate:  [34-127] 127 (01/15 0735) Resp:  [23-38] 34 (01/15 0735) BP: (76-169)/(42-94) 127/60 mmHg (01/15 0735) SpO2:  [87 %-97 %] 91 % (01/15 0735) Arterial Line BP:  (70-191)/(32-95) 141/59 mmHg (01/15 0700) FiO2 (%):  [40 %-100 %] 50 % (01/15 0735) HEMODYNAMICS: CVP:  [12 mmHg-19 mmHg] 13 mmHg VENTILATOR SETTINGS: Vent Mode:  [-] PRVC FiO2 (%):  [40 %-100 %] 50 % Set Rate:  [24 bmp-34 bmp] 34 bmp Vt Set:  [450 mL-530 mL] 450 mL PEEP:  [8 cmH20-12 cmH20] 10 cmH20 Plateau Pressure:  [30 cmH20-37 cmH20] 37 cmH20 INTAKE / OUTPUT: Intake/Output     01/14 0701 - 01/15 0700 01/15 0701 - 01/16 0700   I.V. (mL/kg) 2509.7 (21.1) 92.5 (0.8)   NG/GT 60    IV Piggyback 1100    TPN 2540 220   Total Intake(mL/kg) 6209.7 (52.2) 312.5 (2.6)   Urine (mL/kg/hr) 3650 (1.3) 250 (1)   Emesis/NG output 800 (0.3)    Drains 20 (0)    Total Output 4470 250   Net +1739.7 +62.5          PHYSICAL EXAMINATION: General: no distress, heavily sedated, rass -4 Neuro:  RASS -4 on fent and precedex HEENT: ETT in place Cardiovascular: s1 s2 SR no r/g. On neo due to precedex Lungs: coarse bilat Abdomen: wound dressing clean, drains w/ ser/sang drainage, abd distended, hypoactive   Musculoskeletal: 2+ edema, gen Skin: lower extremities cool  LABS: PULMONARY  Recent Labs Lab 09/03/13 1408 09/03/13 1513 09/04/13 0235 09/04/13 0423 09/04/13 0538  PHART 7.395 7.314* 7.315* 7.316* 7.345*  PCO2ART 42.4 49.3* 52.2* 53.6* 51.6*  PO2ART 49.0* 48.0* 72.0* 79.0* 62.0*  HCO3 26.0* 25.1* 26.5* 27.2* 28.0*  TCO2 27 27 28  29 30  O2SAT 84.0 80.0 92.0 94.0 89.0    CBC  Recent Labs Lab 09/01/13 0525 09/03/13 0420 09/04/13 0358  HGB 12.0* 11.9* 11.7*  HCT 35.1* 35.3* 35.6*  WBC 4.5 9.9 12.9*  PLT 125* 121* 152    COAGULATION  Recent Labs Lab 09/04/13 0358  INR 1.04    CARDIAC    Recent Labs Lab 08/29/13 1933 08/30/13 0256 08/30/13 0638 09/03/13 0420  TROPONINI <0.30 <0.30 1.22* <0.30    Recent Labs Lab 09/03/13 0420  PROBNP 358.1*     CHEMISTRY  Recent Labs Lab 08/31/13 0330  08/31/13 1824 09/01/13 0525 09/02/13 0425 09/03/13 0420  09/04/13 0358  NA 133*  < > 129* 136* 143 139 140  K 3.6*  < > 3.9 3.5* 3.7 3.3* 3.6*  CL 96  < > 94* 101 108 102 101  CO2 23  < > 22 24 26 24 25   GLUCOSE 147*  < > 120* 139* 97 123* 125*  BUN 39*  < > 37* 32* 26* 27* 29*  CREATININE 1.73*  < > 1.40* 1.29 1.34 1.41* 1.38*  CALCIUM 6.3*  < > 6.5* 7.0* 7.1* 7.6* 7.6*  MG 1.6  --  1.7 2.0  --  1.8 1.9  PHOS 3.4  --  2.4 1.9* 2.2* 2.5 5.1*  < > = values in this interval not displayed. Estimated Creatinine Clearance: 85 ml/min (by C-G formula based on Cr of 1.38).   LIVER  Recent Labs Lab 08/30/13 0638 08/30/13 1147 08/31/13 0330 09/01/13 0525 09/04/13 0358  AST 79* 86* 97* 54* 28  ALT 41 45 61* 48 21  ALKPHOS 34* 38* 43 50 62  BILITOT 0.3 0.3 0.5 0.7 1.0  PROT 3.9* 4.1* 4.2* 4.7* 4.9*  ALBUMIN 1.8* 1.9* 1.8* 1.7* 1.4*  INR  --   --   --   --  1.04     INFECTIOUS  Recent Labs Lab 08/29/13 1821  08/31/13 0330 09/03/13 0421 09/03/13 0959 09/04/13 0358  LATICACIDVEN 4.1*  --   --  1.6  --  1.4  PROCALCITON  --   < > 138.17  --  24.32 15.05  < > = values in this interval not displayed.   ENDOCRINE CBG (last 3)   Recent Labs  09/03/13 1134 09/03/13 1717 09/04/13 0010  GLUCAP 113* 116* 105*         IMAGING x48h  Dg Chest Port 1 View  09/04/2013   CLINICAL DATA:  Pulmonary edema.  EXAM: PORTABLE CHEST - 1 VIEW  COMPARISON:  09/03/2013.  FINDINGS: Endotracheal tube, right IJ line , NG tube in stable position. Dense persistent bilateral pulmonary alveolar infiltrates are present. These are unchanged. Cardiac silhouette is difficult to evaluate due to dense pulmonary infiltrates. No pleural effusion identified. Left costophrenic angle is not imaged.  IMPRESSION: 1. Stable line and tube positions. 2. Dense bilateral unchanged pulmonary infiltrates. Differential diagnosis includes cardiogenic and noncardiogenic pulmonary edema including ARDS. Bilateral severe pneumonia could also present in this fashion .    Electronically Signed   By: Maisie Fus  Register   On: 09/04/2013 07:44   Dg Chest Port 1 View  09/03/2013   CLINICAL DATA:  Check endotracheal tube.  EXAM: PORTABLE CHEST - 1 VIEW  COMPARISON:  Chest x-ray from yesterday.  FINDINGS: Endotracheal tube ends in the mid thoracic trachea. Enteric tube reaches the stomach. Stable positioning of right IJ catheter.  Diffuse worsening of aeration, with extensive airspace disease. Low volume lungs with haziness  of the lower chest likely reflecting pleural fluid.  IMPRESSION: 1. Significant worsening aeration from yesterday, favor alveolar pulmonary edema (which could be cardiogenic or noncardiogenic). 2. Probable bilateral pleural effusions. 3. Good positioning of tubes and lines.   Electronically Signed   By: Tiburcio Pea M.D.   On: 09/03/2013 06:52       ASSESSMENT / PLAN:  PULMONARY A:  Acute respiratory failure in setting of peritonitis Admissions asp? RLL ATX Bibasilar atx, probable sympathetic effusions 1/11 Hx of smoking >> air trapping on vent 1/10.  09/04/13: Severe ARDS persists  PLAN  -Full vent support - Contniue ARDS PROTOCOL with 48h nimbex starting 09/03/13  CARDIAC A:  Septic shock 2nd to peritonitis. A fib with RVR, resolved, SR for > 24hrs // s/p IV amio 08/29/13 through 08/30/13 Acute systolic heart failure likely 2nd to sepsis. NSTEMI from demand ischemia. Hx of HTN with chronic diastolic dysfx. ECHO 1/19 with systolic CHF  09/02/13: + 24L since admit 08/28/2013. CVP 11. 3rd spacing + 09/04/13: +21L sine admit. Still on neo low dose. 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 Seen by [facility] P  - expectant mgmt  RENAL A:   Acute kidney injury 2nd to hypovolemia, and ATN in setting of shock. He has had some marginal bump in scr but hoping that this reflects plateau  Metabolic acidosis >> improved.  09/04/13: Mildly improving but elevated  creat.  Low K  and Low Mag  P:   - replete K and Mag - monitopr creat with lasix  - reduce IV fluids to saline kvo  GASTROINTESTINAL A:   Perforated gastric ulcer in setting of NSAID use as outpt. Protein calorie malnutrition.  09/03/13: CCS might get CT abd 09/04/13: CCS considering UGI scope  P:   -post -op care per CCS -protonix BID -continue TNA per CCS - CT abd when stable  HEMATOLOGIC A:   Anemia of critical illness Low platelet ? Due to sepsis/HIT pending 09/02/13 (low risk for HIT)  P:  -f/u CBC in am  -SQ lovenox for DVT prevention   INFECTIOUS A:  Septic shock 2nd to peritonitis.  09/02/13: Off pressors x 24h 09/03/13: back on neo with precedex on board. FEbrile 09/04/13: still on low dose neo. Stil febrile but better.   P:   -continue vancomycin,  micafungin with limited pressor imrpovement - Continue Imipenem -drain management per surg -no role pct - CT abd when stable per CCS  ENDOCRINE A:   Hyperglycemia. P:   -SSI -at risk burn out adrenal insuff, repeat cortisol in 2 days if not improved   NEUROLOGIC A:   Acute encephalopathy 2nd to septic shock.  09/04/13: Deeply sedated and  Paralyzed x 24h due to ARDS protocol. BIS 31 P:   - deep sedation with fent/versed due top ARDS -goal RASS -4 - cont fent - dc precedex   GLOBAL 09/02/13: Tammy the significant other updated briefly  at bedside with RN present . She did not have any questions.   09/03/13: no family at bedside. Sister discussed in detail over phone. Sister says she will be primary contat. Daughter to be secondary contact. No details other than brief updates to significant other. Explained ARDS. Mom died recently in ICU with similar issues so sister familiar with concepts of ICU. Says she is realistic that this will take weeks or months. Understands acutely uill and high mortality situaton  09/04/13: No  change. No family at bedside. Discussed plan with Dr [name] from bedside   The patient is  critically ill with multiple organ systems failure and requires high complexity decision making for assessment and support, frequent evaluation and titration of therapies, application of advanced monitoring technologies and extensive interpretation of multiple databases.   Critical Care Time devoted to patient care services described in this note is  45  Minutes.    Dr. Kalman Shan, M.D., [facility].C.P Pulmonary and Critical Care Medicine Staff Physician [facility] [place] Pager: [phone], If no answer or between  15:00h - 7:00h: call [phone]  09/04/2013 9:12 AM

## 2013-09-04 NOTE — Progress Notes (Signed)
PARENTERAL NUTRITION CONSULT NOTE - FOLLOW UP  Pharmacy Consult for TPN Indication: Prolonged ileus  No Known Allergies  Patient Measurements: Height: 5\' 11"  (180.3 cm) Weight: 262 lb 5.6 oz (119 kg) IBW/kg (Calculated) : 75.3 Adjusted Body Weight:  Usual Weight:   Vital Signs: Temp: 99.7 F (37.6 C) (01/15 0600) Temp src: Rectal (01/15 0600) BP: 129/73 mmHg (01/15 0700) Pulse Rate: 126 (01/15 0700) Intake/Output from previous day: 01/14 0701 - 01/15 0700 In: 6209.7 [I.V.:2509.7; NG/GT:60; IV Piggyback:1100; TPN:2540] Out: 4470 [Urine:3650; Emesis/NG output:800; Drains:20] Intake/Output from this shift:    Labs:  Recent Labs  09/03/13 0420 09/04/13 0358  WBC 9.9 12.9*  HGB 11.9* 11.7*  HCT 35.3* 35.6*  PLT 121* 152  INR  --  1.04     Recent Labs  09/02/13 0425 09/03/13 0420 09/04/13 0358  NA 143 139 140  K 3.7 3.3* 3.6*  CL 108 102 101  CO2 26 24 25   GLUCOSE 97 123* 125*  BUN 26* 27* 29*  CREATININE 1.34 1.41* 1.38*  CALCIUM 7.1* 7.6* 7.6*  MG  --  1.8 1.9  PHOS 2.2* 2.5 5.1*  PROT  --   --  4.9*  ALBUMIN  --   --  1.4*  AST  --   --  28  ALT  --   --  21  ALKPHOS  --   --  62  BILITOT  --   --  1.0   Estimated Creatinine Clearance: 85 ml/min (by C-G formula based on Cr of 1.38).    Recent Labs  09/03/13 1134 09/03/13 1717 09/04/13 0010  GLUCAP 113* 116* 105*    Medications:  Scheduled:  . antiseptic oral rinse  15 mL Mouth Rinse QID  . artificial tears  1 application Both Eyes Q8H  . chlorhexidine  15 mL Mouth/Throat BID  . furosemide  40 mg Intravenous Q12H  . imipenem-cilastatin  500 mg Intravenous Q6H  . insulin aspart  1-3 Units Subcutaneous Q6H  . micafungin (MYCAMINE) IV  100 mg Intravenous Q24H  . pantoprazole (PROTONIX) IV  40 mg Intravenous Q12H  . vancomycin  1,000 mg Intravenous Q12H    Insulin Requirements in the past 24 hours:  0 units Novolog ICU-SSI and 15 units/L in TPN, cbgs 105-120  Current Nutrition:  NPO    Assessment:  48 y.o. male presented to ED 1/8 with abd pain. CT consistent with perforated stomach ulcer due to NSAID use; pneumoperitoneum; peritonitis. Taken emergently to OR where he underwent exp lap, debridement of necrotic stomach, repair of perforated stomach. Pt must be strict NPO until contrast study can be done to eval gastric repair 1/15. TPN to be started in the interim.  Possible radial artery occlusion, no surgery planned  GI: NPO.  Hypoactive BS, wounds OK  Prealb 4.7  Failed SBT, volume overloaded.  Plan CT abdomen  Endo: No h/o DM. CBGs 105-120  Lytes: Na 140, K 3.6, Phos 5.1, mag 1.9 Corr Ca 9.68  CoCa X phos = 49  Renal: Cr 1.38   UOP 1.3 ml/Kg/hr  Pulm: VDRF since surgery. FiO2 40%  Per MD a little more hypoxemic, ? ARDS  Cards:Off pressors   Inc Trop- NSTEMI vs demand ischemia. Back in NSR, amio off  HIT panel ordered  Hepatobil: LFTs wnl   TG 250  Neuro: Sedated on fentanyl and versed. RASS goal -4   ID: Vanc/Primaxin/Micafungin  for perf stomach with necrosis. Serosanguinous fluid from drains.  WBC 12.9,Tmax 102.1 Best Practices:  Enox, PPI IV, MC  TPN Access: CVC 08/29/13  TPN day#: 6  Nutritional Goals:  Goal 2300-2400kcal and 150-160gm protein per RD recommendations.  Unlikely to meet protein requirements with premixed clinimix formula  Plan:  1. Change Clinimix to 5/15 without lytes.  Cont 100 ml/hr with lipids at 54ml/hr (no advancement today due to volume status) 2.  Will give KCl 10 mEq X 3 as there will be no lytes in TPN 3.  Cont Insulin at 15units/L  4.  F/u am labs   Talbert Cage PharmD Clinical Pharmacist [facility]

## 2013-09-04 NOTE — Progress Notes (Signed)
ANTIBIOTIC CONSULT NOTE - Follow-up  Pharmacy Consult for primaxin, vancomycin, micafungin Indication: empiric lung and abdominal coverage  No Known Allergies  Patient Measurements: Height: 5\' 11"  (180.3 cm) Weight: 262 lb 5.6 oz (119 kg) IBW/kg (Calculated) : 75.3  Vital Signs: Temp: 98.7 F (37.1 C) (01/15 1102) Temp src: Oral (01/15 1102) BP: 96/56 mmHg (01/15 1131) Pulse Rate: 111 (01/15 1131) Intake/Output from previous day: 01/14 0701 - 01/15 0700 In: 6209.7 [I.V.:2509.7; NG/GT:60; IV Piggyback:1100; TPN:2540] Out: 4470 [Urine:3650; Emesis/NG output:800; Drains:20] Intake/Output from this shift: Total I/O In: 377.6 [I.V.:107.6; IV Piggyback:50; TPN:220] Out: 500 [Urine:500]  Labs:  Recent Labs  09/02/13 0425 09/03/13 0420 09/04/13 0358  WBC  --  9.9 12.9*  HGB  --  11.9* 11.7*  PLT  --  121* 152  CREATININE 1.34 1.41* 1.38*   Estimated Creatinine Clearance: 85 ml/min (by C-G formula based on Cr of 1.38).  Recent Labs  09/04/13 1115  VANCOTROUGH 18.6     Microbiology: Recent Results (from the past 720 hour(s))  CULTURE, BLOOD (ROUTINE X 2)     Status: None   Collection Time    08/29/13 12:35 AM      Result Value Range Status   Specimen Description BLOOD RIGHT [place]   Final   Special Requests BOTTLES DRAWN AEROBIC ONLY 8CC   Final   Culture  Setup Time     Final   Value: 08/30/2013 05:14     Performed at [facility]   Culture     Final   Value:        BLOOD CULTURE RECEIVED NO GROWTH TO DATE CULTURE WILL BE HELD FOR 5 DAYS BEFORE ISSUING A FINAL NEGATIVE REPORT     Performed at [facility]   Report Status PENDING   Incomplete  MRSA PCR SCREENING     Status: None   Collection Time    08/29/13  7:55 AM      Result Value Range Status   MRSA by PCR NEGATIVE  NEGATIVE Final   Comment:            The GeneXpert MRSA Assay (FDA     approved for NASAL specimens     only), is one component of a     comprehensive MRSA colonization   surveillance program. It is not     intended to diagnose MRSA     infection nor to guide or     monitor treatment for     MRSA infections.  CULTURE, BLOOD (ROUTINE X 2)     Status: None   Collection Time    08/29/13 11:19 PM      Result Value Range Status   Specimen Description BLOOD RIGHT FOOT   Final   Special Requests BOTTLES DRAWN AEROBIC ONLY 0.5CC   Final   Culture  Setup Time     Final   Value: 08/30/2013 05:13     Performed at [facility]   Culture     Final   Value:        BLOOD CULTURE RECEIVED NO GROWTH TO DATE CULTURE WILL BE HELD FOR 5 DAYS BEFORE ISSUING A FINAL NEGATIVE REPORT     Performed at [facility]   Report Status PENDING   Incomplete    Medical History: Past Medical History  Diagnosis Date  . GERD (gastroesophageal reflux disease)   . Hypertension     Medications:  Anti-infectives   Start     Dose/Rate Route  Frequency Ordered Stop   09/03/13 1100  imipenem-cilastatin (PRIMAXIN) 500 mg in sodium chloride 0.9 % 100 mL IVPB     500 mg 200 mL/hr over 30 Minutes Intravenous Every 6 hours 09/03/13 1007     09/02/13 1300  vancomycin (VANCOCIN) IVPB 1000 mg/200 mL premix     1,000 mg 200 mL/hr over 60 Minutes Intravenous Every 12 hours 09/01/13 1527     08/30/13 2000  vancomycin (VANCOCIN) IVPB 750 mg/150 ml premix  Status:  Discontinued     750 mg 150 mL/hr over 60 Minutes Intravenous Every 8 hours 08/30/13 1356 09/01/13 1527   08/29/13 2200  micafungin (MYCAMINE) 100 mg in sodium chloride 0.9 % 100 mL IVPB     100 mg 100 mL/hr over 1 Hours Intravenous Every 24 hours 08/29/13 2036     08/29/13 0400  vancomycin (VANCOCIN) IVPB 1000 mg/200 mL premix  Status:  Discontinued     1,000 mg 200 mL/hr over 60 Minutes Intravenous Every 8 hours 08/28/13 2358 08/30/13 1356   08/29/13 0400  piperacillin-tazobactam (ZOSYN) IVPB 3.375 g  Status:  Discontinued     3.375 g 12.5 mL/hr over 240 Minutes Intravenous 3 times per day 08/28/13 2358 09/03/13  1002   08/28/13 1845  [MAR Hold]  vancomycin (VANCOCIN) IVPB 1000 mg/200 mL premix     (On MAR Hold since 08/28/13 1957)   1,000 mg 200 mL/hr over 60 Minutes Intravenous  Once 08/28/13 1831 08/28/13 1955   08/28/13 1845  [MAR Hold]  piperacillin-tazobactam (ZOSYN) IVPB 3.375 g     (On MAR Hold since 08/28/13 1957)   3.375 g 100 mL/hr over 30 Minutes Intravenous  Once 08/28/13 1831 08/28/13 2056     Assessment: 47 yom presented to the hospital with abdominal pain. Now s/p debridement of necrotic stomach and repair of stomach perf. He has been on broad-spectrum antibiotics and antifungals since 1/8. A vancomycin trough was checked today and is therapeutic at 18.6 however it was drawn early. Likely would have still been at goal if drawn appropriately. Renal function has been fluctuating.  VT (1/10): 22.6 on 1000 mg IV Q 8 hours  VT (1/12): 22.8 on 750mg  IV Q 8 hours 1/15 VT = 18.6 (drawn 1.5 hrs early)  Vanc 1/8>> Zosyn 1/8>>1/14 Micafungin 1/9>> Primaxin 1/14>>  1/9 MRSA - NEG 1/9 Blood>> ngtd  Goal of Therapy:  Eradication of infection  Plan:  1. Continue vancomycin 1gm IV Q12H 2. Continue primaxin 500mg  IV Q6H 3. Continue micafungin 100mg  IV Q24H 4. F/u renal fxn, C&S, clinical status and [facility]  Tearsa Kowalewski, PharmD, BCPS Pager # [phone] 09/04/2013 12:14 PM

## 2013-09-04 NOTE — Progress Notes (Signed)
7 Days Post-Op  Subjective: Pt on paralytics/ vent.   Objective: Vital signs in last 24 hours: Temp:  [98.3 F (36.8 C)-102.1 F (38.9 C)] 98.3 F (36.8 C) (01/15 0736) Pulse Rate:  [34-126] 126 (01/15 0700) Resp:  [23-38] 34 (01/15 0700) BP: (76-169)/(42-94) 129/73 mmHg (01/15 0700) SpO2:  [87 %-100 %] 90 % (01/15 0700) Arterial Line BP: (70-191)/(32-95) 141/59 mmHg (01/15 0700) FiO2 (%):  [40 %-100 %] 50 % (01/15 0502) Last BM Date:  (unknown)  Intake/Output from previous day: 01/14 0701 - 01/15 0700 In: 6209.7 [I.V.:2509.7; NG/GT:60; IV Piggyback:1100; TPN:2540] Out: 4470 [Urine:3650; Emesis/NG output:800; Drains:20] Intake/Output this shift:    Incision/Wound:clean open JP  look serous minimal output.   Lab Results:   Recent Labs  09/03/13 0420 09/04/13 0358  WBC 9.9 12.9*  HGB 11.9* 11.7*  HCT 35.3* 35.6*  PLT 121* 152   BMET  Recent Labs  09/03/13 0420 09/04/13 0358  NA 139 140  K 3.3* 3.6*  CL 102 101  CO2 24 25  GLUCOSE 123* 125*  BUN 27* 29*  CREATININE 1.41* 1.38*  CALCIUM 7.6* 7.6*   PT/INR  Recent Labs  09/04/13 0358  LABPROT 13.4  INR 1.04   ABG  Recent Labs  09/04/13 0423 09/04/13 0538  PHART 7.316* 7.345*  HCO3 27.2* 28.0*    Studies/Results: Dg Chest Port 1 View  09/04/2013   CLINICAL DATA:  Pulmonary edema.  EXAM: PORTABLE CHEST - 1 VIEW  COMPARISON:  09/03/2013.  FINDINGS: Endotracheal tube, right IJ line , NG tube in stable position. Dense persistent bilateral pulmonary alveolar infiltrates are present. These are unchanged. Cardiac silhouette is difficult to evaluate due to dense pulmonary infiltrates. No pleural effusion identified. Left costophrenic angle is not imaged.  IMPRESSION: 1. Stable line and tube positions. 2. Dense bilateral unchanged pulmonary infiltrates. Differential diagnosis includes cardiogenic and noncardiogenic pulmonary edema including ARDS. Bilateral severe pneumonia could also present in this fashion .    Electronically Signed   By: Maisie Fushomas  Register   On: 09/04/2013 07:44   Dg Chest Port 1 View  09/03/2013   CLINICAL DATA:  Check endotracheal tube.  EXAM: PORTABLE CHEST - 1 VIEW  COMPARISON:  Chest x-ray from yesterday.  FINDINGS: Endotracheal tube ends in the mid thoracic trachea. Enteric tube reaches the stomach. Stable positioning of right IJ catheter.  Diffuse worsening of aeration, with extensive airspace disease. Low volume lungs with haziness of the lower chest likely reflecting pleural fluid.  IMPRESSION: 1. Significant worsening aeration from yesterday, favor alveolar pulmonary edema (which could be cardiogenic or noncardiogenic). 2. Probable bilateral pleural effusions. 3. Good positioning of tubes and lines.   Electronically Signed   By: Tiburcio PeaJonathan  Watts M.D.   On: 09/03/2013 06:52    Anti-infectives: Anti-infectives   Start     Dose/Rate Route Frequency Ordered Stop   09/03/13 1100  imipenem-cilastatin (PRIMAXIN) 500 mg in sodium chloride 0.9 % 100 mL IVPB     500 mg 200 mL/hr over 30 Minutes Intravenous Every 6 hours 09/03/13 1007     09/02/13 1300  vancomycin (VANCOCIN) IVPB 1000 mg/200 mL premix     1,000 mg 200 mL/hr over 60 Minutes Intravenous Every 12 hours 09/01/13 1527     08/30/13 2000  vancomycin (VANCOCIN) IVPB 750 mg/150 ml premix  Status:  Discontinued     750 mg 150 mL/hr over 60 Minutes Intravenous Every 8 hours 08/30/13 1356 09/01/13 1527   08/29/13 2200  micafungin (MYCAMINE) 100 mg in  sodium chloride 0.9 % 100 mL IVPB     100 mg 100 mL/hr over 1 Hours Intravenous Every 24 hours 08/29/13 2036     08/29/13 0400  vancomycin (VANCOCIN) IVPB 1000 mg/200 mL premix  Status:  Discontinued     1,000 mg 200 mL/hr over 60 Minutes Intravenous Every 8 hours 08/28/13 2358 08/30/13 1356   08/29/13 0400  piperacillin-tazobactam (ZOSYN) IVPB 3.375 g  Status:  Discontinued     3.375 g 12.5 mL/hr over 240 Minutes Intravenous 3 times per day 08/28/13 2358 09/03/13 1002   08/28/13  1845  [MAR Hold]  vancomycin (VANCOCIN) IVPB 1000 mg/200 mL premix     (On MAR Hold since 08/28/13 1957)   1,000 mg 200 mL/hr over 60 Minutes Intravenous  Once 08/28/13 1831 08/28/13 1955   08/28/13 1845  [MAR Hold]  piperacillin-tazobactam (ZOSYN) IVPB 3.375 g     (On MAR Hold since 08/28/13 1957)   3.375 g 100 mL/hr over 30 Minutes Intravenous  Once 08/28/13 1831 08/28/13 2056      Assessment/Plan: s/p Procedure(s): EXPLORATORY LAPAROTOMY,debridment of nacrotic stomach,and primary repair of perforated stomach. (N/A) ARDS per CCM Will need UGI once more stable JP serous at this point.   LOS: 7 days    Sira Adsit A. 09/04/2013

## 2013-09-05 ENCOUNTER — Inpatient Hospital Stay (HOSPITAL_COMMUNITY): Payer: Medicaid Other

## 2013-09-05 LAB — GLUCOSE, CAPILLARY
Glucose-Capillary: 103 mg/dL — ABNORMAL HIGH (ref 70–99)
Glucose-Capillary: 108 mg/dL — ABNORMAL HIGH (ref 70–99)
Glucose-Capillary: 124 mg/dL — ABNORMAL HIGH (ref 70–99)
Glucose-Capillary: 129 mg/dL — ABNORMAL HIGH (ref 70–99)

## 2013-09-05 LAB — CULTURE, BLOOD (ROUTINE X 2)
Culture: NO GROWTH
Culture: NO GROWTH

## 2013-09-05 LAB — POCT I-STAT 3, ART BLOOD GAS (G3+)
Acid-Base Excess: 2 mmol/L (ref 0.0–2.0)
BICARBONATE: 28.1 meq/L — AB (ref 20.0–24.0)
O2 SAT: 89 %
PCO2 ART: 53.4 mmHg — AB (ref 35.0–45.0)
PH ART: 7.332 — AB (ref 7.350–7.450)
PO2 ART: 64 mmHg — AB (ref 80.0–100.0)
Patient temperature: 99.5
TCO2: 30 mmol/L (ref 0–100)

## 2013-09-05 LAB — BASIC METABOLIC PANEL
BUN: 31 mg/dL — ABNORMAL HIGH (ref 6–23)
CALCIUM: 7.4 mg/dL — AB (ref 8.4–10.5)
CO2: 24 mEq/L (ref 19–32)
Chloride: 102 mEq/L (ref 96–112)
Creatinine, Ser: 1.57 mg/dL — ABNORMAL HIGH (ref 0.50–1.35)
GFR, EST AFRICAN AMERICAN: 58 mL/min — AB (ref >90)
GFR, EST NON AFRICAN AMERICAN: 50 mL/min — AB (ref >90)
Glucose, Bld: 113 mg/dL — ABNORMAL HIGH (ref 70–99)
Potassium: 3.6 mEq/L — ABNORMAL LOW (ref 3.7–5.3)
SODIUM: 139 meq/L (ref 137–147)

## 2013-09-05 LAB — CBC WITH DIFFERENTIAL/PLATELET
BASOS PCT: 1 % (ref 0–1)
Basophils Absolute: 0.1 10*3/uL (ref 0.0–0.1)
EOS ABS: 0.4 10*3/uL (ref 0.0–0.7)
Eosinophils Relative: 4 % (ref 0–5)
HCT: 33.9 % — ABNORMAL LOW (ref 39.0–52.0)
HEMOGLOBIN: 11.2 g/dL — AB (ref 13.0–17.0)
LYMPHS PCT: 10 % — AB (ref 12–46)
Lymphs Abs: 1.1 10*3/uL (ref 0.7–4.0)
MCH: 29.9 pg (ref 26.0–34.0)
MCHC: 33 g/dL (ref 30.0–36.0)
MCV: 90.6 fL (ref 78.0–100.0)
MONO ABS: 0.8 10*3/uL (ref 0.1–1.0)
Monocytes Relative: 7 % (ref 3–12)
NEUTROS PCT: 78 % — AB (ref 43–77)
Neutro Abs: 8.4 10*3/uL — ABNORMAL HIGH (ref 1.7–7.7)
Platelets: 204 10*3/uL (ref 150–400)
RBC: 3.74 MIL/uL — AB (ref 4.22–5.81)
RDW: 15.2 % (ref 11.5–15.5)
WBC: 10.8 10*3/uL — ABNORMAL HIGH (ref 4.0–10.5)

## 2013-09-05 LAB — PRO B NATRIURETIC PEPTIDE: Pro B Natriuretic peptide (BNP): 909.6 pg/mL — ABNORMAL HIGH (ref 0–125)

## 2013-09-05 LAB — PROCALCITONIN: PROCALCITONIN: 11.21 ng/mL

## 2013-09-05 LAB — PHOSPHORUS: PHOSPHORUS: 5.3 mg/dL — AB (ref 2.3–4.6)

## 2013-09-05 LAB — MAGNESIUM: MAGNESIUM: 2 mg/dL (ref 1.5–2.5)

## 2013-09-05 MED ORDER — FAT EMULSION 20 % IV EMUL
240.0000 mL | INTRAVENOUS | Status: AC
  Administered 2013-09-05: 240 mL via INTRAVENOUS
  Filled 2013-09-05: qty 250

## 2013-09-05 MED ORDER — POTASSIUM CHLORIDE 10 MEQ/100ML IV SOLN
10.0000 meq | INTRAVENOUS | Status: AC
  Administered 2013-09-05 (×2): 10 meq via INTRAVENOUS
  Filled 2013-09-05 (×2): qty 100

## 2013-09-05 MED ORDER — DILTIAZEM HCL 100 MG IV SOLR
5.0000 mg/h | INTRAVENOUS | Status: DC
  Administered 2013-09-05: 5 mg/h via INTRAVENOUS
  Filled 2013-09-05: qty 100

## 2013-09-05 MED ORDER — TRACE MINERALS CR-CU-F-FE-I-MN-MO-SE-ZN IV SOLN
INTRAVENOUS | Status: AC
  Administered 2013-09-05: 18:00:00 via INTRAVENOUS
  Filled 2013-09-05: qty 3000

## 2013-09-05 MED ORDER — IOHEXOL 300 MG/ML  SOLN
25.0000 mL | INTRAMUSCULAR | Status: AC
  Administered 2013-09-05 (×2): 25 mL via ORAL

## 2013-09-05 NOTE — Progress Notes (Signed)
Upon emptying JP drains, it was noted that the fluid appears to contain mucous and is milky-brown in appearance. This is a new finding. PA Unknown JimKelly Osbourne paged to make aware, she will round on patient to assess. Will monitor.   Subsequently, the patient converted into SVT with a HR sustaining in the 150's. MD Ramaswamy paged to make aware. Order for cardizem drip received. Will monitor  James Mccormick, James Mccormick

## 2013-09-05 NOTE — Progress Notes (Signed)
8 Days Post-Op  Subjective: On vent back on 1 pressor   Objective: Vital signs in last 24 hours: Temp:  [98.7 F (37.1 C)-99.6 F (37.6 C)] 99.6 F (37.6 C) (01/16 0719) Pulse Rate:  [34-125] 104 (01/16 0700) Resp:  [34] 34 (01/16 0700) BP: (83-184)/(41-97) 109/58 mmHg (01/16 0700) SpO2:  [89 %-96 %] 95 % (01/16 0700) Arterial Line BP: (84-175)/(47-78) 118/53 mmHg (01/16 0700) FiO2 (%):  [40 %-50 %] 40 % (01/16 0600) Weight:  [279 lb 5.2 oz (126.7 kg)] 279 lb 5.2 oz (126.7 kg) (01/16 0600) Last BM Date:  (unknown)  Intake/Output from previous day: 01/15 0701 - 01/16 0700 In: 6152.5 [I.V.:2212.5; NG/GT:90; IV Piggyback:1100; TPN:2750] Out: 4910 [Urine:4310; Emesis/NG output:600] Intake/Output this shift:    Incision/Wound:clean intact. Drains minimal output  Lab Results:   Recent Labs  09/04/13 0358 09/05/13 0500  WBC 12.9* 10.8*  HGB 11.7* 11.2*  HCT 35.6* 33.9*  PLT 152 204   BMET  Recent Labs  09/04/13 0358 09/05/13 0500  NA 140 139  K 3.6* 3.6*  CL 101 102  CO2 25 24  GLUCOSE 125* 113*  BUN 29* 31*  CREATININE 1.38* 1.57*  CALCIUM 7.6* 7.4*   PT/INR  Recent Labs  09/04/13 0358  LABPROT 13.4  INR 1.04   ABG  Recent Labs  09/04/13 0538 09/05/13 0433  PHART 7.345* 7.332*  HCO3 28.0* 28.1*    Studies/Results: Dg Chest Port 1 View  09/05/2013   CLINICAL DATA:  48 year old male with sepsis, ARDS. Intubated. Initial encounter.  EXAM: PORTABLE CHEST - 1 VIEW  COMPARISON:  09/04/2013 and earlier.  FINDINGS: Portable AP semi upright view at 0550 hrs. Stable endotracheal tube tip at the level the clavicles. Enteric tube courses to the abdomen, tip not included. Stable right IJ central line.  Mildly larger lung volume with decreased confluence of opacity in the left upper lung. Otherwise stable widespread pulmonary opacity with probable bilateral pleural effusions. Stable cardiac size and mediastinal contours.  IMPRESSION: 1.  Stable lines and tubes.  2. Mildly improved ventilation, more so in the left lung. Underlying widespread opacity compatible with ARDS, and probable pleural effusions.   Electronically Signed   By: Augusto Gamble M.D.   On: 09/05/2013 07:34   Dg Chest Port 1 View  09/04/2013   CLINICAL DATA:  Pulmonary edema.  EXAM: PORTABLE CHEST - 1 VIEW  COMPARISON:  09/03/2013.  FINDINGS: Endotracheal tube, right IJ line , NG tube in stable position. Dense persistent bilateral pulmonary alveolar infiltrates are present. These are unchanged. Cardiac silhouette is difficult to evaluate due to dense pulmonary infiltrates. No pleural effusion identified. Left costophrenic angle is not imaged.  IMPRESSION: 1. Stable line and tube positions. 2. Dense bilateral unchanged pulmonary infiltrates. Differential diagnosis includes cardiogenic and noncardiogenic pulmonary edema including ARDS. Bilateral severe pneumonia could also present in this fashion .   Electronically Signed   By: Maisie Fus  Register   On: 09/04/2013 07:44    Anti-infectives: Anti-infectives   Start     Dose/Rate Route Frequency Ordered Stop   09/03/13 1100  imipenem-cilastatin (PRIMAXIN) 500 mg in sodium chloride 0.9 % 100 mL IVPB     500 mg 200 mL/hr over 30 Minutes Intravenous Every 6 hours 09/03/13 1007     09/02/13 1300  vancomycin (VANCOCIN) IVPB 1000 mg/200 mL premix     1,000 mg 200 mL/hr over 60 Minutes Intravenous Every 12 hours 09/01/13 1527     08/30/13 2000  vancomycin (VANCOCIN) IVPB 750  mg/150 ml premix  Status:  Discontinued     750 mg 150 mL/hr over 60 Minutes Intravenous Every 8 hours 08/30/13 1356 09/01/13 1527   08/29/13 2200  micafungin (MYCAMINE) 100 mg in sodium chloride 0.9 % 100 mL IVPB     100 mg 100 mL/hr over 1 Hours Intravenous Every 24 hours 08/29/13 2036     08/29/13 0400  vancomycin (VANCOCIN) IVPB 1000 mg/200 mL premix  Status:  Discontinued     1,000 mg 200 mL/hr over 60 Minutes Intravenous Every 8 hours 08/28/13 2358 08/30/13 1356   08/29/13 0400   piperacillin-tazobactam (ZOSYN) IVPB 3.375 g  Status:  Discontinued     3.375 g 12.5 mL/hr over 240 Minutes Intravenous 3 times per day 08/28/13 2358 09/03/13 1002   08/28/13 1845  [MAR Hold]  vancomycin (VANCOCIN) IVPB 1000 mg/200 mL premix     (On MAR Hold since 08/28/13 1957)   1,000 mg 200 mL/hr over 60 Minutes Intravenous  Once 08/28/13 1831 08/28/13 1955   08/28/13 1845  [MAR Hold]  piperacillin-tazobactam (ZOSYN) IVPB 3.375 g     (On MAR Hold since 08/28/13 1957)   3.375 g 100 mL/hr over 30 Minutes Intravenous  Once 08/28/13 1831 08/28/13 2056      Assessment/Plan: s/p Procedure(s): EXPLORATORY LAPAROTOMY,debridment of nacrotic stomach,and primary repair of perforated stomach. (N/A) Continue supportive care.  Will need UGI once stable ARDS per CCM Don't feel CT warranted at this point.   LOS: 8 days    Jensine Luz A. 09/05/2013

## 2013-09-05 NOTE — Progress Notes (Signed)
Late entry 08/29/13 2315 elink md (Dr deterding)  notified of nahco3 drip run through a piv and reguested to stop tna and run nahco3 drip centerally due to limited central access.  MD to continue nahco3  [facility]

## 2013-09-05 NOTE — Progress Notes (Signed)
eLink Physician-Brief Progress Note Patient Name: James LeydenChristopher D Mccormick DOB: 10/06/1965 MRN: [id number]  Date of Service  09/05/2013   HPI/Events of Note   Pt was in afib rvr, placed on dilt drip, now in shock  eICU Interventions  Stop dilt drip. Restart neo drip   Intervention Category Major Interventions: Shock - evaluation and management  Shan Levansatrick Keilee Denman 09/05/2013, 4:08 PM

## 2013-09-05 NOTE — Progress Notes (Signed)
RN calling paging me  HR 150 and SVT  Rx cardizem gttt  Dr. Kalman ShanMurali Ly Wass, M.D., [facility]P Pulmonary and Critical Care Medicine Staff Physician [facility] [place] [facility] Pager: [phone], If no answer or between  15:00h - 7:00h: call [phone]  09/05/2013 12:14 PM

## 2013-09-05 NOTE — Progress Notes (Signed)
Name: James Mccormick MRN: [id number] DOB: 25-May-1966    ADMISSION DATE:  08/28/2013 CONSULTATION DATE:  08/28/13 LOS 8 days   REFERRING MD :  Andrey Campanile PRIMARY SERVICE: CCS  CHIEF COMPLAINT:  Abdominal Pain  BRIEF PATIENT DESCRIPTION:  48 yo male smoker presented with abdominal pain from perforated gastric ulcer in setting of NSAID.  PCCM consulted to assist with septic shock and respiratory failure management.  SIGNIFICANT EVENTS: 1/8 Exploratory laparotomy, debridement of necrotic stomach with scissors, primary repair of perforated stomach in 2 layers.  Septic shock, VDRF, a fib post-op. 1/9 Cardiology consulted . ECHO EF 45% 1/11: still pressor dependent, NSR for > 24 hrs. Amiodarone stopped.  1/12: remain son levophed,. Left radial a line dc'ed 09/02/13: off levophed. Encephalopathic - randomly agitated. Mmit 08/28/2013 . TPN continuesoving around, biting tube, looks randomly, does not track. Failed SBT. Febrile. + 24 Liters. Back on neo 09/03/13: ARDS Protocol with 48h nimbex started. Left radial artery occluded - seen by [facility]. Expectant mgmt. Still on neo 1/15: 50% fio2 on ARDS protocol with nimbex. LEft radial artery occlusion events noted. CCS considering CT abd v UGI scope. Fever curve improving after abx change 24h ago   STUDIES:  1/8 CT abd/pelvis >> pneumoperitoneum, Lt kidney stone, diverticulosis 1/9 Echo >> mild LVH, EF 40 to 45%, grade 1 diastolic dysfx  LINES / TUBES: 1/8 ETT >>  1/8 Rt IJ CVL >> 1/8 Lt radial aline >>>1/12 1/14 - Rt radial aline>>  CULTURES: Blood 1/9 >> .Marland Kitchen... Tracheal aspirate 1/16>>    ANTIBIOTICS: Vancomycin 1/8 >>>1/14 Zosyn 1/8 >>>>1/14 Micafungin 1/9 >>>> Imipenem 1/14 >>     SUBJECTIVE:  1/16: fever curve improved after abx change 48h ago. Lasix continues. Off neo. On ARDS protocol; now 48h nimbex with 40% fio2/peep 8. RN reports brown ET tube secretions  VITAL SIGNS: Temp:  [98.7 F (37.1 C)-99.6 F (37.6 C)] 99.6 F  (37.6 C) (01/16 0719) Pulse Rate:  [34-124] 115 (01/16 0900) Resp:  [0-34] 0 (01/16 0900) BP: (83-184)/(41-97) 125/61 mmHg (01/16 0900) SpO2:  [90 %-96 %] 95 % (01/16 0900) Arterial Line BP: (84-206)/(47-94) 149/55 mmHg (01/16 0900) FiO2 (%):  [40 %-50 %] 40 % (01/16 0800) Weight:  [126.7 kg (279 lb 5.2 oz)] 126.7 kg (279 lb 5.2 oz) (01/16 0600) HEMODYNAMICS: CVP:  [10 mmHg-16 mmHg] 16 mmHg VENTILATOR SETTINGS: Vent Mode:  [-] PRVC FiO2 (%):  [40 %-50 %] 40 % Set Rate:  [0.4 bmp-34 bmp] 34 bmp Vt Set:  [450 mL] 450 mL PEEP:  [8 cmH20-10 cmH20] 8 cmH20 Plateau Pressure:  [21 cmH20-34 cmH20] 32 cmH20 INTAKE / OUTPUT: Intake/Output     01/15 0701 - 01/16 0700 01/16 0701 - 01/17 0700   I.V. (mL/kg) 2212.5 (17.5) 180.2 (1.4)   NG/GT 90 30   IV Piggyback 1100    TPN 2750 220   Total Intake(mL/kg) 6152.5 (48.6) 430.2 (3.4)   Urine (mL/kg/hr) 4310 (1.4) 500 (1.6)   Emesis/NG output 600 (0.2)    Drains     Total Output 4910 500   Net +1242.5 -69.8          PHYSICAL EXAMINATION: General: no distress, heavily sedated and paralyzed. Critically ill Neuro:  RASS -4, BIS 31 on fent and precedex HEENT: ETT in place Cardiovascular: s1 s2 SR no r/g. On neo due to precedex Lungs: coarse bilat Abdomen: wound dressing clean, drains w/ ser/sang drainage, abd distended, hypoactive   Musculoskeletal: 2+ edema, gen. Left Trickey forearm discoloaration? Due  to IV extravasation Skin: lower extremities cool  LABS: PULMONARY  Recent Labs Lab 09/03/13 1513 09/04/13 0235 09/04/13 0423 09/04/13 0538 09/05/13 0433  PHART 7.314* 7.315* 7.316* 7.345* 7.332*  PCO2ART 49.3* 52.2* 53.6* 51.6* 53.4*  PO2ART 48.0* 72.0* 79.0* 62.0* 64.0*  HCO3 25.1* 26.5* 27.2* 28.0* 28.1*  TCO2 27 28 29 30 30   O2SAT 80.0 92.0 94.0 89.0 89.0    CBC  Recent Labs Lab 09/03/13 0420 09/04/13 0358 09/05/13 0500  HGB 11.9* 11.7* 11.2*  HCT 35.3* 35.6* 33.9*  WBC 9.9 12.9* 10.8*  PLT 121* 152 204     COAGULATION  Recent Labs Lab 09/04/13 0358  INR 1.04    CARDIAC    Recent Labs Lab 08/29/13 1933 08/30/13 0256 08/30/13 0638 09/03/13 0420  TROPONINI <0.30 <0.30 1.22* <0.30    Recent Labs Lab 09/03/13 0420 09/05/13 0500  PROBNP 358.1* 909.6*     CHEMISTRY  Recent Labs Lab 08/31/13 1824 09/01/13 0525 09/02/13 0425 09/03/13 0420 09/04/13 0358 09/05/13 0500  NA 129* 136* 143 139 140 139  K 3.9 3.5* 3.7 3.3* 3.6* 3.6*  CL 94* 101 108 102 101 102  CO2 22 24 26 24 25 24   GLUCOSE 120* 139* 97 123* 125* 113*  BUN 37* 32* 26* 27* 29* 31*  CREATININE 1.40* 1.29 1.34 1.41* 1.38* 1.57*  CALCIUM 6.5* 7.0* 7.1* 7.6* 7.6* 7.4*  MG 1.7 2.0  --  1.8 1.9 2.0  PHOS 2.4 1.9* 2.2* 2.5 5.1* 5.3*   Estimated Creatinine Clearance: 77.2 ml/min (by C-G formula based on Cr of 1.57).   LIVER  Recent Labs Lab 08/30/13 0638 08/30/13 1147 08/31/13 0330 09/01/13 0525 09/04/13 0358  AST 79* 86* 97* 54* 28  ALT 41 45 61* 48 21  ALKPHOS 34* 38* 43 50 62  BILITOT 0.3 0.3 0.5 0.7 1.0  PROT 3.9* 4.1* 4.2* 4.7* 4.9*  ALBUMIN 1.8* 1.9* 1.8* 1.7* 1.4*  INR  --   --   --   --  1.04     INFECTIOUS  Recent Labs Lab 08/29/13 1821  09/03/13 0421 09/03/13 0959 09/04/13 0358 09/05/13 0500  LATICACIDVEN 4.1*  --  1.6  --  1.4  --   PROCALCITON  --   < >  --  24.32 15.05 11.21  < > = values in this interval not displayed.   ENDOCRINE CBG (last 3)   Recent Labs  09/04/13 1836 09/04/13 2331 09/05/13 0545  GLUCAP 101* 124* 108*         IMAGING x48h  Dg Chest Port 1 View  09/05/2013   CLINICAL DATA:  48 year old male with sepsis, ARDS. Intubated. Initial encounter.  EXAM: PORTABLE CHEST - 1 VIEW  COMPARISON:  09/04/2013 and earlier.  FINDINGS: Portable AP semi upright view at 0550 hrs. Stable endotracheal tube tip at the level the clavicles. Enteric tube courses to the abdomen, tip not included. Stable right IJ central line.  Mildly larger lung volume with  decreased confluence of opacity in the left upper lung. Otherwise stable widespread pulmonary opacity with probable bilateral pleural effusions. Stable cardiac size and mediastinal contours.  IMPRESSION: 1.  Stable lines and tubes. 2. Mildly improved ventilation, more so in the left lung. Underlying widespread opacity compatible with ARDS, and probable pleural effusions.   Electronically Signed   By: Augusto GambleLee  Hall M.D.   On: 09/05/2013 07:34   Dg Chest Port 1 View  09/04/2013   CLINICAL DATA:  Pulmonary edema.  EXAM: PORTABLE CHEST - 1 VIEW  COMPARISON:  09/03/2013.  FINDINGS: Endotracheal tube, right IJ line , NG tube in stable position. Dense persistent bilateral pulmonary alveolar infiltrates are present. These are unchanged. Cardiac silhouette is difficult to evaluate due to dense pulmonary infiltrates. No pleural effusion identified. Left costophrenic angle is not imaged.  IMPRESSION: 1. Stable line and tube positions. 2. Dense bilateral unchanged pulmonary infiltrates. Differential diagnosis includes cardiogenic and noncardiogenic pulmonary edema including ARDS. Bilateral severe pneumonia could also present in this fashion .   Electronically Signed   By: Maisie Fus  Register   On: 09/04/2013 07:44       ASSESSMENT / PLAN:  PULMONARY A:  Acute respiratory failure in setting of peritonitis Admissions asp? RLL ATX Bibasilar atx, probable sympathetic effusions 1/11 Hx of smoking >> air trapping on vent 1/10.  09/04/13: Severe ARDS persists but some better perhaps. 48h nimbex complete. RN unable to wean nimbex due to dysnchnrony  PLAN  -Full vent support - Contniue ARDS PROTOCOL  - Continue Nimbex - 48h have elapsed on 09/05/13 but reassess 09/06/13 due to dysnchrony  CARDIAC A:  Septic shock 2nd to peritonitis. A fib with RVR, resolved, SR for > 24hrs // s/p IV amio 08/29/13 through 08/30/13 Acute systolic heart failure likely 2nd to sepsis. NSTEMI from demand ischemia. Hx of HTN with chronic  diastolic dysfx. ECHO 1/19 with systolic CHF  09/02/13: + 24L since admit 08/28/2013. CVP 11. 3rd spacing + 09/05/13: +14L x 7 days with lasix. Creat rising. 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 Seen by VVS P  - expectant mgmt  RENAL A:   Acute kidney injury 2nd to hypovolemia, and ATN in setting of shock. He has had some marginal bump in scr but hoping that this reflects plateau  Metabolic acidosis >> improved.  09/05/13: Mildly worse creat.  Low K  P:   - replete K  - monitopr creat with lasix  - reduce IV fluids to saline kvo  GASTROINTESTINAL A:   Perforated gastric ulcer in setting of NSAID use as outpt. Protein calorie malnutrition.  09/04/13: CCS considering UGI scope and CT abd when he is more stable  P:   -post -op care per CCS -protonix BID -continue TNA per CCS - CT abd when stable  HEMATOLOGIC A:   Anemia of critical illness Low platelet, HIT negative 09/02/13. Likely due to sepsis   P:  -f/u CBC in am , PRBC for hgb < 7gm% only -SQ lovenox for DVT prevention   INFECTIOUS A:  Septic shock 2nd to peritonitis.   09/05/13: Off pressors today. Afebrile x 24-48h. Brown ET tube secretions  P:   -continue vancomycin,  micafungin with limited pressor imrpovement - Continue Imipenem -drain management per surg -no role pct - CT abd when stable per CCS  ENDOCRINE A:   Hyperglycemia. P:   -SSI -at risk burn out adrenal insuff, repeat cortisol in 2 days if not improved   NEUROLOGIC A:   Acute encephalopathy 2nd to septic shock.  09/05/13: Deeply sedated and  Paralyzed x 48h due to severe ARDS. BIS 31  P:   - deep sedation with fent/versed due top ARDS - reassess nimbex need at 72h on 09/06/13 -goal RASS -4    GLOBAL 09/02/13: Tammy the significant other updated briefly  at bedside with RN present . She did not have any questions.   09/03/13:  no family at bedside.  Sister discussed in detail over phone. Sister says she will be primary contat. Daughter to be secondary contact. No details other than brief updates to significant other. Explained ARDS. Mom died recently in ICU with similar issues so sister familiar with concepts of ICU. Says she is realistic that this will take weeks or months. Understands acutely uill and high mortality situaton  09/04/13: No change. No family at bedside. Discussed plan with Dr [name] from bedside   09/05/13: No family at bedside  The patient is critically ill with multiple organ systems failure and requires high complexity decision making for assessment and support, frequent evaluation and titration of therapies, application of advanced monitoring technologies and extensive interpretation of multiple databases.   Critical Care Time devoted to patient care services described in this note is  45  Minutes.    Dr. Kalman Shan, M.D., [facility].C.P Pulmonary and Critical Care Medicine Staff Physician [facility] [place] [facility] Critical [facility] Pager: [phone], If no answer or between  15:00h - 7:00h: call [phone]  09/05/2013 9:28 AM

## 2013-09-05 NOTE — Progress Notes (Addendum)
PARENTERAL NUTRITION CONSULT NOTE - FOLLOW UP  Pharmacy Consult for TPN Indication: Prolonged ileus  No Known Allergies  Patient Measurements: Height: 5\' 11"  (180.3 cm) Weight: 279 lb 5.2 oz (126.7 kg) IBW/kg (Calculated) : 75.3 Vital Signs: Temp: 99.6 F (37.6 C) (01/16 0719) Temp src: Oral (01/16 0719) BP: 109/58 mmHg (01/16 0700) Pulse Rate: 104 (01/16 0700) Intake/Output from previous day: 01/15 0701 - 01/16 0700 In: 6152.5 [I.V.:2212.5; NG/GT:90; IV Piggyback:1100; TPN:2750] Out: 4910 [Urine:4310; Emesis/NG output:600] Intake/Output from this shift:    Labs:  Recent Labs  09/03/13 0420 09/04/13 0358 09/05/13 0500  WBC 9.9 12.9* 10.8*  HGB 11.9* 11.7* 11.2*  HCT 35.3* 35.6* 33.9*  PLT 121* 152 204  INR  --  1.04  --      Recent Labs  09/03/13 0420 09/04/13 0358 09/05/13 0500  NA 139 140 139  K 3.3* 3.6* 3.6*  CL 102 101 102  CO2 24 25 24   GLUCOSE 123* 125* 113*  BUN 27* 29* 31*  CREATININE 1.41* 1.38* 1.57*  CALCIUM 7.6* 7.6* 7.4*  MG 1.8 1.9 2.0  PHOS 2.5 5.1* 5.3*  PROT  --  4.9*  --   ALBUMIN  --  1.4*  --   AST  --  28  --   ALT  --  21  --   ALKPHOS  --  62  --   BILITOT  --  1.0  --    Estimated Creatinine Clearance: 77.2 ml/min (by C-G formula based on Cr of 1.57).    Recent Labs  09/04/13 1836 09/04/13 2331 09/05/13 0545  GLUCAP 101* 124* 108*    Insulin Requirements in the past 24 hours:  1 units Novolog ICU-SSI and 15 units/L in TPN,   Current Nutrition:  NPO   Assessment:  48 y.o. male presented to ED 1/8 with abd pain. CT consistent with perforated stomach ulcer due to NSAID use; pneumoperitoneum; peritonitis. Taken emergently to OR where he underwent exp lap, debridement of necrotic stomach, repair of perforated stomach. Pt must be strict NPO until contrast study can be done to eval gastric repair 1/15. TPN started in the interim.  Possible radial artery occlusion, no surgery planned  GI: NPO.  Hypoactive BS, wounds OK   Prealb 4.7; paralyzed on vent  Endo: No h/o DM. CBGs all < 150 as desired Lytes: Na 139, K 3.6 after 3 runs of K and lytes removed from TPN yesterday, Phos 5.3 after removed from TPN yesterday, mag 2 after 2 gm bolus and removed from TPN yesterday;  Corr Ca 9.48  CoCa X phos = 50 - acceptable Renal: Cr  Up to 1.57   UOP 1.4 ml/Kg/hr Hepatobil: LFTs wnl   TG 250 TPN Access: CVC 08/29/13  TPN day#: 7  Nutritional Goals:  Goal 2500 kcal and 150-160gm protein per RD recommendations 09/02/13.  Unlikely to meet protein requirements with premixed clinimix formula  Plan:  1. Add back electrolytes.  Change to Clinimix E 5/15.  Continue lipids at 10 ml/hr.  2. Increase rate to 120 ml/hr to provide 144 gm protein and 2525 Kcals. This provides 96% minimum protein goals and 100 % Kcal goals. 3. Insulin 20 units/ 3L ~ 15 units/2.4 L = keep same insulin  4. Am Bmet, mag, phos already ordered daily Herby AbrahamMichelle T. Raymone Pembroke, Pharm.D. [phone] 09/05/2013 8:20 AM

## 2013-09-05 NOTE — Progress Notes (Signed)
FiO2 increased to 100% per MD prior  to transport to CT to prepare pt for moving from ICU bed to CT table. RT will continue to monitor.

## 2013-09-06 ENCOUNTER — Inpatient Hospital Stay (HOSPITAL_COMMUNITY): Payer: Medicaid Other

## 2013-09-06 LAB — CBC WITH DIFFERENTIAL/PLATELET
Basophils Absolute: 0.1 10*3/uL (ref 0.0–0.1)
Basophils Relative: 1 % (ref 0–1)
Eosinophils Absolute: 0.4 10*3/uL (ref 0.0–0.7)
Eosinophils Relative: 3 % (ref 0–5)
HEMATOCRIT: 33.7 % — AB (ref 39.0–52.0)
Hemoglobin: 11.2 g/dL — ABNORMAL LOW (ref 13.0–17.0)
Lymphocytes Relative: 12 % (ref 12–46)
Lymphs Abs: 1.5 10*3/uL (ref 0.7–4.0)
MCH: 30.6 pg (ref 26.0–34.0)
MCHC: 33.2 g/dL (ref 30.0–36.0)
MCV: 92.1 fL (ref 78.0–100.0)
MONOS PCT: 9 % (ref 3–12)
Monocytes Absolute: 1.1 10*3/uL — ABNORMAL HIGH (ref 0.1–1.0)
NEUTROS ABS: 9.1 10*3/uL — AB (ref 1.7–7.7)
Neutrophils Relative %: 75 % (ref 43–77)
Platelets: 242 10*3/uL (ref 150–400)
RBC: 3.66 MIL/uL — ABNORMAL LOW (ref 4.22–5.81)
RDW: 15.3 % (ref 11.5–15.5)
WBC: 12.2 10*3/uL — AB (ref 4.0–10.5)

## 2013-09-06 LAB — GLUCOSE, CAPILLARY
GLUCOSE-CAPILLARY: 109 mg/dL — AB (ref 70–99)
GLUCOSE-CAPILLARY: 109 mg/dL — AB (ref 70–99)
GLUCOSE-CAPILLARY: 139 mg/dL — AB (ref 70–99)
Glucose-Capillary: 111 mg/dL — ABNORMAL HIGH (ref 70–99)
Glucose-Capillary: 117 mg/dL — ABNORMAL HIGH (ref 70–99)
Glucose-Capillary: 160 mg/dL — ABNORMAL HIGH (ref 70–99)

## 2013-09-06 LAB — BASIC METABOLIC PANEL
BUN: 40 mg/dL — ABNORMAL HIGH (ref 6–23)
CHLORIDE: 98 meq/L (ref 96–112)
CO2: 25 mEq/L (ref 19–32)
Calcium: 7.7 mg/dL — ABNORMAL LOW (ref 8.4–10.5)
Creatinine, Ser: 2.01 mg/dL — ABNORMAL HIGH (ref 0.50–1.35)
GFR calc Af Amer: 43 mL/min — ABNORMAL LOW (ref >90)
GFR calc non Af Amer: 37 mL/min — ABNORMAL LOW (ref >90)
Glucose, Bld: 120 mg/dL — ABNORMAL HIGH (ref 70–99)
POTASSIUM: 4 meq/L (ref 3.7–5.3)
Sodium: 137 mEq/L (ref 137–147)

## 2013-09-06 LAB — POCT I-STAT 3, ART BLOOD GAS (G3+)
Bicarbonate: 26.8 mEq/L — ABNORMAL HIGH (ref 20.0–24.0)
O2 Saturation: 93 %
Patient temperature: 100.7
TCO2: 28 mmol/L (ref 0–100)
pCO2 arterial: 51.7 mmHg — ABNORMAL HIGH (ref 35.0–45.0)
pH, Arterial: 7.328 — ABNORMAL LOW (ref 7.350–7.450)
pO2, Arterial: 79 mmHg — ABNORMAL LOW (ref 80.0–100.0)

## 2013-09-06 LAB — PHOSPHORUS: Phosphorus: 5.5 mg/dL — ABNORMAL HIGH (ref 2.3–4.6)

## 2013-09-06 LAB — PRO B NATRIURETIC PEPTIDE: Pro B Natriuretic peptide (BNP): 986.2 pg/mL — ABNORMAL HIGH (ref 0–125)

## 2013-09-06 LAB — CORTISOL: Cortisol, Plasma: 14.7 ug/dL

## 2013-09-06 LAB — MAGNESIUM: Magnesium: 2.1 mg/dL (ref 1.5–2.5)

## 2013-09-06 LAB — POTASSIUM: POTASSIUM: 4.3 meq/L (ref 3.7–5.3)

## 2013-09-06 MED ORDER — FUROSEMIDE 10 MG/ML IJ SOLN
5.0000 mg/h | INTRAVENOUS | Status: AC
  Administered 2013-09-06: 5 mg/h via INTRAVENOUS
  Filled 2013-09-06: qty 25

## 2013-09-06 MED ORDER — SODIUM CHLORIDE 0.9 % IV SOLN
500.0000 mg | Freq: Three times a day (TID) | INTRAVENOUS | Status: DC
  Administered 2013-09-06 – 2013-09-12 (×18): 500 mg via INTRAVENOUS
  Filled 2013-09-06 (×20): qty 500

## 2013-09-06 MED ORDER — TRACE MINERALS CR-CU-F-FE-I-MN-MO-SE-ZN IV SOLN
INTRAVENOUS | Status: AC
  Administered 2013-09-06: 18:00:00 via INTRAVENOUS
  Filled 2013-09-06: qty 3000

## 2013-09-06 MED ORDER — HYDROCORTISONE SOD SUCCINATE 100 MG IJ SOLR
50.0000 mg | Freq: Four times a day (QID) | INTRAMUSCULAR | Status: DC
  Administered 2013-09-06 – 2013-09-08 (×9): 50 mg via INTRAVENOUS
  Filled 2013-09-06 (×13): qty 1

## 2013-09-06 MED ORDER — FAT EMULSION 20 % IV EMUL
240.0000 mL | INTRAVENOUS | Status: AC
  Administered 2013-09-06: 240 mL via INTRAVENOUS
  Filled 2013-09-06: qty 250

## 2013-09-06 NOTE — Progress Notes (Signed)
ANTIBIOTIC CONSULT NOTE - Follow-up  Pharmacy Consult for primaxin, vancomycin, micafungin Indication: empiric lung and abdominal coverage  No Known Allergies  Patient Measurements: Height: 5\' 11"  (180.3 cm) Weight: 278 lb (126.1 kg) IBW/kg (Calculated) : 75.3  Vital Signs: Temp: 101.9 F (38.8 C) (01/17 1156) Temp src: Oral (01/17 1156) BP: 122/65 mmHg (01/17 1255) Pulse Rate: 100 (01/17 1255) Intake/Output from previous day: 01/16 0701 - 01/17 0700 In: 5928.5 [I.V.:2028.5; NG/GT:120; IV Piggyback:900; TPN:2880] Out: 4305 [Urine:3725; Emesis/NG output:520; Drains:60] Intake/Output from this shift: Total I/O In: 1099 [I.V.:319; NG/GT:30; IV Piggyback:100; TPN:650] Out: 635 [Urine:605; Drains:30]  Labs:  Recent Labs  09/04/13 0358 09/05/13 0500 09/06/13 0408  WBC 12.9* 10.8* 12.2*  HGB 11.7* 11.2* 11.2*  PLT 152 204 242  CREATININE 1.38* 1.57* 2.01*   Estimated Creatinine Clearance: 60.1 ml/min (by C-G formula based on Cr of 2.01).  Recent Labs  09/04/13 1115  VANCOTROUGH 18.6     Microbiology: Recent Results (from the past 720 hour(s))  CULTURE, BLOOD (ROUTINE X 2)     Status: None   Collection Time    08/29/13 12:35 AM      Result Value Range Status   Specimen Description BLOOD RIGHT [place]   Final   Special Requests BOTTLES DRAWN AEROBIC ONLY 8CC   Final   Culture  Setup Time     Final   Value: 08/30/2013 05:14     Performed at [facility]   Culture     Final   Value: NO GROWTH 5 DAYS     Performed at [facility]   Report Status 09/05/2013 FINAL   Final  MRSA PCR SCREENING     Status: None   Collection Time    08/29/13  7:55 AM      Result Value Range Status   MRSA by PCR NEGATIVE  NEGATIVE Final   Comment:            The GeneXpert MRSA Assay (FDA     approved for NASAL specimens     only), is one component of a     comprehensive MRSA colonization     surveillance program. It is not     intended to diagnose MRSA     infection  nor to guide or     monitor treatment for     MRSA infections.  CULTURE, BLOOD (ROUTINE X 2)     Status: None   Collection Time    08/29/13 11:19 PM      Result Value Range Status   Specimen Description BLOOD RIGHT FOOT   Final   Special Requests BOTTLES DRAWN AEROBIC ONLY 0.5CC   Final   Culture  Setup Time     Final   Value: 08/30/2013 05:13     Performed at [facility]   Culture     Final   Value: NO GROWTH 5 DAYS     Performed at [facility]   Report Status 09/05/2013 FINAL   Final  CULTURE, RESPIRATORY (NON-EXPECTORATED)     Status: None   Collection Time    09/05/13 10:15 AM      Result Value Range Status   Specimen Description TRACHEAL ASPIRATE   Final   Special Requests Normal   Final   Gram Stain     Final   Value: NO WBC SEEN     NO SQUAMOUS EPITHELIAL CELLS SEEN     NO ORGANISMS SEEN     Performed at [facility]   Culture PENDING   Incomplete   Report Status PENDING   Incomplete    Medications:  Anti-infectives   Start     Dose/Rate Route Frequency Ordered Stop   09/03/13 1100  imipenem-cilastatin (PRIMAXIN) 500 mg in sodium chloride 0.9 % 100 mL IVPB     500 mg 200 mL/hr over 30 Minutes Intravenous Every 6 hours 09/03/13 1007     09/02/13 1300  vancomycin (VANCOCIN) IVPB 1000 mg/200 mL premix     1,000 mg 200 mL/hr over 60 Minutes Intravenous Every 12 hours 09/01/13 1527     08/30/13 2000  vancomycin (VANCOCIN) IVPB 750 mg/150 ml premix  Status:  Discontinued     750 mg 150 mL/hr over 60 Minutes Intravenous Every 8 hours 08/30/13 1356 09/01/13 1527   08/29/13 2200  micafungin (MYCAMINE) 100 mg in sodium chloride 0.9 % 100 mL IVPB     100 mg 100 mL/hr over 1 Hours Intravenous Every 24 hours 08/29/13 2036     08/29/13 0400  vancomycin (VANCOCIN) IVPB 1000 mg/200 mL premix  Status:  Discontinued     1,000 mg 200 mL/hr over 60 Minutes Intravenous Every 8 hours 08/28/13 2358 08/30/13 1356   08/29/13 0400  piperacillin-tazobactam  (ZOSYN) IVPB 3.375 g  Status:  Discontinued     3.375 g 12.5 mL/hr over 240 Minutes Intravenous 3 times per day 08/28/13 2358 09/03/13 1002   08/28/13 1845  [MAR Hold]  vancomycin (VANCOCIN) IVPB 1000 mg/200 mL premix     (On MAR Hold since 08/28/13 1957)   1,000 mg 200 mL/hr over 60 Minutes Intravenous  Once 08/28/13 1831 08/28/13 1955   08/28/13 1845  [MAR Hold]  piperacillin-tazobactam (ZOSYN) IVPB 3.375 g     (On MAR Hold since 08/28/13 1957)   3.375 g 100 mL/hr over 30 Minutes Intravenous  Once 08/28/13 1831 08/28/13 2056     Assessment: 47 yom presented to the hospital with abdominal pain. Now s/p debridement of necrotic stomach and repair of stomach perf. He has been on broad-spectrum antibiotics and antifungals since 1/8. His renal function has been fluctuating- today SCr was 2.01 with est CrCL ~4460mL/min. No growth on cultures  Goal of Therapy:  Eradication of infection  Plan:  1. Continue vancomycin 1gm IV Q12H 2. Will obtain trough tomorrow at 1230 with changing renal function (new steady state after last trough) 3. Change primaxin to 500mg  IV Q8H 4. Continue micafungin 100mg  IV Q24H 5. F/u renal fxn, C&S, clinical status and planning LOT  Anntonette Madewell D. Ozelle Brubacher, PharmD, BCPS Clinical Pharmacist Pager: [phone] 09/06/2013 1:09 PM

## 2013-09-06 NOTE — Progress Notes (Signed)
PARENTERAL NUTRITION CONSULT NOTE - FOLLOW UP  Pharmacy Consult for TPN Indication: Prolonged ileus  No Known Allergies  Patient Measurements: Height: 5\' 11"  (180.3 cm) Weight: 278 lb (126.1 kg) IBW/kg (Calculated) : 75.3 Vital Signs: Temp: 100.7 F (38.2 C) (01/17 0400) Temp src: Oral (01/17 0400) BP: 117/58 mmHg (01/17 0700) Pulse Rate: 97 (01/17 0700) Intake/Output from previous day: 01/16 0701 - 01/17 0700 In: 5928.5 [I.V.:2028.5; NG/GT:120; IV Piggyback:900; TPN:2880] Out: 4305 [Urine:3725; Emesis/NG output:520; Drains:60] Intake/Output from this shift:    Labs:  Recent Labs  09/04/13 0358 09/05/13 0500 09/06/13 0408  WBC 12.9* 10.8* 12.2*  HGB 11.7* 11.2* 11.2*  HCT 35.6* 33.9* 33.7*  PLT 152 204 242  INR 1.04  --   --      Recent Labs  09/04/13 0358 09/05/13 0500 09/06/13 0408  NA 140 139 137  K 3.6* 3.6* 4.0  CL 101 102 98  CO2 25 24 25   GLUCOSE 125* 113* 120*  BUN 29* 31* 40*  CREATININE 1.38* 1.57* 2.01*  CALCIUM 7.6* 7.4* 7.7*  MG 1.9 2.0 2.1  PHOS 5.1* 5.3* 5.5*  PROT 4.9*  --   --   ALBUMIN 1.4*  --   --   AST 28  --   --   ALT 21  --   --   ALKPHOS 62  --   --   BILITOT 1.0  --   --    Estimated Creatinine Clearance: 60.1 ml/min (by C-G formula based on Cr of 2.01).    Recent Labs  09/05/13 1726 09/05/13 2331 09/06/13 0341  GLUCAP 129* 109* 111*    Insulin Requirements in the past 24 hours:  4 units Novolog ICU-SSI and 15 units/L in TPN,   Current Nutrition:  NPO   Assessment:  48 y.o. male presented to ED 1/8 with abd pain. CT consistent with perforated stomach ulcer due to NSAID use; pneumoperitoneum; peritonitis. Taken emergently to OR where he underwent exp lap, debridement of necrotic stomach, repair of perforated stomach. Pt must be strict NPO until contrast study can be done to eval gastric repair 1/15. TPN started in the interim.  Possible radial artery occlusion, no surgery planned.   Neo drip restarted for shock    GI: NPO.  Hypoactive BS, drain now milky brown in appearance  Prealb 4.7 Endo: No h/o DM. CBGs all < 150 as desired Lytes: Na 137, K 4.0, Phos 5.5 , mag 2.1;  Corr Ca 9.78 CoCa X phos = 53.79   Will need to remove lytes from TPN today Renal: Cr  Up to 2.01   UOP 1.2 ml/Kg/hr Hepatobil: LFTs wnl   TG 250 TPN Access: CVC 08/29/13  TPN day#: 8  Nutritional Goals:  Goal 2500 kcal and 150-160gm protein per RD recommendations 09/02/13.  Unlikely to meet protein requirements with premixed clinimix formula  Plan:  1. Change to Clinimix  5/15 without electrolytes  Continue lipids at 10 ml/hr.  2. Increase rate to 120 ml/hr to provide 144 gm protein and 2525 Kcals. This provides 96% minimum protein goals and 100 % Kcal goals. 3. Insulin 20 units/ 3L ~ 15 units/2.4 L = keep same insulin  4.  Check a K level at 16:00 5. Am Bmet, mag, phos   HastingsLora Renna Kilmer, VermontPharm.D. [phone] 09/06/2013 7:28 AM

## 2013-09-06 NOTE — Progress Notes (Signed)
9 Days Post-Op  Subjective: No issues overnight  Objective: Vital signs in last 24 hours: Temp:  [98.9 F (37.2 C)-101.9 F (38.8 C)] 101.9 F (38.8 C) (01/17 1156) Pulse Rate:  [34-119] 104 (01/17 1400) Resp:  [0-34] 16 (01/17 1400) BP: (77-161)/(42-78) 125/63 mmHg (01/17 1400) SpO2:  [92 %-100 %] 94 % (01/17 1400) Arterial Line BP: (79-190)/(45-87) 115/63 mmHg (01/17 1400) FiO2 (%):  [40 %-100 %] 40 % (01/17 1400) Weight:  [278 lb (126.1 kg)] 278 lb (126.1 kg) (01/17 0530) Last BM Date:  (unknown)  Intake/Output from previous day: 01/16 0701 - 01/17 0700 In: 5928.5 [I.V.:2028.5; NG/GT:120; IV Piggyback:900; TPN:2880] Out: 4305 [Urine:3725; Emesis/NG output:520; Drains:60] Intake/Output this shift: Total I/O In: 1696.6 [I.V.:456.6; NG/GT:30; IV Piggyback:300; TPN:910] Out: 1135 [Urine:1105; Drains:30]  Intubated.  Soft, Rt JP - serosang; L JP - purulent. Not really distended. NG - bilious  Lab Results:   Recent Labs  09/05/13 0500 09/06/13 0408  WBC 10.8* 12.2*  HGB 11.2* 11.2*  HCT 33.9* 33.7*  PLT 204 242   BMET  Recent Labs  09/05/13 0500 09/06/13 0408  NA 139 137  K 3.6* 4.0  CL 102 98  CO2 24 25  GLUCOSE 113* 120*  BUN 31* 40*  CREATININE 1.57* 2.01*  CALCIUM 7.4* 7.7*   PT/INR  Recent Labs  09/04/13 0358  LABPROT 13.4  INR 1.04   ABG  Recent Labs  09/05/13 0433 09/06/13 0346  PHART 7.332* 7.328*  HCO3 28.1* 26.8*    Studies/Results: Ct Abdomen Pelvis Wo Contrast  09/05/2013   CLINICAL DATA:  Patient status post surgery for gastric perforation. Septic shock  EXAM: CT ABDOMEN AND PELVIS WITHOUT CONTRAST  TECHNIQUE: Multidetector CT imaging of the abdomen and pelvis was performed following the standard protocol without intravenous contrast.  COMPARISON:  CT ABD/PELVIS W CM dated 08/28/2013; DG CHEST 1V PORT dated 09/05/2013  FINDINGS: There are moderate bilateral pleural effusions increased from prior CT of 08/28/2013. There is new diffuse  ground-glass opacity within the inferior lingula consistent pulmonary edema. New consolidation in the right lower lobe (image number 9).  Ventral abdominal wound is partially open. There are 2 percutaneous drains during through the anterior abdominal wall and coiled in the ventral upper abdomen. There is amount of edema within the ventral peritoneal fat and omentum without organized fluid collection. Contrast administered into the stomach could NG tube demonstrates no extraluminal leak. Contrast flows into the mid small bowel. The distal small bowel bowel and colon are not opacified but appear normal. .  There is small fluid collection in the gastrosplenic ligament measuring 5.6 x 3.9 cm (image 35, series 2) which has fluid attenuation. There is a fluid within the of mesentery of the loops of is small bowel and along the right pericolic gutter small of fluid the pelvis which is not organized.  These liver, gallbladder, pancreas, spleen, adrenal glands, kidneys are normal.  There is a Foley catheter within the bladder which is collapsed. No pelvic lymphadenopathy or retroperitoneal adenopathy. Abdominal aorta is normal. No aggressive osseous lesion. Anasarca of the soft tissues.  IMPRESSION: 1. New bilateral pleural effusions, pulmonary edema and a consolidation at the right lung base. Cannot exclude pneumonia at the right lung base. 2. No evidence of leak from the stomach or small bowel following injection of contrast through the NG tube 3. Small fluid collection in the gastrosplenic ligament. 4. Moderate volume of intraperitoneal free fluid in the leaves of the mesentery in the pelvis related to  recent surgery and sepsis. 5. Extensive edema within theperitoneal fat and omentum of the ventral peritoneal space down to the transverse colon. 6. Anasarca of soft tissues.   Electronically Signed   By: Genevive BiStewart  Edmunds M.D.   On: 09/05/2013 19:36   Dg Chest Port 1 View  09/06/2013   CLINICAL DATA:  Septic shock. ARDS.  Ventilator dependent respiratory failure.  EXAM: PORTABLE CHEST - 1 VIEW  COMPARISON:  09/05/2013  FINDINGS: Support apparatus in appropriate position. Heart size is stable. Diffuse bilateral airspace disease shows mild improvement. There is persistent small to moderate right pleural effusion and right basilar atelectasis versus consolidation, which shows no significant change.  IMPRESSION: Mild improvement in diffuse bilateral airspace disease.  Stable right pleural effusion and right basilar atelectasis versus consolidation.   Electronically Signed   By: Myles RosenthalJohn  Stahl M.D.   On: 09/06/2013 07:34   Dg Chest Port 1 View  09/05/2013   CLINICAL DATA:  48 year old male with sepsis, ARDS. Intubated. Initial encounter.  EXAM: PORTABLE CHEST - 1 VIEW  COMPARISON:  09/04/2013 and earlier.  FINDINGS: Portable AP semi upright view at 0550 hrs. Stable endotracheal tube tip at the level the clavicles. Enteric tube courses to the abdomen, tip not included. Stable right IJ central line.  Mildly larger lung volume with decreased confluence of opacity in the left upper lung. Otherwise stable widespread pulmonary opacity with probable bilateral pleural effusions. Stable cardiac size and mediastinal contours.  IMPRESSION: 1.  Stable lines and tubes. 2. Mildly improved ventilation, more so in the left lung. Underlying widespread opacity compatible with ARDS, and probable pleural effusions.   Electronically Signed   By: Augusto GambleLee  Hall M.D.   On: 09/05/2013 07:34    Anti-infectives: Anti-infectives   Start     Dose/Rate Route Frequency Ordered Stop   09/06/13 1600  imipenem-cilastatin (PRIMAXIN) 500 mg in sodium chloride 0.9 % 100 mL IVPB     500 mg 200 mL/hr over 30 Minutes Intravenous 3 times per day 09/06/13 1310     09/03/13 1100  imipenem-cilastatin (PRIMAXIN) 500 mg in sodium chloride 0.9 % 100 mL IVPB  Status:  Discontinued     500 mg 200 mL/hr over 30 Minutes Intravenous Every 6 hours 09/03/13 1007 09/06/13 1310    09/02/13 1300  vancomycin (VANCOCIN) IVPB 1000 mg/200 mL premix     1,000 mg 200 mL/hr over 60 Minutes Intravenous Every 12 hours 09/01/13 1527     08/30/13 2000  vancomycin (VANCOCIN) IVPB 750 mg/150 ml premix  Status:  Discontinued     750 mg 150 mL/hr over 60 Minutes Intravenous Every 8 hours 08/30/13 1356 09/01/13 1527   08/29/13 2200  micafungin (MYCAMINE) 100 mg in sodium chloride 0.9 % 100 mL IVPB     100 mg 100 mL/hr over 1 Hours Intravenous Every 24 hours 08/29/13 2036     08/29/13 0400  vancomycin (VANCOCIN) IVPB 1000 mg/200 mL premix  Status:  Discontinued     1,000 mg 200 mL/hr over 60 Minutes Intravenous Every 8 hours 08/28/13 2358 08/30/13 1356   08/29/13 0400  piperacillin-tazobactam (ZOSYN) IVPB 3.375 g  Status:  Discontinued     3.375 g 12.5 mL/hr over 240 Minutes Intravenous 3 times per day 08/28/13 2358 09/03/13 1002   08/28/13 1845  [MAR Hold]  vancomycin (VANCOCIN) IVPB 1000 mg/200 mL premix     (On MAR Hold since 08/28/13 1957)   1,000 mg 200 mL/hr over 60 Minutes Intravenous  Once 08/28/13 1831 08/28/13 1955  08/28/13 1845  [MAR Hold]  piperacillin-tazobactam (ZOSYN) IVPB 3.375 g     (On MAR Hold since 08/28/13 1957)   3.375 g 100 mL/hr over 30 Minutes Intravenous  Once 08/28/13 1831 08/28/13 2056      Assessment/Plan: Perforated stomach s/p Procedure(s): EXPLORATORY LAPAROTOMY,debridment of nacrotic stomach,and primary repair of perforated stomach. (N/A) resp failure Sepsis AKI PCMN  Ct reviewed. No obvious leak from large gastric repair. Pt has a LARGE amount of omentum that we placed high up in his abdomen - not surprised it appears edematous but doesn't appear dead or large abscess. Don't think gastrosplenic collection is worth going after right now.   rec cont to wean vent per CCM Cont TPN Once NG tube output goes down and if remains ventilated - will start trophic feeds  Mary Sella. Andrey Campanile, MD, FACS General, Bariatric, & Minimally Invasive  Surgery [facility], [place]    LOS: 9 days    James Mccormick 09/06/2013

## 2013-09-06 NOTE — Progress Notes (Signed)
Name: James Mccormick MRN: [id number] DOB: 05/06/1966    ADMISSION DATE:  08/28/2013 CONSULTATION DATE:  08/28/13 LOS 9 days   REFERRING MD :  Andrey Campanile PRIMARY SERVICE: CCS  CHIEF COMPLAINT:  Abdominal Pain  BRIEF PATIENT DESCRIPTION:  48 yo male smoker presented with abdominal pain from perforated gastric ulcer in setting of NSAID.  PCCM consulted to assist with septic shock and respiratory failure management.  SIGNIFICANT EVENTS: 1/8 Exploratory laparotomy, debridement of necrotic stomach with scissors, primary repair of perforated stomach in 2 layers.  Septic shock, VDRF, a fib post-op. 1/9 Cardiology consulted . ECHO EF 45% 1/11: still pressor dependent, NSR for > 24 hrs. Amiodarone stopped.  1/12: remain son levophed,. Left radial a line dc'ed 09/02/13: off levophed. Encephalopathic - randomly agitated. Mmit 08/28/2013 . TPN continuesoving around, biting tube, looks randomly, does not track. Failed SBT. Febrile. + 24 Liters. Back on neo 09/03/13: ARDS Protocol with 48h nimbex started. Left radial artery occluded - seen by [facility]VS. Expectant mgmt. Still on neo 1/15: 50% fio2 on ARDS protocol with nimbex. LEft radial artery occlusion events noted. CCS considering CT abd v UGI scope. Fever curve improving after abx change 24h ago 1/17 will attempt to get off paralytics and drop PEEP to 5.   STUDIES:  1/8 CT abd/pelvis >> pneumoperitoneum, Lt kidney stone, diverticulosis 1/9 Echo >> mild LVH, EF 40 to 45%, grade 1 diastolic dysfx  LINES / TUBES: 1/8 ETT >>  1/8 Rt IJ CVL >> 1/8 Lt radial aline >>>1/12 1/14 - Rt radial aline>>  CULTURES: Blood 1/9 >> .Marland Kitchen... Tracheal aspirate 1/16>>  ANTIBIOTICS: Vancomycin 1/8 >>> Zosyn 1/8 >>>>1/14 Micafungin 1/9 >>>off date unknown Imipenem 1/14 >>>  SUBJECTIVE:  Tolerating FiO2 of 40 and PEEP of 8.  No events overnight.  VITAL SIGNS: Temp:  [98.9 F (37.2 C)-100.7 F (38.2 C)] 99.1 F (37.3 C) (01/17 0741) Pulse Rate:  [34-134] 100  (01/17 0830) Resp:  [0-34] 0 (01/17 0830) BP: (77-161)/(42-78) 128/62 mmHg (01/17 0830) SpO2:  [92 %-100 %] 98 % (01/17 0830) Arterial Line BP: (79-190)/(45-104) 140/61 mmHg (01/17 0830) FiO2 (%):  [40 %-100 %] 40 % (01/17 0800) Weight:  [126.1 kg (278 lb)] 126.1 kg (278 lb) (01/17 0530) HEMODYNAMICS: CVP:  [15 mmHg-21 mmHg] 21 mmHg VENTILATOR SETTINGS: Vent Mode:  [-] PRVC FiO2 (%):  [40 %-100 %] 40 % Set Rate:  [34 bmp] 34 bmp Vt Set:  [450 mL] 450 mL PEEP:  [8 cmH20] 8 cmH20 Plateau Pressure:  [32 cmH20-36 cmH20] 36 cmH20 INTAKE / OUTPUT: Intake/Output     01/16 0701 - 01/17 0700 01/17 0701 - 01/18 0700   I.V. (mL/kg) 2028.5 (16.1) 63.8 (0.5)   NG/GT 120 30   IV Piggyback 900    TPN 2880 130   Total Intake(mL/kg) 5928.5 (47) 223.8 (1.8)   Urine (mL/kg/hr) 3725 (1.2) 150 (0.7)   Emesis/NG output 520 (0.2)    Drains 60 (0) 30 (0.1)   Total Output 4305 180   Net +1623.5 +43.8          PHYSICAL EXAMINATION: General: no distress, heavily sedated and paralyzed. Critically ill Neuro:  RASS -4, BIS noted on fent and precedex HEENT: ETT in place Cardiovascular: s1 s2 SR no r/g. On neo due to precedex Lungs: coarse bilat Abdomen: wound dressing clean, drains w/ ser/sang drainage, abd distended, hypoactive   Musculoskeletal: 2+ edema, gen. Left Patteson forearm discoloaration? Due to IV extravasation Skin: lower extremities cool  LABS: PULMONARY  Recent  Labs Lab 09/04/13 0235 09/04/13 0423 09/04/13 0538 09/05/13 0433 09/06/13 0346  PHART 7.315* 7.316* 7.345* 7.332* 7.328*  PCO2ART 52.2* 53.6* 51.6* 53.4* 51.7*  PO2ART 72.0* 79.0* 62.0* 64.0* 79.0*  HCO3 26.5* 27.2* 28.0* 28.1* 26.8*  TCO2 28 29 30 30 28   O2SAT 92.0 94.0 89.0 89.0 93.0   CBC  Recent Labs Lab 09/04/13 0358 09/05/13 0500 09/06/13 0408  HGB 11.7* 11.2* 11.2*  HCT 35.6* 33.9* 33.7*  WBC 12.9* 10.8* 12.2*  PLT 152 204 242   COAGULATION  Recent Labs Lab 09/04/13 0358  INR 1.04   CARDIAC    Recent Labs Lab 09/03/13 0420  TROPONINI <0.30    Recent Labs Lab 09/03/13 0420 09/05/13 0500 09/06/13 0408  PROBNP 358.1* 909.6* 986.2*   CHEMISTRY  Recent Labs Lab 09/01/13 0525 09/02/13 0425 09/03/13 0420 09/04/13 0358 09/05/13 0500 09/06/13 0408  NA 136* 143 139 140 139 137  K 3.5* 3.7 3.3* 3.6* 3.6* 4.0  CL 101 108 102 101 102 98  CO2 24 26 24 25 24 25   GLUCOSE 139* 97 123* 125* 113* 120*  BUN 32* 26* 27* 29* 31* 40*  CREATININE 1.29 1.34 1.41* 1.38* 1.57* 2.01*  CALCIUM 7.0* 7.1* 7.6* 7.6* 7.4* 7.7*  MG 2.0  --  1.8 1.9 2.0 2.1  PHOS 1.9* 2.2* 2.5 5.1* 5.3* 5.5*   Estimated Creatinine Clearance: 60.1 ml/min (by C-G formula based on Cr of 2.01).  LIVER  Recent Labs Lab 08/30/13 1147 08/31/13 0330 09/01/13 0525 09/04/13 0358  AST 86* 97* 54* 28  ALT 45 61* 48 21  ALKPHOS 38* 43 50 62  BILITOT 0.3 0.5 0.7 1.0  PROT 4.1* 4.2* 4.7* 4.9*  ALBUMIN 1.9* 1.8* 1.7* 1.4*  INR  --   --   --  1.04   INFECTIOUS  Recent Labs Lab 09/03/13 0421 09/03/13 0959 09/04/13 0358 09/05/13 0500  LATICACIDVEN 1.6  --  1.4  --   PROCALCITON  --  24.32 15.05 11.21   ENDOCRINE CBG (last 3)   Recent Labs  09/05/13 1726 09/05/13 2331 09/06/13 0341  GLUCAP 129* 109* 111*   IMAGING x48h  Ct Abdomen Pelvis Wo Contrast  09/05/2013   CLINICAL DATA:  Patient status post surgery for gastric perforation. Septic shock  EXAM: CT ABDOMEN AND PELVIS WITHOUT CONTRAST  TECHNIQUE: Multidetector CT imaging of the abdomen and pelvis was performed following the standard protocol without intravenous contrast.  COMPARISON:  CT ABD/PELVIS W CM dated 08/28/2013; DG CHEST 1V PORT dated 09/05/2013  FINDINGS: There are moderate bilateral pleural effusions increased from prior CT of 08/28/2013. There is new diffuse ground-glass opacity within the inferior lingula consistent pulmonary edema. New consolidation in the right lower lobe (image number 9).  Ventral abdominal wound is partially open.  There are 2 percutaneous drains during through the anterior abdominal wall and coiled in the ventral upper abdomen. There is amount of edema within the ventral peritoneal fat and omentum without organized fluid collection. Contrast administered into the stomach could NG tube demonstrates no extraluminal leak. Contrast flows into the mid small bowel. The distal small bowel bowel and colon are not opacified but appear normal. .  There is small fluid collection in the gastrosplenic ligament measuring 5.6 x 3.9 cm (image 35, series 2) which has fluid attenuation. There is a fluid within the of mesentery of the loops of is small bowel and along the right pericolic gutter small of fluid the pelvis which is not organized.  These liver, gallbladder,  pancreas, spleen, adrenal glands, kidneys are normal.  There is a Foley catheter within the bladder which is collapsed. No pelvic lymphadenopathy or retroperitoneal adenopathy. Abdominal aorta is normal. No aggressive osseous lesion. Anasarca of the soft tissues.  IMPRESSION: 1. New bilateral pleural effusions, pulmonary edema and a consolidation at the right lung base. Cannot exclude pneumonia at the right lung base. 2. No evidence of leak from the stomach or small bowel following injection of contrast through the NG tube 3. Small fluid collection in the gastrosplenic ligament. 4. Moderate volume of intraperitoneal free fluid in the leaves of the mesentery in the pelvis related to recent surgery and sepsis. 5. Extensive edema within theperitoneal fat and omentum of the ventral peritoneal space down to the transverse colon. 6. Anasarca of soft tissues.   Electronically Signed   By: Genevive Bi M.D.   On: 09/05/2013 19:36   Dg Chest Port 1 View  09/06/2013   CLINICAL DATA:  Septic shock. ARDS. Ventilator dependent respiratory failure.  EXAM: PORTABLE CHEST - 1 VIEW  COMPARISON:  09/05/2013  FINDINGS: Support apparatus in appropriate position. Heart size is stable. Diffuse  bilateral airspace disease shows mild improvement. There is persistent small to moderate right pleural effusion and right basilar atelectasis versus consolidation, which shows no significant change.  IMPRESSION: Mild improvement in diffuse bilateral airspace disease.  Stable right pleural effusion and right basilar atelectasis versus consolidation.   Electronically Signed   By: Myles Rosenthal M.D.   On: 09/06/2013 07:34   Dg Chest Port 1 View  09/05/2013   CLINICAL DATA:  48 year old male with sepsis, ARDS. Intubated. Initial encounter.  EXAM: PORTABLE CHEST - 1 VIEW  COMPARISON:  09/04/2013 and earlier.  FINDINGS: Portable AP semi upright view at 0550 hrs. Stable endotracheal tube tip at the level the clavicles. Enteric tube courses to the abdomen, tip not included. Stable right IJ central line.  Mildly larger lung volume with decreased confluence of opacity in the left upper lung. Otherwise stable widespread pulmonary opacity with probable bilateral pleural effusions. Stable cardiac size and mediastinal contours.  IMPRESSION: 1.  Stable lines and tubes. 2. Mildly improved ventilation, more so in the left lung. Underlying widespread opacity compatible with ARDS, and probable pleural effusions.   Electronically Signed   By: Augusto Gamble M.D.   On: 09/05/2013 07:34   ASSESSMENT / PLAN:  PULMONARY A:  Acute respiratory failure in setting of peritonitis Admissions asp? RLL ATX Bibasilar atx, probable sympathetic effusions 1/11 Hx of smoking >> air trapping on vent 1/10.  PLAN  - Decrease PEEP to 5 and maintain FiO2. - Attempt to get off nimbex today, if desats or more dysnchrony then will restart. - Contniue ARDS PROTOCOL.  CARDIAC A:  Septic shock 2nd to peritonitis. A fib with RVR, resolved, SR for > 24hrs // s/p IV amio 08/29/13 through 08/30/13 Acute systolic heart failure likely 2nd to sepsis. NSTEMI from demand ischemia. Hx of HTN with chronic diastolic dysfx. ECHO 1/19 with systolic  CHF Severely fluid overloaded.  P:  - Lasix drip today. - BMET in AM. - Will likely need CVVH for volume control if not responding to lasix.  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 Seen by VVS P - Per VVS.  RENAL A:   Acute kidney injury 2nd to hypovolemia, and ATN in setting of shock. He has had some marginal bump in scr but hoping that this reflects plateau  Metabolic acidosis >> improved.  P:   - Replete  electrolytes as needed. - Lasix drip as ordered. - KVO IVF. - BMET in AM.  GASTROINTESTINAL A:   Perforated gastric ulcer in setting of NSAID use as outpt. Protein calorie malnutrition.  P:   - Post -op care per CCS. - Protonix BID. - Continue TNA per CCS. - CT abd when stable.  HEMATOLOGIC A:   Anemia of critical illness Low platelet, HIT negative 09/02/13. Likely due to sepsis  P:  - F/u CBC in am , PRBC for hgb < 7gm% only. - SQ lovenox for DVT prevention.  INFECTIOUS A:  Septic shock 2nd to peritonitis.  P:   - Continue vancomycin. - Continue Imipenem - Drain management per surg - CT abd when stable per CCS  ENDOCRINE A:   Hyperglycemia. P:   - SSI - Repeat cortisol level today and will start stress dose steroids regardless given duration of illness and now is back on pressors.   NEUROLOGIC A:   Acute encephalopathy 2nd to septic shock.  P:   - Deep sedation with fent/versed due top ARDS. - Attempt to get off nimbex today. - Goal RASS -4 given severity and duration of critical illness.  GLOBAL Hopefully get off nimbex today, drop peep to 5, start lasix drip and stress dose steroids, if dysnchrony then will restart nimbex until abdominal condition is fully addressed by CCS.  Will likely need CVVH before this bout is over.  The patient is critically ill with multiple organ systems failure and requires high complexity decision making for assessment and support, frequent evaluation and titration of therapies, application of  advanced monitoring technologies and extensive interpretation of multiple databases.   Critical Care Time devoted to patient care services described in this note is 35 minutes.  Alyson Reedy, M.D. [facility] Pulmonary/Critical Care Medicine. Pager: [phone]. After hours pager: [phone].

## 2013-09-07 ENCOUNTER — Inpatient Hospital Stay (HOSPITAL_COMMUNITY): Payer: Medicaid Other

## 2013-09-07 LAB — POCT I-STAT 3, ART BLOOD GAS (G3+)
Acid-Base Excess: 3 mmol/L — ABNORMAL HIGH (ref 0.0–2.0)
BICARBONATE: 29.4 meq/L — AB (ref 20.0–24.0)
O2 Saturation: 92 %
PCO2 ART: 51.7 mmHg — AB (ref 35.0–45.0)
PH ART: 7.362 (ref 7.350–7.450)
PO2 ART: 66 mmHg — AB (ref 80.0–100.0)
Patient temperature: 98.3
TCO2: 31 mmol/L (ref 0–100)

## 2013-09-07 LAB — PHOSPHORUS: Phosphorus: 5.6 mg/dL — ABNORMAL HIGH (ref 2.3–4.6)

## 2013-09-07 LAB — GLUCOSE, CAPILLARY
GLUCOSE-CAPILLARY: 169 mg/dL — AB (ref 70–99)
GLUCOSE-CAPILLARY: 162 mg/dL — AB (ref 70–99)
Glucose-Capillary: 155 mg/dL — ABNORMAL HIGH (ref 70–99)

## 2013-09-07 LAB — CBC WITH DIFFERENTIAL/PLATELET
BASOS PCT: 1 % (ref 0–1)
Basophils Absolute: 0.1 10*3/uL (ref 0.0–0.1)
EOS PCT: 0 % (ref 0–5)
Eosinophils Absolute: 0 10*3/uL (ref 0.0–0.7)
HEMATOCRIT: 32.1 % — AB (ref 39.0–52.0)
HEMOGLOBIN: 10.9 g/dL — AB (ref 13.0–17.0)
LYMPHS PCT: 10 % — AB (ref 12–46)
Lymphs Abs: 1.3 10*3/uL (ref 0.7–4.0)
MCH: 30.5 pg (ref 26.0–34.0)
MCHC: 34 g/dL (ref 30.0–36.0)
MCV: 89.9 fL (ref 78.0–100.0)
Monocytes Absolute: 1 10*3/uL (ref 0.1–1.0)
Monocytes Relative: 8 % (ref 3–12)
NEUTROS ABS: 10.6 10*3/uL — AB (ref 1.7–7.7)
Neutrophils Relative %: 81 % — ABNORMAL HIGH (ref 43–77)
Platelets: 266 10*3/uL (ref 150–400)
RBC: 3.57 MIL/uL — ABNORMAL LOW (ref 4.22–5.81)
RDW: 15 % (ref 11.5–15.5)
WBC Morphology: INCREASED
WBC: 13 10*3/uL — ABNORMAL HIGH (ref 4.0–10.5)

## 2013-09-07 LAB — BASIC METABOLIC PANEL
BUN: 56 mg/dL — ABNORMAL HIGH (ref 6–23)
CHLORIDE: 95 meq/L — AB (ref 96–112)
CO2: 25 mEq/L (ref 19–32)
Calcium: 7.8 mg/dL — ABNORMAL LOW (ref 8.4–10.5)
Creatinine, Ser: 2.17 mg/dL — ABNORMAL HIGH (ref 0.50–1.35)
GFR calc Af Amer: 39 mL/min — ABNORMAL LOW (ref >90)
GFR, EST NON AFRICAN AMERICAN: 34 mL/min — AB (ref >90)
Glucose, Bld: 174 mg/dL — ABNORMAL HIGH (ref 70–99)
POTASSIUM: 3.8 meq/L (ref 3.7–5.3)
SODIUM: 137 meq/L (ref 137–147)

## 2013-09-07 LAB — CULTURE, RESPIRATORY
CULTURE: NO GROWTH
GRAM STAIN: NONE SEEN
SPECIAL REQUESTS: NORMAL

## 2013-09-07 LAB — VANCOMYCIN, TROUGH: Vancomycin Tr: 33.3 ug/mL (ref 10.0–20.0)

## 2013-09-07 LAB — MAGNESIUM: MAGNESIUM: 2.2 mg/dL (ref 1.5–2.5)

## 2013-09-07 MED ORDER — PIVOT 1.5 CAL PO LIQD
1000.0000 mL | ORAL | Status: DC
  Administered 2013-09-07: 1000 mL
  Filled 2013-09-07: qty 1000

## 2013-09-07 MED ORDER — FUROSEMIDE 10 MG/ML IJ SOLN
8.0000 mg/h | INTRAVENOUS | Status: AC
  Administered 2013-09-07: 8 mg/h via INTRAVENOUS
  Filled 2013-09-07: qty 25

## 2013-09-07 MED ORDER — METOLAZONE 5 MG PO TABS
5.0000 mg | ORAL_TABLET | Freq: Every day | ORAL | Status: AC
  Administered 2013-09-07: 5 mg via ORAL
  Filled 2013-09-07: qty 1

## 2013-09-07 MED ORDER — INSULIN REGULAR HUMAN 100 UNIT/ML IJ SOLN
INTRAMUSCULAR | Status: AC
Start: 2013-09-07 — End: 2013-09-08
  Administered 2013-09-07: 17:00:00 via INTRAVENOUS
  Filled 2013-09-07: qty 3000

## 2013-09-07 MED ORDER — FAT EMULSION 20 % IV EMUL
240.0000 mL | INTRAVENOUS | Status: AC
  Administered 2013-09-07: 240 mL via INTRAVENOUS
  Filled 2013-09-07: qty 250

## 2013-09-07 MED ORDER — POTASSIUM CHLORIDE 10 MEQ/50ML IV SOLN
10.0000 meq | Freq: Three times a day (TID) | INTRAVENOUS | Status: AC
  Administered 2013-09-07 (×3): 10 meq via INTRAVENOUS
  Filled 2013-09-07 (×3): qty 50

## 2013-09-07 NOTE — Progress Notes (Signed)
PARENTERAL NUTRITION CONSULT NOTE - FOLLOW UP  Pharmacy Consult for TPN Indication: Prolonged ileus  No Known Allergies  Patient Measurements: Height: 5\' 11"  (180.3 cm) Weight: 276 lb 10.8 oz (125.5 kg) IBW/kg (Calculated) : 75.3 Vital Signs: Temp: 98.9 F (37.2 C) (01/18 0400) Temp src: Oral (01/18 0400) BP: 107/59 mmHg (01/18 0700) Pulse Rate: 78 (01/18 0700) Intake/Output from previous day: 01/17 0701 - 01/18 0700 In: 8269.4 [I.V.:4099.4; NG/GT:150; IV Piggyback:900; TPN:3120] Out: 7705 [Urine:7005; Emesis/NG output:650; Drains:50] Intake/Output from this shift:    Labs:  Recent Labs  09/05/13 0500 09/06/13 0408 09/07/13 0350  WBC 10.8* 12.2* 13.0*  HGB 11.2* 11.2* 10.9*  HCT 33.9* 33.7* 32.1*  PLT 204 242 266     Recent Labs  09/05/13 0500 09/06/13 0408 09/06/13 1600 09/07/13 0350  NA 139 137  --  137  K 3.6* 4.0 4.3 3.8  CL 102 98  --  95*  CO2 24 25  --  25  GLUCOSE 113* 120*  --  174*  BUN 31* 40*  --  56*  CREATININE 1.57* 2.01*  --  2.17*  CALCIUM 7.4* 7.7*  --  7.8*  MG 2.0 2.1  --  2.2  PHOS 5.3* 5.5*  --  5.6*   Estimated Creatinine Clearance: 55.6 ml/min (by C-G formula based on Cr of 2.17).    Recent Labs  09/06/13 1151 09/06/13 1732 09/06/13 1936  GLUCAP 109* 139* 160*    Insulin Requirements in the past 24 hours:  6 units Novolog ICU-SSI and 20 units in TPN,   Current Nutrition:  NPO   Assessment:  48 y.o. male presented to ED 1/8 with abd pain. CT consistent with perforated stomach ulcer due to NSAID use; pneumoperitoneum; peritonitis. Taken emergently to OR where he underwent exp lap, debridement of necrotic stomach, repair of perforated stomach. Contrast study showed no leak from stomach or small bowel.   GI: NPO.  Hypoactive BS, NG with bilious output  Prealb 4.7 Endo: No h/o DM. CBGs 109-160 Lytes: Na 137, K 3.8, Phos 5.6 , mag 2.2;  Corr Ca 9.88 CoCa X phos = 52.36   Will need to cont with no lytes in TPN today Renal:  Cr  Up to 2.17  UOP 2.3 ml/Kg/hr on lasix drip Hepatobil: LFTs wnl   TG 250 TPN Access: CVC 08/29/13  TPN day#: 9  Nutritional Goals:  Goal 2500 kcal and 150-160gm protein per RD recommendations 09/02/13.   Plan:  1. Change to Clinimix  5/15 without electrolytes  Continue lipids at 10 ml/hr.  2. Cont rate  120 ml/hr to provide 144 gm protein and 2525 Kcals. This provides 96% minimum protein goals and 100 % Kcal goals. 3. Insulin 20 units/ 3L 4.  Will supplement K as no lytes in TPN and on lasix drip 5. TPN labs in am  HaywardLora Oryn Casanova, [place]arm.D. [phone] 09/07/2013 7:10 AM

## 2013-09-07 NOTE — Progress Notes (Signed)
Name: James Mccormick MRN: [id number] DOB: 29-Jun-1966    ADMISSION DATE:  08/28/2013 CONSULTATION DATE:  08/28/13 LOS 10 days   REFERRING MD :  Andrey Campanile PRIMARY SERVICE: [facility]  CHIEF COMPLAINT:  Abdominal Pain  BRIEF PATIENT DESCRIPTION:  48 yo male smoker presented with abdominal pain from perforated gastric ulcer in setting of NSAID.  PCCM consulted to assist with septic shock and respiratory failure management.  SIGNIFICANT EVENTS: 1/8 Exploratory laparotomy, debridement of necrotic stomach with scissors, primary repair of perforated stomach in 2 layers.  Septic shock, VDRF, a fib post-op. 1/9 Cardiology consulted . ECHO EF 45% 1/11: still pressor dependent, NSR for > 24 hrs. Amiodarone stopped.  1/12: remain son levophed,. Left radial a line dc'ed 09/02/13: off levophed. Encephalopathic - randomly agitated. Mmit 08/28/2013 . TPN continuesoving around, biting tube, looks randomly, does not track. Failed SBT. Febrile. + 24 Liters. Back on neo 09/03/13: ARDS Protocol with 48h nimbex started. Left radial artery occluded - seen by VVS. Expectant mgmt. Still on neo 1/15: 50% fio2 on ARDS protocol with nimbex. LEft radial artery occlusion events noted. CCS considering CT abd v UGI scope. Fever curve improving after abx change 24h ago 1/17 will attempt to get off paralytics and drop PEEP to 5.   STUDIES:  1/8 CT abd/pelvis >> pneumoperitoneum, Lt kidney stone, diverticulosis 1/9 Echo >> mild LVH, EF 40 to 45%, grade 1 diastolic dysfx  LINES / TUBES: 1/8 ETT >>  1/8 Rt IJ CVL >> 1/8 Lt radial aline >>>1/12 1/14 - Rt radial aline>>  CULTURES: Blood 1/9 >> .Marland Kitchen... Tracheal aspirate 1/16>>  ANTIBIOTICS: Vancomycin 1/8 >>> Zosyn 1/8 >>>>1/14 Micafungin 1/9 >>>off date unknown Imipenem 1/14 >>>  SUBJECTIVE:  Tolerating FiO2 of 40 and PEEP of 8.  No events overnight.  VITAL SIGNS: Temp:  [97.6 F (36.4 C)-101.9 F (38.8 C)] 97.9 F (36.6 C) (01/18 0723) Pulse Rate:  [34-104] 84  (01/18 1030) Resp:  [0-34] 34 (01/18 1030) BP: (102-171)/(59-96) 102/62 mmHg (01/18 1000) SpO2:  [89 %-99 %] 93 % (01/18 1030) Arterial Line BP: (99-192)/(55-97) 131/71 mmHg (01/18 1030) FiO2 (%):  [40 %-70 %] 50 % (01/18 1030) Weight:  [125.5 kg (276 lb 10.8 oz)] 125.5 kg (276 lb 10.8 oz) (01/18 0500) HEMODYNAMICS: CVP:  [17 mmHg-19 mmHg] 17 mmHg VENTILATOR SETTINGS: Vent Mode:  [-] PRVC FiO2 (%):  [40 %-70 %] 50 % Set Rate:  [34 bmp] 34 bmp Vt Set:  [450 mL] 450 mL PEEP:  [5 cmH20] 5 cmH20 Plateau Pressure:  [20 cmH20-32 cmH20] 27 cmH20 INTAKE / OUTPUT: Intake/Output     01/17 0701 - 01/18 0700 01/18 0701 - 01/19 0700   I.V. (mL/kg) 4099.4 (32.7) 906 (7.2)   NG/GT 150 30   IV Piggyback 900 50   TPN 3120 390   Total Intake(mL/kg) 8269.4 (65.9) 1376 (11)   Urine (mL/kg/hr) 7005 (2.3) 1050 (2)   Emesis/NG output 650 (0.2) 50 (0.1)   Drains 50 (0) 5 (0)   Total Output 7705 1105   Net +564.4 +271          PHYSICAL EXAMINATION: General: no distress, heavily sedated and paralyzed. Critically ill Neuro:  RASS -4, BIS noted on fent and precedex HEENT: ETT in place Cardiovascular: s1 s2 SR no r/g. On neo due to precedex Lungs: coarse bilat Abdomen: wound dressing clean, drains w/ ser/sang drainage, abd distended, hypoactive   Musculoskeletal: 2+ edema, gen. Left Espina forearm discoloaration? Due to IV extravasation Skin: lower extremities cool  LABS: PULMONARY  Recent Labs Lab 09/04/13 0423 09/04/13 0538 09/05/13 0433 09/06/13 0346 09/07/13 0350  PHART 7.316* 7.345* 7.332* 7.328* 7.362  PCO2ART 53.6* 51.6* 53.4* 51.7* 51.7*  PO2ART 79.0* 62.0* 64.0* 79.0* 66.0*  HCO3 27.2* 28.0* 28.1* 26.8* 29.4*  TCO2 29 30 30 28 31   O2SAT 94.0 89.0 89.0 93.0 92.0   CBC  Recent Labs Lab 09/05/13 0500 09/06/13 0408 09/07/13 0350  HGB 11.2* 11.2* 10.9*  HCT 33.9* 33.7* 32.1*  WBC 10.8* 12.2* 13.0*  PLT 204 242 266   COAGULATION  Recent Labs Lab 09/04/13 0358  INR 1.04    CARDIAC    Recent Labs Lab 09/03/13 0420  TROPONINI <0.30    Recent Labs Lab 09/03/13 0420 09/05/13 0500 09/06/13 0408  PROBNP 358.1* 909.6* 986.2*   CHEMISTRY  Recent Labs Lab 09/03/13 0420 09/04/13 0358 09/05/13 0500 09/06/13 0408 09/06/13 1600 09/07/13 0350  NA 139 140 139 137  --  137  K 3.3* 3.6* 3.6* 4.0 4.3 3.8  CL 102 101 102 98  --  95*  CO2 24 25 24 25   --  25  GLUCOSE 123* 125* 113* 120*  --  174*  BUN 27* 29* 31* 40*  --  56*  CREATININE 1.41* 1.38* 1.57* 2.01*  --  2.17*  CALCIUM 7.6* 7.6* 7.4* 7.7*  --  7.8*  MG 1.8 1.9 2.0 2.1  --  2.2  PHOS 2.5 5.1* 5.3* 5.5*  --  5.6*   Estimated Creatinine Clearance: 55.6 ml/min (by C-G formula based on Cr of 2.17).  LIVER  Recent Labs Lab 09/01/13 0525 09/04/13 0358  AST 54* 28  ALT 48 21  ALKPHOS 50 62  BILITOT 0.7 1.0  PROT 4.7* 4.9*  ALBUMIN 1.7* 1.4*  INR  --  1.04   INFECTIOUS  Recent Labs Lab 09/03/13 0421 09/03/13 0959 09/04/13 0358 09/05/13 0500  LATICACIDVEN 1.6  --  1.4  --   PROCALCITON  --  24.32 15.05 11.21   ENDOCRINE CBG (last 3)   Recent Labs  09/06/13 1151 09/06/13 1732 09/06/13 1936  GLUCAP 109* 139* 160*   IMAGING x48h  Ct Abdomen Pelvis Wo Contrast  09/05/2013   CLINICAL DATA:  Patient status post surgery for gastric perforation. Septic shock  EXAM: CT ABDOMEN AND PELVIS WITHOUT CONTRAST  TECHNIQUE: Multidetector CT imaging of the abdomen and pelvis was performed following the standard protocol without intravenous contrast.  COMPARISON:  CT ABD/PELVIS W CM dated 08/28/2013; DG CHEST 1V PORT dated 09/05/2013  FINDINGS: There are moderate bilateral pleural effusions increased from prior CT of 08/28/2013. There is new diffuse ground-glass opacity within the inferior lingula consistent pulmonary edema. New consolidation in the right lower lobe (image number 9).  Ventral abdominal wound is partially open. There are 2 percutaneous drains during through the anterior  abdominal wall and coiled in the ventral upper abdomen. There is amount of edema within the ventral peritoneal fat and omentum without organized fluid collection. Contrast administered into the stomach could NG tube demonstrates no extraluminal leak. Contrast flows into the mid small bowel. The distal small bowel bowel and colon are not opacified but appear normal. .  There is small fluid collection in the gastrosplenic ligament measuring 5.6 x 3.9 cm (image 35, series 2) which has fluid attenuation. There is a fluid within the of mesentery of the loops of is small bowel and along the right pericolic gutter small of fluid the pelvis which is not organized.  These liver, gallbladder, pancreas,  spleen, adrenal glands, kidneys are normal.  There is a Foley catheter within the bladder which is collapsed. No pelvic lymphadenopathy or retroperitoneal adenopathy. Abdominal aorta is normal. No aggressive osseous lesion. Anasarca of the soft tissues.  IMPRESSION: 1. New bilateral pleural effusions, pulmonary edema and a consolidation at the right lung base. Cannot exclude pneumonia at the right lung base. 2. No evidence of leak from the stomach or small bowel following injection of contrast through the NG tube 3. Small fluid collection in the gastrosplenic ligament. 4. Moderate volume of intraperitoneal free fluid in the leaves of the mesentery in the pelvis related to recent surgery and sepsis. 5. Extensive edema within theperitoneal fat and omentum of the ventral peritoneal space down to the transverse colon. 6. Anasarca of soft tissues.   Electronically Signed   By: Genevive BiStewart  Edmunds M.D.   On: 09/05/2013 19:36   Dg Chest Port 1 View  09/07/2013   CLINICAL DATA:  Evaluate endotracheal tube.  EXAM: PORTABLE CHEST - 1 VIEW  COMPARISON:  09/06/2013  FINDINGS: Endotracheal tube is 4.3 cm above the carina. Nasogastric tube extends into the abdomen. Persistent low lung volumes with right basilar densities. Right basilar  densities may represent atelectasis or consolidation. Heart size is likely within normal limits based on the technique and low lung volumes. Again noted are patchy interstitial densities in both lungs. Central venous catheter in the upper SVC region. No evidence for a pneumothorax.  IMPRESSION: Persistent low lung volumes with right basilar densities. Right basilar densities could represent a combination of consolidation and pleural fluid.  Interstitial lung markings may be related to low lung volumes versus mild edema.   Electronically Signed   By: Richarda OverlieAdam  Henn M.D.   On: 09/07/2013 08:16   Dg Chest Port 1 View  09/06/2013   CLINICAL DATA:  Septic shock. ARDS. Ventilator dependent respiratory failure.  EXAM: PORTABLE CHEST - 1 VIEW  COMPARISON:  09/05/2013  FINDINGS: Support apparatus in appropriate position. Heart size is stable. Diffuse bilateral airspace disease shows mild improvement. There is persistent small to moderate right pleural effusion and right basilar atelectasis versus consolidation, which shows no significant change.  IMPRESSION: Mild improvement in diffuse bilateral airspace disease.  Stable right pleural effusion and right basilar atelectasis versus consolidation.   Electronically Signed   By: Myles RosenthalJohn  Stahl M.D.   On: 09/06/2013 07:34   ASSESSMENT / PLAN:  PULMONARY A:  Acute respiratory failure in setting of peritonitis Admissions asp? RLL ATX Bibasilar atx, probable sympathetic effusions 1/11 Hx of smoking >> air trapping on vent 1/10.  PLAN  - Maintain PEEP to 5 and maintain FiO2. - Maintain of nimbex. - Contniue ARDS PROTOCOL. - Aggressive diureses again today and minimize input as able.  CARDIAC A:  Septic shock 2nd to peritonitis. A fib with RVR, resolved, SR for > 24hrs // s/p IV amio 08/29/13 through 08/30/13 Acute systolic heart failure likely 2nd to sepsis. NSTEMI from demand ischemia. Hx of HTN with chronic diastolic dysfx. ECHO 1/19 with systolic CHF Severely  fluid overloaded.  P:  - Lasix drip increased to 8 mg/hr. - Zaroxolyn 5 mg PO x1. - KCl 40 meq x1 dose. - BMET in AM. - Consider CVVH is renal function deteriorates or stops responding to lasix.  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 Seen by VVS P - Per VVS.  RENAL A:   Acute kidney injury 2nd to hypovolemia, and ATN in setting of shock. He has had some marginal bump in  scr but hoping that this reflects plateau  Metabolic acidosis >> improved.  P:   - Replete electrolytes as needed. - Lasix drip increased to 8 mg/hr. - Zaroxolyn 5 mg PO x1. - KVO IVF. - BMET in AM.  GASTROINTESTINAL A:   Perforated gastric ulcer in setting of NSAID use as outpt. Protein calorie malnutrition.  P:   - Post -op care per CCS. - Protonix BID. - Continue TNA per CCS. - CT abd when stable.  HEMATOLOGIC A:   Anemia of critical illness Low platelet, HIT negative 09/02/13. Likely due to sepsis  P:  - F/u CBC in am , PRBC for hgb < 7gm% only. - SQ lovenox for DVT prevention.  INFECTIOUS A:  Septic shock 2nd to peritonitis.  P:   - Continue vancomycin. - Continue Imipenem. - Drain management per surg. - CT abd when stable per CCS.  ENDOCRINE A:   Hyperglycemia. P:   - SSI. - Repeat cortisol level today and will start stress dose steroids regardless given duration of illness and now is back on pressors.   NEUROLOGIC A:   Acute encephalopathy 2nd to septic shock.  P:   - Deep sedation with fent/versed due top ARDS. - Attempt to get off nimbex today. - Goal RASS -4 given severity and duration of critical illness.  GLOBAL Off nimbex since yesterday and well tolerated.  Lasix drip tolerated, doubt the I/O from yesterday are accurate so will continue lasix drip as ordered.  Replace K and will hold weaning off vent for now.  The patient is critically ill with multiple organ systems failure and requires high complexity decision making for assessment and support,  frequent evaluation and titration of therapies, application of advanced monitoring technologies and extensive interpretation of multiple databases.   Critical Care Time devoted to patient care services described in this note is 35 minutes.  Alyson Reedy, M.D. [facility] Pulmonary/Critical Care Medicine. Pager: [phone]. After hours pager: [phone].

## 2013-09-07 NOTE — Progress Notes (Signed)
CRITICAL VALUE ALERT  Critical value received:  Vancomycin level33.3    Date of notif01/18/2015  Time of notification:  1345 Critical value read back:yes  Nurse who received alert:  Acey LavPhyllis Akane Tessier rn  MD notified (1st page):  [facility] 2nd floor pharmacy  Time of first page:  1347  MD notified (2nd page):  Time of second p

## 2013-09-07 NOTE — Progress Notes (Signed)
10 Days Post-Op  Subjective: On vent  Objective: Vital signs in last 24 hours: Temp:  [97.6 F (36.4 C)-101.9 F (38.8 C)] 97.9 F (36.6 C) (01/18 0723) Pulse Rate:  [34-107] 83 (01/18 0833) Resp:  [0-34] 34 (01/18 0833) BP: (107-171)/(57-96) 116/69 mmHg (01/18 0833) SpO2:  [89 %-99 %] 94 % (01/18 0833) Arterial Line BP: (104-192)/(55-97) 123/65 mmHg (01/18 0830) FiO2 (%):  [40 %-70 %] 50 % (01/18 0833) Weight:  [276 lb 10.8 oz (125.5 kg)] 276 lb 10.8 oz (125.5 kg) (01/18 0500) Last BM Date:  (unknown)  Intake/Output from previous day: 01/17 0701 - 01/18 0700 In: 8269.4 [I.V.:4099.4; NG/GT:150; IV Piggyback:900; TPN:3120] Out: 7705 [Urine:7005; Emesis/NG output:650; Drains:50] Intake/Output this shift: Total I/O In: 582 [I.V.:422; NG/GT:30; TPN:130] Out: 405 [Urine:350; Emesis/NG output:50; Drains:5]  Resp: wheezes bilaterally Cardio: regular rate and rhythm GI: soft, wound with mostly clean granulation tissue, +BS, drain SS Extremities: bullae L forearm  Lab Results:   Recent Labs  09/06/13 0408 09/07/13 0350  WBC 12.2* 13.0*  HGB 11.2* 10.9*  HCT 33.7* 32.1*  PLT 242 266   BMET  Recent Labs  09/06/13 0408 09/06/13 1600 09/07/13 0350  NA 137  --  137  K 4.0 4.3 3.8  CL 98  --  95*  CO2 25  --  25  GLUCOSE 120*  --  174*  BUN 40*  --  56*  CREATININE 2.01*  --  2.17*  CALCIUM 7.7*  --  7.8*   PT/INR No results found for this basename: LABPROT, INR,  in the last 72 hours ABG  Recent Labs  09/06/13 0346 09/07/13 0350  PHART 7.328* 7.362  HCO3 26.8* 29.4*    Studies/Results: Ct Abdomen Pelvis Wo Contrast  09/05/2013   CLINICAL DATA:  Patient status post surgery for gastric perforation. Septic shock  EXAM: CT ABDOMEN AND PELVIS WITHOUT CONTRAST  TECHNIQUE: Multidetector CT imaging of the abdomen and pelvis was performed following the standard protocol without intravenous contrast.  COMPARISON:  CT ABD/PELVIS W CM dated 08/28/2013; DG CHEST 1V PORT  dated 09/05/2013  FINDINGS: There are moderate bilateral pleural effusions increased from prior CT of 08/28/2013. There is new diffuse ground-glass opacity within the inferior lingula consistent pulmonary edema. New consolidation in the right lower lobe (image number 9).  Ventral abdominal wound is partially open. There are 2 percutaneous drains during through the anterior abdominal wall and coiled in the ventral upper abdomen. There is amount of edema within the ventral peritoneal fat and omentum without organized fluid collection. Contrast administered into the stomach could NG tube demonstrates no extraluminal leak. Contrast flows into the mid small bowel. The distal small bowel bowel and colon are not opacified but appear normal. .  There is small fluid collection in the gastrosplenic ligament measuring 5.6 x 3.9 cm (image 35, series 2) which has fluid attenuation. There is a fluid within the of mesentery of the loops of is small bowel and along the right pericolic gutter small of fluid the pelvis which is not organized.  These liver, gallbladder, pancreas, spleen, adrenal glands, kidneys are normal.  There is a Foley catheter within the bladder which is collapsed. No pelvic lymphadenopathy or retroperitoneal adenopathy. Abdominal aorta is normal. No aggressive osseous lesion. Anasarca of the soft tissues.  IMPRESSION: 1. New bilateral pleural effusions, pulmonary edema and a consolidation at the right lung base. Cannot exclude pneumonia at the right lung base. 2. No evidence of leak from the stomach or small bowel following  injection of contrast through the NG tube 3. Small fluid collection in the gastrosplenic ligament. 4. Moderate volume of intraperitoneal free fluid in the leaves of the mesentery in the pelvis related to recent surgery and sepsis. 5. Extensive edema within theperitoneal fat and omentum of the ventral peritoneal space down to the transverse colon. 6. Anasarca of soft tissues.   Electronically  Signed   By: Genevive Bi M.D.   On: 09/05/2013 19:36   Dg Chest Port 1 View  09/07/2013   CLINICAL DATA:  Evaluate endotracheal tube.  EXAM: PORTABLE CHEST - 1 VIEW  COMPARISON:  09/06/2013  FINDINGS: Endotracheal tube is 4.3 cm above the carina. Nasogastric tube extends into the abdomen. Persistent low lung volumes with right basilar densities. Right basilar densities may represent atelectasis or consolidation. Heart size is likely within normal limits based on the technique and low lung volumes. Again noted are patchy interstitial densities in both lungs. Central venous catheter in the upper SVC region. No evidence for a pneumothorax.  IMPRESSION: Persistent low lung volumes with right basilar densities. Right basilar densities could represent a combination of consolidation and pleural fluid.  Interstitial lung markings may be related to low lung volumes versus mild edema.   Electronically Signed   By: Richarda Overlie M.D.   On: 09/07/2013 08:16   Dg Chest Port 1 View  09/06/2013   CLINICAL DATA:  Septic shock. ARDS. Ventilator dependent respiratory failure.  EXAM: PORTABLE CHEST - 1 VIEW  COMPARISON:  09/05/2013  FINDINGS: Support apparatus in appropriate position. Heart size is stable. Diffuse bilateral airspace disease shows mild improvement. There is persistent small to moderate right pleural effusion and right basilar atelectasis versus consolidation, which shows no significant change.  IMPRESSION: Mild improvement in diffuse bilateral airspace disease.  Stable right pleural effusion and right basilar atelectasis versus consolidation.   Electronically Signed   By: Myles Rosenthal M.D.   On: 09/06/2013 07:34    Anti-infectives: Anti-infectives   Start     Dose/Rate Route Frequency Ordered Stop   09/06/13 1600  imipenem-cilastatin (PRIMAXIN) 500 mg in sodium chloride 0.9 % 100 mL IVPB     500 mg 200 mL/hr over 30 Minutes Intravenous 3 times per day 09/06/13 1310     09/03/13 1100  imipenem-cilastatin  (PRIMAXIN) 500 mg in sodium chloride 0.9 % 100 mL IVPB  Status:  Discontinued     500 mg 200 mL/hr over 30 Minutes Intravenous Every 6 hours 09/03/13 1007 09/06/13 1310   09/02/13 1300  vancomycin (VANCOCIN) IVPB 1000 mg/200 mL premix     1,000 mg 200 mL/hr over 60 Minutes Intravenous Every 12 hours 09/01/13 1527     08/30/13 2000  vancomycin (VANCOCIN) IVPB 750 mg/150 ml premix  Status:  Discontinued     750 mg 150 mL/hr over 60 Minutes Intravenous Every 8 hours 08/30/13 1356 09/01/13 1527   08/29/13 2200  micafungin (MYCAMINE) 100 mg in sodium chloride 0.9 % 100 mL IVPB     100 mg 100 mL/hr over 1 Hours Intravenous Every 24 hours 08/29/13 2036     08/29/13 0400  vancomycin (VANCOCIN) IVPB 1000 mg/200 mL premix  Status:  Discontinued     1,000 mg 200 mL/hr over 60 Minutes Intravenous Every 8 hours 08/28/13 2358 08/30/13 1356   08/29/13 0400  piperacillin-tazobactam (ZOSYN) IVPB 3.375 g  Status:  Discontinued     3.375 g 12.5 mL/hr over 240 Minutes Intravenous 3 times per day 08/28/13 2358 09/03/13 1002  08/28/13 1845  [MAR Hold]  vancomycin (VANCOCIN) IVPB 1000 mg/200 mL premix     (On MAR Hold since 08/28/13 1957)   1,000 mg 200 mL/hr over 60 Minutes Intravenous  Once 08/28/13 1831 08/28/13 1955   08/28/13 1845  [MAR Hold]  piperacillin-tazobactam (ZOSYN) IVPB 3.375 g     (On MAR Hold since 08/28/13 1957)   3.375 g 100 mL/hr over 30 Minutes Intravenous  Once 08/28/13 1831 08/28/13 2056      Assessment/Plan: s/p Procedure(s): EXPLORATORY LAPAROTOMY,debridment of nacrotic stomach,and primary repair of perforated stomach. (N/A) POD#10 FEN - start TF at 10cc/HR VDRF & sepsis - per CCM  LOS: 10 days    Shirley Decamp E 09/07/2013

## 2013-09-07 NOTE — Progress Notes (Signed)
ANTIBIOTIC CONSULT NOTE - Follow-up  Pharmacy Consult for primaxin, vancomycin, micafungin Indication: empiric lung and abdominal coverage  No Known Allergies  Patient Measurements: Height: 5\' 11"  (180.3 cm) Weight: 276 lb 10.8 oz (125.5 kg) IBW/kg (Calculated) : 75.3  Vital Signs: Temp: 97 F (36.1 C) (01/18 1140) Temp src: Axillary (01/18 1140) BP: 115/62 mmHg (01/18 1300) Pulse Rate: 98 (01/18 1300) Intake/Output from previous day: 01/17 0701 - 01/18 0700 In: 8269.4 [I.V.:4099.4; NG/GT:150; IV Piggyback:900; TPN:3120] Out: 7705 [Urine:7005; Emesis/NG output:650; Drains:50] Intake/Output from this shift: Total I/O In: 1772 [I.V.:1032; NG/GT:40; IV Piggyback:50; TPN:650] Out: 1905 [Urine:1800; Emesis/NG output:100; Drains:5]  Labs:  Recent Labs  09/05/13 0500 09/06/13 0408 09/07/13 0350  WBC 10.8* 12.2* 13.0*  HGB 11.2* 11.2* 10.9*  PLT 204 242 266  CREATININE 1.57* 2.01* 2.17*   Estimated Creatinine Clearance: 55.6 ml/min (by C-G formula based on Cr of 2.17).  Recent Labs  09/07/13 1230  VANCOTROUGH 33.3*     Microbiology: Recent Results (from the past 720 hour(s))  CULTURE, BLOOD (ROUTINE X 2)     Status: None   Collection Time    08/29/13 12:35 AM      Result Value Range Status   Specimen Description BLOOD RIGHT [place]   Final   Special Requests BOTTLES DRAWN AEROBIC ONLY 8CC   Final   Culture  Setup Time     Final   Value: 08/30/2013 05:14     Performed at [facility]   Culture     Final   Value: NO GROWTH 5 DAYS     Performed at [facility]   Report Status 09/05/2013 FINAL   Final  MRSA PCR SCREENING     Status: None   Collection Time    08/29/13  7:55 AM      Result Value Range Status   MRSA by PCR NEGATIVE  NEGATIVE Final   Comment:            The GeneXpert MRSA Assay (FDA     approved for NASAL specimens     only), is one component of a     comprehensive MRSA colonization     surveillance program. It is not     intended  to diagnose MRSA     infection nor to guide or     monitor treatment for     MRSA infections.  CULTURE, BLOOD (ROUTINE X 2)     Status: None   Collection Time    08/29/13 11:19 PM      Result Value Range Status   Specimen Description BLOOD RIGHT FOOT   Final   Special Requests BOTTLES DRAWN AEROBIC ONLY 0.5CC   Final   Culture  Setup Time     Final   Value: 08/30/2013 05:13     Performed at [facility]   Culture     Final   Value: NO GROWTH 5 DAYS     Performed at [facility]   Report Status 09/05/2013 FINAL   Final  CULTURE, RESPIRATORY (NON-EXPECTORATED)     Status: None   Collection Time    09/05/13 10:15 AM      Result Value Range Status   Specimen Description TRACHEAL ASPIRATE   Final   Special Requests Normal   Final   Gram Stain     Final   Value: NO WBC SEEN     NO SQUAMOUS EPITHELIAL CELLS SEEN     NO ORGANISMS SEEN  Performed at [facility]   Culture     Final   Value: NO GROWTH     Performed at [facility]   Report Status PENDING   Incomplete    Medications:  Anti-infectives   Start     Dose/Rate Route Frequency Ordered Stop   09/06/13 1600  imipenem-cilastatin (PRIMAXIN) 500 mg in sodium chloride 0.9 % 100 mL IVPB     500 mg 200 mL/hr over 30 Minutes Intravenous 3 times per day 09/06/13 1310     09/03/13 1100  imipenem-cilastatin (PRIMAXIN) 500 mg in sodium chloride 0.9 % 100 mL IVPB  Status:  Discontinued     500 mg 200 mL/hr over 30 Minutes Intravenous Every 6 hours 09/03/13 1007 09/06/13 1310   09/02/13 1300  vancomycin (VANCOCIN) IVPB 1000 mg/200 mL premix  Status:  Discontinued     1,000 mg 200 mL/hr over 60 Minutes Intravenous Every 12 hours 09/01/13 1527 09/07/13 1340   08/30/13 2000  vancomycin (VANCOCIN) IVPB 750 mg/150 ml premix  Status:  Discontinued     750 mg 150 mL/hr over 60 Minutes Intravenous Every 8 hours 08/30/13 1356 09/01/13 1527   08/29/13 2200  micafungin (MYCAMINE) 100 mg in sodium chloride 0.9  % 100 mL IVPB     100 mg 100 mL/hr over 1 Hours Intravenous Every 24 hours 08/29/13 2036     08/29/13 0400  vancomycin (VANCOCIN) IVPB 1000 mg/200 mL premix  Status:  Discontinued     1,000 mg 200 mL/hr over 60 Minutes Intravenous Every 8 hours 08/28/13 2358 08/30/13 1356   08/29/13 0400  piperacillin-tazobactam (ZOSYN) IVPB 3.375 g  Status:  Discontinued     3.375 g 12.5 mL/hr over 240 Minutes Intravenous 3 times per day 08/28/13 2358 09/03/13 1002   08/28/13 1845  [MAR Hold]  vancomycin (VANCOCIN) IVPB 1000 mg/200 mL premix     (On MAR Hold since 08/28/13 1957)   1,000 mg 200 mL/hr over 60 Minutes Intravenous  Once 08/28/13 1831 08/28/13 1955   08/28/13 1845  [MAR Hold]  piperacillin-tazobactam (ZOSYN) IVPB 3.375 g     (On MAR Hold since 08/28/13 1957)   3.375 g 100 mL/hr over 30 Minutes Intravenous  Once 08/28/13 1831 08/28/13 2056     Assessment: 47 yom presented to the hospital with abdominal pain. Now s/p debridement of necrotic stomach and repair of stomach perf. He has been on broad-spectrum antibiotics and antifungals since 1/8. His renal function has been fluctuating with a more recent decline. Primaxin dose was adjusted yesterday and a vancomycin trough was obtained today. Vancomycin trough was 33.283mcg/mL. SCr 2.17 with est CrCl ~755mL/min today- gradual decrease in renal function since 1/13.  Goal of Therapy:  Eradication of infection Vancomycin trough 15-6720mcg/mL  Plan:  1. Hold vancomycin- dose was not given at 1300 2. Random vancomycin level at 1230 tomorrow. Based on population estimates and patient's current renal function, estimated half-life is ~14hours. However with decline in CrCl of late, will obtain a level in 24 hours to ensure adequate clearance 3. Continue primaxin to 500mg  IV Q8H 4. Continue micafungin 100mg  IV Q24H 5. F/u renal fxn, C&S, clinical status and LOT  Hiroki Wint D. Kimbely Whiteaker, PharmD, BCPS Clinical Pharmacist Pager: [phone] 09/07/2013 1:56 PM

## 2013-09-08 ENCOUNTER — Inpatient Hospital Stay (HOSPITAL_COMMUNITY): Payer: Medicaid Other

## 2013-09-08 LAB — POCT I-STAT 3, ART BLOOD GAS (G3+)
ACID-BASE EXCESS: 7 mmol/L — AB (ref 0.0–2.0)
Acid-Base Excess: 13 mmol/L — ABNORMAL HIGH (ref 0.0–2.0)
BICARBONATE: 30.9 meq/L — AB (ref 20.0–24.0)
Bicarbonate: 35.4 mEq/L — ABNORMAL HIGH (ref 20.0–24.0)
O2 Saturation: 97 %
O2 Saturation: 92 %
PH ART: 7.627 — AB (ref 7.350–7.450)
PO2 ART: 50 mmHg — AB (ref 80.0–100.0)
Patient temperature: 97.9
Patient temperature: 98.4
TCO2: 32 mmol/L (ref 0–100)
TCO2: 36 mmol/L (ref 0–100)
pCO2 arterial: 41.3 mmHg (ref 35.0–45.0)
pCO2 arterial: 33.9 mmHg — ABNORMAL LOW (ref 35.0–45.0)
pH, Arterial: 7.48 — ABNORMAL HIGH (ref 7.350–7.450)
pO2, Arterial: 87 mmHg (ref 80.0–100.0)

## 2013-09-08 LAB — GLUCOSE, CAPILLARY
GLUCOSE-CAPILLARY: 137 mg/dL — AB (ref 70–99)
Glucose-Capillary: 161 mg/dL — ABNORMAL HIGH (ref 70–99)
Glucose-Capillary: 168 mg/dL — ABNORMAL HIGH (ref 70–99)
Glucose-Capillary: 172 mg/dL — ABNORMAL HIGH (ref 70–99)
Glucose-Capillary: 178 mg/dL — ABNORMAL HIGH (ref 70–99)
Glucose-Capillary: 116 mg/dL — ABNORMAL HIGH (ref 70–99)
Glucose-Capillary: 103 mg/dL — ABNORMAL HIGH (ref 70–99)

## 2013-09-08 LAB — COMPREHENSIVE METABOLIC PANEL
ALBUMIN: 1.5 g/dL — AB (ref 3.5–5.2)
ALT: 19 U/L (ref 0–53)
AST: 32 U/L (ref 0–37)
Alkaline Phosphatase: 134 U/L — ABNORMAL HIGH (ref 39–117)
BUN: 74 mg/dL — AB (ref 6–23)
CO2: 29 mEq/L (ref 19–32)
CREATININE: 2.23 mg/dL — AB (ref 0.50–1.35)
Calcium: 7.9 mg/dL — ABNORMAL LOW (ref 8.4–10.5)
Chloride: 94 mEq/L — ABNORMAL LOW (ref 96–112)
GFR calc Af Amer: 38 mL/min — ABNORMAL LOW (ref >90)
GFR calc non Af Amer: 33 mL/min — ABNORMAL LOW (ref >90)
Glucose, Bld: 188 mg/dL — ABNORMAL HIGH (ref 70–99)
Potassium: 3 mEq/L — ABNORMAL LOW (ref 3.7–5.3)
Sodium: 138 mEq/L (ref 137–147)
TOTAL PROTEIN: 6.5 g/dL (ref 6.0–8.3)
Total Bilirubin: 1 mg/dL (ref 0.3–1.2)

## 2013-09-08 LAB — PREALBUMIN: Prealbumin: 9.5 mg/dL — ABNORMAL LOW (ref 17.0–34.0)

## 2013-09-08 LAB — CBC
HCT: 31.2 % — ABNORMAL LOW (ref 39.0–52.0)
HEMOGLOBIN: 10.5 g/dL — AB (ref 13.0–17.0)
MCH: 29.9 pg (ref 26.0–34.0)
MCHC: 33.7 g/dL (ref 30.0–36.0)
MCV: 88.9 fL (ref 78.0–100.0)
Platelets: 337 10*3/uL (ref 150–400)
RBC: 3.51 MIL/uL — AB (ref 4.22–5.81)
RDW: 14.6 % (ref 11.5–15.5)
WBC: 14 10*3/uL — ABNORMAL HIGH (ref 4.0–10.5)

## 2013-09-08 LAB — DIFFERENTIAL
Basophils Absolute: 0 10*3/uL (ref 0.0–0.1)
Basophils Relative: 0 % (ref 0–1)
EOS ABS: 0 10*3/uL (ref 0.0–0.7)
EOS PCT: 0 % (ref 0–5)
LYMPHS PCT: 12 % (ref 12–46)
Lymphs Abs: 1.7 10*3/uL (ref 0.7–4.0)
MONO ABS: 0.7 10*3/uL (ref 0.1–1.0)
Monocytes Relative: 5 % (ref 3–12)
NEUTROS PCT: 83 % — AB (ref 43–77)
Neutro Abs: 11.6 10*3/uL — ABNORMAL HIGH (ref 1.7–7.7)

## 2013-09-08 LAB — MAGNESIUM: MAGNESIUM: 2 mg/dL (ref 1.5–2.5)

## 2013-09-08 LAB — PHOSPHORUS: Phosphorus: 4.9 mg/dL — ABNORMAL HIGH (ref 2.3–4.6)

## 2013-09-08 LAB — VANCOMYCIN, RANDOM: Vancomycin Rm: 23.8 ug/mL

## 2013-09-08 LAB — TRIGLYCERIDES: Triglycerides: 223 mg/dL — ABNORMAL HIGH (ref <150)

## 2013-09-08 MED ORDER — POTASSIUM CHLORIDE 20 MEQ/15ML (10%) PO LIQD
40.0000 meq | ORAL | Status: AC
  Administered 2013-09-08 (×2): 40 meq
  Filled 2013-09-08 (×3): qty 30

## 2013-09-08 MED ORDER — HALOPERIDOL LACTATE 5 MG/ML IJ SOLN
4.0000 mg | Freq: Once | INTRAMUSCULAR | Status: AC
  Administered 2013-09-08: 4 mg via INTRAVENOUS
  Filled 2013-09-08: qty 1

## 2013-09-08 MED ORDER — HALOPERIDOL LACTATE 5 MG/ML IJ SOLN: 4.0000 mg | Freq: Once | INTRAMUSCULAR | Status: DC

## 2013-09-08 MED ORDER — INSULIN ASPART 100 UNIT/ML ~~LOC~~ SOLN
0.0000 [IU] | SUBCUTANEOUS | Status: DC
Start: 2013-09-08 — End: 2013-09-16
  Administered 2013-09-08 – 2013-09-12 (×13): 2 [IU] via SUBCUTANEOUS
  Administered 2013-09-12: 3 [IU] via SUBCUTANEOUS
  Administered 2013-09-12: 2 [IU] via SUBCUTANEOUS
  Administered 2013-09-13: 3 [IU] via SUBCUTANEOUS
  Administered 2013-09-13: 2 [IU] via SUBCUTANEOUS
  Administered 2013-09-13: 3 [IU] via SUBCUTANEOUS
  Administered 2013-09-13: 2 [IU] via SUBCUTANEOUS
  Administered 2013-09-13: 3 [IU] via SUBCUTANEOUS
  Administered 2013-09-14 (×3): 2 [IU] via SUBCUTANEOUS

## 2013-09-08 MED ORDER — LORAZEPAM 2 MG/ML IJ SOLN
1.0000 mg | Freq: Once | INTRAMUSCULAR | Status: AC
  Administered 2013-09-08: 1 mg via INTRAVENOUS
  Filled 2013-09-08: qty 1

## 2013-09-08 MED ORDER — HYDROCORTISONE SOD SUCCINATE 100 MG IJ SOLR
50.0000 mg | Freq: Two times a day (BID) | INTRAMUSCULAR | Status: DC
  Administered 2013-09-08: 50 mg via INTRAVENOUS
  Filled 2013-09-08 (×3): qty 1

## 2013-09-08 MED ORDER — MIDAZOLAM BOLUS VIA INFUSION
2.0000 mg | Freq: Once | INTRAVENOUS | Status: AC
  Administered 2013-09-08: 2 mg via INTRAVENOUS

## 2013-09-08 MED ORDER — LORAZEPAM 2 MG/ML IJ SOLN
1.0000 mg | INTRAMUSCULAR | Status: DC | PRN
  Administered 2013-09-08: 1 mg via INTRAVENOUS
  Filled 2013-09-08: qty 1

## 2013-09-08 MED ORDER — DEXMEDETOMIDINE HCL IN NACL 400 MCG/100ML IV SOLN
0.0000 ug/kg/h | INTRAVENOUS | Status: DC
  Administered 2013-09-08 (×4): 1.2 ug/kg/h via INTRAVENOUS
  Filled 2013-09-08: qty 100
  Filled 2013-09-08: qty 50
  Filled 2013-09-08 (×2): qty 100
  Filled 2013-09-08: qty 50

## 2013-09-08 MED ORDER — FAT EMULSION 20 % IV EMUL
240.0000 mL | INTRAVENOUS | Status: AC
  Administered 2013-09-08: 240 mL via INTRAVENOUS
  Filled 2013-09-08: qty 250

## 2013-09-08 MED ORDER — DEXMEDETOMIDINE HCL IN NACL 200 MCG/50ML IV SOLN
0.2000 ug/kg/h | INTRAVENOUS | Status: AC
  Administered 2013-09-09 (×2): 0.7 ug/kg/h via INTRAVENOUS
  Administered 2013-09-09: 0.2 ug/kg/h via INTRAVENOUS
  Administered 2013-09-09: 0.4 ug/kg/h via INTRAVENOUS
  Administered 2013-09-09 (×2): 0.7 ug/kg/h via INTRAVENOUS
  Administered 2013-09-09 (×2): 0.5 ug/kg/h via INTRAVENOUS
  Administered 2013-09-09: 0.7 ug/kg/h via INTRAVENOUS
  Administered 2013-09-09: 0.5 ug/kg/h via INTRAVENOUS
  Administered 2013-09-09 – 2013-09-10 (×2): 0.7 ug/kg/h via INTRAVENOUS
  Administered 2013-09-10: 0.5 ug/kg/h via INTRAVENOUS
  Administered 2013-09-10: 0.7 ug/kg/h via INTRAVENOUS
  Filled 2013-09-08: qty 100
  Filled 2013-09-08 (×11): qty 50
  Filled 2013-09-08: qty 100

## 2013-09-08 MED ORDER — TRACE MINERALS CR-CU-F-FE-I-MN-MO-SE-ZN IV SOLN
INTRAVENOUS | Status: AC
  Administered 2013-09-08: 17:00:00 via INTRAVENOUS
  Filled 2013-09-08: qty 2400

## 2013-09-08 MED ORDER — FENTANYL CITRATE 0.05 MG/ML IJ SOLN
25.0000 ug | INTRAMUSCULAR | Status: DC | PRN
  Administered 2013-09-08 (×4): 100 ug via INTRAVENOUS
  Administered 2013-09-09 (×2): 25 ug via INTRAVENOUS
  Administered 2013-09-09 (×3): 100 ug via INTRAVENOUS
  Administered 2013-09-09: 50 ug via INTRAVENOUS
  Administered 2013-09-09 – 2013-09-12 (×5): 100 ug via INTRAVENOUS
  Administered 2013-09-13: 50 ug via INTRAVENOUS
  Administered 2013-09-13 – 2013-09-20 (×5): 100 ug via INTRAVENOUS
  Administered 2013-09-20 – 2013-09-21 (×3): 50 ug via INTRAVENOUS
  Administered 2013-09-21 (×2): 100 ug via INTRAVENOUS
  Administered 2013-09-25 – 2013-09-27 (×3): 50 ug via INTRAVENOUS
  Filled 2013-09-08 (×28): qty 2

## 2013-09-08 NOTE — Progress Notes (Signed)
eLink Physician-Brief Progress Note Patient Name: James LeydenChristopher D Mccormick DOB: 02/20/1966 MRN: [id number]  Date of Service  09/08/2013   HPI/Events of Note  Hypokalemia  eICU Interventions  Potassium replaced   Intervention Category Intermediate Interventions: Electrolyte abnormality - evaluation and management  DETERDING,[name] 09/08/2013, 5:12 AM

## 2013-09-08 NOTE — Progress Notes (Signed)
NUTRITION CONSULT/FOLLOW UP  Intervention:    TPN per pharmacy Advance diet as medically appropriate  RD to follow for nutrition care plan  Nutrition Dx:   Inadequate oral intake related to inability to eat, s/p extubation as evidenced by NPO status, ongoing  Goal:   Pt to meet >/= 90% of their estimated nutrition needs, met  Monitor:   TPN prescription, PO diet advancement, weight, labs, I/O's  Assessment:   Patient with PMH of GERD, HTN presented with severe abdominal pain associated with nausea & vomiting; had been taking Alleve and large amount of Goody's powder.  CT scan revealed perforated stomach ulcer due to NSAID use.   Patient s/p procedures 1/8:  EXPLORATORY LAPAROTOMY  DEBRIDEMENT OF NECROTIC STOMACH  REPAIR OF PERFORATED STOMACH  RD consulted for EN initiation & management.  Patient extubated this AM.  OGT out.  Pivot 1.5 formula off.  Plan is for contrast study to evaluate gastric repair once stable. Will likely need CVVHD for volume control if not responding to Lasix.   Patient receiving TPN with Clinimix E 5/15 @ 120 ml/hr and lipids @ 10 ml/hr. Provides 2525 kcal, 144 gm protein daily. Meets 100% minimum estimated energy needs and 96% minimum estimated protein needs.  Height: 08/28/13   '5\' 11"'  (1.803 m)    Weight Status:   Wt Readings from Last 1 Encounters:  09/08/13 261 lb 3.9 oz (118.5 kg)    Re-estimated needs:  Kcal: 2250-2450 Protein: 150-160 gm Fluid: per MD  Skin: abdominal surgical incision   Diet Order: NPO   Intake/Output Summary (Last 24 hours) at 09/08/13 1046 Last data filed at 09/08/13 0900  Gross per 24 hour  Intake 5593.77 ml  Output   9868 ml  Net -4274.23 ml    Labs:   Recent Labs Lab 09/06/13 0408 09/06/13 1600 09/07/13 0350 09/08/13 0400  NA 137  --  137 138  K 4.0 4.3 3.8 3.0*  CL 98  --  95* 94*  CO2 25  --  25 29  BUN 40*  --  56* 74*  CREATININE 2.01*  --  2.17* 2.23*  CALCIUM 7.7*  --  7.8* 7.9*  MG  2.1  --  2.2 2.0  PHOS 5.5*  --  5.6* 4.9*  GLUCOSE 120*  --  174* 188*    CBG (last 3)   Recent Labs  09/06/13 2316 09/07/13 0721 09/07/13 1138  GLUCAP 169* 155* 162*    Scheduled Meds: . antiseptic oral rinse  15 mL Mouth Rinse QID  . artificial tears  1 application Both Eyes V0J  . chlorhexidine  15 mL Mouth/Throat BID  . enoxaparin (LOVENOX) injection  40 mg Subcutaneous Q24H  . hydrocortisone sodium succinate  50 mg Intravenous Q12H  . imipenem-cilastatin  500 mg Intravenous Q8H  . insulin aspart  0-15 Units Subcutaneous Q4H  . micafungin (MYCAMINE) IV  100 mg Intravenous Q24H  . pantoprazole (PROTONIX) IV  40 mg Intravenous Q12H    Continuous Infusions: . sodium chloride 10 mL/hr at 09/08/13 0800  . dexmedetomidine 1.2 mcg/kg/hr (09/08/13 0915)  . fat emulsion 480 kcal (09/08/13 0600)  . Marland KitchenTPN (CLINIMIX-E) Adult     And  . fat emulsion    . furosemide (LASIX) infusion 8 mg/hr (09/08/13 0900)  . TPN (CLINIMIX) Adult without lytes 120 mL/hr at 09/08/13 0600    Arthur Holms, RD, LDN Pager #: [phone] After-Hours Pager #: [phone]

## 2013-09-08 NOTE — Progress Notes (Signed)
eLink Physician-Brief Progress Note Patient Name: James LeydenChristopher D Mccormick DOB: 07/04/1966 MRN: [id number]  Date of Service  09/08/2013   HPI/Events of Note  Severe agitation despite fentanyl and versed gtts at max dose.  Hypertensive, moving in bed, restrained.   eICU Interventions  Plan: Start precedex for sedation  Continue fentanyl but reduce dose D/C cont versed Continue with prn versed   Intervention Category Major Interventions: Delirium, psychosis, severe agitation - evaluation and management  James Mccormick 09/08/2013, 12:28 AM

## 2013-09-08 NOTE — Progress Notes (Signed)
Chaplain offered emotional and spiritual support to pt, pt's daughter, and pt's significant other. Pt's daughter was very relieved and excited that her father was extubated, saying, "This is the best I've seen him." She said her birthday was over the weekend and her father's improvement "was everything she had been wishing for." Chaplain provided emotional and spiritual support, caring presence, and empathic listening.   Guy SandiferHillary D McLeanrusta, IowaChaplain [phone] General: [phone]

## 2013-09-08 NOTE — Progress Notes (Signed)
11 Days Post-Op  Subjective: On vent  Objective: Vital signs in last 24 hours: Temp:  [97 F (36.1 C)-98.3 F (36.8 C)] 98.3 F (36.8 C) (01/19 0716) Pulse Rate:  [71-118] 81 (01/19 0740) Resp:  [5-37] 17 (01/19 0740) BP: (102-184)/(60-109) 135/64 mmHg (01/19 0740) SpO2:  [90 %-98 %] 98 % (01/19 0740) Arterial Line BP: (97-216)/(53-109) 141/69 mmHg (01/19 0700) FiO2 (%):  [40 %-50 %] 40 % (01/19 0740) Weight:  [261 lb 3.9 oz (118.5 kg)] 261 lb 3.9 oz (118.5 kg) (01/19 0400) Last BM Date:  (unknown)  Intake/Output from previous day: 01/18 0701 - 01/19 0700 In: 6242.7 [I.V.:2362.7; NG/GT:340; IV Piggyback:550; TPN:2990] Out: 9773 [Urine:9650; Emesis/NG output:100; Drains:23] Intake/Output this shift:    Abdomen soft, drains with minimal purulent fluid  Lab Results:   Recent Labs  09/07/13 0350 09/08/13 0400  WBC 13.0* 14.0*  HGB 10.9* 10.5*  HCT 32.1* 31.2*  PLT 266 337   BMET  Recent Labs  09/07/13 0350 09/08/13 0400  NA 137 138  K 3.8 3.0*  CL 95* 94*  CO2 25 29  GLUCOSE 174* 188*  BUN 56* 74*  CREATININE 2.17* 2.23*  CALCIUM 7.8* 7.9*   PT/INR No results found for this basename: LABPROT, INR,  in the last 72 hours ABG  Recent Labs  09/07/13 0350 09/08/13 0414  PHART 7.362 7.480*  HCO3 29.4* 30.9*    Studies/Results: Dg Chest Port 1 View  09/08/2013   CLINICAL DATA:  Intubated, ventilatory support  EXAM: PORTABLE CHEST - 1 VIEW  COMPARISON:  09/07/2013  FINDINGS: Endotracheal tube 5 cm above the carina. Right IJ central line tip at the upper SVC level. NG tube coiled within the stomach. Low lung volumes persist with basilar atelectasis. Right base consolidation again evident, minimally improved. Suspect small right effusion as well. No pneumothorax. Degenerative changes of the spine.  IMPRESSION: Persistent low lung volumes with basilar atelectasis and right base patchy consolidation. Minimal improvement.   Electronically Signed   By: Ruel Favors  M.D.   On: 09/08/2013 07:46   Dg Chest Port 1 View  09/07/2013   CLINICAL DATA:  Evaluate endotracheal tube.  EXAM: PORTABLE CHEST - 1 VIEW  COMPARISON:  09/06/2013  FINDINGS: Endotracheal tube is 4.3 cm above the carina. Nasogastric tube extends into the abdomen. Persistent low lung volumes with right basilar densities. Right basilar densities may represent atelectasis or consolidation. Heart size is likely within normal limits based on the technique and low lung volumes. Again noted are patchy interstitial densities in both lungs. Central venous catheter in the upper SVC region. No evidence for a pneumothorax.  IMPRESSION: Persistent low lung volumes with right basilar densities. Right basilar densities could represent a combination of consolidation and pleural fluid.  Interstitial lung markings may be related to low lung volumes versus mild edema.   Electronically Signed   By: Richarda Overlie M.D.   On: 09/07/2013 08:16    Anti-infectives: Anti-infectives   Start     Dose/Rate Route Frequency Ordered Stop   09/06/13 1600  imipenem-cilastatin (PRIMAXIN) 500 mg in sodium chloride 0.9 % 100 mL IVPB     500 mg 200 mL/hr over 30 Minutes Intravenous 3 times per day 09/06/13 1310     09/03/13 1100  imipenem-cilastatin (PRIMAXIN) 500 mg in sodium chloride 0.9 % 100 mL IVPB  Status:  Discontinued     500 mg 200 mL/hr over 30 Minutes Intravenous Every 6 hours 09/03/13 1007 09/06/13 1310   09/02/13 1300  vancomycin (VANCOCIN) IVPB 1000 mg/200 mL premix  Status:  Discontinued     1,000 mg 200 mL/hr over 60 Minutes Intravenous Every 12 hours 09/01/13 1527 09/07/13 1340   08/30/13 2000  vancomycin (VANCOCIN) IVPB 750 mg/150 ml premix  Status:  Discontinued     750 mg 150 mL/hr over 60 Minutes Intravenous Every 8 hours 08/30/13 1356 09/01/13 1527   08/29/13 2200  micafungin (MYCAMINE) 100 mg in sodium chloride 0.9 % 100 mL IVPB     100 mg 100 mL/hr over 1 Hours Intravenous Every 24 hours 08/29/13 2036      08/29/13 0400  vancomycin (VANCOCIN) IVPB 1000 mg/200 mL premix  Status:  Discontinued     1,000 mg 200 mL/hr over 60 Minutes Intravenous Every 8 hours 08/28/13 2358 08/30/13 1356   08/29/13 0400  piperacillin-tazobactam (ZOSYN) IVPB 3.375 g  Status:  Discontinued     3.375 g 12.5 mL/hr over 240 Minutes Intravenous 3 times per day 08/28/13 2358 09/03/13 1002   08/28/13 1845  [MAR Hold]  vancomycin (VANCOCIN) IVPB 1000 mg/200 mL premix     (On MAR Hold since 08/28/13 1957)   1,000 mg 200 mL/hr over 60 Minutes Intravenous  Once 08/28/13 1831 08/28/13 1955   08/28/13 1845  [MAR Hold]  piperacillin-tazobactam (ZOSYN) IVPB 3.375 g     (On MAR Hold since 08/28/13 1957)   3.375 g 100 mL/hr over 30 Minutes Intravenous  Once 08/28/13 1831 08/28/13 2056      Assessment/Plan: s/p Procedure(s): EXPLORATORY LAPAROTOMY,debridment of nacrotic stomach,and primary repair of perforated stomach. (N/A)  Continues to have increasing WBC and base deficit.  Has pneumonia by CT on 1/16.  If WBC continues to increase, may need to repeat the CT vs re-explore his abdomen.  LOS: 11 days    Keesha Pellum A 09/08/2013

## 2013-09-08 NOTE — Procedures (Signed)
Extubation Procedure Note  Patient Details:   Name: James Mccormick DOB: 08/25/1965 MRN: [id number]   Airway Documentation:     Evaluation  O2 sats: stable throughout Complications: No apparent complications Patient did tolerate procedure well. Bilateral Breath Sounds: Clear;Diminished Suctioning: Airway Yes, Pt extubated with no complications, pt placed on 4lpm Sp02 95%, HR 100, 19  James Spryelson, [name] 09/08/2013, 10:29 AM

## 2013-09-08 NOTE — Progress Notes (Signed)
UR Completed.  Dametra Whetsel Jane [phone] 09/08/2013  

## 2013-09-08 NOTE — Progress Notes (Signed)
eLink Physician-Brief Progress Note Patient Name: James LeydenChristopher D Mccormick DOB: 11/08/1965 MRN: [id number]  Date of Service  09/08/2013   HPI/Events of Note   Agitated  eICU Interventions  Haldol 4 mg IV x1.      YACOUB,WESAM 09/08/2013, 9:43 PM

## 2013-09-08 NOTE — Progress Notes (Addendum)
Name: James Mccormick MRN: [id number] DOB: March 21, 1966    ADMISSION DATE:  08/28/2013 CONSULTATION DATE:  08/28/13 LOS 11 days  REFERRING MD :  Andrey Campanile PRIMARY SERVICE: [facility]CS  CHIEF COMPLAINT:  Abdominal Pain  BRIEF PATIENT DESCRIPTION:  48 yo male smoker presented with abdominal pain from perforated gastric ulcer in setting of NSAID.  PCCM consulted to assist with septic shock and respiratory failure management.  SIGNIFICANT EVENTS: 1/8: Exploratory laparotomy, debridement of necrotic stomach with scissors, primary repair of perforated stomach in 2 layers.  Septic shock, VDRF, a fib post-op. 1/9: Cardiology consulted . ECHO EF 45% 1/11: still pressor dependent, NSR for > 24 hrs. Amiodarone stopped.  1/12: remain son levophed,. Left radial a line dc'ed 09/02/13: off levophed. Encephalopathic - randomly agitated. Mmit 08/28/2013 . TPN continuesoving around, biting tube, looks randomly, does not track. Failed SBT. Febrile. + 24 Liters. Back on neo 09/03/13: ARDS Protocol with 48h nimbex started. Left radial artery occluded - seen by VVS. Expectant mgmt. 1/15: 50% fio2 on ARDS protocol with nimbex. Left radial artery occlusion events noted. 1/17 off paralytics  STUDIES:  1/8 CT abd/pelvis >> pneumoperitoneum, Lt kidney stone, diverticulosis 1/9 Echo >> mild LVH, EF 40 to 45%, grade 1 diastolic dysfx 1/14 Lt upper ext arterial duplex >> no evidence for thrombosis in Lt arm 1/16 CT abd/pelvis >> b/l pleural effusions, areas of GGO and consolidation, no leak from stomach or small bowel  LINES / TUBES: 1/8 ETT >>  1/8 Rt IJ CVL >> 1/8 Lt radial aline >>>1/12 1/14 Rt radial aline>>1/19  CULTURES: Blood 1/9 >> negative Tracheal aspirate 1/16 >> negative  ANTIBIOTICS: Vancomycin 1/8 >>> Zosyn 1/8 >>>>1/14 Micafungin 1/9 >>> Imipenem 1/14 >>>  SUBJECTIVE:  Tolerating SBT.  Agitated with WUA.  Off pressors.  VITAL SIGNS: Temp:  [97 F (36.1 C)-98.3 F (36.8 C)] 98.3 F (36.8 C)  (01/19 0716) Pulse Rate:  [71-118] 71 (01/19 0900) Resp:  [5-37] 12 (01/19 0900) BP: (103-184)/(60-109) 119/77 mmHg (01/19 0900) SpO2:  [90 %-99 %] 98 % (01/19 0900) Arterial Line BP: (97-216)/(53-109) 129/74 mmHg (01/19 0900) FiO2 (%):  [40 %-50 %] 40 % (01/19 0900) Weight:  [261 lb 3.9 oz (118.5 kg)] 261 lb 3.9 oz (118.5 kg) (01/19 0400) HEMODYNAMICS: CVP:  [12 mmHg-14 mmHg] 14 mmHg VENTILATOR SETTINGS: Vent Mode:  [-] CPAP;PSV FiO2 (%):  [40 %-50 %] 40 % Set Rate:  [34 bmp] 34 bmp Vt Set:  [450 mL] 450 mL PEEP:  [5 cmH20] 5 cmH20 Pressure Support:  [5 cmH20] 5 cmH20 Plateau Pressure:  [19 cmH20-31 cmH20] 19 cmH20 INTAKE / OUTPUT: Intake/Output     01/18 0701 - 01/19 0700 01/19 0701 - 01/20 0700   I.V. (mL/kg) 2448.4 (20.7) 191.4 (1.6)   NG/GT 350 50   IV Piggyback 550    TPN 3120 260   Total Intake(mL/kg) 6468.4 (54.6) 501.4 (4.2)   Urine (mL/kg/hr) 9650 (3.4) 1200 (3.3)   Emesis/NG output 100 (0)    Drains 23 (0)    Total Output 9773 1200   Net -3304.6 -698.6          PHYSICAL EXAMINATION: General: no distress Neuro: moves extremities with WUA HEENT: ETT, NG in place Cardiovascular: regular Lungs: scattered rhonchi Abdomen: wound dressing clean Musculoskeletal: anasarca Skin: no rashes  LABS: PULMONARY  Recent Labs Lab 09/04/13 0538 09/05/13 0433 09/06/13 0346 09/07/13 0350 09/08/13 0414  PHART 7.345* 7.332* 7.328* 7.362 7.480*  PCO2ART 51.6* 53.4* 51.7* 51.7* 41.3  PO2ART 62.0*  64.0* 79.0* 66.0* 87.0  HCO3 28.0* 28.1* 26.8* 29.4* 30.9*  TCO2 30 30 28 31  32  O2SAT 89.0 89.0 93.0 92.0 97.0   CBC  Recent Labs Lab 09/06/13 0408 09/07/13 0350 09/08/13 0400  HGB 11.2* 10.9* 10.5*  HCT 33.7* 32.1* 31.2*  WBC 12.2* 13.0* 14.0*  PLT 242 266 337   COAGULATION  Recent Labs Lab 09/04/13 0358  INR 1.04   CARDIAC    Recent Labs Lab 09/03/13 0420  TROPONINI <0.30    Recent Labs Lab 09/03/13 0420 09/05/13 0500 09/06/13 0408  PROBNP  358.1* 909.6* 986.2*   CHEMISTRY  Recent Labs Lab 09/04/13 0358 09/05/13 0500 09/06/13 0408  09/07/13 0350 09/08/13 0400  NA 140 139 137  --  137 138  K 3.6* 3.6* 4.0  < > 3.8 3.0*  CL 101 102 98  --  95* 94*  CO2 25 24 25   --  25 29  GLUCOSE 125* 113* 120*  --  174* 188*  BUN 29* 31* 40*  --  56* 74*  CREATININE 1.38* 1.57* 2.01*  --  2.17* 2.23*  CALCIUM 7.6* 7.4* 7.7*  --  7.8* 7.9*  MG 1.9 2.0 2.1  --  2.2 2.0  PHOS 5.1* 5.3* 5.5*  --  5.6* 4.9*  < > = values in this interval not displayed. Estimated Creatinine Clearance: 52.5 ml/min (by C-G formula based on Cr of 2.23).  LIVER  Recent Labs Lab 09/04/13 0358 09/08/13 0400  AST 28 32  ALT 21 19  ALKPHOS 62 134*  BILITOT 1.0 1.0  PROT 4.9* 6.5  ALBUMIN 1.4* 1.5*  INR 1.04  --    INFECTIOUS  Recent Labs Lab 09/03/13 0421 09/03/13 0959 09/04/13 0358 09/05/13 0500  LATICACIDVEN 1.6  --  1.4  --   PROCALCITON  --  24.32 15.05 11.21   ENDOCRINE CBG (last 3)   Recent Labs  09/06/13 2316 09/07/13 0721 09/07/13 1138  GLUCAP 169* 155* 162*   IMAGING x48h  Dg Chest Port 1 View  09/08/2013   CLINICAL DATA:  Intubated, ventilatory support  EXAM: PORTABLE CHEST - 1 VIEW  COMPARISON:  09/07/2013  FINDINGS: Endotracheal tube 5 cm above the carina. Right IJ central line tip at the upper SVC level. NG tube coiled within the stomach. Low lung volumes persist with basilar atelectasis. Right base consolidation again evident, minimally improved. Suspect small right effusion as well. No pneumothorax. Degenerative changes of the spine.  IMPRESSION: Persistent low lung volumes with basilar atelectasis and right base patchy consolidation. Minimal improvement.   Electronically Signed   By: Ruel Favorsrevor  Shick M.D.   On: 09/08/2013 07:46   Dg Chest Port 1 View  09/07/2013   CLINICAL DATA:  Evaluate endotracheal tube.  EXAM: PORTABLE CHEST - 1 VIEW  COMPARISON:  09/06/2013  FINDINGS: Endotracheal tube is 4.3 cm above the carina.  Nasogastric tube extends into the abdomen. Persistent low lung volumes with right basilar densities. Right basilar densities may represent atelectasis or consolidation. Heart size is likely within normal limits based on the technique and low lung volumes. Again noted are patchy interstitial densities in both lungs. Central venous catheter in the upper SVC region. No evidence for a pneumothorax.  IMPRESSION: Persistent low lung volumes with right basilar densities. Right basilar densities could represent a combination of consolidation and pleural fluid.  Interstitial lung markings may be related to low lung volumes versus mild edema.   Electronically Signed   By: Meriel PicaAdam  Henn M.D.  On: 09/07/2013 08:16   ASSESSMENT / PLAN:  PULMONARY A:  Acute respiratory failure in setting of peritonitis. Acute lung injury. B/L pleural effusions with passive atelectasis. Hx of smoking >> air trapping on vent 1/10. P: -proceed with extubation 1/19 -oxygen to keep SpO2 > 92% -prn BiPAP after extubation  CARDIAC A:  Septic shock 2nd to peritonitis >> resolved.. A fib with RVR >>resolved. Was on IV amiodarone 1/9 to 1/10. Acute systolic heart failure likely 2nd to sepsis. NSTEMI from demand ischemia. Hx of HTN with chronic diastolic dysfx. P:  -monitor hemodynamics -will need cardiac evaluation and f/u Echo at some point  RENAL A:   Acute kidney injury 2nd to hypovolemia, and ATN in setting of shock. Metabolic acidosis >> resolved. Hyperkalemia. Hypervolemia. P:   -continue lasix gtt for now -f/u renal fx, urine outpt -f/u and replace electrolytes as needed  GASTROINTESTINAL A:   Perforated gastric ulcer in setting of NSAID use as outpt. Protein calorie malnutrition. P:   -Post -op care per CCS -Protonix BID -Continue TNA per CCS -advance oral feedings as tolerate after extubation per [facility]CS  HEMATOLOGIC A:   Anemia of critical illness Low platelet 2nd to sepsis; HIT negative 1/13 >>  resolved. P:  -f/u CBC -SQ lovenox for DVT prevention.  INFECTIOUS A:  Septic shock 2nd to peritonitis. Recurrent fever with possible HCAP 1/14. P:   -Day 12 of Abx, day 12 of vancomycin day 10 of micafungin, day 6 of imipenem  ENDOCRINE A:   Hyperglycemia. Relative adrenal insufficiency in setting of sepsis >> cortisol 14.7 from 1/17. P:   -SSI  -start to wean of solu cortef as tolerated 1/19  NEUROLOGIC A:   Acute encephalopathy 2nd to septic shock. P:   -prn fentanyl after extubation  CC time 40 minutes.  Coralyn Helling, MD [facility] Pulmonary/Critical Care 09/08/2013, 10:18 AM Pager:  [phone] After 3pm call: [phone]

## 2013-09-08 NOTE — Progress Notes (Signed)
eLink Physician-Brief Progress Note Patient Name: James LeydenChristopher D Mccormick DOB: 01/26/1966 MRN: [id number]  Date of Service  09/08/2013   HPI/Events of Note  Anxious   eICU Interventions  Ativan x1.      YACOUB,WESAM 09/08/2013, 4:26 PM

## 2013-09-08 NOTE — Progress Notes (Addendum)
PARENTERAL NUTRITION CONSULT NOTE - FOLLOW UP  Pharmacy Consult for TPN Indication: Prolonged ileus  No Known Allergies  Patient Measurements: Height: 5\' 11"  (180.3 cm) Weight: 261 lb 3.9 oz (118.5 kg) IBW/kg (Calculated) : 75.3 Vital Signs: Temp: 98.3 F (36.8 C) (01/19 0716) Temp src: Oral (01/19 0716) BP: 127/81 mmHg (01/19 0700) Pulse Rate: 72 (01/19 0700) Intake/Output from previous day: 01/18 0701 - 01/19 0700 In: 6242.7 [I.V.:2362.7; NG/GT:340; IV Piggyback:550; TPN:2990] Out: 9773 [Urine:9650; Emesis/NG output:100; Drains:23] Intake/Output from this shift:    Labs:  Recent Labs  09/06/13 0408 09/07/13 0350 09/08/13 0400  WBC 12.2* 13.0* 14.0*  HGB 11.2* 10.9* 10.5*  HCT 33.7* 32.1* 31.2*  PLT 242 266 337     Recent Labs  09/06/13 0408 09/06/13 1600 09/07/13 0350 09/08/13 0400  NA 137  --  137 138  K 4.0 4.3 3.8 3.0*  CL 98  --  95* 94*  CO2 25  --  25 29  GLUCOSE 120*  --  174* 188*  BUN 40*  --  56* 74*  CREATININE 2.01*  --  2.17* 2.23*  CALCIUM 7.7*  --  7.8* 7.9*  MG 2.1  --  2.2 2.0  PHOS 5.5*  --  5.6* 4.9*  PROT  --   --   --  6.5  ALBUMIN  --   --   --  1.5*  AST  --   --   --  32  ALT  --   --   --  19  ALKPHOS  --   --   --  134*  BILITOT  --   --   --  1.0  TRIG  --   --   --  223*   Estimated Creatinine Clearance: 52.5 ml/min (by C-G formula based on Cr of 2.23).    Recent Labs  09/06/13 2316 09/07/13 0721 09/07/13 1138  GLUCAP 169* 155* 162*    Insulin Requirements in the past 24 hours:  8 units Novolog ICU-SSI and 20 units in TPN,   Current Nutrition:  Pivot 1.5 at 10 ml/hr  Assessment:  48 y.o. male presented to ED 1/8 with abd pain. CT consistent with perforated stomach ulcer due to NSAID use; pneumoperitoneum; peritonitis. Taken emergently to OR where he underwent exp lap, debridement of necrotic stomach, repair of perforated stomach. Contrast study showed no leak from stomach or small bowel.   GI: NPO.   Hypoactive BS, NG with bilious output  Prealb pending Endo: No h/o DM. CBGs 155-169 Lytes: Na 138 K 3.0, Phos 4.9 , mag 2.0;  Corr Ca 9.9 CoCa X phos = 48.51   Can add lytes back to TPN today  MD repleting K this am Renal: Cr  Up to 2.23  UOP 3.5 ml/Kg/hr on lasix drip Hepatobil: LFTs wnl   TG 223 TPN Access: CVC 08/29/13  TPN day#: 10  Nutritional Goals:  Goal 2500 kcal and 150-160gm protein per RD recommendations 09/02/13.   Plan:  1. Change to Clinimix  5/15 with electrolytes  Continue lipids at 10 ml/hr.  2. Dec rate to 100 ml/hr. 3.  Keep insulin at 20 units/bag 4. F/u advancement of TFs 5.  F/u am labs  FosstonLora Tenleigh Byer, Vermont[place]arm.D. [phone] 09/08/2013 7:32 AM

## 2013-09-09 ENCOUNTER — Inpatient Hospital Stay (HOSPITAL_COMMUNITY): Payer: Medicaid Other

## 2013-09-09 LAB — PHOSPHORUS: PHOSPHORUS: 4.7 mg/dL — AB (ref 2.3–4.6)

## 2013-09-09 LAB — POCT I-STAT 3, ART BLOOD GAS (G3+)
Acid-Base Excess: 15 mmol/L — ABNORMAL HIGH (ref 0.0–2.0)
Bicarbonate: 38.6 mEq/L — ABNORMAL HIGH (ref 20.0–24.0)
O2 Saturation: 98 %
PCO2 ART: 41.1 mmHg (ref 35.0–45.0)
TCO2: 40 mmol/L (ref 0–100)
pH, Arterial: 7.581 — ABNORMAL HIGH (ref 7.350–7.450)
pO2, Arterial: 87 mmHg (ref 80.0–100.0)

## 2013-09-09 LAB — BASIC METABOLIC PANEL
BUN: 81 mg/dL — ABNORMAL HIGH (ref 6–23)
CHLORIDE: 92 meq/L — AB (ref 96–112)
CO2: 35 meq/L — AB (ref 19–32)
CREATININE: 2.14 mg/dL — AB (ref 0.50–1.35)
Calcium: 8.1 mg/dL — ABNORMAL LOW (ref 8.4–10.5)
GFR calc Af Amer: 40 mL/min — ABNORMAL LOW (ref >90)
GFR calc non Af Amer: 35 mL/min — ABNORMAL LOW (ref >90)
Glucose, Bld: 109 mg/dL — ABNORMAL HIGH (ref 70–99)
Potassium: 2.8 mEq/L — CL (ref 3.7–5.3)
Sodium: 142 mEq/L (ref 137–147)

## 2013-09-09 LAB — VANCOMYCIN, RANDOM: VANCOMYCIN RM: 18 ug/mL

## 2013-09-09 LAB — GLUCOSE, CAPILLARY
GLUCOSE-CAPILLARY: 104 mg/dL — AB (ref 70–99)
GLUCOSE-CAPILLARY: 107 mg/dL — AB (ref 70–99)
GLUCOSE-CAPILLARY: 108 mg/dL — AB (ref 70–99)
GLUCOSE-CAPILLARY: 116 mg/dL — AB (ref 70–99)
GLUCOSE-CAPILLARY: 96 mg/dL (ref 70–99)
Glucose-Capillary: 90 mg/dL (ref 70–99)

## 2013-09-09 MED ORDER — M.V.I. ADULT IV INJ
INTRAVENOUS | Status: AC
  Administered 2013-09-09: 17:00:00 via INTRAVENOUS
  Filled 2013-09-09: qty 2400

## 2013-09-09 MED ORDER — FAT EMULSION 20 % IV EMUL
240.0000 mL | INTRAVENOUS | Status: AC
  Administered 2013-09-09: 240 mL via INTRAVENOUS
  Filled 2013-09-09: qty 250

## 2013-09-09 MED ORDER — POTASSIUM CHLORIDE 10 MEQ/50ML IV SOLN
INTRAVENOUS | Status: AC
  Filled 2013-09-09: qty 50

## 2013-09-09 MED ORDER — SODIUM CHLORIDE 0.9 % IJ SOLN
10.0000 mL | INTRAMUSCULAR | Status: DC | PRN
  Administered 2013-09-12 – 2013-09-24 (×2): 10 mL
  Administered 2013-09-25 – 2013-09-26 (×4): 20 mL
  Administered 2013-09-27 – 2013-09-28 (×2): 10 mL
  Administered 2013-09-29: 20 mL
  Administered 2013-09-30 – 2013-10-05 (×7): 10 mL
  Administered 2013-10-06: 40 mL
  Administered 2013-10-06 – 2013-10-12 (×6): 10 mL
  Administered 2013-10-13: 20 mL

## 2013-09-09 MED ORDER — POTASSIUM CHLORIDE 10 MEQ/50ML IV SOLN
10.0000 meq | INTRAVENOUS | Status: DC
  Administered 2013-09-09: 10 meq via INTRAVENOUS
  Filled 2013-09-09: qty 50

## 2013-09-09 MED ORDER — SODIUM CHLORIDE 0.9 % IJ SOLN
10.0000 mL | Freq: Two times a day (BID) | INTRAMUSCULAR | Status: DC
  Administered 2013-09-10 – 2013-09-12 (×5): 10 mL
  Administered 2013-09-13: 40 mL
  Administered 2013-09-13: 10 mL
  Administered 2013-09-14: 40 mL
  Administered 2013-09-14 – 2013-09-16 (×4): 10 mL
  Administered 2013-09-17: 20 mL
  Administered 2013-09-17 – 2013-10-14 (×25): 10 mL

## 2013-09-09 MED ORDER — VANCOMYCIN HCL 10 G IV SOLR
1250.0000 mg | INTRAVENOUS | Status: DC
  Administered 2013-09-09: 1250 mg via INTRAVENOUS
  Filled 2013-09-09 (×2): qty 1250

## 2013-09-09 MED ORDER — POTASSIUM CHLORIDE 10 MEQ/50ML IV SOLN
10.0000 meq | Freq: Once | INTRAVENOUS | Status: AC
  Administered 2013-09-09: 10 meq via INTRAVENOUS

## 2013-09-09 MED ORDER — KCL IN DEXTROSE-NACL 40-5-0.45 MEQ/L-%-% IV SOLN
INTRAVENOUS | Status: DC
Start: 2013-09-09 — End: 2013-09-10
  Administered 2013-09-09 (×2): via INTRAVENOUS
  Filled 2013-09-09 (×3): qty 1000

## 2013-09-09 MED ORDER — POTASSIUM CHLORIDE 10 MEQ/100ML IV SOLN
10.0000 meq | INTRAVENOUS | Status: DC
  Administered 2013-09-09 (×4): 10 meq via INTRAVENOUS
  Filled 2013-09-09 (×5): qty 100

## 2013-09-09 NOTE — Progress Notes (Signed)
Name: James Mccormick MRN: [id number] DOB: [birth year]    ADMISSION DATE:  08/28/2013 CONSULTATION DATE:  08/28/13 LOS 12 days  REFERRING MD :  Andrey Campanile PRIMARY SERVICE: CCS  CHIEF COMPLAINT:  Abdominal Pain  BRIEF PATIENT DESCRIPTION:  48 yo male smoker presented with abdominal pain from perforated gastric ulcer in setting of NSAID.  PCCM consulted to assist with septic shock and respiratory failure management.  SIGNIFICANT EVENTS: 1/8: Exploratory laparotomy, debridement of necrotic stomach with scissors, primary repair of perforated stomach in 2 layers.  Septic shock, VDRF, a fib post-op. 1/9: Cardiology consulted . ECHO EF 45% 1/11: still pressor dependent, NSR for > 24 hrs. Amiodarone stopped.  1/12: remain son levophed,. Left radial a line dc'ed 09/02/13: off levophed. Encephalopathic - randomly agitated. Mmit 08/28/2013 . TPN continuesoving around, biting tube, looks randomly, does not track. Failed SBT. Febrile. + 24 Liters. Back on neo 09/03/13: ARDS Protocol with 48h nimbex started. Left radial artery occluded - seen by VVS. Expectant mgmt. 1/15: 50% fio2 on ARDS protocol with nimbex. Left radial artery occlusion events noted. 1/17 off paralytics 1/19 Extubated 1/20 Precedex for agitation  STUDIES:  1/8 CT abd/pelvis >> pneumoperitoneum, Lt kidney stone, diverticulosis 1/9 Echo >> mild LVH, EF 40 to 45%, grade 1 diastolic dysfx 1/14 Lt upper ext arterial duplex >> no evidence for thrombosis in Lt arm 1/16 CT abd/pelvis >> b/l pleural effusions, areas of GGO and consolidation, no leak from stomach or small bowel  LINES / TUBES: 1/8 ETT >> 1/19 1/8 Rt IJ CVL >> 1/8 Lt radial aline >>>1/12 1/14 Rt radial aline>>1/19  CULTURES: Blood 1/9 >> negative Tracheal aspirate 1/16 >> negative  ANTIBIOTICS: Vancomycin 1/8 >>> Zosyn 1/8 >>>>1/14 Micafungin 1/9 >>> Imipenem 1/14 >>>  SUBJECTIVE:  Agitated, hypoxic overnight.  Better with precedex.  IJ line almost out.  VITAL  SIGNS: Temp:  [97.5 F (36.4 C)-98.7 F (37.1 C)] 98.5 F (36.9 C) (01/20 0716) Pulse Rate:  [71-108] 79 (01/20 0500) Resp:  [12-40] 37 (01/20 0500) BP: (111-167)/(64-90) 131/78 mmHg (01/20 0500) SpO2:  [90 %-99 %] 97 % (01/20 0500) Arterial Line BP: (92-182)/(70-96) 170/70 mmHg (01/19 1500) FiO2 (%):  [40 %-50 %] 50 % (01/20 0500) Weight:  [252 lb 6.8 oz (114.5 kg)] 252 lb 6.8 oz (114.5 kg) (01/20 0455) 50% VM  INTAKE / OUTPUT: Intake/Output     01/19 0701 - 01/20 0700 01/20 0701 - 01/21 0700   I.V. (mL/kg) 674.3 (5.9)    NG/GT 50    IV Piggyback 450    TPN 2735    Total Intake(mL/kg) 3909.3 (34.1)    Urine (mL/kg/hr) 11250 (4.1)    Emesis/NG output     Drains     Stool 1 (0)    Total Output 40981     Net -7341.7            PHYSICAL EXAMINATION: General: no distress Neuro: alert, moves extremities, follows simple commands HEENT: no sinus tenderness Cardiovascular: regular Lungs: scattered rhonchi Abdomen: wound dressing clean Musculoskeletal: 1+ edema Skin: no rashes  LABS: PULMONARY  Recent Labs Lab 09/06/13 0346 09/07/13 0350 09/08/13 0414 09/08/13 2232 09/09/13 0536  PHART 7.328* 7.362 7.480* 7.627* 7.581*  PCO2ART 51.7* 51.7* 41.3 33.9* 41.1  PO2ART 79.0* 66.0* 87.0 50.0* 87.0  HCO3 26.8* 29.4* 30.9* 35.4* 38.6*  TCO2 28 31 32 36 40  O2SAT 93.0 92.0 97.0 92.0 98.0   CBC  Recent Labs Lab 09/06/13 0408 09/07/13 0350 09/08/13 0400  HGB 11.2*  10.9* 10.5*  HCT 33.7* 32.1* 31.2*  WBC 12.2* 13.0* 14.0*  PLT 242 266 337   COAGULATION  Recent Labs Lab 09/04/13 0358  INR 1.04   CARDIAC    Recent Labs Lab 09/03/13 0420  TROPONINI <0.30    Recent Labs Lab 09/03/13 0420 09/05/13 0500 09/06/13 0408  PROBNP 358.1* 909.6* 986.2*   CHEMISTRY  Recent Labs Lab 09/04/13 0358 09/05/13 0500 09/06/13 0408  09/07/13 0350 09/08/13 0400 09/09/13 0430  NA 140 139 137  --  137 138 142  K 3.6* 3.6* 4.0  < > 3.8 3.0* 2.8*  CL 101 102 98   --  95* 94* 92*  CO2 25 24 25   --  25 29 35*  GLUCOSE 125* 113* 120*  --  174* 188* 109*  BUN 29* 31* 40*  --  56* 74* 81*  CREATININE 1.38* 1.57* 2.01*  --  2.17* 2.23* 2.14*  CALCIUM 7.6* 7.4* 7.7*  --  7.8* 7.9* 8.1*  MG 1.9 2.0 2.1  --  2.2 2.0  --   PHOS 5.1* 5.3* 5.5*  --  5.6* 4.9* 4.7*  < > = values in this interval not displayed. Estimated Creatinine Clearance: 53.7 ml/min (by C-G formula based on Cr of 2.14).  LIVER  Recent Labs Lab 09/04/13 0358 09/08/13 0400  AST 28 32  ALT 21 19  ALKPHOS 62 134*  BILITOT 1.0 1.0  PROT 4.9* 6.5  ALBUMIN 1.4* 1.5*  INR 1.04  --    INFECTIOUS  Recent Labs Lab 09/03/13 0421 09/03/13 0959 09/04/13 0358 09/05/13 0500  LATICACIDVEN 1.6  --  1.4  --   PROCALCITON  --  24.32 15.05 11.21   ENDOCRINE CBG (last 3)   Recent Labs  09/08/13 1948 09/09/13 09/09/13 0422  GLUCAP 103* 107* 108*   IMAGING x48h  Dg Chest Port 1 View  09/08/2013   CLINICAL DATA:  Intubated, ventilatory support  EXAM: PORTABLE CHEST - 1 VIEW  COMPARISON:  09/07/2013  FINDINGS: Endotracheal tube 5 cm above the carina. Right IJ central line tip at the upper SVC level. NG tube coiled within the stomach. Low lung volumes persist with basilar atelectasis. Right base consolidation again evident, minimally improved. Suspect small right effusion as well. No pneumothorax. Degenerative changes of the spine.  IMPRESSION: Persistent low lung volumes with basilar atelectasis and right base patchy consolidation. Minimal improvement.   Electronically Signed   By: Ruel Favorsrevor  Shick M.D.   On: 09/08/2013 07:46   ASSESSMENT / PLAN:  PULMONARY A:  Acute respiratory failure in setting of peritonitis. Acute lung injury. B/L pleural effusions with passive atelectasis. Hx of smoking >> air trapping on vent 1/10. P: -oxygen to keep SpO2 > 92% -prn BiPAP after extubation  CARDIAC A:  Septic shock 2nd to peritonitis >> resolved.. A fib with RVR >>resolved. Was on IV  amiodarone 1/9 to 1/10. Acute systolic heart failure likely 2nd to sepsis. NSTEMI from demand ischemia. Hx of HTN with chronic diastolic dysfx. P:  -monitor hemodynamics -will need cardiac evaluation and f/u Echo at some point -RT IJ CVL almost out >> will replace with PICC line  RENAL A:   Acute kidney injury 2nd to hypovolemia, and ATN in setting of shock. Metabolic acidosis >> resolved. Hypokalemia. Hypervolemia. P:   -continue lasix gtt for now -f/u renal fx, urine outpt -f/u and replace electrolytes as needed -change IV fluids to D5 1/2 NS at 40 ml/hr with KCL  GASTROINTESTINAL A:   Perforated gastric ulcer  in setting of NSAID use as outpt. Protein calorie malnutrition. P:   -Post -op care per CCS -Protonix BID -hold TNA until PICC line placed  -advance oral feedings as tolerate after extubation per CCS  -speech to assess swallowing  HEMATOLOGIC A:   Anemia of critical illness Low platelet 2nd to sepsis; HIT negative 1/13 >> resolved. P:  -f/u CBC -SQ lovenox for DVT prevention.  INFECTIOUS A:  Septic shock 2nd to peritonitis. Recurrent fever with possible HCAP 1/14. P:   -Day 13 of Abx, day 13 of vancomycin, day 11 of micafungin, day 7 of imipenem  ENDOCRINE A:   Hyperglycemia. Relative adrenal insufficiency in setting of sepsis >> cortisol 14.7 from 1/17. P:   -SSI  -d/c solucortef 1/20  NEUROLOGIC A:   Acute encephalopathy 2nd to septic shock. Agitated delirium after extubation 1/19 >> improved with precedex. P:   -continue precedex -prn fentanyl, ativan  CC time 40 minutes.  Coralyn Helling, MD [facility] Pulmonary/Critical Care 09/09/2013, 7:25 AM Pager:  [phone] After 3pm call: [phone]

## 2013-09-09 NOTE — Evaluation (Signed)
Clinical/Bedside Swallow Evaluation Patient Details  Name: James Mccormick MRN: [id number] Date of Birth: 04/16/1966  Today's Date: 09/09/2013 Time: 1020-1034 SLP Time Calculation (min): 14 min  Past Medical History:  Past Medical History  Diagnosis Date  . GERD (gastroesophageal reflux disease)   . Hypertension    Past Surgical History:  Past Surgical History  Procedure Laterality Date  . Neck surgery    . Laparotomy N/A 08/28/2013    Procedure: EXPLORATORY LAPAROTOMY,debridment of nacrotic stomach,and primary repair of perforated stomach.;  Surgeon: Atilano InaEric M Wilson, MD;  Location: [facility] OR;  Service: General;  Laterality: N/A;   HPI:  48 yo male smoker presented with abdominal pain from perforated gastric ulcer in setting of NSAID.  1/8: Exploratory laparotomy, debridement of necrotic stomach with scissors, primary repair of perforated stomach in 2 layers.  Septic shock, VDRF, a fib post-op.  1/8 ETT >> 1/19.    TPN for nutrition.     Assessment / Plan / Recommendation Clinical Impression  Clinical swallow eval completed.  Pt not yet ready for POs.  High O2 demands, high RR rate (often in mid 30s), and likely compromised laryngeal function s/p lengthy intubation put pt at high aspiration risk with all POs.  Rec continue TPN if unable to tolerate NG feeds.  SLP will f/u next date for improvements, PO readiness.  Discussed with pt, RN and RD.      Aspiration Risk  Severe    Diet Recommendation Alternative means - temporary;NPO           Follow Up Recommendations   (tba)    Frequency and Duration min 3x week  2 weeks   SLP Swallow Goals     Swallow Study Prior Functional Status       General Date of Onset: 08/28/13  Type of Study: Bedside swallow evaluation Diet Prior to this Study: NPO;TNA Temperature Spikes Noted: No Respiratory Status: venti-mask; on 6 liters [place] for swallow study History of Recent Intubation: Yes Length of Intubations (days): 11 days Date  extubated: 09/08/13 Behavior/Cognition: Alert;Confused Oral Cavity - Dentition: Adequate natural dentition Self-Feeding Abilities: Needs assist Patient Positioning: Partially reclined Baseline Vocal Quality: Clear;Low vocal intensity Volitional Cough: Weak Volitional Swallow: Able to elicit    Oral/Motor/Sensory Function Overall Oral Motor/Sensory Function: Appears within functional limits for tasks assessed   Ice Chips Ice chips: Impaired Presentation: Spoon Pharyngeal Phase Impairments: Throat Clearing - Immediate;Suspected delayed Swallow (multiple swallows)   Thin Liquid Thin Liquid: Impaired Presentation: Cup;Spoon Pharyngeal  Phase Impairments: Multiple swallows;Wet Vocal Quality;Throat Clearing - Immediate;Cough - Delayed ; RR increasing to 36   Nectar Thick Nectar Thick Liquid: Not tested   Honey Thick Honey Thick Liquid: Not tested   Puree Puree: Impaired Pharyngeal Phase Impairments: Multiple swallows (up to seven), indicating poor pharyngeal clearance  Solid   James Mccormick, [place] CCC/SLP Pager [phone]     Solid: Not tested       Blenda Mountsouture, James Mccormick 09/09/2013,10:48 AM

## 2013-09-09 NOTE — Progress Notes (Signed)
eLink Physician-Brief Progress Note Patient Name: James LeydenChristopher D Mccormick DOB: 03/08/1966 MRN: [id number]  Date of Service  09/09/2013   HPI/Events of Note  Hypokalemia   eICU Interventions  Potassium replaced   Intervention Category Intermediate Interventions: Electrolyte abnormality - evaluation and management  [name] 09/09/2013, 5:24 AM

## 2013-09-09 NOTE — Progress Notes (Signed)
ANTIBIOTIC CONSULT NOTE - Follow-up  Pharmacy Consult for primaxin, vancomycin, micafungin Indication: empiric lung and abdominal coverage  No Known Allergies  Labs:  Recent Labs  09/07/13 0350 09/08/13 0400 09/09/13 0430  WBC 13.0* 14.0*  --   HGB 10.9* 10.5*  --   PLT 266 337  --   CREATININE 2.17* 2.23* 2.14*   Estimated Creatinine Clearance: 53.7 ml/min (by C-G formula based on Cr of 2.14).  Recent Labs  09/07/13 1230 09/08/13 1230 09/09/13 0500  VANCOTROUGH 33.3*  --   --   VANCORANDOM  --  23.8 18.0     Microbiology: Recent Results (from the past 720 hour(s))  CULTURE, BLOOD (ROUTINE X 2)     Status: None   Collection Time    08/29/13 12:35 AM      Result Value Range Status   Specimen Description BLOOD RIGHT [place]   Final   Special Requests BOTTLES DRAWN AEROBIC ONLY 8CC   Final   Culture  Setup Time     Final   Value: 08/30/2013 05:14     Performed at [facility]   Culture     Final   Value: NO GROWTH 5 DAYS     Performed at [facility]   Report Status 09/05/2013 FINAL   Final  MRSA PCR SCREENING     Status: None   Collection Time    08/29/13  7:55 AM      Result Value Range Status   MRSA by PCR NEGATIVE  NEGATIVE Final   Comment:            The GeneXpert MRSA Assay (FDA     approved for NASAL specimens     only), is one component of a     comprehensive MRSA colonization     surveillance program. It is not     intended to diagnose MRSA     infection nor to guide or     monitor treatment for     MRSA infections.  CULTURE, BLOOD (ROUTINE X 2)     Status: None   Collection Time    08/29/13 11:19 PM      Result Value Range Status   Specimen Description BLOOD RIGHT FOOT   Final   Special Requests BOTTLES DRAWN AEROBIC ONLY 0.5CC   Final   Culture  Setup Time     Final   Value: 08/30/2013 05:13     Performed at [facility]   Culture     Final   Value: NO GROWTH 5 DAYS     Performed at [facility]   Report  Status 09/05/2013 FINAL   Final  CULTURE, RESPIRATORY (NON-EXPECTORATED)     Status: None   Collection Time    09/05/13 10:15 AM      Result Value Range Status   Specimen Description TRACHEAL ASPIRATE   Final   Special Requests Normal   Final   Gram Stain     Final   Value: NO WBC SEEN     NO SQUAMOUS EPITHELIAL CELLS SEEN     NO ORGANISMS SEEN     Performed at [facility]   Culture     Final   Value: NO GROWTH 2 DAYS     Performed at [facility]   Report Status 09/07/2013 FINAL   Final    Medications:  Anti-infectives   Start     Dose/Rate Route Frequency Ordered Stop   09/06/13 1600  imipenem-cilastatin (PRIMAXIN) 500 mg in sodium chloride 0.9 % 100 mL IVPB     500 mg 200 mL/hr over 30 Minutes Intravenous 3 times per day 09/06/13 1310     09/03/13 1100  imipenem-cilastatin (PRIMAXIN) 500 mg in sodium chloride 0.9 % 100 mL IVPB  Status:  Discontinued     500 mg 200 mL/hr over 30 Minutes Intravenous Every 6 hours 09/03/13 1007 09/06/13 1310   09/02/13 1300  vancomycin (VANCOCIN) IVPB 1000 mg/200 mL premix  Status:  Discontinued     1,000 mg 200 mL/hr over 60 Minutes Intravenous Every 12 hours 09/01/13 1527 09/07/13 1340   08/30/13 2000  vancomycin (VANCOCIN) IVPB 750 mg/150 ml premix  Status:  Discontinued     750 mg 150 mL/hr over 60 Minutes Intravenous Every 8 hours 08/30/13 1356 09/01/13 1527   08/29/13 2200  micafungin (MYCAMINE) 100 mg in sodium chloride 0.9 % 100 mL IVPB     100 mg 100 mL/hr over 1 Hours Intravenous Every 24 hours 08/29/13 2036     08/29/13 0400  vancomycin (VANCOCIN) IVPB 1000 mg/200 mL premix  Status:  Discontinued     1,000 mg 200 mL/hr over 60 Minutes Intravenous Every 8 hours 08/28/13 2358 08/30/13 1356   08/29/13 0400  piperacillin-tazobactam (ZOSYN) IVPB 3.375 g  Status:  Discontinued     3.375 g 12.5 mL/hr over 240 Minutes Intravenous 3 times per day 08/28/13 2358 09/03/13 1002   08/28/13 1845  [MAR Hold]  vancomycin  (VANCOCIN) IVPB 1000 mg/200 mL premix     (On MAR Hold since 08/28/13 1957)   1,000 mg 200 mL/hr over 60 Minutes Intravenous  Once 08/28/13 1831 08/28/13 1955   08/28/13 1845  [MAR Hold]  piperacillin-tazobactam (ZOSYN) IVPB 3.375 g     (On MAR Hold since 08/28/13 1957)   3.375 g 100 mL/hr over 30 Minutes Intravenous  Once 08/28/13 1831 08/28/13 2056     Assessment: 47 yom presented to the hospital with abdominal pain. Now s/p debridement of necrotic stomach and repair of stomach perf. He has been on broad-spectrum antibiotics and antifungals since 1/8. His renal function has been fluctuating with a more recent decline.  Vancomycin level this AM = 18  Goal of Therapy:  Eradication of infection Vancomycin trough 15-8320mcg/mL  Plan:  1. Resume vancomycin at 1250 mg iv Q 24 hours 2. Continue primaxin to 500mg  IV Q8H 3. Continue micafungin 100mg  IV Q24H 4. F/u renal fxn, C&S, clinical status and LOT  Thank you. Okey RegalLisa Ivon Roedel, PharmD [phone]  09/09/2013 10:55 AM

## 2013-09-09 NOTE — Progress Notes (Signed)
Peripherally Inserted Central Catheter/Midline Placement  The IV Nurse has discussed with the patient and/or persons authorized to consent for the patient, the purpose of this procedure and the potential benefits and risks involved with this procedure.  The benefits include less needle sticks, lab draws from the catheter and patient may be discharged home with the catheter.  Risks include, but not limited to, infection, bleeding, blood clot (thrombus formation), and puncture of an artery; nerve damage and irregular heat beat.  Alternatives to this procedure were also discussed.  PICC/Midline Placement Documentation  PICC Triple Lumen 09/09/13 PICC Right Basilic 49 cm 4 cm (Active)  Indication for Insertion or Continuance of Line Limited venous access - need for IV therapy >5 days (PICC only) 09/09/2013  2:15 PM  Exposed Catheter (cm) 4 cm 09/09/2013  2:15 PM  Dressing Change Due 09/16/13 09/09/2013  2:15 PM       Poff, Deatra RobinsonJessica Ann 09/09/2013, 2:17 PM

## 2013-09-09 NOTE — Progress Notes (Addendum)
PARENTERAL NUTRITION CONSULT NOTE - FOLLOW UP  Pharmacy Consult for TPN Indication: Prolonged ileus  No Known Allergies  Patient Measurements: Height: 5\' 11"  (180.3 cm) Weight: 252 lb 6.8 oz (114.5 kg) IBW/kg (Calculated) : 75.3 Vital Signs: Temp: 98.5 F (36.9 C) (01/20 0716) Temp src: Oral (01/20 0716) BP: 140/76 mmHg (01/20 0900) Pulse Rate: 79 (01/20 0900) Intake/Output from previous day: 01/19 0701 - 01/20 0700 In: 3930 [I.V.:695; NG/GT:50; IV Piggyback:450; TPN:2735] Out: [id number] [Urine:11250; Stool:1] Intake/Output from this shift: Total I/O In: 81.4 [I.V.:81.4] Out: 575 [Urine:575]  Labs:  Recent Labs  09/07/13 0350 09/08/13 0400  WBC 13.0* 14.0*  HGB 10.9* 10.5*  HCT 32.1* 31.2*  PLT 266 337     Recent Labs  09/07/13 0350 09/08/13 0400 09/09/13 0430  NA 137 138 142  K 3.8 3.0* 2.8*  CL 95* 94* 92*  CO2 25 29 35*  GLUCOSE 174* 188* 109*  BUN 56* 74* 81*  CREATININE 2.17* 2.23* 2.14*  CALCIUM 7.8* 7.9* 8.1*  MG 2.2 2.0  --   PHOS 5.6* 4.9* 4.7*  PROT  --  6.5  --   ALBUMIN  --  1.5*  --   AST  --  32  --   ALT  --  19  --   ALKPHOS  --  134*  --   BILITOT  --  1.0  --   PREALBUMIN  --  9.5*  --   TRIG  --  223*  --    Estimated Creatinine Clearance: 53.7 ml/min (by C-G formula based on Cr of 2.14).    Recent Labs  09/08/13 1948 09/09/13 09/09/13 0422  GLUCAP 103* 107* 108*    Insulin Requirements in the past 24 hours:   20 units insulin total in TPN, no SSI given  Current Nutrition:  TPN off due to central line out, has order for PICC line; off Pivot TF since extubated; new IVF d545ns 40 K at 40 ml/hr  Assessment:  48 y.o. male presented to ED 1/8 with abd pain. CT consistent with perforated stomach ulcer due to NSAID use; pneumoperitoneum; peritonitis. Taken emergently to OR where he underwent exp lap, debridement of necrotic stomach, repair of perforated stomach. Contrast study showed no leak from stomach or small bowel. Currently  off TPN due to central line out.  Has order for PICC line.  Off Pivot TF since he was extubated 1/19.  On precedex drip for agitation.   GI: NPO.  Speech to assess swallowing; prealbumin up to 9.5 from 4.7 Endo: No h/o DM.  Lytes: Na 142 K 2.8, CCM ordered 6 runs K , Phos 4.7, Corr Ca 8.87 CoCa X phos = 42  Lytes added back to TPN yesterday.  MD repleting K this am with 6 runs. Renal: Cr 2.14  UOP 4.1 ml/Kg/hr on lasix drip Hepatobil: LFTs wnl   TG 223 TPN Access: CVC 08/29/13  TPN day#: 12  Nutritional Goals:  Goal 2500 kcal and 150-160gm protein per RD recommendations 09/02/13.   Plan:  1. Continue Clinimix E 5/15 at 100 ml/hr tonight when PICC line placed  Continue lipids at 10 ml/hr.  2.  Keep insulin at 20 units/bag 3. F/u swallow eval by speech Herby AbrahamMichelle T. Izzabell Klasen, Pharm.D. [phone] 09/09/2013 9:43 AM

## 2013-09-09 NOTE — Progress Notes (Signed)
Pt becoming more restless/anxious since beginning of shift (7pm). Pt attempting to climb out of bed, states he "cant relax" "can't get comfortable" pt dose complain of pain at times. 100 mcg IV Fent given Q2H. RR increasing from 20s-40s. O2 Sats decreased from 93-94% on 4L  to 88-90%. CCS MD Cornet called regarding pt agitation. 1mg  Ativan ordered but told to call CCM for further orders. CCM Elink MD Molli KnockYacoub contacted after 1mg  Ativan given with no relief. 4mg  IV haldol given with little relief. ABG ordered, pt increased to 50% Venti mask, MD Molli KnockYacoub aware of ABG results. O2 Sats now 94-96%. Pt still anxious/restless. RR 30s. Elink MD Deterding contacted, orders to start precedex received. Will initiate and continue to monitor.

## 2013-09-09 NOTE — Progress Notes (Signed)
12 Days Post-Op  Subjective: Extubated yesterday Confused and combative but will answer questions  Objective: Vital signs in last 24 hours: Temp:  [97.5 F (36.4 C)-98.7 F (37.1 C)] 98.5 F (36.9 C) (01/20 0716) Pulse Rate:  [71-108] 79 (01/20 0500) Resp:  [12-40] 37 (01/20 0500) BP: (111-167)/(64-90) 131/78 mmHg (01/20 0500) SpO2:  [90 %-99 %] 97 % (01/20 0500) Arterial Line BP: (92-182)/(70-96) 170/70 mmHg (01/19 1500) FiO2 (%):  [40 %-50 %] 50 % (01/20 0500) Weight:  [252 lb 6.8 oz (114.5 kg)] 252 lb 6.8 oz (114.5 kg) (01/20 0455) Last BM Date: 09/08/13  Intake/Output from previous day: 01/19 0701 - 01/20 0700 In: 3909.3 [I.V.:674.3; NG/GT:50; IV Piggyback:450; TPN:2735] Out: [id number] [Urine:11250; Stool:1] Intake/Output this shift:    Distended, wound with fat necrosis.  Sutures are a little loose and trying to dehis Drains seropurluent  Lab Results:   Recent Labs  09/07/13 0350 09/08/13 0400  WBC 13.0* 14.0*  HGB 10.9* 10.5*  HCT 32.1* 31.2*  PLT 266 337   BMET  Recent Labs  09/08/13 0400 09/09/13 0430  NA 138 142  K 3.0* 2.8*  CL 94* 92*  CO2 29 35*  GLUCOSE 188* 109*  BUN 74* 81*  CREATININE 2.23* 2.14*  CALCIUM 7.9* 8.1*   PT/INR No results found for this basename: LABPROT, INR,  in the last 72 hours ABG  Recent Labs  09/08/13 2232 09/09/13 0536  PHART 7.627* 7.581*  HCO3 35.4* 38.6*    Studies/Results: Dg Chest Port 1 View  09/08/2013   CLINICAL DATA:  Intubated, ventilatory support  EXAM: PORTABLE CHEST - 1 VIEW  COMPARISON:  09/07/2013  FINDINGS: Endotracheal tube 5 cm above the carina. Right IJ central line tip at the upper SVC level. NG tube coiled within the stomach. Low lung volumes persist with basilar atelectasis. Right base consolidation again evident, minimally improved. Suspect small right effusion as well. No pneumothorax. Degenerative changes of the spine.  IMPRESSION: Persistent low lung volumes with basilar atelectasis and  right base patchy consolidation. Minimal improvement.   Electronically Signed   By: Ruel Favorsrevor  Shick M.D.   On: 09/08/2013 07:46    Anti-infectives: Anti-infectives   Start     Dose/Rate Route Frequency Ordered Stop   09/06/13 1600  imipenem-cilastatin (PRIMAXIN) 500 mg in sodium chloride 0.9 % 100 mL IVPB     500 mg 200 mL/hr over 30 Minutes Intravenous 3 times per day 09/06/13 1310     09/03/13 1100  imipenem-cilastatin (PRIMAXIN) 500 mg in sodium chloride 0.9 % 100 mL IVPB  Status:  Discontinued     500 mg 200 mL/hr over 30 Minutes Intravenous Every 6 hours 09/03/13 1007 09/06/13 1310   09/02/13 1300  vancomycin (VANCOCIN) IVPB 1000 mg/200 mL premix  Status:  Discontinued     1,000 mg 200 mL/hr over 60 Minutes Intravenous Every 12 hours 09/01/13 1527 09/07/13 1340   08/30/13 2000  vancomycin (VANCOCIN) IVPB 750 mg/150 ml premix  Status:  Discontinued     750 mg 150 mL/hr over 60 Minutes Intravenous Every 8 hours 08/30/13 1356 09/01/13 1527   08/29/13 2200  micafungin (MYCAMINE) 100 mg in sodium chloride 0.9 % 100 mL IVPB     100 mg 100 mL/hr over 1 Hours Intravenous Every 24 hours 08/29/13 2036     08/29/13 0400  vancomycin (VANCOCIN) IVPB 1000 mg/200 mL premix  Status:  Discontinued     1,000 mg 200 mL/hr over 60 Minutes Intravenous Every 8 hours 08/28/13 2358  08/30/13 1356   08/29/13 0400  piperacillin-tazobactam (ZOSYN) IVPB 3.375 g  Status:  Discontinued     3.375 g 12.5 mL/hr over 240 Minutes Intravenous 3 times per day 08/28/13 2358 09/03/13 1002   08/28/13 1845  [MAR Hold]  vancomycin (VANCOCIN) IVPB 1000 mg/200 mL premix     (On MAR Hold since 08/28/13 1957)   1,000 mg 200 mL/hr over 60 Minutes Intravenous  Once 08/28/13 1831 08/28/13 1955   08/28/13 1845  [MAR Hold]  piperacillin-tazobactam (ZOSYN) IVPB 3.375 g     (On MAR Hold since 08/28/13 1957)   3.375 g 100 mL/hr over 30 Minutes Intravenous  Once 08/28/13 1831 08/28/13 2056      Assessment/Plan: s/p  Procedure(s): EXPLORATORY LAPAROTOMY,debridment of nacrotic stomach,and primary repair of perforated stomach. (N/A)  Remains critically ill Will try some po Will place wound vac Continue current care  LOS: 12 days    Adalaya Irion A 09/09/2013

## 2013-09-09 NOTE — Progress Notes (Signed)
eLink Physician-Brief Progress Note Patient Name: James LeydenChristopher D Mccormick DOB: 12/24/1965 MRN: [id number]  Date of Service  09/09/2013   HPI/Events of Note  Patient extubated today.  This PM patient has been tenuous primarily due to agitation/delirium.  RR in the 40s with stable BP.  Nurse is concerned related to resp status.  Does not feel patient will tolerate BiPAP.  ABG is 7.62/33/50/35.    Received 4 mg haldol IV with limited response.  eICU Interventions  Plan: Start precedex for agitation Consider additional haldol Consider BiPAP if patient can tolerate B Veleta Minersllis, NP to evaluate patient at bedside.   Intervention Category Major Interventions: Delirium, psychosis, severe agitation - evaluation and management  DETERDING,[name] 09/09/2013, 12:00 AM

## 2013-09-10 ENCOUNTER — Inpatient Hospital Stay (HOSPITAL_COMMUNITY): Payer: Medicaid Other

## 2013-09-10 ENCOUNTER — Encounter (HOSPITAL_COMMUNITY): Payer: Self-pay | Admitting: Radiology

## 2013-09-10 LAB — CBC
HCT: 32.2 % — ABNORMAL LOW (ref 39.0–52.0)
HEMOGLOBIN: 10.8 g/dL — AB (ref 13.0–17.0)
MCH: 29.9 pg (ref 26.0–34.0)
MCHC: 33.5 g/dL (ref 30.0–36.0)
MCV: 89.2 fL (ref 78.0–100.0)
Platelets: 438 10*3/uL — ABNORMAL HIGH (ref 150–400)
RBC: 3.61 MIL/uL — ABNORMAL LOW (ref 4.22–5.81)
RDW: 14.4 % (ref 11.5–15.5)
WBC: 20.2 10*3/uL — ABNORMAL HIGH (ref 4.0–10.5)

## 2013-09-10 LAB — GLUCOSE, CAPILLARY
Glucose-Capillary: 115 mg/dL — ABNORMAL HIGH (ref 70–99)
Glucose-Capillary: 126 mg/dL — ABNORMAL HIGH (ref 70–99)
Glucose-Capillary: 124 mg/dL — ABNORMAL HIGH (ref 70–99)
Glucose-Capillary: 146 mg/dL — ABNORMAL HIGH (ref 70–99)

## 2013-09-10 LAB — BASIC METABOLIC PANEL
BUN: 76 mg/dL — AB (ref 6–23)
CO2: 32 mEq/L (ref 19–32)
CREATININE: 1.97 mg/dL — AB (ref 0.50–1.35)
Calcium: 8 mg/dL — ABNORMAL LOW (ref 8.4–10.5)
Chloride: 98 mEq/L (ref 96–112)
GFR calc Af Amer: 44 mL/min — ABNORMAL LOW (ref >90)
GFR, EST NON AFRICAN AMERICAN: 38 mL/min — AB (ref >90)
GLUCOSE: 135 mg/dL — AB (ref 70–99)
Potassium: 3 mEq/L — ABNORMAL LOW (ref 3.7–5.3)
Sodium: 145 mEq/L (ref 137–147)

## 2013-09-10 LAB — MAGNESIUM: Magnesium: 2.6 mg/dL — ABNORMAL HIGH (ref 1.5–2.5)

## 2013-09-10 LAB — CLOSTRIDIUM DIFFICILE BY PCR: Toxigenic C. Difficile by PCR: POSITIVE — AB

## 2013-09-10 MED ORDER — IOHEXOL 300 MG/ML  SOLN
100.0000 mL | Freq: Once | INTRAMUSCULAR | Status: AC | PRN
  Administered 2013-09-10: 100 mL via INTRAVENOUS

## 2013-09-10 MED ORDER — METRONIDAZOLE IN NACL 5-0.79 MG/ML-% IV SOLN
500.0000 mg | Freq: Three times a day (TID) | INTRAVENOUS | Status: DC
  Administered 2013-09-10: 500 mg via INTRAVENOUS
  Filled 2013-09-10 (×3): qty 100

## 2013-09-10 MED ORDER — POTASSIUM CHLORIDE 10 MEQ/50ML IV SOLN
10.0000 meq | INTRAVENOUS | Status: AC
  Administered 2013-09-10 (×5): 10 meq via INTRAVENOUS
  Filled 2013-09-10: qty 50

## 2013-09-10 MED ORDER — VANCOMYCIN 50 MG/ML ORAL SOLUTION
500.0000 mg | Freq: Four times a day (QID) | ORAL | Status: DC
  Administered 2013-09-10 – 2013-09-20 (×39): 500 mg via ORAL
  Filled 2013-09-10 (×47): qty 10

## 2013-09-10 MED ORDER — IOHEXOL 300 MG/ML  SOLN
25.0000 mL | INTRAMUSCULAR | Status: AC
  Administered 2013-09-10 (×2): 25 mL via ORAL

## 2013-09-10 MED ORDER — HYDRALAZINE HCL 20 MG/ML IJ SOLN
10.0000 mg | INTRAMUSCULAR | Status: DC | PRN
  Administered 2013-09-10 – 2013-09-20 (×12): 10 mg via INTRAVENOUS
  Filled 2013-09-10 (×13): qty 1

## 2013-09-10 MED ORDER — FAT EMULSION 20 % IV EMUL
240.0000 mL | INTRAVENOUS | Status: AC
  Administered 2013-09-10: 240 mL via INTRAVENOUS
  Filled 2013-09-10: qty 250

## 2013-09-10 MED ORDER — ONDANSETRON HCL 4 MG/2ML IJ SOLN
INTRAMUSCULAR | Status: AC
  Administered 2013-09-10: 4 mg via INTRAVENOUS
  Filled 2013-09-10: qty 2

## 2013-09-10 MED ORDER — ONDANSETRON HCL 4 MG/2ML IJ SOLN
4.0000 mg | Freq: Four times a day (QID) | INTRAMUSCULAR | Status: DC | PRN
  Administered 2013-09-10 – 2013-10-11 (×8): 4 mg via INTRAVENOUS
  Filled 2013-09-10 (×7): qty 2

## 2013-09-10 MED ORDER — METRONIDAZOLE IN NACL 5-0.79 MG/ML-% IV SOLN
500.0000 mg | Freq: Three times a day (TID) | INTRAVENOUS | Status: DC
  Administered 2013-09-10 – 2013-09-22 (×35): 500 mg via INTRAVENOUS
  Filled 2013-09-10 (×39): qty 100

## 2013-09-10 MED ORDER — TRACE MINERALS CR-CU-F-FE-I-MN-MO-SE-ZN IV SOLN
INTRAVENOUS | Status: AC
  Administered 2013-09-10: 18:00:00 via INTRAVENOUS
  Filled 2013-09-10: qty 3000

## 2013-09-10 MED ORDER — VANCOMYCIN 50 MG/ML ORAL SOLUTION
500.0000 mg | Freq: Four times a day (QID) | ORAL | Status: DC
  Filled 2013-09-10 (×3): qty 10

## 2013-09-10 NOTE — Consult Note (Signed)
WOC wound consult note Reason for Consult: placement of NPWT VAC dressing to midline surgical wound  Wound type: surgical wound Measurement: 19cm x 5cm x 3.5cm  Wound bed: early granulation tissue, however 25 % yellow, soft slough in the base with visible sutures Drainage (amount, consistency, odor) moderate, serosanguinous  Periwound: intact, 2 JP drains in place left and right lateral of the midline wound Dressing procedure/placement/frequency: 1pc of black granufoam placed in the wound bed, drape applied, seal at 125mmHG. Pt tolerated well, did not need pain meds with dressing. Ok for bedside nursing staff to maintain and change VAC moving forward.  Discussed POC with patient and bedside nurse.  Re consult if needed, will not follow at this time. Thanks  Yareth Kearse [employer] RN, CWOCN [phone])

## 2013-09-10 NOTE — Progress Notes (Addendum)
PARENTERAL NUTRITION CONSULT NOTE - FOLLOW UP  Pharmacy Consult for TPN Indication: Prolonged ileus  No Known Allergies  Patient Measurements: Height: _0  (180.3 cm) Weight: 233 lb 14.5 oz (106.1 kg) IBW/kg (Calculated) : 75.3 Vital Signs: Temp: 98.8 F (37.1 C) (01/21 0727) Temp src: Oral (01/21 0727) BP: 163/81 mmHg (01/21 0800) Pulse Rate: 78 (01/21 0800) Intake/Output from previous day: 01/20 0701 - 01/21 0700 In: 3864.1 [I.V.:1227.3; IV Piggyback:1150; TPN:1486.8] Out: 5600 [Urine:5600] Intake/Output from this shift: Total I/O In: 182.6 [I.V.:32.6; IV Piggyback:150] Out: 1000 [Urine:1000]  Labs:  Recent Labs  09/08/13 0400 09/10/13 0435  WBC 14.0* 20.2*  HGB 10.5* 10.8*  HCT 31.2* 32.2*  PLT 337 438*     Recent Labs  09/08/13 0400 09/09/13 0430 09/10/13 0435  NA 138 142 145  K 3.0* 2.8* 3.0*  CL 94* 92* 98  CO2 29 35* 32  GLUCOSE 188* 109* 135*  BUN 74* 81* 76*  CREATININE 2.23* 2.14* 1.97*  CALCIUM 7.9* 8.1* 8.0*  MG 2.0  --  2.6*  PHOS 4.9* 4.7*  --   PROT 6.5  --   --   ALBUMIN 1.5*  --   --   AST 32  --   --   ALT 19  --   --   ALKPHOS 134*  --   --   BILITOT 1.0  --   --   PREALBUMIN 9.5*  --   --   TRIG 223*  --   --    Estimated Creatinine Clearance: 56.2 ml/min (by C-G formula based on Cr of 1.97).    Recent Labs  09/09/13 1952 09/09/13 2345 09/10/13 0724  GLUCAP 115* 116* 124*    Insulin Requirements in the past 24 hours:   20 units insulin total in TPN + 4 units from SSI  Current Nutrition:  Clinimix E 5/15 at 135m/hr. Provides 120g protein and 2352kcal per day  Nutritional Goals:  Goal 2500 kcal and 150-160gm protein per RD recommendations 09/02/13.   Assessment:  48y.o. male presented to ED 1/8 with abd pain. CT consistent with perforated stomach ulcer due to NSAID use; pneumoperitoneum; peritonitis. Taken emergently to OR where he underwent exp lap, debridement of necrotic stomach, repair of perforated stomach.  Contrast study showed no leak from stomach or small bowel. Off Pivot TF since he was extubated 1/19.  On precedex drip for agitation.   GI: remains NPO after assessment by speech yesterday; with loose stools and elevated WBC- sending CDif  Endo: No h/o DM. CBGs have been elevated on TPN- insulin requirements as above  Lytes: Na 145, K 3 (in the process of receiving 6 runs per CCM orders), Phos 4.7, Mag 2.6, CorCa 8.87 (CoCa X phos = 42).   Renal: Cr 1.97, UOP 2.2 ml/kg/hr- Lasix drip has been stopped  Hepatobil: LFTs wnl with exception of elevated Alk Phos. Prealbumin 9.5 (up from previous value of 4.7), trigs 223  TPN Access: CVC 08/29/13  TPN day#: 13  Plan:  1. Continue Clinimix E 5/15 at 1044mhr with 20 units regular insulin in bag + multivitamins and full trace elements. 2. Continue lipid emulsion 20% at 10 ml/hr.  3. Follow for ability to extubate and start TF or PO intake 4. TPN labs as ordered 5. Continue SSI and CBGs as ordered  Yazmina Pareja D. Nikalas Bramel, PharmD, BCPS Clinical Pharmacist Pager: [phone]/21/2015 10:26 AM

## 2013-09-10 NOTE — Progress Notes (Signed)
eLink Physician-Brief Progress Note Patient Name: Pamalee LeydenChristopher D Orrego DOB: 02/19/1966 MRN: [id number]  Date of Service  09/10/2013   HPI/Events of Note   C. Diff positive diarrhea.  eICU Interventions  Rectal tube ordered.      YACOUB,WESAM 09/10/2013, 3:24 PM

## 2013-09-10 NOTE — Progress Notes (Signed)
Patient ID: James Mccormick, James Mccormick   DOB: 12/14/1965, 48 y.o.   MRN: [id number]  Pt currently is awake and alert.  He reports minimal abdominal pain and his abdomen is fairly soft and minimally tender  CT was reviewed.  I suspect that the small amount of free air is coming from his small fascial dehiscence. There is no extravasation of contrast from the stomach on CT.  He is Positive for C.diff which I suspect is the cause of his increased WBC.  Will follow his abdominal exam closely

## 2013-09-10 NOTE — Significant Event (Signed)
C diff PCR positive.  Will add IV flagyl, po vancomycin.  Sputum cx negative.  Will d/c IV vancomycin, and micafungin.  Continue primaxin per CCS for abdominal process.  Coralyn HellingVineet Tre Sanker, MD [facility] 09/10/2013, 2:36 PM Pager:  [phone] After 3pm call: [phone]'

## 2013-09-10 NOTE — Progress Notes (Signed)
Name: James Mccormick MRN: [id number] DOB: [birth year]    ADMISSION DATE:  08/28/2013 CONSULTATION DATE:  08/28/13 LOS 13 days  REFERRING MD :  Andrey Campanile PRIMARY SERVICE: [facility]  CHIEF COMPLAINT:  Abdominal Pain  BRIEF PATIENT DESCRIPTION:  48 yo male smoker presented with abdominal pain from perforated gastric ulcer in setting of NSAID.  PCCM consulted to assist with septic shock and respiratory failure management.  SIGNIFICANT EVENTS: 1/8: Exploratory laparotomy, debridement of necrotic stomach with scissors, primary repair of perforated stomach in 2 layers.  Septic shock, VDRF, a fib post-op. 1/9: Cardiology consulted . ECHO EF 45% 1/11: still pressor dependent, NSR for > 24 hrs. Amiodarone stopped.  1/12: remain son levophed,. Left radial a line dc'ed 09/02/13: off levophed. Encephalopathic - randomly agitated. Mmit 08/28/2013 . TPN continuesoving around, biting tube, looks randomly, does not track. Failed SBT. Febrile. + 24 Liters. Back on neo 09/03/13: ARDS Protocol with 48h nimbex started. Left radial artery occluded - seen by [facility]. Expectant mgmt. 1/15: 50% fio2 on ARDS protocol with nimbex. Left radial artery occlusion events noted. 1/17 off paralytics 1/19 Extubated 1/20 Precedex for agitation  STUDIES:  1/8 CT abd/pelvis >> pneumoperitoneum, Lt kidney stone, diverticulosis 1/9 Echo >> mild LVH, EF 40 to 45%, grade 1 diastolic dysfx 1/14 Lt upper ext arterial duplex >> no evidence for thrombosis in Lt arm 1/16 CT abd/pelvis >> b/l pleural effusions, areas of GGO and consolidation, no leak from stomach or small bowel 1/20 Speech therapy swallow eval; d/c solu cortef 1/21 Diarrhea, increased WBC  LINES / TUBES: 1/8 ETT >> 1/19 1/8 Rt IJ CVL >> 1/20 1/8 Lt radial aline >>>1/12 1/14 Rt radial aline>>1/19 1/20 Rt PICC >>  CULTURES: Blood 1/9 >> negative Tracheal aspirate 1/16 >> negative C diff 1/21 >>  ANTIBIOTICS: Vancomycin 1/8 >>> Zosyn 1/8 >>>>1/14 Micafungin 1/9  >>> Imipenem 1/14 >>>  SUBJECTIVE:  Remains on precedex.  More alert.  C/o abd discomfort and soreness with deep breaths.  Weak voice.  Denies chest pain.  VITAL SIGNS: Temp:  [98.3 F (36.8 C)-99.8 F (37.7 C)] 98.8 F (37.1 C) (01/21 0727) Pulse Rate:  [78-92] 78 (01/21 0800) Resp:  [24-37] 29 (01/21 0800) BP: (136-165)/(73-98) 163/81 mmHg (01/21 0800) SpO2:  [93 %-97 %] 97 % (01/21 0800) FiO2 (%):  [50 %] 50 % (01/20 1200) Weight:  [233 lb 14.5 oz (106.1 kg)] 233 lb 14.5 oz (106.1 kg) (01/21 0357) 6 liters  INTAKE / OUTPUT: Intake/Output     01/20 0701 - 01/21 0700 01/21 0701 - 01/22 0700   I.V. (mL/kg) 1227.3 (11.6) 17.8 (0.2)   NG/GT     IV Piggyback 1150 50   TPN 1486.8    Total Intake(mL/kg) 3864.1 (36.4) 67.8 (0.6)   Urine (mL/kg/hr) 5600 (2.2) 400 (3.1)   Stool     Total Output 5600 400   Net -1735.9 -332.3        Stool Occurrence 2 x      PHYSICAL EXAMINATION: General: no distress Neuro: alert, moves extremities, follows simple commands, RASS 0 HEENT: no sinus tenderness Cardiovascular: regular Lungs: scattered rhonchi Abdomen: wound dressing clean Musculoskeletal: ankle edema Skin: no rashes  LABS: PULMONARY  Recent Labs Lab 09/06/13 0346 09/07/13 0350 09/08/13 0414 09/08/13 2232 09/09/13 0536  PHART 7.328* 7.362 7.480* 7.627* 7.581*  PCO2ART 51.7* 51.7* 41.3 33.9* 41.1  PO2ART 79.0* 66.0* 87.0 50.0* 87.0  HCO3 26.8* 29.4* 30.9* 35.4* 38.6*  TCO2 28 31 32 36 40  O2SAT 93.0  92.0 97.0 92.0 98.0   CBC  Recent Labs Lab 09/07/13 0350 09/08/13 0400 09/10/13 0435  HGB 10.9* 10.5* 10.8*  HCT 32.1* 31.2* 32.2*  WBC 13.0* 14.0* 20.2*  PLT 266 337 438*   COAGULATION  Recent Labs Lab 09/04/13 0358  INR 1.04   CARDIAC   No results found for this basename: TROPONINI,  in the last 168 hours  Recent Labs Lab 09/05/13 0500 09/06/13 0408  PROBNP 909.6* 986.2*   CHEMISTRY  Recent Labs Lab 09/05/13 0500 09/06/13 0408   09/07/13 0350 09/08/13 0400 09/09/13 0430 09/10/13 0435  NA 139 137  --  137 138 142 145  K 3.6* 4.0  < > 3.8 3.0* 2.8* 3.0*  CL 102 98  --  95* 94* 92* 98  CO2 24 25  --  25 29 35* 32  GLUCOSE 113* 120*  --  174* 188* 109* 135*  BUN 31* 40*  --  56* 74* 81* 76*  CREATININE 1.57* 2.01*  --  2.17* 2.23* 2.14* 1.97*  CALCIUM 7.4* 7.7*  --  7.8* 7.9* 8.1* 8.0*  MG 2.0 2.1  --  2.2 2.0  --  2.6*  PHOS 5.3* 5.5*  --  5.6* 4.9* 4.7*  --   < > = values in this interval not displayed. Estimated Creatinine Clearance: 56.2 ml/min (by C-G formula based on Cr of 1.97).  LIVER  Recent Labs Lab 09/04/13 0358 09/08/13 0400  AST 28 32  ALT 21 19  ALKPHOS 62 134*  BILITOT 1.0 1.0  PROT 4.9* 6.5  ALBUMIN 1.4* 1.5*  INR 1.04  --    INFECTIOUS  Recent Labs Lab 09/03/13 0959 09/04/13 0358 09/05/13 0500  LATICACIDVEN  --  1.4  --   PROCALCITON 24.32 15.05 11.21   ENDOCRINE CBG (last 3)   Recent Labs  09/09/13 1952 09/09/13 2345 09/10/13 0724  GLUCAP 115* 116* 124*   IMAGING x48h  Dg Chest Port 1 View  09/10/2013   CLINICAL DATA:  Respiratory failure  EXAM: PORTABLE CHEST - 1 VIEW  COMPARISON:  Portable chest x-ray dated 09 September 2013.  FINDINGS: The lung volumes are low. The interstitial markings of both lungs remain increased and are slightly more conspicuous today. The right hemidiaphragm remains obscured. There is blunting of the left lateral costophrenic angle. The cardiopericardial silhouette is enlarged. The pulmonary vascularity is engorged and indistinct.  The PICC line tip lies in the region of the junction of the SVC with the right atrium.  IMPRESSION: 1. Confluent density at the right lung base is consistent with atelectasis or pneumonia. Small to moderate-sized bilateral pleural effusions are suspected. 2. The appearance of the cardiac silhouette and pulmonary interstitium is worrisome for CHF. Overall there may have been slight interval deterioration in the appearance  of the chest since yesterday's study.   Electronically Signed   By: David  [place]   On: 09/10/2013 07:37   Dg Chest Port 1 View  09/09/2013   CLINICAL DATA:  Atelectasis.  Pleural effusion .  EXAM: PORTABLE CHEST - 1 VIEW  COMPARISON:  09/08/2013.  FINDINGS: Interim extubation. Right IJ line in stable position. Cardiomegaly. Developing bilateral pulmonary alveolar infiltrates are noted. Small left pleural effusion noted. Congestive heart failure with pulmonary edema could present in this fashion. Bilateral pneumonia could present in this fashion. Bibasilar atelectasis. No pneumothorax. Degenerative changes thoracic spine.  IMPRESSION: 1. Interim extubation.  Right IJ line in stable position. 2. Developing bilateral pulmonary alveolar infiltrates and left pleural effusion.  This along with cardiomegaly suggests congestive heart failure with pulmonary edema. Bilateral pneumonia could present in this fashion. 3. Bibasilar atelectasis.   Electronically Signed   By: Maisie Fushomas  Register   On: 09/09/2013 07:39   ASSESSMENT / PLAN:  PULMONARY A:  Acute respiratory failure with ALI in setting of peritonitis. B/L pleural effusions with passive atelectasis. Hx of smoking >> air trapping on vent 1/10. P: -oxygen to keep SpO2 > 92% -f/u CXR  -may need therapeutic thoracentesis if unable to wean off oxygen further  CARDIAC A:  Septic shock 2nd to peritonitis >> resolved. A fib with RVR >>resolved. Was on IV amiodarone 1/9 to 1/10. Acute systolic heart failure likely 2nd to sepsis. NSTEMI from demand ischemia. Hx of HTN with chronic diastolic dysfx. P:  -add prn hydralazine 1/21 -will need cardiac evaluation and f/u Echo at some point  RENAL A:   Acute kidney injury 2nd to hypovolemia, and ATN in setting of shock >> improving. Metabolic acidosis >> resolved. Hypokalemia. Hypervolemia >> improving. P:   -f/u renal fx, urine outpt -f/u and replace electrolytes as needed -KVO IV  fluids  GASTROINTESTINAL A:   Perforated gastric ulcer in setting of NSAID use as outpt. Protein calorie malnutrition. Dypshagia. P:   -Post -op care per [facility] -Protonix BID -TNA per [facility] -advance oral feedings as tolerate after extubation per [facility] once he passes swallow evaluation  HEMATOLOGIC A:   Anemia of critical illness Low platelet 2nd to sepsis; HIT negative 1/13 >> resolved. P:  -f/u CBC -SQ lovenox for DVT prevention.  INFECTIOUS A:  Septic shock 2nd to peritonitis. Recurrent fever with possible HCAP 1/14. Diarrhea, increased WBC 1/21. P:   -Day 14 of Abx, day 14 of vancomycin, day 12 of micafungin, day 8 of imipenem -f/u CT abd/pelvis 1/21, and stool C diff  ENDOCRINE A:   Hyperglycemia. Relative adrenal insufficiency in setting of sepsis >> cortisol 14.7 from 1/17  >> resolved. P:   -SSI   NEUROLOGIC A:   Acute encephalopathy 2nd to septic shock >> improved. Agitated delirium after extubation 1/19 >> improved with precedex. Deconditioning. P:   -continue precedex -prn fentanyl, ativan -PT evaluation  Coralyn HellingVineet Keyleen Cerrato, MD [facility] 09/10/2013, 8:12 AM Pager:  [phone] After 3pm call: [phone]

## 2013-09-10 NOTE — Progress Notes (Addendum)
Speech Language Pathology Treatment: Dysphagia  Patient Details Name: James Mccormick MRN: [id number] DOB: 12/20/1965 Today's Date: 09/10/2013 Time: 1012-1030 SLP Time Calculation (min): 18 min  Assessment / Plan / Recommendation Clinical Impression  SLP called by RN to f/u for PO trials, pt needs to drink Omnipaque for CT abdomen. Pt observed to be fully alert on [place] at 6 Liters RR at 28 at baseline, O2 at 99. Pt with decreased breath support for speech, but vocal quality clear with cues to phonate. With initial sip, pt with overt cough, sensing penetrate. Further trials very successful. Pt took 10 sips of water with timely strong swallow, no signs of aspiration, stable respiratory function. SLP assisted pt in taking 75% of Omnipaque via small straw sips with no signs of aspiration. Pt is recommended to drink contrast with full supervision at a slow careful pace. Pt may have ice chips and sips of liquids if approved by MD as long as RR remains stable, below 30 with good oxygenation, no distress. Discussed with RN. Will return tomorrow for trials with solids if approved by MD.    HPI HPI: 48 yo male smoker presented with abdominal pain from perforated gastric ulcer in setting of NSAID.  1/8: Exploratory laparotomy, debridement of necrotic stomach with scissors, primary repair of perforated stomach in 2 layers.  Septic shock, VDRF, a fib post-op.  1/8 ETT >> 1/19.    TPN for nutrition.     Pertinent Vitals NA  SLP Plan  Continue with current plan of care    Recommendations Diet recommendations: NPO (NPO pending recs from [facility], may have contrast)              General recommendations: Rehab consult Oral Care Recommendations: Oral care Q4 per protocol Follow up Recommendations: Skilled Nursing facility Plan: Continue with current plan of care    GO    Kate Dishman Rehabilitation HospitalBonnie Emilie Carp, MA CCC-SLP [phone]  Claudine MoutonDeBlois, Tonjua Rossetti Caroline 09/10/2013, 10:36 AM

## 2013-09-10 NOTE — Progress Notes (Signed)
Wasted 50ml Precedex, 50ml fent, and 10ml versed, all from bag. Rinsed down the sink. Witnessed by second RN Herbert DeanerPuja Patel.

## 2013-09-10 NOTE — Progress Notes (Signed)
13 Days Post-Op  Subjective: More alert and less confused.  Minimal abdominal pain.  Had loose BM's.  Cdiff ordered  Objective: Vital signs in last 24 hours: Temp:  [98.3 F (36.8 C)-99.8 F (37.7 C)] 98.8 F (37.1 C) (01/21 0727) Pulse Rate:  [78-92] 82 (01/21 0700) Resp:  [24-37] 34 (01/21 0700) BP: (136-165)/(73-98) 157/98 mmHg (01/21 0700) SpO2:  [93 %-97 %] 96 % (01/21 0700) FiO2 (%):  [50 %] 50 % (01/20 1200) Weight:  [233 lb 14.5 oz (106.1 kg)] 233 lb 14.5 oz (106.1 kg) (01/21 0357) Last BM Date: 09/08/13  Intake/Output from previous day: 01/20 0701 - 01/21 0700 In: 3822.7 [I.V.:1185.9; IV Piggyback:1150; TPN:1486.8] Out: 5600 [Urine:5600] Intake/Output this shift:    More alert Abdomen soft with minimal tenderness, drains with purulence  Lab Results:   Recent Labs  09/08/13 0400 09/10/13 0435  WBC 14.0* 20.2*  HGB 10.5* 10.8*  HCT 31.2* 32.2*  PLT 337 438*   BMET  Recent Labs  09/09/13 0430 09/10/13 0435  NA 142 145  K 2.8* 3.0*  CL 92* 98  CO2 35* 32  GLUCOSE 109* 135*  BUN 81* 76*  CREATININE 2.14* 1.97*  CALCIUM 8.1* 8.0*   PT/INR No results found for this basename: LABPROT, INR,  in the last 72 hours ABG  Recent Labs  09/08/13 2232 09/09/13 0536  PHART 7.627* 7.581*  HCO3 35.4* 38.6*    Studies/Results: Dg Chest Port 1 View  09/10/2013   CLINICAL DATA:  Respiratory failure  EXAM: PORTABLE CHEST - 1 VIEW  COMPARISON:  Portable chest x-ray dated 09 September 2013.  FINDINGS: The lung volumes are low. The interstitial markings of both lungs remain increased and are slightly more conspicuous today. The right hemidiaphragm remains obscured. There is blunting of the left lateral costophrenic angle. The cardiopericardial silhouette is enlarged. The pulmonary vascularity is engorged and indistinct.  The PICC line tip lies in the region of the junction of the SVC with the right atrium.  IMPRESSION: 1. Confluent density at the right lung base is  consistent with atelectasis or pneumonia. Small to moderate-sized bilateral pleural effusions are suspected. 2. The appearance of the cardiac silhouette and pulmonary interstitium is worrisome for CHF. Overall there may have been slight interval deterioration in the appearance of the chest since yesterday's study.   Electronically Signed   By: David  [place]   On: 09/10/2013 07:37   Dg Chest Port 1 View  09/09/2013   CLINICAL DATA:  Atelectasis.  Pleural effusion .  EXAM: PORTABLE CHEST - 1 VIEW  COMPARISON:  09/08/2013.  FINDINGS: Interim extubation. Right IJ line in stable position. Cardiomegaly. Developing bilateral pulmonary alveolar infiltrates are noted. Small left pleural effusion noted. Congestive heart failure with pulmonary edema could present in this fashion. Bilateral pneumonia could present in this fashion. Bibasilar atelectasis. No pneumothorax. Degenerative changes thoracic spine.  IMPRESSION: 1. Interim extubation.  Right IJ line in stable position. 2. Developing bilateral pulmonary alveolar infiltrates and left pleural effusion. This along with cardiomegaly suggests congestive heart failure with pulmonary edema. Bilateral pneumonia could present in this fashion. 3. Bibasilar atelectasis.   Electronically Signed   By: Maisie Fushomas  Register   On: 09/09/2013 07:39    Anti-infectives: Anti-infectives   Start     Dose/Rate Route Frequency Ordered Stop   09/09/13 1602  vancomycin (VANCOCIN) 1,250 mg in sodium chloride 0.9 % 250 mL IVPB     1,250 mg 166.7 mL/hr over 90 Minutes Intravenous Every 24 hours 09/09/13  1057     09/06/13 1600  imipenem-cilastatin (PRIMAXIN) 500 mg in sodium chloride 0.9 % 100 mL IVPB     500 mg 200 mL/hr over 30 Minutes Intravenous 3 times per day 09/06/13 1310     09/03/13 1100  imipenem-cilastatin (PRIMAXIN) 500 mg in sodium chloride 0.9 % 100 mL IVPB  Status:  Discontinued     500 mg 200 mL/hr over 30 Minutes Intravenous Every 6 hours 09/03/13 1007 09/06/13 1310    09/02/13 1300  vancomycin (VANCOCIN) IVPB 1000 mg/200 mL premix  Status:  Discontinued     1,000 mg 200 mL/hr over 60 Minutes Intravenous Every 12 hours 09/01/13 1527 09/07/13 1340   08/30/13 2000  vancomycin (VANCOCIN) IVPB 750 mg/150 ml premix  Status:  Discontinued     750 mg 150 mL/hr over 60 Minutes Intravenous Every 8 hours 08/30/13 1356 09/01/13 1527   08/29/13 2200  micafungin (MYCAMINE) 100 mg in sodium chloride 0.9 % 100 mL IVPB     100 mg 100 mL/hr over 1 Hours Intravenous Every 24 hours 08/29/13 2036     08/29/13 0400  vancomycin (VANCOCIN) IVPB 1000 mg/200 mL premix  Status:  Discontinued     1,000 mg 200 mL/hr over 60 Minutes Intravenous Every 8 hours 08/28/13 2358 08/30/13 1356   08/29/13 0400  piperacillin-tazobactam (ZOSYN) IVPB 3.375 g  Status:  Discontinued     3.375 g 12.5 mL/hr over 240 Minutes Intravenous 3 times per day 08/28/13 2358 09/03/13 1002   08/28/13 1845  [MAR Hold]  vancomycin (VANCOCIN) IVPB 1000 mg/200 mL premix     (On MAR Hold since 08/28/13 1957)   1,000 mg 200 mL/hr over 60 Minutes Intravenous  Once 08/28/13 1831 08/28/13 1955   08/28/13 1845  [MAR Hold]  piperacillin-tazobactam (ZOSYN) IVPB 3.375 g     (On MAR Hold since 08/28/13 1957)   3.375 g 100 mL/hr over 30 Minutes Intravenous  Once 08/28/13 1831 08/28/13 2056      Assessment/Plan: s/p Procedure(s): EXPLORATORY LAPAROTOMY,debridment of nacrotic stomach,and primary repair of perforated stomach. (N/A)  Given increasing WBC, will need to repeat a CT of the abdomen and pelvis today with contrast to r/o intra-abdominal abscess. Continuing TNA and antibiotics. PT consult Speech re-eval  LOS: 13 days    Shanavia Makela A 09/10/2013

## 2013-09-10 NOTE — Progress Notes (Signed)
eLink Physician-Brief Progress Note Patient Name: James LeydenChristopher D Violet DOB: 08/09/1966 MRN: [id number]  Date of Service  09/10/2013   HPI/Events of Note  Hypokalemia   eICU Interventions  Potassium replaced   Intervention Category Intermediate Interventions: Electrolyte abnormality - evaluation and management  Teddy Rebstock 09/10/2013, 5:34 AM

## 2013-09-11 DIAGNOSIS — J90 Pleural effusion, not elsewhere classified: Secondary | ICD-10-CM | POA: Diagnosis not present

## 2013-09-11 DIAGNOSIS — A0472 Enterocolitis due to Clostridium difficile, not specified as recurrent: Secondary | ICD-10-CM | POA: Diagnosis not present

## 2013-09-11 LAB — COMPREHENSIVE METABOLIC PANEL
ALBUMIN: 2 g/dL — AB (ref 3.5–5.2)
ALT: 25 U/L (ref 0–53)
AST: 33 U/L (ref 0–37)
Alkaline Phosphatase: 178 U/L — ABNORMAL HIGH (ref 39–117)
BUN: 63 mg/dL — AB (ref 6–23)
CALCIUM: 8 mg/dL — AB (ref 8.4–10.5)
CO2: 29 mEq/L (ref 19–32)
Chloride: 103 mEq/L (ref 96–112)
Creatinine, Ser: 1.92 mg/dL — ABNORMAL HIGH (ref 0.50–1.35)
GFR calc non Af Amer: 39 mL/min — ABNORMAL LOW (ref >90)
GFR, EST AFRICAN AMERICAN: 46 mL/min — AB (ref >90)
GLUCOSE: 152 mg/dL — AB (ref 70–99)
Potassium: 3.4 mEq/L — ABNORMAL LOW (ref 3.7–5.3)
SODIUM: 148 meq/L — AB (ref 137–147)
Total Bilirubin: 0.8 mg/dL (ref 0.3–1.2)
Total Protein: 7 g/dL (ref 6.0–8.3)

## 2013-09-11 LAB — GLUCOSE, CAPILLARY
GLUCOSE-CAPILLARY: 123 mg/dL — AB (ref 70–99)
GLUCOSE-CAPILLARY: 141 mg/dL — AB (ref 70–99)
GLUCOSE-CAPILLARY: 126 mg/dL — AB (ref 70–99)
GLUCOSE-CAPILLARY: 141 mg/dL — AB (ref 70–99)
Glucose-Capillary: 134 mg/dL — ABNORMAL HIGH (ref 70–99)
Glucose-Capillary: 141 mg/dL — ABNORMAL HIGH (ref 70–99)
Glucose-Capillary: 135 mg/dL — ABNORMAL HIGH (ref 70–99)

## 2013-09-11 LAB — MAGNESIUM: Magnesium: 2.6 mg/dL — ABNORMAL HIGH (ref 1.5–2.5)

## 2013-09-11 LAB — PHOSPHORUS: Phosphorus: 4.6 mg/dL (ref 2.3–4.6)

## 2013-09-11 MED ORDER — FAT EMULSION 20 % IV EMUL
240.0000 mL | INTRAVENOUS | Status: AC
  Administered 2013-09-11: 240 mL via INTRAVENOUS
  Filled 2013-09-11: qty 250

## 2013-09-11 MED ORDER — LABETALOL HCL 5 MG/ML IV SOLN
10.0000 mg | INTRAVENOUS | Status: DC | PRN
  Administered 2013-09-11 – 2013-09-17 (×10): 10 mg via INTRAVENOUS
  Filled 2013-09-11 (×10): qty 4

## 2013-09-11 MED ORDER — METOPROLOL TARTRATE 1 MG/ML IV SOLN
2.5000 mg | INTRAVENOUS | Status: DC | PRN
  Administered 2013-09-11 – 2013-09-20 (×14): 5 mg via INTRAVENOUS
  Filled 2013-09-11 (×14): qty 5

## 2013-09-11 MED ORDER — DEXMEDETOMIDINE HCL IN NACL 400 MCG/100ML IV SOLN
0.0000 ug/kg/h | INTRAVENOUS | Status: DC
  Administered 2013-09-11: 0.7 ug/kg/h via INTRAVENOUS
  Administered 2013-09-11 (×2): 0.6 ug/kg/h via INTRAVENOUS
  Administered 2013-09-11: 0.5 ug/kg/h via INTRAVENOUS
  Administered 2013-09-11: 0.3 ug/kg/h via INTRAVENOUS
  Administered 2013-09-12: 0.7 ug/kg/h via INTRAVENOUS
  Administered 2013-09-12: 0.5 ug/kg/h via INTRAVENOUS
  Filled 2013-09-11: qty 100
  Filled 2013-09-11 (×2): qty 50
  Filled 2013-09-11: qty 100
  Filled 2013-09-11: qty 50
  Filled 2013-09-11 (×2): qty 100
  Filled 2013-09-11: qty 50

## 2013-09-11 MED ORDER — TRACE MINERALS CR-CU-F-FE-I-MN-MO-SE-ZN IV SOLN
INTRAVENOUS | Status: AC
  Administered 2013-09-11: 18:00:00 via INTRAVENOUS
  Filled 2013-09-11: qty 3000

## 2013-09-11 MED ORDER — FUROSEMIDE 10 MG/ML IJ SOLN
40.0000 mg | Freq: Once | INTRAMUSCULAR | Status: AC
  Administered 2013-09-11: 40 mg via INTRAVENOUS
  Filled 2013-09-11: qty 4

## 2013-09-11 MED ORDER — POTASSIUM CHLORIDE 10 MEQ/50ML IV SOLN
10.0000 meq | INTRAVENOUS | Status: AC
  Administered 2013-09-11 (×2): 10 meq via INTRAVENOUS
  Filled 2013-09-11 (×2): qty 50

## 2013-09-11 NOTE — Consult Note (Signed)
Called by the bedside nurses for alarming NPWT device.  Found that [facility] appeared to be occluded with drainage.  When I removed the TRAC pad it did have large amount of thick drainage in the tubing and entrance to the [facility].  I replaced the distal portion of the black foam as well. The top of the dressing was not sealed off or did not appear to have suction. This did not resolve the alarm issue/blockage alarm.  I proceeded to change the entire foam dressing and placed a layer of Mepitel in the base of the wound due to the drainage and the slough in the wound base.  Will monitor the NPWT device tom to see if the dressing is still functional. Cleaned the tubing out from the distal portion by suctioning saline through the tubing.  Seal obtained with new dressing at 125mmHG.  Pt tolerated well. Will monitor NPWT dressing.  Ysabelle Goodroe [place] RN, [place] [phone]

## 2013-09-11 NOTE — Progress Notes (Signed)
Pharmacy Consult - Primaxin  Day # 9 of Primaxin Day #14 total of antibiotics (off Vancomycin, Micafungin) Continues for empiric abdominal coverage Scr stable  Now C Diff positive - IV Flagyl, po vancomycin to be added  Plan: 1) Continue Primaxin 500 mg iv Q 8 hours 2) Continue to follow  Thank you. Okey RegalLisa Zanyia Silbaugh, PharmD [phone]

## 2013-09-11 NOTE — Progress Notes (Signed)
eLink Physician-Brief Progress Note Patient Name: Pamalee LeydenChristopher D Figge DOB: 03/13/1966 MRN: [id number]  Date of Service  09/11/2013   HPI/Events of Note     eICU Interventions  Hypokalemia -repleted    Intervention Category Intermediate Interventions: Electrolyte abnormality - evaluation and management  Emmylou Bieker V. 09/11/2013, 4:59 AM

## 2013-09-11 NOTE — Progress Notes (Signed)
eLink Physician-Brief Progress Note Patient Name: Pamalee LeydenChristopher D Wolfinger DOB: 02/12/1966 MRN: [id number]  Date of Service  09/11/2013   HPI/Events of Note     eICU Interventions  Lopressor prn Precedex order 'expired' - no agitation, OK to keep off for now   Intervention Category Intermediate Interventions: Hypertension - evaluation and management  ALVA,RAKESH V. 09/11/2013, 1:16 AM

## 2013-09-11 NOTE — Progress Notes (Signed)
[facility]     Name: James Mccormick MRN: [id number] DOB: [birth year]    ADMISSION DATE:  08/28/2013 CONSULTATION DATE:  08/28/13 LOS 14 days  REFERRING MD :  Andrey Campanile PRIMARY SERVICE: [facility]  CHIEF COMPLAINT:  Abdominal Pain  BRIEF PATIENT DESCRIPTION:  48 yo male smoker presented with abdominal pain from perforated gastric ulcer in setting of NSAID.  PCCM consulted to assist with septic shock and respiratory failure management.  SIGNIFICANT EVENTS: 1/8: Exploratory laparotomy, debridement of necrotic stomach with scissors, primary repair of perforated stomach in 2 layers.  Septic shock, VDRF, a fib post-op. 1/9: Cardiology consulted . ECHO EF 45% 1/11: still pressor dependent, NSR for > 24 hrs. Amiodarone stopped.  1/12: remain son levophed,. Left radial a line dc'ed 09/02/13: off levophed. Encephalopathic - randomly agitated. Mmit 08/28/2013 . TPN continuesoving around, biting tube, looks randomly, does not track. Failed SBT. Febrile. + 24 Liters. Back on neo 09/03/13: ARDS Protocol with 48h nimbex started. Left radial artery occluded - seen by VVS. Expectant mgmt. 1/15: 50% fio2 on ARDS protocol with nimbex. Left radial artery occlusion events noted. 1/17 off paralytics 1/19 Extubated 1/20 Precedex for agitation 1/20 Speech therapy swallow eval; d/c solu cortef 1/21 Diarrhea, increased WBC 1/22 Trial on BiPAP  STUDIES:  1/8 CT abd/pelvis >> pneumoperitoneum, Lt kidney stone, diverticulosis 1/9 Echo >> mild LVH, EF 40 to 45%, grade 1 diastolic dysfx 1/14 Lt upper ext arterial duplex >> no evidence for thrombosis in Lt arm 1/16 CT abd/pelvis >> b/l pleural effusions, areas of GGO and consolidation, no leak from stomach or small bowel  LINES / TUBES: 1/8 ETT >> 1/19 1/8 Rt IJ CVL >> 1/20 1/8 Lt radial aline >>>1/12 1/14 Rt radial aline>>1/19 1/20 Rt PICC >>  CULTURES: Blood 1/9 >> negative Tracheal aspirate 1/16 >> negative C diff 1/21 >>POS   ANTIBIOTICS: Vancomycin 1/8  >>>1/20 Zosyn 1/8 >>>>1/14 Micafungin 1/9 >>>1/21 Imipenem 1/14 >>> Flagyl 1/21>>> Vancomycin (oral) 1/21>>>  SUBJECTIVE:  Remains on precedex.  More alert. Tachypneic this am, mild hypoxia.   VITAL SIGNS: Temp:  [98 F (36.7 C)-99.3 F (37.4 C)] 98 F (36.7 C) (01/22 0758) Pulse Rate:  [80-111] 110 (01/22 0930) Resp:  [21-39] 30 (01/22 0930) BP: (137-197)/(68-109) 182/99 mmHg (01/22 0930) SpO2:  [88 %-97 %] 95 % (01/22 0930) Weight:  [230 lb 9.6 oz (104.6 kg)] 230 lb 9.6 oz (104.6 kg) (01/22 0500) 6 liters  INTAKE / OUTPUT: Intake/Output     01/21 0701 - 01/22 0700 01/22 0701 - 01/23 0700   I.V. (mL/kg) 402.6 (3.8) 40 (0.4)   IV Piggyback 700 50   TPN 1420 330   Total Intake(mL/kg) 2522.6 (24.1) 420 (4)   Urine (mL/kg/hr) 6235 (2.5) 350 (1)   Drains 55 (0)    Total Output 6290 350   Net -3767.5 +70        Stool Occurrence 2 x      PHYSICAL EXAMINATION: General: increased WOB Neuro: alert, mildly confused, follows simple commands HEENT: no sinus tenderness Cardiovascular: regular, tachycardic Lungs: scattered rhonchi Abdomen: wound dressing clean Musculoskeletal: ankle edema Skin: no rashes  LABS: PULMONARY  Recent Labs Lab 09/06/13 0346 09/07/13 0350 09/08/13 0414 09/08/13 2232 09/09/13 0536  PHART 7.328* 7.362 7.480* 7.627* 7.581*  PCO2ART 51.7* 51.7* 41.3 33.9* 41.1  PO2ART 79.0* 66.0* 87.0 50.0* 87.0  HCO3 26.8* 29.4* 30.9* 35.4* 38.6*  TCO2 28 31 32 36 40  O2SAT 93.0 92.0 97.0 92.0 98.0   CBC  Recent Labs  Lab 09/07/13 0350 09/08/13 0400 09/10/13 0435  HGB 10.9* 10.5* 10.8*  HCT 32.1* 31.2* 32.2*  WBC 13.0* 14.0* 20.2*  PLT 266 337 438*   CARDIAC   No results found for this basename: TROPONINI,  in the last 168 hours  Recent Labs Lab 09/05/13 0500 09/06/13 0408  PROBNP 909.6* 986.2*   CHEMISTRY  Recent Labs Lab 09/06/13 0408  09/07/13 0350 09/08/13 0400 09/09/13 0430 09/10/13 0435 09/11/13 0407  NA 137  --  137 138  142 145 148*  K 4.0  < > 3.8 3.0* 2.8* 3.0* 3.4*  CL 98  --  95* 94* 92* 98 103  CO2 25  --  25 29 35* 32 29  GLUCOSE 120*  --  174* 188* 109* 135* 152*  BUN 40*  --  56* 74* 81* 76* 63*  CREATININE 2.01*  --  2.17* 2.23* 2.14* 1.97* 1.92*  CALCIUM 7.7*  --  7.8* 7.9* 8.1* 8.0* 8.0*  MG 2.1  --  2.2 2.0  --  2.6* 2.6*  PHOS 5.5*  --  5.6* 4.9* 4.7*  --  4.6  < > = values in this interval not displayed. Estimated Creatinine Clearance: 57.3 ml/min (by C-G formula based on Cr of 1.92).  LIVER  Recent Labs Lab 09/08/13 0400 09/11/13 0407  AST 32 33  ALT 19 25  ALKPHOS 134* 178*  BILITOT 1.0 0.8  PROT 6.5 7.0  ALBUMIN 1.5* 2.0*   INFECTIOUS  Recent Labs Lab 09/05/13 0500  PROCALCITON 11.21   ENDOCRINE CBG (last 3)   Recent Labs  09/10/13 2002 09/10/13 2351 09/11/13 0402  GLUCAP 123* 134* 141*   IMAGING x48h  Ct Abdomen Pelvis W Contrast  09/10/2013   CLINICAL DATA:  Increasing white blood cell count postop  EXAM: CT ABDOMEN AND PELVIS WITH CONTRAST  TECHNIQUE: Multidetector CT imaging of the abdomen and pelvis was performed using the standard protocol following bolus administration of intravenous contrast.  CONTRAST:  OMNIPAQUE IOHEXOL 300 MG/ML  SOLN  COMPARISON:  09/05/2013  FINDINGS: Small item lateral left pleural effusions. There is associated dependent lower lobe atelectasis, with milder subsegmental atelectasis at the base of the right middle lobe and left upper lobe lingula. Lung base parenchymal opacity has mildly improved from the prior study.  There is a small amount of ascites. In ill-defined focal collection of fluid lies in the left upper quadrant adjacent to the spleen measuring 4.4 cm x 4.1 cm in greatest transverse dimensions. This is similar to the prior exam. Bubbles of intraperitoneal air are scattered throughout the mesenteric. This is new. There is a focus of air along the lesser curvature of the proximal stomach. This could potentially be a small  defect in the wall. However, there is no extraluminal contrast. The mesentery shows ill-defined areas of increased attenuation consistent with edema without change from the prior study. Two drainage catheters lie in the central upper abdomen, also stable.  Liver is unremarkable. Spleen is normal in size. There are irregular hypo attenuating areas in the spleen are likely from differential enhancement. No splenic mass is seen. Normal gallbladder. The pancreas is unremarkable. No bile duct dilation no adrenal masses. Small nonobstructing stone in the lower pole of the left kidney. No renal masses. No hydronephrosis. Symmetric enhancement and excretion. The bladder is unremarkable, partly decompressed by a Foley catheter  There is no bowel distention to suggest obstruction or significant adynamic ileus. No bowel wall thickening.  No osteoblastic or osteolytic lesions.  IMPRESSION: 1. Bilateral pleural effusions similar to the prior exam, but less lung base parenchymal opacity. Lung base opacity in the current exam is most consistent with atelectasis. 2. Small amount of ascites without significant change. 3. Focal fluid collection along the gastrosplenic ligament, also without significant change. This could potentially be an infected fluid collection. No other focal areas of fluid collection. 4. Diffuse ill-defined increased attenuation throughout the mesenteric and along the omentum. There is associated extraluminal bubbles of air scattered within the central upper mesenteric. This air was not present on the prior exam, and suggests a small leak from a viscus. The air may potentially have been introduced from the drainage catheters, however. A possible wall defect lies along the proximal stomach lesser curvature. There is no extraluminal contrast, however.   Electronically Signed   By: Amie Portland M.D.   On: 09/10/2013 14:25   Dg Chest Port 1 View  09/10/2013   CLINICAL DATA:  Respiratory failure  EXAM: PORTABLE  CHEST - 1 VIEW  COMPARISON:  Portable chest x-ray dated 09 September 2013.  FINDINGS: The lung volumes are low. The interstitial markings of both lungs remain increased and are slightly more conspicuous today. The right hemidiaphragm remains obscured. There is blunting of the left lateral costophrenic angle. The cardiopericardial silhouette is enlarged. The pulmonary vascularity is engorged and indistinct.  The PICC line tip lies in the region of the junction of the SVC with the right atrium.  IMPRESSION: 1. Confluent density at the right lung base is consistent with atelectasis or pneumonia. Small to moderate-sized bilateral pleural effusions are suspected. 2. The appearance of the cardiac silhouette and pulmonary interstitium is worrisome for CHF. Overall there may have been slight interval deterioration in the appearance of the chest since yesterday's study.   Electronically Signed   By: David  [place]   On: 09/10/2013 07:37   ASSESSMENT / PLAN:  PULMONARY A:  Acute respiratory failure with ALI in setting of peritonitis. B/L pleural effusions with passive atelectasis. Hx of smoking >> air trapping on vent 1/10. P: -oxygen to keep SpO2 > 92% -f/u CXR  -proceed with thoracentesis 1/22 -BiPAP prn  CARDIAC A:  Septic shock 2nd to peritonitis >> resolved. A fib with RVR >>resolved. Was on IV amiodarone 1/9 to 1/10. Acute systolic heart failure likely 2nd to sepsis. NSTEMI from demand ischemia. Hx of HTN with chronic diastolic dysfx. P:  -prn hydralazine, labetalol 1/21 -add scheduled oral meds once able to swallow pills -will need cardiac evaluation and f/u Echo at some point  RENAL A:   Acute kidney injury 2nd to hypovolemia, and ATN in setting of shock >> improving. Metabolic acidosis >> resolved. Hypokalemia. Hypervolemia >> improving. P:   -f/u renal fx, urine outpt -f/u and replace electrolytes as needed -KVO IV fluids -lasix 40 mg IV x one on 1/22  GASTROINTESTINAL A:    Perforated gastric ulcer in setting of NSAID use as outpt. Protein calorie malnutrition. Dypshagia. P:   -Post -op care per CCS -Protonix BID -TNA per [facility]CS -advance oral feedings as tolerate after extubation per [facility]CS once he passes swallow evaluation  HEMATOLOGIC A:   Anemia of critical illness Low platelet 2nd to sepsis; HIT negative 1/13 >> resolved. P:  -f/u CBC -SQ lovenox for DVT prevention.  INFECTIOUS A:  Septic shock 2nd to peritonitis. Recurrent fever with possible HCAP 1/14. Diarrhea, increased WBC 1/21 2nd to C diff. P:   -Day 14 of Abx, day 9 of imipenem -Day 2 of  IV flagyl >> add po vancomycin when able to swallow pills  ENDOCRINE A:   Hyperglycemia. Relative adrenal insufficiency in setting of sepsis >> cortisol 14.7 from 1/17  >> resolved. P:   -SSI   NEUROLOGIC A:   Acute encephalopathy 2nd to septic shock >> improved. Agitated delirium after extubation 1/19 >> improved with precedex. Deconditioning. P:   -continue precedex -prn fentanyl, ativan -PT evaluation   WHITEHEART,KATHRYN, NP 09/11/2013  10:21 AM Pager: [phone] or [phone]  Reviewed above, examined pt, and agree with assessment/plan.  C diff positive.  Still has increased volume and BP remains elevated >> will give additional lasix.  If able to swallow will add oral anti-HTN meds.  Will check bedside u/s and then decide about thoracentesis.  BiPAP prn.   Updated family at bedside.  CC time 35 minutes.  Coralyn HellingVineet Trumaine Wimer, MD [facility] 09/11/2013, 10:34 AM Pager:  [phone] After 3pm call: [phone]

## 2013-09-11 NOTE — Evaluation (Signed)
Physical Therapy Evaluation Patient Details Name: James LeydenChristopher D Mccormick MRN: [id number] DOB: 03/23/1966 Today's Date: 09/11/2013 Time: [id number] PT Time Calculation (min): 47 min  PT Assessment / Plan / Recommendation History of Present Illness  48 yo male smoker presented with abdominal pain from perforated gastric ulcer in setting of NSAID.  PCCM consulted to assist with septic shock and respiratory failure management  Clinical Impression  Pt admitted for above problem which has led to an extended period of immobility. Pt currently with functional limitations due to the deficits listed below (see PT Problem List).  Pt will benefit from skilled PT to increase their independence and safety with mobility to allow discharge to the venue listed below.       PT Assessment  Patient needs continued PT services    Follow Up Recommendations  CIR    Does the patient have the potential to tolerate intense rehabilitation      Barriers to Discharge        Equipment Recommendations  Other (comment) (TBA)    Recommendations for Other Services Rehab consult   Frequency Min 3X/week    Precautions / Restrictions Precautions Precautions: Fall Precaution Comments: tachy   Pertinent Vitals/Pain Sats on 3.5L at 94-95% generally throughout, but HR in the 110's at rest and EHR max of 150 with limited gait and consistently in 120's to 130's bpm with dyspneia.      Mobility  Bed Mobility Overal bed mobility: Needs Assistance Bed Mobility: Sidelying to Sit Sidelying to sit: Mod assist General bed mobility comments: mod assist to bridge; truncal assist Transfers Overall transfer level: Needs assistance Transfers: Sit to/from Stand;Stand Pivot Transfers Sit to Stand: Min assist;Mod assist;+2 safety/equipment Stand pivot transfers: Min assist;Mod assist;+2 safety/equipment General transfer comment: assist to come forward Ambulation/Gait Ambulation/Gait assistance: Min assist Ambulation Distance  (Feet): 5 Feet (forward and back, limited by tachy and fatigue) Assistive device:  (pushed a w/c) Gait Pattern/deviations: Step-to pattern    Exercises     PT Diagnosis: Difficulty walking;Generalized weakness  PT Problem List: Decreased strength;Decreased activity tolerance;Decreased balance;Decreased mobility;Decreased knowledge of use of DME;Cardiopulmonary status limiting activity PT Treatment Interventions: DME instruction;Gait training;Stair training;Functional mobility training;Therapeutic activities;Balance training;Patient/family education     PT Goals(Current goals can be found in the care plan section) Acute Rehab PT Goals Patient Stated Goal: Need to get back to work PT Goal Formulation: With patient Time For Goal Achievement: 09/25/13 Potential to Achieve Goals: Good  Visit Information  Last PT Received On: 09/11/13 Assistance Needed: +2 History of Present Illness: 48 yo male smoker presented with abdominal pain from perforated gastric ulcer in setting of NSAID.  PCCM consulted to assist with septic shock and respiratory failure management       Prior Functioning  Home Living Family/patient expects to be discharged to:: Private residence Living Arrangements: Other (Comment) (daughter lives with him) Available Help at Discharge: Family (daughter can assist and per pt is the only one able to help) Type of Home: Mobile home Home Access: Stairs to enter Secretary/administratorntrance Stairs-Number of Steps: 2 Entrance Stairs-Rails: Right;Left Home Layout: One level Home Equipment: None Prior Function Level of Independence: Independent Communication Communication: No difficulties    Cognition  Cognition Arousal/Alertness: Awake/alert Behavior During Therapy: WFL for tasks assessed/performed Overall Cognitive Status: Within Functional Limits for tasks assessed    Extremity/Trunk Assessment Upper Extremity Assessment Upper Extremity Assessment: Defer to OT evaluation Lower Extremity  Assessment Lower Extremity Assessment: Generalized weakness Cervical / Trunk Assessment Cervical / Trunk Assessment: Normal  Balance Balance Overall balance assessment: Needs assistance Sitting-balance support: No upper extremity supported;Feet supported Sitting balance-Leahy Scale: Good Standing balance support: Bilateral upper extremity supported Standing balance-Leahy Scale: Good  End of Session PT - End of Session Equipment Utilized During Treatment: Oxygen Activity Tolerance: Patient tolerated treatment well Patient left: in chair;with call bell/phone within reach;with nursing/sitter in room Nurse Communication: Mobility status  GP     Lauralynn Loeb, Eliseo Gum 09/11/2013, 5:01 PM 09/11/2013  [place] Bing, PT [phone]  (pager)

## 2013-09-11 NOTE — Progress Notes (Signed)
Speech Language Pathology Treatment: Dysphagia  Patient Details Name: James LeydenChristopher D Mccormick MRN: [id number] DOB: 12/27/1965 Today's Date: 09/11/2013 Time: [id number] SLP Time Calculation (min): 8 min  Assessment / Plan / Recommendation Clinical Impression  SLP asked to see pt to check readiness to advance PO and attempt oral meds. Pt with increased RR, fluctuating between 30 and 40. Pt observed to have immediate inspiration following swallow due to shortness of breath. High risk of aspiration of thin liquids at this time due to respiratory instability. Pt may have ice chips after oral care and necessary meds in puree, but should remain NPO until respiratory status significantly improved. Discussed with RN and PA. SLP will recheck tomorrow for improvement.    HPI HPI: 48 yo male smoker presented with abdominal pain from perforated gastric ulcer in setting of NSAID.  1/8: Exploratory laparotomy, debridement of necrotic stomach with scissors, primary repair of perforated stomach in 2 layers.  Septic shock, VDRF, a fib post-op.  1/8 ETT >> 1/19.    TPN for nutrition.     Pertinent Vitals NA  SLP Plan  Continue with current plan of care    Recommendations Diet recommendations: NPO (meds in puree, ice chips) Medication Administration: Whole meds with puree Supervision: Staff to assist with self feeding Postural Changes and/or Swallow Maneuvers: Seated upright 90 degrees              Oral Care Recommendations: Oral care Q4 per protocol Plan: Continue with current plan of care    GO    [facility] LPBonnie Myrene Bougher, MA CCC-SLP [phone]  Claudine MoutonDeBlois, Lynel Forester Caroline 09/11/2013, 10:57 AM

## 2013-09-11 NOTE — Progress Notes (Signed)
Pt never restrained, order received from MD while pt agitated, family to bedside, pt more calm, will d/c restraint order. Koren BoundWaggoner, Neylan Koroma

## 2013-09-11 NOTE — Progress Notes (Addendum)
NUTRITION FOLLOW UP  Intervention:    TPN per pharmacy  Advance diet as medically appropriate  RD to follow for nutrition care plan  Nutrition Dx:   Inadequate oral intake related to inability to eat, s/p extubation as evidenced by NPO status, ongoing  Goal:   Pt to meet >/= 90% of their estimated nutrition needs, progressing  Monitor:   TPN prescription, PO diet advancement, weight, labs, I/O's  Assessment:   Patient with PMH of GERD, HTN presented with severe abdominal pain associated with nausea & vomiting; had been taking Alleve and large amount of Goody's powder.   CT scan revealed perforated stomach ulcer due to NSAID use.   Patient s/p procedures 1/8:  EXPLORATORY LAPAROTOMY  DEBRIDEMENT OF NECROTIC STOMACH  REPAIR OF PERFORATED STOMACH  S/p bedside swallow evaluation 1/20.  SLP recommending continued NPO status.   CWOCN [facility] midline surgical wound.  Patient tested positive for C difficile 1/21.  IV Flagyl & PO Vancomycin added.  Patient receiving TPN with Clinimix E 5/15 @ 100 ml/hr and lipids @ 10 ml/hr. Provides 2184 kcal, 120 gm protein daily. Meets 99% minimum estimated energy needs and 80% minimum estimated protein needs.  Height: 08/28/13   5\' 11"  (1.803 m)    Weight Status ---> trending down, + diuresis Wt Readings from Last 1 Encounters:  09/11/13 230 lb 9.6 oz (104.6 kg)    01/21 233 lb 01/20 252 lb 01/19 261 lb 01/18 276 lb 01/17 278 lb 01/16 279 lb 01/12 262 lb  Admission weight: 225 lb  Re-estimated needs:  Kcal: 2250-2450 Protein: 150-160 gm Fluid: per MD  Skin: abdominal surgical incision   Diet Order: NPO   Intake/Output Summary (Last 24 hours) at 09/11/13 1037 Last data filed at 09/11/13 1000  Gross per 24 hour  Intake   2760 ml  Output   5290 ml  Net  -2530 ml    Labs:   Recent Labs Lab 09/08/13 0400 09/09/13 0430 09/10/13 0435 09/11/13 0407  NA 138 142 145 148*  K 3.0* 2.8* 3.0*  3.4*  CL 94* 92* 98 103  CO2 29 35* 32 29  BUN 74* 81* 76* 63*  CREATININE 2.23* 2.14* 1.97* 1.92*  CALCIUM 7.9* 8.1* 8.0* 8.0*  MG 2.0  --  2.6* 2.6*  PHOS 4.9* 4.7*  --  4.6  GLUCOSE 188* 109* 135* 152*    CBG (last 3)   Recent Labs  09/10/13 2002 09/10/13 2351 09/11/13 0402  GLUCAP 123* 134* 141*    Scheduled Meds: . antiseptic oral rinse  15 mL Mouth Rinse QID  . chlorhexidine  15 mL Mouth/Throat BID  . enoxaparin (LOVENOX) injection  40 mg Subcutaneous Q24H  . furosemide  40 mg Intravenous Once  . imipenem-cilastatin  500 mg Intravenous Q8H  . insulin aspart  0-15 Units Subcutaneous Q4H  . vancomycin  500 mg Oral Q6H   And  . metronidazole  500 mg Intravenous Q8H  . pantoprazole (PROTONIX) IV  40 mg Intravenous Q12H  . sodium chloride  10-40 mL Intracatheter Q12H    Continuous Infusions: . sodium chloride 20 mL/hr at 09/10/13 2000  . Marland Kitchen.TPN (CLINIMIX-E) Adult 100 mL/hr at 09/11/13 0700   And  . fat emulsion 480 kcal (09/11/13 0800)  . Marland Kitchen.TPN (CLINIMIX-E) Adult     And  . fat emulsion      Maureen ChattersKatie Dhani Dannemiller, RD, LDN Pager #: [phone] After-Hours Pager #: [phone]

## 2013-09-11 NOTE — Progress Notes (Signed)
PARENTERAL NUTRITION CONSULT NOTE - FOLLOW UP  Pharmacy Consult for TPN Indication: Prolonged ileus  No Known Allergies  Patient Measurements: Height: _0  (180.3 cm) Weight: 230 lb 9.6 oz (104.6 kg) IBW/kg (Calculated) : 75.3 Vital Signs: Temp: 98 F (36.7 C) (01/22 0758) Temp src: Oral (01/22 0758) BP: 185/96 mmHg (01/22 0900) Pulse Rate: 103 (01/22 0900) Intake/Output from previous day: 01/21 0701 - 01/22 0700 In: 2522.6 [I.V.:402.6; IV Piggyback:700; TPN:1420] Out: 6290 [Urine:6235; Drains:55] Intake/Output from this shift: Total I/O In: 190 [I.V.:20; IV Piggyback:50; TPN:120] Out: -   Labs:  Recent Labs  09/10/13 0435  WBC 20.2*  HGB 10.8*  HCT 32.2*  PLT 438*     Recent Labs  09/09/13 0430 09/10/13 0435 09/11/13 0407  NA 142 145 148*  K 2.8* 3.0* 3.4*  CL 92* 98 103  CO2 35* 32 29  GLUCOSE 109* 135* 152*  BUN 81* 76* 63*  CREATININE 2.14* 1.97* 1.92*  CALCIUM 8.1* 8.0* 8.0*  MG  --  2.6* 2.6*  PHOS 4.7*  --  4.6  PROT  --   --  7.0  ALBUMIN  --   --  2.0*  AST  --   --  33  ALT  --   --  25  ALKPHOS  --   --  178*  BILITOT  --   --  0.8   Estimated Creatinine Clearance: 57.3 ml/min (by C-G formula based on Cr of 1.92).    Recent Labs  09/10/13 2002 09/10/13 2351 09/11/13 0402  GLUCAP 123* 134* 141*    Insulin Requirements in the past 24 hours:   20 units insulin total in TPN + 10 units from SSI  Current Nutrition:  Clinimix E 5/15 at 149m/hr + lipid emulsion 20% at 162mhr. Provides 120g protein and 2352kcal per day  Nutritional Goals:  Goal 2500 kcal and 150-160gm protein per RD recommendations 09/02/13.   Assessment:  [age].o. male presented to ED 1/8 with abd pain. CT consistent with perforated stomach- ulcer due to NSAID use, pneumoperitoneum, peritonitis. Taken emergently to OR where he underwent exp lap, debridement of necrotic stomach, repair of perforated stomach. Contrast study showed no leak from stomach or small bowel.   Off Pivot TF since 1/19.  GI: remains NPO after assessment by speech 1/20; abdomen is soft. No extravasation of contrast on CT. Surgery following closely. With diarrhea d/t CDiff  Endo: No h/o DM. CBGs have been elevated on TPN- insulin requirements as above  Lytes: Na 148, K 3.4 (in the process of receiving 2 runs per CCM orders), Phos 4.6, Mag 2.6, CorCa 9.6 (CoCa X phos = 44)  Renal: Cr 1.92, UOP 2.5 ml/kg/hr- Lasix drip has been stopped  Hepatobil: LFTs wnl with exception of elevated Alk Phos. Prealbumin 9.5, trigs 223 (from 1/19)  ID: CDiff PCR positive 1/21- IV Flagyl and PO vancomycin have been added. With negative sputum culture, IV vancomycin and micafungin have been stopped. Primaxin to continue for abdominal infection. WBC 20.2, Tmax 99.3  TPN Access: triple lumen PICC (1/20) TPN day#: 14  Plan:  1. Continue Clinimix E 5/15 at 10072mr and lipid emulsion 20% at 15m35m. This provides 120g protein and 2352kcal per day. 2. Continue multivitamin and full trace elements in TPN 3. Increase regular insulin in TPN bag to 24units  4. Continue SSI and CBGs as ordered 5. TPN labs as ordered 6. Follow up SLP trial with solids today (to be done if MD approves trials)  Dorothey Oetken D. Adriann Ballweg, PharmD, BCPS Clinical Pharmacist Pager: [phone] 09/11/2013 9:05 AM

## 2013-09-11 NOTE — Progress Notes (Signed)
14 Days Post-Op  Subjective: Awake but confused Gets agitated easily Denies abdominal pain  Objective: Vital signs in last 24 hours: Temp:  [98 F (36.7 C)-99.3 F (37.4 C)] 98 F (36.7 C) (01/22 0758) Pulse Rate:  [77-111] 110 (01/22 0930) Resp:  [21-39] 30 (01/22 0930) BP: (137-197)/(68-109) 182/99 mmHg (01/22 0930) SpO2:  [88 %-97 %] 95 % (01/22 0930) Weight:  [230 lb 9.6 oz (104.6 kg)] 230 lb 9.6 oz (104.6 kg) (01/22 0500) Last BM Date: 09/11/13  Intake/Output from previous day: 01/21 0701 - 01/22 0700 In: 2522.6 [I.V.:402.6; IV Piggyback:700; TPN:1420] Out: 6290 [Urine:6235; Drains:55] Intake/Output this shift: Total I/O In: 300 [I.V.:20; IV Piggyback:50; TPN:230] Out: 350 [Urine:350]  Abdomen soft, VAC in place, drains purulent Lab Results:   Recent Labs  09/10/13 0435  WBC 20.2*  HGB 10.8*  HCT 32.2*  PLT 438*   BMET  Recent Labs  09/10/13 0435 09/11/13 0407  NA 145 148*  K 3.0* 3.4*  CL 98 103  CO2 32 29  GLUCOSE 135* 152*  BUN 76* 63*  CREATININE 1.97* 1.92*  CALCIUM 8.0* 8.0*   PT/INR No results found for this basename: LABPROT, INR,  in the last 72 hours ABG  Recent Labs  09/08/13 2232 09/09/13 0536  PHART 7.627* 7.581*  HCO3 35.4* 38.6*    Studies/Results: Ct Abdomen Pelvis W Contrast  09/10/2013   CLINICAL DATA:  Increasing white blood cell count postop  EXAM: CT ABDOMEN AND PELVIS WITH CONTRAST  TECHNIQUE: Multidetector CT imaging of the abdomen and pelvis was performed using the standard protocol following bolus administration of intravenous contrast.  CONTRAST:  OMNIPAQUE IOHEXOL 300 MG/ML  SOLN  COMPARISON:  09/05/2013  FINDINGS: Small item lateral left pleural effusions. There is associated dependent lower lobe atelectasis, with milder subsegmental atelectasis at the base of the right middle lobe and left upper lobe lingula. Lung base parenchymal opacity has mildly improved from the prior study.  There is a small amount of  ascites. In ill-defined focal collection of fluid lies in the left upper quadrant adjacent to the spleen measuring 4.4 cm x 4.1 cm in greatest transverse dimensions. This is similar to the prior exam. Bubbles of intraperitoneal air are scattered throughout the mesenteric. This is new. There is a focus of air along the lesser curvature of the proximal stomach. This could potentially be a small defect in the wall. However, there is no extraluminal contrast. The mesentery shows ill-defined areas of increased attenuation consistent with edema without change from the prior study. Two drainage catheters lie in the central upper abdomen, also stable.  Liver is unremarkable. Spleen is normal in size. There are irregular hypo attenuating areas in the spleen are likely from differential enhancement. No splenic mass is seen. Normal gallbladder. The pancreas is unremarkable. No bile duct dilation no adrenal masses. Small nonobstructing stone in the lower pole of the left kidney. No renal masses. No hydronephrosis. Symmetric enhancement and excretion. The bladder is unremarkable, partly decompressed by a Foley catheter  There is no bowel distention to suggest obstruction or significant adynamic ileus. No bowel wall thickening.  No osteoblastic or osteolytic lesions.  IMPRESSION: 1. Bilateral pleural effusions similar to the prior exam, but less lung base parenchymal opacity. Lung base opacity in the current exam is most consistent with atelectasis. 2. Small amount of ascites without significant change. 3. Focal fluid collection along the gastrosplenic ligament, also without significant change. This could potentially be an infected fluid collection. No other focal  areas of fluid collection. 4. Diffuse ill-defined increased attenuation throughout the mesenteric and along the omentum. There is associated extraluminal bubbles of air scattered within the central upper mesenteric. This air was not present on the prior exam, and  suggests a small leak from a viscus. The air may potentially have been introduced from the drainage catheters, however. A possible wall defect lies along the proximal stomach lesser curvature. There is no extraluminal contrast, however.   Electronically Signed   By: Amie Portlandavid  Ormond M.D.   On: 09/10/2013 14:25   Dg Chest Port 1 View  09/10/2013   CLINICAL DATA:  Respiratory failure  EXAM: PORTABLE CHEST - 1 VIEW  COMPARISON:  Portable chest x-ray dated 09 September 2013.  FINDINGS: The lung volumes are low. The interstitial markings of both lungs remain increased and are slightly more conspicuous today. The right hemidiaphragm remains obscured. There is blunting of the left lateral costophrenic angle. The cardiopericardial silhouette is enlarged. The pulmonary vascularity is engorged and indistinct.  The PICC line tip lies in the region of the junction of the SVC with the right atrium.  IMPRESSION: 1. Confluent density at the right lung base is consistent with atelectasis or pneumonia. Small to moderate-sized bilateral pleural effusions are suspected. 2. The appearance of the cardiac silhouette and pulmonary interstitium is worrisome for CHF. Overall there may have been slight interval deterioration in the appearance of the chest since yesterday's study.   Electronically Signed   By: David  Swazil[place]   On: 09/10/2013 07:37    Anti-infectives: Anti-infectives   Start     Dose/Rate Route Frequency Ordered Stop   09/10/13 2200  metroNIDAZOLE (FLAGYL) IVPB 500 mg     500 mg 100 mL/hr over 60 Minutes Intravenous 3 times per day 09/10/13 1802 09/24/13 1359   09/10/13 2000  vancomycin (VANCOCIN) 50 mg/mL oral solution 500 mg     500 mg Oral 4 times per day 09/10/13 1802 09/24/13 1159   09/10/13 1800  vancomycin (VANCOCIN) 50 mg/mL oral solution 500 mg  Status:  Discontinued     500 mg Oral 4 times per day 09/10/13 1432 09/10/13 1802   09/10/13 1600  metroNIDAZOLE (FLAGYL) IVPB 500 mg  Status:  Discontinued     500  mg 100 mL/hr over 60 Minutes Intravenous 3 times per day 09/10/13 1432 09/10/13 1802   09/09/13 1602  vancomycin (VANCOCIN) 1,250 mg in sodium chloride 0.9 % 250 mL IVPB  Status:  Discontinued     1,250 mg 166.7 mL/hr over 90 Minutes Intravenous Every 24 hours 09/09/13 1057 09/10/13 1432   09/06/13 1600  imipenem-cilastatin (PRIMAXIN) 500 mg in sodium chloride 0.9 % 100 mL IVPB     500 mg 200 mL/hr over 30 Minutes Intravenous 3 times per day 09/06/13 1310     09/03/13 1100  imipenem-cilastatin (PRIMAXIN) 500 mg in sodium chloride 0.9 % 100 mL IVPB  Status:  Discontinued     500 mg 200 mL/hr over 30 Minutes Intravenous Every 6 hours 09/03/13 1007 09/06/13 1310   09/02/13 1300  vancomycin (VANCOCIN) IVPB 1000 mg/200 mL premix  Status:  Discontinued     1,000 mg 200 mL/hr over 60 Minutes Intravenous Every 12 hours 09/01/13 1527 09/07/13 1340   08/30/13 2000  vancomycin (VANCOCIN) IVPB 750 mg/150 ml premix  Status:  Discontinued     750 mg 150 mL/hr over 60 Minutes Intravenous Every 8 hours 08/30/13 1356 09/01/13 1527   08/29/13 2200  micafungin (MYCAMINE) 100 mg in  sodium chloride 0.9 % 100 mL IVPB  Status:  Discontinued     100 mg 100 mL/hr over 1 Hours Intravenous Every 24 hours 08/29/13 2036 09/10/13 1432   08/29/13 0400  vancomycin (VANCOCIN) IVPB 1000 mg/200 mL premix  Status:  Discontinued     1,000 mg 200 mL/hr over 60 Minutes Intravenous Every 8 hours 08/28/13 2358 08/30/13 1356   08/29/13 0400  piperacillin-tazobactam (ZOSYN) IVPB 3.375 g  Status:  Discontinued     3.375 g 12.5 mL/hr over 240 Minutes Intravenous 3 times per day 08/28/13 2358 09/03/13 1002   08/28/13 1845  [MAR Hold]  vancomycin (VANCOCIN) IVPB 1000 mg/200 mL premix     (On MAR Hold since 08/28/13 1957)   1,000 mg 200 mL/hr over 60 Minutes Intravenous  Once 08/28/13 1831 08/28/13 1955   08/28/13 1845  [MAR Hold]  piperacillin-tazobactam (ZOSYN) IVPB 3.375 g     (On MAR Hold since 08/28/13 1957)   3.375 g 100 mL/hr  over 30 Minutes Intravenous  Once 08/28/13 1831 08/28/13 2056      Assessment/Plan: s/p Procedure(s): EXPLORATORY LAPAROTOMY,debridment of nacrotic stomach,and primary repair of perforated stomach. (N/A)  C.diff colitis post op  Continue current care PT Allow sips  LOS: 14 days    Destini Cambre A 09/11/2013

## 2013-09-11 NOTE — Significant Event (Signed)
Bedside u/s chest performed.  Consolidation Rt base, with minimal pleural fluid.  Small effusion on lt.  Defer thoracentesis attempt for now.  Coralyn HellingVineet Hinton Luellen, MD [facility] 09/11/2013, 10:47 AM Pager:  [phone] After 3pm call: [phone]

## 2013-09-12 ENCOUNTER — Inpatient Hospital Stay (HOSPITAL_COMMUNITY): Payer: Medicaid Other

## 2013-09-12 DIAGNOSIS — A0472 Enterocolitis due to Clostridium difficile, not specified as recurrent: Secondary | ICD-10-CM

## 2013-09-12 LAB — BASIC METABOLIC PANEL
BUN: 63 mg/dL — ABNORMAL HIGH (ref 6–23)
CO2: 27 meq/L (ref 19–32)
CREATININE: 2.01 mg/dL — AB (ref 0.50–1.35)
Calcium: 7.7 mg/dL — ABNORMAL LOW (ref 8.4–10.5)
Chloride: 108 mEq/L (ref 96–112)
GFR calc Af Amer: 43 mL/min — ABNORMAL LOW (ref >90)
GFR calc non Af Amer: 37 mL/min — ABNORMAL LOW (ref >90)
Glucose, Bld: 156 mg/dL — ABNORMAL HIGH (ref 70–99)
POTASSIUM: 3.1 meq/L — AB (ref 3.7–5.3)
Sodium: 150 mEq/L — ABNORMAL HIGH (ref 137–147)

## 2013-09-12 LAB — CBC
HEMATOCRIT: 33.4 % — AB (ref 39.0–52.0)
Hemoglobin: 11 g/dL — ABNORMAL LOW (ref 13.0–17.0)
MCH: 30.1 pg (ref 26.0–34.0)
MCHC: 32.9 g/dL (ref 30.0–36.0)
MCV: 91.5 fL (ref 78.0–100.0)
Platelets: 470 10*3/uL — ABNORMAL HIGH (ref 150–400)
RBC: 3.65 MIL/uL — ABNORMAL LOW (ref 4.22–5.81)
RDW: 14.8 % (ref 11.5–15.5)
WBC: 15.8 10*3/uL — AB (ref 4.0–10.5)

## 2013-09-12 LAB — GLUCOSE, CAPILLARY
GLUCOSE-CAPILLARY: 145 mg/dL — AB (ref 70–99)
GLUCOSE-CAPILLARY: 126 mg/dL — AB (ref 70–99)
GLUCOSE-CAPILLARY: 156 mg/dL — AB (ref 70–99)
Glucose-Capillary: 112 mg/dL — ABNORMAL HIGH (ref 70–99)
Glucose-Capillary: 115 mg/dL — ABNORMAL HIGH (ref 70–99)
Glucose-Capillary: 124 mg/dL — ABNORMAL HIGH (ref 70–99)

## 2013-09-12 MED ORDER — TRACE MINERALS CR-CU-F-FE-I-MN-MO-SE-ZN IV SOLN
INTRAVENOUS | Status: AC
  Administered 2013-09-12: 18:00:00 via INTRAVENOUS
  Filled 2013-09-12: qty 3000

## 2013-09-12 MED ORDER — POTASSIUM CHLORIDE 10 MEQ/50ML IV SOLN
10.0000 meq | INTRAVENOUS | Status: AC
  Administered 2013-09-12 (×3): 10 meq via INTRAVENOUS
  Filled 2013-09-12 (×3): qty 50

## 2013-09-12 MED ORDER — FAT EMULSION 20 % IV EMUL
240.0000 mL | INTRAVENOUS | Status: AC
  Administered 2013-09-12: 240 mL via INTRAVENOUS
  Filled 2013-09-12: qty 250

## 2013-09-12 NOTE — Consult Note (Addendum)
WOC checked status of NPWT VAC dressing this am to make sure that no further issues with blockage from the drainage.  VAC working well, no alarms. Seal intact. Updated change orders to T/Th/Sat, bedside nurse ok to change.   Chinelo Benn Marlena Clipperustin RN, [place] [phone]  Contacted right after completion of this note that the machine has started to alarm blockage similar to yesterday.  I will write orders to dc the VAC if this continues.  Beside nurses x 2 checked dressing and it appears intact and sealed.   Davina PokeM. Orla Jolliff RN,CWOCN

## 2013-09-12 NOTE — Progress Notes (Addendum)
Speech Language Pathology Treatment: Dysphagia  Patient Details Name: James Mccormick MRN: [id number] DOB: 02/17/1966 Today's Date: 09/12/2013 Time: 0810-0820 SLP Time Calculation (min): 10 min  Assessment / Plan / Recommendation Clinical Impression  Respiratory function much improved today, RR stable in 20s. Pt demonstrated ability to consume sips and bites, but there is still question of occasional cough. Concern for aspiration, particularly given finding of fluid in right lung base during yesterdays thoracentesis. Will proceed with FEES today to hopefully initiate safe diet. Plan for 1 pm. Call if pts status changes. James Mccormick, [place] CCC-SLP [phone]    HPI HPI: 48 yo male smoker presented with abdominal pain from perforated gastric ulcer in setting of NSAID.  1/8: Exploratory laparotomy, debridement of necrotic stomach with scissors, primary repair of perforated stomach in 2 layers.  Septic shock, VDRF, a fib post-op.  1/8 ETT >> 1/19.    TPN for nutrition.     Pertinent Vitals NA  SLP Plan  Other (Comment) (FEES)    Recommendations Diet recommendations: Other(comment) (sips and chips, meds) Medication Administration: Whole meds with puree Supervision: Staff to assist with self feeding              Oral Care Recommendations: Oral care Q4 per protocol Follow up Recommendations: Inpatient Rehab Plan: Other (Comment) (FEES)    GO     James Mccormick, [name] 09/12/2013, 8:41 AM

## 2013-09-12 NOTE — Progress Notes (Signed)
Rehab Admissions Coordinator Note:  Patient was screened by Trish MageLogue, Zayvion Stailey M for appropriateness for an Inpatient Acute Rehab Consult.  At this time, we are recommending Inpatient Rehab consult.  Trish MageLogue, Murdock Jellison M 09/12/2013, 8:43 AM  I can be reached at [phone].

## 2013-09-12 NOTE — Progress Notes (Signed)
James Mccormick, wound RN made aware that pt's abd wound vac continuing to alarm occluded and therapy interrupted. Per wound RN, d/c vac and resume wet to dry dressing changes BID. Dr. Magnus IvanBlackman made aware. Will continue to monitor closely. Kerin RansomBlaire Nancye Grumbine, RN

## 2013-09-12 NOTE — Progress Notes (Signed)
eLink Physician-Brief Progress Note Patient Name: Pamalee LeydenChristopher D Ramires DOB: 09/07/1965 MRN: [id number]  Date of Service  09/12/2013   HPI/Events of Note     eICU Interventions  Hypokalemia -repleted    Intervention Category Intermediate Interventions: Electrolyte abnormality - evaluation and management  Ashanty Coltrane V. 09/12/2013, 5:40 AM

## 2013-09-12 NOTE — Progress Notes (Signed)
15 Days Post-Op  Subjective: Still weak and occasionally confused Denies abd pain  Objective: Vital signs in last 24 hours: Temp:  [98.2 F (36.8 C)-99.8 F (37.7 C)] 98.6 F (37 C) (01/23 0717) Pulse Rate:  [90-116] 100 (01/23 0700) Resp:  [19-40] 27 (01/23 0700) BP: (120-186)/(78-132) 162/92 mmHg (01/23 0700) SpO2:  [92 %-100 %] 95 % (01/23 0700) FiO2 (%):  [40 %] 40 % (01/22 1400) Weight:  [222 lb 10.6 oz (101 kg)] 222 lb 10.6 oz (101 kg) (01/23 0247) Last BM Date: 09/11/13  Intake/Output from previous day: 01/22 0701 - 01/23 0700 In: 3950.2 [I.V.:660.2; IV Piggyback:750; TPN:2540] Out: 4840 [Urine:4700; Drains:70; Stool:70] Intake/Output this shift:    Abdomen remains soft with minimal tenderness VAC in place Drains the same  Lab Results:   Recent Labs  09/10/13 0435 09/12/13 0415  WBC 20.2* 15.8*  HGB 10.8* 11.0*  HCT 32.2* 33.4*  PLT 438* 470*   BMET  Recent Labs  09/11/13 0407 09/12/13 0415  NA 148* 150*  K 3.4* 3.1*  CL 103 108  CO2 29 27  GLUCOSE 152* 156*  BUN 63* 63*  CREATININE 1.92* 2.01*  CALCIUM 8.0* 7.7*   PT/INR No results found for this basename: LABPROT, INR,  in the last 72 hours ABG No results found for this basename: PHART, PCO2, PO2, HCO3,  in the last 72 hours  Studies/Results: Ct Abdomen Pelvis W Contrast  09/10/2013   CLINICAL DATA:  Increasing white blood cell count postop  EXAM: CT ABDOMEN AND PELVIS WITH CONTRAST  TECHNIQUE: Multidetector CT imaging of the abdomen and pelvis was performed using the standard protocol following bolus administration of intravenous contrast.  CONTRAST:  OMNIPAQUE IOHEXOL 300 MG/ML  SOLN  COMPARISON:  09/05/2013  FINDINGS: Small item lateral left pleural effusions. There is associated dependent lower lobe atelectasis, with milder subsegmental atelectasis at the base of the right middle lobe and left upper lobe lingula. Lung base parenchymal opacity has mildly improved from the prior study.   There is Mccormick small amount of ascites. In ill-defined focal collection of fluid lies in the left upper quadrant adjacent to the spleen measuring 4.4 cm x 4.1 cm in greatest transverse dimensions. This is similar to the prior exam. Bubbles of intraperitoneal air are scattered throughout the mesenteric. This is new. There is Mccormick focus of air along the lesser curvature of the proximal stomach. This could potentially be Mccormick small defect in the wall. However, there is no extraluminal contrast. The mesentery shows ill-defined areas of increased attenuation consistent with edema without change from the prior study. Two drainage catheters lie in the central upper abdomen, also stable.  Liver is unremarkable. Spleen is normal in size. There are irregular hypo attenuating areas in the spleen are likely from differential enhancement. No splenic mass is seen. Normal gallbladder. The pancreas is unremarkable. No bile duct dilation no adrenal masses. Small nonobstructing stone in the lower pole of the left kidney. No renal masses. No hydronephrosis. Symmetric enhancement and excretion. The bladder is unremarkable, partly decompressed by Mccormick Foley catheter  There is no bowel distention to suggest obstruction or significant adynamic ileus. No bowel wall thickening.  No osteoblastic or osteolytic lesions.  IMPRESSION: 1. Bilateral pleural effusions similar to the prior exam, but less lung base parenchymal opacity. Lung base opacity in the current exam is most consistent with atelectasis. 2. Small amount of ascites without significant change. 3. Focal fluid collection along the gastrosplenic ligament, also without significant change. This  could potentially be an infected fluid collection. No other focal areas of fluid collection. 4. Diffuse ill-defined increased attenuation throughout the mesenteric and along the omentum. There is associated extraluminal bubbles of air scattered within the central upper mesenteric. This air was not present on  the prior exam, and suggests Mccormick small leak from Mccormick viscus. The air may potentially have been introduced from the drainage catheters, however. Mccormick possible wall defect lies along the proximal stomach lesser curvature. There is no extraluminal contrast, however.   Electronically Signed   By: Amie Portland M.D.   On: 09/10/2013 14:25   Dg Chest Port 1 View  09/12/2013   CLINICAL DATA:  Pleural effusions  EXAM: PORTABLE CHEST - 1 VIEW  COMPARISON:  September 10, 2013  FINDINGS: Central catheter tip is in the right atrium. No pneumothorax. There are minimal effusions bilaterally. There is patchy atelectasis in both lower lobes. There has been clearing of patchy consolidation from the right base. There is no new opacity. Heart is prominent with normal pulmonary vascularity. No adenopathy.  IMPRESSION: Patchy bibasilar atelectasis with minimal pleural effusions bilaterally. Partial clearing right base compared to 2 days prior. No new opacity. Stable cardiac prominence. No pneumothorax.   Electronically Signed   By: Bretta Bang M.D.   On: 09/12/2013 07:24    Anti-infectives: Anti-infectives   Start     Dose/Rate Route Frequency Ordered Stop   09/10/13 2200  metroNIDAZOLE (FLAGYL) IVPB 500 mg     500 mg 100 mL/hr over 60 Minutes Intravenous 3 times per day 09/10/13 1802 09/24/13 1359   09/10/13 2000  vancomycin (VANCOCIN) 50 mg/mL oral solution 500 mg     500 mg Oral 4 times per day 09/10/13 1802 09/24/13 1159   09/10/13 1800  vancomycin (VANCOCIN) 50 mg/mL oral solution 500 mg  Status:  Discontinued     500 mg Oral 4 times per day 09/10/13 1432 09/10/13 1802   09/10/13 1600  metroNIDAZOLE (FLAGYL) IVPB 500 mg  Status:  Discontinued     500 mg 100 mL/hr over 60 Minutes Intravenous 3 times per day 09/10/13 1432 09/10/13 1802   09/09/13 1602  vancomycin (VANCOCIN) 1,250 mg in sodium chloride 0.9 % 250 mL IVPB  Status:  Discontinued     1,250 mg 166.7 mL/hr over 90 Minutes Intravenous Every 24 hours  09/09/13 1057 09/10/13 1432   09/06/13 1600  imipenem-cilastatin (PRIMAXIN) 500 mg in sodium chloride 0.9 % 100 mL IVPB     500 mg 200 mL/hr over 30 Minutes Intravenous 3 times per day 09/06/13 1310     09/03/13 1100  imipenem-cilastatin (PRIMAXIN) 500 mg in sodium chloride 0.9 % 100 mL IVPB  Status:  Discontinued     500 mg 200 mL/hr over 30 Minutes Intravenous Every 6 hours 09/03/13 1007 09/06/13 1310   09/02/13 1300  vancomycin (VANCOCIN) IVPB 1000 mg/200 mL premix  Status:  Discontinued     1,000 mg 200 mL/hr over 60 Minutes Intravenous Every 12 hours 09/01/13 1527 09/07/13 1340   08/30/13 2000  vancomycin (VANCOCIN) IVPB 750 mg/150 ml premix  Status:  Discontinued     750 mg 150 mL/hr over 60 Minutes Intravenous Every 8 hours 08/30/13 1356 09/01/13 1527   08/29/13 2200  micafungin (MYCAMINE) 100 mg in sodium chloride 0.9 % 100 mL IVPB  Status:  Discontinued     100 mg 100 mL/hr over 1 Hours Intravenous Every 24 hours 08/29/13 2036 09/10/13 1432   08/29/13 0400  vancomycin (VANCOCIN)  IVPB 1000 mg/200 mL premix  Status:  Discontinued     1,000 mg 200 mL/hr over 60 Minutes Intravenous Every 8 hours 08/28/13 2358 08/30/13 1356   08/29/13 0400  piperacillin-tazobactam (ZOSYN) IVPB 3.375 g  Status:  Discontinued     3.375 g 12.5 mL/hr over 240 Minutes Intravenous 3 times per day 08/28/13 2358 09/03/13 1002   08/28/13 1845  [MAR Hold]  vancomycin (VANCOCIN) IVPB 1000 mg/200 mL premix     (On MAR Hold since 08/28/13 1957)   1,000 mg 200 mL/hr over 60 Minutes Intravenous  Once 08/28/13 1831 08/28/13 1955   08/28/13 1845  [MAR Hold]  piperacillin-tazobactam (ZOSYN) IVPB 3.375 g     (On MAR Hold since 08/28/13 1957)   3.375 g 100 mL/hr over 30 Minutes Intravenous  Once 08/28/13 1831 08/28/13 2056      Assessment/Plan: s/p Procedure(s): EXPLORATORY LAPAROTOMY,debridment of nacrotic stomach,and primary repair of perforated stomach. (N/Mccormick)  C.diff  Continuing current care If he passes his  swallowing eval, will start clear liquids  LOS: 15 days    James Mccormick 09/12/2013

## 2013-09-12 NOTE — Progress Notes (Signed)
PARENTERAL NUTRITION CONSULT NOTE - FOLLOW UP  Pharmacy Consult for TPN Indication: Prolonged ileus  No Known Allergies  Patient Measurements: Height: '5\' 11"'  (180.3 cm) Weight: 222 lb 10.6 oz (101 kg) IBW/kg (Calculated) : 75.3 Vital Signs: Temp: 98.6 F (37 C) (01/23 0717) Temp src: Oral (01/23 0717) BP: 162/92 mmHg (01/23 0700) Pulse Rate: 100 (01/23 0700) Intake/Output from previous day: 01/22 0701 - 01/23 0700 In: 3950.2 [I.V.:660.2; IV Piggyback:750; TPN:2540] Out: 0600 [Urine:4700; Drains:70; Stool:70] Intake/Output from this shift:    Labs:  Recent Labs  09/10/13 0435 09/12/13 0415  WBC 20.2* 15.8*  HGB 10.8* 11.0*  HCT 32.2* 33.4*  PLT 438* 470*    Recent Labs  09/10/13 0435 09/11/13 0407 09/12/13 0415  NA 145 148* 150*  K 3.0* 3.4* 3.1*  CL 98 103 108  CO2 32 29 27  GLUCOSE 135* 152* 156*  BUN 76* 63* 63*  CREATININE 1.97* 1.92* 2.01*  CALCIUM 8.0* 8.0* 7.7*  MG 2.6* 2.6*  --   PHOS  --  4.6  --   PROT  --  7.0  --   ALBUMIN  --  2.0*  --   AST  --  33  --   ALT  --  25  --   ALKPHOS  --  178*  --   BILITOT  --  0.8  --    Estimated Creatinine Clearance: 53.8 ml/min (by C-G formula based on Cr of 2.01).    Recent Labs  09/11/13 1933 09/11/13 2359 09/12/13 0400  GLUCAP 141* 115* 145*   Insulin Requirements in the past 24 hours:   24 units insulin total in TPN + 4 units from SSI since 1800 last night  Current Nutrition:  Clinimix E 5/15 at 151m/hr + lipid emulsion 20% at 15mhr  Nutritional Goals:  Goal 2250-2450 kcal and 150-160gm protein per RD 09/11/13  Assessment:  [age].o. male presented to ED 1/8 with abd pain. CT consistent with perforated stomach- ulcer due to NSAID use, pneumoperitoneum, peritonitis. Taken emergently to OR where he underwent exp lap, debridement of necrotic stomach, repair of perforated stomach. Contrast study showed no leak from stomach or small bowel.  Off Pivot TF since 1/19.  GI: remains NPO after  assessment by speech 1/22; abdomen is soft. No extravasation of contrast on CT. Surgery following closely. With diarrhea d/t CDiff  Endo: No h/o DM - CBGs are at goal on SSI + insulin in TPN  Lytes: Na 150 (slight trend up - receiving very little from TPN so likely related to other issues), K 3.1 (in the process of receiving 3 runs per CCM orders), Phos 4.6, Mag 2.6, CorCa 10.1 (CoCa X phos = 46.46)  Renal: Scr 2.01, UOP 1.9 ml/kg/hr  Hepatobil: LFTs wnl with exception of elevated Alk Phos. Prealbumin 9.5, trigs 223 (from 1/19)  ID: CDiff PCR positive 1/21- IV Flagyl and PO vancomycin started. Primaxin D#10 continues - Afebrile, WBC 15.8  Vanc 1/8>>1/21 Zosyn 1/8>>1/14 Micafungin 1/9>>1/21 Primaxin 1/14>> Vanc PO 1/21>> Flagyl 1/21>>  TPN Access: triple lumen PICC (1/20) TPN day#: 15  Plan:  1. Continue Clinimix E 5/15 at 1001mr and lipid emulsion 20% at 23m66m (provides 120gm protein/day - 80% goal + 2184 kcal/day - 97% goal) Unable to meet full nutrition goals due to limitations of pre-mix clinimix bags 2. Continue multivitamin and full trace elements in TPN 3. Continue 24 units insulin in TPN 4. Continue SSI and CBGs as ordered 5. F/u AM BMET, phos and  Mg   Salome Arnt, PharmD, BCPS Pager # [phone] 09/12/2013 8:01 AM

## 2013-09-12 NOTE — Progress Notes (Signed)
[facility]     Name: James Mccormick MRN: [id number] DOB: Jan 13, 1966    ADMISSION DATE:  08/28/2013 CONSULTATION DATE:  08/28/13 LOS 15 days  REFERRING MD :  Andrey Campanile PRIMARY SERVICE: [facility]  CHIEF COMPLAINT:  Abdominal Pain  BRIEF PATIENT DESCRIPTION:  48 yo male smoker presented with abdominal pain from perforated gastric ulcer in setting of NSAID.  PCCM consulted to assist with septic shock and respiratory failure management.  SIGNIFICANT EVENTS: 1/8: Exploratory laparotomy, debridement of necrotic stomach with scissors, primary repair of perforated stomach in 2 layers.  Septic shock, VDRF, a fib post-op. 1/9: Cardiology consulted . ECHO EF 45% 1/11: still pressor dependent, NSR for > 24 hrs. Amiodarone stopped.  1/12: remain son levophed,. Left radial a line dc'ed 09/02/13: off levophed. Encephalopathic - randomly agitated. Mmit 08/28/2013 . TPN continuesoving around, biting tube, looks randomly, does not track. Failed SBT. Febrile. + 24 Liters. Back on neo 09/03/13: ARDS Protocol with 48h nimbex started. Left radial artery occluded - seen by VVS. Expectant mgmt. 1/15: 50% fio2 on ARDS protocol with nimbex. Left radial artery occlusion events noted. 1/17 off paralytics 1/19 Extubated 1/20 Precedex for agitation 1/20 Speech therapy swallow eval; d/c solu cortef 1/21 Diarrhea, increased WBC 1/22 Trial on BiPAP  STUDIES:  1/8 CT abd/pelvis >> pneumoperitoneum, Lt kidney stone, diverticulosis 1/9 Echo >> mild LVH, EF 40 to 45%, grade 1 diastolic dysfx 1/14 Lt upper ext arterial duplex >> no evidence for thrombosis in Lt arm 1/16 CT abd/pelvis >> b/l pleural effusions, areas of GGO and consolidation, no leak from stomach or small bowel  LINES / TUBES: 1/8 ETT >> 1/19 1/8 Rt IJ CVL >> 1/20 1/8 Lt radial aline >>>1/12 1/14 Rt radial aline>>1/19 1/20 Rt PICC >>  CULTURES: Blood 1/9 >> negative Tracheal aspirate 1/16 >> negative C diff 1/21 >>POS   ANTIBIOTICS: Vancomycin 1/8  >>>1/20 Zosyn 1/8 >>>>1/14 Micafungin 1/9 >>>1/21 Imipenem 1/14 >>> 1/23 Flagyl 1/21>>> Vancomycin (oral) 1/21>>>  SUBJECTIVE:  Remains on precedex.  WOB much improved.  Denies chest pain.  Abd pain better.  VITAL SIGNS: Temp:  [98.2 F (36.8 C)-99.8 F (37.7 C)] 98.6 F (37 C) (01/23 0717) Pulse Rate:  [90-112] 97 (01/23 0900) Resp:  [19-34] 26 (01/23 0900) BP: (120-180)/(78-132) 158/87 mmHg (01/23 0900) SpO2:  [94 %-100 %] 96 % (01/23 0900) FiO2 (%):  [40 %] 40 % (01/22 1400) Weight:  [222 lb 10.6 oz (101 kg)] 222 lb 10.6 oz (101 kg) (01/23 0247) Room air  INTAKE / OUTPUT: Intake/Output     01/22 0701 - 01/23 0700 01/23 0701 - 01/24 0700   I.V. (mL/kg) 678.5 (6.7) 116.6 (1.2)   IV Piggyback 750 50   TPN 2650 220   Total Intake(mL/kg) 4078.5 (40.4) 386.6 (3.8)   Urine (mL/kg/hr) 4850 (2) 400 (1.1)   Drains 70 (0)    Stool 70 (0)    Total Output 4990 400   Net -911.6 -13.4          PHYSICAL EXAMINATION: General: no distress Neuro: alert, mildly confused, follows simple commands HEENT: no sinus tenderness Cardiovascular: regular Lungs: no wheeze Abdomen: wound dressing clean Musculoskeletal: ankle edema Skin: area of ecchymosis on Lt forearm  LABS: PULMONARY  Recent Labs Lab 09/06/13 0346 09/07/13 0350 09/08/13 0414 09/08/13 2232 09/09/13 0536  PHART 7.328* 7.362 7.480* 7.627* 7.581*  PCO2ART 51.7* 51.7* 41.3 33.9* 41.1  PO2ART 79.0* 66.0* 87.0 50.0* 87.0  HCO3 26.8* 29.4* 30.9* 35.4* 38.6*  TCO2 28 31 32  36 40  O2SAT 93.0 92.0 97.0 92.0 98.0   CBC  Recent Labs Lab 09/08/13 0400 09/10/13 0435 09/12/13 0415  HGB 10.5* 10.8* 11.0*  HCT 31.2* 32.2* 33.4*  WBC 14.0* 20.2* 15.8*  PLT 337 438* 470*   CARDIAC   No results found for this basename: TROPONINI,  in the last 168 hours  Recent Labs Lab 09/06/13 0408  PROBNP 986.2*   CHEMISTRY  Recent Labs Lab 09/06/13 0408  09/07/13 0350 09/08/13 0400 09/09/13 0430 09/10/13 0435  09/11/13 0407 09/12/13 0415  NA 137  --  137 138 142 145 148* 150*  K 4.0  < > 3.8 3.0* 2.8* 3.0* 3.4* 3.1*  CL 98  --  95* 94* 92* 98 103 108  CO2 25  --  25 29 35* 32 29 27  GLUCOSE 120*  --  174* 188* 109* 135* 152* 156*  BUN 40*  --  56* 74* 81* 76* 63* 63*  CREATININE 2.01*  --  2.17* 2.23* 2.14* 1.97* 1.92* 2.01*  CALCIUM 7.7*  --  7.8* 7.9* 8.1* 8.0* 8.0* 7.7*  MG 2.1  --  2.2 2.0  --  2.6* 2.6*  --   PHOS 5.5*  --  5.6* 4.9* 4.7*  --  4.6  --   < > = values in this interval not displayed. Estimated Creatinine Clearance: 53.8 ml/min (by C-G formula based on Cr of 2.01).  LIVER  Recent Labs Lab 09/08/13 0400 09/11/13 0407  AST 32 33  ALT 19 25  ALKPHOS 134* 178*  BILITOT 1.0 0.8  PROT 6.5 7.0  ALBUMIN 1.5* 2.0*   ENDOCRINE CBG (last 3)   Recent Labs  09/11/13 2359 09/12/13 0400 09/12/13 0713  GLUCAP 115* 145* 126*   IMAGING x48h  Ct Abdomen Pelvis W Contrast  09/10/2013   CLINICAL DATA:  Increasing white blood cell count postop  EXAM: CT ABDOMEN AND PELVIS WITH CONTRAST  TECHNIQUE: Multidetector CT imaging of the abdomen and pelvis was performed using the standard protocol following bolus administration of intravenous contrast.  CONTRAST:  100mL OMNIPAQUE IOHEXOL 300 MG/ML  SOLN  COMPARISON:  09/05/2013  FINDINGS: Small item lateral left pleural effusions. There is associated dependent lower lobe atelectasis, with milder subsegmental atelectasis at the base of the right middle lobe and left upper lobe lingula. Lung base parenchymal opacity has mildly improved from the prior study.  There is a small amount of ascites. In ill-defined focal collection of fluid lies in the left upper quadrant adjacent to the spleen measuring 4.4 cm x 4.1 cm in greatest transverse dimensions. This is similar to the prior exam. Bubbles of intraperitoneal air are scattered throughout the mesenteric. This is new. There is a focus of air along the lesser curvature of the proximal stomach. This  could potentially be a small defect in the wall. However, there is no extraluminal contrast. The mesentery shows ill-defined areas of increased attenuation consistent with edema without change from the prior study. Two drainage catheters lie in the central upper abdomen, also stable.  Liver is unremarkable. Spleen is normal in size. There are irregular hypo attenuating areas in the spleen are likely from differential enhancement. No splenic mass is seen. Normal gallbladder. The pancreas is unremarkable. No bile duct dilation no adrenal masses. Small nonobstructing stone in the lower pole of the left kidney. No renal masses. No hydronephrosis. Symmetric enhancement and excretion. The bladder is unremarkable, partly decompressed by a Foley catheter  There is no bowel distention to suggest  obstruction or significant adynamic ileus. No bowel wall thickening.  No osteoblastic or osteolytic lesions.  IMPRESSION: 1. Bilateral pleural effusions similar to the prior exam, but less lung base parenchymal opacity. Lung base opacity in the current exam is most consistent with atelectasis. 2. Small amount of ascites without significant change. 3. Focal fluid collection along the gastrosplenic ligament, also without significant change. This could potentially be an infected fluid collection. No other focal areas of fluid collection. 4. Diffuse ill-defined increased attenuation throughout the mesenteric and along the omentum. There is associated extraluminal bubbles of air scattered within the central upper mesenteric. This air was not present on the prior exam, and suggests a small leak from a viscus. The air may potentially have been introduced from the drainage catheters, however. A possible wall defect lies along the proximal stomach lesser curvature. There is no extraluminal contrast, however.   Electronically Signed   By: Amie Portland M.D.   On: 09/10/2013 14:25   Dg Chest Port 1 View  09/12/2013   CLINICAL DATA:  Pleural  effusions  EXAM: PORTABLE CHEST - 1 VIEW  COMPARISON:  September 10, 2013  FINDINGS: Central catheter tip is in the right atrium. No pneumothorax. There are minimal effusions bilaterally. There is patchy atelectasis in both lower lobes. There has been clearing of patchy consolidation from the right base. There is no new opacity. Heart is prominent with normal pulmonary vascularity. No adenopathy.  IMPRESSION: Patchy bibasilar atelectasis with minimal pleural effusions bilaterally. Partial clearing right base compared to 2 days prior. No new opacity. Stable cardiac prominence. No pneumothorax.   Electronically Signed   By: Bretta Bang M.D.   On: 09/12/2013 07:24   ASSESSMENT / PLAN:  PULMONARY A:  Acute respiratory failure with ALI in setting of peritonitis >> much improved 1/23. B/L pleural effusions with passive atelectasis. Hx of smoking >> air trapping on vent 1/10. P: -oxygen to keep SpO2 > 92% -f/u CXR intermittently -BiPAP prn  CARDIAC A:  Septic shock 2nd to peritonitis >> resolved. A fib with RVR >>resolved. Was on IV amiodarone 1/9 to 1/10. Acute systolic heart failure likely 2nd to sepsis. NSTEMI from demand ischemia. Hx of HTN with chronic diastolic dysfx. P:  -prn hydralazine, labetalol  -add scheduled oral meds once able to swallow pills -will need cardiac evaluation and f/u Echo at some point  RENAL A:   Acute kidney injury 2nd to hypovolemia, and ATN in setting of shock >> improving. Metabolic acidosis >> resolved. Hypernatremia 2nd to diuresis. Hypokalemia. Hypervolemia >> improving. P:   -f/u renal fx, urine outpt -f/u and replace electrolytes as needed -KVO IV fluids -hold lasix 1/23  GASTROINTESTINAL A:   Perforated gastric ulcer in setting of NSAID use as outpt. Protein calorie malnutrition. Dypshagia. P:   -Post -op care per CCS -Protonix BID -TNA per [facility] -for FEES with speech 1/23 >> depending on results might be able to start  clears  HEMATOLOGIC A:   Anemia of critical illness Low platelet 2nd to sepsis; HIT negative 1/13 >> resolved. P:  -f/u CBC intermittently -SQ lovenox for DVT prevention.  INFECTIOUS A:  Septic shock 2nd to peritonitis. Recurrent fever with possible HCAP 1/14. Diarrhea, increased WBC 1/21 2nd to C diff >> improving. P:   -D/c primaxin 1/23 -Day 3 of IV flagyl >> add po vancomycin when able to swallow pills  ENDOCRINE A:   Hyperglycemia. Relative adrenal insufficiency in setting of sepsis >> cortisol 14.7 from 1/17  >> resolved. P:   -  SSI   NEUROLOGIC A:   Acute encephalopathy 2nd to septic shock >> improved. Agitated delirium after extubation 1/19 >> improved with precedex. Deconditioning. P:   -wean off precedex as tolerated -prn fentanyl, ativan -PT evaluation  Summary: Much improved compared to 1/22.  Will d/c primaxin and continue Abx for C diff only.  Hopefully will be able to start oral feedings soon.  Once he is off precedex, then will be able to progress out of ICU.  Coralyn Helling, MD [facility] Pulmonary/Critical Care 09/12/2013, 10:40 AM Pager:  [phone] After 3pm call: [phone]

## 2013-09-12 NOTE — Procedures (Signed)
Objective Swallowing Evaluation: Fiberoptic Endoscopic Evaluation of Swallowing  Patient Details  Name: James Mccormick MRN: [id number] Date of Birth: 10/28/1965  Today's Date: 09/12/2013 Time: 1310-1340 SLP Time Calculation (min): 30 min  Past Medical History:  Past Medical History  Diagnosis Date  . GERD (gastroesophageal reflux disease)   . Hypertension    Past Surgical History:  Past Surgical History  Procedure Laterality Date  . Neck surgery    . Laparotomy N/A 08/28/2013    Procedure: EXPLORATORY LAPAROTOMY,debridment of nacrotic stomach,and primary repair of perforated stomach.;  Surgeon: James Ina, MD;  Location: [facility] OR;  Service: General;  Laterality: N/A;   HPI:  48 yo male smoker presented with abdominal pain from perforated gastric ulcer in setting of NSAID.  1/8: Exploratory laparotomy, debridement of necrotic stomach with scissors, primary repair of perforated stomach in 2 layers.  Septic shock, VDRF, a fib post-op.  1/8 ETT >> 1/19.    TPN for nutrition.       Assessment / Plan / Recommendation Clinical Impression  Dysphagia Diagnosis: Mild pharyngeal phase dysphagia Clinical impression: Pt demonstrates adequate airway protection and instances of penetration or aspiration during FEES. He did have mild base of tongue weakness resulting in moderate residuals in valleculae requiring several sips of liquids to transit. For now, pt is recommended to start a clear liquid diet (per surgery). Pt must follow basic aspiration precautions and POs should be held with any respiratory distress (RR consistently above 30). Pt may start soft solids when cleared by surgery, following solids with liquids. SLP will f/u for tolerance.     Treatment Recommendation  Therapy as outlined in treatment plan below    Diet Recommendation Thin liquid   Liquid Administration via: Cup;Straw Medication Administration: Whole meds with liquid Supervision: Staff to assist with self  feeding Compensations: Slow rate;Small sips/bites Postural Changes and/or Swallow Maneuvers: Seated upright 90 degrees    Other  Recommendations Oral Care Recommendations: Oral care BID   Follow Up Recommendations  Inpatient Rehab    Frequency and Duration min 2x/week  2 weeks   Pertinent Vitals/Pain NA    SLP Swallow Goals     General HPI: 48 yo male smoker presented with abdominal pain from perforated gastric ulcer in setting of NSAID.  1/8: Exploratory laparotomy, debridement of necrotic stomach with scissors, primary repair of perforated stomach in 2 layers.  Septic shock, VDRF, a fib post-op.  1/8 ETT >> 1/19.    TPN for nutrition.   Type of Study: Fiberoptic Endoscopic Evaluation of Swallowing Reason for Referral: Objectively evaluate swallowing function Diet Prior to this Study: NPO Temperature Spikes Noted: No Respiratory Status: Nasal cannula History of Recent Intubation: Yes Length of Intubations (days): 11 days Date extubated: 09/08/13 Behavior/Cognition: Alert;Cooperative Oral Cavity - Dentition: Adequate natural dentition Oral Motor / Sensory Function: Within functional limits Self-Feeding Abilities: Able to feed self;Needs assist Patient Positioning: Upright in bed Baseline Vocal Quality: Clear;Low vocal intensity Volitional Cough: Weak Volitional Swallow: Able to elicit Anatomy: Within functional limits Pharyngeal Secretions: Standing secretions in (comment) (above CP)    Reason for Referral Objectively evaluate swallowing function   Oral Phase Oral Preparation/Oral Phase Oral Phase: WFL   Pharyngeal Phase Pharyngeal Phase Pharyngeal Phase: Impaired Pharyngeal - Thin Pharyngeal - Thin Cup: Within functional limits Pharyngeal - Thin Straw: Within functional limits Pharyngeal - Solids Pharyngeal - Puree: Delayed swallow initiation;Pharyngeal residue - valleculae;Pharyngeal residue - pyriform sinuses Pharyngeal - Mechanical Soft: Delayed swallow  initiation;Reduced tongue base retraction;Pharyngeal residue -  valleculae;Pharyngeal residue - pyriform sinuses  Cervical Esophageal Phase    GO    Cervical Esophageal Phase Cervical Esophageal Phase: [facility]        James DittyBonnie Kasara Schomer, MA CCC-SLP [phone] James Mccormick, James NearingBonnie Mccormick 09/12/2013, 4:19 Mccormick

## 2013-09-13 ENCOUNTER — Inpatient Hospital Stay (HOSPITAL_COMMUNITY): Payer: Medicaid Other

## 2013-09-13 LAB — BASIC METABOLIC PANEL
BUN: 58 mg/dL — AB (ref 6–23)
CHLORIDE: 109 meq/L (ref 96–112)
CO2: 25 mEq/L (ref 19–32)
CREATININE: 1.81 mg/dL — AB (ref 0.50–1.35)
Calcium: 7.8 mg/dL — ABNORMAL LOW (ref 8.4–10.5)
GFR calc Af Amer: 49 mL/min — ABNORMAL LOW (ref >90)
GFR calc non Af Amer: 42 mL/min — ABNORMAL LOW (ref >90)
Glucose, Bld: 149 mg/dL — ABNORMAL HIGH (ref 70–99)
Potassium: 3.6 mEq/L — ABNORMAL LOW (ref 3.7–5.3)
Sodium: 148 mEq/L — ABNORMAL HIGH (ref 137–147)

## 2013-09-13 LAB — GLUCOSE, CAPILLARY
GLUCOSE-CAPILLARY: 148 mg/dL — AB (ref 70–99)
GLUCOSE-CAPILLARY: 146 mg/dL — AB (ref 70–99)
Glucose-Capillary: 115 mg/dL — ABNORMAL HIGH (ref 70–99)
Glucose-Capillary: 151 mg/dL — ABNORMAL HIGH (ref 70–99)
Glucose-Capillary: 168 mg/dL — ABNORMAL HIGH (ref 70–99)
Glucose-Capillary: 163 mg/dL — ABNORMAL HIGH (ref 70–99)

## 2013-09-13 LAB — PHOSPHORUS: Phosphorus: 5.6 mg/dL — ABNORMAL HIGH (ref 2.3–4.6)

## 2013-09-13 LAB — MAGNESIUM: Magnesium: 2.6 mg/dL — ABNORMAL HIGH (ref 1.5–2.5)

## 2013-09-13 MED ORDER — POTASSIUM CHLORIDE 10 MEQ/50ML IV SOLN
10.0000 meq | INTRAVENOUS | Status: AC
Start: 2013-09-13 — End: 2013-09-13
  Administered 2013-09-13 (×2): 10 meq via INTRAVENOUS
  Filled 2013-09-13 (×2): qty 50

## 2013-09-13 MED ORDER — SODIUM CHLORIDE 0.45 % IV SOLN
INTRAVENOUS | Status: AC
  Administered 2013-09-13: 20 mL via INTRAVENOUS
  Administered 2013-09-16 – 2013-09-24 (×2): via INTRAVENOUS
  Administered 2013-09-25: 20 mL/h via INTRAVENOUS
  Administered 2013-09-28: 10:00:00 via INTRAVENOUS

## 2013-09-13 MED ORDER — FAT EMULSION 20 % IV EMUL
240.0000 mL | INTRAVENOUS | Status: AC
  Administered 2013-09-13: 240 mL via INTRAVENOUS
  Filled 2013-09-13: qty 250

## 2013-09-13 MED ORDER — SODIUM CHLORIDE 0.9 % IV SOLN
0.0000 ug/h | INTRAVENOUS | Status: DC
  Administered 2013-09-13: 25 ug/h via INTRAVENOUS
  Administered 2013-09-14: 150 ug/h via INTRAVENOUS
  Administered 2013-09-14: 250 ug/h via INTRAVENOUS
  Filled 2013-09-13 (×4): qty 50

## 2013-09-13 MED ORDER — TRACE MINERALS CR-CU-F-FE-I-MN-MO-SE-ZN IV SOLN
INTRAVENOUS | Status: AC
  Administered 2013-09-13: 18:00:00 via INTRAVENOUS
  Filled 2013-09-13: qty 3000

## 2013-09-13 MED ORDER — BIOTENE DRY MOUTH MT LIQD
15.0000 mL | Freq: Four times a day (QID) | OROMUCOSAL | Status: DC
  Administered 2013-09-13 – 2013-09-16 (×11): 15 mL via OROMUCOSAL

## 2013-09-13 MED ORDER — ETOMIDATE 2 MG/ML IV SOLN
40.0000 mg | Freq: Once | INTRAVENOUS | Status: AC
  Administered 2013-09-13: 40 mg via INTRAVENOUS

## 2013-09-13 MED ORDER — MIDAZOLAM HCL 2 MG/2ML IJ SOLN
INTRAMUSCULAR | Status: AC
  Filled 2013-09-13: qty 4

## 2013-09-13 MED ORDER — IPRATROPIUM-ALBUTEROL 0.5-2.5 (3) MG/3ML IN SOLN
3.0000 mL | Freq: Four times a day (QID) | RESPIRATORY_TRACT | Status: DC
  Administered 2013-09-13 – 2013-09-17 (×18): 3 mL via RESPIRATORY_TRACT
  Filled 2013-09-13 (×9): qty 3
  Filled 2013-09-13: qty 6
  Filled 2013-09-13 (×6): qty 3

## 2013-09-13 MED ORDER — ROCURONIUM BROMIDE 50 MG/5ML IV SOLN
100.0000 mg | Freq: Once | INTRAVENOUS | Status: AC
  Administered 2013-09-13: 100 mg via INTRAVENOUS

## 2013-09-13 MED ORDER — MIDAZOLAM HCL 2 MG/2ML IJ SOLN
2.0000 mg | INTRAMUSCULAR | Status: DC | PRN
  Filled 2013-09-13 (×2): qty 2

## 2013-09-13 MED ORDER — SODIUM CHLORIDE 0.9 % IV BOLUS (SEPSIS)
1000.0000 mL | Freq: Once | INTRAVENOUS | Status: AC
  Administered 2013-09-13: 1000 mL via INTRAVENOUS

## 2013-09-13 MED ORDER — MIDAZOLAM HCL 2 MG/2ML IJ SOLN
2.0000 mg | INTRAMUSCULAR | Status: DC | PRN
  Administered 2013-09-13 – 2013-09-15 (×7): 2 mg via INTRAVENOUS
  Filled 2013-09-13 (×6): qty 2

## 2013-09-13 MED ORDER — CHLORHEXIDINE GLUCONATE 0.12 % MT SOLN
15.0000 mL | Freq: Two times a day (BID) | OROMUCOSAL | Status: DC
  Administered 2013-09-13 – 2013-09-16 (×6): 15 mL via OROMUCOSAL
  Filled 2013-09-13 (×6): qty 15

## 2013-09-13 MED ORDER — EPINEPHRINE HCL 0.1 MG/ML IJ SOSY
1.0000 mg | PREFILLED_SYRINGE | Freq: Once | INTRAMUSCULAR | Status: AC
  Administered 2013-09-13: 1 mg via INTRAVENOUS

## 2013-09-13 MED ORDER — FENTANYL BOLUS VIA INFUSION
50.0000 ug | INTRAVENOUS | Status: DC | PRN
Start: 2013-09-13 — End: 2013-09-15
  Filled 2013-09-13: qty 100

## 2013-09-13 MED ORDER — ROCURONIUM BROMIDE 50 MG/5ML IV SOLN: 100.0000 mg/kg | Freq: Once | INTRAVENOUS | Status: DC

## 2013-09-13 MED ORDER — ETOMIDATE 2 MG/ML IV SOLN
40.0000 mg/kg | Freq: Once | INTRAVENOUS | Status: DC
Start: 2013-09-13 — End: 2013-09-13

## 2013-09-13 NOTE — Progress Notes (Signed)
16 Days Post-Op  Subjective: Did have abdominal pain which was mild with liquids  Objective: Vital signs in last 24 hours: Temp:  [97.4 F (36.3 C)-99.7 F (37.6 C)] 98.7 F (37.1 C) (01/24 0818) Pulse Rate:  [85-128] 109 (01/24 0700) Resp:  [17-33] 29 (01/24 0700) BP: (152-186)/(84-124) 155/87 mmHg (01/24 0700) SpO2:  [92 %-100 %] 95 % (01/24 0700) FiO2 (%):  [50 %-60 %] 60 % (01/24 0300) Weight:  [221 lb 12.5 oz (100.6 kg)] 221 lb 12.5 oz (100.6 kg) (01/24 0700) Last BM Date: 09/12/13 (flexiseal in place)  Intake/Output from previous day: 01/23 0701 - 01/24 0700 In: 3902.7 [P.O.:600; I.V.:522.7; IV Piggyback:250; TPN:2530] Out: 4070 [Urine:3990; Drains:30; Stool:50] Intake/Output this shift:    Abdomen soft, minimally tender Purulence around drains and necrotic midline wound with fascial dehiscence   Lab Results:   Recent Labs  09/12/13 0415  WBC 15.8*  HGB 11.0*  HCT 33.4*  PLT 470*   BMET  Recent Labs  09/12/13 0415 09/13/13 0352  NA 150* 148*  K 3.1* 3.6*  CL 108 109  CO2 27 25  GLUCOSE 156* 149*  BUN 63* 58*  CREATININE 2.01* 1.81*  CALCIUM 7.7* 7.8*   PT/INR No results found for this basename: LABPROT, INR,  in the last 72 hours ABG No results found for this basename: PHART, PCO2, PO2, HCO3,  in the last 72 hours  Studies/Results: Dg Chest Port 1 View  09/12/2013   CLINICAL DATA:  Pleural effusions  EXAM: PORTABLE CHEST - 1 VIEW  COMPARISON:  September 10, 2013  FINDINGS: Central catheter tip is in the right atrium. No pneumothorax. There are minimal effusions bilaterally. There is patchy atelectasis in both lower lobes. There has been clearing of patchy consolidation from the right base. There is no new opacity. Heart is prominent with normal pulmonary vascularity. No adenopathy.  IMPRESSION: Patchy bibasilar atelectasis with minimal pleural effusions bilaterally. Partial clearing right base compared to 2 days prior. No new opacity. Stable cardiac  prominence. No pneumothorax.   Electronically Signed   By: Bretta Bang M.D.   On: 09/12/2013 07:24    Anti-infectives: Anti-infectives   Start     Dose/Rate Route Frequency Ordered Stop   09/10/13 2200  metroNIDAZOLE (FLAGYL) IVPB 500 mg     500 mg 100 mL/hr over 60 Minutes Intravenous 3 times per day 09/10/13 1802 09/24/13 1359   09/10/13 2000  vancomycin (VANCOCIN) 50 mg/mL oral solution 500 mg     500 mg Oral 4 times per day 09/10/13 1802 09/24/13 1159   09/10/13 1800  vancomycin (VANCOCIN) 50 mg/mL oral solution 500 mg  Status:  Discontinued     500 mg Oral 4 times per day 09/10/13 1432 09/10/13 1802   09/10/13 1600  metroNIDAZOLE (FLAGYL) IVPB 500 mg  Status:  Discontinued     500 mg 100 mL/hr over 60 Minutes Intravenous 3 times per day 09/10/13 1432 09/10/13 1802   09/09/13 1602  vancomycin (VANCOCIN) 1,250 mg in sodium chloride 0.9 % 250 mL IVPB  Status:  Discontinued     1,250 mg 166.7 mL/hr over 90 Minutes Intravenous Every 24 hours 09/09/13 1057 09/10/13 1432   09/06/13 1600  imipenem-cilastatin (PRIMAXIN) 500 mg in sodium chloride 0.9 % 100 mL IVPB  Status:  Discontinued     500 mg 200 mL/hr over 30 Minutes Intravenous 3 times per day 09/06/13 1310 09/12/13 1048   09/03/13 1100  imipenem-cilastatin (PRIMAXIN) 500 mg in sodium chloride 0.9 % 100  mL IVPB  Status:  Discontinued     500 mg 200 mL/hr over 30 Minutes Intravenous Every 6 hours 09/03/13 1007 09/06/13 1310   09/02/13 1300  vancomycin (VANCOCIN) IVPB 1000 mg/200 mL premix  Status:  Discontinued     1,000 mg 200 mL/hr over 60 Minutes Intravenous Every 12 hours 09/01/13 1527 09/07/13 1340   08/30/13 2000  vancomycin (VANCOCIN) IVPB 750 mg/150 ml premix  Status:  Discontinued     750 mg 150 mL/hr over 60 Minutes Intravenous Every 8 hours 08/30/13 1356 09/01/13 1527   08/29/13 2200  micafungin (MYCAMINE) 100 mg in sodium chloride 0.9 % 100 mL IVPB  Status:  Discontinued     100 mg 100 mL/hr over 1 Hours  Intravenous Every 24 hours 08/29/13 2036 09/10/13 1432   08/29/13 0400  vancomycin (VANCOCIN) IVPB 1000 mg/200 mL premix  Status:  Discontinued     1,000 mg 200 mL/hr over 60 Minutes Intravenous Every 8 hours 08/28/13 2358 08/30/13 1356   08/29/13 0400  piperacillin-tazobactam (ZOSYN) IVPB 3.375 g  Status:  Discontinued     3.375 g 12.5 mL/hr over 240 Minutes Intravenous 3 times per day 08/28/13 2358 09/03/13 1002   08/28/13 1845  [MAR Hold]  vancomycin (VANCOCIN) IVPB 1000 mg/200 mL premix     (On MAR Hold since 08/28/13 1957)   1,000 mg 200 mL/hr over 60 Minutes Intravenous  Once 08/28/13 1831 08/28/13 1955   08/28/13 1845  [MAR Hold]  piperacillin-tazobactam (ZOSYN) IVPB 3.375 g     (On MAR Hold since 08/28/13 1957)   3.375 g 100 mL/hr over 30 Minutes Intravenous  Once 08/28/13 1831 08/28/13 2056      Assessment/Plan: s/p Procedure(s): EXPLORATORY LAPAROTOMY,debridment of nacrotic stomach,and primary repair of perforated stomach. (N/A)  VAC stopped.  Will continue wet to dry dressings to midline.  Hopefully will not have to debride in the OR Continuing IV antibiotics. Would CT scan again if his WBC started to climb  LOS: 16 days    Lynell Greenhouse A 09/13/2013

## 2013-09-13 NOTE — Procedures (Signed)
Patient was given a duoneb treatment. Saliva was running out of his mouth down his chin. Patient followed directions as far as "take a deep breath" and "cough". Oxygen saturations were 93-83%. Non-rebreathing mask was placed on patient and sats increased to 97%. Suctioned patient through his left nares. There was no gag and the catheter went smoothly all the way in. Suctioned out moderate amount white thin frothy secretions. Returned patient to 50% venturi mask and will wean back to nasal cannula when tolerated.

## 2013-09-13 NOTE — Procedures (Signed)
Intubation Procedure Note James LeydenChristopher D Mccormick [id number] 12/17/1965  Procedure: Intubation Indications: Respiratory insufficiency  Procedure Details Consent: Unable to obtain consent because of emergent medical necessity. Time Out: Verified patient identification, verified procedure, site/side was marked, verified correct patient position, special equipment/implants available, medications/allergies/relevent history reviewed, required imaging and test results available.  Performed  Maximum sterile technique was used including gloves, gown and mask.  MAC and 4    Evaluation Hemodynamic Status: Transient hypotension treated with fluid; O2 sats: fell before Bag-Mask ventilation initiated, then recovered Patient's Current Condition: stable Complications: No apparent complications Patient did tolerate procedure well. Chest X-ray ordered to verify placement.  CXR: tube position acceptable.   James Mccormick S. 09/13/2013

## 2013-09-13 NOTE — Progress Notes (Signed)
Chaplain responded to code blue.  Chaplain inquired with nurse as to family presence, which he didn't think there was and the medical staff was able to stabilize the pt.  Chaplain currently has ongoing death in ED and assessed the situation enough to think that it was a good time to end the visit and return to the ED to provide support.

## 2013-09-13 NOTE — Progress Notes (Signed)
PARENTERAL NUTRITION CONSULT NOTE - FOLLOW UP  Pharmacy Consult for TPN Indication: Prolonged ileus  No Known Allergies  Patient Measurements: Height: '5\' 11"'  (180.3 cm) Weight: 221 lb 12.5 oz (100.6 kg) IBW/kg (Calculated) : 75.3 Vital Signs: Temp: 98.7 F (37.1 C) (01/24 0818) Temp src: Oral (01/24 0818) BP: 178/97 mmHg (01/24 0800) Pulse Rate: 117 (01/24 0800) Intake/Output from previous day: 01/23 0701 - 01/24 0700 In: 3902.7 [P.O.:600; I.V.:522.7; IV Piggyback:250; TPN:2530] Out: 4070 [RKYHC:6237; Drains:30; Stool:50] Intake/Output from this shift:    Labs:  Recent Labs  09/12/13 0415  WBC 15.8*  HGB 11.0*  HCT 33.4*  PLT 470*    Recent Labs  09/11/13 0407 09/12/13 0415 09/13/13 0352  NA 148* 150* 148*  K 3.4* 3.1* 3.6*  CL 103 108 109  CO2 '29 27 25  ' GLUCOSE 152* 156* 149*  BUN 63* 63* 58*  CREATININE 1.92* 2.01* 1.81*  CALCIUM 8.0* 7.7* 7.8*  MG 2.6*  --  2.6*  PHOS 4.6  --  5.6*  PROT 7.0  --   --   ALBUMIN 2.0*  --   --   AST 33  --   --   ALT 25  --   --   ALKPHOS 178*  --   --   BILITOT 0.8  --   --    Estimated Creatinine Clearance: 59.6 ml/min (by C-G formula based on Cr of 1.81).    Recent Labs  09/12/13 2004 09/12/13 2325 09/13/13 0347  GLUCAP 156* 115* 151*   Insulin Requirements in the past 24 hours:   24 units insulin total in TPN + 9 units from SSI since 1800 last night  Current Nutrition:  Clinimix E 5/15 at 128m/hr + lipid emulsion 20% at 165mhr  Nutritional Goals:  Goal 2250-2450 kcal and 150-160gm protein per RD 09/11/13  Assessment:  [age].o. male presented to ED 1/8 with abd pain. CT consistent with perforated stomach- ulcer due to NSAID use, pneumoperitoneum, peritonitis. Taken emergently to OR where he underwent exp lap, debridement of necrotic stomach, repair of perforated stomach. Contrast study showed no leak from stomach or small bowel.  Off Pivot TF since 1/19.  GI: Clear liquid diet started yesterday - some  abdominal pain with liquids; s/p FEES 1/23 which recommended thin liquids - diet not advanced further yet today  Endo: No h/o DM - CBGs are at goal on SSI + insulin in TPN  Lytes: Na 148, K 3.6 (received 2 runs per e-link), Phos 5.6, Mag 2.6, CorCa 9.4 (CoCa X phos = 52.64)  Renal: Scr 1.81, UOP 1.7 ml/kg/hr  Hepatobil: LFTs wnl with exception of elevated Alk Phos. Prealbumin 9.5, trigs 223 (from 1/19)  ID: CDiff PCR positive 1/21- IV Flagyl and PO vancomycin started - Afebrile, WBC 15.8 as of 1/23  Vanc 1/8>>1/21 Zosyn 1/8>>1/14 Micafungin 1/9>>1/21 Primaxin 1/14>>1/23 Vanc PO 1/21>> Flagyl 1/21>>  TPN Access: triple lumen PICC (1/20) TPN day#: 15  Plan:  1. Continue Clinimix E 5/15 at 10063mr and lipid emulsion 20% at 28m70m (provides 120gm protein/day - 80% goal + 2184 kcal/day - 97% goal) Unable to meet full nutrition goals due to limitations of pre-mix clinimix bags - continue electrolytes in TPN today but will need to monitor closely 2. Continue multivitamin and full trace elements in TPN 3. Continue 24 units insulin in TPN 4. Continue SSI and CBGs as ordered 5. F/u AM BMET, phos and Mg  6. F/u plans for advancing diet   RachSalome ArntarmD,  BCPS Pager # [phone] 09/13/2013 9:10 AM

## 2013-09-13 NOTE — Procedures (Signed)
Intubation Procedure Note James LeydenChristopher D Mccormick [id number] 04/10/1966  Procedure: Intubation Indications: Respiratory insufficiency  Procedure Details Consent: Unable to obtain consent because of emergent medical necessity. Time Out: Verified patient identification, verified procedure, site/side was marked, verified correct patient position, special equipment/implants available, medications/allergies/relevent history reviewed, required imaging and test results available.  Performed  Maximum sterile technique was used including gloves, gown and mask.  MAC and 4    Evaluation Hemodynamic Status: Transient hypotension treated with fluid; O2 sats: stable throughout Patient's Current Condition: stable Complications: No apparent complications Patient did tolerate procedure well. Chest X-ray ordered to verify placement.  CXR: pending.   James Mccormick S. 09/13/2013

## 2013-09-13 NOTE — Progress Notes (Signed)
MD Byrum at bedside to assess patient; noted to be somnolent.  ABG and CXR ordered and obtained.  ABG showed elevated CO2 and MD made aware.  While MD Byrum was on his way back to the bedside, patient's heart rate dropped rapidly and the patient became unresponsive.  Code Blue called (see code sheet for details), CPR performed until ROSC,  mg epi given, pt intubated.  Stat CXR obtained per protocol.  Will continue to monitor patient closely.

## 2013-09-13 NOTE — Progress Notes (Signed)
[facility] FOR AM LAB REPLACEMENT ONLY  The patient does apply for the [facility] Electrolyte [facility] Protocol based on the criteria listed below:   1. Is GFR >/= 40 ml/min? yes  Patient's GFR today is 42 2. Is urine output >/= 0.5 ml/kg/hr for the last 6 hours? yes Patient's UOP is 1.6 ml/kg/hr 3. Is BUN < 60 mg/dL? yes  Patient's BUN today is 58 4. Abnormal electrolyte(s): K 3.6 5. Ordered repletion with: protocol 6. If a panic level lab has been reported, has the CCM MD in charge been notified? no.   Physician:    Marlow BaarsJordana G Pilkington-Burchett 09/13/2013 5:46 AM

## 2013-09-13 NOTE — Progress Notes (Signed)
[place] [facility]     Name: James Mccormick MRN: [id number] DOB: 03/05/1966    ADMISSION DATE:  08/28/2013 CONSULTATION DATE:  08/28/13 LOS 16 days  REFERRING MD :  Andrey CampanileWilson PRIMARY SERVICE: [facility]  CHIEF COMPLAINT:  Abdominal Pain  BRIEF PATIENT DESCRIPTION:  48 yo male smoker presented with abdominal pain from perforated gastric ulcer in setting of NSAID.  PCCM consulted to assist with septic shock and respiratory failure management.  SIGNIFICANT EVENTS: 1/8: Exploratory laparotomy, debridement of necrotic stomach with scissors, primary repair of perforated stomach in 2 layers.  Septic shock, VDRF, a fib post-op. 1/9: Cardiology consulted . ECHO EF 45% 1/11: still pressor dependent, NSR for > 24 hrs. Amiodarone stopped.  1/12: remain son levophed,. Left radial a line dc'ed 09/02/13: off levophed. Encephalopathic - randomly agitated. Mmit 08/28/2013 . TPN continuesoving around, biting tube, looks randomly, does not track. Failed SBT. Febrile. + 24 Liters. Back on neo 09/03/13: ARDS Protocol with 48h nimbex started. Left radial artery occluded - seen by [facility]VS. Expectant mgmt. 1/15: 50% fio2 on ARDS protocol with nimbex. Left radial artery occlusion events noted. 1/17 off paralytics 1/19 Extubated 1/20 Precedex for agitation 1/20 Speech therapy swallow eval; d/c solu cortef 1/21 Diarrhea, increased WBC 1/22 Trial on BiPAP  STUDIES:  1/8 CT abd/pelvis >> pneumoperitoneum, Lt kidney stone, diverticulosis 1/9 Echo >> mild LVH, EF 40 to 45%, grade 1 diastolic dysfx 1/14 Lt upper ext arterial duplex >> no evidence for thrombosis in Lt arm 1/16 CT abd/pelvis >> b/l pleural effusions, areas of GGO and consolidation, no leak from stomach or small bowel  LINES / TUBES: 1/8 ETT >> 1/19 1/8 Rt IJ CVL >> 1/20 1/8 Lt radial aline >>>1/12 1/14 Rt radial aline>>1/19 1/20 Rt PICC >>  CULTURES: Blood 1/9 >> negative Tracheal aspirate 1/16 >> negative C diff 1/21 >>POS   ANTIBIOTICS: Vancomycin 1/8  >>>1/20 Zosyn 1/8 >>>>1/14 Micafungin 1/9 >>>1/21 Imipenem 1/14 >>> 1/23 Flagyl 1/21>>> Vancomycin (oral) 1/21>>>  SUBJECTIVE:  Needed BiPAP for a few hour overnight precedex off Had some Abd pain 1/23 pm after taking liquids Up to chair, talks but WEAK  VITAL SIGNS: Temp:  [97.4 F (36.3 C)-99.7 F (37.6 C)] 98.7 F (37.1 C) (01/24 0818) Pulse Rate:  [85-128] 109 (01/24 0700) Resp:  [17-33] 29 (01/24 0700) BP: (152-186)/(84-124) 155/87 mmHg (01/24 0700) SpO2:  [92 %-100 %] 95 % (01/24 0700) FiO2 (%):  [50 %-60 %] 60 % (01/24 0300) Weight:  [100.6 kg (221 lb 12.5 oz)] 100.6 kg (221 lb 12.5 oz) (01/24 0700) Room air  INTAKE / OUTPUT: Intake/Output     01/23 0701 - 01/24 0700 01/24 0701 - 01/25 0700   P.O. 600    I.V. (mL/kg) 522.7 (5.2)    IV Piggyback 250    TPN 2530    Total Intake(mL/kg) 3902.7 (38.8)    Urine (mL/kg/hr) 3990 (1.7)    Drains 30 (0)    Stool 50 (0)    Total Output 4070     Net -167.3            PHYSICAL EXAMINATION: General: no distress but globally weak Neuro: alert, mildly confused, follows simple commands HEENT: no sinus tenderness Cardiovascular: regular Lungs: no wheeze, decreased at bases, lots of UA secretions with difficulty clearing Abdomen: wound dressing w some purulence on the L, also coming out of his JP drains Musculoskeletal: ankle edema Skin: area of ecchymosis on Lt forearm  LABS: PULMONARY  Recent Labs Lab 09/07/13 0350 09/08/13 0414  09/08/13 2232 09/09/13 0536  PHART 7.362 7.480* 7.627* 7.581*  PCO2ART 51.7* 41.3 33.9* 41.1  PO2ART 66.0* 87.0 50.0* 87.0  HCO3 29.4* 30.9* 35.4* 38.6*  TCO2 31 32 36 40  O2SAT 92.0 97.0 92.0 98.0   CBC  Recent Labs Lab 09/08/13 0400 09/10/13 0435 09/12/13 0415  HGB 10.5* 10.8* 11.0*  HCT 31.2* 32.2* 33.4*  WBC 14.0* 20.2* 15.8*  PLT 337 438* 470*   CARDIAC   No results found for this basename: TROPONINI,  in the last 168 hours No results found for this basename:  PROBNP,  in the last 168 hours CHEMISTRY  Recent Labs Lab 09/07/13 0350 09/08/13 0400 09/09/13 0430 09/10/13 0435 09/11/13 0407 09/12/13 0415 09/13/13 0352  NA 137 138 142 145 148* 150* 148*  K 3.8 3.0* 2.8* 3.0* 3.4* 3.1* 3.6*  CL 95* 94* 92* 98 103 108 109  CO2 25 29 35* 32 29 27 25   GLUCOSE 174* 188* 109* 135* 152* 156* 149*  BUN 56* 74* 81* 76* 63* 63* 58*  CREATININE 2.17* 2.23* 2.14* 1.97* 1.92* 2.01* 1.81*  CALCIUM 7.8* 7.9* 8.1* 8.0* 8.0* 7.7* 7.8*  MG 2.2 2.0  --  2.6* 2.6*  --  2.6*  PHOS 5.6* 4.9* 4.7*  --  4.6  --  5.6*   Estimated Creatinine Clearance: 59.6 ml/min (by C-G formula based on Cr of 1.81).  LIVER  Recent Labs Lab 09/08/13 0400 09/11/13 0407  AST 32 33  ALT 19 25  ALKPHOS 134* 178*  BILITOT 1.0 0.8  PROT 6.5 7.0  ALBUMIN 1.5* 2.0*   ENDOCRINE CBG (last 3)   Recent Labs  09/12/13 2004 09/12/13 2325 09/13/13 0347  GLUCAP 156* 115* 151*   IMAGING x48h  Dg Chest Port 1 View  09/12/2013   CLINICAL DATA:  Pleural effusions  EXAM: PORTABLE CHEST - 1 VIEW  COMPARISON:  September 10, 2013  FINDINGS: Central catheter tip is in the right atrium. No pneumothorax. There are minimal effusions bilaterally. There is patchy atelectasis in both lower lobes. There has been clearing of patchy consolidation from the right base. There is no new opacity. Heart is prominent with normal pulmonary vascularity. No adenopathy.  IMPRESSION: Patchy bibasilar atelectasis with minimal pleural effusions bilaterally. Partial clearing right base compared to 2 days prior. No new opacity. Stable cardiac prominence. No pneumothorax.   Electronically Signed   By: Bretta Bang M.D.   On: 09/12/2013 07:24   ASSESSMENT / PLAN:  PULMONARY A:  Acute respiratory failure with ALI in setting of peritonitis >> much improved 1/23. Still with UA secretions 1/24, poor clearance B/L small pleural effusions with passive atelectasis > improved 1/24. Hx of smoking >> air trapping on  vent 1/10. P: -oxygen to keep SpO2 > 92% -f/u CXR -add NTS and flutter prn on 1/24 -add scheduled BD's on 1/24 -BiPAP prn  CARDIAC A:  Septic shock 2nd to peritonitis >> resolved. A fib with RVR >>resolved. Was on IV amiodarone 1/9 to 1/10. Acute systolic heart failure likely 2nd to sepsis. NSTEMI from demand ischemia. Hx of HTN with chronic diastolic dysfx. P:  -prn hydralazine, labetalol until he can take PO BP control -add scheduled oral meds once able to swallow pills -will need cardiac evaluation and f/u Echo at some point  RENAL A:   Acute kidney injury 2nd to hypovolemia, and ATN in setting of shock >> improving. Metabolic acidosis >> resolved. Hypernatremia 2nd to diuresis. Hypokalemia. Hypervolemia >> improving. P:   -f/u renal fx,  urine outpt -f/u and replace electrolytes as needed -KVO IV fluids -held lasix 1/23, will correct free water deficit through his TPN and PO water  GASTROINTESTINAL A:   Perforated gastric ulcer in setting of NSAID use as outpt. Protein calorie malnutrition. Dypshagia. P:   -Post -op care, wound care, drains per CCS -Protonix BID -TNA ordered -OK'd for clear diet  HEMATOLOGIC A:   Anemia of critical illness Low platelet 2nd to sepsis; HIT negative 1/13 >> resolved. P:  -f/u CBC intermittently -SQ lovenox for DVT prevention.  INFECTIOUS A:  Septic shock 2nd to peritonitis. Recurrent fever with possible HCAP 1/14. Diarrhea, increased WBC 1/21 2nd to C diff >> improving. P:   -D/c primaxin 1/23 -IV flagyl + po vancomycin   ENDOCRINE A:   Hyperglycemia. Relative adrenal insufficiency in setting of sepsis >> cortisol 14.7 from 1/17  >> resolved. P:   -SSI   NEUROLOGIC A:   Acute encephalopathy 2nd to septic shock >> improved. Agitated delirium after extubation 1/19 >> improved with precedex. Deconditioning. P:   -off precedex -prn fentanyl, ativan -PT evaluation  Summary: Still marginal secretion clearance  but much improved overall.  Abx for C diff only. Taking clears, still on TPN.  Adding flutter, scheduled BDs and NTS prn. Would leave in ICU while at risk for re-intubation and requiring BiPAP prn  Levy Pupa, MD, PhD 09/13/2013, 9:01 AM [facility] [phone] or if no answer [phone]

## 2013-09-13 NOTE — Progress Notes (Signed)
PCCM Significant Event Note/ CPR  Pt has had hypersomnolence and poor airway protection through the day, then evolved hypoxemia. FiO2 increased and then finally to BiPAP. ABG confirmed hypercapneic resp failure. Planned to electively intubate the patient, but before this could be done he progressively declined, suffered brady arrest and required Bag-Mask ventilation, epi x 1 and approx 45 sec of CPR. [facility] and then he was sedated and intubated. CXR is pending.   Levy Pupaobert Letcher Schweikert, MD, PhD 09/13/2013, 3:01 PM [facility] [phone] or if no answer [phone]

## 2013-09-13 NOTE — Progress Notes (Signed)
Pt complaining of being tired and having increased WOB.  Pt also noted to be drooling and states that he is unable to swallow well.  RR and HR elevated, sats only up to 86% on non-rebreather.  Pt placed 60% bipap.  MD Byrum made aware and will come see patient.  Diet cancelled for now.  Will continue to monitor.

## 2013-09-14 ENCOUNTER — Inpatient Hospital Stay (HOSPITAL_COMMUNITY): Payer: Medicaid Other

## 2013-09-14 LAB — GLUCOSE, CAPILLARY
Glucose-Capillary: 109 mg/dL — ABNORMAL HIGH (ref 70–99)
Glucose-Capillary: 112 mg/dL — ABNORMAL HIGH (ref 70–99)
Glucose-Capillary: 115 mg/dL — ABNORMAL HIGH (ref 70–99)
Glucose-Capillary: 115 mg/dL — ABNORMAL HIGH (ref 70–99)
Glucose-Capillary: 121 mg/dL — ABNORMAL HIGH (ref 70–99)
Glucose-Capillary: 123 mg/dL — ABNORMAL HIGH (ref 70–99)

## 2013-09-14 LAB — BASIC METABOLIC PANEL
BUN: 69 mg/dL — ABNORMAL HIGH (ref 6–23)
BUN: 61 mg/dL — ABNORMAL HIGH (ref 6–23)
CHLORIDE: 116 meq/L — AB (ref 96–112)
CO2: 23 meq/L (ref 19–32)
CO2: 24 mEq/L (ref 19–32)
CREATININE: 1.89 mg/dL — AB (ref 0.50–1.35)
Calcium: 7.7 mg/dL — ABNORMAL LOW (ref 8.4–10.5)
Calcium: 7.7 mg/dL — ABNORMAL LOW (ref 8.4–10.5)
Chloride: 116 mEq/L — ABNORMAL HIGH (ref 96–112)
Creatinine, Ser: 2.14 mg/dL — ABNORMAL HIGH (ref 0.50–1.35)
GFR calc Af Amer: 40 mL/min — ABNORMAL LOW (ref >90)
GFR calc non Af Amer: 35 mL/min — ABNORMAL LOW (ref >90)
GFR calc non Af Amer: 40 mL/min — ABNORMAL LOW (ref >90)
GFR, EST AFRICAN AMERICAN: 46 mL/min — AB (ref >90)
GLUCOSE: 137 mg/dL — AB (ref 70–99)
Glucose, Bld: 124 mg/dL — ABNORMAL HIGH (ref 70–99)
POTASSIUM: 4.3 meq/L (ref 3.7–5.3)
Potassium: 3.9 mEq/L (ref 3.7–5.3)
SODIUM: 152 meq/L — AB (ref 137–147)
SODIUM: 151 meq/L — AB (ref 137–147)

## 2013-09-14 LAB — PHOSPHORUS: PHOSPHORUS: 5.6 mg/dL — AB (ref 2.3–4.6)

## 2013-09-14 LAB — MAGNESIUM: MAGNESIUM: 2.8 mg/dL — AB (ref 1.5–2.5)

## 2013-09-14 MED ORDER — ACETAMINOPHEN 160 MG/5ML PO SOLN
650.0000 mg | Freq: Four times a day (QID) | ORAL | Status: DC | PRN
  Administered 2013-09-14 – 2013-09-15 (×2): 650 mg via ORAL
  Filled 2013-09-14 (×2): qty 20.3

## 2013-09-14 MED ORDER — TRACE MINERALS CR-CU-F-FE-I-MN-MO-SE-ZN IV SOLN
INTRAVENOUS | Status: AC
  Administered 2013-09-14: 18:00:00 via INTRAVENOUS
  Filled 2013-09-14: qty 3000

## 2013-09-14 MED ORDER — FAT EMULSION 20 % IV EMUL
240.0000 mL | INTRAVENOUS | Status: AC
  Administered 2013-09-14: 240 mL via INTRAVENOUS
  Filled 2013-09-14: qty 250

## 2013-09-14 MED ORDER — FREE WATER
200.0000 mL | Freq: Four times a day (QID) | Status: DC
  Administered 2013-09-14 – 2013-09-15 (×4): 200 mL

## 2013-09-14 NOTE — Progress Notes (Signed)
[facility]     Name: James Mccormick MRN: [id number] DOB: 1966/07/23    ADMISSION DATE:  08/28/2013 CONSULTATION DATE:  08/28/13 LOS 17 days  REFERRING MD :  Andrey Campanile PRIMARY SERVICE: [facility]  BRIEF PATIENT DESCRIPTION:  48 yo male smoker presented with abdominal pain from perforated gastric ulcer in setting of NSAID.  PCCM consulted to assist with septic shock and respiratory failure management.  Required reintubation 1/24.   SIGNIFICANT EVENTS: 1/8: Exploratory laparotomy, debridement of necrotic stomach with scissors, primary repair of perforated stomach in 2 layers.  Septic shock, VDRF, a fib post-op. 1/9: Cardiology consulted . ECHO EF 45% 1/11: still pressor dependent, NSR for > 24 hrs. Amiodarone stopped.  1/12: remain son levophed,. Left radial a line dc'ed 09/02/13: off levophed. Encephalopathic - randomly agitated. Mmit 08/28/2013 . TPN continuesoving around, biting tube, looks randomly, does not track. Failed SBT. Febrile. + 24 Liters. Back on neo 09/03/13: ARDS Protocol with 48h nimbex started. Left radial artery occluded - seen by [facility]. Expectant mgmt. 1/15: 50% fio2 on ARDS protocol with nimbex. Left radial artery occlusion events noted. 1/17 off paralytics 1/19 Extubated 1/20 Precedex for agitation 1/20 Speech therapy swallow eval; d/c solu cortef 1/21 Diarrhea, increased WBC 1/22 Trial on BiPAP 1/24 reintubated   STUDIES:  1/8 CT abd/pelvis >> pneumoperitoneum, Lt kidney stone, diverticulosis 1/9 Echo >> mild LVH, EF 40 to 45%, grade 1 diastolic dysfx 1/14 Lt upper ext arterial duplex >> no evidence for thrombosis in Lt arm 1/16 CT abd/pelvis >> b/l pleural effusions, areas of GGO and consolidation, no leak from stomach or small bowel  LINES / TUBES: 1/8 ETT >> 1/19 1/24 ETT >>  1/8 Rt IJ CVL >> 1/20 1/8 Lt radial aline >>>1/12 1/14 Rt radial aline>>1/19 1/20 Rt PICC >>  CULTURES: Blood 1/9 >> negative Tracheal aspirate 1/16 >> negative C diff 1/21 >>POS    ANTIBIOTICS: Vancomycin 1/8 >>>1/20 Zosyn 1/8 >>>>1/14 Micafungin 1/9 >>>1/21 Imipenem 1/14 >>> 1/23 Flagyl 1/21>>> Vancomycin (oral) 1/21>>>  SUBJECTIVE:  Progressive weakness, obtundation and ultimately resp failure 1/24 > required urgent intubation  VITAL SIGNS: Temp:  [98.6 F (37 C)-100.5 F (38.1 C)] 98.7 F (37.1 C) (01/25 0726) Pulse Rate:  [59-147] 120 (01/25 0700) Resp:  [14-36] 31 (01/25 0700) BP: (56-213)/(25-135) 134/101 mmHg (01/25 0700) SpO2:  [83 %-100 %] 100 % (01/25 0700) FiO2 (%):  [40 %-100 %] 40 % (01/25 0431) Room air  INTAKE / OUTPUT: Intake/Output     01/24 0701 - 01/25 0700 01/25 0701 - 01/26 0700   P.O.     I.V. (mL/kg) 491.7 (4.9)    Other 30    NG/GT 150    IV Piggyback 350    TPN 2640    Total Intake(mL/kg) 3661.7 (36.4)    Urine (mL/kg/hr) 2295 (1)    Emesis/NG output 50 (0)    Drains 21 (0)    Stool 100 (0)    Total Output 2466     Net +1195.7            PHYSICAL EXAMINATION: General: no distress, intubated Neuro: sleeping but wakes to voice, follows commands HEENT: ETT, PERRL Cardiovascular: regular Lungs: no wheeze, decreased at bases, strong cough Abdomen: wound dressing w some purulence, also coming out of his JP drains Musculoskeletal: ankle edema Skin: area of ecchymosis on Lt forearm  LABS: PULMONARY  Recent Labs Lab 09/08/13 0414 09/08/13 2232 09/09/13 0536  PHART 7.480* 7.627* 7.581*  PCO2ART 41.3 33.9* 41.1  PO2ART 87.0  50.0* 87.0  HCO3 30.9* 35.4* 38.6*  TCO2 32 36 40  O2SAT 97.0 92.0 98.0   CBC  Recent Labs Lab 09/08/13 0400 09/10/13 0435 09/12/13 0415  HGB 10.5* 10.8* 11.0*  HCT 31.2* 32.2* 33.4*  WBC 14.0* 20.2* 15.8*  PLT 337 438* 470*   CARDIAC   No results found for this basename: TROPONINI,  in the last 168 hours No results found for this basename: PROBNP,  in the last 168 hours CHEMISTRY  Recent Labs Lab 09/08/13 0400 09/09/13 0430 09/10/13 0435 09/11/13 0407 09/12/13 0415  09/13/13 0352 09/14/13 0400  NA 138 142 145 148* 150* 148* 152*  K 3.0* 2.8* 3.0* 3.4* 3.1* 3.6* 3.9  CL 94* 92* 98 103 108 109 116*  CO2 29 35* 32 29 27 25 23   GLUCOSE 188* 109* 135* 152* 156* 149* 137*  BUN 74* 81* 76* 63* 63* 58* 69*  CREATININE 2.23* 2.14* 1.97* 1.92* 2.01* 1.81* 2.14*  CALCIUM 7.9* 8.1* 8.0* 8.0* 7.7* 7.8* 7.7*  MG 2.0  --  2.6* 2.6*  --  2.6* 2.8*  PHOS 4.9* 4.7*  --  4.6  --  5.6* 5.6*   Estimated Creatinine Clearance: 50.4 ml/min (by C-G formula based on Cr of 2.14).  LIVER  Recent Labs Lab 09/08/13 0400 09/11/13 0407  AST 32 33  ALT 19 25  ALKPHOS 134* 178*  BILITOT 1.0 0.8  PROT 6.5 7.0  ALBUMIN 1.5* 2.0*   ENDOCRINE CBG (last 3)   Recent Labs  09/13/13 2342 09/14/13 0332 09/14/13 0731  GLUCAP 112* 123* 115*   IMAGING x48h  Dg Chest Port 1 View  09/13/2013   CLINICAL DATA:  Dyspnea.  Follow-up pulmonary infiltrates.  EXAM: PORTABLE CHEST - 1 VIEW  COMPARISON:  09/12/2013  FINDINGS: Mild bibasilar atelectasis or infiltrates show no significant change. Low lung volumes again noted. Heart size is stable. Right arm PICC line remains in appropriate position.  IMPRESSION: Mild bibasilar atelectasis or infiltrates, without significant change.   Electronically Signed   By: Myles Rosenthal M.D.   On: 09/13/2013 18:50   Dg Chest Port 1v Same Day  09/13/2013   CLINICAL DATA:  Post intubation.  EXAM: PORTABLE CHEST - 1 VIEW SAME DAY  COMPARISON:  09/13/2013 at 2 p.m.  FINDINGS: The patient is slightly rotated to the right. Right-sided PICC line is again seen as the tip has retracted several cm as the tip still lies over the region of the SVC although there is now a loop in the PICC line over the region of the subclavian vein. Endotracheal tube has been placed with tip approximately 1.6 cm above the carina along the right lateral tracheal wall.  Lungs are hypoinflated with new opacification over the right upper lung in mild elevation of the right hemidiaphragm.  There is mild cardiomegaly. Remainder the exam is unchanged.  IMPRESSION: Hypoinflation with new opacification over the right mid to upper lung likely atelectasis and possible pleural fluid.  Endotracheal tube with tip along the right lateral tracheal wall 1.6 cm above the carina.  Right-sided PICC line tip has retracted several cm as it still remains overlying the region of the SVC although there is now a new loop over the subclavian portion of the catheter.  These results were called by telephone at the time of interpretation on 09/13/2013 at 3:32 PM to Dr. Julio Sicks nurse, Deberah Pelton, who verbally acknowledged these results.   Electronically Signed   By: Elberta Fortis M.D.   On:  09/13/2013 15:32   ASSESSMENT / PLAN:  PULMONARY A:  Acute respiratory failure with ALI in setting of peritonitis >> required reintubation 1/24 due to secretion management and then hypercapneic failure B/L small pleural effusions with passive atelectasis  Hx of smoking >> air trapping on vent 1/10, none currently P: -continue MV and daily SBT's -f/u CXR -added scheduled BD's on 1/24 -diuresis as he can tolerate, currently hypernatremic so defer  CARDIAC A:  Septic shock 2nd to peritonitis >> resolved. A fib with RVR >>resolved. Was on IV amiodarone 1/9 to 1/10. Acute systolic heart failure likely 2nd to sepsis. NSTEMI from demand ischemia. Hx of HTN with chronic diastolic dysfx. P:  -prn hydralazine, labetalol until he can take PO BP control -will need cardiac evaluation and f/u Echo at some point  RENAL A:   Acute kidney injury 2nd to hypovolemia, and ATN in setting of shock >> improving. Metabolic acidosis >> resolved. Hypernatremia 2nd to diuresis. Hypokalemia. Hypervolemia >> improving. P:   -f/u renal fx, urine outpt -f/u and replace electrolytes as needed -KVO IV fluids -held lasix 1/23, will correct free water deficit through his TPN and free water (he was cleared for PO by [facility]CS  post-gastric repair)  GASTROINTESTINAL A:   Perforated gastric ulcer in setting of NSAID use as outpt. Protein calorie malnutrition. Dypshagia. P:   -Post -op care, wound care, drains per [facility] -Protonix BID -TNA ordered -OK'd for clear diet > ? Consider TF's. Will need to discuss w [facility]CS  HEMATOLOGIC A:   Anemia of critical illness Low platelet 2nd to sepsis; HIT negative 1/13 >> resolved. P:  -f/u CBC intermittently -SQ lovenox for DVT prevention.  INFECTIOUS A:  Septic shock 2nd to peritonitis. Recurrent fever with possible HCAP 1/14. Diarrhea, increased WBC 1/21 2nd to C diff >> improving. P:   -D/c primaxin 1/23 -IV flagyl + po vancomycin for C diff  ENDOCRINE A:   Hyperglycemia. Relative adrenal insufficiency in setting of sepsis >> cortisol 14.7 from 1/17  >> resolved. P:   -SSI   NEUROLOGIC A:   Acute encephalopathy 2nd to septic shock >> improved. Sedation Deconditioning. P:   -fentanyl + versed  40 minutes CC time   Levy Pupaobert Jacia Sickman, MD, PhD 09/14/2013, 8:41 AM  Pulmonary and Critical Care [phone] or if no answer [phone]

## 2013-09-14 NOTE — Progress Notes (Signed)
PARENTERAL NUTRITION CONSULT NOTE - FOLLOW UP  Pharmacy Consult for TPN Indication: Prolonged ileus  No Known Allergies  Patient Measurements: Height: '5\' 11"'  (180.3 cm) Weight: 221 lb 12.5 oz (100.6 kg) IBW/kg (Calculated) : 75.3 Vital Signs: Temp: 98.7 F (37.1 C) (01/25 0726) Temp src: Oral (01/25 0726) BP: 134/101 mmHg (01/25 0700) Pulse Rate: 120 (01/25 0700) Intake/Output from previous day: 01/24 0701 - 01/25 0700 In: 3661.7 [I.V.:491.7; NG/GT:150; IV Piggyback:350; TPN:2640] Out: 2466 [Urine:2295; Emesis/NG output:50; Drains:21; Stool:100] Intake/Output from this shift:    Labs:  Recent Labs  09/12/13 0415  WBC 15.8*  HGB 11.0*  HCT 33.4*  PLT 470*    Recent Labs  09/12/13 0415 09/13/13 0352 09/14/13 0400  NA 150* 148* 152*  K 3.1* 3.6* 3.9  CL 108 109 116*  CO2 '27 25 23  ' GLUCOSE 156* 149* 137*  BUN 63* 58* 69*  CREATININE 2.01* 1.81* 2.14*  CALCIUM 7.7* 7.8* 7.7*  MG  --  2.6* 2.8*  PHOS  --  5.6* 5.6*   Estimated Creatinine Clearance: 50.4 ml/min (by C-G formula based on Cr of 2.14).    Recent Labs  09/13/13 1927 09/13/13 2342 09/14/13 0332  GLUCAP 146* 112* 123*   Insulin Requirements in the past 24 hours:   24 units insulin total in TPN + 4 units from SSI since 1800 last night  Current Nutrition:  Clinimix E 5/15 at 159m/hr + lipid emulsion 20% at 133mhr  Nutritional Goals:  Goal 2250-2450 kcal and 150-160gm protein per RD 09/11/13  Assessment:  [age].o. male presented to ED 1/8 with abd pain. CT consistent with perforated stomach- ulcer due to NSAID use, pneumoperitoneum, peritonitis. Taken emergently to OR where he underwent exp lap, debridement of necrotic stomach, repair of perforated stomach. Contrast study showed no leak from stomach or small bowel.  Off Pivot TF since 1/19.  GI: NPO status resumed post-cardiac arrest - some abdominal pain with liquids when on a clear liquid diet   Endo: No h/o DM - CBGs are at goal on SSI +  insulin in TPN  Lytes: Na 152, K 3.8 (received 2 runs per e-link), Phos 5.6, Mag 2.8, CorCa 9.3 (CoCa X phos = 52.08)  Renal: Scr back up to 2.14, UOP 1 ml/kg/hr  Pulm: Required re-intubation last night d/t respiratory insufficiency and cardiac arrest - 100% 0.4 Fio2  Cards: S/p cardiac arrest with CPR last night until ROSC - BP 134/101, HR 120   Hepatobil: LFTs wnl with exception of elevated Alk Phos. Prealbumin 9.5, trigs 223 (from 1/19)  Neuro: Sedated on fent gtt - GCS 13, RASS 0  ID: CDiff PCR positive 1/21- IV Flagyl and PO vancomycin started - Afebrile, WBC 15.8 as of 1/23  Vanc 1/8>>1/21 Zosyn 1/8>>1/14 Micafungin 1/9>>1/21 Primaxin 1/14>>1/23 Vanc PO 1/21>> Flagyl 1/21>>  Best Practices: lovenox, PPI, MC  TPN Access: triple lumen PICC (1/20) TPN day#: 16  Plan:  1. Continue Clinimix 5/15 at 10033mr and lipid emulsion 20% at 50m16m (provides 120gm protein/day - 80% goal + 2184 kcal/day - 97% goal) Unable to meet full nutrition goals due to limitations of pre-mix clinimix bags - will remove electrolytes today in attempts to reduce sodium, Mg and Phos. However, pt is receiving very little sodium in the TPN so hypernatremia is likely related to other issues 2. Continue multivitamin and full trace elements in TPN 3. Continue 24 units insulin in TPN 4. Continue SSI and CBGs as ordered 5. F/u AM TPN labs  RachApolonio Schneiders  Ravynn Hogate, PharmD, BCPS Pager # [phone] 09/14/2013 7:53 AM

## 2013-09-14 NOTE — Progress Notes (Signed)
17 Days Post-Op  Subjective: Pt with resp code yesterday PM, required intubation and chest compressions.   Objective: Vital signs in last 24 hours: Temp:  [98.6 F (37 C)-100.5 F (38.1 C)] 98.7 F (37.1 C) (01/25 0726) Pulse Rate:  [59-147] 120 (01/25 0700) Resp:  [14-36] 31 (01/25 0700) BP: (56-213)/(25-135) 134/101 mmHg (01/25 0700) SpO2:  [83 %-100 %] 100 % (01/25 0700) FiO2 (%):  [40 %-100 %] 40 % (01/25 0431) Last BM Date: 09/12/13 (flexiseal in place)  Intake/Output from previous day: 01/24 0701 - 01/25 0700 In: 3661.7 [I.V.:491.7; NG/GT:150; IV Piggyback:350; TPN:2640] Out: 2466 [Urine:2295; Emesis/NG output:50; Drains:21; Stool:100] Intake/Output this shift:    General appearance: alert and cooperative Resp: coarse b/l GI: soft, non-tender; bowel sounds normal; no masses,  no organomegaly Incision/Wound: midline wound c/d/i, fascia appears intact, suture loose but holding  Lab Results:   Recent Labs  09/12/13 0415  WBC 15.8*  HGB 11.0*  HCT 33.4*  PLT 470*   BMET  Recent Labs  09/13/13 0352 09/14/13 0400  NA 148* 152*  K 3.6* 3.9  CL 109 116*  CO2 25 23  GLUCOSE 149* 137*  BUN 58* 69*  CREATININE 1.81* 2.14*  CALCIUM 7.8* 7.7*   PT/INR No results found for this basename: LABPROT, INR,  in the last 72 hours ABG No results found for this basename: PHART, PCO2, PO2, HCO3,  in the last 72 hours  Studies/Results: Dg Chest Port 1 View  09/13/2013   CLINICAL DATA:  Dyspnea.  Follow-up pulmonary infiltrates.  EXAM: PORTABLE CHEST - 1 VIEW  COMPARISON:  09/12/2013  FINDINGS: Mild bibasilar atelectasis or infiltrates show no significant change. Low lung volumes again noted. Heart size is stable. Right arm PICC line remains in appropriate position.  IMPRESSION: Mild bibasilar atelectasis or infiltrates, without significant change.   Electronically Signed   By: Myles RosenthalJohn  Stahl M.D.   On: 09/13/2013 18:50   Dg Chest Port 1v Same Day  09/13/2013   CLINICAL  DATA:  Post intubation.  EXAM: PORTABLE CHEST - 1 VIEW SAME DAY  COMPARISON:  09/13/2013 at 2 p.m.  FINDINGS: The patient is slightly rotated to the right. Right-sided PICC line is again seen as the tip has retracted several cm as the tip still lies over the region of the SVC although there is now a loop in the PICC line over the region of the subclavian vein. Endotracheal tube has been placed with tip approximately 1.6 cm above the carina along the right lateral tracheal wall.  Lungs are hypoinflated with new opacification over the right upper lung in mild elevation of the right hemidiaphragm. There is mild cardiomegaly. Remainder the exam is unchanged.  IMPRESSION: Hypoinflation with new opacification over the right mid to upper lung likely atelectasis and possible pleural fluid.  Endotracheal tube with tip along the right lateral tracheal wall 1.6 cm above the carina.  Right-sided PICC line tip has retracted several cm as it still remains overlying the region of the SVC although there is now a new loop over the subclavian portion of the catheter.  These results were called by telephone at the time of interpretation on 09/13/2013 at 3:32 PM to Dr. Julio SicksAMMY PARRETT's nurse, Deberah PeltonKari Stewart, who verbally acknowledged these results.   Electronically Signed   By: Elberta Fortisaniel  Boyle M.D.   On: 09/13/2013 15:32    Anti-infectives: Anti-infectives   Start     Dose/Rate Route Frequency Ordered Stop   09/10/13 2200  metroNIDAZOLE (FLAGYL) IVPB 500 mg  500 mg 100 mL/hr over 60 Minutes Intravenous 3 times per day 09/10/13 1802 09/24/13 1359   09/10/13 2000  vancomycin (VANCOCIN) 50 mg/mL oral solution 500 mg     500 mg Oral 4 times per day 09/10/13 1802 09/24/13 1159   09/10/13 1800  vancomycin (VANCOCIN) 50 mg/mL oral solution 500 mg  Status:  Discontinued     500 mg Oral 4 times per day 09/10/13 1432 09/10/13 1802   09/10/13 1600  metroNIDAZOLE (FLAGYL) IVPB 500 mg  Status:  Discontinued     500 mg 100 mL/hr over 60  Minutes Intravenous 3 times per day 09/10/13 1432 09/10/13 1802   09/09/13 1602  vancomycin (VANCOCIN) 1,250 mg in sodium chloride 0.9 % 250 mL IVPB  Status:  Discontinued     1,250 mg 166.7 mL/hr over 90 Minutes Intravenous Every 24 hours 09/09/13 1057 09/10/13 1432   09/06/13 1600  imipenem-cilastatin (PRIMAXIN) 500 mg in sodium chloride 0.9 % 100 mL IVPB  Status:  Discontinued     500 mg 200 mL/hr over 30 Minutes Intravenous 3 times per day 09/06/13 1310 09/12/13 1048   09/03/13 1100  imipenem-cilastatin (PRIMAXIN) 500 mg in sodium chloride 0.9 % 100 mL IVPB  Status:  Discontinued     500 mg 200 mL/hr over 30 Minutes Intravenous Every 6 hours 09/03/13 1007 09/06/13 1310   09/02/13 1300  vancomycin (VANCOCIN) IVPB 1000 mg/200 mL premix  Status:  Discontinued     1,000 mg 200 mL/hr over 60 Minutes Intravenous Every 12 hours 09/01/13 1527 09/07/13 1340   08/30/13 2000  vancomycin (VANCOCIN) IVPB 750 mg/150 ml premix  Status:  Discontinued     750 mg 150 mL/hr over 60 Minutes Intravenous Every 8 hours 08/30/13 1356 09/01/13 1527   08/29/13 2200  micafungin (MYCAMINE) 100 mg in sodium chloride 0.9 % 100 mL IVPB  Status:  Discontinued     100 mg 100 mL/hr over 1 Hours Intravenous Every 24 hours 08/29/13 2036 09/10/13 1432   08/29/13 0400  vancomycin (VANCOCIN) IVPB 1000 mg/200 mL premix  Status:  Discontinued     1,000 mg 200 mL/hr over 60 Minutes Intravenous Every 8 hours 08/28/13 2358 08/30/13 1356   08/29/13 0400  piperacillin-tazobactam (ZOSYN) IVPB 3.375 g  Status:  Discontinued     3.375 g 12.5 mL/hr over 240 Minutes Intravenous 3 times per day 08/28/13 2358 09/03/13 1002   08/28/13 1845  [MAR Hold]  vancomycin (VANCOCIN) IVPB 1000 mg/200 mL premix     (On MAR Hold since 08/28/13 1957)   1,000 mg 200 mL/hr over 60 Minutes Intravenous  Once 08/28/13 1831 08/28/13 1955   08/28/13 1845  [MAR Hold]  piperacillin-tazobactam (ZOSYN) IVPB 3.375 g     (On MAR Hold since 08/28/13 1957)   3.375  g 100 mL/hr over 30 Minutes Intravenous  Once 08/28/13 1831 08/28/13 2056      Assessment/Plan: s/p Procedure(s): EXPLORATORY LAPAROTOMY,debridment of nacrotic stomach,and primary repair of perforated stomach. (N/A) Continue wet to dry dressings to midline.  Hopefully will not have to debride in the OR Continuing IV antibiotics. Repeat CBC in AM. Would CT scan again if his WBC started to climb   LOS: 17 days    Marigene Ehlers., [facility] 09/14/2013

## 2013-09-15 ENCOUNTER — Inpatient Hospital Stay (HOSPITAL_COMMUNITY): Payer: Medicaid Other

## 2013-09-15 LAB — POCT I-STAT 3, ART BLOOD GAS (G3+)
ACID-BASE DEFICIT: 1 mmol/L (ref 0.0–2.0)
ACID-BASE DEFICIT: 2 mmol/L (ref 0.0–2.0)
ACID-BASE DEFICIT: 1 mmol/L (ref 0.0–2.0)
Bicarbonate: 28.3 mEq/L — ABNORMAL HIGH (ref 20.0–24.0)
Bicarbonate: 28.4 mEq/L — ABNORMAL HIGH (ref 20.0–24.0)
Bicarbonate: 26.2 mEq/L — ABNORMAL HIGH (ref 20.0–24.0)
O2 SAT: 92 %
O2 SAT: 100 %
O2 Saturation: 84 %
PCO2 ART: 55.3 mmHg — AB (ref 35.0–45.0)
PH ART: 7.222 — AB (ref 7.350–7.450)
Patient temperature: 98.7
Patient temperature: 98.7
TCO2: 31 mmol/L (ref 0–100)
TCO2: 31 mmol/L (ref 0–100)
TCO2: 28 mmol/L (ref 0–100)
pCO2 arterial: 69.2 mmHg (ref 35.0–45.0)
pCO2 arterial: 75.9 mmHg (ref 35.0–45.0)
pH, Arterial: 7.18 — CL (ref 7.350–7.450)
pH, Arterial: 7.284 — ABNORMAL LOW (ref 7.350–7.450)
pO2, Arterial: 60 mmHg — ABNORMAL LOW (ref 80.0–100.0)
pO2, Arterial: 84 mmHg (ref 80.0–100.0)
pO2, Arterial: 272 mmHg — ABNORMAL HIGH (ref 80.0–100.0)

## 2013-09-15 LAB — CBC
HEMATOCRIT: 33.7 % — AB (ref 39.0–52.0)
Hemoglobin: 10.5 g/dL — ABNORMAL LOW (ref 13.0–17.0)
MCH: 30.1 pg (ref 26.0–34.0)
MCHC: 31.2 g/dL (ref 30.0–36.0)
MCV: 96.6 fL (ref 78.0–100.0)
Platelets: 388 10*3/uL (ref 150–400)
RBC: 3.49 MIL/uL — ABNORMAL LOW (ref 4.22–5.81)
RDW: 15.3 % (ref 11.5–15.5)
WBC: 15 10*3/uL — ABNORMAL HIGH (ref 4.0–10.5)

## 2013-09-15 LAB — DIFFERENTIAL
BASOS ABS: 0 10*3/uL (ref 0.0–0.1)
Basophils Relative: 0 % (ref 0–1)
EOS ABS: 0.2 10*3/uL (ref 0.0–0.7)
Eosinophils Relative: 1 % (ref 0–5)
LYMPHS PCT: 9 % — AB (ref 12–46)
Lymphs Abs: 1.4 10*3/uL (ref 0.7–4.0)
Monocytes Absolute: 2 10*3/uL — ABNORMAL HIGH (ref 0.1–1.0)
Monocytes Relative: 13 % — ABNORMAL HIGH (ref 3–12)
NEUTROS ABS: 11.4 10*3/uL — AB (ref 1.7–7.7)
Neutrophils Relative %: 77 % (ref 43–77)

## 2013-09-15 LAB — GLUCOSE, CAPILLARY
Glucose-Capillary: 110 mg/dL — ABNORMAL HIGH (ref 70–99)
Glucose-Capillary: 120 mg/dL — ABNORMAL HIGH (ref 70–99)
Glucose-Capillary: 137 mg/dL — ABNORMAL HIGH (ref 70–99)

## 2013-09-15 LAB — COMPREHENSIVE METABOLIC PANEL
ALT: 10 U/L (ref 0–53)
AST: 12 U/L (ref 0–37)
Albumin: 1.8 g/dL — ABNORMAL LOW (ref 3.5–5.2)
Alkaline Phosphatase: 81 U/L (ref 39–117)
BILIRUBIN TOTAL: 0.4 mg/dL (ref 0.3–1.2)
BUN: 56 mg/dL — AB (ref 6–23)
CO2: 22 meq/L (ref 19–32)
CREATININE: 1.73 mg/dL — AB (ref 0.50–1.35)
Calcium: 7.6 mg/dL — ABNORMAL LOW (ref 8.4–10.5)
Chloride: 117 mEq/L — ABNORMAL HIGH (ref 96–112)
GFR calc Af Amer: 52 mL/min — ABNORMAL LOW (ref >90)
GFR calc non Af Amer: 45 mL/min — ABNORMAL LOW (ref >90)
Glucose, Bld: 128 mg/dL — ABNORMAL HIGH (ref 70–99)
Potassium: 4.1 mEq/L (ref 3.7–5.3)
Sodium: 153 mEq/L — ABNORMAL HIGH (ref 137–147)
Total Protein: 6.4 g/dL (ref 6.0–8.3)

## 2013-09-15 LAB — PHOSPHORUS: PHOSPHORUS: 3.7 mg/dL (ref 2.3–4.6)

## 2013-09-15 LAB — TRIGLYCERIDES: Triglycerides: 110 mg/dL (ref <150)

## 2013-09-15 LAB — PREALBUMIN: Prealbumin: 10.7 mg/dL — ABNORMAL LOW (ref 17.0–34.0)

## 2013-09-15 LAB — MAGNESIUM: MAGNESIUM: 2.3 mg/dL (ref 1.5–2.5)

## 2013-09-15 MED ORDER — FAT EMULSION 20 % IV EMUL
250.0000 mL | INTRAVENOUS | Status: AC
  Administered 2013-09-15: 250 mL via INTRAVENOUS
  Filled 2013-09-15: qty 250

## 2013-09-15 MED ORDER — TRACE MINERALS CR-CU-F-FE-I-MN-MO-SE-ZN IV SOLN
INTRAVENOUS | Status: AC
  Administered 2013-09-15: 17:00:00 via INTRAVENOUS
  Filled 2013-09-15: qty 2400

## 2013-09-15 NOTE — Progress Notes (Signed)
UR Completed.  Aracelia Brinson Jane [phone] 07/26/2014  

## 2013-09-15 NOTE — Progress Notes (Signed)
[facility]     Name: James Mccormick MRN: [id number] DOB: 1966/04/10    ADMISSION DATE:  08/28/2013 CONSULTATION DATE:  08/28/13 LOS 18 days  REFERRING MD :  Andrey Campanile PRIMARY SERVICE: [facility]  BRIEF PATIENT DESCRIPTION:  48 yo male smoker presented with abdominal pain from perforated gastric ulcer in setting of NSAID.  PCCM consulted to assist with septic shock and respiratory failure management.  Required reintubation 1/24.   SIGNIFICANT EVENTS: 1/8: Exploratory laparotomy, debridement of necrotic stomach with scissors, primary repair of perforated stomach in 2 layers.  Septic shock, VDRF, a fib post-op. 1/9: Cardiology consulted . ECHO EF 45% 1/11: still pressor dependent, NSR for > 24 hrs. Amiodarone stopped.  1/12: remain son levophed,. Left radial a line dc'ed 09/02/13: off levophed. Encephalopathic - randomly agitated. Mmit 08/28/2013 . TPN continuesoving around, biting tube, looks randomly, does not track. Failed SBT. Febrile. + 24 Liters. Back on neo 09/03/13: ARDS Protocol with 48h nimbex started. Left radial artery occluded - seen by VVS. Expectant mgmt. 1/15: 50% fio2 on ARDS protocol with nimbex. Left radial artery occlusion events noted. 1/17 off paralytics 1/19 Extubated 1/20 Precedex for agitation 1/20 Speech therapy swallow eval; d/c solu cortef 1/21 Diarrhea, increased WBC 1/22 Trial on BiPAP 1/24 reintubated   STUDIES:  1/8 CT abd/pelvis >> pneumoperitoneum, Lt kidney stone, diverticulosis 1/9 Echo >> mild LVH, EF 40 to 45%, grade 1 diastolic dysfx 1/14 Lt upper ext arterial duplex >> no evidence for thrombosis in Lt arm 1/16 CT abd/pelvis >> b/l pleural effusions, areas of GGO and consolidation, no leak from stomach or small bowel  LINES / TUBES: 1/8 ETT >> 1/19 1/24 ETT >>  1/8 Rt IJ CVL >> 1/20 1/8 Lt radial aline >>>1/12 1/14 Rt radial aline>>1/19 1/20 Rt PICC >>  CULTURES: Blood 1/9 >> negative Tracheal aspirate 1/16 >> negative C diff 1/21 >>POS    ANTIBIOTICS: Vancomycin 1/8 >>>1/20 Zosyn 1/8 >>>>1/14 Micafungin 1/9 >>>1/21 Imipenem 1/14 >>> 1/23 Flagyl 1/21>>> Vancomycin (oral) 1/21>>>  SUBJECTIVE:    VITAL SIGNS: Temp:  [98.9 F (37.2 C)-99.5 F (37.5 C)] 99.2 F (37.3 C) (01/26 0735) Pulse Rate:  [108-133] 130 (01/26 0800) Resp:  [17-29] 21 (01/26 0800) BP: (108-159)/(70-102) 159/101 mmHg (01/26 0800) SpO2:  [96 %-100 %] 98 % (01/26 0800) FiO2 (%):  [40 %] 40 % (01/26 0800) Weight:  [99.9 kg (220 lb 3.8 oz)] 99.9 kg (220 lb 3.8 oz) (01/26 0200) Room air  INTAKE / OUTPUT: Intake/Output     01/25 0701 - 01/26 0700 01/26 0701 - 01/27 0700   I.V. (mL/kg) 942.5 (9.4) 45 (0.5)   Other     NG/GT 410 30   IV Piggyback 300    TPN 2640 110   Total Intake(mL/kg) 4292.5 (43) 185 (1.9)   Urine (mL/kg/hr) 3775 (1.6)    Emesis/NG output 150 (0.1)    Drains 14 (0)    Stool     Total Output 3939     Net +353.5 +185          PHYSICAL EXAMINATION: General: no distress, intubated Neuro: sleeping but wakes to voice, follows commands HEENT: ETT, PERRL Cardiovascular: regular Lungs: no wheeze, decreased at bases, strong cough Abdomen: wound dressing w some purulence, also coming out of his JP drains Musculoskeletal: ankle edema Skin: area of ecchymosis on Lt forearm  LABS: PULMONARY  Recent Labs Lab 09/08/13 2232 09/09/13 0536  PHART 7.627* 7.581*  PCO2ART 33.9* 41.1  PO2ART 50.0* 87.0  HCO3 35.4* 38.6*  TCO2 36 40  O2SAT 92.0 98.0   CBC  Recent Labs Lab 09/10/13 0435 09/12/13 0415 09/15/13 0400  HGB 10.8* 11.0* 10.5*  HCT 32.2* 33.4* 33.7*  WBC 20.2* 15.8* 15.0*  PLT 438* 470* 388   CARDIAC   No results found for this basename: TROPONINI,  in the last 168 hours No results found for this basename: PROBNP,  in the last 168 hours CHEMISTRY  Recent Labs Lab 09/09/13 0430 09/10/13 0435 09/11/13 0407 09/12/13 0415 09/13/13 0352 09/14/13 0400 09/14/13 1830 09/15/13 0400  NA 142 145 148*  150* 148* 152* 151* 153*  K 2.8* 3.0* 3.4* 3.1* 3.6* 3.9 4.3 4.1  CL 92* 98 103 108 109 116* 116* 117*  CO2 35* 32 29 27 25 23 24 22   GLUCOSE 109* 135* 152* 156* 149* 137* 124* 128*  BUN 81* 76* 63* 63* 58* 69* 61* 56*  CREATININE 2.14* 1.97* 1.92* 2.01* 1.81* 2.14* 1.89* 1.73*  CALCIUM 8.1* 8.0* 8.0* 7.7* 7.8* 7.7* 7.7* 7.6*  MG  --  2.6* 2.6*  --  2.6* 2.8*  --  2.3  PHOS 4.7*  --  4.6  --  5.6* 5.6*  --  3.7   Estimated Creatinine Clearance: 62.2 ml/min (by C-G formula based on Cr of 1.73).  LIVER  Recent Labs Lab 09/11/13 0407 09/15/13 0400  AST 33 12  ALT 25 10  ALKPHOS 178* 81  BILITOT 0.8 0.4  PROT 7.0 6.4  ALBUMIN 2.0* 1.8*   ENDOCRINE CBG (last 3)   Recent Labs  09/14/13 1520 09/14/13 1923 09/14/13 2351  GLUCAP 109* 121* 137*   IMAGING x48h  Dg Chest Port 1 View  09/15/2013   CLINICAL DATA:  Acute respiratory failure.  EXAM: PORTABLE CHEST - 1 VIEW  COMPARISON:  September 14, 2013.  FINDINGS: Endotracheal and nasogastric tubes are unchanged in position. No pneumothorax or significant pleural effusion is noted. Minimal bilateral basilar opacity is noted most consistent with subsegmental atelectasis. Bony thorax is intact. Degenerative changes of lower thoracic spine are noted.  IMPRESSION: Minimal bilateral basilar opacities are noted most consistent with subsegmental atelectasis.   Electronically Signed   By: Roque Lias M.D.   On: 09/15/2013 08:31   Portable Chest Xray In Am  09/14/2013   CLINICAL DATA:  Acute respiratory failure. Septic shock. On ventilator.  EXAM: PORTABLE CHEST - 1 VIEW  COMPARISON:  09/13/2013  FINDINGS: Endotracheal tube is seen in appropriate position. Right arm PICC line is now in appropriate position, with tip overlying the superior cavoatrial junction. A new nasogastric tube is seen with tip in the stomach.  Decreased right upper lobe airspace disease in volume loss is seen since previous study. Decreased opacity also seen at left lung base  since prior exam. No new or worsening areas of pulmonary opacity seen. Heart size is normal.  IMPRESSION: Decrease in right upper lobe and left basilar infiltrates. Support lines and tubes in appropriate position.   Electronically Signed   By: Myles Rosenthal M.D.   On: 09/14/2013 14:25   Dg Chest Port 1 View  09/13/2013   CLINICAL DATA:  Dyspnea.  Follow-up pulmonary infiltrates.  EXAM: PORTABLE CHEST - 1 VIEW  COMPARISON:  09/12/2013  FINDINGS: Mild bibasilar atelectasis or infiltrates show no significant change. Low lung volumes again noted. Heart size is stable. Right arm PICC line remains in appropriate position.  IMPRESSION: Mild bibasilar atelectasis or infiltrates, without significant change.   Electronically Signed   By: Alver Sorrow.D.  On: 09/13/2013 18:50   Dg Chest Port 1v Same Day  09/13/2013   CLINICAL DATA:  Post intubation.  EXAM: PORTABLE CHEST - 1 VIEW SAME DAY  COMPARISON:  09/13/2013 at 2 p.m.  FINDINGS: The patient is slightly rotated to the right. Right-sided PICC line is again seen as the tip has retracted several cm as the tip still lies over the region of the SVC although there is now a loop in the PICC line over the region of the subclavian vein. Endotracheal tube has been placed with tip approximately 1.6 cm above the carina along the right lateral tracheal wall.  Lungs are hypoinflated with new opacification over the right upper lung in mild elevation of the right hemidiaphragm. There is mild cardiomegaly. Remainder the exam is unchanged.  IMPRESSION: Hypoinflation with new opacification over the right mid to upper lung likely atelectasis and possible pleural fluid.  Endotracheal tube with tip along the right lateral tracheal wall 1.6 cm above the carina.  Right-sided PICC line tip has retracted several cm as it still remains overlying the region of the SVC although there is now a new loop over the subclavian portion of the catheter.  These results were called by telephone at the  time of interpretation on 09/13/2013 at 3:32 PM to Dr. Julio SicksAMMY PARRETT's nurse, Deberah PeltonKari Stewart, who verbally acknowledged these results.   Electronically Signed   By: Elberta Fortisaniel  Boyle M.D.   On: 09/13/2013 15:32   ASSESSMENT / PLAN:  PULMONARY A:  Acute respiratory failure with ALI in setting of peritonitis >> required reintubation 1/24 due to secretion management and then hypercapneic failure B/L small pleural effusions with passive atelectasis  Hx of smoking >> air trapping on vent 1/10, none currently P: -looks acceptable for extubation 1/26 -f/u CXR -added scheduled BD's on 1/24 -diuresis as he can tolerate, currently hypernatremic so defer  CARDIAC A:  Septic shock 2nd to peritonitis >> resolved. A fib with RVR >>resolved. Was on IV amiodarone 1/9 to 1/10. Acute systolic heart failure likely 2nd to sepsis. NSTEMI from demand ischemia. Hx of HTN with chronic diastolic dysfx. P:  -prn hydralazine, labetalol until he can take PO BP control -will need cardiac evaluation and f/u Echo at some point  RENAL A:   Acute kidney injury 2nd to hypovolemia, and ATN in setting of shock >> improving. Metabolic acidosis >> resolved. Hypernatremia 2nd to diuresis. Hypokalemia. Hypervolemia >> improving. P:   -f/u renal fx, urine outpt -f/u and replace electrolytes as needed -KVO IV fluids -held lasix 1/23, will correct free water deficit through his TPN and free water (he was cleared for PO by [facility] post-gastric repair)  GASTROINTESTINAL A:   Perforated gastric ulcer in setting of NSAID use as outpt. Protein calorie malnutrition. Dypshagia. P:   -Post -op care, wound care, drains per [facility] -Protonix BID -TNA ordered -OK'd for clear diet.   HEMATOLOGIC A:   Anemia of critical illness Low platelet 2nd to sepsis; HIT negative 1/13 >> resolved. P:  -f/u CBC intermittently -SQ lovenox for DVT prevention.  INFECTIOUS A:  Septic shock 2nd to peritonitis. Recurrent fever with possible  HCAP 1/14.  Diarrhea, increased WBC 1/21 2nd to C diff >> improving. P:   -D/c'd primaxin 1/23 -IV flagyl + po vancomycin for C diff -following WBC -no clear indication for repeat CT scan abd at this time  ENDOCRINE A:   Hyperglycemia. Relative adrenal insufficiency in setting of sepsis >> cortisol 14.7 from 1/17  >> resolved. P:   -  SSI   NEUROLOGIC A:   Acute encephalopathy 2nd to septic shock >> improved. Sedation Deconditioning. P:   -fentanyl + versed  40 minutes CC time   Levy Pupa, MD, PhD 09/15/2013, 8:38 AM  Pulmonary and Critical Care [phone] or if no answer [phone]

## 2013-09-15 NOTE — Progress Notes (Signed)
210ml fentanyl wasted, witnessed by a second nurse, Harlow AsaLisa Friesen, RN. Washed down the sink

## 2013-09-15 NOTE — Progress Notes (Signed)
NUTRITION FOLLOW UP  Intervention:    Advance diet back to Clear Liquids per Surgery  TPN per pharmacy  RD to follow for nutrition care plan  Nutrition Dx:   Inadequate oral intake related to inability to eat, s/p extubation as evidenced by NPO status, ongoing  Goal:   Pt to meet >/= 90% of their estimated nutrition needs, progressing  Monitor:   TPN prescription, PO diet advancement, weight, labs, I/O's  Assessment:   Patient with PMH of GERD, HTN presented with severe abdominal pain associated with nausea & vomiting; had been taking Alleve and large amount of Goody's powder.   CT scan revealed perforated stomach ulcer due to NSAID use.   Patient s/p procedures 1/8:  EXPLORATORY LAPAROTOMY  DEBRIDEMENT OF NECROTIC STOMACH  REPAIR OF PERFORATED STOMACH  Patient tested positive for C difficile 1/21.  IV Flagyl & PO Vancomycin added.  Patient s/p FEES 1/23.  Demonstrated mild dysphagia.  Advanced to Clear Liquids then back to NPO s/p CPR event and re-intubation 1/24.  Re-extubated this AM.  Per Surgery can advance back to Clears today.  Patient receiving TPN with Clinimix E 5/15 @ 100 ml/hr and lipids @ 10 ml/hr. Provides 2184 kcal, 120 gm protein daily. Meets 99% minimum estimated energy needs and 80% minimum estimated protein needs.  Height: 08/28/13   5\' 11"  (1.803 m)    Weight Status ---> trending down, + diuresis Wt Readings from Last 1 Encounters:  09/15/13 220 lb 3.8 oz (99.9 kg)    01/24 221 lb 01/23 222 lb 01/22 230 lb 01/21 233 lb 01/20 252 lb 01/19 261 lb 01/18 276 lb 01/17 278 lb 01/16 279 lb 01/12 262 lb  Admission weight: 225 lb  Re-estimated needs:  Kcal: 2250-2450 Protein: 150-160 gm Fluid: per MD  Skin: abdominal surgical incision   Diet Order: NPO   Intake/Output Summary (Last 24 hours) at 09/15/13 1053 Last data filed at 09/15/13 1000  Gross per 24 hour  Intake 4285.63 ml  Output   4114 ml  Net 171.63 ml    Labs:   Recent  Labs Lab 09/13/13 0352 09/14/13 0400 09/14/13 1830 09/15/13 0400  NA 148* 152* 151* 153*  K 3.6* 3.9 4.3 4.1  CL 109 116* 116* 117*  CO2 25 23 24 22   BUN 58* 69* 61* 56*  CREATININE 1.81* 2.14* 1.89* 1.73*  CALCIUM 7.8* 7.7* 7.7* 7.6*  MG 2.6* 2.8*  --  2.3  PHOS 5.6* 5.6*  --  3.7  GLUCOSE 149* 137* 124* 128*    CBG (last 3)   Recent Labs  09/14/13 1520 09/14/13 1923 09/14/13 2351  GLUCAP 109* 121* 137*    Scheduled Meds: . antiseptic oral rinse  15 mL Mouth Rinse QID  . chlorhexidine  15 mL Mouth Rinse BID  . enoxaparin (LOVENOX) injection  40 mg Subcutaneous Q24H  . free water  200 mL Per Tube Q6H  . insulin aspart  0-15 Units Subcutaneous Q4H  . ipratropium-albuterol  3 mL Nebulization QID  . vancomycin  500 mg Oral Q6H   And  . metronidazole  500 mg Intravenous Q8H  . pantoprazole (PROTONIX) IV  40 mg Intravenous Q12H  . sodium chloride  10-40 mL Intracatheter Q12H    Continuous Infusions: . sodium chloride 20 mL (09/13/13 1409)  . sodium chloride 20 mL/hr at 09/12/13 0800  . TPN (CLINIMIX) Adult without lytes 100 mL/hr at 09/14/13 1759   And  . fat emulsion 240 mL (09/14/13 1758)  .  TPN (CLINIMIX) Adult without lytes     And  . fat emulsion    . fentaNYL infusion INTRAVENOUS Stopped (09/15/13 0845)    Maureen Chatters, RD, LDN Pager #: [phone] After-Hours Pager #: [phone]

## 2013-09-15 NOTE — Procedures (Signed)
**Note De-Identified Alani Sabbagh Obfuscation** Extubation Procedure Note  Patient Details:   Name: James Mccormick DOB: 10/11/1965 MRN: [id number]   Airway Documentation:  Airway 8 mm (Active)  Secured at (cm) 25 cm 09/15/2013  7:56 AM  Measured From Lips 09/15/2013  7:56 AM  Secured Location Center 09/14/2013  3:39 PM  Secured By [employer] Tube Holder 09/15/2013  7:56 AM  Tube Holder Repositioned Yes 09/15/2013  7:56 AM  Cuff Pressure (cm H2O) 23 cm H2O 09/14/2013  9:00 AM  Site Condition Edema 09/14/2013  3:39 PM    Evaluation  O2 sats: stable throughout Complications: No apparent complications Patient did tolerate procedure well. Bilateral Breath Sounds: Clear;Diminished Suctioning: Airway Yes +leak, no stridor noted Purl Claytor, Megan SalonWendy Cooper 09/15/2013, 9:42 AM

## 2013-09-15 NOTE — Consult Note (Signed)
WOC reviewed events from the weekend, NPWT VAC dressing discontinued from abdominal wound Friday and now continues to be off with saline dressings and possible need for debridement by surgical team. WOC will sign off at this time for that reason.    Re consult if needed, will not follow at this time. Thanks  Stevie [employer]ocker[employer] RN, CWOCN [phone])

## 2013-09-15 NOTE — Progress Notes (Signed)
Orthopedic Tech Progress Note Patient Details:  James LeydenChristopher D Mccormick 04/11/1966 [id number] Abdominal binder delivered to nurse Ortho Devices Type of Ortho Device: Abdominal binder Ortho Device/Splint Interventions: Ordered   [place] R Thompson 09/15/2013, 12:37 PM

## 2013-09-15 NOTE — Progress Notes (Signed)
SLP Cancellation Note  Patient Details Name: Pamalee LeydenChristopher D Neyland MRN: [id number] DOB: 07/18/1966   Cancelled treatment:        Notes and events reviewed since last seen by SLP.  Patient had abdominal pain after resuming po clear liquids on 1/23, and had increased WOB, was drooling, and c/o difficulty swallowing per RN notes on 1/24.  Pt. Was changed to NPO.  Patient was coded and required reintubation on 1/24 and was just extubated this am.  Will await new orders for repeat swallow evaluation in light of the above, prior to resuming po's.   Maryjo RochesterWillis, Laveyah Oriol T 09/15/2013, 1:46 PM

## 2013-09-15 NOTE — Progress Notes (Signed)
PARENTERAL NUTRITION CONSULT NOTE - FOLLOW UP  Pharmacy Consult for TPN Indication: Prolonged ileus  No Known Allergies  Patient Measurements: Height: 5\' 11"  (180.3 cm) Weight: 220 lb 3.8 oz (99.9 kg) IBW/kg (Calculated) : 75.3 Vital Signs: Temp: 99.2 F (37.3 C) (01/26 0735) Temp src: Oral (01/26 0735) BP: 159/101 mmHg (01/26 0800) Pulse Rate: 130 (01/26 0800) Intake/Output from previous day: 01/25 0701 - 01/26 0700 In: 4292.5 [I.V.:942.5; NG/GT:410; IV Piggyback:300; TPN:2640] Out: 3939 [Urine:3775; Emesis/NG output:150; Drains:14] Intake/Output from this shift: Total I/O In: 185 [I.V.:45; NG/GT:30; TPN:110] Out: -   Labs:  Recent Labs  09/15/13 0400  WBC 15.0*  HGB 10.5*  HCT 33.7*  PLT 388    Recent Labs  09/13/13 0352 09/14/13 0400 09/14/13 1830 09/15/13 0400  NA 148* 152* 151* 153*  K 3.6* 3.9 4.3 4.1  CL 109 116* 116* 117*  CO2 25 23 24 22   GLUCOSE 149* 137* 124* 128*  BUN 58* 69* 61* 56*  CREATININE 1.81* 2.14* 1.89* 1.73*  CALCIUM 7.8* 7.7* 7.7* 7.6*  MG 2.6* 2.8*  --  2.3  PHOS 5.6* 5.6*  --  3.7  PROT  --   --   --  6.4  ALBUMIN  --   --   --  1.8*  AST  --   --   --  12  ALT  --   --   --  10  ALKPHOS  --   --   --  81  BILITOT  --   --   --  0.4  TRIG  --   --   --  110   Estimated Creatinine Clearance: 62.2 ml/min (by C-G formula based on Cr of 1.73).    Recent Labs  09/14/13 1520 09/14/13 1923 09/14/13 2351  GLUCAP 109* 121* 137*   Insulin Requirements in the past 24 hours:   24 units insulin in TPN + 16 units from SSI  Current Nutrition:  Clinimix E 5/15 at 15600mL/hr + lipid emulsion 20% at 5610mL/hr  Nutritional Goals:  Goal 2250-2450 kcal and 150-160gm protein per RD 09/11/13  Assessment:  48 y.o. male presented to ED 1/8 with abd pain. CT consistent with perforated stomach- ulcer due to NSAID use, pneumoperitoneum, peritonitis. Taken emergently to OR where he underwent exp lap, debridement of necrotic stomach, repair of  perforated stomach. Contrast study showed no leak from stomach or small bowel.  Off Pivot TF since 1/19.  GI: NPO status resumed post-cardiac arrest - some abdominal pain with liquids when on a clear liquid diet.  Endo: No h/o DM. CBGs are at goal on SSI + insulin in TPN  Lytes: Na 153, Corr Ca 9.6, other lytes wnl.  Renal: Scr 1.73- trending down. UOP 1.7 ml/kg/hr. I/O ~ equal.  Pulm: Re-intubated 1/24 d/t respiratory insufficiency and cardiac arrest. Fio2 40%  Cards: S/p cardiac arrest with CPR 1/24 until ROSC. BP elevated, HR 120s. Prn metoprolol, labetalol, hydrazaline.  Hepatobil: LFTs wnl. Prealbumin 9.5, trigs down to 110.  Neuro: Sedated on fent gtt. GCS 13, RASS 0  ID: CDiff PCR positive 1/21- IV Flagyl and PO vancomycin. Afeb. WBC 15.  Vanc 1/8>>1/21 Zosyn 1/8>>1/14 Micafungin 1/9>>1/21 Primaxin 1/14>>1/23 Vanc PO 1/21>> Flagyl 1/21>>  Best Practices: lovenox, PPI, MC  TPN Access: triple lumen PICC (1/20)  TPN day#: 17  Plan:  1. Continue Clinimix 5/15 at 14900mL/hr and lipid emulsion 20% at 6710mL/hr (provides 120gm protein/day - 80% goal + 2184 kcal/day - 97% goal). Unable to  meet full nutrition goals due to limitations of pre-mix clinimix bags - will continue with no electrolytes today. Pt is receiving very little sodium in the TPN so hypernatremia is likely related to other issues. 2. Continue multivitamin and full trace elements in TPN 3. Continue 24 units insulin in TPN 4. Continue SSI and CBGs as ordered 5. F/u labs in a.m. and prealbumin  Christoper Fabian, PharmD, BCPS Clinical pharmacist, pager [phone]  09/15/2013 8:27 AM

## 2013-09-15 NOTE — Progress Notes (Signed)
Physical Therapy Treatment Patient Details Name: James LeydenChristopher D Mccormick MRN: [id number] DOB: 06/01/1966 Today's Date: 09/15/2013 Time: 1191-47821323-1350 PT Time Calculation (min): 27 min  PT Assessment / Plan / Recommendation  History of Present Illness 48 yo male smoker presented with abdominal pain from perforated gastric ulcer in setting of NSAID.  PCCM consulted to assist with septic shock and respiratory failure management.  Reintubated  1/24 and reextubated 1/26.   PT Comments   Pt mobilizing well, but limited by rising tachy HR through the 140's to max of 171 bpm.  Sats maintained in lower to mid 90's on 2L [place].  Follow Up Recommendations  CIR     Does the patient have the potential to tolerate intense rehabilitation     Barriers to Discharge        Equipment Recommendations  Other (comment) (TBA)    Recommendations for Other Services Rehab consult  Frequency Min 3X/week   Progress towards PT Goals Progress towards PT goals: Progressing toward goals  Plan Current plan remains appropriate    Precautions / Restrictions Precautions Precautions: Fall Precaution Comments: tachy   Pertinent Vitals/Pain See above    Mobility  Bed Mobility Overal bed mobility: Needs Assistance Bed Mobility: Supine to Sit Sidelying to sit: Min assist General bed mobility comments: min truncal assist Transfers Overall transfer level: Needs assistance Transfers: Sit to/from Stand Sit to Stand: Mod assist;+2 safety/equipment Stand pivot transfers: +2 safety/equipment;Min assist General transfer comment: assist to come forward Ambulation/Gait Ambulation/Gait assistance: Min guard Ambulation Distance (Feet): 50 Feet Assistive device:  (pushed a w/c) Gait Pattern/deviations: Step-through pattern Gait velocity: slow General Gait Details: Gait WFL limited by HR rising through 140's to max of 171 before able to get back to chair to sit down.    Exercises     PT Diagnosis:    PT Problem List:   PT  Treatment Interventions:     PT Goals (current goals can now be found in the care plan section) Acute Rehab PT Goals Patient Stated Goal: Need to get back to work PT Goal Formulation: With patient Time For Goal Achievement: 09/25/13 Potential to Achieve Goals: Good  Visit Information  Last PT Received On: 09/15/13 Assistance Needed: +2 (For lines) History of Present Illness: 48 yo male smoker presented with abdominal pain from perforated gastric ulcer in setting of NSAID.  PCCM consulted to assist with septic shock and respiratory failure management.  Reintubated  1/24 and reextubated 1/26.    Subjective Data  Subjective: I feel pretty good....no I don't feel my heart racing Patient Stated Goal: Need to get back to work   Cognition  Cognition Arousal/Alertness: Awake/alert Behavior During Therapy: WFL for tasks assessed/performed Overall Cognitive Status: Within Functional Limits for tasks assessed    Balance  Balance Overall balance assessment: Needs assistance Sitting-balance support: No upper extremity supported;Feet supported Sitting balance-Leahy Scale: Good Standing balance support: Bilateral upper extremity supported;During functional activity Standing balance-Leahy Scale: Good  End of Session PT - End of Session Equipment Utilized During Treatment: Oxygen Activity Tolerance: Patient tolerated treatment well;Other (comment) (but Tachy HR limited what could be done) Patient left: in chair;with call bell/phone within reach;with nursing/sitter in room Nurse Communication: Mobility status   GP     James Mccormick, James Mccormick 09/15/2013, 2:47 PM 09/15/2013  [place] Bing[place] James Mccormick, PT [phone]  (pager)

## 2013-09-15 NOTE — Progress Notes (Signed)
[facility]/Torry Adamczak Progress Note 18 Days Post-Op  Subjective: Patient awake and alert on the ventilator.  About to be extubated again.  Very appropriate.  Objective: Vital signs in last 24 hours: Temp:  [98.9 F (37.2 C)-99.5 F (37.5 C)] 99.2 F (37.3 C) (01/26 0735) Pulse Rate:  [108-133] 130 (01/26 0800) Resp:  [17-29] 21 (01/26 0800) BP: (108-159)/(70-102) 159/101 mmHg (01/26 0800) SpO2:  [96 %-100 %] 98 % (01/26 0800) FiO2 (%):  [40 %] 40 % (01/26 0800) Weight:  [99.9 kg (220 lb 3.8 oz)] 99.9 kg (220 lb 3.8 oz) (01/26 0200) Last BM Date: 09/12/13 (flexiseal in place)  Intake/Output from previous day: 01/25 0701 - 01/26 0700 In: 4292.5 [I.V.:942.5; NG/GT:410; IV Piggyback:300; TPN:2640] Out: 3939 [Urine:3775; Emesis/NG output:150; Drains:14] Intake/Output this shift: Total I/O In: 185 [I.V.:45; NG/GT:30; TPN:110] Out: -   General: No acute distress.  Cooperative.  Lungs: Clear to auscultation.  Sats are good on FIO2 40%  Abd: Soft.  Midline fascia has necrosed with partial dehiscence, not evisceration.  No wound abscess.  No need to take back to the OR as of yet. Has a small amount of pus draining from both Blake drains, significant amounts around Holton drains.  Extremities: No changes  Neuro: Intact   Lab Results:  @LABLAST2 (wbc:2,hgb:2,hct:2,plt:2) BMET  Recent Labs  09/14/13 1830 09/15/13 0400  NA 151* 153*  K 4.3 4.1  CL 116* 117*  CO2 24 22  GLUCOSE 124* 128*  BUN 61* 56*  CREATININE 1.89* 1.73*  CALCIUM 7.7* 7.6*   PT/INR No results found for this basename: LABPROT, INR,  in the last 72 hours ABG No results found for this basename: PHART, PCO2, PO2, HCO3,  in the last 72 hours  Studies/Results: Dg Chest Port 1 View  09/15/2013   CLINICAL DATA:  Acute respiratory failure.  EXAM: PORTABLE CHEST - 1 VIEW  COMPARISON:  September 14, 2013.  FINDINGS: Endotracheal and nasogastric tubes are unchanged in position. No pneumothorax or significant pleural effusion  is noted. Minimal bilateral basilar opacity is noted most consistent with subsegmental atelectasis. Bony thorax is intact. Degenerative changes of lower thoracic spine are noted.  IMPRESSION: Minimal bilateral basilar opacities are noted most consistent with subsegmental atelectasis.   Electronically Signed   By: Roque Lias M.D.   On: 09/15/2013 08:31   Portable Chest Xray In Am  09/14/2013   CLINICAL DATA:  Acute respiratory failure. Septic shock. On ventilator.  EXAM: PORTABLE CHEST - 1 VIEW  COMPARISON:  09/13/2013  FINDINGS: Endotracheal tube is seen in appropriate position. Right arm PICC line is now in appropriate position, with tip overlying the superior cavoatrial junction. A new nasogastric tube is seen with tip in the stomach.  Decreased right upper lobe airspace disease in volume loss is seen since previous study. Decreased opacity also seen at left lung base since prior exam. No new or worsening areas of pulmonary opacity seen. Heart size is normal.  IMPRESSION: Decrease in right upper lobe and left basilar infiltrates. Support lines and tubes in appropriate position.   Electronically Signed   By: Myles Rosenthal M.D.   On: 09/14/2013 14:25   Dg Chest Port 1 View  09/13/2013   CLINICAL DATA:  Dyspnea.  Follow-up pulmonary infiltrates.  EXAM: PORTABLE CHEST - 1 VIEW  COMPARISON:  09/12/2013  FINDINGS: Mild bibasilar atelectasis or infiltrates show no significant change. Low lung volumes again noted. Heart size is stable. Right arm PICC line remains in appropriate position.  IMPRESSION: Mild bibasilar atelectasis  or infiltrates, without significant change.   Electronically Signed   By: Myles RosenthalJohn  Stahl M.D.   On: 09/13/2013 18:50   Dg Chest Port 1v Same Day  09/13/2013   CLINICAL DATA:  Post intubation.  EXAM: PORTABLE CHEST - 1 VIEW SAME DAY  COMPARISON:  09/13/2013 at 2 p.m.  FINDINGS: The patient is slightly rotated to the right. Right-sided PICC line is again seen as the tip has retracted several cm  as the tip still lies over the region of the SVC although there is now a loop in the PICC line over the region of the subclavian vein. Endotracheal tube has been placed with tip approximately 1.6 cm above the carina along the right lateral tracheal wall.  Lungs are hypoinflated with new opacification over the right upper lung in mild elevation of the right hemidiaphragm. There is mild cardiomegaly. Remainder the exam is unchanged.  IMPRESSION: Hypoinflation with new opacification over the right mid to upper lung likely atelectasis and possible pleural fluid.  Endotracheal tube with tip along the right lateral tracheal wall 1.6 cm above the carina.  Right-sided PICC line tip has retracted several cm as it still remains overlying the region of the SVC although there is now a new loop over the subclavian portion of the catheter.  These results were called by telephone at the time of interpretation on 09/13/2013 at 3:32 PM to Dr. Julio SicksAMMY PARRETT's nurse, Deberah PeltonKari Stewart, who verbally acknowledged these results.   Electronically Signed   By: Elberta Fortisaniel  Boyle M.D.   On: 09/13/2013 15:32    Anti-infectives: Anti-infectives   Start     Dose/Rate Route Frequency Ordered Stop   09/10/13 2200  metroNIDAZOLE (FLAGYL) IVPB 500 mg     500 mg 100 mL/hr over 60 Minutes Intravenous 3 times per day 09/10/13 1802 09/24/13 1359   09/10/13 2000  vancomycin (VANCOCIN) 50 mg/mL oral solution 500 mg     500 mg Oral 4 times per day 09/10/13 1802 09/24/13 1159   09/10/13 1800  vancomycin (VANCOCIN) 50 mg/mL oral solution 500 mg  Status:  Discontinued     500 mg Oral 4 times per day 09/10/13 1432 09/10/13 1802   09/10/13 1600  metroNIDAZOLE (FLAGYL) IVPB 500 mg  Status:  Discontinued     500 mg 100 mL/hr over 60 Minutes Intravenous 3 times per day 09/10/13 1432 09/10/13 1802   09/09/13 1602  vancomycin (VANCOCIN) 1,250 mg in sodium chloride 0.9 % 250 mL IVPB  Status:  Discontinued     1,250 mg 166.7 mL/hr over 90 Minutes Intravenous  Every 24 hours 09/09/13 1057 09/10/13 1432   09/06/13 1600  imipenem-cilastatin (PRIMAXIN) 500 mg in sodium chloride 0.9 % 100 mL IVPB  Status:  Discontinued     500 mg 200 mL/hr over 30 Minutes Intravenous 3 times per day 09/06/13 1310 09/12/13 1048   09/03/13 1100  imipenem-cilastatin (PRIMAXIN) 500 mg in sodium chloride 0.9 % 100 mL IVPB  Status:  Discontinued     500 mg 200 mL/hr over 30 Minutes Intravenous Every 6 hours 09/03/13 1007 09/06/13 1310   09/02/13 1300  vancomycin (VANCOCIN) IVPB 1000 mg/200 mL premix  Status:  Discontinued     1,000 mg 200 mL/hr over 60 Minutes Intravenous Every 12 hours 09/01/13 1527 09/07/13 1340   08/30/13 2000  vancomycin (VANCOCIN) IVPB 750 mg/150 ml premix  Status:  Discontinued     750 mg 150 mL/hr over 60 Minutes Intravenous Every 8 hours 08/30/13 1356 09/01/13 1527  08/29/13 2200  micafungin (MYCAMINE) 100 mg in sodium chloride 0.9 % 100 mL IVPB  Status:  Discontinued     100 mg 100 mL/hr over 1 Hours Intravenous Every 24 hours 08/29/13 2036 09/10/13 1432   08/29/13 0400  vancomycin (VANCOCIN) IVPB 1000 mg/200 mL premix  Status:  Discontinued     1,000 mg 200 mL/hr over 60 Minutes Intravenous Every 8 hours 08/28/13 2358 08/30/13 1356   08/29/13 0400  piperacillin-tazobactam (ZOSYN) IVPB 3.375 g  Status:  Discontinued     3.375 g 12.5 mL/hr over 240 Minutes Intravenous 3 times per day 08/28/13 2358 09/03/13 1002   08/28/13 1845  [MAR Hold]  vancomycin (VANCOCIN) IVPB 1000 mg/200 mL premix     (On MAR Hold since 08/28/13 1957)   1,000 mg 200 mL/hr over 60 Minutes Intravenous  Once 08/28/13 1831 08/28/13 1955   08/28/13 1845  [MAR Hold]  piperacillin-tazobactam (ZOSYN) IVPB 3.375 g     (On MAR Hold since 08/28/13 1957)   3.375 g 100 mL/hr over 30 Minutes Intravenous  Once 08/28/13 1831 08/28/13 2056      Assessment/Plan: s/p Procedure(s): EXPLORATORY LAPAROTOMY,debridment of nacrotic stomach,and primary repair of perforated  stomach. Extubate Dressing changes bid. Can restart diet after extubation with clear liquids. Will review scans. Abdominal binder.  LOS: 18 days   Marta Lamas. Gae Bon, MD, FACS [phone] [facility] 09/15/2013

## 2013-09-16 ENCOUNTER — Encounter (HOSPITAL_COMMUNITY): Payer: Self-pay | Admitting: Physical Medicine and Rehabilitation

## 2013-09-16 DIAGNOSIS — G9340 Encephalopathy, unspecified: Secondary | ICD-10-CM

## 2013-09-16 LAB — CBC WITH DIFFERENTIAL/PLATELET
BASOS ABS: 0 10*3/uL (ref 0.0–0.1)
Basophils Relative: 0 % (ref 0–1)
Eosinophils Absolute: 0.1 10*3/uL (ref 0.0–0.7)
Eosinophils Relative: 1 % (ref 0–5)
HEMATOCRIT: 32.4 % — AB (ref 39.0–52.0)
Hemoglobin: 10.4 g/dL — ABNORMAL LOW (ref 13.0–17.0)
Lymphocytes Relative: 12 % (ref 12–46)
Lymphs Abs: 1.7 10*3/uL (ref 0.7–4.0)
MCH: 29.7 pg (ref 26.0–34.0)
MCHC: 32.1 g/dL (ref 30.0–36.0)
MCV: 92.6 fL (ref 78.0–100.0)
MONOS PCT: 12 % (ref 3–12)
Monocytes Absolute: 1.7 10*3/uL — ABNORMAL HIGH (ref 0.1–1.0)
Neutro Abs: 10.9 10*3/uL — ABNORMAL HIGH (ref 1.7–7.7)
Neutrophils Relative %: 75 % (ref 43–77)
PLATELETS: 388 10*3/uL (ref 150–400)
RBC: 3.5 MIL/uL — ABNORMAL LOW (ref 4.22–5.81)
RDW: 14.4 % (ref 11.5–15.5)
WBC: 14.4 10*3/uL — AB (ref 4.0–10.5)

## 2013-09-16 LAB — BASIC METABOLIC PANEL
BUN: 46 mg/dL — ABNORMAL HIGH (ref 6–23)
CHLORIDE: 115 meq/L — AB (ref 96–112)
CO2: 21 mEq/L (ref 19–32)
Calcium: 7.5 mg/dL — ABNORMAL LOW (ref 8.4–10.5)
Creatinine, Ser: 1.6 mg/dL — ABNORMAL HIGH (ref 0.50–1.35)
GFR calc non Af Amer: 49 mL/min — ABNORMAL LOW (ref >90)
GFR, EST AFRICAN AMERICAN: 57 mL/min — AB (ref >90)
Glucose, Bld: 123 mg/dL — ABNORMAL HIGH (ref 70–99)
POTASSIUM: 3.6 meq/L — AB (ref 3.7–5.3)
Sodium: 149 mEq/L — ABNORMAL HIGH (ref 137–147)

## 2013-09-16 LAB — GLUCOSE, CAPILLARY
GLUCOSE-CAPILLARY: 116 mg/dL — AB (ref 70–99)
GLUCOSE-CAPILLARY: 118 mg/dL — AB (ref 70–99)
GLUCOSE-CAPILLARY: 111 mg/dL — AB (ref 70–99)
Glucose-Capillary: 111 mg/dL — ABNORMAL HIGH (ref 70–99)
Glucose-Capillary: 113 mg/dL — ABNORMAL HIGH (ref 70–99)
Glucose-Capillary: 121 mg/dL — ABNORMAL HIGH (ref 70–99)

## 2013-09-16 MED ORDER — SERTRALINE HCL 50 MG PO TABS
50.0000 mg | ORAL_TABLET | Freq: Three times a day (TID) | ORAL | Status: DC
  Administered 2013-09-16 – 2013-09-21 (×14): 50 mg via ORAL
  Filled 2013-09-16 (×18): qty 1

## 2013-09-16 MED ORDER — FAT EMULSION 20 % IV EMUL
250.0000 mL | INTRAVENOUS | Status: AC
  Administered 2013-09-16: 250 mL via INTRAVENOUS
  Filled 2013-09-16: qty 250

## 2013-09-16 MED ORDER — HALOPERIDOL LACTATE 5 MG/ML IJ SOLN
1.0000 mg | INTRAMUSCULAR | Status: DC | PRN
  Administered 2013-09-16 – 2013-09-17 (×3): 4 mg via INTRAVENOUS
  Filled 2013-09-16 (×3): qty 1

## 2013-09-16 MED ORDER — LORAZEPAM 0.5 MG PO TABS
0.5000 mg | ORAL_TABLET | Freq: Every day | ORAL | Status: DC | PRN
  Administered 2013-09-16: 0.5 mg via ORAL
  Filled 2013-09-16: qty 1

## 2013-09-16 MED ORDER — INSULIN ASPART 100 UNIT/ML ~~LOC~~ SOLN
0.0000 [IU] | Freq: Four times a day (QID) | SUBCUTANEOUS | Status: DC
  Administered 2013-09-16 – 2013-09-18 (×7): 2 [IU] via SUBCUTANEOUS
  Administered 2013-09-18: 3 [IU] via SUBCUTANEOUS
  Administered 2013-09-19 – 2013-09-24 (×14): 2 [IU] via SUBCUTANEOUS

## 2013-09-16 MED ORDER — SODIUM CHLORIDE 0.9 % IV SOLN
INTRAVENOUS | Status: DC
  Administered 2013-09-16: 1 mL via INTRAVENOUS

## 2013-09-16 MED ORDER — TRACE MINERALS CR-CU-F-FE-I-MN-MO-SE-ZN IV SOLN
INTRAVENOUS | Status: AC
  Administered 2013-09-16: 17:00:00 via INTRAVENOUS
  Filled 2013-09-16: qty 2400

## 2013-09-16 MED ORDER — DEXTROSE 10 % IV SOLN
INTRAVENOUS | Status: DC
  Administered 2013-09-16: 14:00:00 via INTRAVENOUS

## 2013-09-16 MED FILL — Medication: Qty: 1 | Status: AC

## 2013-09-16 NOTE — Progress Notes (Signed)
PT Cancellation Note  Patient Details Name: Pamalee LeydenChristopher D Gomez MRN: [id number] DOB: 02/03/1966   Cancelled Treatment:    Reason Eval/Treat Not Completed: Patient at procedure or test/unavailable.  Getting a PICC placed at my 3rd attempt. 09/16/2013  [place] BingKen Phinley Schall, PT [phone]  (pager)    Maxine Huynh, Eliseo GumKenneth V 09/16/2013, 3:48 PM

## 2013-09-16 NOTE — Progress Notes (Signed)
[facility]     Name: James LeydenChristopher D Narayan MRN: [id number] DOB: 07/11/1966    ADMISSION DATE:  08/28/2013 CONSULTATION DATE:  08/28/13 LOS 19 days  REFERRING MD :  Andrey CampanileWilson PRIMARY SERVICE: [facility]  BRIEF PATIENT DESCRIPTION:  48 yo male smoker presented with abdominal pain from perforated gastric ulcer in setting of NSAID.  PCCM consulted to assist with septic shock and respiratory failure management.  Required reintubation 1/24.   SIGNIFICANT EVENTS: 1/8: Exploratory laparotomy, debridement of necrotic stomach with scissors, primary repair of perforated stomach in 2 layers.  Septic shock, VDRF, a fib post-op. 1/9: Cardiology consulted . ECHO EF 45% 1/11: still pressor dependent, NSR for > 24 hrs. Amiodarone stopped.  1/12: remain son levophed,. Left radial a line dc'ed 09/02/13: off levophed. Encephalopathic - randomly agitated. Mmit 08/28/2013 . TPN continuesoving around, biting tube, looks randomly, does not track. Failed SBT. Febrile. + 24 Liters. Back on neo 09/03/13: ARDS Protocol with 48h nimbex started. Left radial artery occluded - seen by VVS. Expectant mgmt. 1/15: 50% fio2 on ARDS protocol with nimbex. Left radial artery occlusion events noted. 1/17 off paralytics 1/19 Extubated 1/20 Precedex for agitation 1/20 Speech therapy swallow eval; d/c solu cortef 1/21 Diarrhea, increased WBC 1/22 Trial on BiPAP 1/24 reintubated   STUDIES:  1/8 CT abd/pelvis >> pneumoperitoneum, Lt kidney stone, diverticulosis 1/9 Echo >> mild LVH, EF 40 to 45%, grade 1 diastolic dysfx 1/14 Lt upper ext arterial duplex >> no evidence for thrombosis in Lt arm 1/16 CT abd/pelvis >> b/l pleural effusions, areas of GGO and consolidation, no leak from stomach or small bowel  LINES / TUBES: 1/8 ETT >> 1/19 1/24 ETT >> 1/26 1/8 Rt IJ CVL >> 1/20 1/8 Lt radial aline >>>1/12 1/14 Rt radial aline>>1/19 1/20 Rt PICC >>  CULTURES: Blood 1/9 >> negative Tracheal aspirate 1/16 >> negative C diff 1/21 >>POS    ANTIBIOTICS: Vancomycin 1/8 >>>1/20 Zosyn 1/8 >>>>1/14 Micafungin 1/9 >>>1/21 Imipenem 1/14 >>> 1/23 Flagyl 1/21>>> Vancomycin (oral) 1/21>>>  SUBJECTIVE: afebrile No CP, dyspnea Confused   VITAL SIGNS: Temp:  [97.5 F (36.4 C)-101 F (38.3 C)] 98.9 F (37.2 C) (01/27 0830) Pulse Rate:  [53-157] 100 (01/27 0830) Resp:  [16-29] 25 (01/27 0830) BP: (112-187)/(57-103) 169/96 mmHg (01/27 0830) SpO2:  [69 %-100 %] 96 % (01/27 0830) Weight:  [100.1 kg (220 lb 10.9 oz)] 100.1 kg (220 lb 10.9 oz) (01/27 0600) Room air  INTAKE / OUTPUT: Intake/Output     01/26 0701 - 01/27 0700 01/27 0701 - 01/28 0700   I.V. (mL/kg) 470.6 (4.7) 20 (0.2)   NG/GT 30    IV Piggyback 300    TPN 2530 110   Total Intake(mL/kg) 3330.6 (33.3) 130 (1.3)   Urine (mL/kg/hr) 4030 (1.7) 250 (1)   Emesis/NG output     Drains     Total Output 4030 250   Net -699.4 -120          PHYSICAL EXAMINATION: General: no distress  Neuro: awake, alert, follows commands, able to read clock, oriented to time/place HEENT: ETT, PERRL Cardiovascular: regular Lungs: no wheeze, decreased at bases, strong cough Abdomen: wound dressing w some purulence, also coming out of his JP drains Musculoskeletal: ankle edema Skin: area of ecchymosis on Lt forearm  LABS: PULMONARY  Recent Labs Lab 09/13/13 1402 09/13/13 1415 09/13/13 1635  PHART 7.222* 7.180* 7.284*  PCO2ART 69.2* 75.9* 55.3*  PO2ART 60.0* 84.0 272.0*  HCO3 28.4* 28.3* 26.2*  TCO2 31 31 28   O2SAT  84.0 92.0 100.0   CBC  Recent Labs Lab 09/12/13 0415 09/15/13 0400 09/16/13 0849  HGB 11.0* 10.5* 10.4*  HCT 33.4* 33.7* 32.4*  WBC 15.8* 15.0* 14.4*  PLT 470* 388 388   CARDIAC   No results found for this basename: TROPONINI,  in the last 168 hours No results found for this basename: PROBNP,  in the last 168 hours CHEMISTRY  Recent Labs Lab 09/10/13 0435 09/11/13 0407 09/12/13 0415 09/13/13 0352 09/14/13 0400 09/14/13 1830  09/15/13 0400  NA 145 148* 150* 148* 152* 151* 153*  K 3.0* 3.4* 3.1* 3.6* 3.9 4.3 4.1  CL 98 103 108 109 116* 116* 117*  CO2 32 29 27 25 23 24 22   GLUCOSE 135* 152* 156* 149* 137* 124* 128*  BUN 76* 63* 63* 58* 69* 61* 56*  CREATININE 1.97* 1.92* 2.01* 1.81* 2.14* 1.89* 1.73*  CALCIUM 8.0* 8.0* 7.7* 7.8* 7.7* 7.7* 7.6*  MG 2.6* 2.6*  --  2.6* 2.8*  --  2.3  PHOS  --  4.6  --  5.6* 5.6*  --  3.7   Estimated Creatinine Clearance: 62.2 ml/min (by C-G formula based on Cr of 1.73).  LIVER  Recent Labs Lab 09/11/13 0407 09/15/13 0400  AST 33 12  ALT 25 10  ALKPHOS 178* 81  BILITOT 0.8 0.4  PROT 7.0 6.4  ALBUMIN 2.0* 1.8*   ENDOCRINE CBG (last 3)   Recent Labs  09/15/13 1941 09/16/13 0009 09/16/13 0345  GLUCAP 110* 111* 118*   IMAGING x48h  Dg Chest Port 1 View  09/15/2013   CLINICAL DATA:  Acute respiratory failure.  EXAM: PORTABLE CHEST - 1 VIEW  COMPARISON:  September 14, 2013.  FINDINGS: Endotracheal and nasogastric tubes are unchanged in position. No pneumothorax or significant pleural effusion is noted. Minimal bilateral basilar opacity is noted most consistent with subsegmental atelectasis. Bony thorax is intact. Degenerative changes of lower thoracic spine are noted.  IMPRESSION: Minimal bilateral basilar opacities are noted most consistent with subsegmental atelectasis.   Electronically Signed   By: Roque Lias M.D.   On: 09/15/2013 08:31   ASSESSMENT / PLAN:  PULMONARY A:  Acute respiratory failure with ALI in setting of peritonitis >> required reintubation 1/24 due to secretion management and then hypercapneic failure B/L small pleural effusions with passive atelectasis  Hx of smoking, likely COPD >> air trapping on vent P: -ct scheduled BD's    CARDIAC A:  Septic shock 2nd to peritonitis >> resolved. A fib with RVR >>resolved. Was on IV amiodarone 1/9 to 1/10. Acute systolic heart failure likely 2nd to sepsis. NSTEMI from demand ischemia. Hx of HTN  with chronic diastolic dysfx. P:  -prn hydralazine, labetalol until he can take PO BP control -will need cardiac evaluation and f/u Echo at some point  RENAL A:   Acute kidney injury 2nd to hypovolemia, and ATN in setting of shock >> improving. Metabolic acidosis >> resolved. Hypernatremia 2nd to diuresis. Hypokalemia. Hypervolemia >> improving. P:   -f/u renal fx, urine outpt -f/u and replace electrolytes as needed -KVO IV fluids -held lasix 1/23, correct free water deficit through his TPN and free water (he was cleared for PO by CCS post-gastric repair)  GASTROINTESTINAL A:   Perforated gastric ulcer in setting of NSAID use as outpt. Protein calorie malnutrition. Dypshagia. P:   -Post -op care, wound care, drains per CCS -Protonix BID --OK'd for clear diet-transition off TNA once PO intake improves   HEMATOLOGIC A:   Anemia of  critical illness Low platelet 2nd to sepsis; HIT negative 1/13 >> resolved. P:  -f/u CBC intermittently -SQ lovenox for DVT prevention.  INFECTIOUS A:  Septic shock 2nd to peritonitis. Recurrent fever with possible HCAP 1/14.  Diarrhea, increased WBC 1/21 2nd to C diff >> improving. P:   -IV flagyl + po vancomycin for C diff -no clear indication for repeat CT scan abd at this time  ENDOCRINE A:   Hyperglycemia. Relative adrenal insufficiency in setting of sepsis >> cortisol 14.7 from 1/17  >> resolved. P:   -SSI   NEUROLOGIC A:   Acute encephalopathy 2nd to septic shock >> improved. Deconditioning. delerium - multifactorial-  critical illness, organ failure, hyperNa P:  Haldol prn Ativan 7 zoloft restarted by surgery-precedex had worked well earlier PT  SUmmary - Delerium main issue now- needs aggressive PT & CIR consult OK to transfer to SDU  Care during the described time interval was provided by me and/or other providers on the critical care team.  I have reviewed this patient's available data, including medical history,  events of note, physical examination and test results as part of my evaluation  CC time x 21m  Cyril Mourning MD. FCCP. [facility] Pager [phone] If no response call [phone]     09/16/2013, 9:34 AM

## 2013-09-16 NOTE — Progress Notes (Signed)
[facility]/Dest[facility] Neubecker Progress Note 19 Days Post-Op  Subjective: Patient is conversant, but may be confabulating at times.  Has not eaten since being extubated.  Needs to be on psychiatric medications.  Objective: Vital signs in last 24 hours: Temp:  [97.5 F (36.4 C)-101 F (38.3 C)] 98.3 F (36.8 C) (01/27 0400) Pulse Rate:  [53-157] 110 (01/27 0600) Resp:  [16-29] 25 (01/27 0600) BP: (112-187)/(57-103) 161/96 mmHg (01/27 0600) SpO2:  [91 %-100 %] 97 % (01/27 0735) FiO2 (%):  [40 %] 40 % (01/26 0900) Weight:  [100.1 kg (220 lb 10.9 oz)] 100.1 kg (220 lb 10.9 oz) (01/27 0600) Last BM Date: 09/15/13  Intake/Output from previous day: 01/26 0701 - 01/27 0700 In: 3330.6 [I.V.:470.6; NG/GT:30; IV Piggyback:300; TPN:2530] Out: 4030 [Urine:4030] Intake/Output this shift:    General: No acute distress.  Lungs: Clear to auscultation  Abd: Soft, good bowel sounds.  Midline wound is covered and viewed yesterday.  Will take a picture for the chart tomorrow.  Extremities: No clincal signs or symptoms of DVT  Neuro: Intact, but a bit confused.  Lab Results:  @LABLAST2 (wbc:2,hgb:2,hct:2,plt:2) BMET  Recent Labs  09/14/13 1830 09/15/13 0400  NA 151* 153*  K 4.3 4.1  CL 116* 117*  CO2 24 22  GLUCOSE 124* 128*  BUN 61* 56*  CREATININE 1.89* 1.73*  CALCIUM 7.7* 7.6*   PT/INR No results found for this basename: LABPROT, INR,  in the last 72 hours ABG  Recent Labs  09/13/13 1415 09/13/13 1635  PHART 7.180* 7.284*  HCO3 28.3* 26.2*    Studies/Results: Dg Chest Port 1 View  09/15/2013   CLINICAL DATA:  Acute respiratory failure.  EXAM: PORTABLE CHEST - 1 VIEW  COMPARISON:  September 14, 2013.  FINDINGS: Endotracheal and nasogastric tubes are unchanged in position. No pneumothorax or significant pleural effusion is noted. Minimal bilateral basilar opacity is noted most consistent with subsegmental atelectasis. Bony thorax is intact. Degenerative changes of lower thoracic spine are  noted.  IMPRESSION: Minimal bilateral basilar opacities are noted most consistent with subsegmental atelectasis.   Electronically Signed   By: Roque Lias M.D.   On: 09/15/2013 08:31   Portable Chest Xray In Am  09/14/2013   CLINICAL DATA:  Acute respiratory failure. Septic shock. On ventilator.  EXAM: PORTABLE CHEST - 1 VIEW  COMPARISON:  09/13/2013  FINDINGS: Endotracheal tube is seen in appropriate position. Right arm PICC line is now in appropriate position, with tip overlying the superior cavoatrial junction. A new nasogastric tube is seen with tip in the stomach.  Decreased right upper lobe airspace disease in volume loss is seen since previous study. Decreased opacity also seen at left lung base since prior exam. No new or worsening areas of pulmonary opacity seen. Heart size is normal.  IMPRESSION: Decrease in right upper lobe and left basilar infiltrates. Support lines and tubes in appropriate position.   Electronically Signed   By: Myles Rosenthal M.D.   On: 09/14/2013 14:25    Anti-infectives: Anti-infectives   Start     Dose/Rate Route Frequency Ordered Stop   09/10/13 2200  metroNIDAZOLE (FLAGYL) IVPB 500 mg     500 mg 100 mL/hr over 60 Minutes Intravenous 3 times per day 09/10/13 1802 09/24/13 1359   09/10/13 2000  vancomycin (VANCOCIN) 50 mg/mL oral solution 500 mg     500 mg Oral 4 times per day 09/10/13 1802 09/24/13 1159   09/10/13 1800  vancomycin (VANCOCIN) 50 mg/mL oral solution 500 mg  Status:  Discontinued     500 mg Oral 4 times per day 09/10/13 1432 09/10/13 1802   09/10/13 1600  metroNIDAZOLE (FLAGYL) IVPB 500 mg  Status:  Discontinued     500 mg 100 mL/hr over 60 Minutes Intravenous 3 times per day 09/10/13 1432 09/10/13 1802   09/09/13 1602  vancomycin (VANCOCIN) 1,250 mg in sodium chloride 0.9 % 250 mL IVPB  Status:  Discontinued     1,250 mg 166.7 mL/hr over 90 Minutes Intravenous Every 24 hours 09/09/13 1057 09/10/13 1432   09/06/13 1600  imipenem-cilastatin  (PRIMAXIN) 500 mg in sodium chloride 0.9 % 100 mL IVPB  Status:  Discontinued     500 mg 200 mL/hr over 30 Minutes Intravenous 3 times per day 09/06/13 1310 09/12/13 1048   09/03/13 1100  imipenem-cilastatin (PRIMAXIN) 500 mg in sodium chloride 0.9 % 100 mL IVPB  Status:  Discontinued     500 mg 200 mL/hr over 30 Minutes Intravenous Every 6 hours 09/03/13 1007 09/06/13 1310   09/02/13 1300  vancomycin (VANCOCIN) IVPB 1000 mg/200 mL premix  Status:  Discontinued     1,000 mg 200 mL/hr over 60 Minutes Intravenous Every 12 hours 09/01/13 1527 09/07/13 1340   08/30/13 2000  vancomycin (VANCOCIN) IVPB 750 mg/150 ml premix  Status:  Discontinued     750 mg 150 mL/hr over 60 Minutes Intravenous Every 8 hours 08/30/13 1356 09/01/13 1527   08/29/13 2200  micafungin (MYCAMINE) 100 mg in sodium chloride 0.9 % 100 mL IVPB  Status:  Discontinued     100 mg 100 mL/hr over 1 Hours Intravenous Every 24 hours 08/29/13 2036 09/10/13 1432   08/29/13 0400  vancomycin (VANCOCIN) IVPB 1000 mg/200 mL premix  Status:  Discontinued     1,000 mg 200 mL/hr over 60 Minutes Intravenous Every 8 hours 08/28/13 2358 08/30/13 1356   08/29/13 0400  piperacillin-tazobactam (ZOSYN) IVPB 3.375 g  Status:  Discontinued     3.375 g 12.5 mL/hr over 240 Minutes Intravenous 3 times per day 08/28/13 2358 09/03/13 1002   08/28/13 1845  [MAR Hold]  vancomycin (VANCOCIN) IVPB 1000 mg/200 mL premix     (On MAR Hold since 08/28/13 1957)   1,000 mg 200 mL/hr over 60 Minutes Intravenous  Once 08/28/13 1831 08/28/13 1955   08/28/13 1845  [MAR Hold]  piperacillin-tazobactam (ZOSYN) IVPB 3.375 g     (On MAR Hold since 08/28/13 1957)   3.375 g 100 mL/hr over 30 Minutes Intravenous  Once 08/28/13 1831 08/28/13 2056      Assessment/Plan: s/p Procedure(s): EXPLORATORY LAPAROTOMY,debridment of nacrotic stomach,and primary repair of perforated stomach. d/c foley Advance diet Continue ABX therapy due to Post-op infection Some  IVFs. Recheck labs.  LOS: 19 days   Marta LamasJames O. Gae BonWyatt, III, MD, FACS [phone]--pager [phone]--office [facility] 09/16/2013

## 2013-09-16 NOTE — Progress Notes (Signed)
PARENTERAL NUTRITION CONSULT NOTE - FOLLOW UP  Pharmacy Consult for TPN Indication: Prolonged ileus  No Known Allergies  Patient Measurements: Height: 5\' 11"  (180.3 cm) Weight: 220 lb 10.9 oz (100.1 kg) IBW/kg (Calculated) : 75.3 Vital Signs: Temp: 98.3 F (36.8 C) (01/27 0400) Temp src: Oral (01/27 0400) BP: 161/96 mmHg (01/27 0600) Pulse Rate: 110 (01/27 0600) Intake/Output from previous day: 01/26 0701 - 01/27 0700 In: 3330.6 [I.V.:470.6; NG/GT:30; IV Piggyback:300; TPN:2530] Out: 4030 [Urine:4030] Intake/Output from this shift:    Labs:  Recent Labs  09/15/13 0400  WBC 15.0*  HGB 10.5*  HCT 33.7*  PLT 388    Recent Labs  09/14/13 0400 09/14/13 1830 09/15/13 0400  NA 152* 151* 153*  K 3.9 4.3 4.1  CL 116* 116* 117*  CO2 23 24 22   GLUCOSE 137* 124* 128*  BUN 69* 61* 56*  CREATININE 2.14* 1.89* 1.73*  CALCIUM 7.7* 7.7* 7.6*  MG 2.8*  --  2.3  PHOS 5.6*  --  3.7  PROT  --   --  6.4  ALBUMIN  --   --  1.8*  AST  --   --  12  ALT  --   --  10  ALKPHOS  --   --  81  BILITOT  --   --  0.4  PREALBUMIN  --   --  10.7*  TRIG  --   --  110   Estimated Creatinine Clearance: 62.2 ml/min (by C-G formula based on Cr of 1.73).    Recent Labs  09/15/13 1941 09/16/13 0009 09/16/13 0345  GLUCAP 110* 111* 118*   Insulin Requirements in the past 24 hours:   24 units insulin in TPN + 4 units from SSI  Current Nutrition:  Clinimix E 5/15 at 156mL/hr + lipid emulsion 20% at 6mL/hr  Nutritional Goals:  Goal 2250-2450 kcal and 150-160gm protein per RD 09/15/13  Assessment:  48 y.o. male presented to ED 1/8 with abd pain. CT consistent with perforated stomach- ulcer due to NSAID use, pneumoperitoneum, peritonitis. Taken emergently to OR where he underwent exp lap, debridement of necrotic stomach, repair of perforated stomach. Contrast study showed no leak from stomach or small bowel.  Off Pivot TF since 1/19.  GI: Pt has not eaten anything yet post-extubation  1/26. To begin clear liquids today. Prealbumin 10.7 -remains low but trending up.  Endo: No h/o DM. CBGs are at goal on SSI + insulin in TPN  Lytes: Na 153, Corr Ca 9.6, other lytes wnl.  Renal: 1/26 Scr 1.73- trending down. UOP 1.7 ml/kg/hr. I/O Neg 0.5 L yesterday.  Pulm: Extubated 1/26. RA  Cards: S/p cardiac arrest with CPR 1/24 until ROSC. BP elevated, HR 110s. Prn metoprolol, labetalol, hydrazaline.  Hepatobil: LFTs wnl. Trig down to 110.  Neuro: Pt confused post-extubation. Restarting home sertraline.  ID: CDiff PCR positive 1/21- IV Flagyl and PO vancomycin. Afeb. WBC 15.  Vanc 1/8>>1/21 Zosyn 1/8>>1/14 Micafungin 1/9>>1/21 Primaxin 1/14>>1/23 Vanc PO 1/21>> Flagyl 1/21>>  Best Practices: lovenox, PPI IV, MC  TPN Access: triple lumen PICC (1/20)  TPN day#: 18  Plan:  1. Continue Clinimix 5/15 at 168mL/hr and lipid emulsion 20% at 29mL/hr (provides 120gm protein/day - 80% goal + 2184 kcal/day - 97% goal). Unable to meet full nutrition goals due to limitations of pre-mix clinimix bags - will continue with no electrolytes today. Pt is receiving very little sodium in the TPN so hypernatremia is likely related to other issues. 2. Continue multivitamin and  full trace elements in TPN 3. Continue 24 units regular insulin in TPN 4. Change SSI and CBGs to q6h 5. F/u labs in a.m.  6. F/u diet tolerance and ability to advance.  Christoper Fabianaron Kaycen Whitworth, PharmD, BCPS Clinical pharmacist, pager [phone]  09/16/2013 7:58 AM

## 2013-09-16 NOTE — Consult Note (Signed)
Physical Medicine and Rehabilitation Consult  Reason for Consult:  Deconditioning with encephalopathy due to sepsis from ruptured stomach ulcer.  Referring Physician: Dr. Vassie LollAlva    HPI: James Mccormick is a 48 y.o. male with HTN, chronic headaches and GERD who presented with a perforated stomach ulcer due to NSAID use who underwent exploratory laparotomy with debridment of nacrotic stomach,and primary repair of perforated stomach on 08/28/13. Has had persistent peritonitis and shock and continues on IV antibiotics.  Remained on ventilator with acidosis and high dose pressor support. On 08/29/13, he developed AF with RVR with rates up to 200 bpm and worsening hypotension and underwent attempted DC-CV by CCM but was unsuccessful. He was loaded with amiodarone and Dr. Ollen BowlBenhimhon consulted for input and felt that arrythmia due to critical illness and patient not anti-coagulation candidate due to perforated ulcer. Extubated on 09/08/13 and had intermittent bouts of confusion and agitation due to delirium. NPO with TNA for nutritional support. VAC placed on midline wound on  09/10/12 but d/c today due to difficulty with blockage--damp to dry dressing changes ongoing.  He developed c diff colitis and flagyl added to broad spectrum antibiotics on 09/10/13. Patient with hypersomnolence with hypoxia progressing to brady arrest on 09/13/13. [facility] and re- intubated through 09/15/13. Zoloft resumed in hopes of clearing cognitive status.  PT resumed and CIR recommended due to deconditioned state.     Review of Systems  HENT: Negative for hearing loss.   Respiratory: Negative for cough and shortness of breath.   Cardiovascular: Negative for chest pain and palpitations.  Gastrointestinal: Negative for nausea, vomiting and abdominal pain.  Musculoskeletal: Positive for back pain and myalgias.  Neurological: Positive for weakness and headaches (chronic).     Past Medical  History  Diagnosis Date  . GERD (gastroesophageal reflux disease)   . Hypertension     Past Surgical History  Procedure Laterality Date  . Neck surgery    . Laparotomy N/A 08/28/2013    Procedure: EXPLORATORY LAPAROTOMY,debridment of nacrotic stomach,and primary repair of perforated stomach.;  Surgeon: James InaEric M Wilson, MD;  [facility];  Service: General;  Laterality: N/A;    Family History  Problem Relation Age of Onset  . Mental illness Mother   . Hypertension Sister   . Hypertension Brother     Social History: Divorced [employer]. 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 He  reports that he has been smoking Cigarettes.  He has been smoking about 1.00 pack per day. He does not have any smokeless tobacco history on file. 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  He reports that he does not use illicit drugs.  Allergies: No Known Allergies  Medications Prior to Admission  Medication Sig Dispense Refill  . LORazepam (ATIVAN) 0.5 MG tablet Take 0.5 mg by mouth daily as needed for anxiety.      James Mccormick. omeprazole (PRILOSEC) 20 MG capsule Take 20 mg by mouth daily.      . sertraline (ZOLOFT) 50 MG tablet Take 50 mg by mouth 3 (three) times daily.      . valsartan (DIOVAN) 320 MG tablet Take 320 mg by mouth daily.        Home: Home Living Family/patient expects to be discharged to:: Private residence Living Arrangements: Other (Comment) (daughter lives with him) Available Help at Discharge: Family (daughter can assist and per pt is the only one able to help) Type of  Home: Mobile home Home Access: Stairs to enter Entrance Stairs-Number of Steps: 2 Entrance Stairs-Rails: Right;Left Home Layout: One level Home Equipment: None  Functional History:   Functional Status:  Mobility:     Ambulation/Gait Ambulation Distance (Feet): 50 Feet Gait velocity: slow General Gait Details: Gait WFL limited by HR rising through 140's to max of 171 before able to get back to  chair to sit down.    ADL:    Cognition: Cognition Overall Cognitive Status: Within Functional Limits for tasks assessed Orientation Level: Oriented to person;Oriented to place;Oriented to situation;Disoriented to time (slight confusion) Cognition Arousal/Alertness: Awake/alert Behavior During Therapy: WFL for tasks assessed/performed Overall Cognitive Status: Within Functional Limits for tasks assessed  Blood pressure 178/101, pulse 110, temperature 99.6 F (37.6 C), temperature source Oral, resp. rate 28, height 5\' 11"  (1.803 m), weight 100.1 kg (220 lb 10.9 oz), SpO2 97.00%. Physical Exam  Nursing note and vitals reviewed. Constitutional: He appears well-developed and well-nourished.  Flat affect with slow speech. Recent fall with  dislodging of lines reported by nursing.   HENT:  Head: Normocephalic and atraumatic.  Eyes: EOM are normal. Pupils are equal, round, and reactive to light.  Neck: Normal range of motion. Neck supple.  Cardiovascular:  Tachycardic--heart rate 140-145 during exam.   Respiratory: Effort normal and breath sounds normal. No respiratory distress. He has no wheezes.  GI: Soft. Bowel sounds are normal.  Neurological: He is alert.  Oriented to self and place. Unable to recall situation without cues. Poor insight with decreased awareness, impaired memory as well as distraction noted. occasional inappropriate comments.  Able to follow basic commands. Moves all 4 limbs and can sense pain. Told me he lived with his daughter in [place]  Skin: Skin is warm and dry.  Healed scars on BLE (folliculitis)    Results for orders placed during the hospital encounter of 08/28/13 (from the past 24 hour(s))  GLUCOSE, CAPILLARY     Status: Abnormal   Collection Time    09/15/13  3:18 PM      Result Value Range   Glucose-Capillary 120 (*) 70 - 99 mg/dL   Comment 1 Documented in Chart     Comment 2 Notify RN    GLUCOSE, CAPILLARY     Status: Abnormal   Collection Time     09/15/13  7:41 PM      Result Value Range   Glucose-Capillary 110 (*) 70 - 99 mg/dL   Comment 1 Documented in Chart     Comment 2 Notify RN    GLUCOSE, CAPILLARY     Status: Abnormal   Collection Time    09/16/13 12:09 AM      Result Value Range   Glucose-Capillary 111 (*) 70 - 99 mg/dL   Comment 1 Documented in Chart     Comment 2 Notify RN    GLUCOSE, CAPILLARY     Status: Abnormal   Collection Time    09/16/13  3:45 AM      Result Value Range   Glucose-Capillary 118 (*) 70 - 99 mg/dL   Comment 1 Notify RN     Comment 2 Documented in Chart    GLUCOSE, CAPILLARY     Status: Abnormal   Collection Time    09/16/13  8:27 AM      Result Value Range   Glucose-Capillary 121 (*) 70 - 99 mg/dL   Comment 1 Documented in Chart     Comment 2 Notify RN    CBC  WITH DIFFERENTIAL     Status: Abnormal   Collection Time    09/16/13  8:49 AM      Result Value Range   WBC 14.4 (*) 4.0 - 10.5 K/uL   RBC 3.50 (*) 4.22 - 5.81 MIL/uL   Hemoglobin 10.4 (*) 13.0 - 17.0 g/dL   HCT 16.1 (*) 09.6 - 04.5 %   MCV 92.6  78.0 - 100.0 fL   MCH 29.7  26.0 - 34.0 pg   MCHC 32.1  30.0 - 36.0 g/dL   RDW 40.9  81.1 - 91.4 %   Platelets 388  150 - 400 K/uL   Neutrophils Relative % 75  43 - 77 %   Lymphocytes Relative 12  12 - 46 %   Monocytes Relative 12  3 - 12 %   Eosinophils Relative 1  0 - 5 %   Basophils Relative 0  0 - 1 %   Neutro Abs 10.9 (*) 1.7 - 7.7 K/uL   Lymphs Abs 1.7  0.7 - 4.0 K/uL   Monocytes Absolute 1.7 (*) 0.1 - 1.0 K/uL   Eosinophils Absolute 0.1  0.0 - 0.7 K/uL   Basophils Absolute 0.0  0.0 - 0.1 K/uL   WBC Morphology MILD LEFT SHIFT (1-5% METAS, OCC MYELO, OCC BANDS)    BASIC METABOLIC PANEL     Status: Abnormal   Collection Time    09/16/13  8:49 AM      Result Value Range   Sodium 149 (*) 137 - 147 mEq/L   Potassium 3.6 (*) 3.7 - 5.3 mEq/L   Chloride 115 (*) 96 - 112 mEq/L   CO2 21  19 - 32 mEq/L   Glucose, Bld 123 (*) 70 - 99 mg/dL   BUN 46 (*) 6 - 23 mg/dL    Creatinine, Ser 7.82 (*) 0.50 - 1.35 mg/dL   Calcium 7.5 (*) 8.4 - 10.5 mg/dL   GFR calc non Af Amer 49 (*) >90 mL/min   GFR calc Af Amer 57 (*) >90 mL/min  GLUCOSE, CAPILLARY     Status: Abnormal   Collection Time    09/16/13 11:39 AM      Result Value Range   Glucose-Capillary 111 (*) 70 - 99 mg/dL   Comment 1 Documented in Chart     Comment 2 Notify RN     Dg Chest Port 1 View  09/15/2013   CLINICAL DATA:  Acute respiratory failure.  EXAM: PORTABLE CHEST - 1 VIEW  COMPARISON:  September 14, 2013.  FINDINGS: Endotracheal and nasogastric tubes are unchanged in position. No pneumothorax or significant pleural effusion is noted. Minimal bilateral basilar opacity is noted most consistent with subsegmental atelectasis. Bony thorax is intact. Degenerative changes of lower thoracic spine are noted.  IMPRESSION: Minimal bilateral basilar opacities are noted most consistent with subsegmental atelectasis.   Electronically Signed   By: Roque Lias M.D.   On: 09/15/2013 08:31    Assessment/Plan: Diagnosis: deconditioning, encephalopathy 1. Does the need for close, 24 hr/day medical supervision in concert with the patient's rehab needs make it unreasonable for this patient to be served in a less intensive setting? Yes 2. Co-Morbidities requiring supervision/potential complications: ards, afib 3. Due to bladder management, bowel management, safety, skin/wound care, disease management, medication administration, pain management and patient education, does the patient require 24 hr/day rehab nursing? Yes 4. Does the patient require coordinated care of a physician, rehab nurse, PT (1-2 hrs/day, 5 days/week), OT (1-2 hrs/day, 5 days/week) and SLP (  1-2 hrs/day, 5 days/week) to address physical and functional deficits in the context of the above medical diagnosis(es)? Yes Addressing deficits in the following areas: balance, endurance, strength, transferring, bathing, feeding, cognition and psychosocial  support 5. Can the patient actively participate in an intensive therapy program of at least 3 hrs of therapy per day at least 5 days per week? Yes 6. The potential for patient to make measurable gains while on inpatient rehab is good 7. Anticipated functional outcomes upon discharge from inpatient rehab are supervision with PT, supervision to min assist with OT, supervision with SLP. 8. Estimated rehab length of stay to reach the above functional goals is: 7-10 days (cognition) 9. Does the patient have adequate social supports to accommodate these discharge functional goals? Potentially 10. Anticipated D/C setting: Home 11. Anticipated post D/C treatments: HH therapy 12. Overall Rehab/Functional Prognosis: good  RECOMMENDATIONS: This patient's condition is appropriate for continued rehabilitative care in the following setting: CIR Patient has agreed to participate in recommended program. Yes Note that insurance prior authorization may be required for reimbursement for recommended care.  Comment: need to follow up on social supports. Still has a sitter in the room. Rehab Admissions Coordinator to follow up.  Thanks,  Ranelle Oyster, MD, [place] [facility]     09/16/2013

## 2013-09-17 DIAGNOSIS — G92 Toxic encephalopathy: Secondary | ICD-10-CM

## 2013-09-17 DIAGNOSIS — E46 Unspecified protein-calorie malnutrition: Secondary | ICD-10-CM

## 2013-09-17 DIAGNOSIS — I10 Essential (primary) hypertension: Secondary | ICD-10-CM

## 2013-09-17 DIAGNOSIS — K271 Acute peptic ulcer, site unspecified, with perforation: Secondary | ICD-10-CM

## 2013-09-17 DIAGNOSIS — E876 Hypokalemia: Secondary | ICD-10-CM

## 2013-09-17 DIAGNOSIS — R5381 Other malaise: Secondary | ICD-10-CM

## 2013-09-17 DIAGNOSIS — A0472 Enterocolitis due to Clostridium difficile, not specified as recurrent: Secondary | ICD-10-CM

## 2013-09-17 DIAGNOSIS — G929 Unspecified toxic encephalopathy: Secondary | ICD-10-CM

## 2013-09-17 LAB — BASIC METABOLIC PANEL
BUN: 42 mg/dL — ABNORMAL HIGH (ref 6–23)
CHLORIDE: 116 meq/L — AB (ref 96–112)
CO2: 19 mEq/L (ref 19–32)
Calcium: 7.5 mg/dL — ABNORMAL LOW (ref 8.4–10.5)
Creatinine, Ser: 1.69 mg/dL — ABNORMAL HIGH (ref 0.50–1.35)
GFR calc non Af Amer: 46 mL/min — ABNORMAL LOW (ref >90)
GFR, EST AFRICAN AMERICAN: 53 mL/min — AB (ref >90)
Glucose, Bld: 124 mg/dL — ABNORMAL HIGH (ref 70–99)
POTASSIUM: 3.4 meq/L — AB (ref 3.7–5.3)
SODIUM: 149 meq/L — AB (ref 137–147)

## 2013-09-17 LAB — GLUCOSE, CAPILLARY
GLUCOSE-CAPILLARY: 114 mg/dL — AB (ref 70–99)
GLUCOSE-CAPILLARY: 122 mg/dL — AB (ref 70–99)
GLUCOSE-CAPILLARY: 123 mg/dL — AB (ref 70–99)
GLUCOSE-CAPILLARY: 126 mg/dL — AB (ref 70–99)
GLUCOSE-CAPILLARY: 130 mg/dL — AB (ref 70–99)
GLUCOSE-CAPILLARY: 138 mg/dL — AB (ref 70–99)
Glucose-Capillary: 150 mg/dL — ABNORMAL HIGH (ref 70–99)

## 2013-09-17 LAB — CBC WITH DIFFERENTIAL/PLATELET
BASOS PCT: 0 % (ref 0–1)
Basophils Absolute: 0 10*3/uL (ref 0.0–0.1)
EOS ABS: 0 10*3/uL (ref 0.0–0.7)
EOS PCT: 0 % (ref 0–5)
HCT: 30.5 % — ABNORMAL LOW (ref 39.0–52.0)
HEMOGLOBIN: 10.1 g/dL — AB (ref 13.0–17.0)
LYMPHS PCT: 9 % — AB (ref 12–46)
Lymphs Abs: 1.3 10*3/uL (ref 0.7–4.0)
MCH: 30.1 pg (ref 26.0–34.0)
MCHC: 33.1 g/dL (ref 30.0–36.0)
MCV: 90.8 fL (ref 78.0–100.0)
Monocytes Absolute: 2.4 10*3/uL — ABNORMAL HIGH (ref 0.1–1.0)
Monocytes Relative: 17 % — ABNORMAL HIGH (ref 3–12)
Neutro Abs: 10.2 10*3/uL — ABNORMAL HIGH (ref 1.7–7.7)
Neutrophils Relative %: 74 % (ref 43–77)
Platelets: 361 10*3/uL (ref 150–400)
RBC: 3.36 MIL/uL — AB (ref 4.22–5.81)
RDW: 14.4 % (ref 11.5–15.5)
WBC: 13.9 10*3/uL — ABNORMAL HIGH (ref 4.0–10.5)

## 2013-09-17 MED ORDER — TRACE MINERALS CR-CU-F-FE-I-MN-MO-SE-ZN IV SOLN
INTRAVENOUS | Status: AC
  Administered 2013-09-17: 17:00:00 via INTRAVENOUS
  Filled 2013-09-17: qty 2400

## 2013-09-17 MED ORDER — METOPROLOL TARTRATE 12.5 MG HALF TABLET
12.5000 mg | ORAL_TABLET | Freq: Two times a day (BID) | ORAL | Status: DC
  Administered 2013-09-17 (×2): 12.5 mg via ORAL
  Filled 2013-09-17 (×6): qty 1

## 2013-09-17 MED ORDER — POTASSIUM CHLORIDE CRYS ER 20 MEQ PO TBCR
20.0000 meq | EXTENDED_RELEASE_TABLET | Freq: Once | ORAL | Status: AC
  Administered 2013-09-17: 20 meq via ORAL
  Filled 2013-09-17: qty 1

## 2013-09-17 MED ORDER — DEXTROSE 5 % IV SOLN
INTRAVENOUS | Status: DC
  Administered 2013-09-17 – 2013-09-20 (×3): via INTRAVENOUS

## 2013-09-17 MED ORDER — FAT EMULSION 20 % IV EMUL
250.0000 mL | INTRAVENOUS | Status: AC
  Administered 2013-09-17: 250 mL via INTRAVENOUS
  Filled 2013-09-17: qty 250

## 2013-09-17 NOTE — Progress Notes (Signed)
Agree with current plan.  Doing well.  HAs transfer orders.  Marta LamasJames O. Gae BonWyatt, III, MD, FACS [phone]--pager [phone]--office [facility]

## 2013-09-17 NOTE — Progress Notes (Signed)
eLink Physician-Brief Progress Note Patient Name: Pamalee LeydenChristopher D Moya DOB: 08/30/1965 MRN: [id number]  Date of Service  09/17/2013   HPI/Events of Note      eICU Interventions  Hypokalemia, repleted    Intervention Category Minor Interventions: Electrolytes abnormality - evaluation and management  MCQUAID, DOUGLAS 09/17/2013, 6:42 AM

## 2013-09-17 NOTE — Progress Notes (Signed)
Physical Therapy Treatment Patient Details Name: James Mccormick MRN: [id number] DOB: [birth year] Today's Date: 09/17/2013 Time: 4098-1191 PT Time Calculation (min): 27 min  PT Assessment / Plan / Recommendation  History of Present Illness 48 yo male smoker presented with abdominal pain from perforated gastric ulcer in setting of NSAID.  PCCM consulted to assist with septic shock and respiratory failure management.  Reintubated  1/24 and reextubated 1/26.   PT Comments   Pt continues to be mildly confused and impulsive.  Mobility continues to improve, but he is still unsteady.   Follow Up Recommendations  CIR     Does the patient have the potential to tolerate intense rehabilitation     Barriers to Discharge        Equipment Recommendations  Other (comment) (TBA)    Recommendations for Other Services Rehab consult  Frequency Min 3X/week   Progress towards PT Goals Progress towards PT goals: Progressing toward goals  Plan Current plan remains appropriate    Precautions / Restrictions Precautions Precautions: Fall Precaution Comments: tachy Required Braces or Orthoses:  (abdominal corset)   Pertinent Vitals/Pain sats in mid to upper 90's,  EHR trending up toward 158 bpm    Mobility  Bed Mobility Overal bed mobility: Needs Assistance Bed Mobility: Supine to Sit;Sit to Supine Supine to sit: Min assist Sit to supine: Min guard General bed mobility comments: cues to have him come up via elbow with momentum to decr pain in abdomen Transfers Overall transfer level: Needs assistance Transfers: Sit to/from Stand Sit to Stand: +2 safety/equipment;Min assist (from bed) Stand pivot transfers: +2 safety/equipment;Min assist General transfer comment: assist to come forward Ambulation/Gait Ambulation/Gait assistance: Min guard Ambulation Distance (Feet): 15 Feet (x2 to/from bathroom, then 80' in hall pushing IV pole) Assistive device:  (pushed IV pole) Gait Pattern/deviations:  Step-through pattern;Scissoring;Drifts right/left;Narrow base of support Gait velocity: slow Gait velocity interpretation: Below normal speed for age/gender General Gait Details: Gait limited by both SOB and EHR up to high of 158 bpm (up from 120''s and 130's    Exercises     PT Diagnosis:    PT Problem List:   PT Treatment Interventions:     PT Goals (current goals can now be found in the care plan section) Acute Rehab PT Goals Patient Stated Goal: Need to get back to work PT Goal Formulation: With patient Time For Goal Achievement: 09/25/13 Potential to Achieve Goals: Good  Visit Information  Last PT Received On: 09/17/13 Assistance Needed: +2 (For lines and due to Cdiff) History of Present Illness: 48 yo male smoker presented with abdominal pain from perforated gastric ulcer in setting of NSAID.  PCCM consulted to assist with septic shock and respiratory failure management.  Reintubated  1/24 and reextubated 1/26.    Subjective Data  Subjective: pt asked why his diarrhea came back Patient Stated Goal: Need to get back to work   Cognition  Cognition Arousal/Alertness: Awake/alert Behavior During Therapy: WFL for tasks assessed/performed Overall Cognitive Status: Within Functional Limits for tasks assessed (mild confusion and impulsiveness)    Balance  Balance Overall balance assessment: Needs assistance Sitting-balance support: Single extremity supported;Feet supported Sitting balance-Leahy Scale: Good Standing balance support: Single extremity supported;During functional activity Standing balance-Leahy Scale: Good  End of Session PT - End of Session Activity Tolerance: Patient tolerated treatment well;Other (comment);Patient limited by fatigue (but Tachy HR and SOB limited what could be done) Patient left: in chair;with call bell/phone within reach;with nursing/sitter in room Nurse Communication: Mobility status  GP     Noni Stonesifer, Eliseo GumKenneth V 09/17/2013, 4:25  PM 09/17/2013  [place] BingKen Chestina Kom[place], PT [phone]  (pager)

## 2013-09-17 NOTE — Progress Notes (Signed)
[facility]     Name: James Mccormick MRN: [id number] DOB: [birth year]    ADMISSION DATE:  08/28/2013 CONSULTATION DATE:  08/28/13 LOS 20 days  REFERRING MD :  Andrey Campanile PRIMARY SERVICE: [facility]  BRIEF PATIENT DESCRIPTION:  48 yo male smoker presented with abdominal pain from perforated gastric ulcer in setting of NSAID.  PCCM consulted to assist with septic shock and respiratory failure management.  Required reintubation 1/24.   SIGNIFICANT EVENTS: 1/8: Exploratory laparotomy, debridement of necrotic stomach with scissors, primary repair of perforated stomach in 2 layers.  Septic shock, VDRF, a fib post-op. 1/9: Cardiology consulted . ECHO EF 45% 1/11: still pressor dependent, NSR for > 24 hrs. Amiodarone stopped.  1/12: remain son levophed,. Left radial a line dc'ed 09/02/13: off levophed. Encephalopathic - randomly agitated. Mmit 08/28/2013 . TPN continuesoving around, biting tube, looks randomly, does not track. Failed SBT. Febrile. + 24 Liters. Back on neo 09/03/13: ARDS Protocol with 48h nimbex started. Left radial artery occluded - seen by [facility]. Expectant mgmt. 1/15: 50% fio2 on ARDS protocol with nimbex. Left radial artery occlusion events noted. 1/17 off paralytics 1/19 Extubated 1/20 Precedex for agitation 1/20 Speech therapy swallow eval; d/c solu cortef 1/21 Diarrhea, increased WBC 1/22 Trial on BiPAP 1/24 reintubated   STUDIES:  1/8 CT abd/pelvis >> pneumoperitoneum, Lt kidney stone, diverticulosis 1/9 Echo >> mild LVH, EF 40 to 45%, grade 1 diastolic dysfx 1/14 Lt upper ext arterial duplex >> no evidence for thrombosis in Lt arm 1/16 CT abd/pelvis >> b/l pleural effusions, areas of GGO and consolidation, no leak from stomach or small bowel  LINES / TUBES: 1/8 ETT >> 1/19 1/24 ETT >> 1/26 1/8 Rt IJ CVL >> 1/20 1/8 Lt radial aline >>>1/12 1/14 Rt radial aline>>1/19 1/20 Rt PICC >>  CULTURES: Blood 1/9 >> negative Tracheal aspirate 1/16 >> negative C diff 1/21 >>POS    ANTIBIOTICS: Vancomycin 1/8 >>>1/20 Zosyn 1/8 >>>>1/14 Micafungin 1/9 >>>1/21 Imipenem 1/14 >>> 1/23 Flagyl 1/21>>> Vancomycin (oral) 1/21>>>  SUBJECTIVE:     VITAL SIGNS: Temp:  [98.4 F (36.9 C)-99.6 F (37.6 C)] 98.4 F (36.9 C) (01/28 0802) Pulse Rate:  [104-144] 119 (01/28 0200) Resp:  [22-32] 25 (01/28 0700) BP: (136-183)/(82-113) 172/104 mmHg (01/28 0800) SpO2:  [94 %-98 %] 97 % (01/28 0811) Weight:  [97.9 kg (215 lb 13.3 oz)] 97.9 kg (215 lb 13.3 oz) (01/28 0500) Room air  INTAKE / OUTPUT: Intake/Output     01/27 0701 - 01/28 0700 01/28 0701 - 01/29 0700   I.V. (mL/kg) 1020 (10.4)    NG/GT     IV Piggyback 300    TPN 2640    Total Intake(mL/kg) 3960 (40.4)    Urine (mL/kg/hr) 845 (0.4) 425 (2)   Drains 21 (0)    Total Output 866 425   Net +3094 -425        Urine Occurrence 9 x    Stool Occurrence 6 x      PHYSICAL EXAMINATION: General: no distress  Neuro: awake, alert, follows commands, able to read clock, oriented to time/place HEENT: OP clear, no lesions Cardiovascular: regular Lungs: no wheeze, decreased at bases, strong cough Abdomen: wound dressing w some purulence, also coming out of his JP drains Musculoskeletal: ankle edema Skin: area of ecchymosis on Lt forearm  LABS: PULMONARY  Recent Labs Lab 09/13/13 1402 09/13/13 1415 09/13/13 1635  PHART 7.222* 7.180* 7.284*  PCO2ART 69.2* 75.9* 55.3*  PO2ART 60.0* 84.0 272.0*  HCO3 28.4* 28.3* 26.2*  TCO2 31 31 28   O2SAT 84.0 92.0 100.0   CBC  Recent Labs Lab 09/15/13 0400 09/16/13 0849 09/17/13 0415  HGB 10.5* 10.4* 10.1*  HCT 33.7* 32.4* 30.5*  WBC 15.0* 14.4* 13.9*  PLT 388 388 361   CARDIAC   No results found for this basename: TROPONINI,  in the last 168 hours No results found for this basename: PROBNP,  in the last 168 hours CHEMISTRY  Recent Labs Lab 09/11/13 0407  09/13/13 0352 09/14/13 0400 09/14/13 1830 09/15/13 0400 09/16/13 0849 09/17/13 0415  NA 148*   < > 148* 152* 151* 153* 149* 149*  K 3.4*  < > 3.6* 3.9 4.3 4.1 3.6* 3.4*  CL 103  < > 109 116* 116* 117* 115* 116*  CO2 29  < > 25 23 24 22 21 19   GLUCOSE 152*  < > 149* 137* 124* 128* 123* 124*  BUN 63*  < > 58* 69* 61* 56* 46* 42*  CREATININE 1.92*  < > 1.81* 2.14* 1.89* 1.73* 1.60* 1.69*  CALCIUM 8.0*  < > 7.8* 7.7* 7.7* 7.6* 7.5* 7.5*  MG 2.6*  --  2.6* 2.8*  --  2.3  --   --   PHOS 4.6  --  5.6* 5.6*  --  3.7  --   --   < > = values in this interval not displayed. Estimated Creatinine Clearance: 63 ml/min (by C-G formula based on Cr of 1.69).  LIVER  Recent Labs Lab 09/11/13 0407 09/15/13 0400  AST 33 12  ALT 25 10  ALKPHOS 178* 81  BILITOT 0.8 0.4  PROT 7.0 6.4  ALBUMIN 2.0* 1.8*   ENDOCRINE CBG (last 3)   Recent Labs  09/16/13 0827 09/16/13 1139 09/16/13 2313  GLUCAP 121* 111* 130*   IMAGING x48h  No results found. ASSESSMENT / PLAN:  PULMONARY A:  Acute respiratory failure with ALI in setting of peritonitis >> required reintubation 1/24 due to secretion management and then hypercapneic failure B/L small pleural effusions with passive atelectasis  Hx of smoking, likely COPD >> had some air trapping on vent P: -ct scheduled BD's  -pulm hygiene  CARDIAC A:  Septic shock 2nd to peritonitis >> resolved. A fib with RVR >>resolved. Was on IV amiodarone 1/9 to 1/10. Acute systolic heart failure likely 2nd to sepsis. NSTEMI from demand ischemia. Hx of HTN with chronic diastolic dysfx. P:  -prn hydralazine, labetalol  -started metoprolol PO 1/28 -will need cardiac evaluation and f/u Echo at some point, will order this week if continues to progress  RENAL A:   Acute kidney injury 2nd to hypovolemia, and ATN in setting of shock >> improving. Metabolic acidosis >> resolved. Hypernatremia 2nd to diuresis. Hypokalemia. Hypervolemia >> improving. P:   -f/u renal fx, urine outpt -f/u and replace electrolytes as needed -change IVF to D5W at 50cc/h,  decrease on 1/29 -held lasix 1/23, correct free water deficit through his TPN and PO water (he was cleared for PO by [facility] post-gastric repair)  GASTROINTESTINAL A:   Perforated gastric ulcer in setting of NSAID use as outpt. Protein calorie malnutrition. Dypshagia. P:   -Post -op care, wound care, drains per [facility] -Protonix BID -OK'd for diet-transition off TNA once PO intake improves   HEMATOLOGIC A:   Anemia of critical illness Low platelet 2nd to sepsis; HIT negative 1/13 >> resolved. P:  -f/u CBC intermittently -SQ lovenox for DVT prevention.  INFECTIOUS A:  Septic shock 2nd to peritonitis. Recurrent fever with possible HCAP  1/14.  Diarrhea, increased WBC 1/21 2nd to C diff >> improving. P:   -IV flagyl + po vancomycin for C diff day #8 of 14 -no clear indication for repeat CT scan abd at this time  ENDOCRINE A:   Hyperglycemia. Relative adrenal insufficiency in setting of sepsis >> cortisol 14.7 from 1/17  >> resolved. P:   -SSI   NEUROLOGIC A:   Acute encephalopathy 2nd to septic shock >> improved. Deconditioning. delerium - multifactorial-  critical illness, organ failure, hyperNa P:  Haldol prn Ativan + zoloft restarted by surgery-precedex had worked well earlier [employer] - Delerium main issue now- needs aggressive PT & CIR consult OK to transfer to SDU from CCM standpoint  PCCM will sign off. Please call if we can assist.   Levy Pupa, MD, PhD 09/17/2013, 9:20 AM [facility] [phone] or if no answer [phone]

## 2013-09-17 NOTE — Progress Notes (Addendum)
PARENTERAL NUTRITION CONSULT NOTE - FOLLOW UP  Pharmacy Consult for TPN Indication: Prolonged ileus  No Known Allergies  Patient Measurements: Height: 5\' 11"  (180.3 cm) Weight: 215 lb 13.3 oz (97.9 kg) IBW/kg (Calculated) : 75.3 Vital Signs: Temp: 98.4 F (36.9 C) (01/28 0802) Temp src: Oral (01/28 0802) BP: 179/113 mmHg (01/28 0700) Pulse Rate: 119 (01/28 0200) Intake/Output from previous day: 01/27 0701 - 01/28 0700 In: 3960 [I.V.:1020; IV Piggyback:300; TPN:2640] Out: 866 [Urine:845; Drains:21] Intake/Output from this shift:    Labs:  Recent Labs  09/15/13 0400 09/16/13 0849 09/17/13 0415  WBC 15.0* 14.4* 13.9*  HGB 10.5* 10.4* 10.1*  HCT 33.7* 32.4* 30.5*  PLT 388 388 361    Recent Labs  09/15/13 0400 09/16/13 0849 09/17/13 0415  NA 153* 149* 149*  K 4.1 3.6* 3.4*  CL 117* 115* 116*  CO2 22 21 19   GLUCOSE 128* 123* 124*  BUN 56* 46* 42*  CREATININE 1.73* 1.60* 1.69*  CALCIUM 7.6* 7.5* 7.5*  MG 2.3  --   --   PHOS 3.7  --   --   PROT 6.4  --   --   ALBUMIN 1.8*  --   --   AST 12  --   --   ALT 10  --   --   ALKPHOS 81  --   --   BILITOT 0.4  --   --   PREALBUMIN 10.7*  --   --   TRIG 110  --   --    Estimated Creatinine Clearance: 63 ml/min (by C-G formula based on Cr of 1.69).    Recent Labs  09/16/13 0827 09/16/13 1139 09/16/13 2313  GLUCAP 121* 111* 130*   Insulin Requirements in the past 24 hours:   24 units insulin in TPN + 4 units from SSI  Current Nutrition:  Clinimix E 5/15 at 178mL/hr + lipid emulsion 20% at 21mL/hr  Nutritional Goals:  Goal 2250-2450 kcal and 150-160 gm protein per RD 09/15/13  Assessment:  48 y.o. male presented to ED 1/8 with abd pain. CT consistent with perforated stomach- ulcer due to NSAID use, pneumoperitoneum, peritonitis. Taken emergently to OR where he underwent exp lap, debridement of necrotic stomach, repair of perforated stomach. Contrast study showed no leak from stomach or small bowel.  Off  Pivot TF since 1/19.  GI: Pt on clear liquid diet. Prealbumin 10.7 -remains low but trending up. Noted PICC line pulled 1/27 afternoon and D10 hung - PICC replaced later the same day.  Endo: No h/o DM. CBGs are at goal on SSI + insulin in TPN  Lytes: Na 149, Corr Ca 9.3, K 3.4 - MD replaced. Currently with no lytes in TPN.  Renal: Scr 1.69- trending down. I/O not accurate - UOP/stool o/p not recorded in ml.  Pulm: RA  Cards: S/p cardiac arrest with CPR 1/24 until ROSC. BP elevated, HR 100-144. Prn metoprolol, labetalol, hydrazaline.  Hepatobil: LFTs wnl. Trig down to 110.  Neuro: on sertraline.  ID: CDiff PCR positive 1/21- IV Flagyl and PO vancomycin. Afeb. WBC down to 13.9.  Vanc 1/8>>1/21 Zosyn 1/8>>1/14 Micafungin 1/9>>1/21 Primaxin 1/14>>1/23 Vanc PO 1/21>>2/4 Flagyl 1/21>>2/4  Best Practices: lovenox, PPI IV, MC  TPN Access: triple lumen PICC 1/20; replaced 1/27  TPN day#: 19  Plan:  1. Change Clinimix to E 5/15 (with electrolytes) at 178mL/hr and lipid emulsion 20% at 61mL/hr (provides 120gm protein/day - 80% goal + 2184 kcal/day - 97% goal). Unable to meet full nutrition  goals due to limitations of pre-mix clinimix bags 2. Continue multivitamin and full trace elements in TPN 3. Continue 24 units regular insulin in TPN 4. F/u labs in a.m.  5. F/u diet tolerance and ability to advance.  James Mccormick, PharmD, BCPS Clinical pharmacist, pager [phone]  09/17/2013 8:08 AM

## 2013-09-17 NOTE — Progress Notes (Signed)
Patient ID: James Mccormick, male   DOB: 1966/04/09, 48 y.o.   MRN: [id number]  Subjective: Still having diarrhea.  Pt sitting in bed using IS without verbal command.  Foley out, voiding without difficulties.    Objective:  Vital signs:  Filed Vitals:   09/17/13 0600 09/17/13 0700 09/17/13 0802 09/17/13 0811  BP: 166/109 179/113    Pulse:      Temp:   98.4 F (36.9 C)   TempSrc:   Oral   Resp: 23 25    Height:      Weight:      SpO2:    97%    Last BM Date: 09/16/13  Intake/Output   Yesterday:  01/27 0701 - 01/28 0700 In: 0301 [I.V.:1020; IV Piggyback:300; TPN:2640] Out: 866 [Urine:845; Drains:21] This shift:    I/O last 3 completed shifts: In: 5560 [I.V.:1210; IV Piggyback:500] Out: 2896 [Urine:2875; Drains:21] Total I/O In: -  Out: 425 [Urine:425]   Physical Exam: General: Pt awake/alert/oriented and in no acute distress Chest: few scattered rhonchi.   No chest wall pain w good excursion CV:  Pulses intact.  Regular rhythm MS: Normal AROM mjr joints.  No obvious deformity Abdomen: Soft.  Nondistended.  Midline dressing is c/d/i No evidence of peritonitis.  No incarcerated hernias. Drain with mercky thick output.  Ext:  SCDs BLE.  No mjr edema.  No cyanosis Skin: No petechiae / purpura   Problem List:   Principal Problem:   Perforated stomach Active Problems:   Septic shock(785.52)   Severe sepsis(995.92)   ARDS (adult respiratory distress syndrome)   Hypocalcemia   Hypophosphatemia   Protein-calorie malnutrition, moderate   Atrial fibrillation   Pleural effusion   C. difficile colitis    Results:   Labs: Results for orders placed during the hospital encounter of 08/28/13 (from the past 48 hour(s))  GLUCOSE, CAPILLARY     Status: Abnormal   Collection Time    09/15/13  3:18 PM      Result Value Range   Glucose-Capillary 120 (*) 70 - 99 mg/dL   Comment 1 Documented in Chart     Comment 2 Notify RN    GLUCOSE, CAPILLARY     Status: Abnormal    Collection Time    09/15/13  7:41 PM      Result Value Range   Glucose-Capillary 110 (*) 70 - 99 mg/dL   Comment 1 Documented in Chart     Comment 2 Notify RN    GLUCOSE, CAPILLARY     Status: Abnormal   Collection Time    09/16/13 12:09 AM      Result Value Range   Glucose-Capillary 111 (*) 70 - 99 mg/dL   Comment 1 Documented in Chart     Comment 2 Notify RN    GLUCOSE, CAPILLARY     Status: Abnormal   Collection Time    09/16/13  3:45 AM      Result Value Range   Glucose-Capillary 118 (*) 70 - 99 mg/dL   Comment 1 Notify RN     Comment 2 Documented in Chart    GLUCOSE, CAPILLARY     Status: Abnormal   Collection Time    09/16/13  8:27 AM      Result Value Range   Glucose-Capillary 121 (*) 70 - 99 mg/dL   Comment 1 Documented in Chart     Comment 2 Notify RN    CBC WITH DIFFERENTIAL     Status: Abnormal  Collection Time    09/16/13  8:49 AM      Result Value Range   WBC 14.4 (*) 4.0 - 10.5 K/uL   RBC 3.50 (*) 4.22 - 5.81 MIL/uL   Hemoglobin 10.4 (*) 13.0 - 17.0 g/dL   HCT 32.4 (*) 39.0 - 52.0 %   MCV 92.6  78.0 - 100.0 fL   MCH 29.7  26.0 - 34.0 pg   MCHC 32.1  30.0 - 36.0 g/dL   RDW 14.4  11.5 - 15.5 %   Platelets 388  150 - 400 K/uL   Neutrophils Relative % 75  43 - 77 %   Lymphocytes Relative 12  12 - 46 %   Monocytes Relative 12  3 - 12 %   Eosinophils Relative 1  0 - 5 %   Basophils Relative 0  0 - 1 %   Neutro Abs 10.9 (*) 1.7 - 7.7 K/uL   Lymphs Abs 1.7  0.7 - 4.0 K/uL   Monocytes Absolute 1.7 (*) 0.1 - 1.0 K/uL   Eosinophils Absolute 0.1  0.0 - 0.7 K/uL   Basophils Absolute 0.0  0.0 - 0.1 K/uL   WBC Morphology MILD LEFT SHIFT (1-5% METAS, OCC MYELO, OCC BANDS)    BASIC METABOLIC PANEL     Status: Abnormal   Collection Time    09/16/13  8:49 AM      Result Value Range   Sodium 149 (*) 137 - 147 mEq/L   Potassium 3.6 (*) 3.7 - 5.3 mEq/L   Chloride 115 (*) 96 - 112 mEq/L   CO2 21  19 - 32 mEq/L   Glucose, Bld 123 (*) 70 - 99 mg/dL   BUN 46 (*) 6  - 23 mg/dL   Creatinine, Ser 1.60 (*) 0.50 - 1.35 mg/dL   Calcium 7.5 (*) 8.4 - 10.5 mg/dL   GFR calc non Af Amer 49 (*) >90 mL/min   GFR calc Af Amer 57 (*) >90 mL/min   Comment: (NOTE)     The eGFR has been calculated using the CKD EPI equation.     This calculation has not been validated in all clinical situations.     eGFR's persistently <90 mL/min signify possible Chronic Kidney     Disease.  GLUCOSE, CAPILLARY     Status: Abnormal   Collection Time    09/16/13 11:39 AM      Result Value Range   Glucose-Capillary 111 (*) 70 - 99 mg/dL   Comment 1 Documented in Chart     Comment 2 Notify RN    GLUCOSE, CAPILLARY     Status: Abnormal   Collection Time    09/16/13 11:13 PM      Result Value Range   Glucose-Capillary 130 (*) 70 - 99 mg/dL   Comment 1 Documented in Chart     Comment 2 Notify RN    BASIC METABOLIC PANEL     Status: Abnormal   Collection Time    09/17/13  4:15 AM      Result Value Range   Sodium 149 (*) 137 - 147 mEq/L   Potassium 3.4 (*) 3.7 - 5.3 mEq/L   Chloride 116 (*) 96 - 112 mEq/L   CO2 19  19 - 32 mEq/L   Glucose, Bld 124 (*) 70 - 99 mg/dL   BUN 42 (*) 6 - 23 mg/dL   Creatinine, Ser 1.69 (*) 0.50 - 1.35 mg/dL   Calcium 7.5 (*) 8.4 - 10.5 mg/dL   GFR  calc non Af Amer 46 (*) >90 mL/min   GFR calc Af Amer 53 (*) >90 mL/min   Comment: (NOTE)     The eGFR has been calculated using the CKD EPI equation.     This calculation has not been validated in all clinical situations.     eGFR's persistently <90 mL/min signify possible Chronic Kidney     Disease.  CBC WITH DIFFERENTIAL     Status: Abnormal   Collection Time    09/17/13  4:15 AM      Result Value Range   WBC 13.9 (*) 4.0 - 10.5 K/uL   RBC 3.36 (*) 4.22 - 5.81 MIL/uL   Hemoglobin 10.1 (*) 13.0 - 17.0 g/dL   HCT 30.5 (*) 39.0 - 52.0 %   MCV 90.8  78.0 - 100.0 fL   MCH 30.1  26.0 - 34.0 pg   MCHC 33.1  30.0 - 36.0 g/dL   RDW 14.4  11.5 - 15.5 %   Platelets 361  150 - 400 K/uL   Neutrophils  Relative % 74  43 - 77 %   Lymphocytes Relative 9 (*) 12 - 46 %   Monocytes Relative 17 (*) 3 - 12 %   Eosinophils Relative 0  0 - 5 %   Basophils Relative 0  0 - 1 %   Neutro Abs 10.2 (*) 1.7 - 7.7 K/uL   Lymphs Abs 1.3  0.7 - 4.0 K/uL   Monocytes Absolute 2.4 (*) 0.1 - 1.0 K/uL   Eosinophils Absolute 0.0  0.0 - 0.7 K/uL   Basophils Absolute 0.0  0.0 - 0.1 K/uL   WBC Morphology MILD LEFT SHIFT (1-5% METAS, OCC MYELO, OCC BANDS)      Imaging / Studies: No results found.  Medications / Allergies: per chart  Antibiotics: Anti-infectives   Start     Dose/Rate Route Frequency Ordered Stop   09/10/13 2200  metroNIDAZOLE (FLAGYL) IVPB 500 mg     500 mg 100 mL/hr over 60 Minutes Intravenous 3 times per day 09/10/13 1802 09/24/13 1359   09/10/13 2000  vancomycin (VANCOCIN) 50 mg/mL oral solution 500 mg     500 mg Oral 4 times per day 09/10/13 1802 09/24/13 1159   09/10/13 1800  vancomycin (VANCOCIN) 50 mg/mL oral solution 500 mg  Status:  Discontinued     500 mg Oral 4 times per day 09/10/13 1432 09/10/13 1802   09/10/13 1600  metroNIDAZOLE (FLAGYL) IVPB 500 mg  Status:  Discontinued     500 mg 100 mL/hr over 60 Minutes Intravenous 3 times per day 09/10/13 1432 09/10/13 1802   09/09/13 1602  vancomycin (VANCOCIN) 1,250 mg in sodium chloride 0.9 % 250 mL IVPB  Status:  Discontinued     1,250 mg 166.7 mL/hr over 90 Minutes Intravenous Every 24 hours 09/09/13 1057 09/10/13 1432   09/06/13 1600  imipenem-cilastatin (PRIMAXIN) 500 mg in sodium chloride 0.9 % 100 mL IVPB  Status:  Discontinued     500 mg 200 mL/hr over 30 Minutes Intravenous 3 times per day 09/06/13 1310 09/12/13 1048   09/03/13 1100  imipenem-cilastatin (PRIMAXIN) 500 mg in sodium chloride 0.9 % 100 mL IVPB  Status:  Discontinued     500 mg 200 mL/hr over 30 Minutes Intravenous Every 6 hours 09/03/13 1007 09/06/13 1310   09/02/13 1300  vancomycin (VANCOCIN) IVPB 1000 mg/200 mL premix  Status:  Discontinued     1,000  mg 200 mL/hr over 60 Minutes Intravenous Every 12  hours 09/01/13 1527 09/07/13 1340   08/30/13 2000  vancomycin (VANCOCIN) IVPB 750 mg/150 ml premix  Status:  Discontinued     750 mg 150 mL/hr over 60 Minutes Intravenous Every 8 hours 08/30/13 1356 09/01/13 1527   08/29/13 2200  micafungin (MYCAMINE) 100 mg in sodium chloride 0.9 % 100 mL IVPB  Status:  Discontinued     100 mg 100 mL/hr over 1 Hours Intravenous Every 24 hours 08/29/13 2036 09/10/13 1432   08/29/13 0400  vancomycin (VANCOCIN) IVPB 1000 mg/200 mL premix  Status:  Discontinued     1,000 mg 200 mL/hr over 60 Minutes Intravenous Every 8 hours 08/28/13 2358 08/30/13 1356   08/29/13 0400  piperacillin-tazobactam (ZOSYN) IVPB 3.375 g  Status:  Discontinued     3.375 g 12.5 mL/hr over 240 Minutes Intravenous 3 times per day 08/28/13 2358 09/03/13 1002   08/28/13 1845  [MAR Hold]  vancomycin (VANCOCIN) IVPB 1000 mg/200 mL premix     (On MAR Hold since 08/28/13 1957)   1,000 mg 200 mL/hr over 60 Minutes Intravenous  Once 08/28/13 1831 08/28/13 1955   08/28/13 1845  [MAR Hold]  piperacillin-tazobactam (ZOSYN) IVPB 3.375 g     (On MAR Hold since 08/28/13 1957)   3.375 g 100 mL/hr over 30 Minutes Intravenous  Once 08/28/13 1831 08/28/13 2056      Assessment/Plan Acute respiratory failure-resolved Hx HTN-running high, will start po metoprolol.  Hold home Valsartan given renal insufficiency NSTEMI-ischemia demand, stable cards follow up Acute systolic heart failure-echo 08/29/13 with diffuse hypokinesis with EF 40-45%, poor study, recommend repeat.  Will need cardiology follow up. Septic shock 2/2 peritonitis-resolved  AKI-sCr slightly up today 1.6-->1.69.  Monitor I&Os Acute encephalopathy-improving  Perforated gastric ulcer s/p exploratory laparotomy Dr. Redmond Pulling 08/28/13 POD#20 -advance to full liquid diet -continue drain care, flush BID -mobilize, IS -BID wet to dry dressing changes, apply abdominal binder -VTE prophylaxis,  lovenox, SCDs PCM -continue TPN until he is tolerating orals Deconditioning-PT-OT eval and treat Anemia of critical illness-stable Hypokalemia-supplement as needed C. Diff colitis -PO vanc(D#7 and IV flagyl(D#7)  Erby Pian, [facility] Surgery Pager [phone] Office [phone]  09/17/2013 8:49 AM

## 2013-09-18 DIAGNOSIS — I509 Heart failure, unspecified: Secondary | ICD-10-CM

## 2013-09-18 LAB — COMPREHENSIVE METABOLIC PANEL
ALBUMIN: 1.8 g/dL — AB (ref 3.5–5.2)
ALT: 10 U/L (ref 0–53)
ALT: 13 U/L (ref 0–53)
AST: 16 U/L (ref 0–37)
AST: 16 U/L (ref 0–37)
Albumin: 2 g/dL — ABNORMAL LOW (ref 3.5–5.2)
Alkaline Phosphatase: 83 U/L (ref 39–117)
Alkaline Phosphatase: 88 U/L (ref 39–117)
BUN: 36 mg/dL — AB (ref 6–23)
BUN: 38 mg/dL — ABNORMAL HIGH (ref 6–23)
CALCIUM: 7.5 mg/dL — AB (ref 8.4–10.5)
CALCIUM: 7.7 mg/dL — AB (ref 8.4–10.5)
CO2: 17 mEq/L — ABNORMAL LOW (ref 19–32)
CO2: 19 meq/L (ref 19–32)
CREATININE: 1.52 mg/dL — AB (ref 0.50–1.35)
Chloride: 102 mEq/L (ref 96–112)
Chloride: 112 mEq/L (ref 96–112)
Creatinine, Ser: 1.52 mg/dL — ABNORMAL HIGH (ref 0.50–1.35)
GFR calc Af Amer: 60 mL/min — ABNORMAL LOW (ref >90)
GFR calc Af Amer: 60 mL/min — ABNORMAL LOW (ref >90)
GFR calc non Af Amer: 52 mL/min — ABNORMAL LOW (ref >90)
GFR, EST NON AFRICAN AMERICAN: 52 mL/min — AB (ref >90)
Glucose, Bld: 140 mg/dL — ABNORMAL HIGH (ref 70–99)
Potassium: 4.6 mEq/L (ref 3.7–5.3)
Potassium: 3.3 mEq/L — ABNORMAL LOW (ref 3.7–5.3)
Sodium: 134 mEq/L — ABNORMAL LOW (ref 137–147)
Sodium: 146 mEq/L (ref 137–147)
TOTAL PROTEIN: 5.9 g/dL — AB (ref 6.0–8.3)
TOTAL PROTEIN: 6.4 g/dL (ref 6.0–8.3)
Total Bilirubin: 0.4 mg/dL (ref 0.3–1.2)
Total Bilirubin: 0.4 mg/dL (ref 0.3–1.2)

## 2013-09-18 LAB — CBC
HEMATOCRIT: 30.2 % — AB (ref 39.0–52.0)
HEMOGLOBIN: 9.9 g/dL — AB (ref 13.0–17.0)
MCH: 30.6 pg (ref 26.0–34.0)
MCHC: 32.8 g/dL (ref 30.0–36.0)
MCV: 93.2 fL (ref 78.0–100.0)
Platelets: 343 10*3/uL (ref 150–400)
RBC: 3.24 MIL/uL — ABNORMAL LOW (ref 4.22–5.81)
RDW: 14.9 % (ref 11.5–15.5)
WBC: 14.7 10*3/uL — AB (ref 4.0–10.5)

## 2013-09-18 LAB — PHOSPHORUS: PHOSPHORUS: 6.1 mg/dL — AB (ref 2.3–4.6)

## 2013-09-18 LAB — GLUCOSE, CAPILLARY
GLUCOSE-CAPILLARY: 138 mg/dL — AB (ref 70–99)
Glucose-Capillary: 129 mg/dL — ABNORMAL HIGH (ref 70–99)

## 2013-09-18 LAB — MAGNESIUM: MAGNESIUM: 2 mg/dL (ref 1.5–2.5)

## 2013-09-18 MED ORDER — IPRATROPIUM-ALBUTEROL 0.5-2.5 (3) MG/3ML IN SOLN: 3.0000 mL | Freq: Four times a day (QID) | RESPIRATORY_TRACT | Status: DC | PRN

## 2013-09-18 MED ORDER — FAT EMULSION 20 % IV EMUL
250.0000 mL | INTRAVENOUS | Status: AC
  Administered 2013-09-18: 250 mL via INTRAVENOUS
  Filled 2013-09-18: qty 250

## 2013-09-18 MED ORDER — MORPHINE SULFATE 2 MG/ML IJ SOLN
2.0000 mg | INTRAMUSCULAR | Status: DC | PRN
Start: 2013-09-18 — End: 2013-09-21
  Administered 2013-09-20 – 2013-09-21 (×3): 2 mg via INTRAVENOUS
  Filled 2013-09-18 (×3): qty 1

## 2013-09-18 MED ORDER — METOPROLOL TARTRATE 25 MG PO TABS
25.0000 mg | ORAL_TABLET | Freq: Two times a day (BID) | ORAL | Status: DC
  Administered 2013-09-18 – 2013-09-19 (×3): 25 mg via ORAL
  Filled 2013-09-18 (×4): qty 1

## 2013-09-18 MED ORDER — OXYCODONE-ACETAMINOPHEN 5-325 MG PO TABS: 1.0000 | ORAL_TABLET | ORAL | Status: DC | PRN

## 2013-09-18 MED ORDER — BOOST / RESOURCE BREEZE PO LIQD
1.0000 | Freq: Three times a day (TID) | ORAL | Status: DC
  Administered 2013-09-18 – 2013-09-19 (×4): 1 via ORAL

## 2013-09-18 MED ORDER — TRACE MINERALS CR-CU-F-FE-I-MN-MO-SE-ZN IV SOLN
INTRAVENOUS | Status: AC
  Administered 2013-09-18: 17:00:00 via INTRAVENOUS
  Filled 2013-09-18: qty 2400

## 2013-09-18 MED ORDER — POTASSIUM CHLORIDE CRYS ER 20 MEQ PO TBCR
20.0000 meq | EXTENDED_RELEASE_TABLET | Freq: Once | ORAL | Status: AC
  Administered 2013-09-18: 20 meq via ORAL
  Filled 2013-09-18: qty 1

## 2013-09-18 NOTE — Plan of Care (Signed)
Problem: Phase I Progression Outcomes Goal: Patient tolerating nututrition at goal Outcome: Progressing Diet progressed to Dys 3 today and tolerating PO although appetite poor, eating 50% or less from trays, none for some meals. Will continue to encourage PO intake and resource breeze.

## 2013-09-18 NOTE — Progress Notes (Signed)
NUTRITION FOLLOW UP  Intervention:    TPN per pharmacy Resource Breeze po TID, each supplement provides 250 kcal and 9 grams of protein RD to follow for nutrition care plan  Nutrition Dx:   Inadequate oral intake now related to limited appetite as evidenced by PO intake 50%, ongoing  Goal:   Pt to meet >/= 90% of their estimated nutrition needs, met  Monitor:   TPN prescription, PO intake, weight, labs, I/O's  Assessment:   Patient with PMH of GERD, HTN presented with severe abdominal pain associated with nausea & vomiting; had been taking Alleve and large amount of Goody's powder.   CT scan revealed perforated stomach ulcer due to NSAID use.   Patient s/p procedures 1/8:  EXPLORATORY LAPAROTOMY  DEBRIDEMENT OF NECROTIC STOMACH  REPAIR OF PERFORATED STOMACH   Patient tested positive for C difficile 1/21. IV Flagyl & PO Vancomycin added.  Patient s/p FEES 1/23.  Demonstrated mild dysphagia.  Advanced to Clear Liquids 1/27, Full Liquids 1/28.  Patient's diet advanced to Dysphagia 3, thin liquids this AM.  Per RN, patient did fairly well with breakfast.  Having some episodes of delirium (pulling IVs).  Patient amenable to nutrition supplements.  Will order.  Patient receiving TPN with Clinimix E 5/15 @ 100 ml/hr and lipids @ 10 ml/hr. Provides 2184 kcal, 120 gm protein daily. Meets 99% minimum estimated energy needs and 92% minimum estimated protein needs.  Height: 08/28/13  '5\' 11"'  (1.803 m)   Weight Status:   Wt Readings from Last 1 Encounters:  09/18/13 214 lb 15.2 oz (97.5 kg)    01/28  215 lb 01/27  220 lb 01/26   220 lb 01/24   221 lb  01/23   222 lb  01/22   230 lb  01/21   233 lb  01/20   252 lb  01/19   261 lb  01/18   276 lb  01/17   278 lb  01/16   279 lb  01/12   262 lb   Admission weight: 225 lb  Re-estimated needs:  Kcal: 2250-2450 Protein: 130-140 gm Fluid: 2.2-2.4 L  Skin: abdominal surgical incision   Diet Order: Dysphagia 3, thin  liquids   Intake/Output Summary (Last 24 hours) at 09/18/13 1437 Last data filed at 09/18/13 1200  Gross per 24 hour  Intake   3810 ml  Output   2700 ml  Net   1110 ml    Labs:   Recent Labs Lab 09/14/13 0400  09/15/13 0400  09/17/13 0415 09/18/13 0413 09/18/13 0545  NA 152*  < > 153*  < > 149* 134* 146  K 3.9  < > 4.1  < > 3.4* 4.6 3.3*  CL 116*  < > 117*  < > 116* 102 112  CO2 23  < > 22  < > 19 17* 19  BUN 69*  < > 56*  < > 42* 36* 38*  CREATININE 2.14*  < > 1.73*  < > 1.69* 1.52* 1.52*  CALCIUM 7.7*  < > 7.6*  < > 7.5* 7.5* 7.7*  MG 2.8*  --  2.3  --   --  2.0  --   PHOS 5.6*  --  3.7  --   --  6.1*  --   GLUCOSE 137*  < > 128*  < > 124* NOT DONE 140*  < > = values in this interval not displayed.  CBG (last 3)   Recent Labs  09/17/13 2344 09/18/13  2575 09/18/13 1148  GLUCAP 123* 129* 138*    Scheduled Meds: . enoxaparin (LOVENOX) injection  40 mg Subcutaneous Q24H  . insulin aspart  0-15 Units Subcutaneous Q6H  . metoprolol tartrate  25 mg Oral BID  . vancomycin  500 mg Oral Q6H   And  . metronidazole  500 mg Intravenous Q8H  . pantoprazole (PROTONIX) IV  40 mg Intravenous Q12H  . sertraline  50 mg Oral TID  . sodium chloride  10-40 mL Intracatheter Q12H    Continuous Infusions: . sodium chloride 20 mL/hr at 09/16/13 1732  . dextrose 100 mL/hr at 09/16/13 1411  . dextrose 50 mL/hr at 09/18/13 0700  . Marland KitchenTPN (CLINIMIX-E) Adult 100 mL/hr at 09/17/13 1727   And  . fat emulsion 250 mL (09/17/13 1726)  . Marland KitchenTPN (CLINIMIX-E) Adult     And  . fat emulsion      Arthur Holms, RD, LDN Pager #: [phone] After-Hours Pager #: [phone]

## 2013-09-18 NOTE — Progress Notes (Signed)
I met with pt at bedside to discuss options for his rehab recovery once he is medically ready. He is agreeable for me to contact his daughter to discuss his care needs. [phone]

## 2013-09-18 NOTE — Progress Notes (Signed)
Physical Therapy Treatment Patient Details Name: James LeydenChristopher D Mccormick MRN: [id number] DOB: 02/06/1966 Today's Date: 09/18/2013 Time: [id number] PT Time Calculation (min): 23 min  PT Assessment / Plan / Recommendation  History of Present Illness 48 yo male smoker presented with abdominal pain from perforated gastric ulcer in setting of NSAID.  PCCM consulted to assist with septic shock and respiratory failure management.  Reintubated  1/24 and reextubated 1/26.   PT Comments   Pt starting to make good improvements now that s/s of cdiff resolving somewhat.  HR still tachy at 120 to 130's at rest and rising to 150's with exertion.  Dyspnea continues, but much less limiting.   Follow Up Recommendations  CIR     Does the patient have the potential to tolerate intense rehabilitation     Barriers to Discharge        Equipment Recommendations  Other (comment) (TBA hopefully will ultimately need no equip.)    Recommendations for Other Services Rehab consult  Frequency Min 3X/week   Progress towards PT Goals Progress towards PT goals: Progressing toward goals  Plan Current plan remains appropriate    Precautions / Restrictions Precautions Precautions: Fall Precaution Comments: tachycardic Required Braces or Orthoses:  (abdominal binder) Restrictions Weight Bearing Restrictions: No   Pertinent Vitals/Pain See above     Mobility  Bed Mobility Overal bed mobility: Needs Assistance Bed Mobility: Supine to Sit Supine to sit: Min assist Sit to supine: Min guard General bed mobility comments: cues to have him come up via elbow with momentum to decr pain in abdomen Transfers Overall transfer level: Needs assistance Transfers: Sit to/from Stand Sit to Stand: Min guard;+2 safety/equipment (from bed) General transfer comment: cues to come forward Ambulation/Gait Ambulation/Gait assistance: Min guard Ambulation Distance (Feet): 170 Feet Assistive device:  (pushed IV pole) Gait  Pattern/deviations: Step-through pattern;Scissoring;Staggering left;Staggering right;Narrow base of support Gait velocity: slow General Gait Details: Mildly SOB, but not too dyspneic.  Still mildly unsteady with staggery steps.    Exercises     PT Diagnosis:    PT Problem List:   PT Treatment Interventions:     PT Goals (current goals can now be found in the care plan section) Acute Rehab PT Goals Patient Stated Goal: Need to get back to work PT Goal Formulation: With patient Time For Goal Achievement: 09/25/13 Potential to Achieve Goals: Good  Visit Information  Last PT Received On: 09/18/13 Assistance Needed: +2 (For lines ) History of Present Illness: 48 yo male smoker presented with abdominal pain from perforated gastric ulcer in setting of NSAID.  PCCM consulted to assist with septic shock and respiratory failure management.  Reintubated  1/24 and reextubated 1/26.    Subjective Data  Subjective: It's (diarrhea) starting to slow down. Patient Stated Goal: Need to get back to work   Cognition  Cognition Arousal/Alertness: Awake/alert Behavior During Therapy: WFL for tasks assessed/performed Overall Cognitive Status: Within Functional Limits for tasks assessed (mild confusion and impulsiveness)    Balance  Balance Overall balance assessment: Needs assistance Sitting-balance support: No upper extremity supported Sitting balance-Leahy Scale: Good Standing balance support: Single extremity supported Standing balance-Leahy Scale: Good (but noticeable unsteadiness) General Comments General comments (skin integrity, edema, etc.): stood at EOB for pericare  one arm support on IV pole.  End of Session PT - End of Session Activity Tolerance: Patient tolerated treatment well;Patient limited by fatigue ( ) Patient left: in chair;with call bell/phone within reach;with nursing/sitter in room Nurse Communication: Mobility status   GP  James Mccormick 09/18/2013, 11:25  AM 09/18/2013  James Mccormick, PT [phone]  (pager)

## 2013-09-18 NOTE — Progress Notes (Signed)
Patient ID: James Mccormick, male   DOB: 1966-07-01, 48 y.o.   MRN: [id number]  Subjective: diarrhea improving, tolerated full liquid diet.  Adequate urine output.    Objective:  Vital signs:  Filed Vitals:   09/18/13 0432 09/18/13 0500 09/18/13 0600 09/18/13 0700  BP:  159/90 164/93 163/95  Pulse:      Temp:      TempSrc:      Resp:  _0 Height:      Weight: 214 lb 15.2 oz (97.5 kg)     SpO2:        Last BM Date: 09/17/13  Intake/Output   Yesterday:  01/28 0701 - 01/29 0700 In: 4241.7 [P.O.:360; I.V.:931.7; IV Piggyback:300; TPN:2640] Out: 6168 [Urine:2450] This shift:    I/O last 3 completed shifts: In: 7001.7 [P.O.:360; I.V.:1511.7; Other:10; IV Piggyback:500] Out: 2780 [Urine:2770; Drains:10] Total I/O In: 170 [I.V.:50; Other:10; TPN:110] Out: -     Physical Exam:  General: Pt awake/alert/oriented and in no acute distress  Chest: few scattered rhonchi. No chest wall pain w good excursion  CV: Pulses intact. Regular rhythm. tachycardic MS: Normal AROM mjr joints. No obvious deformity  Abdomen: Soft. Nondistended. Midline wound with mostly beefy red tissue, areas of fibrinous exudate, no eschar, sutures are visible.  No incarcerated hernias. Drain with mercky thick output.  Ext: SCDs BLE. No mjr edema. No cyanosis  Skin: No petechiae / purpura    Problem List:   Principal Problem:   Perforated stomach Active Problems:   Septic shock(785.52)   Severe sepsis(995.92)   ARDS (adult respiratory distress syndrome)   Hypocalcemia   Hypophosphatemia   Protein-calorie malnutrition, moderate   Atrial fibrillation   Pleural effusion   C. difficile colitis    Results:   Labs: Results for orders placed during the hospital encounter of 08/28/13 (from the past 48 hour(s))  GLUCOSE, CAPILLARY     Status: Abnormal   Collection Time    09/16/13  8:27 AM      Result Value Range   Glucose-Capillary 121 (*) 70 - 99 mg/dL   Comment 1 Documented in Chart      Comment 2 Notify RN    CBC WITH DIFFERENTIAL     Status: Abnormal   Collection Time    09/16/13  8:49 AM      Result Value Range   WBC 14.4 (*) 4.0 - 10.5 K/uL   RBC 3.50 (*) 4.22 - 5.81 MIL/uL   Hemoglobin 10.4 (*) 13.0 - 17.0 g/dL   HCT 32.4 (*) 39.0 - 52.0 %   MCV 92.6  78.0 - 100.0 fL   MCH 29.7  26.0 - 34.0 pg   MCHC 32.1  30.0 - 36.0 g/dL   RDW 14.4  11.5 - 15.5 %   Platelets 388  150 - 400 K/uL   Neutrophils Relative % 75  43 - 77 %   Lymphocytes Relative 12  12 - 46 %   Monocytes Relative 12  3 - 12 %   Eosinophils Relative 1  0 - 5 %   Basophils Relative 0  0 - 1 %   Neutro Abs 10.9 (*) 1.7 - 7.7 K/uL   Lymphs Abs 1.7  0.7 - 4.0 K/uL   Monocytes Absolute 1.7 (*) 0.1 - 1.0 K/uL   Eosinophils Absolute 0.1  0.0 - 0.7 K/uL   Basophils Absolute 0.0  0.0 - 0.1 K/uL   WBC Morphology MILD LEFT SHIFT (1-5% METAS, OCC MYELO, OCC BANDS)  BASIC METABOLIC PANEL     Status: Abnormal   Collection Time    09/16/13  8:49 AM      Result Value Range   Sodium 149 (*) 137 - 147 mEq/L   Potassium 3.6 (*) 3.7 - 5.3 mEq/L   Chloride 115 (*) 96 - 112 mEq/L   CO2 21  19 - 32 mEq/L   Glucose, Bld 123 (*) 70 - 99 mg/dL   BUN 46 (*) 6 - 23 mg/dL   Creatinine, Ser 1.60 (*) 0.50 - 1.35 mg/dL   Calcium 7.5 (*) 8.4 - 10.5 mg/dL   GFR calc non Af Amer 49 (*) >90 mL/min   GFR calc Af Amer 57 (*) >90 mL/min   Comment: (NOTE)     The eGFR has been calculated using the CKD EPI equation.     This calculation has not been validated in all clinical situations.     eGFR's persistently <90 mL/min signify possible Chronic Kidney     Disease.  GLUCOSE, CAPILLARY     Status: Abnormal   Collection Time    09/16/13 11:39 AM      Result Value Range   Glucose-Capillary 111 (*) 70 - 99 mg/dL   Comment 1 Documented in Chart     Comment 2 Notify RN    GLUCOSE, CAPILLARY     Status: Abnormal   Collection Time    09/16/13  5:24 PM      Result Value Range   Glucose-Capillary 150 (*) 70 - 99 mg/dL    Comment 1 Documented in Chart     Comment 2 Notify RN    GLUCOSE, CAPILLARY     Status: Abnormal   Collection Time    09/16/13 11:13 PM      Result Value Range   Glucose-Capillary 130 (*) 70 - 99 mg/dL   Comment 1 Documented in Chart     Comment 2 Notify RN    BASIC METABOLIC PANEL     Status: Abnormal   Collection Time    09/17/13  4:15 AM      Result Value Range   Sodium 149 (*) 137 - 147 mEq/L   Potassium 3.4 (*) 3.7 - 5.3 mEq/L   Chloride 116 (*) 96 - 112 mEq/L   CO2 19  19 - 32 mEq/L   Glucose, Bld 124 (*) 70 - 99 mg/dL   BUN 42 (*) 6 - 23 mg/dL   Creatinine, Ser 1.69 (*) 0.50 - 1.35 mg/dL   Calcium 7.5 (*) 8.4 - 10.5 mg/dL   GFR calc non Af Amer 46 (*) >90 mL/min   GFR calc Af Amer 53 (*) >90 mL/min   Comment: (NOTE)     The eGFR has been calculated using the CKD EPI equation.     This calculation has not been validated in all clinical situations.     eGFR's persistently <90 mL/min signify possible Chronic Kidney     Disease.  CBC WITH DIFFERENTIAL     Status: Abnormal   Collection Time    09/17/13  4:15 AM      Result Value Range   WBC 13.9 (*) 4.0 - 10.5 K/uL   RBC 3.36 (*) 4.22 - 5.81 MIL/uL   Hemoglobin 10.1 (*) 13.0 - 17.0 g/dL   HCT 30.5 (*) 39.0 - 52.0 %   MCV 90.8  78.0 - 100.0 fL   MCH 30.1  26.0 - 34.0 pg   MCHC 33.1  30.0 - 36.0 g/dL  RDW 14.4  11.5 - 15.5 %   Platelets 361  150 - 400 K/uL   Neutrophils Relative % 74  43 - 77 %   Lymphocytes Relative 9 (*) 12 - 46 %   Monocytes Relative 17 (*) 3 - 12 %   Eosinophils Relative 0  0 - 5 %   Basophils Relative 0  0 - 1 %   Neutro Abs 10.2 (*) 1.7 - 7.7 K/uL   Lymphs Abs 1.3  0.7 - 4.0 K/uL   Monocytes Absolute 2.4 (*) 0.1 - 1.0 K/uL   Eosinophils Absolute 0.0  0.0 - 0.7 K/uL   Basophils Absolute 0.0  0.0 - 0.1 K/uL   WBC Morphology MILD LEFT SHIFT (1-5% METAS, OCC MYELO, OCC BANDS)    GLUCOSE, CAPILLARY     Status: Abnormal   Collection Time    09/17/13  6:03 AM      Result Value Range    Glucose-Capillary 114 (*) 70 - 99 mg/dL  GLUCOSE, CAPILLARY     Status: Abnormal   Collection Time    09/17/13  7:46 AM      Result Value Range   Glucose-Capillary 126 (*) 70 - 99 mg/dL  GLUCOSE, CAPILLARY     Status: Abnormal   Collection Time    09/17/13 11:42 AM      Result Value Range   Glucose-Capillary 138 (*) 70 - 99 mg/dL   Comment 1 Notify RN    GLUCOSE, CAPILLARY     Status: Abnormal   Collection Time    09/17/13  5:42 PM      Result Value Range   Glucose-Capillary 122 (*) 70 - 99 mg/dL   Comment 1 Notify RN    GLUCOSE, CAPILLARY     Status: Abnormal   Collection Time    09/17/13 11:44 PM      Result Value Range   Glucose-Capillary 123 (*) 70 - 99 mg/dL   Comment 1 Documented in Chart     Comment 2 Notify RN    COMPREHENSIVE METABOLIC PANEL     Status: Abnormal   Collection Time    09/18/13  4:13 AM      Result Value Range   Sodium 134 (*) 137 - 147 mEq/L   Comment: DELTA CHECK NOTED     DISREGARD ALL CMP RESULTS     QUESTIONABLE RESULTS, RECOMMEND RECOLLECT TO VERIFY     NOTIFIED DANIELS,A RN 09/18/2013     TEST WILL BE CREDITED   Potassium 4.6  3.7 - 5.3 mEq/L   Comment: DELTA CHECK NOTED     DISREGARD ALL CMP RESULTS     QUESTIONABLE RESULTS, RECOMMEND RECOLLECT TO VERIFY     NOTIFIED DANIELS,A RN 09/18/2013     TEST WILL BE CREDITED   Chloride 102  96 - 112 mEq/L   Comment: DELTA CHECK NOTED     DISREGARD ALL CMP RESULTS     QUESTIONABLE RESULTS, RECOMMEND RECOLLECT TO VERIFY     NOTIFIED DANIELS,A RN 09/18/2013     TEST WILL BE CREDITED   CO2 17 (*) 19 - 32 mEq/L   Comment: DISREGARD ALL CMP RESULTS     QUESTIONABLE RESULTS, RECOMMEND RECOLLECT TO VERIFY     NOTIFIED DANIELS,A RN 09/18/2013     TEST WILL BE CREDITED   Glucose, Bld NOT DONE  70 - 99 mg/dL   Comment: DISREGARD ALL CMP RESULTS     QUESTIONABLE RESULTS, RECOMMEND RECOLLECT TO VERIFY     NOTIFIED  DANIELS,A RN 09/18/2013     TEST WILL BE CREDITED   BUN 36 (*) 6 - 23 mg/dL   Comment:  DISREGARD ALL CMP RESULTS     QUESTIONABLE RESULTS, RECOMMEND RECOLLECT TO VERIFY     NOTIFIED DANIELS,A RN 09/18/2013   Creatinine, Ser 1.52 (*) 0.50 - 1.35 mg/dL   Comment: DISREGARD ALL CMP RESULTS     QUESTIONABLE RESULTS, RECOMMEND RECOLLECT TO VERIFY     NOTIFIED DANIELS,A RN 09/18/2013     TEST WILL BE CREDITED   Calcium 7.5 (*) 8.4 - 10.5 mg/dL   Comment: DISREGARD ALL CMP RESULTS     QUESTIONABLE RESULTS, RECOMMEND RECOLLECT TO VERIFY     NOTIFIED DANIELS,A RN 09/18/2013     TEST WILL BE CREDITED   Total Protein 5.9 (*) 6.0 - 8.3 g/dL   Comment: DISREGARD ALL CMP RESULTS     QUESTIONABLE RESULTS, RECOMMEND RECOLLECT TO VERIFY     NOTIFIED DANIELS,A RN 09/18/2013     TEST WILL BE CREDITED   Albumin 1.8 (*) 3.5 - 5.2 g/dL   Comment: DISREGARD ALL CMP RESULTS     QUESTIONABLE RESULTS, RECOMMEND RECOLLECT TO VERIFY     NOTIFIED DANIELS,A RN 09/18/2013     TEST WILL BE CREDITED   AST 16  0 - 37 U/L   Comment: DISREGARD ALL CMP RESULTS     QUESTIONABLE RESULTS, RECOMMEND RECOLLECT TO VERIFY     NOTIFIED DANIELS,A RN 09/18/2013     TEST WILL BE CREDITED   ALT 10  0 - 53 U/L   Comment: DISREGARD ALL CMP RESULTS     QUESTIONABLE RESULTS, RECOMMEND RECOLLECT TO VERIFY     NOTIFIED DANIELS,A RN 09/18/2013     TEST WILL BE CREDITED   Alkaline Phosphatase 83  39 - 117 U/L   Comment: DISREGARD ALL CMP RESULTS     QUESTIONABLE RESULTS, RECOMMEND RECOLLECT TO VERIFY     NOTIFIED DANIELS,A RN 09/18/2013     TEST WILL BE CREDITED   Total Bilirubin 0.4  0.3 - 1.2 mg/dL   Comment: DISREGARD ALL CMP RESULTS     QUESTIONABLE RESULTS, RECOMMEND RECOLLECT TO VERIFY     NOTIFIED DANIELS,A RN 09/18/2013     TEST WILL BE CREDITED   GFR calc non Af Amer 52 (*) >90 mL/min   Comment: DISREGARD ALL CMP RESULTS     QUESTIONABLE RESULTS, RECOMMEND RECOLLECT TO VERIFY     NOTIFIED DANIELS,A RN 09/18/2013   GFR calc Af Amer 60 (*) >90 mL/min   Comment: DISREGARD ALL CMP RESULTS      QUESTIONABLE RESULTS, RECOMMEND RECOLLECT TO VERIFY     NOTIFIED DANIELS,A RN 09/18/2013     (NOTE)     The eGFR has been calculated using the CKD EPI equation.     This calculation has not been validated in all clinical situations.     eGFR's persistently <90 mL/min signify possible Chronic Kidney     Disease.  MAGNESIUM     Status: None   Collection Time    09/18/13  4:13 AM      Result Value Range   Magnesium 2.0  1.5 - 2.5 mg/dL  PHOSPHORUS     Status: Abnormal   Collection Time    09/18/13  4:13 AM      Result Value Range   Phosphorus 6.1 (*) 2.3 - 4.6 mg/dL  CBC     Status: Abnormal   Collection Time    09/18/13  4:13 AM  Result Value Range   WBC 14.7 (*) 4.0 - 10.5 K/uL   RBC 3.24 (*) 4.22 - 5.81 MIL/uL   Hemoglobin 9.9 (*) 13.0 - 17.0 g/dL   HCT 30.2 (*) 39.0 - 52.0 %   MCV 93.2  78.0 - 100.0 fL   MCH 30.6  26.0 - 34.0 pg   MCHC 32.8  30.0 - 36.0 g/dL   RDW 14.9  11.5 - 15.5 %   Platelets 343  150 - 400 K/uL  COMPREHENSIVE METABOLIC PANEL     Status: Abnormal   Collection Time    09/18/13  5:45 AM      Result Value Range   Sodium 146  137 - 147 mEq/L   Comment: DELTA CHECK NOTED   Potassium 3.3 (*) 3.7 - 5.3 mEq/L   Comment: DELTA CHECK NOTED   Chloride 112  96 - 112 mEq/L   Comment: DELTA CHECK NOTED   CO2 19  19 - 32 mEq/L   Glucose, Bld 140 (*) 70 - 99 mg/dL   BUN 38 (*) 6 - 23 mg/dL   Creatinine, Ser 1.52 (*) 0.50 - 1.35 mg/dL   Calcium 7.7 (*) 8.4 - 10.5 mg/dL   Total Protein 6.4  6.0 - 8.3 g/dL   Albumin 2.0 (*) 3.5 - 5.2 g/dL   AST 16  0 - 37 U/L   ALT 13  0 - 53 U/L   Alkaline Phosphatase 88  39 - 117 U/L   Total Bilirubin 0.4  0.3 - 1.2 mg/dL   GFR calc non Af Amer 52 (*) >90 mL/min   GFR calc Af Amer 60 (*) >90 mL/min   Comment: (NOTE)     The eGFR has been calculated using the CKD EPI equation.     This calculation has not been validated in all clinical situations.     eGFR's persistently <90 mL/min signify possible Chronic Kidney      Disease.    Imaging / Studies: No results found.  Medications / Allergies: per chart  Antibiotics: Anti-infectives   Start     Dose/Rate Route Frequency Ordered Stop   09/10/13 2200  metroNIDAZOLE (FLAGYL) IVPB 500 mg     500 mg 100 mL/hr over 60 Minutes Intravenous 3 times per day 09/10/13 1802 09/24/13 1359   09/10/13 2000  vancomycin (VANCOCIN) 50 mg/mL oral solution 500 mg     500 mg Oral 4 times per day 09/10/13 1802 09/24/13 1159   09/10/13 1800  vancomycin (VANCOCIN) 50 mg/mL oral solution 500 mg  Status:  Discontinued     500 mg Oral 4 times per day 09/10/13 1432 09/10/13 1802   09/10/13 1600  metroNIDAZOLE (FLAGYL) IVPB 500 mg  Status:  Discontinued     500 mg 100 mL/hr over 60 Minutes Intravenous 3 times per day 09/10/13 1432 09/10/13 1802   09/09/13 1602  vancomycin (VANCOCIN) 1,250 mg in sodium chloride 0.9 % 250 mL IVPB  Status:  Discontinued     1,250 mg 166.7 mL/hr over 90 Minutes Intravenous Every 24 hours 09/09/13 1057 09/10/13 1432   09/06/13 1600  imipenem-cilastatin (PRIMAXIN) 500 mg in sodium chloride 0.9 % 100 mL IVPB  Status:  Discontinued     500 mg 200 mL/hr over 30 Minutes Intravenous 3 times per day 09/06/13 1310 09/12/13 1048   09/03/13 1100  imipenem-cilastatin (PRIMAXIN) 500 mg in sodium chloride 0.9 % 100 mL IVPB  Status:  Discontinued     500 mg 200 mL/hr over 30 Minutes  Intravenous Every 6 hours 09/03/13 1007 09/06/13 1310   09/02/13 1300  vancomycin (VANCOCIN) IVPB 1000 mg/200 mL premix  Status:  Discontinued     1,000 mg 200 mL/hr over 60 Minutes Intravenous Every 12 hours 09/01/13 1527 09/07/13 1340   08/30/13 2000  vancomycin (VANCOCIN) IVPB 750 mg/150 ml premix  Status:  Discontinued     750 mg 150 mL/hr over 60 Minutes Intravenous Every 8 hours 08/30/13 1356 09/01/13 1527   08/29/13 2200  micafungin (MYCAMINE) 100 mg in sodium chloride 0.9 % 100 mL IVPB  Status:  Discontinued     100 mg 100 mL/hr over 1 Hours Intravenous Every 24 hours  08/29/13 2036 09/10/13 1432   08/29/13 0400  vancomycin (VANCOCIN) IVPB 1000 mg/200 mL premix  Status:  Discontinued     1,000 mg 200 mL/hr over 60 Minutes Intravenous Every 8 hours 08/28/13 2358 08/30/13 1356   08/29/13 0400  piperacillin-tazobactam (ZOSYN) IVPB 3.375 g  Status:  Discontinued     3.375 g 12.5 mL/hr over 240 Minutes Intravenous 3 times per day 08/28/13 2358 09/03/13 1002   08/28/13 1845  [MAR Hold]  vancomycin (VANCOCIN) IVPB 1000 mg/200 mL premix     (On MAR Hold since 08/28/13 1957)   1,000 mg 200 mL/hr over 60 Minutes Intravenous  Once 08/28/13 1831 08/28/13 1955   08/28/13 1845  [MAR Hold]  piperacillin-tazobactam (ZOSYN) IVPB 3.375 g     (On MAR Hold since 08/28/13 1957)   3.375 g 100 mL/hr over 30 Minutes Intravenous  Once 08/28/13 1831 08/28/13 2056      Assessment/Plan  Acute respiratory failure-resolved  Hx HTN-remains elevated, increase metoprolol to 73m bid.  Hold Valsartan given renal insufficiency  NSTEMI-ischemia demand, stable cards follow up  Acute systolic heart failure-echo 08/29/13 with diffuse hypokinesis with EF 40-45%, poor study, recommend repeat. Will need cardiology follow up.  Septic shock 2/2 peritonitis-resolved  AKI-sCr better today 1.69--->.152. Monitor I&Os  Acute encephalopathy-improving  Perforated gastric ulcer s/p exploratory laparotomy Dr. WRedmond Pulling1/8/15  POD#21  -advance to soft diet, wean off TPN -continue drain care, flush BID  -monitor white count. ?repeat CT scan last 1/16 -mobilize, IS  -BID wet to dry dressing changes, apply abdominal binder -VTE prophylaxis, lovenox, SCDs  PCM  -wean off TPN Deconditioning-PT-OT eval and treat, recommending CIR, will place consult Anemia of critical illness-stable  Hypokalemia-supplement per pharmacy while on TPN C. Diff colitis  -PO vanc(D#8/14 and IV flagyl(D#8/14)    EErby Pian [facility] Pager [phone] [phone] 09/18/2013 8:24  AM

## 2013-09-18 NOTE — Progress Notes (Signed)
PARENTERAL NUTRITION CONSULT NOTE - FOLLOW UP  Pharmacy Consult for TPN Indication: Prolonged ileus  No Known Allergies  Patient Measurements: Height: 5\' 11"  (180.3 cm) Weight: 214 lb 15.2 oz (97.5 kg) IBW/kg (Calculated) : 75.3 Vital Signs: Temp: 98 F (36.7 C) (01/29 0400) Temp src: Oral (01/29 0400) BP: 164/93 mmHg (01/29 0600) Pulse Rate: 106 (01/28 2230) Intake/Output from previous day: 01/28 0701 - 01/29 0700 In: 4241.7 [P.O.:360; I.V.:931.7; IV Piggyback:300; TPN:2640] Out: 2450 [Urine:2450] Intake/Output from this shift:    Labs:  Recent Labs  09/16/13 0849 09/17/13 0415 09/18/13 0413  WBC 14.4* 13.9* 14.7*  HGB 10.4* 10.1* 9.9*  HCT 32.4* 30.5* 30.2*  PLT 388 361 343    Recent Labs  09/17/13 0415 09/18/13 0413 09/18/13 0545  NA 149* 134* 146  K 3.4* 4.6 3.3*  CL 116* 102 112  CO2 19 17* 19  GLUCOSE 124* NOT DONE 140*  BUN 42* 36* 38*  CREATININE 1.69* 1.52* 1.52*  CALCIUM 7.5* 7.5* 7.7*  MG  --  2.0  --   PHOS  --  6.1*  --   PROT  --  5.9* 6.4  ALBUMIN  --  1.8* 2.0*  AST  --  16 16  ALT  --  10 13  ALKPHOS  --  83 88  BILITOT  --  0.4 0.4   Estimated Creatinine Clearance: 70 ml/min (by C-G formula based on Cr of 1.52).    Recent Labs  09/17/13 1142 09/17/13 1742 09/17/13 2344  GLUCAP 138* 122* 123*   Insulin Requirements in the past 24 hours:   24 units insulin in TPN + 8 units from SSI  Current Nutrition:  Clinimix E 5/15 at 165mL/hr + lipid emulsion 20% at 65mL/hr  Nutritional Goals:  Goal 2250-2450 kcal and 150-160 gm protein per RD 09/15/13  Assessment:  48 y.o. male presented to ED 1/8 with abd pain. CT consistent with perforated stomach- ulcer due to NSAID use, pneumoperitoneum, peritonitis. Taken emergently to OR where he underwent exp lap, debridement of necrotic stomach, repair of perforated stomach. Contrast study showed no leak from stomach or small bowel.  Off Pivot TF since 1/19.  GI: Pt advanced to full liquid  diet. Prealbumin 10.7 -remains low but trending up. Noted PICC line pulled 1/27 afternoon and D10 hung - PICC replaced later the same day.  Endo: No h/o DM. CBGs are at goal on SSI + insulin in TPN  Lytes: Initial labs drawn this am were not drawn correctly.  Second set of labs Na 146, K 3.3, Ca 7.7, Corrected Ca 9.3  Renal: Scr 1.52    trending down. I/O not accurate - UOP/stool o/p not recorded in ml.  Pulm: RA  Cards: S/p cardiac arrest with CPR 1/24 until ROSC. BP elevated, HR 106. Prn metoprolol, labetalol, hydrazaline.  Hepatobil: LFTs wnl. Trig down to 110.  Neuro: on sertraline.  ID: CDiff PCR positive 1/21- IV Flagyl and PO vancomycin. Afeb. WBC 14.7  Vanc 1/8>>1/21 Zosyn 1/8>>1/14 Micafungin 1/9>>1/21 Primaxin 1/14>>1/23 Vanc PO 1/21>>2/4 Flagyl 1/21>>2/4  Best Practices: lovenox, PPI IV, MC  TPN Access: triple lumen PICC 1/20; replaced 1/27  TPN day#: 20  Plan:  1. Cont Clinimix  E 5/15 (with electrolytes) at 148mL/hr and lipid emulsion 20% at 69mL/hr (provides 120gm protein/day - 80% goal + 2184 kcal/day - 97% goal). Unable to meet full nutrition goals due to limitations of pre-mix clinimix bags 2. Continue multivitamin and full trace elements in TPN 3. Continue 24  units regular insulin in TPN 4.  Will give Kcl 20 mEq X 1 po 5. F/u labs in a.m.  6. F/u diet tolerance and ability to advance.  Talbert CageLora Novelle Addair, PharmD Clinical pharmacist, [phone]  09/18/2013 7:46 AM

## 2013-09-18 NOTE — Progress Notes (Signed)
[name] is stable with pus still drainaing from [name].  Will follow.  James LamasJames O. Gae BonWyatt, III, MD, FACS [phone]--pager [phone]--office [facility]

## 2013-09-19 LAB — GLUCOSE, CAPILLARY
GLUCOSE-CAPILLARY: 152 mg/dL — AB (ref 70–99)
Glucose-Capillary: 110 mg/dL — ABNORMAL HIGH (ref 70–99)
Glucose-Capillary: 116 mg/dL — ABNORMAL HIGH (ref 70–99)
Glucose-Capillary: 118 mg/dL — ABNORMAL HIGH (ref 70–99)

## 2013-09-19 LAB — CBC
HCT: 29.8 % — ABNORMAL LOW (ref 39.0–52.0)
Hemoglobin: 9.8 g/dL — ABNORMAL LOW (ref 13.0–17.0)
MCH: 29.6 pg (ref 26.0–34.0)
MCHC: 32.9 g/dL (ref 30.0–36.0)
MCV: 90 fL (ref 78.0–100.0)
PLATELETS: 342 10*3/uL (ref 150–400)
RBC: 3.31 MIL/uL — ABNORMAL LOW (ref 4.22–5.81)
RDW: 14.6 % (ref 11.5–15.5)
WBC: 17.8 10*3/uL — ABNORMAL HIGH (ref 4.0–10.5)

## 2013-09-19 LAB — URINALYSIS, ROUTINE W REFLEX MICROSCOPIC
Bilirubin Urine: NEGATIVE
GLUCOSE, UA: NEGATIVE mg/dL
HGB URINE DIPSTICK: NEGATIVE
Ketones, ur: NEGATIVE mg/dL
Nitrite: NEGATIVE
PROTEIN: NEGATIVE mg/dL
SPECIFIC GRAVITY, URINE: 1.016 (ref 1.005–1.030)
Urobilinogen, UA: 0.2 mg/dL (ref 0.0–1.0)
pH: 7 (ref 5.0–8.0)

## 2013-09-19 LAB — BASIC METABOLIC PANEL
BUN: 41 mg/dL — AB (ref 6–23)
CALCIUM: 7.6 mg/dL — AB (ref 8.4–10.5)
CO2: 19 mEq/L (ref 19–32)
CREATININE: 1.5 mg/dL — AB (ref 0.50–1.35)
Chloride: 111 mEq/L (ref 96–112)
GFR calc Af Amer: 61 mL/min — ABNORMAL LOW (ref >90)
GFR, EST NON AFRICAN AMERICAN: 53 mL/min — AB (ref >90)
Glucose, Bld: 126 mg/dL — ABNORMAL HIGH (ref 70–99)
Potassium: 3.6 mEq/L — ABNORMAL LOW (ref 3.7–5.3)
Sodium: 144 mEq/L (ref 137–147)

## 2013-09-19 LAB — MAGNESIUM: Magnesium: 2 mg/dL (ref 1.5–2.5)

## 2013-09-19 LAB — PHOSPHORUS: PHOSPHORUS: 4.1 mg/dL (ref 2.3–4.6)

## 2013-09-19 LAB — URINE MICROSCOPIC-ADD ON

## 2013-09-19 MED ORDER — METOPROLOL TARTRATE 50 MG PO TABS
50.0000 mg | ORAL_TABLET | Freq: Two times a day (BID) | ORAL | Status: DC
  Administered 2013-09-19 – 2013-09-20 (×2): 50 mg via ORAL
  Filled 2013-09-19 (×5): qty 1

## 2013-09-19 MED ORDER — POTASSIUM CHLORIDE CRYS ER 20 MEQ PO TBCR
20.0000 meq | EXTENDED_RELEASE_TABLET | Freq: Once | ORAL | Status: AC
  Administered 2013-09-19: 20 meq via ORAL
  Filled 2013-09-19: qty 1

## 2013-09-19 MED ORDER — TRACE MINERALS CR-CU-F-FE-I-MN-MO-SE-ZN IV SOLN
INTRAVENOUS | Status: AC
  Administered 2013-09-19: 17:00:00 via INTRAVENOUS
  Filled 2013-09-19: qty 2000

## 2013-09-19 MED ORDER — FAT EMULSION 20 % IV EMUL
250.0000 mL | INTRAVENOUS | Status: AC
  Administered 2013-09-19: 250 mL via INTRAVENOUS
  Filled 2013-09-19: qty 250

## 2013-09-19 NOTE — Progress Notes (Signed)
[facility]/James Mccormick Progress Note 22 Days Post-Op  Subjective: Patient sitting up in chair.  Not eating great.  Heart rate down a bit.    Objective: Vital signs in last 24 hours: Temp:  [97.1 F (36.2 C)-100.2 F (37.9 C)] 97.1 F (36.2 C) (01/30 0812) Pulse Rate:  [94-126] 101 (01/30 0800) Resp:  [15-24] 20 (01/30 0800) BP: (140-163)/(73-120) 159/101 mmHg (01/30 0800) SpO2:  [95 %-98 %] 95 % (01/30 0800) Weight:  [98.431 kg (217 lb)] 98.431 kg (217 lb) (01/30 0500) Last BM Date: 09/17/13  Intake/Output from previous day: 01/29 0701 - 01/30 0700 In: 4737 [P.O.:477; I.V.:1220; IV Piggyback:300; TPN:2730] Out: 2288 [Urine:2250; Drains:38] Intake/Output this shift: Total I/O In: 250 [I.V.:50; TPN:200] Out: -   General: No acute distress.  Seems very appropriate.  Not sure if he is safe for the floor without a sitter.  Lungs: Clear  Abd: Soft, good bowel sounds.  Still with diarrhea  Extremities: Intact.  No calf swelling, but he states that his calves are sore.  Neuro: Intact  Lab Results:  @LABLAST2 (wbc:2,hgb:2,hct:2,plt:2) BMET  Recent Labs  09/18/13 0545 09/19/13 0350  NA 146 144  K 3.3* 3.6*  CL 112 111  CO2 19 19  GLUCOSE 140* 126*  BUN 38* 41*  CREATININE 1.52* 1.50*  CALCIUM 7.7* 7.6*   PT/INR No results found for this basename: LABPROT, INR,  in the last 72 hours ABG No results found for this basename: PHART, PCO2, PO2, HCO3,  in the last 72 hours  Studies/Results: No results found.  Anti-infectives: Anti-infectives   Start     Dose/Rate Route Frequency Ordered Stop   09/10/13 2200  metroNIDAZOLE (FLAGYL) IVPB 500 mg     500 mg 100 mL/hr over 60 Minutes Intravenous 3 times per day 09/10/13 1802 09/24/13 1359   09/10/13 2000  vancomycin (VANCOCIN) 50 mg/mL oral solution 500 mg     500 mg Oral 4 times per day 09/10/13 1802 09/24/13 1159   09/10/13 1800  vancomycin (VANCOCIN) 50 mg/mL oral solution 500 mg  Status:  Discontinued     500 mg Oral 4 times  per day 09/10/13 1432 09/10/13 1802   09/10/13 1600  metroNIDAZOLE (FLAGYL) IVPB 500 mg  Status:  Discontinued     500 mg 100 mL/hr over 60 Minutes Intravenous 3 times per day 09/10/13 1432 09/10/13 1802   09/09/13 1602  vancomycin (VANCOCIN) 1,250 mg in sodium chloride 0.9 % 250 mL IVPB  Status:  Discontinued     1,250 mg 166.7 mL/hr over 90 Minutes Intravenous Every 24 hours 09/09/13 1057 09/10/13 1432   09/06/13 1600  imipenem-cilastatin (PRIMAXIN) 500 mg in sodium chloride 0.9 % 100 mL IVPB  Status:  Discontinued     500 mg 200 mL/hr over 30 Minutes Intravenous 3 times per day 09/06/13 1310 09/12/13 1048   09/03/13 1100  imipenem-cilastatin (PRIMAXIN) 500 mg in sodium chloride 0.9 % 100 mL IVPB  Status:  Discontinued     500 mg 200 mL/hr over 30 Minutes Intravenous Every 6 hours 09/03/13 1007 09/06/13 1310   09/02/13 1300  vancomycin (VANCOCIN) IVPB 1000 mg/200 mL premix  Status:  Discontinued     1,000 mg 200 mL/hr over 60 Minutes Intravenous Every 12 hours 09/01/13 1527 09/07/13 1340   08/30/13 2000  vancomycin (VANCOCIN) IVPB 750 mg/150 ml premix  Status:  Discontinued     750 mg 150 mL/hr over 60 Minutes Intravenous Every 8 hours 08/30/13 1356 09/01/13 1527   08/29/13 2200  micafungin (  MYCAMINE) 100 mg in sodium chloride 0.9 % 100 mL IVPB  Status:  Discontinued     100 mg 100 mL/hr over 1 Hours Intravenous Every 24 hours 08/29/13 2036 09/10/13 1432   08/29/13 0400  vancomycin (VANCOCIN) IVPB 1000 mg/200 mL premix  Status:  Discontinued     1,000 mg 200 mL/hr over 60 Minutes Intravenous Every 8 hours 08/28/13 2358 08/30/13 1356   08/29/13 0400  piperacillin-tazobactam (ZOSYN) IVPB 3.375 g  Status:  Discontinued     3.375 g 12.5 mL/hr over 240 Minutes Intravenous 3 times per day 08/28/13 2358 09/03/13 1002   08/28/13 1845  [MAR Hold]  vancomycin (VANCOCIN) IVPB 1000 mg/200 mL premix     (On MAR Hold since 08/28/13 1957)   1,000 mg 200 mL/hr over 60 Minutes Intravenous  Once  08/28/13 1831 08/28/13 1955   08/28/13 1845  [MAR Hold]  piperacillin-tazobactam (ZOSYN) IVPB 3.375 g     (On MAR Hold since 08/28/13 1957)   3.375 g 100 mL/hr over 30 Minutes Intravenous  Once 08/28/13 1831 08/28/13 2056      Assessment/Plan: s/p Procedure(s): EXPLORATORY LAPAROTOMY,debridment of nacrotic stomach,and primary repair of perforated stomach. WBC is up but not quite sure why.  He seems very stable and not septic. Consider reculturing and CT No other changes.  LOS: 22 days   James Mccormick, III, MD, FACS [phone]--pager [phone]--office [facility] 09/19/2013

## 2013-09-19 NOTE — Progress Notes (Addendum)
PARENTERAL NUTRITION CONSULT NOTE - FOLLOW UP  Pharmacy Consult for TPN Indication: Prolonged ileus  No Known Allergies  Patient Measurements: Height: 5\' 11"  (180.3 cm) Weight: 217 lb (98.431 kg) IBW/kg (Calculated) : 75.3 Vital Signs: Temp: 99.4 F (37.4 C) (01/30 0355) Temp src: Oral (01/30 0355) BP: 158/95 mmHg (01/30 0600) Pulse Rate: 98 (01/30 0600) Intake/Output from previous day: 01/29 0701 - 01/30 0700 In: 4487 [P.O.:477; I.V.:1170; IV Piggyback:300; TPN:2530] Out: 2288 [Urine:2250; Drains:38] Intake/Output from this shift: Total I/O In: 1980 [I.V.:570; IV Piggyback:200; TPN:1210] Out: 843 [Urine:825; Drains:18]  Labs:  Recent Labs  09/17/13 0415 09/18/13 0413 09/19/13 0350  WBC 13.9* 14.7* 17.8*  HGB 10.1* 9.9* 9.8*  HCT 30.5* 30.2* 29.8*  PLT 361 343 342    Recent Labs  09/18/13 0413 09/18/13 0545 09/19/13 0350  NA 134* 146 144  K 4.6 3.3* 3.6*  CL 102 112 111  CO2 17* 19 19  GLUCOSE NOT DONE 140* 126*  BUN 36* 38* 41*  CREATININE 1.52* 1.52* 1.50*  CALCIUM 7.5* 7.7* 7.6*  MG 2.0  --  2.0  PHOS 6.1*  --  4.1  PROT 5.9* 6.4  --   ALBUMIN 1.8* 2.0*  --   AST 16 16  --   ALT 10 13  --   ALKPHOS 83 88  --   BILITOT 0.4 0.4  --    Estimated Creatinine Clearance: 71.2 ml/min (by C-G formula based on Cr of 1.5).    Recent Labs  09/18/13 1148 09/18/13 1728 09/19/13 0005  GLUCAP 138* 152* 110*   Insulin Requirements in the past 24 hours:   24 units insulin in TPN + 9 units from SSI  Current Nutrition:  Clinimix E 5/15 at 1300mL/hr + lipid emulsion 20% at 4010mL/hr Dys 3 diet, Resource tid  Nutritional Goals:  Goal 2250-2450 kcal and 150-160 gm protein per RD 09/15/13  Assessment:  48 y.o. male presented to ED 1/8 with abd pain. CT consistent with perforated stomach- ulcer due to NSAID use, pneumoperitoneum, peritonitis. Taken emergently to OR where he underwent exp lap, debridement of necrotic stomach, repair of perforated stomach.  Contrast study showed no leak from stomach or small bowel.   GI: Pt advanced Dys 3 diet and Resource tid. Prealbumin 10.7 -remains low but trending up.   Endo: No h/o DM. CBGs mostly at goal + insulin in TPN  Lytes: K 3.6, Phos 4.1, Mag 2.0  Renal: Scr 1.50   trending down. I/O not accurate - UOP/stool o/p not recorded in ml.  Pulm: RA  Cards: S/p cardiac arrest with CPR 1/24 until ROSC. BP elevated, HR 98. Prn metoprolol, labetalol, hydrazaline.  Hepatobil: LFTs wnl. Trig down to 110.  Neuro: on sertraline.  ID: CDiff PCR positive 1/21- IV Flagyl and PO vancomycin. Tmax 99.4. WBC 17.8  Vanc 1/8>>1/21 Zosyn 1/8>>1/14 Micafungin 1/9>>1/21 Primaxin 1/14>>1/23 Vanc PO 1/21>>2/4 Flagyl 1/21>>2/4  Best Practices: lovenox, PPI IV, MC  TPN Access: triple lumen PICC 1/20; replaced 1/27  TPN day#: 21  Plan:  1. Decrease Clinimix  E 5/15 (with electrolytes) to 80 mL/hr and cont lipid emulsion at 10 ml/hr to start to wean TPN 2. Continue multivitamin and full trace elements in TPN 3. Continue 24 units regular insulin in TPN 4.  Will give Kcl 20 mEq X 1 po 5. F/u diet tolerance and ability to advance, wean TPN  Talbert CageLora Helen Cuff, PharmD Clinical pharmacist, [phone]  09/19/2013 6:58 AM

## 2013-09-19 NOTE — Progress Notes (Signed)
Orthopedic Tech Progress Note Patient Details:  James LeydenChristopher D Mccormick 06/30/1966 [id number]  Ortho Devices Type of Ortho Device: Abdominal binder Ortho Device/Splint Interventions: James DoffingOrdered   James Mccormick 09/19/2013, 8:09 PM

## 2013-09-19 NOTE — Progress Notes (Signed)
Pt to transfer to 3S-04 via wheelchair, no O2. VS stable at time of transfer, pt hypertensive-5mg  Lopressor PRN given before transfer. Family aware of new room number, Meds and belongings at bedside. No c/o pain, no questions at this time. Transferred by 2S RN-Sam, bedside handoff to 3S RN-Morgan. James Mccormick L

## 2013-09-19 NOTE — Progress Notes (Signed)
I will continue to follow pt's progress to assist in determining if pt will need an inpt rehab stay prior to d/c home with his family. Patient making good progress with therapy but with HR tachy with activity and continued medical issues.[phone]

## 2013-09-20 ENCOUNTER — Other Ambulatory Visit (HOSPITAL_COMMUNITY): Payer: Self-pay

## 2013-09-20 ENCOUNTER — Inpatient Hospital Stay (HOSPITAL_COMMUNITY): Payer: Medicaid Other

## 2013-09-20 ENCOUNTER — Encounter (HOSPITAL_COMMUNITY): Payer: Self-pay | Admitting: *Deleted

## 2013-09-20 LAB — GLUCOSE, CAPILLARY
GLUCOSE-CAPILLARY: 111 mg/dL — AB (ref 70–99)
GLUCOSE-CAPILLARY: 127 mg/dL — AB (ref 70–99)
Glucose-Capillary: 119 mg/dL — ABNORMAL HIGH (ref 70–99)
Glucose-Capillary: 114 mg/dL — ABNORMAL HIGH (ref 70–99)

## 2013-09-20 LAB — CBC WITH DIFFERENTIAL/PLATELET
BASOS PCT: 0 % (ref 0–1)
Basophils Absolute: 0 10*3/uL (ref 0.0–0.1)
EOS ABS: 0.2 10*3/uL (ref 0.0–0.7)
EOS PCT: 1 % (ref 0–5)
HCT: 31.1 % — ABNORMAL LOW (ref 39.0–52.0)
HEMOGLOBIN: 10.4 g/dL — AB (ref 13.0–17.0)
LYMPHS ABS: 2 10*3/uL (ref 0.7–4.0)
Lymphocytes Relative: 10 % — ABNORMAL LOW (ref 12–46)
MCH: 30.1 pg (ref 26.0–34.0)
MCHC: 33.4 g/dL (ref 30.0–36.0)
MCV: 89.9 fL (ref 78.0–100.0)
Monocytes Absolute: 2.4 10*3/uL — ABNORMAL HIGH (ref 0.1–1.0)
Monocytes Relative: 12 % (ref 3–12)
NEUTROS ABS: 15.6 10*3/uL — AB (ref 1.7–7.7)
NEUTROS PCT: 77 % (ref 43–77)
Platelets: 334 10*3/uL (ref 150–400)
RBC: 3.46 MIL/uL — AB (ref 4.22–5.81)
RDW: 14.7 % (ref 11.5–15.5)
WBC Morphology: INCREASED
WBC: 20.2 10*3/uL — ABNORMAL HIGH (ref 4.0–10.5)

## 2013-09-20 LAB — URINE CULTURE
COLONY COUNT: NO GROWTH
Culture: NO GROWTH
SPECIAL REQUESTS: NORMAL

## 2013-09-20 LAB — BASIC METABOLIC PANEL
BUN: 40 mg/dL — AB (ref 6–23)
CALCIUM: 7.6 mg/dL — AB (ref 8.4–10.5)
CHLORIDE: 107 meq/L (ref 96–112)
CO2: 18 meq/L — AB (ref 19–32)
CREATININE: 1.49 mg/dL — AB (ref 0.50–1.35)
GFR calc Af Amer: 62 mL/min — ABNORMAL LOW (ref >90)
GFR calc non Af Amer: 53 mL/min — ABNORMAL LOW (ref >90)
GLUCOSE: 122 mg/dL — AB (ref 70–99)
Potassium: 4.1 mEq/L (ref 3.7–5.3)
Sodium: 139 mEq/L (ref 137–147)

## 2013-09-20 MED ORDER — FLUCONAZOLE 100MG IVPB: 100.0000 mg | INTRAVENOUS | Status: DC

## 2013-09-20 MED ORDER — BIOTENE DRY MOUTH MT LIQD
15.0000 mL | Freq: Two times a day (BID) | OROMUCOSAL | Status: DC
  Administered 2013-09-20 – 2013-10-14 (×48): 15 mL via OROMUCOSAL

## 2013-09-20 MED ORDER — IOHEXOL 300 MG/ML  SOLN
25.0000 mL | INTRAMUSCULAR | Status: AC
  Administered 2013-09-20 (×2): 25 mL via ORAL

## 2013-09-20 MED ORDER — FLUCONAZOLE IN SODIUM CHLORIDE 200-0.9 MG/100ML-% IV SOLN
200.0000 mg | INTRAVENOUS | Status: DC
  Administered 2013-09-20 – 2013-09-26 (×7): 200 mg via INTRAVENOUS
  Filled 2013-09-20 (×7): qty 100

## 2013-09-20 MED ORDER — IOHEXOL 300 MG/ML  SOLN
80.0000 mL | Freq: Once | INTRAMUSCULAR | Status: AC | PRN
  Administered 2013-09-20: 80 mL via INTRAVENOUS

## 2013-09-20 MED ORDER — TRACE MINERALS CR-CU-F-FE-I-MN-MO-SE-ZN IV SOLN
INTRAVENOUS | Status: AC
  Administered 2013-09-20: 18:00:00 via INTRAVENOUS
  Filled 2013-09-20: qty 2000

## 2013-09-20 MED ORDER — FAT EMULSION 20 % IV EMUL
250.0000 mL | INTRAVENOUS | Status: AC
  Administered 2013-09-20: 250 mL via INTRAVENOUS
  Filled 2013-09-20: qty 250

## 2013-09-20 MED ORDER — ENOXAPARIN SODIUM 40 MG/0.4ML ~~LOC~~ SOLN
40.0000 mg | SUBCUTANEOUS | Status: DC
  Filled 2013-09-20: qty 0.4

## 2013-09-20 MED ORDER — CHLORHEXIDINE GLUCONATE 0.12 % MT SOLN
15.0000 mL | Freq: Two times a day (BID) | OROMUCOSAL | Status: DC
  Administered 2013-09-20 – 2013-10-14 (×47): 15 mL via OROMUCOSAL
  Filled 2013-09-20 (×42): qty 15

## 2013-09-20 NOTE — Progress Notes (Signed)
Patient seen and examined.  Agree with PA's note. Will add antifungal coverage given the gastric perforation and character of the drainage.

## 2013-09-20 NOTE — Progress Notes (Signed)
SLP Cancellation Note  Patient Details Name: James Mccormick MRN: [id number] DOB: 04/15/1966   Cancelled treatment:   ST to sign off as patient NPO status secondary to possible gastric perforation.  Please reorder BSE when warranted prior to resuming PO's.    Moreen FowlerKaren Treana Lacour MS, CCC-SLP [phone] [facility] PavilionDANKOF,Keyetta Hollingworth 09/20/2013, 5:04 PM

## 2013-09-20 NOTE — Progress Notes (Signed)
23 Days Post-Op  Subjective: Going down for CT scan, JP's  Draining a white thick fluid.  He is alert and feels good.  Objective: Vital signs in last 24 hours: Temp:  [98.6 F (37 C)-100.1 F (37.8 C)] 98.6 F (37 C) (01/31 0815) Pulse Rate:  [91-102] 101 (01/31 0815) Resp:  [17-23] 22 (01/31 0815) BP: (146-173)/(91-101) 148/96 mmHg (01/31 0815) SpO2:  [96 %-97 %] 97 % (01/31 0815) Weight:  [97.2 kg (214 lb 4.6 oz)] 97.2 kg (214 lb 4.6 oz) (01/31 0532) Last BM Date: 09/19/13 240 Po yesterday, no stools recorded Diet:  DIII/TNA Tm 99.7, slightly tachycardic, BP up some Ongoing mild renal insuffiencey WBC up Bilateral lower lobe opacities. Intake/Output from previous day: 01/30 0701 - 01/31 0700 In: 3680 [P.O.:240; I.V.:1200; IV Piggyback:300; TPN:1940] Out: 3330 [Urine:3300; Drains:30] Intake/Output this shift: Total I/O In: 420 [I.V.:150; TPN:270] Out: 1050 [Urine:1050]  General appearance: alert, cooperative and no distress Resp: clear to auscultation bilaterally GI: soft, non-tender; bowel sounds normal; no masses,  no organomegaly and white drainage from the drains.  Lab Results:   Recent Labs  09/19/13 0350 09/20/13 0235  WBC 17.8* 20.2*  HGB 9.8* 10.4*  HCT 29.8* 31.1*  PLT 342 334    BMET  Recent Labs  09/19/13 0350 09/20/13 0235  NA 144 139  K 3.6* 4.1  CL 111 107  CO2 19 18*  GLUCOSE 126* 122*  BUN 41* 40*  CREATININE 1.50* 1.49*  CALCIUM 7.6* 7.6*   PT/INR No results found for this basename: LABPROT, INR,  in the last 72 hours   Recent Labs Lab 09/15/13 0400 09/18/13 0413 09/18/13 0545  AST 12 16 16   ALT 10 10 13   ALKPHOS 81 83 88  BILITOT 0.4 0.4 0.4  PROT 6.4 5.9* 6.4  ALBUMIN 1.8* 1.8* 2.0*     Lipase     Component Value Date/Time   LIPASE 14 08/29/2013 0930     Studies/Results: No results found.  Medications: . enoxaparin (LOVENOX) injection  40 mg Subcutaneous Q24H  . feeding supplement (RESOURCE BREEZE)  1 Container  Oral TID BM  . insulin aspart  0-15 Units Subcutaneous Q6H  . metoprolol tartrate  50 mg Oral BID  . vancomycin  500 mg Oral Q6H   And  . metronidazole  500 mg Intravenous Q8H  . pantoprazole (PROTONIX) IV  40 mg Intravenous Q12H  . sertraline  50 mg Oral TID  . sodium chloride  10-40 mL Intracatheter Q12H   . sodium chloride 20 mL/hr at 09/16/13 1732  . dextrose 100 mL/hr at 09/16/13 1411  . dextrose 50 mL/hr at 09/20/13 0544  . Marland KitchenTPN (CLINIMIX-E) Adult 80 mL/hr at 09/19/13 1723   And  . fat emulsion 250 mL (09/19/13 1723)  . Marland KitchenTPN (CLINIMIX-E) Adult     And  . 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 s/p 1. Exploratory laparotomy. 2. Debridement of necrotic stomach with scissors. 3. Primary repair of perforated stomach in 2 layers. 08/29/2013, Atilano Ina, MD Sepsis  Post op AF with RVR/hypotension NSTEMI from demand ischemia Acute renal insuffincey Post op respiratory failure/ARDS/ extubated 09/08/13 L radial artery subtotal occlusion Encephalopathy  C diff colitis hypersomnolence and brady arrest 09/13/13, re intubated/ extubated again 09/15/13 GERD Tobacco use    Plan:  For CT today, he is on the way to the CT scanner so I did not look at his abd wound.  I have ask pharmacy to look at all his IV orders and fix, they can call me if they need to to help resolve all the orders. Await CT results.  LOS: 23 days    Xinyi Batton 09/20/2013

## 2013-09-20 NOTE — Progress Notes (Signed)
CT reviewed (results below) and discussed with Dr. Eppie GibsonStahl and Dr.Hassell.  Will plan on percutaneous drainage of some of the fluid collections tomorrow since he is on Lovenox.  Will hold Lovenox until procedure is done.  FINDINGS:  Small bilateral pleural effusions again noted. Increased perihepatic  ascites demonstrated. There is persistent intraperitoneal air in the  upper abdomen. There is increased size of a focal collection  containing oral contrast and gas along the lesser curvature of the  gastric fundus. This measures 2.2 x 3.7 cm, and is highly suspicious  for persistent gastric perforation.  A loculated fluid collection which does not contain gas is again  seen in the gastrosplenic ligament which measures 5.5 x 4.9 cm on  image 41. This is mildly increased since previous study when it  measured 4.4 x 4.1 cm. Surgical drains remain in place and there is  diffuse soft tissue stranding seen throughout the intraperitoneal  fat within the upper abdomen.  A loculated gas and fluid collection is again seen in the pelvis  centrally, between the bladder and rectum which measures 3.6 x 5.1  cm and has not significantly changed since previous study.  No liver masses are identified. The gallbladder, pancreas, spleen,  and kidneys are normal in appearance. No evidence hydronephrosis. No  soft tissue masses or lymphadenopathy identified.  IMPRESSION:  Persistent intraperitoneal air, with mild increase in size of oral  contrast and gas collection along the lesser curvature of the  gastric fundus, suspicious for persistent gastric perforation.  Mild increase in size of 5.5 cm left upper quadrant fluid collection  in the gastrosplenic ligament. Stable 4.4 cm fluid and gas  collection in central pelvis. Abscesses cannot be excluded.  Increased ascites also noted.

## 2013-09-20 NOTE — Progress Notes (Signed)
PARENTERAL NUTRITION CONSULT NOTE - FOLLOW UP  Pharmacy Consult for TPN Indication: Prolonged ileus  No Known Allergies  Patient Measurements: Height: 5\' 11"  (180.3 cm) Weight: 214 lb 4.6 oz (97.2 kg) IBW/kg (Calculated) : 75.3 Vital Signs: Temp: 98.6 F (37 C) (01/31 0815) Temp src: Oral (01/31 0815) BP: 148/96 mmHg (01/31 0815) Pulse Rate: 101 (01/31 0815) Intake/Output from previous day: 01/30 0701 - 01/31 0700 In: 3680 [P.O.:240; I.V.:1200; IV Piggyback:300; TPN:1940] Out: 3330 [Urine:3300; Drains:30] Intake/Output from this shift: Total I/O In: -  Out: 400 [Urine:400]  Labs:  Recent Labs  09/18/13 0413 09/19/13 0350 09/20/13 0235  WBC 14.7* 17.8* 20.2*  HGB 9.9* 9.8* 10.4*  HCT 30.2* 29.8* 31.1*  PLT 343 342 334    Recent Labs  09/18/13 0413 09/18/13 0545 09/19/13 0350 09/20/13 0235  NA 134* 146 144 139  K 4.6 3.3* 3.6* 4.1  CL 102 112 111 107  CO2 17* 19 19 18*  GLUCOSE NOT DONE 140* 126* 122*  BUN 36* 38* 41* 40*  CREATININE 1.52* 1.52* 1.50* 1.49*  CALCIUM 7.5* 7.7* 7.6* 7.6*  MG 2.0  --  2.0  --   PHOS 6.1*  --  4.1  --   PROT 5.9* 6.4  --   --   ALBUMIN 1.8* 2.0*  --   --   AST 16 16  --   --   ALT 10 13  --   --   ALKPHOS 83 88  --   --   BILITOT 0.4 0.4  --   --    Estimated Creatinine Clearance: 71.3 ml/min (by C-G formula based on Cr of 1.49).    Recent Labs  09/19/13 1801 09/19/13 2356 09/20/13 0529  GLUCAP 116* 111* 119*   Insulin Requirements in the past 24 hours:  24 units insulin in TPN + 2 units from SSI  Current Nutrition:  Clinimix E 5/15 at 60mL/hr + lipid emulsion 20% at 10mL/hr Dys 3 diet, Resource tid  Nutritional Goals:  Goal 2250-2450 kcal and 150-160 gm protein per RD 09/15/13  Assessment:  48 y.o. male presented to ED 1/8 with abd pain. CT consistent with perforated stomach- ulcer due to NSAID use, pneumoperitoneum, peritonitis. Taken emergently to OR where he underwent exp lap, debridement of necrotic  stomach, repair of perforated stomach. Contrast study showed no leak from stomach or small bowel.   GI: Pt advanced Dys 3 diet and Resource tid. Not eating well.  Prealbumin 10.7 -remains low but trending up. CT scan 1/31.  Endo: No h/o DM. CBGs at goal + insulin in TPN  Lytes: K 4.1, Phos 4.1, Mag 2.0  Renal: Scr 1.50 - stable today. I/O approximately even.  Good UOP.  Pulm: RA  Cards: S/p cardiac arrest with CPR 1/24 until ROSC. BP elevated, HR 101.  Metoprolol PO. PRN metoprolol, labetalol, hydrazaline.  Hepatobil: LFTs wnl. Trig down to 110.  Neuro: on sertraline.  ID: CDiff PCR positive 1/21- IV Flagyl and PO vancomycin. Tmax 99.4. WBC 17.8  Vanc 1/8>>1/21 Zosyn 1/8>>1/14 Micafungin 1/9>>1/21 Primaxin 1/14>>1/23 Vanc PO 1/21>>2/4 Flagyl 1/21>>2/4  Best Practices: lovenox, PPI IV, MC  TPN Access: triple lumen PICC 1/20; replaced 1/27  TPN day#: 22  Plan:  1. Continue Clinimix E 5/15 (with electrolytes) to 80 mL/hr and cont lipid emulsion at 10 ml/hr.  Will hold on weaning further until PO intake improves. 2. Continue multivitamin and full trace elements in TPN 3. Decrease regular insulin in TPN to 20 units (proportional  to rate decrease) 4. F/u diet tolerance and ability to advance, wean TPN 5. BMET with AM labs.  Estella HuskMichelle Aubriana Ravelo, Pharm.D., BCPS, AAHIVP Clinical Pharmacist Phone: [phone] or [phone] 09/20/2013, 8:56 AM

## 2013-09-21 ENCOUNTER — Encounter (HOSPITAL_COMMUNITY): Payer: Self-pay | Admitting: Radiology

## 2013-09-21 ENCOUNTER — Inpatient Hospital Stay (HOSPITAL_COMMUNITY): Payer: Medicaid Other

## 2013-09-21 LAB — CBC
HCT: 32.5 % — ABNORMAL LOW (ref 39.0–52.0)
Hemoglobin: 10.8 g/dL — ABNORMAL LOW (ref 13.0–17.0)
MCH: 30 pg (ref 26.0–34.0)
MCHC: 33.2 g/dL (ref 30.0–36.0)
MCV: 90.3 fL (ref 78.0–100.0)
PLATELETS: 337 10*3/uL (ref 150–400)
RBC: 3.6 MIL/uL — ABNORMAL LOW (ref 4.22–5.81)
RDW: 14.7 % (ref 11.5–15.5)
WBC: 17.2 10*3/uL — AB (ref 4.0–10.5)

## 2013-09-21 LAB — GLUCOSE, CAPILLARY
GLUCOSE-CAPILLARY: 124 mg/dL — AB (ref 70–99)
Glucose-Capillary: 112 mg/dL — ABNORMAL HIGH (ref 70–99)
Glucose-Capillary: 114 mg/dL — ABNORMAL HIGH (ref 70–99)
Glucose-Capillary: 122 mg/dL — ABNORMAL HIGH (ref 70–99)
Glucose-Capillary: 129 mg/dL — ABNORMAL HIGH (ref 70–99)
Glucose-Capillary: 132 mg/dL — ABNORMAL HIGH (ref 70–99)

## 2013-09-21 LAB — BASIC METABOLIC PANEL
BUN: 39 mg/dL — ABNORMAL HIGH (ref 6–23)
CALCIUM: 8.1 mg/dL — AB (ref 8.4–10.5)
CO2: 21 meq/L (ref 19–32)
CREATININE: 1.47 mg/dL — AB (ref 0.50–1.35)
Chloride: 107 mEq/L (ref 96–112)
GFR calc Af Amer: 63 mL/min — ABNORMAL LOW (ref >90)
GFR calc non Af Amer: 54 mL/min — ABNORMAL LOW (ref >90)
Glucose, Bld: 120 mg/dL — ABNORMAL HIGH (ref 70–99)
Potassium: 4.5 mEq/L (ref 3.7–5.3)
Sodium: 142 mEq/L (ref 137–147)

## 2013-09-21 MED ORDER — ENOXAPARIN SODIUM 40 MG/0.4ML ~~LOC~~ SOLN
40.0000 mg | SUBCUTANEOUS | Status: DC
  Administered 2013-09-22 – 2013-09-25 (×4): 40 mg via SUBCUTANEOUS
  Filled 2013-09-21 (×5): qty 0.4

## 2013-09-21 MED ORDER — FENTANYL CITRATE 0.05 MG/ML IJ SOLN
INTRAMUSCULAR | Status: AC | PRN
  Administered 2013-09-21 (×2): 50 ug via INTRAVENOUS

## 2013-09-21 MED ORDER — FAT EMULSION 20 % IV EMUL
250.0000 mL | INTRAVENOUS | Status: AC
  Administered 2013-09-21: 250 mL via INTRAVENOUS
  Filled 2013-09-21: qty 250

## 2013-09-21 MED ORDER — METOPROLOL TARTRATE 1 MG/ML IV SOLN
5.0000 mg | Freq: Four times a day (QID) | INTRAVENOUS | Status: DC
  Administered 2013-09-21 – 2013-09-30 (×36): 5 mg via INTRAVENOUS
  Filled 2013-09-21 (×44): qty 5

## 2013-09-21 MED ORDER — IOHEXOL 300 MG/ML  SOLN
50.0000 mL | Freq: Once | INTRAMUSCULAR | Status: AC | PRN
  Administered 2013-09-21: 15 mL via ORAL

## 2013-09-21 MED ORDER — TRACE MINERALS CR-CU-F-FE-I-MN-MO-SE-ZN IV SOLN
INTRAVENOUS | Status: AC
  Administered 2013-09-21: 18:00:00 via INTRAVENOUS
  Filled 2013-09-21: qty 2400

## 2013-09-21 MED ORDER — FENTANYL CITRATE 0.05 MG/ML IJ SOLN
INTRAMUSCULAR | Status: AC
  Filled 2013-09-21: qty 4

## 2013-09-21 MED ORDER — MIDAZOLAM HCL 2 MG/2ML IJ SOLN
INTRAMUSCULAR | Status: AC
  Filled 2013-09-21: qty 4

## 2013-09-21 MED ORDER — LIDOCAINE HCL 1 % IJ SOLN
INTRAMUSCULAR | Status: AC
  Filled 2013-09-21: qty 10

## 2013-09-21 MED ORDER — MORPHINE SULFATE 2 MG/ML IJ SOLN
1.0000 mg | INTRAMUSCULAR | Status: DC | PRN
  Administered 2013-09-21: 2 mg via INTRAVENOUS
  Administered 2013-09-22 (×3): 4 mg via INTRAVENOUS
  Administered 2013-09-22 (×2): 2 mg via INTRAVENOUS
  Administered 2013-09-23 (×3): 4 mg via INTRAVENOUS
  Administered 2013-09-23 – 2013-09-24 (×3): 2 mg via INTRAVENOUS
  Administered 2013-09-24: 4 mg via INTRAVENOUS
  Administered 2013-09-24 – 2013-09-26 (×5): 2 mg via INTRAVENOUS
  Administered 2013-09-26 – 2013-09-27 (×2): 4 mg via INTRAVENOUS
  Administered 2013-09-28: 2 mg via INTRAVENOUS
  Administered 2013-09-28: 4 mg via INTRAVENOUS
  Administered 2013-09-28 – 2013-09-29 (×2): 2 mg via INTRAVENOUS
  Administered 2013-09-29 – 2013-10-04 (×11): 4 mg via INTRAVENOUS
  Administered 2013-10-04 – 2013-10-11 (×6): 2 mg via INTRAVENOUS
  Administered 2013-10-12: 4 mg via INTRAVENOUS
  Administered 2013-10-13 – 2013-10-14 (×2): 2 mg via INTRAVENOUS
  Filled 2013-09-21: qty 1
  Filled 2013-09-21 (×3): qty 2
  Filled 2013-09-21 (×6): qty 1
  Filled 2013-09-21 (×2): qty 2
  Filled 2013-09-21: qty 1
  Filled 2013-09-21: qty 2
  Filled 2013-09-21: qty 1
  Filled 2013-09-21 (×2): qty 2
  Filled 2013-09-21 (×2): qty 1
  Filled 2013-09-21: qty 2
  Filled 2013-09-21: qty 1
  Filled 2013-09-21: qty 2
  Filled 2013-09-21 (×3): qty 1
  Filled 2013-09-21: qty 2
  Filled 2013-09-21: qty 1
  Filled 2013-09-21 (×4): qty 2
  Filled 2013-09-21 (×2): qty 1
  Filled 2013-09-21: qty 2
  Filled 2013-09-21: qty 1
  Filled 2013-09-21 (×2): qty 2
  Filled 2013-09-21 (×2): qty 1
  Filled 2013-09-21 (×2): qty 2
  Filled 2013-09-21: qty 1
  Filled 2013-09-21 (×2): qty 2
  Filled 2013-09-21: qty 1

## 2013-09-21 MED ORDER — MIDAZOLAM HCL 2 MG/2ML IJ SOLN
INTRAMUSCULAR | Status: AC | PRN
  Administered 2013-09-21: 1 mg via INTRAVENOUS
  Administered 2013-09-21: 2 mg via INTRAVENOUS

## 2013-09-21 NOTE — Progress Notes (Signed)
Nursing Called daughter, Billey GoslingCharlie to inform her of procedure time.  Daughter expressed gratitude.

## 2013-09-21 NOTE — Procedures (Signed)
CT aspiration R subphrenic 134ml opaque lime-colored fluid, sent for GS, C&S  CT abscess drain (attempted) Guidewire easily passed into stomach through large wall defect. Drain not placed. D/w  Rosenbower  No complication No blood loss. [facility].

## 2013-09-21 NOTE — Progress Notes (Signed)
PARENTERAL NUTRITION CONSULT NOTE - FOLLOW UP  Pharmacy Consult for TPN Indication: Prolonged ileus  No Known Allergies  Patient Measurements: Height: 5\' 11"  (180.3 cm) Weight: 211 lb 6.7 oz (95.9 kg) IBW/kg (Calculated) : 75.3 Vital Signs: Temp: 98.6 F (37 C) (02/01 0748) Temp src: Oral (02/01 0748) BP: 155/91 mmHg (02/01 0421) Pulse Rate: 96 (02/01 0421) Intake/Output from previous day: 01/31 0701 - 02/01 0700 In: 2345 [P.O.:160; I.V.:225; IV Piggyback:150; TPN:1800] Out: 4306 [Urine:4275; Drains:31] Intake/Output from this shift: Total I/O In: -  Out: 740 [Urine:725; Drains:15]  Labs:  Recent Labs  09/19/13 0350 09/20/13 0235 09/21/13 0500  WBC 17.8* 20.2* 17.2*  HGB 9.8* 10.4* 10.8*  HCT 29.8* 31.1* 32.5*  PLT 342 334 337    Recent Labs  09/19/13 0350 09/20/13 0235 09/21/13 0500  NA 144 139 142  K 3.6* 4.1 4.5  CL 111 107 107  CO2 19 18* 21  GLUCOSE 126* 122* 120*  BUN 41* 40* 39*  CREATININE 1.50* 1.49* 1.47*  CALCIUM 7.6* 7.6* 8.1*  MG 2.0  --   --   PHOS 4.1  --   --    Estimated Creatinine Clearance: 71.8 ml/min (by C-G formula based on Cr of 1.47).    Recent Labs  09/20/13 1711 09/21/13 09/21/13 0637  GLUCAP 114* 112* 124*   Insulin Requirements in the past 24 hours:  24 units insulin in TPN + 4 units from SSI  Current Nutrition:  Clinimix E 5/15 at 2180mL/hr + lipid emulsion 20% at 6710mL/hr Back to NPO  Nutritional Goals:  Goal 2250-2450 kcal and 150-160 gm protein per RD 09/15/13  Assessment:  48 y.o. male presented to ED 1/8 with abd pain. CT consistent with perforated stomach- ulcer due to NSAID use, pneumoperitoneum, peritonitis. Taken emergently to OR where he underwent exp lap, debridement of necrotic stomach, repair of perforated stomach. Contrast study showed no leak from stomach or small bowel.   GI: Back to NPO status 1/31 due to possible persistent gastric perforation.  Prealbumin 10.7 -remains low but trending up. CT scan  1/31 - likely persistent gastric perforation, fluid collections that may be abscesses.  Endo: No h/o DM. CBGs at goal + insulin in TPN  Lytes: K 4.5, Phos 4.1, Mag 2.0  Renal: Scr 1.50 - stable today. Negative balance 1/31.  Good UOP.  Pulm: RA  Cards: S/p cardiac arrest with CPR 1/24 until ROSC. BP elevated, HR 101.  Metoprolol PO. PRN metoprolol, labetalol, hydrazaline.  Hepatobil: LFTs wnl. Trig down to 110.  Neuro: on sertraline.  ID: CDiff PCR positive 1/21- IV Flagyl and PO vancomycin. Tmax 99.2. WBC 17.2.  To IR 2/1 for possible intraabd abscess drainage.  Vanc 1/8>>1/21 Zosyn 1/8>>1/14 Micafungin 1/9>>1/21 Primaxin 1/14>>1/23 Vanc PO 1/21>>2/4 Flagyl 1/21>>2/4  Best Practices: lovenox, PPI IV, MC  TPN Access: triple lumen PICC 1/20; replaced 1/27  TPN day#: 23  Plan:  1. Increase Clinimix E 5/15 (with electrolytes) back to his goal rate of 100 mL/hr and cont lipid emulsion at 10 ml/hr.   2. Continue multivitamin and full trace elements in TPN 3. Increase regular insulin in TPN to 24 units (proportional to rate increase) 4. F/u diet tolerance and ability to advance, wean TPN 5. TPN labs on Monday 2/2  Estella HuskMichelle Jersey Ravenscroft, Pharm.D., BCPS, AAHIVP Clinical Pharmacist Phone: [phone] or [phone] 09/21/2013, 8:41 AM

## 2013-09-21 NOTE — H&P (Signed)
Chief Complaint: "Abdominal pain after recent stomach surgery." Referring Physician: CCS HPI: James Mccormick is an 48 y.o. male who presented on 08/28/13 c/o upper abdominal pain for 2-3 weeks, the patient had imaging which revealed perforated stomach, he underwent surgery on 08/28/13 with debridement and repair. The patient states his abdominal pain started getting worse approximately 1 week ago and wbc increasing so imaging was performed and revealed increase in size of collection. IR received request for image guided percutaneous drain placement. The patient has been NPO and lovenox held. He denies any active chest pain or shortness of breath, he denies any active bleeding. He denies any complications with previous anesthesia, however has never had conscious sedation that he remembers. He denies any history of sleep apnea or chronic oxygen use.    Past Medical History:  Past Medical History  Diagnosis Date  . GERD (gastroesophageal reflux disease)   . Hypertension     Past Surgical History:  Past Surgical History  Procedure Laterality Date  . Neck surgery    . Laparotomy N/A 08/28/2013    Procedure: EXPLORATORY LAPAROTOMY,debridment of nacrotic stomach,and primary repair of perforated stomach.;  Surgeon: Gayland Curry, MD;  Location: [facility] OR;  Service: General;  Laterality: N/A;    Family History:  Family History  Problem Relation Age of Onset  . Mental illness Mother   . Hypertension Sister   . Hypertension Brother     Social History:  reports that he has been smoking Cigarettes.  He has been smoking about 1.00 pack per day. He does not have any smokeless tobacco history on file. He reports that he does not drink alcohol or use illicit drugs.  Allergies: No Known Allergies  Medications:   Medication List    ASK your doctor about these medications       DIOVAN 320 MG tablet  Generic drug:  valsartan  Take 320 mg by mouth daily.     LORazepam 0.5 MG tablet  Commonly known as:   ATIVAN  Take 0.5 mg by mouth daily as needed for anxiety.     omeprazole 20 MG capsule  Commonly known as:  PRILOSEC  Take 20 mg by mouth daily.     sertraline 50 MG tablet  Commonly known as:  ZOLOFT  Take 50 mg by mouth 3 (three) times daily.       Please HPI for pertinent positives, otherwise complete 10 system ROS negative.  Physical Exam: BP 155/91  Pulse 96  Temp(Src) 98.6 F (37 C) (Oral)  Resp 22  Ht '5\' 11"'  (1.803 m)  Wt 211 lb 6.7 oz (95.9 kg)  BMI 29.50 kg/m2  SpO2 95% Body mass index is 29.5 kg/(m^2).  General Appearance:  Alert, cooperative, no distress  Head:  Normocephalic, without obvious abnormality, atraumatic  Neck: Supple, symmetrical, trachea midline  Lungs:   Clear to auscultation bilaterally, no w/r/r, respirations unlabored without use of accessory muscles.  Chest Wall:  No tenderness or deformity  Heart:  Tachycardic, regular rate and rhythm, S1, S2 normal, no murmur, rub or gallop.  Abdomen:   Soft, surgical (2) JP drains intact thick white output, (+) BS  Extremities: Extremities normal, atraumatic, no cyanosis or edema  Neurologic: Normal affect, no gross deficits.   Results for orders placed during the hospital encounter of 08/28/13 (from the past 48 hour(s))  URINALYSIS, ROUTINE W REFLEX MICROSCOPIC     Status: Abnormal   Collection Time    09/19/13  9:50 AM  Result Value Range   Color, Urine YELLOW  YELLOW   APPearance CLEAR  CLEAR   Specific Gravity, Urine 1.016  1.005 - 1.030   pH 7.0  5.0 - 8.0   Glucose, UA NEGATIVE  NEGATIVE mg/dL   Hgb urine dipstick NEGATIVE  NEGATIVE   Bilirubin Urine NEGATIVE  NEGATIVE   Ketones, ur NEGATIVE  NEGATIVE mg/dL   Protein, ur NEGATIVE  NEGATIVE mg/dL   Urobilinogen, UA 0.2  0.0 - 1.0 mg/dL   Nitrite NEGATIVE  NEGATIVE   Leukocytes, UA TRACE (*) NEGATIVE  URINE CULTURE     Status: None   Collection Time    09/19/13  9:50 AM      Result Value Range   Specimen Description URINE, CLEAN CATCH      Special Requests Normal     Culture  Setup Time       Value: 09/19/2013 10:37     Performed at [facility] Count       Value: NO GROWTH     Performed at [facility]   Culture       Value: NO GROWTH     Performed at [facility]   Report Status 09/20/2013 FINAL    URINE MICROSCOPIC-ADD ON     Status: None   Collection Time    09/19/13  9:50 AM      Result Value Range   Squamous Epithelial / LPF RARE  RARE   WBC, UA 3-6  <3 WBC/hpf   RBC / HPF 0-2  <3 RBC/hpf   Bacteria, UA RARE  RARE  GLUCOSE, CAPILLARY     Status: Abnormal   Collection Time    09/19/13 12:30 PM      Result Value Range   Glucose-Capillary 118 (*) 70 - 99 mg/dL   Comment 1 Documented in Chart     Comment 2 Notify RN    GLUCOSE, CAPILLARY     Status: Abnormal   Collection Time    09/19/13  6:01 PM      Result Value Range   Glucose-Capillary 116 (*) 70 - 99 mg/dL   Comment 1 Notify RN     Comment 2 Documented in Chart    GLUCOSE, CAPILLARY     Status: Abnormal   Collection Time    09/19/13 11:56 PM      Result Value Range   Glucose-Capillary 111 (*) 70 - 99 mg/dL   Comment 1 Documented in Chart     Comment 2 Notify RN    CBC WITH DIFFERENTIAL     Status: Abnormal   Collection Time    09/20/13  2:35 AM      Result Value Range   WBC 20.2 (*) 4.0 - 10.5 K/uL   RBC 3.46 (*) 4.22 - 5.81 MIL/uL   Hemoglobin 10.4 (*) 13.0 - 17.0 g/dL   HCT 31.1 (*) 39.0 - 52.0 %   MCV 89.9  78.0 - 100.0 fL   MCH 30.1  26.0 - 34.0 pg   MCHC 33.4  30.0 - 36.0 g/dL   RDW 14.7  11.5 - 15.5 %   Platelets 334  150 - 400 K/uL   Neutrophils Relative % 77  43 - 77 %   Lymphocytes Relative 10 (*) 12 - 46 %   Monocytes Relative 12  3 - 12 %   Eosinophils Relative 1  0 - 5 %   Basophils Relative 0  0 - 1 %  Neutro Abs 15.6 (*) 1.7 - 7.7 K/uL   Lymphs Abs 2.0  0.7 - 4.0 K/uL   Monocytes Absolute 2.4 (*) 0.1 - 1.0 K/uL   Eosinophils Absolute 0.2  0.0 - 0.7 K/uL   Basophils Absolute 0.0  0.0  - 0.1 K/uL   RBC Morphology POLYCHROMASIA PRESENT     WBC Morphology INCREASED BANDS (>20% BANDS)    BASIC METABOLIC PANEL     Status: Abnormal   Collection Time    09/20/13  2:35 AM      Result Value Range   Sodium 139  137 - 147 mEq/L   Potassium 4.1  3.7 - 5.3 mEq/L   Chloride 107  96 - 112 mEq/L   CO2 18 (*) 19 - 32 mEq/L   Glucose, Bld 122 (*) 70 - 99 mg/dL   BUN 40 (*) 6 - 23 mg/dL   Creatinine, Ser 1.49 (*) 0.50 - 1.35 mg/dL   Calcium 7.6 (*) 8.4 - 10.5 mg/dL   GFR calc non Af Amer 53 (*) >90 mL/min   GFR calc Af Amer 62 (*) >90 mL/min   Comment: (NOTE)     The eGFR has been calculated using the CKD EPI equation.     This calculation has not been validated in all clinical situations.     eGFR's persistently <90 mL/min signify possible Chronic Kidney     Disease.  GLUCOSE, CAPILLARY     Status: Abnormal   Collection Time    09/20/13  5:29 AM      Result Value Range   Glucose-Capillary 119 (*) 70 - 99 mg/dL   Comment 1 Documented in Chart     Comment 2 Notify RN    GLUCOSE, CAPILLARY     Status: Abnormal   Collection Time    09/20/13 12:24 PM      Result Value Range   Glucose-Capillary 127 (*) 70 - 99 mg/dL  GLUCOSE, CAPILLARY     Status: Abnormal   Collection Time    09/20/13  5:11 PM      Result Value Range   Glucose-Capillary 114 (*) 70 - 99 mg/dL  GLUCOSE, CAPILLARY     Status: Abnormal   Collection Time    09/21/13 12:00 AM      Result Value Range   Glucose-Capillary 112 (*) 70 - 99 mg/dL   Comment 1 Notify RN    BASIC METABOLIC PANEL     Status: Abnormal   Collection Time    09/21/13  5:00 AM      Result Value Range   Sodium 142  137 - 147 mEq/L   Potassium 4.5  3.7 - 5.3 mEq/L   Chloride 107  96 - 112 mEq/L   CO2 21  19 - 32 mEq/L   Glucose, Bld 120 (*) 70 - 99 mg/dL   BUN 39 (*) 6 - 23 mg/dL   Creatinine, Ser 1.47 (*) 0.50 - 1.35 mg/dL   Calcium 8.1 (*) 8.4 - 10.5 mg/dL   GFR calc non Af Amer 54 (*) >90 mL/min   GFR calc Af Amer 63 (*) >90 mL/min    Comment: (NOTE)     The eGFR has been calculated using the CKD EPI equation.     This calculation has not been validated in all clinical situations.     eGFR's persistently <90 mL/min signify possible Chronic Kidney     Disease.  CBC     Status: Abnormal   Collection Time  09/21/13  5:00 AM      Result Value Range   WBC 17.2 (*) 4.0 - 10.5 K/uL   RBC 3.60 (*) 4.22 - 5.81 MIL/uL   Hemoglobin 10.8 (*) 13.0 - 17.0 g/dL   HCT 32.5 (*) 39.0 - 52.0 %   MCV 90.3  78.0 - 100.0 fL   MCH 30.0  26.0 - 34.0 pg   MCHC 33.2  30.0 - 36.0 g/dL   RDW 14.7  11.5 - 15.5 %   Platelets 337  150 - 400 K/uL  GLUCOSE, CAPILLARY     Status: Abnormal   Collection Time    09/21/13  6:37 AM      Result Value Range   Glucose-Capillary 124 (*) 70 - 99 mg/dL   Comment 1 Notify RN     Ct Abdomen Pelvis W Contrast  09/20/2013   CLINICAL DATA:  Postop from repair of gastric perforation. Sepsis and septic shock. Evaluate for abscess.  EXAM: CT ABDOMEN AND PELVIS WITH CONTRAST  TECHNIQUE: Multidetector CT imaging of the abdomen and pelvis was performed using the standard protocol following bolus administration of intravenous contrast.  CONTRAST:  19m OMNIPAQUE IOHEXOL 300 MG/ML  SOLN  COMPARISON:  09/10/2013  FINDINGS: Small bilateral pleural effusions again noted. Increased perihepatic ascites demonstrated. There is persistent intraperitoneal air in the upper abdomen. There is increased size of a focal collection containing oral contrast and gas along the lesser curvature of the gastric fundus. This measures 2.2 x 3.7 cm, and is highly suspicious for persistent gastric perforation.  A loculated fluid collection which does not contain gas is again seen in the gastrosplenic ligament which measures 5.5 x 4.9 cm on image 41. This is mildly increased since previous study when it measured 4.4 x 4.1 cm. Surgical drains remain in place and there is diffuse soft tissue stranding seen throughout the intraperitoneal fat within  the upper abdomen.  A loculated gas and fluid collection is again seen in the pelvis centrally, between the bladder and rectum which measures 3.6 x 5.1 cm and has not significantly changed since previous study.  No liver masses are identified. The gallbladder, pancreas, spleen, and kidneys are normal in appearance. No evidence hydronephrosis. No soft tissue masses or lymphadenopathy identified.  IMPRESSION: Persistent intraperitoneal air, with mild increase in size of oral contrast and gas collection along the lesser curvature of the gastric fundus, suspicious for persistent gastric perforation.  Mild increase in size of 5.5 cm left upper quadrant fluid collection in the gastrosplenic ligament. Stable 4.4 cm fluid and gas collection in central pelvis. Abscesses cannot be excluded. Increased ascites also noted.  Critical Value/emergent results were called by telephone at the time of interpretation on 09/20/2013 at 12:15 PM to Dr. RBarkley Bruns who verbally acknowledged these results.   Electronically Signed   By: JEarle GellM.D.   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 Request for image guided percutaneous drain placement, images reviewed by Dr. HAne Paymentreviewed, NPO, lovenox held. Risks and Benefits discussed with the patient. All of the patient's questions were answered, patient is agreeable to proceed. Consent signed and in chart.   MTsosie BillingD PA-C 09/21/2013, 8:32 AM

## 2013-09-21 NOTE — Progress Notes (Signed)
Pt seen and discussed with family. Large stomach wall defect and fistula.  Stable.  NPO for now.   Cont TNA.  May need further surgical intervention once he is stronger.  Fluid collections aspirated.

## 2013-09-21 NOTE — Progress Notes (Signed)
Pt's daughter, Billey GoslingCharlie, notified that pt's procedure has been delayed and that I will call her as soon as I hear a new time. Renette ButtersGolden, Viona Gilmorehristy Lee

## 2013-09-21 NOTE — H&P (Signed)
Will also aspirate R subphrenic collection as requested by Dr Purnell Shoemakerosenbauer.

## 2013-09-21 NOTE — Progress Notes (Signed)
24 Days Post-Op  Subjective: He doesn't feel any different.  He was on Zoloft at home for depression, which family says he really needs.He is also on oral vancomycin and Metoprolol.    Objective: Vital signs in last 24 hours: Temp:  [98.2 F (36.8 C)-99.2 F (37.3 C)] 98.6 F (37 C) (02/01 0748) Pulse Rate:  [96-111] 108 (02/01 0845) Resp:  [17-22] 21 (02/01 0845) BP: (144-159)/(91-99) 144/92 mmHg (02/01 0845) SpO2:  [95 %-96 %] 95 % (02/01 0845) Weight:  [95.9 kg (211 lb 6.7 oz)] 95.9 kg (211 lb 6.7 oz) (02/01 0421) Last BM Date: 09/21/13 30 CC from drains, + stool, made NPO again yesterday after CT results BP still up some with diastolic in the 90's Afebrile yesterday Labs stable, WBC is down some. Intake/Output from previous day: 01/31 0701 - 02/01 0700 In: 2435 [P.O.:160; I.V.:225; IV Piggyback:150; TPN:1890] Out: 4306 [Urine:4275; Drains:31] Intake/Output this shift: Total I/O In: 90 [TPN:90] Out: 740 [Urine:725; Drains:15]  General appearance: alert, cooperative and no distress Cardio: tachycardic, sinus GI: soft, open, wound sutures are loose, he has some white fibrotics type necrosis at the bottom but most of it looks good. +BS, having some loose stools  Lab Results:   Recent Labs  09/20/13 0235 09/21/13 0500  WBC 20.2* 17.2*  HGB 10.4* 10.8*  HCT 31.1* 32.5*  PLT 334 337    BMET  Recent Labs  09/20/13 0235 09/21/13 0500  NA 139 142  K 4.1 4.5  CL 107 107  CO2 18* 21  GLUCOSE 122* 120*  BUN 40* 39*  CREATININE 1.49* 1.47*  CALCIUM 7.6* 8.1*   PT/INR No results found for this basename: LABPROT, INR,  in the last 72 hours   Recent Labs Lab 09/15/13 0400 09/18/13 0413 09/18/13 0545  AST 12 16 16   ALT 10 10 13   ALKPHOS 81 83 88  BILITOT 0.4 0.4 0.4  PROT 6.4 5.9* 6.4  ALBUMIN 1.8* 1.8* 2.0*     Lipase     Component Value Date/Time   LIPASE 14 08/29/2013 0930     Studies/Results: Ct Abdomen Pelvis W Contrast  09/20/2013   CLINICAL  DATA:  Postop from repair of gastric perforation. Sepsis and septic shock. Evaluate for abscess.  EXAM: CT ABDOMEN AND PELVIS WITH CONTRAST  TECHNIQUE: Multidetector CT imaging of the abdomen and pelvis was performed using the standard protocol following bolus administration of intravenous contrast.  CONTRAST:  80mL OMNIPAQUE IOHEXOL 300 MG/ML  SOLN  COMPARISON:  09/10/2013  FINDINGS: Small bilateral pleural effusions again noted. Increased perihepatic ascites demonstrated. There is persistent intraperitoneal air in the upper abdomen. There is increased size of a focal collection containing oral contrast and gas along the lesser curvature of the gastric fundus. This measures 2.2 x 3.7 cm, and is highly suspicious for persistent gastric perforation.  A loculated fluid collection which does not contain gas is again seen in the gastrosplenic ligament which measures 5.5 x 4.9 cm on image 41. This is mildly increased since previous study when it measured 4.4 x 4.1 cm. Surgical drains remain in place and there is diffuse soft tissue stranding seen throughout the intraperitoneal fat within the upper abdomen.  A loculated gas and fluid collection is again seen in the pelvis centrally, between the bladder and rectum which measures 3.6 x 5.1 cm and has not significantly changed since previous study.  No liver masses are identified. The gallbladder, pancreas, spleen, and kidneys are normal in appearance. No evidence hydronephrosis. No soft  tissue masses or lymphadenopathy identified.  IMPRESSION: Persistent intraperitoneal air, with mild increase in size of oral contrast and gas collection along the lesser curvature of the gastric fundus, suspicious for persistent gastric perforation.  Mild increase in size of 5.5 cm left upper quadrant fluid collection in the gastrosplenic ligament. Stable 4.4 cm fluid and gas collection in central pelvis. Abscesses cannot be excluded. Increased ascites also noted.  Critical Value/emergent  results were called by telephone at the time of interpretation on 09/20/2013 at 12:15 PM to Dr. Purnell Shoemaker, who verbally acknowledged these results.   Electronically Signed   By: Myles Rosenthal M.D.   On: 09/20/2013 12:25    Medications: . antiseptic oral rinse  15 mL Mouth Rinse q12n4p  . chlorhexidine  15 mL Mouth Rinse BID  . [START ON 09/22/2013] enoxaparin (LOVENOX) injection  40 mg Subcutaneous Q24H  . feeding supplement (RESOURCE BREEZE)  1 Container Oral TID BM  . fluconazole (DIFLUCAN) IV  200 mg Intravenous Q24H  . insulin aspart  0-15 Units Subcutaneous Q6H  . metoprolol tartrate  50 mg Oral BID  . vancomycin  500 mg Oral Q6H   And  . metronidazole  500 mg Intravenous Q8H  . pantoprazole (PROTONIX) IV  40 mg Intravenous Q12H  . sertraline  50 mg Oral TID  . sodium chloride  10-40 mL Intracatheter Q12H   . sodium chloride 20 mL/hr at 09/16/13 1732  . Marland KitchenTPN (CLINIMIX-E) Adult 80 mL/hr at 09/20/13 1900   And  . fat emulsion 250 mL (09/20/13 1900)  . Marland KitchenTPN (CLINIMIX-E) Adult     And  . 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 s/p 1. Exploratory laparotomy. 2. Debridement of necrotic stomach with scissors. 3. Primary repair of perforated stomach in 2 layers. 08/29/2013, Atilano Ina, MD  CT scan 09/20/13 shows possible persistent gastric perforation, fluid collection 5.5 x 4.9 cm gastrosplenic ligament, and a third collection between bladder and rectum.  (For IR drainage today. Sepsis  Post op AF with RVR/hypotension  NSTEMI from demand ischemia  Acute renal insuffincey  Post op respiratory failure/ARDS/ extubated 09/08/13  L radial artery subtotal occlusion  Encephalopathy  C diff colitis  hypersomnolence and brady arrest 09/13/13, re intubated/ extubated again 09/15/13  GERD  Tobacco use Remote hx of ETOH use, none for 13 years Depression on high dose Zoloft for some time.   Plan:  He is going to IR today.   i have changed his lopressor to IV, I am going to leave his Zoloft,Vancomycin PO for now, at least till Dr. [name] can see him.  We may have to stop that also.  Will decide on restarting IV antibiotic coverage after IR. Continue TNA for nutrition, labs ordered for AM.  LOS: 24 days    Perrie Ragin 09/21/2013

## 2013-09-22 LAB — DIFFERENTIAL
BASOS ABS: 0 10*3/uL (ref 0.0–0.1)
Basophils Relative: 0 % (ref 0–1)
EOS ABS: 0 10*3/uL (ref 0.0–0.7)
Eosinophils Relative: 0 % (ref 0–5)
LYMPHS ABS: 1.9 10*3/uL (ref 0.7–4.0)
Lymphocytes Relative: 13 % (ref 12–46)
MONO ABS: 1.5 10*3/uL — AB (ref 0.1–1.0)
MONOS PCT: 10 % (ref 3–12)
Neutro Abs: 11.4 10*3/uL — ABNORMAL HIGH (ref 1.7–7.7)
Neutrophils Relative %: 77 % (ref 43–77)

## 2013-09-22 LAB — COMPREHENSIVE METABOLIC PANEL
ALT: 16 U/L (ref 0–53)
AST: 17 U/L (ref 0–37)
Albumin: 2.3 g/dL — ABNORMAL LOW (ref 3.5–5.2)
Alkaline Phosphatase: 98 U/L (ref 39–117)
BILIRUBIN TOTAL: 0.5 mg/dL (ref 0.3–1.2)
BUN: 45 mg/dL — ABNORMAL HIGH (ref 6–23)
CALCIUM: 8.2 mg/dL — AB (ref 8.4–10.5)
CO2: 21 mEq/L (ref 19–32)
Chloride: 106 mEq/L (ref 96–112)
Creatinine, Ser: 1.46 mg/dL — ABNORMAL HIGH (ref 0.50–1.35)
GFR calc Af Amer: 63 mL/min — ABNORMAL LOW (ref >90)
GFR, EST NON AFRICAN AMERICAN: 55 mL/min — AB (ref >90)
GLUCOSE: 125 mg/dL — AB (ref 70–99)
Potassium: 4.7 mEq/L (ref 3.7–5.3)
SODIUM: 142 meq/L (ref 137–147)
Total Protein: 7.3 g/dL (ref 6.0–8.3)

## 2013-09-22 LAB — CBC
HCT: 33.2 % — ABNORMAL LOW (ref 39.0–52.0)
Hemoglobin: 11 g/dL — ABNORMAL LOW (ref 13.0–17.0)
MCH: 29.8 pg (ref 26.0–34.0)
MCHC: 33.1 g/dL (ref 30.0–36.0)
MCV: 90 fL (ref 78.0–100.0)
PLATELETS: 310 10*3/uL (ref 150–400)
RBC: 3.69 MIL/uL — ABNORMAL LOW (ref 4.22–5.81)
RDW: 14.8 % (ref 11.5–15.5)
WBC: 14.8 10*3/uL — AB (ref 4.0–10.5)

## 2013-09-22 LAB — GLUCOSE, CAPILLARY
GLUCOSE-CAPILLARY: 132 mg/dL — AB (ref 70–99)
GLUCOSE-CAPILLARY: 136 mg/dL — AB (ref 70–99)
GLUCOSE-CAPILLARY: 141 mg/dL — AB (ref 70–99)
Glucose-Capillary: 134 mg/dL — ABNORMAL HIGH (ref 70–99)

## 2013-09-22 LAB — MAGNESIUM: Magnesium: 2.3 mg/dL (ref 1.5–2.5)

## 2013-09-22 LAB — TRIGLYCERIDES: Triglycerides: 117 mg/dL (ref <150)

## 2013-09-22 LAB — PHOSPHORUS: Phosphorus: 4.9 mg/dL — ABNORMAL HIGH (ref 2.3–4.6)

## 2013-09-22 LAB — PREALBUMIN: PREALBUMIN: 19.3 mg/dL (ref 17.0–34.0)

## 2013-09-22 MED ORDER — METRONIDAZOLE IN NACL 5-0.79 MG/ML-% IV SOLN
500.0000 mg | Freq: Three times a day (TID) | INTRAVENOUS | Status: DC
  Administered 2013-09-22 – 2013-09-26 (×13): 500 mg via INTRAVENOUS
  Filled 2013-09-22 (×17): qty 100

## 2013-09-22 MED ORDER — TRACE MINERALS CR-CU-F-FE-I-MN-MO-SE-ZN IV SOLN
INTRAVENOUS | Status: AC
  Administered 2013-09-22: 18:00:00 via INTRAVENOUS
  Filled 2013-09-22: qty 2400

## 2013-09-22 MED ORDER — FAT EMULSION 20 % IV EMUL
250.0000 mL | INTRAVENOUS | Status: AC
  Administered 2013-09-22: 250 mL via INTRAVENOUS
  Filled 2013-09-22: qty 250

## 2013-09-22 NOTE — Progress Notes (Signed)
Chaplain made initial visit. Pt stated that he was doing okay and was just waiting to be moved back to bed. He stated that he did not have any needs at this time. Chaplain will follow up as necessary.

## 2013-09-22 NOTE — Progress Notes (Signed)
25 Days Post-Op  Subjective: No specific complaints today  Objective: Vital signs in last 24 hours: Temp:  [98.5 F (36.9 C)-99.5 F (37.5 C)] 98.5 F (36.9 C) (02/02 0737) Pulse Rate:  [104-120] 120 (02/02 0737) Resp:  [12-25] 25 (02/02 0737) BP: (138-152)/(86-98) 138/98 mmHg (02/02 0737) SpO2:  [93 %-97 %] 94 % (02/02 0737) Weight:  [204 lb 9.4 oz (92.8 kg)] 204 lb 9.4 oz (92.8 kg) (02/02 0415) Last BM Date: 09/20/13  Intake/Output from previous day: 02/01 0701 - 02/02 0700 In: 2760 [I.V.:220; IV Piggyback:200; TPN:2330] Out: 3370 [Urine:3200; Drains:35] Intake/Output this shift: Total I/O In: 520 [I.V.:80; TPN:440] Out: 330 [Urine:275; Emesis/NG output:50; Drains:5]  General appearance: no distress Resp: diminished breath sounds bibasilar Cardio: tachycardic rr GI: soft bs present, wound open with dehiscence, appears that viscera are covered, no drainage of enteric contents now  Lab Results:   Recent Labs  09/21/13 0500 09/22/13 0400  WBC 17.2* 14.8*  HGB 10.8* 11.0*  HCT 32.5* 33.2*  PLT 337 310   BMET  Recent Labs  09/21/13 0500 09/22/13 0400  NA 142 142  K 4.5 4.7  CL 107 106  CO2 21 21  GLUCOSE 120* 125*  BUN 39* 45*  CREATININE 1.47* 1.46*  CALCIUM 8.1* 8.2*   PT/INR No results found for this basename: LABPROT, INR,  in the last 72 hours ABG No results found for this basename: PHART, PCO2, PO2, HCO3,  in the last 72 hours  Studies/Results: Ct Abdomen Pelvis W Contrast  09/20/2013   CLINICAL DATA:  Postop from repair of gastric perforation. Sepsis and septic shock. Evaluate for abscess.  EXAM: CT ABDOMEN AND PELVIS WITH CONTRAST  TECHNIQUE: Multidetector CT imaging of the abdomen and pelvis was performed using the standard protocol following bolus administration of intravenous contrast.  CONTRAST:  80mL OMNIPAQUE IOHEXOL 300 MG/ML  SOLN  COMPARISON:  09/10/2013  FINDINGS: Small bilateral pleural effusions again noted. Increased perihepatic  ascites demonstrated. There is persistent intraperitoneal air in the upper abdomen. There is increased size of a focal collection containing oral contrast and gas along the lesser curvature of the gastric fundus. This measures 2.2 x 3.7 cm, and is highly suspicious for persistent gastric perforation.  A loculated fluid collection which does not contain gas is again seen in the gastrosplenic ligament which measures 5.5 x 4.9 cm on image 41. This is mildly increased since previous study when it measured 4.4 x 4.1 cm. Surgical drains remain in place and there is diffuse soft tissue stranding seen throughout the intraperitoneal fat within the upper abdomen.  A loculated gas and fluid collection is again seen in the pelvis centrally, between the bladder and rectum which measures 3.6 x 5.1 cm and has not significantly changed since previous study.  No liver masses are identified. The gallbladder, pancreas, spleen, and kidneys are normal in appearance. No evidence hydronephrosis. No soft tissue masses or lymphadenopathy identified.  IMPRESSION: Persistent intraperitoneal air, with mild increase in size of oral contrast and gas collection along the lesser curvature of the gastric fundus, suspicious for persistent gastric perforation.  Mild increase in size of 5.5 cm left upper quadrant fluid collection in the gastrosplenic ligament. Stable 4.4 cm fluid and gas collection in central pelvis. Abscesses cannot be excluded. Increased ascites also noted.  Critical Value/emergent results were called by telephone at the time of interpretation on 09/20/2013 at 12:15 PM to Dr. Purnell Shoemakerosenbauer, who verbally acknowledged these results.   Electronically Signed   By: Myles RosenthalJohn  Stahl  M.D.   On: 09/20/2013 12:25   Ct Aspiration  09/21/2013   CLINICAL DATA:  Gastric perforation and necrosis. Extravasation noted near operative repair site on recent follow-up abdomen CT, along with scattered free air as well as multiple loculated abdominal fluid  collections, largest in the right subphrenic region.  EXAM: CT-GUIDED PERITONEAL ABSCESS DRAIN CATHETER PLACEMENT (ATTEMPTED)  CT GUIDED ASPIRATION BIOPSY OF PERITONEAL COLLECTION  ANESTHESIA/SEDATION: Intravenous Fentanyl and Versed were administered as conscious sedation during continuous cardiorespiratory monitoring by the radiology RN, with a total moderate sedation time of 50 minutes.  PROCEDURE: The procedure risks, benefits, and alternatives were explained to the patient. Questions regarding the procedure were encouraged and answered. The patient understands and consents to the procedure.  Select axial scans through the abdomen were obtained and appropriate skin entry site sore identified. The right upper abdomen was prepped with Betadinein a sterile fashion, and a sterile drape was applied covering the operative field. Operator donned sterile gloves and mask. Local anesthesia was provided with 1% Lidocaine.  Under CT fluoroscopic guidance, an 18 gauge trocar needle was advanced into the gas and fluid collection medial to the lesser curvature of the stomach. A small amount of gas return through the needle. An Amplatz guidewire advanced easily, shown on CT fluoroscopy to be within the lumen of the stomach. After telephone discussion with Dr. Abbey Chatters regarding this situation, the decision was made not to proceed with drain catheter placement, as the drain catheter would almost certainly simply pass through the gastric wall defect into the lumen of the stomach.  Attention was then turned to the right subphrenic collection. Under CT fluoroscopic guidance, an 18 gauge trocar needle was advanced into the collection. Approximately 134 mL of opaque lime colored fluid were aspirated, samples sent for Gram stain, culture and sensitivity.  Postprocedure scans through the region show no hemorrhage or other apparent complication. The patient tolerated the procedure well.  Complications: None  FINDINGS: The small  extraluminal gas and fluid collection medial to the lesser curvature of the stomach was again identified. The extravasated contrast seen on the previous day's exam not seen; had either passed back through the defect into the lumen of the stomach or been absorbed. Because the guidewire passed so easily through the adjacent gastric wall defect, a drain catheter was not placed.  The large right subphrenic collection persisted and was aspirated under CT as above.  IMPRESSION: 1. Technically successful aspiration of right subphrenic collection. A sample was sent for Gram stain culture. 2. Guidewire passed from the extraluminal collection through the gastric wall defect, and decision made not to place drain catheter percutaneously as discussed above. At Dr. Maris Berger request, the patient will be transferred to fluoroscopy for image guided nasogastric tube placement.   Electronically Signed   By: Oley Balm M.D.   On: 09/21/2013 14:38   Dg Basil Dess Tube Plc W/fl W/rad  09/21/2013   CLINICAL DATA:  Recent gastric perforation with surgery to oversew the defect and subsequent dehiscence with leak. An attempt to drain a fluid collection adjacent to the stomach earlier today a resulted in the guidewire entering the stomach, indicating a persistent large communication. Nasogastric tube placement has been requested for gastric decompression in an attempt to heal the defect.  EXAM: NASOGASTRIC TUBE PLACEMENT WITH FLUORO BY RADIOLOGIST  TECHNIQUE: The right naris was anesthetized using viscous lidocaine. A 14 French nasogastric tube was placed via the right naris. The tube was guided into the esophagus with a chin-tuck maneuver.  The tube was then advanced through the esophagus into the stomach and looped in the gastric fundus without difficulty. Craddock injection of contrast was then performed to confirm the tube position. The tube was secured to the patient's nose. No apparent immediate complications.  CONTRAST:  15mL  Omnipaque Iohexol 300 MG/ML via nasogastric tube.  COMPARISON:  Multiple recent CT abdomen examinations.  FLUOROSCOPY TIME:  1 min 18 sec  FINDINGS: Contrast injection confirmed placement of the nasogastric tube in the fundus of the stomach. The defect in the medial aspect of the lesser curvature at the junction of the fundus and proximal body of the stomach is demonstrated, as there is extravasation of the contrast material.  IMPRESSION: 1. Uncomplicated placement of a 14 French nasogastric tube which is looped in the fundus of the stomach. 2. Contrast injection confirmed the previously identified leak arising from the medial aspect of the lesser curvature of the stomach at the junction of the fundus and body.   Electronically Signed   By: Hulan Saas M.D.   On: 09/21/2013 12:53    Anti-infectives: Anti-infectives   Start     Dose/Rate Route Frequency Ordered Stop   09/22/13 1130  metroNIDAZOLE (FLAGYL) IVPB 500 mg     500 mg 100 mL/hr over 60 Minutes Intravenous Every 8 hours 09/22/13 1119     09/20/13 1230  fluconazole (DIFLUCAN) IVPB 200 mg     200 mg 100 mL/hr over 60 Minutes Intravenous Every 24 hours 09/20/13 1154     09/20/13 1200  fluconazole (DIFLUCAN) IVPB 100 mg  Status:  Discontinued     100 mg 50 mL/hr over 60 Minutes Intravenous Every 24 hours 09/20/13 1151 09/20/13 1154   09/10/13 2200  metroNIDAZOLE (FLAGYL) IVPB 500 mg  Status:  Discontinued     500 mg 100 mL/hr over 60 Minutes Intravenous 3 times per day 09/10/13 1802 09/22/13 1118   09/10/13 2000  vancomycin (VANCOCIN) 50 mg/mL oral solution 500 mg  Status:  Discontinued     500 mg Oral 4 times per day 09/10/13 1802 09/21/13 1151   09/10/13 1800  vancomycin (VANCOCIN) 50 mg/mL oral solution 500 mg  Status:  Discontinued     500 mg Oral 4 times per day 09/10/13 1432 09/10/13 1802   09/10/13 1600  metroNIDAZOLE (FLAGYL) IVPB 500 mg  Status:  Discontinued     500 mg 100 mL/hr over 60 Minutes Intravenous 3 times per day  09/10/13 1432 09/10/13 1802   09/09/13 1602  vancomycin (VANCOCIN) 1,250 mg in sodium chloride 0.9 % 250 mL IVPB  Status:  Discontinued     1,250 mg 166.7 mL/hr over 90 Minutes Intravenous Every 24 hours 09/09/13 1057 09/10/13 1432   09/06/13 1600  imipenem-cilastatin (PRIMAXIN) 500 mg in sodium chloride 0.9 % 100 mL IVPB  Status:  Discontinued     500 mg 200 mL/hr over 30 Minutes Intravenous 3 times per day 09/06/13 1310 09/12/13 1048   09/03/13 1100  imipenem-cilastatin (PRIMAXIN) 500 mg in sodium chloride 0.9 % 100 mL IVPB  Status:  Discontinued     500 mg 200 mL/hr over 30 Minutes Intravenous Every 6 hours 09/03/13 1007 09/06/13 1310   09/02/13 1300  vancomycin (VANCOCIN) IVPB 1000 mg/200 mL premix  Status:  Discontinued     1,000 mg 200 mL/hr over 60 Minutes Intravenous Every 12 hours 09/01/13 1527 09/07/13 1340   08/30/13 2000  vancomycin (VANCOCIN) IVPB 750 mg/150 ml premix  Status:  Discontinued  750 mg 150 mL/hr over 60 Minutes Intravenous Every 8 hours 08/30/13 1356 09/01/13 1527   08/29/13 2200  micafungin (MYCAMINE) 100 mg in sodium chloride 0.9 % 100 mL IVPB  Status:  Discontinued     100 mg 100 mL/hr over 1 Hours Intravenous Every 24 hours 08/29/13 2036 09/10/13 1432   08/29/13 0400  vancomycin (VANCOCIN) IVPB 1000 mg/200 mL premix  Status:  Discontinued     1,000 mg 200 mL/hr over 60 Minutes Intravenous Every 8 hours 08/28/13 2358 08/30/13 1356   08/29/13 0400  piperacillin-tazobactam (ZOSYN) IVPB 3.375 g  Status:  Discontinued     3.375 g 12.5 mL/hr over 240 Minutes Intravenous 3 times per day 08/28/13 2358 09/03/13 1002   08/28/13 1845  [MAR Hold]  vancomycin (VANCOCIN) IVPB 1000 mg/200 mL premix     (On MAR Hold since 08/28/13 1957)   1,000 mg 200 mL/hr over 60 Minutes Intravenous  Once 08/28/13 1831 08/28/13 1955   08/28/13 1845  [MAR Hold]  piperacillin-tazobactam (ZOSYN) IVPB 3.375 g     (On MAR Hold since 08/28/13 1957)   3.375 g 100 mL/hr over 30 Minutes  Intravenous  Once 08/28/13 1831 08/28/13 2056      Assessment/Plan: POD 24 primary repair of gastric perforation with breakdown 1. Neuro- iv pain meds 2. pulm- cont pulm toilet 3. cv- stable continue meds 4. 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  He will need to remain npo, ng tube, tna for long period of time and this will likely require operative repair given the size of this.   5. Cont zosyn/diflucan for perforation/phlegmon, cont flagyl for c diff 6. Lovenox, scds, protonix LOS: 24 days    [facility] 09/22/2013

## 2013-09-22 NOTE — Progress Notes (Signed)
PARENTERAL NUTRITION CONSULT NOTE - FOLLOW UP  Pharmacy Consult for TPN Indication: Prolonged ileus  No Known Allergies  Patient Measurements: Height: 5\' 11"  (180.3 cm) Weight: 204 lb 9.4 oz (92.8 kg) IBW/kg (Calculated) : 75.3 Vital Signs: Temp: 98.5 F (36.9 C) (02/02 0737) Temp src: Oral (02/02 0737) BP: 138/98 mmHg (02/02 0737) Pulse Rate: 120 (02/02 0737) Intake/Output from previous day: 02/01 0701 - 02/02 0700 In: 2760 [I.V.:220; IV Piggyback:200; TPN:2330] Out: 3370 [Urine:3200; Drains:35] Intake/Output from this shift: Total I/O In: 130 [I.V.:20; TPN:110] Out: 180 [Urine:125; Emesis/NG output:50; Drains:5]  Labs:  Recent Labs  09/20/13 0235 09/21/13 0500 09/22/13 0400  WBC 20.2* 17.2* 14.8*  HGB 10.4* 10.8* 11.0*  HCT 31.1* 32.5* 33.2*  PLT 334 337 310    Recent Labs  09/20/13 0235 09/21/13 0500 09/22/13 0400  NA 139 142 142  K 4.1 4.5 4.7  CL 107 107 106  CO2 18* 21 21  GLUCOSE 122* 120* 125*  BUN 40* 39* 45*  CREATININE 1.49* 1.47* 1.46*  CALCIUM 7.6* 8.1* 8.2*  MG  --   --  2.3  PHOS  --   --  4.9*  PROT  --   --  7.3  ALBUMIN  --   --  2.3*  AST  --   --  17  ALT  --   --  16  ALKPHOS  --   --  98  BILITOT  --   --  0.5  TRIG  --   --  117   Estimated Creatinine Clearance: 71.2 ml/min (by C-G formula based on Cr of 1.46).    Recent Labs  09/21/13 1813 09/21/13 2347 09/22/13 0633  GLUCAP 129* 132* 136*   Insulin Requirements in the past 24 hours:  24 units insulin in TPN + 4 units from SSI  Current Nutrition:  Clinimix E 5/15 at 166mL/hr + lipid emulsion 20% at 60mL/hr NPO  Nutritional Goals:  Goal 2250-2450 kcal and 150-160 gm protein per RD 09/15/13  Assessment:  48 y.o. male presented to ED 1/8 with abd pain. CT consistent with perforated stomach- ulcer due to NSAID use, pneumoperitoneum, peritonitis. Taken emergently to OR where he underwent exp lap, debridement of necrotic stomach, repair of perforated stomach. Contrast  study showed no leak from stomach or small bowel.  CT scan 1/31 poss persistent gastric perf, fluid collection 4.5x4.9, and 3rd collection between bladder and rectum. 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   GI: Back to NPO status 1/31. 2/1. Large stomach wall defect and fistula.  Prealbumin 10.7 -remains low but trending up. CT scan 1/31 - likely persistent gastric perforation, fluid collections that may be abscesses. 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   Endo: No h/o DM. CBGs at goal + insulin in TPN  Lytes: lytes trending up,  K 4.7, Phos 4.9 Mag 2.3, corr Ca 9.56, CaxPhos = 47. May need a day without lytes  Renal: Scr 1.46- stable today. Negative balance 1/31.  Good UOP.  Hepatobil: LFTs wnl. Trig down to 117.  TPN Access: triple lumen PICC 1/20; replaced 1/27  TPN day#: 24  Plan:  1. Continue Clinimix E 5/15 (with electrolytes) at his goal rate of 100 mL/hr and cont lipid emulsion at 10 ml/hr.   2. Continue multivitamin and full trace elements in TPN 3.continue regular insulin in TPN to 24 units  4. Bmet, mag, phos in am.  He may need a day with no electrolytes in TPN Herby Abraham,  Pharm.D. [phone] 09/22/2013 9:15 AM

## 2013-09-22 NOTE — Progress Notes (Signed)
Utilization review completed.  

## 2013-09-22 NOTE — Progress Notes (Signed)
Physical Therapy Treatment Patient Details Name: Pamalee LeydenChristopher D Ramthun MRN: [id number] DOB: 05/16/1966 Today's Date: 09/22/2013 Time: [id number] PT Time Calculation (min): 27 min  PT Assessment / Plan / Recommendation  History of Present Illness 48 yo male smoker presented with abdominal pain from perforated gastric ulcer in setting of NSAID.  PCCM consulted to assist with septic shock and respiratory failure management.  Reintubated  1/24 and reextubated 1/26.   PT Comments   Pt session limited by abdominal pain and nausea. Pt remains to have resting HR in 120-130s and 140-150s with exertion. Attempted to ambulate but could only tolerate std pvt transfer to chair. Pt to provided with LE there ex to complete in chair as tolerated. PT to return to progress ambulation.   Follow Up Recommendations  CIR     Does the patient have the potential to tolerate intense rehabilitation     Barriers to Discharge        Equipment Recommendations       Recommendations for Other Services    Frequency Min 3X/week   Progress towards PT Goals Progress towards PT goals:  (limited by nausea/abd pain this date)  Plan Current plan remains appropriate    Precautions / Restrictions Precautions Precautions: Fall Precaution Comments: tachycardic, resting HR 120-130s, 140s-150 with exertion Restrictions Weight Bearing Restrictions: No   Pertinent Vitals/Pain Abdominal pain at 8/10    Mobility  Bed Mobility Overal bed mobility: Needs Assistance Bed Mobility: Rolling;Sidelying to Sit Rolling: Min assist Sidelying to sit: Min assist General bed mobility comments: v/cs for technique Transfers Overall transfer level: Needs assistance Equipment used: Rolling walker (2 wheeled) Transfers: Sit to/from [employer] CorporationStand;Stand Pivot Transfers Sit to Stand: Mod assist;+2 physical assistance Stand pivot transfers: Min assist General transfer comment: assist for anterior weight shift, and to achieve upright posture. attempted  x 2, pt c/o nausea and abd pain  Ambulation/Gait Ambulation/Gait assistance:  (deferred this date due to tachycardia and nausea/pain)    Exercises     PT Diagnosis:    PT Problem List:   PT Treatment Interventions:     PT Goals (current goals can now be found in the care plan section)    Visit Information  Last PT Received On: 09/22/13 Assistance Needed: +2 History of Present Illness: 48 yo male smoker presented with abdominal pain from perforated gastric ulcer in setting of NSAID.  PCCM consulted to assist with septic shock and respiratory failure management.  Reintubated  1/24 and reextubated 1/26.    Subjective Data  Subjective: pt c/o nausea   Cognition  Cognition Arousal/Alertness: Awake/alert Behavior During Therapy: WFL for tasks assessed/performed Overall Cognitive Status: Within Functional Limits for tasks assessed    Balance  General Comments General comments (skin integrity, edema, etc.): attempted ambulation however pt unable to tolerate  End of Session PT - End of Session Activity Tolerance: Patient limited by fatigue;Patient limited by lethargy;Patient limited by pain Patient left: in chair;with call bell/phone within reach;with nursing/sitter in room Nurse Communication: Mobility status   GP     Marcene BrawnChadwell, Manoah Deckard Marie 09/22/2013, 2:52 PM  Lewis ShockAshly Jessyka Austria, PT, DPT Pager #: [phone] Office #: [phone]

## 2013-09-23 LAB — GLUCOSE, CAPILLARY
GLUCOSE-CAPILLARY: 128 mg/dL — AB (ref 70–99)
Glucose-Capillary: 117 mg/dL — ABNORMAL HIGH (ref 70–99)
Glucose-Capillary: 125 mg/dL — ABNORMAL HIGH (ref 70–99)
Glucose-Capillary: 130 mg/dL — ABNORMAL HIGH (ref 70–99)
Glucose-Capillary: 133 mg/dL — ABNORMAL HIGH (ref 70–99)

## 2013-09-23 LAB — BASIC METABOLIC PANEL
BUN: 50 mg/dL — ABNORMAL HIGH (ref 6–23)
CO2: 22 mEq/L (ref 19–32)
Calcium: 8.2 mg/dL — ABNORMAL LOW (ref 8.4–10.5)
Chloride: 105 mEq/L (ref 96–112)
Creatinine, Ser: 1.49 mg/dL — ABNORMAL HIGH (ref 0.50–1.35)
GFR, EST AFRICAN AMERICAN: 62 mL/min — AB (ref >90)
GFR, EST NON AFRICAN AMERICAN: 53 mL/min — AB (ref >90)
Glucose, Bld: 127 mg/dL — ABNORMAL HIGH (ref 70–99)
Potassium: 4.8 mEq/L (ref 3.7–5.3)
SODIUM: 140 meq/L (ref 137–147)

## 2013-09-23 LAB — MAGNESIUM: MAGNESIUM: 2.4 mg/dL (ref 1.5–2.5)

## 2013-09-23 LAB — PHOSPHORUS: PHOSPHORUS: 5.1 mg/dL — AB (ref 2.3–4.6)

## 2013-09-23 MED ORDER — TRACE MINERALS CR-CU-F-FE-I-MN-MO-SE-ZN IV SOLN
INTRAVENOUS | Status: DC
  Filled 2013-09-23: qty 2400

## 2013-09-23 MED ORDER — FAT EMULSION 20 % IV EMUL
250.0000 mL | INTRAVENOUS | Status: DC
  Filled 2013-09-23: qty 250

## 2013-09-23 MED ORDER — M.V.I. ADULT IV INJ
INJECTION | INTRAVENOUS | Status: AC
  Administered 2013-09-23: 17:00:00 via INTRAVENOUS
  Filled 2013-09-23: qty 2400

## 2013-09-23 MED ORDER — FAT EMULSION 20 % IV EMUL
250.0000 mL | INTRAVENOUS | Status: AC
  Administered 2013-09-23: 250 mL via INTRAVENOUS
  Filled 2013-09-23: qty 250

## 2013-09-23 NOTE — Progress Notes (Signed)
PARENTERAL NUTRITION CONSULT NOTE - FOLLOW UP  Pharmacy Consult for TPN Indication: Prolonged ileus  No Known Allergies  Patient Measurements: Height: 5\' 11"  (180.3 cm) Weight: 200 lb 6.4 oz (90.901 kg) IBW/kg (Calculated) : 75.3 Vital Signs: Temp: 97.9 F (36.6 C) (02/03 0512) Temp src: Oral (02/03 0512) BP: 133/89 mmHg (02/03 0512) Pulse Rate: 105 (02/03 0512) Intake/Output from previous day: 02/02 0701 - 02/03 0700 In: 3180 [I.V.:440; NG/GT:100; IV Piggyback:200; TPN:2420] Out: 3187.5 [Urine:3025; Emesis/NG output:125; Drains:37.5] Intake/Output from this shift:    Labs:  Recent Labs  09/21/13 0500 09/22/13 0400  WBC 17.2* 14.8*  HGB 10.8* 11.0*  HCT 32.5* 33.2*  PLT 337 310    Recent Labs  09/21/13 0500 09/22/13 0400 09/23/13 0500  NA 142 142 140  K 4.5 4.7 4.8  CL 107 106 105  CO2 21 21 22   GLUCOSE 120* 125* 127*  BUN 39* 45* 50*  CREATININE 1.47* 1.46* 1.49*  CALCIUM 8.1* 8.2* 8.2*  MG  --  2.3 2.4  PHOS  --  4.9* 5.1*  PROT  --  7.3  --   ALBUMIN  --  2.3*  --   AST  --  17  --   ALT  --  16  --   ALKPHOS  --  98  --   BILITOT  --  0.5  --   PREALBUMIN  --  19.3  --   TRIG  --  117  --    Estimated Creatinine Clearance: 69.1 ml/min (by C-G formula based on Cr of 1.49).    Recent Labs  09/22/13 1816 09/22/13 2316 09/23/13 0553  GLUCAP 134* 128* 130*   Insulin Requirements in the past 24 hours:  24 units insulin in TPN + 6 units from SSI  Current Nutrition:  Clinimix E 5/15 at 14300mL/hr + lipid emulsion 20% at 5710mL/hr NPO  Nutritional Goals:  Goal 2250-2450 kcal and 150-160 gm protein per RD 09/15/13  Assessment:  48 y.o. male presented to ED 1/8 with abd pain. CT consistent with perforated stomach- ulcer due to NSAID use, pneumoperitoneum, peritonitis. Taken emergently to OR where he underwent exp lap, debridement of necrotic stomach, repair of perforated stomach. Contrast study showed no leak from stomach or small bowel.  CT scan  1/31 poss persistent gastric perf, fluid collection 4.5x4.9, and 3rd collection between bladder and rectum. 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   GI: Back to NPO status 1/31. 2/1. Large stomach wall defect and fistula.  Prealbumin 10.7 -remains low but trending up. CT scan 1/31 - likely persistent gastric perforation, fluid collections that may be abscesses. 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 Will require NPO/NG/TNA for a long time.   Nutrition: prealbumin up to 19.3 from 10.7, moving in the right direction  Endo: No h/o DM. CBGs at goal + insulin in TPN  Lytes: lytes trending up,  K 4.8, Phos 5.1 Mag 2.4, corr Ca 9.56, CaxPhos = 97.   Renal: Scr 1.49- stable today. Good UOP.  Hepatobil: LFTs wnl. Trig down to 117.  TPN Access: triple lumen PICC 1/20; replaced 1/27  TPN day#: 25  Plan:  1. Take electrolytes out today for 1 day; change to Clinimix 5/15 (without electrolytes) at his goal rate of 100 mL/hr and cont lipid emulsion at 10 ml/hr.   2. Continue multivitamin and full trace elements in TPN 3.continue regular insulin in TPN to 24 units  4. Bmet, mag, phos in am.   5. Consider  transitioning him to cyclic rate since he will be on TPN for a long time  Herby Abraham, Pharm.D. [phone] 09/23/2013 7:49 AM

## 2013-09-23 NOTE — Progress Notes (Signed)
NUTRITION FOLLOW UP  Intervention:    TPN per pharmacy RD to follow for nutrition care plan  Nutrition Dx:   Inadequate oral intake now related to inability to eat as evidenced by NPO status, ongoing  Goal:   Pt to meet >/= 90% of their estimated nutrition needs, met  Monitor:   TPN prescription, PO diet advancement, weight, labs, I/O's  Assessment:   Patient with PMH of GERD, HTN presented with severe abdominal pain associated with nausea & vomiting; had been taking Alleve and large amount of Goody's powder.   CT scan revealed perforated stomach ulcer due to NSAID use.   Patient s/p procedures 1/8:  EXPLORATORY LAPAROTOMY  DEBRIDEMENT OF NECROTIC STOMACH  REPAIR OF PERFORATED STOMACH   Patient tested positive for C difficile 1/21. IV Flagyl & PO Vancomycin added.  Patient s/p FEES 1/23.  Demonstrated mild dysphagia.  Advanced to Clear Liquids 1/27, Full Liquids 1/28. Patient diet advanced to dysphagia 3, thin liquids 1/29.  Patient's current diet is NPO as of 1/31. 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  Patient receiving TPN with Clinimix E 5/15 @ 100 ml/hr and lipids @ 10 ml/hr. Provides 2184 kcal, 120 gm protein daily. Meets 99% minimum estimated energy needs and 92% minimum estimated protein needs.  Labs reviewed. Noted prealbumin and triglycerides WNL. Potassium and magnesium are WNL; phosphorous is trending up and elevated at 5.1 on 2/3.   TPN pharmacist is considering transitioning patient to cyclic rate since he will be on TPN for a long time.   Height: 08/28/13  _0  (1.803 m)   Weight Status:   Wt Readings from Last 1 Encounters:  09/23/13 200 lb 6.4 oz (90.901 kg)    01/28  215 lb 01/27  220 lb 01/26   220 lb 01/24   221 lb  01/23   222 lb  01/22   230 lb  01/21   233 lb  01/20    252 lb  01/19   261 lb  01/18   276 lb  01/17   278 lb  01/16   279 lb  01/12   262 lb   Admission weight: 225 lb  Body mass index is 27.96 kg/(m^2).   Re-estimated needs:  Kcal: 2250-2450 Protein: 130-140 gm Fluid: 2.2-2.4 L  Skin: abdominal surgical incision   Diet Order: NPO    Intake/Output Summary (Last 24 hours) at 09/23/13 1102 Last data filed at 09/23/13 0900  Gross per 24 hour  Intake   2920 ml  Output 3232.5 ml  Net -312.5 ml    Last BM: 1/31  Labs:   Recent Labs Lab 09/19/13 0350  09/21/13 0500 09/22/13 0400 09/23/13 0500  NA 144  < > 142 142 140  K 3.6*  < > 4.5 4.7 4.8  CL 111  < > 107 106 105  CO2 19  < > _1 BUN 41*  < > 39* 45* 50*  CREATININE 1.50*  < > 1.47* 1.46* 1.49*  CALCIUM 7.6*  < > 8.1* 8.2* 8.2*  MG 2.0  --   --  2.3 2.4  PHOS 4.1  --   --  4.9* 5.1*  GLUCOSE 126*  < > 120* 125* 127*  < > = values in this interval not displayed.  CBG (last 3)  Recent Labs  09/22/13 1816 09/22/13 2316 09/23/13 0553  GLUCAP 134* 128* 130*    Scheduled Meds: . antiseptic oral rinse  15 mL Mouth Rinse q12n4p  . chlorhexidine  15 mL Mouth Rinse BID  . enoxaparin (LOVENOX) injection  40 mg Subcutaneous Q24H  . fluconazole (DIFLUCAN) IV  200 mg Intravenous Q24H  . insulin aspart  0-15 Units Subcutaneous Q6H  . metoprolol  5 mg Intravenous Q6H  . metronidazole  500 mg Intravenous Q8H  . pantoprazole (PROTONIX) IV  40 mg Intravenous Q12H  . sodium chloride  10-40 mL Intracatheter Q12H    Continuous Infusions: . sodium chloride 20 mL/hr at 09/16/13 1732  . Marland KitchenTPN (CLINIMIX-E) Adult 100 mL/hr at 09/22/13 1801   And  . fat emulsion 250 mL (09/22/13 1801)  . TPN (CLINIMIX) Adult without lytes     And  . fat emulsion      Claudell Kyle, Dietetic Intern Pager: [phone]  I agree with the above information and made appropriate revisions. Inda Coke MS, RD, LDN Pager: [phone] After-hours pager: [phone]

## 2013-09-23 NOTE — Progress Notes (Signed)
Patient ID: James Mccormick, male   DOB: 1966-02-04, 48 y.o.   MRN: [id number] 26 Days Post-Op  Subjective: Pt feels ok.  No new c/o  Objective: Vital signs in last 24 hours: Temp:  [97.6 F (36.4 C)-98.4 F (36.9 C)] 98.4 F (36.9 C) (02/03 0800) Pulse Rate:  [105-118] 111 (02/03 0800) Resp:  [16-24] 17 (02/03 0800) BP: (132-148)/(85-99) 134/85 mmHg (02/03 0800) SpO2:  [94 %-97 %] 94 % (02/03 0800) Weight:  [200 lb 6.4 oz (90.901 kg)] 200 lb 6.4 oz (90.901 kg) (02/03 0512) Last BM Date: 09/20/13  Intake/Output from previous day: 02/02 0701 - 02/03 0700 In: 3180 [I.V.:440; NG/GT:100; IV Piggyback:200; TPN:2420] Out: 3187.5 [Urine:3025; Emesis/NG output:125; Drains:37.5] Intake/Output this shift: Total I/O In: 280 [I.V.:40; NG/GT:20; TPN:220] Out: 375 [Urine:300; Emesis/NG output:75]  PE: Abd: soft, stable, Montgomery straps getting applied, JP 1 with purulent bloody drainage, JP 2 with thin clear, white output Heart: tachy, regular   Lab Results:   Recent Labs  2013-10-19 0500 09/22/13 0400  WBC 17.2* 14.8*  HGB 10.8* 11.0*  HCT 32.5* 33.2*  PLT 337 310   BMET  Recent Labs  09/22/13 0400 09/23/13 0500  NA 142 140  K 4.7 4.8  CL 106 105  CO2 21 22  GLUCOSE 125* 127*  BUN 45* 50*  CREATININE 1.46* 1.49*  CALCIUM 8.2* 8.2*   PT/INR No results found for this basename: LABPROT, INR,  in the last 72 hours CMP     Component Value Date/Time   NA 140 09/23/2013 0500   K 4.8 09/23/2013 0500   CL 105 09/23/2013 0500   CO2 22 09/23/2013 0500   GLUCOSE 127* 09/23/2013 0500   BUN 50* 09/23/2013 0500   CREATININE 1.49* 09/23/2013 0500   CALCIUM 8.2* 09/23/2013 0500   PROT 7.3 09/22/2013 0400   ALBUMIN 2.3* 09/22/2013 0400   AST 17 09/22/2013 0400   ALT 16 09/22/2013 0400   ALKPHOS 98 09/22/2013 0400   BILITOT 0.5 09/22/2013 0400   GFRNONAA 53* 09/23/2013 0500   GFRAA 62* 09/23/2013 0500   Lipase     Component Value Date/Time   LIPASE 14 08/29/2013 0930       Studies/Results: Ct  Aspiration  10-19-2013   CLINICAL DATA:  Gastric perforation and necrosis. Extravasation noted near operative repair site on recent follow-up abdomen CT, along with scattered free air as well as multiple loculated abdominal fluid collections, largest in the right subphrenic region.  EXAM: CT-GUIDED PERITONEAL ABSCESS DRAIN CATHETER PLACEMENT (ATTEMPTED)  CT GUIDED ASPIRATION BIOPSY OF PERITONEAL COLLECTION  ANESTHESIA/SEDATION: Intravenous Fentanyl and Versed were administered as conscious sedation during continuous cardiorespiratory monitoring by the radiology RN, with a total moderate sedation time of 50 minutes.  PROCEDURE: The procedure risks, benefits, and alternatives were explained to the patient. Questions regarding the procedure were encouraged and answered. The patient understands and consents to the procedure.  Select axial scans through the abdomen were obtained and appropriate skin entry site sore identified. The right upper abdomen was prepped with Betadinein a sterile fashion, and a sterile drape was applied covering the operative field. Operator donned sterile gloves and mask. Local anesthesia was provided with 1% Lidocaine.  Under CT fluoroscopic guidance, an 18 gauge trocar needle was advanced into the gas and fluid collection medial to the lesser curvature of the stomach. A small amount of gas return through the needle. An Amplatz guidewire advanced easily, shown on CT fluoroscopy to be within the lumen of the stomach. After telephone  discussion with Dr. Abbey Chatters regarding this situation, the decision was made not to proceed with drain catheter placement, as the drain catheter would almost certainly simply pass through the gastric wall defect into the lumen of the stomach.  Attention was then turned to the right subphrenic collection. Under CT fluoroscopic guidance, an 18 gauge trocar needle was advanced into the collection. Approximately 134 mL of opaque lime colored fluid were aspirated,  samples sent for Gram stain, culture and sensitivity.  Postprocedure scans through the region show no hemorrhage or other apparent complication. The patient tolerated the procedure well.  Complications: None  FINDINGS: The small extraluminal gas and fluid collection medial to the lesser curvature of the stomach was again identified. The extravasated contrast seen on the previous day's exam not seen; had either passed back through the defect into the lumen of the stomach or been absorbed. Because the guidewire passed so easily through the adjacent gastric wall defect, a drain catheter was not placed.  The large right subphrenic collection persisted and was aspirated under CT as above.  IMPRESSION: 1. Technically successful aspiration of right subphrenic collection. A sample was sent for Gram stain culture. 2. Guidewire passed from the extraluminal collection through the gastric wall defect, and decision made not to place drain catheter percutaneously as discussed above. At Dr. Maris Berger request, the patient will be transferred to fluoroscopy for image guided nasogastric tube placement.   Electronically Signed   By: Oley Balm M.D.   On: 09/21/2013 14:38   Dg Basil Dess Tube Plc W/fl W/rad  09/21/2013   CLINICAL DATA:  Recent gastric perforation with surgery to oversew the defect and subsequent dehiscence with leak. An attempt to drain a fluid collection adjacent to the stomach earlier today a resulted in the guidewire entering the stomach, indicating a persistent large communication. Nasogastric tube placement has been requested for gastric decompression in an attempt to heal the defect.  EXAM: NASOGASTRIC TUBE PLACEMENT WITH FLUORO BY RADIOLOGIST  TECHNIQUE: The right naris was anesthetized using viscous lidocaine. A 14 French nasogastric tube was placed via the right naris. The tube was guided into the esophagus with a chin-tuck maneuver. The tube was then advanced through the esophagus into the stomach and  looped in the gastric fundus without difficulty. Brocato injection of contrast was then performed to confirm the tube position. The tube was secured to the patient's nose. No apparent immediate complications.  CONTRAST:  15mL Omnipaque Iohexol 300 MG/ML via nasogastric tube.  COMPARISON:  Multiple recent CT abdomen examinations.  FLUOROSCOPY TIME:  1 min 18 sec  FINDINGS: Contrast injection confirmed placement of the nasogastric tube in the fundus of the stomach. The defect in the medial aspect of the lesser curvature at the junction of the fundus and proximal body of the stomach is demonstrated, as there is extravasation of the contrast material.  IMPRESSION: 1. Uncomplicated placement of a 14 French nasogastric tube which is looped in the fundus of the stomach. 2. Contrast injection confirmed the previously identified leak arising from the medial aspect of the lesser curvature of the stomach at the junction of the fundus and body.   Electronically Signed   By: Hulan Saas M.D.   On: 09/21/2013 12:53    Anti-infectives: Anti-infectives   Start     Dose/Rate Route Frequency Ordered Stop   09/22/13 1400  metroNIDAZOLE (FLAGYL) IVPB 500 mg     500 mg 100 mL/hr over 60 Minutes Intravenous Every 8 hours 09/22/13 1119  09/20/13 1230  fluconazole (DIFLUCAN) IVPB 200 mg     200 mg 100 mL/hr over 60 Minutes Intravenous Every 24 hours 09/20/13 1154     09/20/13 1200  fluconazole (DIFLUCAN) IVPB 100 mg  Status:  Discontinued     100 mg 50 mL/hr over 60 Minutes Intravenous Every 24 hours 09/20/13 1151 09/20/13 1154   09/10/13 2200  metroNIDAZOLE (FLAGYL) IVPB 500 mg  Status:  Discontinued     500 mg 100 mL/hr over 60 Minutes Intravenous 3 times per day 09/10/13 1802 09/22/13 1118   09/10/13 2000  vancomycin (VANCOCIN) 50 mg/mL oral solution 500 mg  Status:  Discontinued     500 mg Oral 4 times per day 09/10/13 1802 09/21/13 1151   09/10/13 1800  vancomycin (VANCOCIN) 50 mg/mL oral solution 500 mg   Status:  Discontinued     500 mg Oral 4 times per day 09/10/13 1432 09/10/13 1802   09/10/13 1600  metroNIDAZOLE (FLAGYL) IVPB 500 mg  Status:  Discontinued     500 mg 100 mL/hr over 60 Minutes Intravenous 3 times per day 09/10/13 1432 09/10/13 1802   09/09/13 1602  vancomycin (VANCOCIN) 1,250 mg in sodium chloride 0.9 % 250 mL IVPB  Status:  Discontinued     1,250 mg 166.7 mL/hr over 90 Minutes Intravenous Every 24 hours 09/09/13 1057 09/10/13 1432   09/06/13 1600  imipenem-cilastatin (PRIMAXIN) 500 mg in sodium chloride 0.9 % 100 mL IVPB  Status:  Discontinued     500 mg 200 mL/hr over 30 Minutes Intravenous 3 times per day 09/06/13 1310 09/12/13 1048   09/03/13 1100  imipenem-cilastatin (PRIMAXIN) 500 mg in sodium chloride 0.9 % 100 mL IVPB  Status:  Discontinued     500 mg 200 mL/hr over 30 Minutes Intravenous Every 6 hours 09/03/13 1007 09/06/13 1310   09/02/13 1300  vancomycin (VANCOCIN) IVPB 1000 mg/200 mL premix  Status:  Discontinued     1,000 mg 200 mL/hr over 60 Minutes Intravenous Every 12 hours 09/01/13 1527 09/07/13 1340   08/30/13 2000  vancomycin (VANCOCIN) IVPB 750 mg/150 ml premix  Status:  Discontinued     750 mg 150 mL/hr over 60 Minutes Intravenous Every 8 hours 08/30/13 1356 09/01/13 1527   08/29/13 2200  micafungin (MYCAMINE) 100 mg in sodium chloride 0.9 % 100 mL IVPB  Status:  Discontinued     100 mg 100 mL/hr over 1 Hours Intravenous Every 24 hours 08/29/13 2036 09/10/13 1432   08/29/13 0400  vancomycin (VANCOCIN) IVPB 1000 mg/200 mL premix  Status:  Discontinued     1,000 mg 200 mL/hr over 60 Minutes Intravenous Every 8 hours 08/28/13 2358 08/30/13 1356   08/29/13 0400  piperacillin-tazobactam (ZOSYN) IVPB 3.375 g  Status:  Discontinued     3.375 g 12.5 mL/hr over 240 Minutes Intravenous 3 times per day 08/28/13 2358 09/03/13 1002   08/28/13 1845  [MAR Hold]  vancomycin (VANCOCIN) IVPB 1000 mg/200 mL premix     (On MAR Hold since 08/28/13 1957)   1,000 mg 200  mL/hr over 60 Minutes Intravenous  Once 08/28/13 1831 08/28/13 1955   08/28/13 1845  [MAR Hold]  piperacillin-tazobactam (ZOSYN) IVPB 3.375 g     (On MAR Hold since 08/28/13 1957)   3.375 g 100 mL/hr over 30 Minutes Intravenous  Once 08/28/13 1831 08/28/13 2056       Assessment/Plan POD 25 primary repair of gastric perforation with breakdown  1. Neuro- iv pain meds  2. pulm- cont  pulm toilet  3. cv- stable continue meds  4. 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 He will need to remain npo, ng tube, tna for long period of time and this will likely require operative repair given the size of this.  5. Cont zosyn/diflucan for perforation/phlegmon, cont flagyl for c diff  6. Lovenox, scds, protonix 7. The patient is medicaid pending, which means he can not go to any type of facility unless they accept him as a charity case.  CM is looking into this.  Otherwise, patient will be in the hospital likely for months. 8. Transfer to the floor today, remain on tele     LOS: 26 days    Milton Sagona E 09/23/2013, 10:11 AM Pager: [phone]

## 2013-09-23 NOTE — Progress Notes (Signed)
Will recheck cbc in am to follow wbc, dont think there is any other treatment right now except drains, ng, npo, tna.  Needs to remain on abx

## 2013-09-23 NOTE — Progress Notes (Signed)
Physical Therapy Treatment Patient Details Name: James LeydenChristopher D Prindle MRN: [id number] DOB: 08/22/1965 Today's Date: 09/23/2013 Time: 0842-0909 PT Time Calculation (min): 27 min  PT Assessment / Plan / Recommendation  History of Present Illness 48 yo male smoker presented with abdominal pain from perforated gastric ulcer in setting of NSAID.  PCCM consulted to assist with septic shock and respiratory failure management.  Reintubated  1/24 and reextubated 1/26.   PT Comments   Pt with significant activity/ambulation tolerance this date. Pt with improved nausea and pain. Pt progressing well. Acute PT to con't to progress mobility.   Follow Up Recommendations  CIR     Does the patient have the potential to tolerate intense rehabilitation     Barriers to Discharge        Equipment Recommendations       Recommendations for Other Services    Frequency Min 3X/week   Progress towards PT Goals Progress towards PT goals: Progressing toward goals  Plan Current plan remains appropriate    Precautions / Restrictions Precautions Precautions: Fall Precaution Comments: tachycardic, resting HR 120-130s, 140s-150 with exertion Restrictions Weight Bearing Restrictions: No   Pertinent Vitals/Pain 4/10 abd pain    Mobility  Bed Mobility Overal bed mobility: Needs Assistance Bed Mobility: Rolling;Sidelying to Sit Rolling: Min assist Sidelying to sit: Min assist General bed mobility comments: v/c's for technique, minA for trunk elevation Transfers Overall transfer level: Needs assistance Equipment used: Rolling walker (2 wheeled) Transfers: Sit to/from Stand Sit to Stand: Min assist General transfer comment: pt much improved from yesterday, v/c's for safe [place] placement Ambulation/Gait Ambulation/Gait assistance: Min assist Ambulation Distance (Feet): 100 Feet Assistive device: Rolling walker (2 wheeled) Gait Pattern/deviations: Step-through pattern;Decreased stride length General Gait  Details: pt very shaky, SpO2 88-94%. HR 140s. increased bilat UE weight-bearing    Exercises General Exercises - Lower Extremity Ankle Circles/Pumps: AROM;Both;10 reps Long Arc Quad: AROM;Both;10 reps   PT Diagnosis:    PT Problem List:   PT Treatment Interventions:     PT Goals (current goals can now be found in the care plan section)    Visit Information  Last PT Received On: 09/23/13 Assistance Needed: +1 History of Present Illness: 48 yo male smoker presented with abdominal pain from perforated gastric ulcer in setting of NSAID.  PCCM consulted to assist with septic shock and respiratory failure management.  Reintubated  1/24 and reextubated 1/26.    Subjective Data  Subjective: pt in much better spirits today   Cognition  Cognition Arousal/Alertness: Awake/alert Behavior During Therapy: WFL for tasks assessed/performed Overall Cognitive Status: Within Functional Limits for tasks assessed    Balance     End of Session PT - End of Session Equipment Utilized During Treatment: Gait belt Activity Tolerance: Patient limited by fatigue Patient left: in chair;with call bell/phone within reach;with nursing/sitter in room Nurse Communication: Mobility status   GP     Marcene BrawnChadwell, Westen Dinino Marie 09/23/2013, 9:18 AM  Lewis ShockAshly Antonea Gaut, PT, DPT Pager #: [phone] Office #: [phone]

## 2013-09-23 NOTE — Care Management Note (Signed)
Page 1 of 2   10/14/2013     12:34:21 PM   CARE MANAGEMENT NOTE 10/14/2013  Patient:  James Mccormick,James Mccormick   Account Number:  [id number]  Date Initiated:  09/01/2013  Documentation initiated by:  [facility]OWN,SARAH  Subjective/Objective Assessment:   perferated gastric ulcer repair.  Remains on vent/pressors     Action/Plan:   s/p exp lap   Anticipated DC Date:  10/14/2013   Anticipated DC Plan:  LONG TERM ACUTE CARE (LTAC)  In-house referral  Clinical Social Worker      DC Planning Services  CM consult      Choice offered to / List presented to:             Status of service:  In process, will continue to follow Medicare Important Message given?   (If response is "NO", the following Medicare IM given date fields will be blank) Date Medicare IM given:   Date Additional Medicare IM given:    Discharge Disposition:  LONG TERM ACUTE CARE (LTAC)  Per UR Regulation:  Reviewed for med. necessity/level of care/duration of stay  If discussed at Long Length of Stay Meetings, dates discussed:   09/02/2013  09/04/2013  09/23/2013    Comments:  ContactMatthew Saras:  Boutwell,Barbara Mother [phone]                 Futrell,Charlie Daughter [phone]   10-14-13 [facility] , [facility] cleared patient to be transferred . Nicholaus BloomKelley and Will aware . [facility] awaiting discharge summary . Bedside nurse and charge nurse given information room 5706 , DR Criselda PeachesBarkat , nurse to call report to [phone].  Ronny FlurryHeather Dotsie Gillette RN BSN [phone]   10-02-13 [facility] offering [facility] today , however, spoke to DR Janee Mornhompson , patient is not medically ready , needs drain placed , maybe medically ready next week. [facility]. Ronny FlurryHeather Alaylah Heatherington RN BSN [phone]  09-26-12 Patient's family brought in insurance card .  [facility] front [facility] .  DFFS  [employer]  Rx [phone]  RX GRP : Rich [id number] RXID [id number]   Ronny FlurryHeather Kimbrely Buckel RN BSN [phone]  [phone]  09/23/13- 1100- Donn PieriniKristi Webster RN, BSN [phone] Discussed Mccormick/c planning with [facility]- as pt is Medicaid pending LTAC option is not a viable option but will place calls to both [facility] to feel out options- 1430- update- have spoken to [facility] representative- who states that typically they do not accept Medicaid/medicaid pending but she will look at pt and let this CM know if [facility] is an option (would take special approval)  ([facility] was filed last week) also spoke with [facility] rep- and [facility] would not be able to offer a bed, f/u done with Britta MccreedyBarbara with [facility]- and with pt's long term medical issues [facility] would not be an option at this time either- it may be that pt's best option might be SNF- will have [facility] look into options for that route although with TNA-it will be limiting- CM will cont. 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  He will need to remain npo, ng tube, tna for long period of time and  this will likely require operative repair given the size of this. 5. Cont zosyn/diflucan for perforation/phlegmon, cont flagyl for c diff   09-04-13 9am Avie Arenas, RNBSN - [phone] Remains on vent and pressors.

## 2013-09-24 LAB — BASIC METABOLIC PANEL
BUN: 52 mg/dL — ABNORMAL HIGH (ref 6–23)
CHLORIDE: 105 meq/L (ref 96–112)
CO2: 21 mEq/L (ref 19–32)
CREATININE: 1.47 mg/dL — AB (ref 0.50–1.35)
Calcium: 8.1 mg/dL — ABNORMAL LOW (ref 8.4–10.5)
GFR, EST AFRICAN AMERICAN: 63 mL/min — AB (ref >90)
GFR, EST NON AFRICAN AMERICAN: 54 mL/min — AB (ref >90)
GLUCOSE: 134 mg/dL — AB (ref 70–99)
POTASSIUM: 4.4 meq/L (ref 3.7–5.3)
Sodium: 139 mEq/L (ref 137–147)

## 2013-09-24 LAB — CBC
HCT: 33.6 % — ABNORMAL LOW (ref 39.0–52.0)
Hemoglobin: 11.1 g/dL — ABNORMAL LOW (ref 13.0–17.0)
MCH: 30.2 pg (ref 26.0–34.0)
MCHC: 33 g/dL (ref 30.0–36.0)
MCV: 91.6 fL (ref 78.0–100.0)
Platelets: 293 10*3/uL (ref 150–400)
RBC: 3.67 MIL/uL — AB (ref 4.22–5.81)
RDW: 14.9 % (ref 11.5–15.5)
WBC: 14.5 10*3/uL — ABNORMAL HIGH (ref 4.0–10.5)

## 2013-09-24 LAB — MAGNESIUM: Magnesium: 2.1 mg/dL (ref 1.5–2.5)

## 2013-09-24 LAB — PHOSPHORUS: Phosphorus: 3.8 mg/dL (ref 2.3–4.6)

## 2013-09-24 LAB — GLUCOSE, CAPILLARY
GLUCOSE-CAPILLARY: 134 mg/dL — AB (ref 70–99)
GLUCOSE-CAPILLARY: 134 mg/dL — AB (ref 70–99)
Glucose-Capillary: 135 mg/dL — ABNORMAL HIGH (ref 70–99)
Glucose-Capillary: 140 mg/dL — ABNORMAL HIGH (ref 70–99)

## 2013-09-24 MED ORDER — PIPERACILLIN-TAZOBACTAM 3.375 G IVPB
3.3750 g | Freq: Three times a day (TID) | INTRAVENOUS | Status: DC
  Administered 2013-09-24 – 2013-09-30 (×20): 3.375 g via INTRAVENOUS
  Filled 2013-09-24 (×22): qty 50

## 2013-09-24 MED ORDER — FAT EMULSION 20 % IV EMUL
240.0000 mL | INTRAVENOUS | Status: AC
  Administered 2013-09-24: 240 mL via INTRAVENOUS
  Filled 2013-09-24 (×2): qty 250

## 2013-09-24 MED ORDER — INSULIN ASPART 100 UNIT/ML ~~LOC~~ SOLN
0.0000 [IU] | SUBCUTANEOUS | Status: DC
  Administered 2013-09-24 (×3): 2 [IU] via SUBCUTANEOUS
  Administered 2013-09-25 – 2013-09-27 (×4): 3 [IU] via SUBCUTANEOUS

## 2013-09-24 MED ORDER — M.V.I. ADULT IV INJ
INTRAVENOUS | Status: AC
  Administered 2013-09-24: 17:00:00 via INTRAVENOUS
  Filled 2013-09-24: qty 2400

## 2013-09-24 NOTE — Evaluation (Signed)
Occupational Therapy Evaluation Patient Details Name: James Mccormick MRN: [id number] DOB: 09/01/1965 Today's Date: 09/24/2013 Time: [id number] OT Time Calculation (min): 21 min  OT Assessment / Plan / Recommendation History of present illness 48 yo male smoker presented with abdominal pain from perforated gastric ulcer in setting of NSAID.  PCCM consulted to assist with septic shock and respiratory failure management.  Reintubated  1/24 and reextubated 1/26.   Clinical Impression   Pt presents with below problem list. Pt independent with ADLs, PTA. Feel pt will benefit from acute OT to increase independence prior to d/c.     OT Assessment  Patient needs continued OT Services    Follow Up Recommendations  CIR    Barriers to Discharge      Equipment Recommendations  Other (comment) (defer to next venue)    Recommendations for Other Services Rehab consult  Frequency  Min 2X/week    Precautions / Restrictions Precautions Precautions: Fall Precaution Comments: watch HR (tachy) Required Braces or Orthoses: Other Brace/Splint (abdominal binder) Other Brace/Splint: abdominal binder Restrictions Weight Bearing Restrictions: No Other Position/Activity Restrictions: NG tube   Pertinent Vitals/Pain Pain 8/10 in abdomen area. Pt appeared to be sitting comfortable in chair at end of session. HR in 130-150's at times-nurse aware. Educated on deep breathing.     ADL  Eating/Feeding: NPO Grooming: Wash/dry face;Teeth care;Min guard;Set up (washed face-Setup A and teeth care-Min guard) Where Assessed - Grooming: Supported sitting;Supported standing (teeth care-standing and washed face-sitting) Upper Body Dressing: Set up;Supervision/safety Where Assessed - Upper Body Dressing: Supported sitting Toilet Transfer: Min Pension scheme managerguard Toilet Transfer Method: Sit to Barist[place]and Toilet Transfer Equipment: Regular height toilet;Grab bars Tub/Shower Transfer Method: Not assessed Transfers/Ambulation Related  to ADLs: Min guard ADL Comments: Educated on exercises for UE's. Pt performed teeth care at sink-education on energy conservation during session. Pt washed face sitting in chair. Pt able to cross legs to reach socks. explained benefit of doing activities to increase activity tolerance, but also explained energy conservation techniques as well.    OT Diagnosis: Generalized weakness;Acute pain  OT Problem List: Decreased strength;Impaired balance (sitting and/or standing);Decreased activity tolerance;Decreased knowledge of use of DME or AE;Decreased knowledge of precautions;Pain;Cardiopulmonary status limiting activity OT Treatment Interventions: Self-care/ADL training;Therapeutic exercise;Energy conservation;DME and/or AE instruction;Therapeutic activities;Patient/family education;Balance training   OT Goals(Current goals can be found in the care plan section) Acute Rehab OT Goals Patient Stated Goal: not stated OT Goal Formulation: With patient Time For Goal Achievement: 10/08/13 Potential to Achieve Goals: Good ADL Goals Pt Will Perform Grooming: with modified independence;standing Pt Will Perform Upper Body Bathing: with modified independence;sitting Pt Will Perform Lower Body Bathing: with modified independence;sit to/from stand Pt Will Transfer to Toilet: with modified independence;ambulating;regular height toilet Pt Will Perform Toileting - Clothing Manipulation and hygiene: with modified independence;sit to/from stand Additional ADL Goal #1: Pt will independently verbalize and demonstrate 3/3 energy conservation techniques.    Visit Information  Last OT Received On: 09/24/13 Assistance Needed: +1 History of Present Illness: 48 yo male smoker presented with abdominal pain from perforated gastric ulcer in setting of NSAID.  PCCM consulted to assist with septic shock and respiratory failure management.  Reintubated  1/24 and reextubated 1/26.       Prior Functioning     Home  Living Family/patient expects to be discharged to:: Private residence Living Arrangements: Other (Comment) (daughter lives with him) Available Help at Discharge: Family (daughter can assist and per pt is the only one able to help) Type of Home:  Mobile home Home Access: Stairs to enter Entrance Stairs-Number of Steps: 2 Entrance Stairs-Rails: Right;Left Home Layout: One level Home Equipment: None Prior Function Level of Independence: Independent Communication Communication: No difficulties         Vision/Perception     Cognition  Cognition Arousal/Alertness: Awake/alert Behavior During Therapy: WFL for tasks assessed/performed Overall Cognitive Status: Within Functional Limits for tasks assessed    Extremity/Trunk Assessment Upper Extremity Assessment Upper Extremity Assessment: Generalized weakness Lower Extremity Assessment Lower Extremity Assessment: Defer to PT evaluation     Mobility             Exercises  Bed Mobility Overal bed mobility: Needs Assistance Bed Mobility: Supine to Sit Supine to sit: Supervision Transfers Overall transfer level: Needs assistance Equipment used: Rolling walker (2 wheeled) Transfers: Sit to/from Stand Sit to Stand: Min guard General transfer comment: cues for technique.     Pt performed a few reps of shoulder flexion, elbow flexion/extension with level 2 theraband.        End of Session OT - End of Session Equipment Utilized During Treatment: Rolling walker;Gait belt Activity Tolerance: Patient limited by fatigue Patient left: in chair;with call bell/phone within reach;with family/visitor present Nurse Communication: Other (comment) (HR)  GO     Earlie Raveling OTR/L [phone] 09/24/2013, 4:22 PM

## 2013-09-24 NOTE — Clinical Social Work Placement (Signed)
Clinical Social Work Department CLINICAL SOCIAL WORK PLACEMENT NOTE 09/24/2013  Patient:  James LeydenHAND,James Mccormick  Account Number:  [id number] Admit date:  08/28/2013  Clinical Social Worker:  Cherre BlancJOSEPH BRYANT Cornelius Schuitema, [place]  Date/time:  09/24/2013 01:19 PM  Clinical Social Work is seeking post-discharge placement for this patient at the following level of care:   SKILLED NURSING   (*CSW will update this form in Epic as items are completed)   09/24/2013  Patient/family provided with [facility] Department of Clinical Social Work's list of facilities offering this level of care within the geographic area requested by the patient (or if unable, by the patient's family).  09/24/2013  Patient/family informed of their freedom to choose among providers that offer the needed level of care, that participate in Medicare, Medicaid or managed care program needed by the patient, have an available bed and are willing to accept the patient.  09/24/2013  Patient/family informed of [facility]' ownership interest in [facility], as well as of the fact that they are under no obligation to receive care at this facility.  PASARR submitted to EDS on 09/18/2013 PASARR number received from EDS on 09/18/2013  FL2 transmitted to all facilities in geographic area requested by pt/family on  09/24/2013 FL2 transmitted to all facilities within larger geographic area on 09/24/2013  Patient informed that his/her managed care company has contracts with or will negotiate with  certain facilities, including the following:     Patient/family informed of bed offers received:   Patient chooses bed at  Physician recommends and patient chooses bed at    Patient to be transferred to  on   Patient to be transferred to facility by   The following physician request were entered in Epic:   Additional Comments:   Roddie McBryant Theadore Blunck, [place], [place], [phone]

## 2013-09-24 NOTE — Clinical Social Work Psychosocial (Signed)
Clinical Social Work Department BRIEF PSYCHOSOCIAL ASSESSMENT 09/24/2013  Patient:  James Mccormick, James Mccormick     Account Number:  [id number]     Admit date:  08/28/2013  Clinical Social Worker:  Lovey Newcomer  Date/Time:  09/24/2013 01:12 PM  Referred by:  Physician  Date Referred:  09/24/2013 Referred for  SNF Placement   Other Referral:   Interview type:  Patient Other interview type:   Patient alert and oriented at time of assessment.    PSYCHOSOCIAL DATA Living Status:  FAMILY Admitted from facility:   Level of care:   Primary support name:  Charlie Kestenbaum Primary support relationship to patient:  CHILD, ADULT Degree of support available:   Support is good.    CURRENT CONCERNS Current Concerns  Post-Acute Placement   Other Concerns:    SOCIAL WORK ASSESSMENT / PLAN Per RNCM patient will need SNF placement. CSW met with patient at bedside to complete assessment. Patient was sitting upright in chair. Patient is agreeable to fax out to look for SNF options. Patient understands that [place] will have to look for options beyond [facility] as he does not have insurance and his medical condition limits the options he will have. In addition to having no insurance, patient is currently on NG tube for suctioning, and TNA for nutrition. Patient requested CSW contact patient's daughter. CSW called patient's daughter, but there was no answer, CSW let message and will wait for callback. Per RNCMs note LTACH is also being looked at as option. CSW will assist with DC needs.   Assessment/plan status:  Psychosocial Support/Ongoing Assessment of Needs Other assessment/ plan:   Complete FL2, Fax, PASRR   Information/referral to community resources:   CSW contact information given to daughter.    PATIENT'S/FAMILY'S RESPONSE TO PLAN OF CARE: Patient is agreeable to SNF fax out to determine what SNF options he will have. Patient was pleasant, appropriate, and appreciative of CSW  contact. Message was left with daughter. CSW will assist with DC.       Liz Beach, [place], [place], [phone]

## 2013-09-24 NOTE — Progress Notes (Signed)
Physical Therapy Treatment Patient Details Name: James LeydenChristopher D Mccormick MRN: [id number] DOB: 10/22/1965 Today's Date: 09/24/2013 Time: [id number] PT Time Calculation (min): 32 min  PT Assessment / Plan / Recommendation  History of Present Illness 48 yo male smoker presented with abdominal pain from perforated gastric ulcer in setting of NSAID.  PCCM consulted to assist with septic shock and respiratory failure management.  Reintubated  1/24 and reextubated 1/26.   PT Comments   Pt progressing slowly, is becoming more independent with mobility but is still very limited by activity tolerance and tachycardia as well as balance issues. PT will continue to follow.  Follow Up Recommendations  CIR     Does the patient have the potential to tolerate intense rehabilitation     Barriers to Discharge        Equipment Recommendations  Rolling walker with 5" wheels    Recommendations for Other Services Rehab consult  Frequency Min 3X/week   Progress towards PT Goals Progress towards PT goals: Progressing toward goals  Plan Current plan remains appropriate    Precautions / Restrictions Precautions Precautions: Fall Precaution Comments: watch HR (tachy) Required Braces or Orthoses: Other Brace/Splint (abdominal binder) Other Brace/Splint: abdominal binder Restrictions Weight Bearing Restrictions: No Other Position/Activity Restrictions: NG tube   Pertinent Vitals/Pain 130 bpm with ambulation    Mobility  Bed Mobility Overal bed mobility: Needs Assistance Bed Mobility: Sidelying to Sit Supine to sit: Supervision;HOB elevated General bed mobility comments: vc's for [place] placement to help him be more independent but no physical assist required with HOB elevated to 30 degrees and bed rail used Transfers Overall transfer level: Needs assistance Equipment used: Rolling walker (2 wheeled) Transfers: Sit to/from Stand Sit to Stand: Min assist General transfer comment: assist to come forward and  vc's for [place] placement Ambulation/Gait Ambulation/Gait assistance: Min assist Ambulation Distance (Feet): 110 Feet Assistive device: Rolling walker (2 wheeled) Gait Pattern/deviations: Step-through pattern;Decreased stride length Gait velocity: slow Gait velocity interpretation: <1.8 ft/sec, indicative of risk for recurrent falls General Gait Details: 2 standing rest breaks, HR 130 bpm and pt with quick fatigue    Exercises General Exercises - Lower Extremity Ankle Circles/Pumps: AROM;Both;10 reps Heel Slides: AROM;Both;10 reps;Seated Straight Leg Raises: AROM;Both;10 reps;Seated   PT Diagnosis:    PT Problem List:   PT Treatment Interventions:     PT Goals (current goals can now be found in the care plan section) Acute Rehab PT Goals Patient Stated Goal: Need to get back to work PT Goal Formulation: With patient Time For Goal Achievement: 10/08/13 Potential to Achieve Goals: Good  Visit Information  Last PT Received On: 09/24/13 Assistance Needed: +1 History of Present Illness: 48 yo male smoker presented with abdominal pain from perforated gastric ulcer in setting of NSAID.  PCCM consulted to assist with septic shock and respiratory failure management.  Reintubated  1/24 and reextubated 1/26.    Subjective Data  Subjective: rather flat affect today Patient Stated Goal: Need to get back to work   Cognition  Cognition Arousal/Alertness: Awake/alert Behavior During Therapy: WFL for tasks assessed/performed Overall Cognitive Status: Within Functional Limits for tasks assessed    Balance  Balance Overall balance assessment: Needs assistance Sitting-balance support: No upper extremity supported;Feet supported Sitting balance-Leahy Scale: Good Standing balance support: Bilateral upper extremity supported;During functional activity Standing balance-Leahy Scale: Good Standing balance comment: shaky in standing  End of Session PT - End of Session Equipment Utilized During  Treatment: Gait belt Activity Tolerance: Patient limited by fatigue Patient  left: in chair;with call bell/phone within reach Nurse Communication: Mobility status   GP   Lyanne Co, PT  Acute Rehab Services  [phone]   Lyanne Co 09/24/2013, 1:46 PM

## 2013-09-24 NOTE — Progress Notes (Addendum)
PARENTERAL NUTRITION CONSULT NOTE - FOLLOW UP  Pharmacy Consult for TPN Indication: Prolonged ileus  No Known Allergies  Patient Measurements: Height: 5\' 11"  (180.3 cm) Weight: 208 lb 1.8 oz (94.4 kg) IBW/kg (Calculated) : 75.3 Vital Signs: Temp: 98.9 F (37.2 C) (02/04 0519) Temp src: Oral (02/04 0519) BP: 132/88 mmHg (02/04 0519) Pulse Rate: 109 (02/04 0519) Intake/Output from previous day: 02/03 0701 - 02/04 0700 In: 2718.5 [I.V.:190; NG/GT:70; TPN:2458.5] Out: 2438.5 [Urine:2050; Emesis/NG output:375; Drains:13.5] Intake/Output from this shift:    Labs:  Recent Labs  09/22/13 0400 09/24/13 0515  WBC 14.8* 14.5*  HGB 11.0* 11.1*  HCT 33.2* 33.6*  PLT 310 293    Recent Labs  09/22/13 0400 09/23/13 0500 09/24/13 0515  NA 142 140 139  K 4.7 4.8 4.4  CL 106 105 105  CO2 21 22 21   GLUCOSE 125* 127* 134*  BUN 45* 50* 52*  CREATININE 1.46* 1.49* 1.47*  CALCIUM 8.2* 8.2* 8.1*  MG 2.3 2.4 2.1  PHOS 4.9* 5.1* 3.8  PROT 7.3  --   --   ALBUMIN 2.3*  --   --   AST 17  --   --   ALT 16  --   --   ALKPHOS 98  --   --   BILITOT 0.5  --   --   PREALBUMIN 19.3  --   --   TRIG 117  --   --    Estimated Creatinine Clearance: 71.3 ml/min (by C-G formula based on Cr of 1.47).    Recent Labs  09/23/13 1742 09/23/13 2356 09/24/13 0517  GLUCAP 117* 125* 134*   Insulin Requirements in the past 24 hours:  24 units insulin in TPN + 4 units from SSI  Current Nutrition:  Clinimix E 5/15 at 15500mL/hr + lipid emulsion 20% at 4410mL/hr NPO  Nutritional Goals:  Goal 2250-2450 kcal and 150-160 gm protein per RD 09/15/13  Assessment:  48 y.o. male presented to ED 1/8 with abd pain. CT consistent with perforated stomach- ulcer due to NSAID use, pneumoperitoneum, peritonitis. Taken emergently to OR where he underwent exp lap, debridement of necrotic stomach, repair of perforated stomach. Contrast study showed no leak from stomach or small bowel.  CT scan 1/31 poss  persistent gastric perf, fluid collection 4.5x4.9, and 3rd collection between bladder and rectum. 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 Will require NPO/NG/TNA for a long time.   GI: Back to NPO status 1/31. 2/1. Large stomach wall defect and fistula. CT scan 1/31 - likely persistent gastric perforation, fluid collections that may be abscesses. 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 Will require NPO/NG/TNA for a long time.   Nutrition: prealbumin up to 19.3 from 10.7, moving in the right direction  Endo: No h/o DM. CBGs at goal + insulin in TPN  Lytes: lytes all improved after "lyte holiday" yesterday,  K 4.4, Phos 3.8  Mag 2.1, corr Ca 9.46, CaxPhos = 36   Renal: Scr 1.47- stable today. Good UOP.  Hepatobil: LFTs wnl. Trig down to 117.  TPN Access: triple lumen PICC 1/20; replaced 1/27  TPN day#: 26  Plan:  1. Add back electrolytes; change to Clinimix E 5/15 (with electrolytes) + lipids 2. Change to cyclic rate over 18 hours to provide 2400 mls = full support.    3. Continue multivitamin and full trace elements in TPN 4. continue regular insulin in TPN to 24 units, customize times for CBGs to monitor cyclic rate 5. If he  tolerates 18 hrs cycle, will advance to 12 hr cycle 6. TPN labs ordered for Thursday  Herby Abraham, Pharm.D. [phone] 09/24/2013 7:35 AM   Addendum: To add zosyn for gastric fistula for general intraperitoneal coverage. WBC is 14.5 (downward trend).  Afebrile. On fluconazole and flagy (+Cdiff 1/21).  2/1 peritoneal cavity cx NG x 2 days.  Wt 94 kg, creat cl 71 ml/min. Plan: zosyn 3.375 gm IV q8h, infuse each dose over 4 hrs. Herby Abraham, Pharm.D. [phone] 09/24/2013 8:36 AM

## 2013-09-24 NOTE — Progress Notes (Signed)
Thought he was on zosyn already, needs zosyn/diflucan for ugi fistula for now with elevated wbc, continue flagyl while on these abx Will repeat ct tomorrow to ensure everything adequately drained Needs to be oob

## 2013-09-24 NOTE — Progress Notes (Signed)
Orthopedic Tech Progress Note Patient Details:  Pamalee LeydenChristopher D Bonnette 11/10/1965 [id number]  Patient ID: Pamalee Leydenhristopher D Kohls, male   DOB: 01/20/1966, [age] y.o.   MRN: [id number]   Shawnie PonsCammer, Jaylianna Tatlock Carol 09/24/2013, 12:49 PMTrapeze bar

## 2013-09-24 NOTE — Progress Notes (Signed)
27 Days Post-Op  Subjective: No real complaints, wants to know if he can do something to help make process work better. Drainage from NG is very thick, and needs to be irrigated frequently.  Objective: Vital signs in last 24 hours: Temp:  [98 F (36.7 C)-99.9 F (37.7 C)] 98.9 F (37.2 C) (02/04 0519) Pulse Rate:  [102-117] 109 (02/04 0519) Resp:  [17-22] 18 (02/04 0519) BP: (125-144)/(85-95) 132/88 mmHg (02/04 0519) SpO2:  [94 %-98 %] 96 % (02/04 0519) Weight:  [94.4 kg (208 lb 1.8 oz)-95.3 kg (210 lb 1.6 oz)] 94.4 kg (208 lb 1.8 oz) (02/04 0500) Last BM Date: 09/20/13 13ml from drains,  On TNA Afebrile, "Tachycardic" BP borderline elevated Diflucan for the last 4 day Flagyl last 14 days  Iipenem-cilastatin 500 MG, 9 day (1/14-1/22/15) not receiving now Mcafungin 1/09-1/20/15 not receiving now Vancomycin Oral 1/20-1/30/15 not receiving now Creatinine is stable, WBC remains elevated. Intake/Output from previous day: 02/03 0701 - 02/04 0700 In: 2718.5 [I.V.:190; NG/GT:70; TPN:2458.5] Out: 2438.5 [Urine:2050; Emesis/NG output:375; Drains:13.5] Intake/Output this shift:    PERITONEAL CAVITY Fluid from IR drain 09/21/13   Special Requests  Normal   Gram Stain  ABUNDANT WBC PRESENT, PREDOMINANTLY PMN NO SQUAMOUS EPITHELIAL CELLS SEEN NO ORGANISMS SEEN Performed at [facility]  Culture  NO GROWTH 2 DAYS Performed at [facility]  Report Status  PENDING 09/24/13 7:50 AM      General appearance: alert, cooperative and no distress Resp: clear to auscultation bilaterally GI: soft open abdomen with dehiscence, drain noted at the top of the open wound now.  No BM/flatus Skin: His skin is very dry , he is getting a sore on his head and has a dressing over it.  Lab Results:   Recent Labs  09/22/13 0400 09/24/13 0515  WBC 14.8* 14.5*  HGB 11.0* 11.1*  HCT 33.2* 33.6*  PLT 310 293    BMET  Recent Labs  09/23/13 0500 09/24/13 0515  NA 140 139  K  4.8 4.4  CL 105 105  CO2 22 21  GLUCOSE 127* 134*  BUN 50* 52*  CREATININE 1.49* 1.47*  CALCIUM 8.2* 8.1*   PT/INR No results found for this basename: LABPROT, INR,  in the last 72 hours   Recent Labs Lab 09/18/13 0413 09/18/13 0545 09/22/13 0400  AST 16 16 17   ALT 10 13 16   ALKPHOS 83 88 98  BILITOT 0.4 0.4 0.5  PROT 5.9* 6.4 7.3  ALBUMIN 1.8* 2.0* 2.3*     Lipase     Component Value Date/Time   LIPASE 14 08/29/2013 0930     Studies/Results: No results found.  Medications: . antiseptic oral rinse  15 mL Mouth Rinse q12n4p  . chlorhexidine  15 mL Mouth Rinse BID  . enoxaparin (LOVENOX) injection  40 mg Subcutaneous Q24H  . fluconazole (DIFLUCAN) IV  200 mg Intravenous Q24H  . insulin aspart  0-15 Units Subcutaneous Q6H  . metoprolol  5 mg Intravenous Q6H  . metronidazole  500 mg Intravenous Q8H  . pantoprazole (PROTONIX) IV  40 mg Intravenous Q12H  . sodium chloride  10-40 mL Intracatheter Q12H    Assessment/Plan Perforated lesser curvature of the stomach with necrosis., hx of NSAID use. s/p 1. Exploratory laparotomy. 2. Debridement of necrotic stomach with scissors. 3. Primary repair of perforated stomach in 2 layers. 08/29/2013, Atilano InaEric M Wilson, MD  CT scan 09/20/13 shows possible persistent gastric perforation, fluid collection 5.5 x 4.9 cm gastrosplenic ligament, and a third  collection between bladder and rectum. (CT abscess drain attempted, Guidewire easily passed into stomach through large wall defect. Drain not placed 09/21/13 by IR) Sepsis  Post op AF with RVR/hypotension  NSTEMI from demand ischemia  Acute renal insuffincey  Post op respiratory failure/ARDS/ extubated 09/08/13  L radial artery subtotal occlusion  Encephalopathy  C diff colitis  hypersomnolence and brady arrest 09/13/13, re intubated/ extubated again 09/15/13  GERD  Tobacco use  Remote hx of ETOH use, none for 13 years  Depression on high dose Zoloft for some time.    Plan:  Start Zosyn  for general intraperitoneal coverage.  CT scan tomorrow, air mattress and over head trapeze. Irrigate NG q6h, OT and PT,   I will check on activity level I think he's at bedrest now, so OT and PT can work to help with deconditioning that is going to occur with this prolonged illness.     LOS: 27 days    James Mccormick 09/24/2013

## 2013-09-25 ENCOUNTER — Inpatient Hospital Stay (HOSPITAL_COMMUNITY): Payer: Medicaid Other

## 2013-09-25 LAB — PHOSPHORUS: Phosphorus: 4.3 mg/dL (ref 2.3–4.6)

## 2013-09-25 LAB — COMPREHENSIVE METABOLIC PANEL
ALT: 17 U/L (ref 0–53)
AST: 20 U/L (ref 0–37)
Albumin: 2.3 g/dL — ABNORMAL LOW (ref 3.5–5.2)
Alkaline Phosphatase: 118 U/L — ABNORMAL HIGH (ref 39–117)
BUN: 49 mg/dL — ABNORMAL HIGH (ref 6–23)
CALCIUM: 8 mg/dL — AB (ref 8.4–10.5)
CO2: 18 mEq/L — ABNORMAL LOW (ref 19–32)
Chloride: 104 mEq/L (ref 96–112)
Creatinine, Ser: 1.37 mg/dL — ABNORMAL HIGH (ref 0.50–1.35)
GFR calc Af Amer: 69 mL/min — ABNORMAL LOW (ref >90)
GFR calc non Af Amer: 59 mL/min — ABNORMAL LOW (ref >90)
Glucose, Bld: 144 mg/dL — ABNORMAL HIGH (ref 70–99)
Potassium: 4.5 mEq/L (ref 3.7–5.3)
SODIUM: 138 meq/L (ref 137–147)
TOTAL PROTEIN: 7.3 g/dL (ref 6.0–8.3)
Total Bilirubin: 0.5 mg/dL (ref 0.3–1.2)

## 2013-09-25 LAB — CBC
HEMATOCRIT: 32.7 % — AB (ref 39.0–52.0)
Hemoglobin: 10.7 g/dL — ABNORMAL LOW (ref 13.0–17.0)
MCH: 29.6 pg (ref 26.0–34.0)
MCHC: 32.7 g/dL (ref 30.0–36.0)
MCV: 90.6 fL (ref 78.0–100.0)
PLATELETS: 283 10*3/uL (ref 150–400)
RBC: 3.61 MIL/uL — ABNORMAL LOW (ref 4.22–5.81)
RDW: 14.8 % (ref 11.5–15.5)
WBC: 13.4 10*3/uL — AB (ref 4.0–10.5)

## 2013-09-25 LAB — GLUCOSE, CAPILLARY
Glucose-Capillary: 101 mg/dL — ABNORMAL HIGH (ref 70–99)
Glucose-Capillary: 128 mg/dL — ABNORMAL HIGH (ref 70–99)
Glucose-Capillary: 177 mg/dL — ABNORMAL HIGH (ref 70–99)
Glucose-Capillary: 181 mg/dL — ABNORMAL HIGH (ref 70–99)
Glucose-Capillary: 95 mg/dL (ref 70–99)

## 2013-09-25 LAB — MAGNESIUM: Magnesium: 2.2 mg/dL (ref 1.5–2.5)

## 2013-09-25 MED ORDER — FAT EMULSION 20 % IV EMUL
240.0000 mL | INTRAVENOUS | Status: AC
  Administered 2013-09-25: 240 mL via INTRAVENOUS
  Filled 2013-09-25 (×2): qty 250

## 2013-09-25 MED ORDER — TRACE MINERALS CR-CU-F-FE-I-MN-MO-SE-ZN IV SOLN
INTRAVENOUS | Status: AC
  Administered 2013-09-25: 18:00:00 via INTRAVENOUS
  Filled 2013-09-25: qty 2400

## 2013-09-25 MED ORDER — IOHEXOL 300 MG/ML  SOLN
80.0000 mL | Freq: Once | INTRAMUSCULAR | Status: AC | PRN
  Administered 2013-09-25: 80 mL via INTRAVENOUS

## 2013-09-25 MED ORDER — IOHEXOL 300 MG/ML  SOLN
25.0000 mL | INTRAMUSCULAR | Status: AC
  Administered 2013-09-25 (×2): 25 mL via ORAL

## 2013-09-25 NOTE — Progress Notes (Signed)
Noted pt with continued medical issues, NPO, TNA, NGT and remains tachycardic with exertion. INpt rehab venue is not appropriate discharge venue at this time. Recommend LTACH. I have discussed with RN CM, Kristi. We will sign off. [phone]

## 2013-09-25 NOTE — Progress Notes (Signed)
PARENTERAL NUTRITION CONSULT NOTE - FOLLOW UP  Pharmacy Consult for TPN Indication: Prolonged ileus  No Known Allergies  Patient Measurements: Height: '5\' 11"'  (180.3 cm) Weight: 210 lb 15.7 oz (95.7 kg) IBW/kg (Calculated) : 75.3 Vital Signs: Temp: 98.6 F (37 C) (02/05 1000) Temp src: Oral (02/05 1000) BP: 136/84 mmHg (02/05 1000) Pulse Rate: 108 (02/05 1000) Intake/Output from previous day: 02/04 0701 - 02/05 0700 In: 4495 [I.V.:687.3; NG/GT:200; IV Piggyback:550; TPN:3027.6] Out: 2925 [Urine:2325; Emesis/NG output:550; Drains:50] Intake/Output from this shift: Total I/O In: 20 [I.V.:20] Out: 502 [Urine:500; Stool:2]  Labs:  Recent Labs  09/24/13 0515 09/25/13 0420  WBC 14.5* 13.4*  HGB 11.1* 10.7*  HCT 33.6* 32.7*  PLT 293 283    Recent Labs  09/23/13 0500 09/24/13 0515 09/25/13 0420  NA 140 139 138  K 4.8 4.4 4.5  CL 105 105 104  CO2 22 21 18*  GLUCOSE 127* 134* 144*  BUN 50* 52* 49*  CREATININE 1.49* 1.47* 1.37*  CALCIUM 8.2* 8.1* 8.0*  MG 2.4 2.1 2.2  PHOS 5.1* 3.8 4.3  PROT  --   --  7.3  ALBUMIN  --   --  2.3*  AST  --   --  20  ALT  --   --  17  ALKPHOS  --   --  118*  BILITOT  --   --  0.5   Estimated Creatinine Clearance: 77 ml/min (by C-G formula based on Cr of 1.37).    Recent Labs  09/24/13 1613 09/24/13 2017 09/25/13 0034  GLUCAP 134* 140* 128*   Insulin Requirements in the past 24 hours:  24 units insulin in TPN + 4 units from SSI  Current Nutrition:  Clinimix E 5/15 at 166m/hr + lipid emulsion 20% at 165mhr NPO  Nutritional Goals:  Goal 2250-2450 kcal and 130-140 gm protein per RD 09/23/13  Assessment:  [age].o. male presented to ED 1/8 with abd pain. CT consistent with perforated stomach- ulcer due to NSAID use, pneumoperitoneum, peritonitis. Taken emergently to OR where he underwent exp lap, debridement of necrotic stomach, repair of perforated stomach. Contrast study showed no leak from stomach or small bowel.  CT  scan 1/31 poss persistent gastric perf, fluid collection 4.5x4.9, and 3rd collection between bladder and rectum. 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 Will require NPO/NG/TNA for a long time.   GI: Back to NPO status 1/31. 2/1. Large stomach wall defect and fistula. CT scan 1/31 - likely persistent gastric perforation, fluid collections that may be abscesses. 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 Will require NPO/NG/TNA for a long time.   Nutrition: prealbumin up to 19.3 from 10.7, moving in the right direction  Endo: No h/o DM. CBGs at goal + insulin in TPN  Lytes: K, Mg and phos all within normal limits  Renal: Scr 1.37- stable today. Good UOP.  ID:  Vanc 1/8>>1/21 Zosyn 1/8>>1/14 Micafungin 1/9>>1/21 Primaxin 1/14>>1/23  Vanc PO 1/21>>1/31 Flagyl 1/21>> Zosyn 2/4>> Fluconazole 1/31>>  Cultures: 2/1 peritoneal cavity>>candida species 1/30 urine>> NG 1/21 CDiff Positive 1/16 Trach aspirate no growth 1/9 Blood cultures x 2>> no growth  Hepatobil: LFTs wnl except alk phos of 118. Trig down to 117.  TPN Access: triple lumen PICC 1/20; replaced 1/27  TPN day#: 27  Plan:  1. Continue to Clinimix E 5/15 (with electrolytes) + lipids 2. Change to cyclic rate over 12 hours to provide 2400 mls = full support.    3. Continue multivitamin and full  trace elements in TPN 4. continue regular insulin in TPN at 24 units, customize times for CBGs to monitor cyclic rate 5. Continue Zosyn at current dose.  09/25/2013 11:27 AM

## 2013-09-25 NOTE — Progress Notes (Signed)
Agree with above, follow up ct today

## 2013-09-25 NOTE — Progress Notes (Addendum)
UR completed.  Note mention of LTACH.  [facility] liaison is working on the possibility of charity case for patient with her corporate reps and will advise us when she gets a decision from them.   Carlyle LipaMichelle Ashana Tullo, RN BSN [facility] CCM Trauma/Neuro ICU Case Manager [phone]

## 2013-09-25 NOTE — Progress Notes (Signed)
Patient ID: James Mccormick, male   DOB: 1966/06/05, 48 y.o.   MRN: [id number]  Subjective: Denies cp, sob, palpitations.  On TPN, NGT to LWIS.  He remains tachycardic, afebrile, BP is stable.  White count trending down 14.5--->13.4k.  Due for a CT scan today.  Reports he ambulated 3x yesterday.    Objective:  Vital signs:  Filed Vitals:   09/24/13 1840 09/24/13 2130 09/25/13 0500 09/25/13 0554  BP: 132/89 138/93  128/86  Pulse: 100 113  105  Temp: 98.8 F (37.1 C) 98.8 F (37.1 C)  98.8 F (37.1 C)  TempSrc: Oral Oral  Oral  Resp: '16 18  20  ' Height:      Weight:   210 lb 15.7 oz (95.7 kg)   SpO2: 98% 99%  98%    Last BM Date: 09/20/13  Intake/Output   Yesterday:  02/04 0701 - 02/05 0700 In: 8756 [I.V.:687.3; NG/GT:200; IV Piggyback:550; TPN:3027.6] Out: 2925 [Urine:2325; Emesis/NG output:550; Drains:50] This shift:    I/O last 3 completed shifts: In: 5963.5 [I.V.:687.3; Other:30; NG/GT:200; IV Piggyback:550] Out: 4332 [Urine:2975; Emesis/NG output:700; Drains:60]     Physical Exam:  General: Pt awake/alert/oriented and in no acute distress  Chest: few scattered rhonchi. No chest wall pain w good excursion  CV: Pulses intact. Regular rhythm. tachycardic  MS: Normal AROM mjr joints. No obvious deformity   Abdomen:  Bowel sounds are present.  Non tender.  Bilateral drains with purulent output.  Ext: SCDs BLE. No mjr edema. No cyanosis  Skin: No petechiae / purpura    Problem List:   Principal Problem:   Perforated stomach Active Problems:   Septic shock(785.52)   Severe sepsis(995.92)   ARDS (adult respiratory distress syndrome)   Hypocalcemia   Hypophosphatemia   Protein-calorie malnutrition, moderate   Atrial fibrillation   Pleural effusion   C. difficile colitis    Results:   Labs: Results for orders placed during the hospital encounter of 08/28/13 (from the past 48 hour(s))  GLUCOSE, CAPILLARY     Status: Abnormal   Collection Time   09/23/13 12:17 PM      Result Value Range   Glucose-Capillary 133 (*) 70 - 99 mg/dL  GLUCOSE, CAPILLARY     Status: Abnormal   Collection Time    09/23/13  5:42 PM      Result Value Range   Glucose-Capillary 117 (*) 70 - 99 mg/dL  GLUCOSE, CAPILLARY     Status: Abnormal   Collection Time    09/23/13 11:56 PM      Result Value Range   Glucose-Capillary 125 (*) 70 - 99 mg/dL  BASIC METABOLIC PANEL     Status: Abnormal   Collection Time    09/24/13  5:15 AM      Result Value Range   Sodium 139  137 - 147 mEq/L   Potassium 4.4  3.7 - 5.3 mEq/L   Chloride 105  96 - 112 mEq/L   CO2 21  19 - 32 mEq/L   Glucose, Bld 134 (*) 70 - 99 mg/dL   BUN 52 (*) 6 - 23 mg/dL   Creatinine, Ser 1.47 (*) 0.50 - 1.35 mg/dL   Calcium 8.1 (*) 8.4 - 10.5 mg/dL   GFR calc non Af Amer 54 (*) >90 mL/min   GFR calc Af Amer 63 (*) >90 mL/min   Comment: (NOTE)     The eGFR has been calculated using the CKD EPI equation.     This calculation has not  been validated in all clinical situations.     eGFR's persistently <90 mL/min signify possible Chronic Kidney     Disease.  MAGNESIUM     Status: None   Collection Time    09/24/13  5:15 AM      Result Value Range   Magnesium 2.1  1.5 - 2.5 mg/dL  PHOSPHORUS     Status: None   Collection Time    09/24/13  5:15 AM      Result Value Range   Phosphorus 3.8  2.3 - 4.6 mg/dL  CBC     Status: Abnormal   Collection Time    09/24/13  5:15 AM      Result Value Range   WBC 14.5 (*) 4.0 - 10.5 K/uL   RBC 3.67 (*) 4.22 - 5.81 MIL/uL   Hemoglobin 11.1 (*) 13.0 - 17.0 g/dL   HCT 33.6 (*) 39.0 - 52.0 %   MCV 91.6  78.0 - 100.0 fL   MCH 30.2  26.0 - 34.0 pg   MCHC 33.0  30.0 - 36.0 g/dL   RDW 14.9  11.5 - 15.5 %   Platelets 293  150 - 400 K/uL  GLUCOSE, CAPILLARY     Status: Abnormal   Collection Time    09/24/13  5:17 AM      Result Value Range   Glucose-Capillary 134 (*) 70 - 99 mg/dL  GLUCOSE, CAPILLARY     Status: Abnormal   Collection Time    09/24/13   1:12 PM      Result Value Range   Glucose-Capillary 135 (*) 70 - 99 mg/dL  GLUCOSE, CAPILLARY     Status: Abnormal   Collection Time    09/24/13  4:13 PM      Result Value Range   Glucose-Capillary 134 (*) 70 - 99 mg/dL  GLUCOSE, CAPILLARY     Status: Abnormal   Collection Time    09/24/13  8:17 PM      Result Value Range   Glucose-Capillary 140 (*) 70 - 99 mg/dL  GLUCOSE, CAPILLARY     Status: Abnormal   Collection Time    09/25/13 12:34 AM      Result Value Range   Glucose-Capillary 128 (*) 70 - 99 mg/dL  COMPREHENSIVE METABOLIC PANEL     Status: Abnormal   Collection Time    09/25/13  4:20 AM      Result Value Range   Sodium 138  137 - 147 mEq/L   Potassium 4.5  3.7 - 5.3 mEq/L   Chloride 104  96 - 112 mEq/L   CO2 18 (*) 19 - 32 mEq/L   Glucose, Bld 144 (*) 70 - 99 mg/dL   BUN 49 (*) 6 - 23 mg/dL   Creatinine, Ser 1.37 (*) 0.50 - 1.35 mg/dL   Calcium 8.0 (*) 8.4 - 10.5 mg/dL   Total Protein 7.3  6.0 - 8.3 g/dL   Albumin 2.3 (*) 3.5 - 5.2 g/dL   AST 20  0 - 37 U/L   ALT 17  0 - 53 U/L   Alkaline Phosphatase 118 (*) 39 - 117 U/L   Total Bilirubin 0.5  0.3 - 1.2 mg/dL   GFR calc non Af Amer 59 (*) >90 mL/min   GFR calc Af Amer 69 (*) >90 mL/min   Comment: (NOTE)     The eGFR has been calculated using the CKD EPI equation.     This calculation has not been validated in all clinical  situations.     eGFR's persistently <90 mL/min signify possible Chronic Kidney     Disease.  MAGNESIUM     Status: None   Collection Time    09/25/13  4:20 AM      Result Value Range   Magnesium 2.2  1.5 - 2.5 mg/dL  PHOSPHORUS     Status: None   Collection Time    09/25/13  4:20 AM      Result Value Range   Phosphorus 4.3  2.3 - 4.6 mg/dL  CBC     Status: Abnormal   Collection Time    09/25/13  4:20 AM      Result Value Range   WBC 13.4 (*) 4.0 - 10.5 K/uL   RBC 3.61 (*) 4.22 - 5.81 MIL/uL   Hemoglobin 10.7 (*) 13.0 - 17.0 g/dL   HCT 32.7 (*) 39.0 - 52.0 %   MCV 90.6  78.0 -  100.0 fL   MCH 29.6  26.0 - 34.0 pg   MCHC 32.7  30.0 - 36.0 g/dL   RDW 14.8  11.5 - 15.5 %   Platelets 283  150 - 400 K/uL    Imaging / Studies: No results found.  Medications / Allergies: per chart  Antibiotics: Anti-infectives   Start     Dose/Rate Route Frequency Ordered Stop   09/24/13 0930  piperacillin-tazobactam (ZOSYN) IVPB 3.375 g     3.375 g 12.5 mL/hr over 240 Minutes Intravenous 3 times per day 09/24/13 0832     09/22/13 1400  metroNIDAZOLE (FLAGYL) IVPB 500 mg     500 mg 100 mL/hr over 60 Minutes Intravenous Every 8 hours 09/22/13 1119     09/20/13 1230  fluconazole (DIFLUCAN) IVPB 200 mg     200 mg 100 mL/hr over 60 Minutes Intravenous Every 24 hours 09/20/13 1154     09/20/13 1200  fluconazole (DIFLUCAN) IVPB 100 mg  Status:  Discontinued     100 mg 50 mL/hr over 60 Minutes Intravenous Every 24 hours 09/20/13 1151 09/20/13 1154   09/10/13 2200  metroNIDAZOLE (FLAGYL) IVPB 500 mg  Status:  Discontinued     500 mg 100 mL/hr over 60 Minutes Intravenous 3 times per day 09/10/13 1802 09/22/13 1118   09/10/13 2000  vancomycin (VANCOCIN) 50 mg/mL oral solution 500 mg  Status:  Discontinued     500 mg Oral 4 times per day 09/10/13 1802 09/21/13 1151   09/10/13 1800  vancomycin (VANCOCIN) 50 mg/mL oral solution 500 mg  Status:  Discontinued     500 mg Oral 4 times per day 09/10/13 1432 09/10/13 1802   09/10/13 1600  metroNIDAZOLE (FLAGYL) IVPB 500 mg  Status:  Discontinued     500 mg 100 mL/hr over 60 Minutes Intravenous 3 times per day 09/10/13 1432 09/10/13 1802   09/09/13 1602  vancomycin (VANCOCIN) 1,250 mg in sodium chloride 0.9 % 250 mL IVPB  Status:  Discontinued     1,250 mg 166.7 mL/hr over 90 Minutes Intravenous Every 24 hours 09/09/13 1057 09/10/13 1432   09/06/13 1600  imipenem-cilastatin (PRIMAXIN) 500 mg in sodium chloride 0.9 % 100 mL IVPB  Status:  Discontinued     500 mg 200 mL/hr over 30 Minutes Intravenous 3 times per day 09/06/13 1310 09/12/13 1048    09/03/13 1100  imipenem-cilastatin (PRIMAXIN) 500 mg in sodium chloride 0.9 % 100 mL IVPB  Status:  Discontinued     500 mg 200 mL/hr over 30 Minutes Intravenous Every 6 hours  09/03/13 1007 09/06/13 1310   09/02/13 1300  vancomycin (VANCOCIN) IVPB 1000 mg/200 mL premix  Status:  Discontinued     1,000 mg 200 mL/hr over 60 Minutes Intravenous Every 12 hours 09/01/13 1527 09/07/13 1340   08/30/13 2000  vancomycin (VANCOCIN) IVPB 750 mg/150 ml premix  Status:  Discontinued     750 mg 150 mL/hr over 60 Minutes Intravenous Every 8 hours 08/30/13 1356 09/01/13 1527   08/29/13 2200  micafungin (MYCAMINE) 100 mg in sodium chloride 0.9 % 100 mL IVPB  Status:  Discontinued     100 mg 100 mL/hr over 1 Hours Intravenous Every 24 hours 08/29/13 2036 09/10/13 1432   08/29/13 0400  vancomycin (VANCOCIN) IVPB 1000 mg/200 mL premix  Status:  Discontinued     1,000 mg 200 mL/hr over 60 Minutes Intravenous Every 8 hours 08/28/13 2358 08/30/13 1356   08/29/13 0400  piperacillin-tazobactam (ZOSYN) IVPB 3.375 g  Status:  Discontinued     3.375 g 12.5 mL/hr over 240 Minutes Intravenous 3 times per day 08/28/13 2358 09/03/13 1002   08/28/13 1845  [MAR Hold]  vancomycin (VANCOCIN) IVPB 1000 mg/200 mL premix     (On MAR Hold since 08/28/13 1957)   1,000 mg 200 mL/hr over 60 Minutes Intravenous  Once 08/28/13 1831 08/28/13 1955   08/28/13 1845  [MAR Hold]  piperacillin-tazobactam (ZOSYN) IVPB 3.375 g     (On MAR Hold since 08/28/13 1957)   3.375 g 100 mL/hr over 30 Minutes Intravenous  Once 08/28/13 1831 08/28/13 2056      Assessment/Plan  Acute respiratory failure-resolved  Hx HTN-stable.  Continue with metoprolol IV. NSTEMI-ischemia demand, stable, cards follow up  Acute systolic heart failure-echo 08/29/13 with diffuse hypokinesis with EF 40-45%, poor study, recommend repeat. Will need cardiology follow up.  Septic shock 2/2 peritonitis-resolved  AKI-sCr continues to improve.  Monitor closely, getting IV  contrast today. Acute encephalopathy-resolved  Perforated gastric ulcer s/p exploratory laparotomy Dr. Redmond Pulling 08/28/13  POD#28  -CT scan 09/20/13 shows possible persistent gastric perforation, fluid collection 5.5 x 4.9 cm gastrosplenic ligament, and a third collection between bladder and rectum. (CT abscess drain attempted,  Guidewire easily passed into stomach through large wall defect. Drain not placed 09/21/13 by IR) -repeat CT today -Continue NGT to suction, TPN -restarted zosyn 2/4---> and diflucan1/31--->.  Indication: UGI fistula.   -continue with BID wet to dry dressing changes  -VTE prophylaxis; SCDs, lovenox PCM  -continue with TPN Deconditioning-PT/OT, up to chair, ambulate.  Anemia of critical illness-stable  Hypokalemia-resolved  C. Diff colitis  -treatment completed.  Continue with flagyl while on antibiotics to reduce recurrence   James Mccormick, [facility] Mccorm[facility] Pager [phone] Office [phone]  09/25/2013 8:31 AM

## 2013-09-26 ENCOUNTER — Encounter (HOSPITAL_COMMUNITY): Payer: Self-pay | Admitting: Radiology

## 2013-09-26 ENCOUNTER — Inpatient Hospital Stay (HOSPITAL_COMMUNITY): Payer: Medicaid Other

## 2013-09-26 DIAGNOSIS — F17210 Nicotine dependence, cigarettes, uncomplicated: Secondary | ICD-10-CM | POA: Diagnosis present

## 2013-09-26 DIAGNOSIS — F329 Major depressive disorder, single episode, unspecified: Secondary | ICD-10-CM | POA: Diagnosis present

## 2013-09-26 DIAGNOSIS — F32A Depression, unspecified: Secondary | ICD-10-CM | POA: Diagnosis present

## 2013-09-26 DIAGNOSIS — K651 Peritoneal abscess: Secondary | ICD-10-CM | POA: Diagnosis present

## 2013-09-26 LAB — CBC
HEMATOCRIT: 34.5 % — AB (ref 39.0–52.0)
HEMOGLOBIN: 11.5 g/dL — AB (ref 13.0–17.0)
MCH: 29.9 pg (ref 26.0–34.0)
MCHC: 33.3 g/dL (ref 30.0–36.0)
MCV: 89.8 fL (ref 78.0–100.0)
Platelets: 269 10*3/uL (ref 150–400)
RBC: 3.84 MIL/uL — ABNORMAL LOW (ref 4.22–5.81)
RDW: 14.4 % (ref 11.5–15.5)
WBC: 14.6 10*3/uL — ABNORMAL HIGH (ref 4.0–10.5)

## 2013-09-26 LAB — BASIC METABOLIC PANEL
BUN: 44 mg/dL — AB (ref 6–23)
CO2: 20 mEq/L (ref 19–32)
CREATININE: 1.21 mg/dL (ref 0.50–1.35)
Calcium: 8 mg/dL — ABNORMAL LOW (ref 8.4–10.5)
Chloride: 103 mEq/L (ref 96–112)
GFR, EST AFRICAN AMERICAN: 80 mL/min — AB (ref >90)
GFR, EST NON AFRICAN AMERICAN: 69 mL/min — AB (ref >90)
Glucose, Bld: 130 mg/dL — ABNORMAL HIGH (ref 70–99)
Potassium: 4.6 mEq/L (ref 3.7–5.3)
Sodium: 135 mEq/L — ABNORMAL LOW (ref 137–147)

## 2013-09-26 LAB — GLUCOSE, CAPILLARY
GLUCOSE-CAPILLARY: 100 mg/dL — AB (ref 70–99)
GLUCOSE-CAPILLARY: 99 mg/dL (ref 70–99)
Glucose-Capillary: 107 mg/dL — ABNORMAL HIGH (ref 70–99)
Glucose-Capillary: 176 mg/dL — ABNORMAL HIGH (ref 70–99)

## 2013-09-26 MED ORDER — TRACE MINERALS CR-CU-F-FE-I-MN-MO-SE-ZN IV SOLN
INTRAVENOUS | Status: AC
  Administered 2013-09-26: 18:00:00 via INTRAVENOUS
  Filled 2013-09-26: qty 2400

## 2013-09-26 MED ORDER — FENTANYL CITRATE 0.05 MG/ML IJ SOLN
INTRAMUSCULAR | Status: AC | PRN
  Administered 2013-09-26 (×2): 25 ug via INTRAVENOUS

## 2013-09-26 MED ORDER — MIDAZOLAM HCL 2 MG/2ML IJ SOLN
INTRAMUSCULAR | Status: AC
  Filled 2013-09-26: qty 4

## 2013-09-26 MED ORDER — VANCOMYCIN 50 MG/ML ORAL SOLUTION
250.0000 mg | Freq: Four times a day (QID) | ORAL | Status: DC
Start: 2013-09-26 — End: 2013-09-26
  Filled 2013-09-26 (×3): qty 5

## 2013-09-26 MED ORDER — METRONIDAZOLE IN NACL 5-0.79 MG/ML-% IV SOLN
500.0000 mg | Freq: Four times a day (QID) | INTRAVENOUS | Status: DC
  Administered 2013-09-26 – 2013-10-14 (×72): 500 mg via INTRAVENOUS
  Filled 2013-09-26 (×78): qty 100

## 2013-09-26 MED ORDER — FAT EMULSION 20 % IV EMUL
240.0000 mL | INTRAVENOUS | Status: AC
  Administered 2013-09-26: 240 mL via INTRAVENOUS
  Filled 2013-09-26 (×2): qty 250

## 2013-09-26 MED ORDER — MIDAZOLAM HCL 2 MG/2ML IJ SOLN
INTRAMUSCULAR | Status: AC | PRN
  Administered 2013-09-26: 1 mg via INTRAVENOUS

## 2013-09-26 MED ORDER — LIDOCAINE HCL 1 % IJ SOLN
INTRAMUSCULAR | Status: AC
  Filled 2013-09-26: qty 10

## 2013-09-26 MED ORDER — FENTANYL CITRATE 0.05 MG/ML IJ SOLN
INTRAMUSCULAR | Status: AC
  Filled 2013-09-26: qty 4

## 2013-09-26 MED ORDER — ENOXAPARIN SODIUM 40 MG/0.4ML ~~LOC~~ SOLN
40.0000 mg | SUBCUTANEOUS | Status: DC
  Administered 2013-09-27 – 2013-10-14 (×17): 40 mg via SUBCUTANEOUS
  Filled 2013-09-26 (×20): qty 0.4

## 2013-09-26 MED ORDER — SODIUM CHLORIDE 0.9 % IV SOLN
100.0000 mg | Freq: Every day | INTRAVENOUS | Status: DC
  Administered 2013-09-27 – 2013-10-14 (×18): 100 mg via INTRAVENOUS
  Filled 2013-09-26 (×23): qty 100

## 2013-09-26 NOTE — Progress Notes (Addendum)
Patient ID: James Mccormick, male   DOB: [birth year], 48 y.o.   MRN: [id number]   Subjective: C/o diarrhea.  Still not getting up much.  I talked to his sister James Mccormick.  Apparently the patient has had depression/PTSD, family hx of bipolar disorder(mother), also he accidentally killed his 4 year old nephew and has had anxiety/depression since.  He has been off zoloft for sometime.  i checked with pharmacy there are not any SSRI/SNRI which are IV/IM/transdermal.    Objective:  Vital signs:  Filed Vitals:   09/25/13 1500 09/25/13 2239 09/26/13 0500 09/26/13 0622  BP: 134/67 140/94  129/86  Pulse: 110 110  104  Temp: 98.6 F (37 C) 98.1 F (36.7 C)  98 F (36.7 C)  TempSrc: Oral Oral  Oral  Resp: '20 18  18  ' Height:      Weight:   206 lb 5.6 oz (93.6 kg)   SpO2: 98% 96%  95%    Last BM Date: 09/26/13  Intake/Output   Yesterday:  02/05 0701 - 02/06 0700 In: 4984.7 [I.V.:497.7; NG/GT:50; IV Piggyback:250; ZDG:6440] Out: 3474 [Urine:2795; Emesis/NG output:250; Drains:58; Stool:2] This shift:  Total I/O In: -  Out: 300 [Urine:300]  Physical Exam:  General: Pt awake/alert/oriented and in no acute distress  Chest: few scattered rhonchi. No chest wall pain w good excursion  CV: Pulses intact. Regular rhythm. tachycardic  MS: Normal AROM mjr joints. No obvious deformity  Bowel sounds are present. Non tender. Bilateral drains with purulent output. Midline dressing is c/d/i. Ext: SCDs BLE. No mjr edema. No cyanosis  Skin: No petechiae / purpura   Problem List:   Principal Problem:   Perforated stomach Active Problems:   Septic shock(785.52)   Severe sepsis(995.92)   ARDS (adult respiratory distress syndrome)   Hypocalcemia   Hypophosphatemia   Protein-calorie malnutrition, moderate   Atrial fibrillation   Pleural effusion   C. difficile colitis    Results:   Labs: Results for orders placed during the hospital encounter of 08/28/13 (from the past 48 hour(s))   GLUCOSE, CAPILLARY     Status: Abnormal   Collection Time    09/24/13  1:12 PM      Result Value Range   Glucose-Capillary 135 (*) 70 - 99 mg/dL  GLUCOSE, CAPILLARY     Status: Abnormal   Collection Time    09/24/13  4:13 PM      Result Value Range   Glucose-Capillary 134 (*) 70 - 99 mg/dL  GLUCOSE, CAPILLARY     Status: Abnormal   Collection Time    09/24/13  8:17 PM      Result Value Range   Glucose-Capillary 140 (*) 70 - 99 mg/dL  GLUCOSE, CAPILLARY     Status: Abnormal   Collection Time    09/25/13 12:34 AM      Result Value Range   Glucose-Capillary 128 (*) 70 - 99 mg/dL  COMPREHENSIVE METABOLIC PANEL     Status: Abnormal   Collection Time    09/25/13  4:20 AM      Result Value Range   Sodium 138  137 - 147 mEq/L   Potassium 4.5  3.7 - 5.3 mEq/L   Chloride 104  96 - 112 mEq/L   CO2 18 (*) 19 - 32 mEq/L   Glucose, Bld 144 (*) 70 - 99 mg/dL   BUN 49 (*) 6 - 23 mg/dL   Creatinine, Ser 1.37 (*) 0.50 - 1.35 mg/dL   Calcium 8.0 (*) 8.4 -  10.5 mg/dL   Total Protein 7.3  6.0 - 8.3 g/dL   Albumin 2.3 (*) 3.5 - 5.2 g/dL   AST 20  0 - 37 U/L   ALT 17  0 - 53 U/L   Alkaline Phosphatase 118 (*) 39 - 117 U/L   Total Bilirubin 0.5  0.3 - 1.2 mg/dL   GFR calc non Af Amer 59 (*) >90 mL/min   GFR calc Af Amer 69 (*) >90 mL/min   Comment: (NOTE)     The eGFR has been calculated using the CKD EPI equation.     This calculation has not been validated in all clinical situations.     eGFR's persistently <90 mL/min signify possible Chronic Kidney     Disease.  MAGNESIUM     Status: None   Collection Time    09/25/13  4:20 AM      Result Value Range   Magnesium 2.2  1.5 - 2.5 mg/dL  PHOSPHORUS     Status: None   Collection Time    09/25/13  4:20 AM      Result Value Range   Phosphorus 4.3  2.3 - 4.6 mg/dL  CBC     Status: Abnormal   Collection Time    09/25/13  4:20 AM      Result Value Range   WBC 13.4 (*) 4.0 - 10.5 K/uL   RBC 3.61 (*) 4.22 - 5.81 MIL/uL   Hemoglobin 10.7  (*) 13.0 - 17.0 g/dL   HCT 32.7 (*) 39.0 - 52.0 %   MCV 90.6  78.0 - 100.0 fL   MCH 29.6  26.0 - 34.0 pg   MCHC 32.7  30.0 - 36.0 g/dL   RDW 14.8  11.5 - 15.5 %   Platelets 283  150 - 400 K/uL  GLUCOSE, CAPILLARY     Status: None   Collection Time    09/25/13 12:38 PM      Result Value Range   Glucose-Capillary 95  70 - 99 mg/dL   Comment 1 Notify RN    GLUCOSE, CAPILLARY     Status: Abnormal   Collection Time    09/25/13  5:41 PM      Result Value Range   Glucose-Capillary 101 (*) 70 - 99 mg/dL  GLUCOSE, CAPILLARY     Status: Abnormal   Collection Time    09/25/13  8:01 PM      Result Value Range   Glucose-Capillary 181 (*) 70 - 99 mg/dL  GLUCOSE, CAPILLARY     Status: Abnormal   Collection Time    09/25/13 11:29 PM      Result Value Range   Glucose-Capillary 177 (*) 70 - 99 mg/dL  BASIC METABOLIC PANEL     Status: Abnormal   Collection Time    09/26/13  6:00 AM      Result Value Range   Sodium 135 (*) 137 - 147 mEq/L   Potassium 4.6  3.7 - 5.3 mEq/L   Chloride 103  96 - 112 mEq/L   CO2 20  19 - 32 mEq/L   Glucose, Bld 130 (*) 70 - 99 mg/dL   BUN 44 (*) 6 - 23 mg/dL   Creatinine, Ser 1.21  0.50 - 1.35 mg/dL   Calcium 8.0 (*) 8.4 - 10.5 mg/dL   GFR calc non Af Amer 69 (*) >90 mL/min   GFR calc Af Amer 80 (*) >90 mL/min   Comment: (NOTE)     The eGFR has been  calculated using the CKD EPI equation.     This calculation has not been validated in all clinical situations.     eGFR's persistently <90 mL/min signify possible Chronic Kidney     Disease.  GLUCOSE, CAPILLARY     Status: Abnormal   Collection Time    09/26/13  6:33 AM      Result Value Range   Glucose-Capillary 100 (*) 70 - 99 mg/dL    Imaging / Studies: Ct Abdomen Pelvis W Contrast  09/25/2013   CLINICAL DATA:  Perforated viscus, post gastric repair  EXAM: CT ABDOMEN AND PELVIS WITH CONTRAST  TECHNIQUE: Multidetector CT imaging of the abdomen and pelvis was performed using the standard protocol following  bolus administration of intravenous contrast. Sagittal and coronal MPR images reconstructed from axial data set.  CONTRAST:  66m OMNIPAQUE IOHEXOL 300 MG/ML SOLN. Dilute oral contrast.  COMPARISON:  09/20/2013  FINDINGS: Persistent partial right lower lobe atelectasis.  Nasogastric tube extends into stomach.  Two percutaneous surgical drains in upper abdomen.  Persistent stranding of upper abdominal fat adjacent to drains with scattered free air, unchanged.  Liver, spleen, pancreas, kidneys, and adrenal glands normal appearance.  Scattered upper abdominal ascites.  Loculated pockets of fluid are seen in the abdomen and pelvis, sterile versus infected.  Left upper quadrant collection 5.5 x 4.0 cm image 36 (previously 5.5 x 4.9 cm).  Collection inferior to stomach 3.2 by 1.9 cm image 46 unchanged.  Smaller left upper quadrant collection 2.3 x 1.7 cm image 43,) previously 2.4 x 1.9 cm).  Triangular collection in right pelvis again identified, 3.3 x 4.4 cm image 82, previously 3.0 x 4.1 cm.  Additional loculated collection in cul de sac containing foci of gas measures 5.3 x 3.7 cm, stable.  Loculated fluid collection superior to the bladder is smaller than on the previous exam, 6.7 x 5.1 cm image 93 previously 12.3 x 5.0 cm.  Open ventral wound upper abdomen.  No bowel wall thickening or evidence of obstruction.  Normal appendix.  Stomach decompressed with scattered wall thickening.  Other than distal colonic diverticulosis, bowel loops unremarkable.  No mass, adenopathy or hernia.  No acute osseous findings.  IMPRESSION: Multiple fluid collections throughout the abdomen and pelvis, ranging from stable to mildly decreased in sizes versus previous study.  No definite new or enlarging collections identified.  Persistent inflammatory changes in left upper quadrant with persistent gastric wall thickening, upper abdominal fat infiltration and foci of gas as well as surgical drains.  No new significant intra-abdominal or  intrapelvic findings.   Electronically Signed   By: MLavonia DanaM.D.   On: 09/25/2013 14:20    Medications / Allergies: per chart  Antibiotics: Anti-infectives   Start     Dose/Rate Route Frequency Ordered Stop   09/24/13 0930  piperacillin-tazobactam (ZOSYN) IVPB 3.375 g     3.375 g 12.5 mL/hr over 240 Minutes Intravenous 3 times per day 09/24/13 0832     09/22/13 1400  metroNIDAZOLE (FLAGYL) IVPB 500 mg     500 mg 100 mL/hr over 60 Minutes Intravenous Every 8 hours 09/22/13 1119     09/20/13 1230  fluconazole (DIFLUCAN) IVPB 200 mg     200 mg 100 mL/hr over 60 Minutes Intravenous Every 24 hours 09/20/13 1154     09/20/13 1200  fluconazole (DIFLUCAN) IVPB 100 mg  Status:  Discontinued     100 mg 50 mL/hr over 60 Minutes Intravenous Every 24 hours 09/20/13 1151 09/20/13 1154  09/10/13 2200  metroNIDAZOLE (FLAGYL) IVPB 500 mg  Status:  Discontinued     500 mg 100 mL/hr over 60 Minutes Intravenous 3 times per day 09/10/13 1802 09/22/13 1118   09/10/13 2000  vancomycin (VANCOCIN) 50 mg/mL oral solution 500 mg  Status:  Discontinued     500 mg Oral 4 times per day 09/10/13 1802 09/21/13 1151   09/10/13 1800  vancomycin (VANCOCIN) 50 mg/mL oral solution 500 mg  Status:  Discontinued     500 mg Oral 4 times per day 09/10/13 1432 09/10/13 1802   09/10/13 1600  metroNIDAZOLE (FLAGYL) IVPB 500 mg  Status:  Discontinued     500 mg 100 mL/hr over 60 Minutes Intravenous 3 times per day 09/10/13 1432 09/10/13 1802   09/09/13 1602  vancomycin (VANCOCIN) 1,250 mg in sodium chloride 0.9 % 250 mL IVPB  Status:  Discontinued     1,250 mg 166.7 mL/hr over 90 Minutes Intravenous Every 24 hours 09/09/13 1057 09/10/13 1432   09/06/13 1600  imipenem-cilastatin (PRIMAXIN) 500 mg in sodium chloride 0.9 % 100 mL IVPB  Status:  Discontinued     500 mg 200 mL/hr over 30 Minutes Intravenous 3 times per day 09/06/13 1310 09/12/13 1048   09/03/13 1100  imipenem-cilastatin (PRIMAXIN) 500 mg in sodium chloride  0.9 % 100 mL IVPB  Status:  Discontinued     500 mg 200 mL/hr over 30 Minutes Intravenous Every 6 hours 09/03/13 1007 09/06/13 1310   09/02/13 1300  vancomycin (VANCOCIN) IVPB 1000 mg/200 mL premix  Status:  Discontinued     1,000 mg 200 mL/hr over 60 Minutes Intravenous Every 12 hours 09/01/13 1527 09/07/13 1340   08/30/13 2000  vancomycin (VANCOCIN) IVPB 750 mg/150 ml premix  Status:  Discontinued     750 mg 150 mL/hr over 60 Minutes Intravenous Every 8 hours 08/30/13 1356 09/01/13 1527   08/29/13 2200  micafungin (MYCAMINE) 100 mg in sodium chloride 0.9 % 100 mL IVPB  Status:  Discontinued     100 mg 100 mL/hr over 1 Hours Intravenous Every 24 hours 08/29/13 2036 09/10/13 1432   08/29/13 0400  vancomycin (VANCOCIN) IVPB 1000 mg/200 mL premix  Status:  Discontinued     1,000 mg 200 mL/hr over 60 Minutes Intravenous Every 8 hours 08/28/13 2358 08/30/13 1356   08/29/13 0400  piperacillin-tazobactam (ZOSYN) IVPB 3.375 g  Status:  Discontinued     3.375 g 12.5 mL/hr over 240 Minutes Intravenous 3 times per day 08/28/13 2358 09/03/13 1002   08/28/13 1845  [MAR Hold]  vancomycin (VANCOCIN) IVPB 1000 mg/200 mL premix     (On MAR Hold since 08/28/13 1957)   1,000 mg 200 mL/hr over 60 Minutes Intravenous  Once 08/28/13 1831 08/28/13 1955   08/28/13 1845  [MAR Hold]  piperacillin-tazobactam (ZOSYN) IVPB 3.375 g     (On MAR Hold since 08/28/13 1957)   3.375 g 100 mL/hr over 30 Minutes Intravenous  Once 08/28/13 1831 08/28/13 2056      Assessment/Plan  Acute respiratory failure-resolved  Hx HTN-stable. Continue with metoprolol IV.  NSTEMI-ischemia demand, stable, cards follow up  Acute systolic heart failure-echo 08/29/13 with diffuse hypokinesis with EF 40-45%, poor study, recommend repeat. Will need cardiology follow up.  Septic shock 2/2 peritonitis-resolved  AKI-resolved Acute encephalopathy-resolved  Perforated gastric ulcer s/p exploratory laparotomy Dr. Redmond Pulling 08/28/13  POD#29  -CT scan  2/5--->multiple fluid collections throughout the abdomen and pelvis, either stable or decreasing.  We will ask IR if any  additional may be drained or aspirated.   -Continue NGT to suction, TPN  -restarted zosyn 2/4---> and diflucan1/31--->. Indication: UGI fistula.  -wound dehisced, continue with BID wet to dry dressing changes  -VTE prophylaxis; SCDs, lovenox  -check CBC PCM  -continue with TPN  Deconditioning-PT/OT, up to chair, ambulate.  Anemia of critical illness-stable  Hypokalemia-resolved  C. Diff colitis  -treatment completed. He is having diarrhea, ?repeat stool for c. Diff and if positive treat with oral vanc. Continue with flagyl while on antibiotics to reduce recurrence  Depression/anxiety -i have asked pharmacy for help find an alternative to [place], [facility] [place] [facility] Pager [phone] Office [phone]  09/26/2013 8:18 AM   ADDENDUM: Dr. Donne Hazel and I spoke to Dr. Anselm Pancoast about the patient's most recent CT scan.  We will attempt to drain the right-sided fluid collection today given the culture is growing some yeast to see if this will help him overall.  I have d/w the patient, his daughter, and his sister who all agree and are very appreciative of all our help. Delbra Zellars E

## 2013-09-26 NOTE — Progress Notes (Signed)
PT Cancellation Note  Patient Details Name: James Mccormick MRN: [id number] DOB: 06/19/1966   Cancelled Treatment:    Reason Eval/Treat Not Completed: Patient at procedure or test/unavailable. Pt going down for new drain, per RN. Will re-attempt to see at next available time.    Donnamarie PoagWest, [place], [place] [phone] 09/26/2013, 10:39 AM

## 2013-09-26 NOTE — ED Notes (Signed)
O2 2l/Russell started 

## 2013-09-26 NOTE — Progress Notes (Signed)
Agree with above, diarrhea from contrast, discussed with ir about drain fluid with yeast over liver today, will ask id to see also for recs,

## 2013-09-26 NOTE — ED Notes (Signed)
Pt in Radiology Nurses Station awaiting procedure.  Pt aware of events.

## 2013-09-26 NOTE — ED Notes (Addendum)
Time out done prior to procedure, documented late.

## 2013-09-26 NOTE — Progress Notes (Signed)
i checked with pharmacy who states the only alternative is emsam transdermal(MAOI).  Im not comfortable managing this and will therefore consult psychiatry to help with medication management since the patient will be NPO for multiple months.  James Norrismina Curley Mccormick, ANP-BC Pager [phone]

## 2013-09-26 NOTE — ED Notes (Signed)
Moved to CT 

## 2013-09-26 NOTE — Progress Notes (Addendum)
NUTRITION FOLLOW UP  Intervention:    TPN per pharmacy RD to follow for nutrition care plan  Nutrition Dx:   Inadequate oral intake now related to inability to eat as evidenced by NPO status, ongoing  Goal:   Pt to meet >/= 90% of their estimated nutrition needs, met  Monitor:   TPN prescription, PO diet advancement, weight, labs, I/O's  Assessment:   Patient with PMH of GERD, HTN presented with severe abdominal pain associated with nausea & vomiting; had been taking Alleve and large amount of Goody's powder.   CT scan revealed perforated stomach ulcer due to NSAID use.   Patient s/p procedures 1/8:  EXPLORATORY LAPAROTOMY  DEBRIDEMENT OF NECROTIC STOMACH  REPAIR OF PERFORATED STOMACH   Patient tested positive for C difficile 1/21. IV Flagyl & PO Vancomycin added.  Patient s/p FEES 1/23.  Demonstrated mild dysphagia.  Advanced to Clear Liquids 1/27, Full Liquids 1/28. Patient diet advanced to dysphagia 3, thin liquids 1/29.  Patient's current diet is NPO as of 1/31. 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  Patient receiving cyclic TPN now with Clinimix E 5/15 + lipids over 12 hours to provide 2400 mls = full support. Provides 2184 kcal, 120 gm protein daily. Meets 99% minimum estimated energy needs and 86% minimum estimated protein needs. Pt appears to be doing well on cyclic TPN; No complaints. Weight trended up this week. RN reports pt will be going for drain placement today. Pt remains NPO. Pt now with stage 2 pressure ulcer on posterior head.   Labs reviewed. Noted last prealbumin and triglycerides WNL. Potassium, magnesium, and phosphorus are WNL. Sodium is low.    Height: 08/28/13  '5\' 11"'  (1.803 m)   Weight Status:   Wt Readings from Last 1 Encounters:  09/26/13 206 lb 5.6 oz (93.6 kg)   02/03  200  lb 01/28  215 lb 01/27  220 lb 01/26   220 lb 01/24   221 lb  01/23   222 lb  01/22   230 lb  01/21   233 lb  01/20   252 lb  01/19   261 lb  01/18   276 lb  01/17   278 lb  01/16   279 lb  01/12   262 lb   Admission weight: 225 lb  Body mass index is 28.79 kg/(m^2).   Re-estimated needs:  Kcal: 2250-2450 Protein: 130-140 gm Fluid: 2.2-2.4 L  Skin: abdominal surgical incision; +1 generalized edema, +1 RLE and LLE edema, +1perineal edema; stage 2 pressure ulcer on posterior head  Diet Order: NPO    Intake/Output Summary (Last 24 hours) at 09/26/13 1236 Last data filed at 09/26/13 0803  Gross per 24 hour  Intake 4864.7 ml  Output   2563 ml  Net 2301.7 ml    Last BM: 2/6  Labs:   Recent Labs Lab 09/23/13 0500 09/24/13 0515 09/25/13 0420 09/26/13 0600  NA 140 139 138 135*  K 4.8 4.4 4.5 4.6  CL 105 105 104 103  CO2 22 21 18* 20  BUN 50* 52* 49* 44*  CREATININE 1.49* 1.47* 1.37* 1.21  CALCIUM 8.2* 8.1* 8.0* 8.0*  MG 2.4 2.1 2.2  --   PHOS 5.1* 3.8 4.3  --   GLUCOSE 127* 134* 144* 130*    CBG (last 3)  Recent Labs  09/25/13 2001 09/25/13 2329 09/26/13 0633  GLUCAP 181* 177* 100*    Scheduled Meds: . antiseptic oral rinse  15 mL Mouth Rinse q12n4p  . chlorhexidine  15 mL Mouth Rinse BID  . [START ON 09/27/2013] enoxaparin (LOVENOX) injection  40 mg Subcutaneous Q24H  . fluconazole (DIFLUCAN) IV  200 mg Intravenous Q24H  . insulin aspart  0-15 Units Subcutaneous Custom  . metoprolol  5 mg Intravenous Q6H  . metronidazole  500 mg Intravenous Q8H  . pantoprazole (PROTONIX) IV  40 mg Intravenous Q12H  . piperacillin-tazobactam (ZOSYN)  IV  3.375 g Intravenous Q8H  . sodium chloride  10-40 mL Intracatheter Q12H    Continuous Infusions: . sodium chloride 20 mL/hr (09/25/13 1850)  . Marland KitchenTPN (CLINIMIX-E) Adult 100 mL/hr at 09/25/13 1736   And  . fat emulsion 240 mL (09/25/13 1736)  . Marland KitchenTPN (CLINIMIX-E) Adult     And  . fat emulsion      Pryor Ochoa RD, LDN Inpatient Clinical Dietitian Pager: [phone] After Hours Pager: [phone]

## 2013-09-26 NOTE — Progress Notes (Signed)
Agree with Ms James InchesOsbornes addendum, he will get drain today, id consult, psych consult. If does well could go to [facility] in next 48-72 hours

## 2013-09-26 NOTE — Progress Notes (Signed)
Pt got up by himself, pulled out PICC line and flexiseal, pulled JP bulbs off but not the drains. Pt. Went to bathroom and called out using the call bell from the bathroom. When entering the room, the nurse found the patient lying in the bathroom floor with a pillow under his head.  No physical injury noted. Patient conscious and answering appropriately. Patient cleaned up and helped to the chair. Patient's nurse was actually at lunch when this happened and the nurse covering had just checked in on him and he said he was fine.  Dr. Lindie SpruceWyatt noted. Orders received. Daughter was notified. She said she was not surprised as he has had similar behavior in the past when he was off of his Zoloft. PICC line reinserted by IV therapy.

## 2013-09-26 NOTE — H&P (Signed)
Chief Complaint: "Abdominal pain." Referring Physician: CCS HPI: James Mccormick is an 48 y.o. 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 IR was consulted previously s/p aspiration of right subphrenic fluid collection 09/21/13. Repeat CT 09/25/13 revealed increasing right lateral abdominal fluid collection. Request for image guided percutaneous drain placement, images reviewed by Dr. Anselm Pancoast. The patient denies any chest pain or shortness of breath, he does have blood production via NGT, he denies any difficulty with previous conscious sedation on 09/21/13. The patient's family is in the room today during our discussion.   Past Medical History:  Past Medical History  Diagnosis Date  . GERD (gastroesophageal reflux disease)   . Hypertension     Past Surgical History:  Past Surgical History  Procedure Laterality Date  . Neck surgery    . Laparotomy N/A 08/28/2013    Procedure: EXPLORATORY LAPAROTOMY,debridment of nacrotic stomach,and primary repair of perforated stomach.;  Surgeon: Gayland Curry, MD;  Location: [facility] OR;  Service: General;  Laterality: N/A;    Family History:  Family History  Problem Relation Age of Onset  . Mental illness Mother   . Hypertension Sister   . Hypertension Brother     Social History:  reports that he has been smoking Cigarettes.  He has been smoking about 1.00 pack per day. He does not have any smokeless tobacco history on file. He reports that he does not drink alcohol or use illicit drugs.  Allergies: No Known Allergies  Medications:   Medication List    ASK your doctor about these medications       DIOVAN 320 MG tablet  Generic drug:  valsartan  Take 320 mg by mouth daily.     LORazepam 0.5 MG tablet  Commonly known as:  ATIVAN  Take 0.5 mg by mouth daily as needed for anxiety.     omeprazole 20 MG  capsule  Commonly known as:  PRILOSEC  Take 20 mg by mouth daily.     sertraline 50 MG tablet  Commonly known as:  ZOLOFT  Take 50 mg by mouth 3 (three) times daily.       Please HPI for pertinent positives, otherwise complete 10 system ROS negative.  Physical Exam: BP 129/86  Pulse 104  Temp(Src) 98 F (36.7 C) (Oral)  Resp 18  Ht _0  (1.803 m)  Wt 206 lb 5.6 oz (93.6 kg)  BMI 28.79 kg/m2  SpO2 95% Body mass index is 28.79 kg/(m^2).  General Appearance:  Alert, cooperative, no distress  Head:  Normocephalic, without obvious abnormality, atraumatic  Neck: Supple, symmetrical, trachea midline  Lungs:   Scattered rhonchi, respirations unlabored without use of accessory muscles.  Chest Wall:  No tenderness or deformity  Heart:  Tachycardic, regular rhythm, S1, S2 normal, no murmur, rub or gallop.  Abdomen:   Bilateral surgical drain intact with white/tan purulent output, (+) BS  Extremities: Extremities normal, atraumatic, no cyanosis or edema  Neurologic: Normal affect, no gross deficits.   Results for orders placed during the hospital encounter of 08/28/13 (from the past 48 hour(s))  GLUCOSE, CAPILLARY     Status: Abnormal   Collection Time    09/24/13  1:12 PM      Result Value Range   Glucose-Capillary 135 (*) 70 - 99 mg/dL  GLUCOSE, CAPILLARY     Status: Abnormal   Collection  Time    09/24/13  4:13 PM      Result Value Range   Glucose-Capillary 134 (*) 70 - 99 mg/dL  GLUCOSE, CAPILLARY     Status: Abnormal   Collection Time    09/24/13  8:17 PM      Result Value Range   Glucose-Capillary 140 (*) 70 - 99 mg/dL  GLUCOSE, CAPILLARY     Status: Abnormal   Collection Time    09/25/13 12:34 AM      Result Value Range   Glucose-Capillary 128 (*) 70 - 99 mg/dL  COMPREHENSIVE METABOLIC PANEL     Status: Abnormal   Collection Time    09/25/13  4:20 AM      Result Value Range   Sodium 138  137 - 147 mEq/L   Potassium 4.5  3.7 - 5.3 mEq/L   Chloride 104  96 - 112  mEq/L   CO2 18 (*) 19 - 32 mEq/L   Glucose, Bld 144 (*) 70 - 99 mg/dL   BUN 49 (*) 6 - 23 mg/dL   Creatinine, Ser 1.37 (*) 0.50 - 1.35 mg/dL   Calcium 8.0 (*) 8.4 - 10.5 mg/dL   Total Protein 7.3  6.0 - 8.3 g/dL   Albumin 2.3 (*) 3.5 - 5.2 g/dL   AST 20  0 - 37 U/L   ALT 17  0 - 53 U/L   Alkaline Phosphatase 118 (*) 39 - 117 U/L   Total Bilirubin 0.5  0.3 - 1.2 mg/dL   GFR calc non Af Amer 59 (*) >90 mL/min   GFR calc Af Amer 69 (*) >90 mL/min   Comment: (NOTE)     The eGFR has been calculated using the CKD EPI equation.     This calculation has not been validated in all clinical situations.     eGFR's persistently <90 mL/min signify possible Chronic Kidney     Disease.  MAGNESIUM     Status: None   Collection Time    09/25/13  4:20 AM      Result Value Range   Magnesium 2.2  1.5 - 2.5 mg/dL  PHOSPHORUS     Status: None   Collection Time    09/25/13  4:20 AM      Result Value Range   Phosphorus 4.3  2.3 - 4.6 mg/dL  CBC     Status: Abnormal   Collection Time    09/25/13  4:20 AM      Result Value Range   WBC 13.4 (*) 4.0 - 10.5 K/uL   RBC 3.61 (*) 4.22 - 5.81 MIL/uL   Hemoglobin 10.7 (*) 13.0 - 17.0 g/dL   HCT 32.7 (*) 39.0 - 52.0 %   MCV 90.6  78.0 - 100.0 fL   MCH 29.6  26.0 - 34.0 pg   MCHC 32.7  30.0 - 36.0 g/dL   RDW 14.8  11.5 - 15.5 %   Platelets 283  150 - 400 K/uL  GLUCOSE, CAPILLARY     Status: None   Collection Time    09/25/13 12:38 PM      Result Value Range   Glucose-Capillary 95  70 - 99 mg/dL   Comment 1 Notify RN    GLUCOSE, CAPILLARY     Status: Abnormal   Collection Time    09/25/13  5:41 PM      Result Value Range   Glucose-Capillary 101 (*) 70 - 99 mg/dL  GLUCOSE, CAPILLARY     Status: Abnormal  Collection Time    09/25/13  8:01 PM      Result Value Range   Glucose-Capillary 181 (*) 70 - 99 mg/dL  GLUCOSE, CAPILLARY     Status: Abnormal   Collection Time    09/25/13 11:29 PM      Result Value Range   Glucose-Capillary 177 (*) 70 - 99  mg/dL  BASIC METABOLIC PANEL     Status: Abnormal   Collection Time    09/26/13  6:00 AM      Result Value Range   Sodium 135 (*) 137 - 147 mEq/L   Potassium 4.6  3.7 - 5.3 mEq/L   Chloride 103  96 - 112 mEq/L   CO2 20  19 - 32 mEq/L   Glucose, Bld 130 (*) 70 - 99 mg/dL   BUN 44 (*) 6 - 23 mg/dL   Creatinine, Ser 1.21  0.50 - 1.35 mg/dL   Calcium 8.0 (*) 8.4 - 10.5 mg/dL   GFR calc non Af Amer 69 (*) >90 mL/min   GFR calc Af Amer 80 (*) >90 mL/min   Comment: (NOTE)     The eGFR has been calculated using the CKD EPI equation.     This calculation has not been validated in all clinical situations.     eGFR's persistently <90 mL/min signify possible Chronic Kidney     Disease.  GLUCOSE, CAPILLARY     Status: Abnormal   Collection Time    09/26/13  6:33 AM      Result Value Range   Glucose-Capillary 100 (*) 70 - 99 mg/dL  CBC     Status: Abnormal   Collection Time    09/26/13  8:30 AM      Result Value Range   WBC 14.6 (*) 4.0 - 10.5 K/uL   RBC 3.84 (*) 4.22 - 5.81 MIL/uL   Hemoglobin 11.5 (*) 13.0 - 17.0 g/dL   HCT 34.5 (*) 39.0 - 52.0 %   MCV 89.8  78.0 - 100.0 fL   MCH 29.9  26.0 - 34.0 pg   MCHC 33.3  30.0 - 36.0 g/dL   RDW 14.4  11.5 - 15.5 %   Platelets 269  150 - 400 K/uL   Ct Abdomen Pelvis W Contrast  09/25/2013   CLINICAL DATA:  Perforated viscus, post gastric repair  EXAM: CT ABDOMEN AND PELVIS WITH CONTRAST  TECHNIQUE: Multidetector CT imaging of the abdomen and pelvis was performed using the standard protocol following bolus administration of intravenous contrast. Sagittal and coronal MPR images reconstructed from axial data set.  CONTRAST:  56m OMNIPAQUE IOHEXOL 300 MG/ML SOLN. Dilute oral contrast.  COMPARISON:  09/20/2013  FINDINGS: Persistent partial right lower lobe atelectasis.  Nasogastric tube extends into stomach.  Two percutaneous surgical drains in upper abdomen.  Persistent stranding of upper abdominal fat adjacent to drains with scattered free air,  unchanged.  Liver, spleen, pancreas, kidneys, and adrenal glands normal appearance.  Scattered upper abdominal ascites.  Loculated pockets of fluid are seen in the abdomen and pelvis, sterile versus infected.  Left upper quadrant collection 5.5 x 4.0 cm image 36 (previously 5.5 x 4.9 cm).  Collection inferior to stomach 3.2 by 1.9 cm image 46 unchanged.  Smaller left upper quadrant collection 2.3 x 1.7 cm image 43,) previously 2.4 x 1.9 cm).  Triangular collection in right pelvis again identified, 3.3 x 4.4 cm image 82, previously 3.0 x 4.1 cm.  Additional loculated collection in cul de sac containing foci of gas  measures 5.3 x 3.7 cm, stable.  Loculated fluid collection superior to the bladder is smaller than on the previous exam, 6.7 x 5.1 cm image 93 previously 12.3 x 5.0 cm.  Open ventral wound upper abdomen.  No bowel wall thickening or evidence of obstruction.  Normal appendix.  Stomach decompressed with scattered wall thickening.  Other than distal colonic diverticulosis, bowel loops unremarkable.  No mass, adenopathy or hernia.  No acute osseous findings.  IMPRESSION: Multiple fluid collections throughout the abdomen and pelvis, ranging from stable to mildly decreased in sizes versus previous study.  No definite new or enlarging collections identified.  Persistent inflammatory changes in left upper quadrant with persistent gastric wall thickening, upper abdominal fat infiltration and foci of gas as well as surgical drains.  No new significant intra-abdominal or intrapelvic findings.   Electronically Signed   By: Lavonia Dana M.D.   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  Request for image guided percutaneous drain placement, images reviewed by Dr. Richardson Landry reviewed, NPO, lovenox  held.  Risks and Benefits discussed with the patient and his family.  All of the patient's questions were answered, patient is agreeable to proceed.  Consent signed and in chart.   Tsosie Billing D PA-C 09/26/2013, 12:15 PM

## 2013-09-26 NOTE — Progress Notes (Signed)
PARENTERAL NUTRITION CONSULT NOTE - FOLLOW UP  Pharmacy Consult for TPN Indication: Prolonged ileus  No Known Allergies  Patient Measurements: Height: '5\' 11"'  (180.3 cm) Weight: 206 lb 5.6 oz (93.6 kg) IBW/kg (Calculated) : 75.3 Vital Signs: Temp: 98 F (36.7 C) (02/06 0622) Temp src: Oral (02/06 0622) BP: 129/86 mmHg (02/06 0622) Pulse Rate: 104 (02/06 0622) Intake/Output from previous day: 02/05 0701 - 02/06 0700 In: 4984.7 [I.V.:497.7; NG/GT:50; IV Piggyback:250; OIN:8676] Out: 3105 [Urine:2795; Emesis/NG output:250; Drains:58; Stool:2] Intake/Output from this shift:    Labs:  Recent Labs  09/24/13 0515 09/25/13 0420  WBC 14.5* 13.4*  HGB 11.1* 10.7*  HCT 33.6* 32.7*  PLT 293 283    Recent Labs  09/24/13 0515 09/25/13 0420 09/26/13 0600  NA 139 138 135*  K 4.4 4.5 4.6  CL 105 104 103  CO2 21 18* 20  GLUCOSE 134* 144* 130*  BUN 52* 49* 44*  CREATININE 1.47* 1.37* 1.21  CALCIUM 8.1* 8.0* 8.0*  MG 2.1 2.2  --   PHOS 3.8 4.3  --   PROT  --  7.3  --   ALBUMIN  --  2.3*  --   AST  --  20  --   ALT  --  17  --   ALKPHOS  --  118*  --   BILITOT  --  0.5  --    Estimated Creatinine Clearance: 86.3 ml/min (by C-G formula based on Cr of 1.21).    Recent Labs  09/25/13 2001 09/25/13 2329 09/26/13 0633  GLUCAP 181* 177* 100*   Insulin Requirements in the past 24 hours:  24 units insulin in TPN + 6 units from SSI during 12 hr cycle  Current Nutrition:  Clinimix E 5/15 2400 ml over 12 hours  + lipid emulsion 20% at 240 ml over 12 hr NPO  Nutritional Goals:  Goal 2250-2450 kcal and 130-140 gm protein per RD 09/23/13  Assessment:  48 y.o. male presented to ED 1/8 with abd pain. CT consistent with perforated stomach- ulcer due to NSAID use, pneumoperitoneum, peritonitis. Taken emergently to OR where he underwent exp lap, debridement of necrotic stomach, repair of perforated stomach. Contrast study showed no leak from stomach or small bowel.  CT scan 1/31  poss persistent gastric perf, fluid collection 4.5x4.9, and 3rd collection between bladder and rectum. 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 Will require NPO/NG/TNA for a long time. TPN transitioned to cyclic over 12 hour rate.   GI: Back to NPO status 1/31. 2/1. Large stomach wall defect and fistula. CT scan 1/31 - likely persistent gastric perforation, fluid collections that may be abscesses. 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 Will require NPO/NG/TNA for a long time. TPN transitioned ty cyclic.   Nutrition: prealbumin up to 19.3 from 10.7, moving in the right direction  Endo: No h/o DM. CBGs 181 and 177 during 12 hour cycle with 24 units of insulin in 2400 ml bag.   Lytes: K, Mg and phos all within normal limits  Renal: Scr 1.21-  Good UOP.  ID:  Vanc 1/8>>1/21 Zosyn 1/8>>1/14 Micafungin 1/9>>1/21 Primaxin 1/14>>1/23  Vanc PO 1/21>>1/31 Flagyl 1/21>> Zosyn 2/4>> Fluconazole 1/31>>  Cultures: 2/1 peritoneal cavity>>candida species 1/30 urine>> NG 1/21 CDiff Positive 1/16 Trach aspirate no growth 1/9 Blood cultures x 2>> no growth 2/5 CT abd:  Still with multiple fluid collections throughout abd and pelvis; no new findings  Hepatobil: LFTs wnl except alk phos of 118. Trig down to 117.  TPN Access: triple lumen PICC 1/20; replaced 1/27  TPN day#: 28  Plan:  1. Continue Clinimix E 5/15 (with electrolytes) + lipids 2. Continue cyclic rate over 12 hours to provide 2400 mls = full support.    3. Continue multivitamin and full trace elements in TPN 4. increase regular insulin in TPN to 30 units, customize times for CBGs to monitor cyclic rate 5. Continue Zosyn at current dose.  Eudelia Bunch, Pharm.D. [phone] 09/26/2013 7:50 AM

## 2013-09-26 NOTE — Procedures (Signed)
CT guided drain placement in right lateral abdominal fluid collection.  30 ml of yellow thick fluid was removed.  No immediate complication.

## 2013-09-26 NOTE — Consult Note (Signed)
[facility]    Date of Admission:  08/28/2013     piperacillin tazobactam 1/8-13; Day 3 currently        Day 17 metronidazole        Day 7 fluconazole        IV vancomycin 1/8-30        Imipenem 1/14-22        Micafungin 1/9-20        Reason for Consult: Multiloculated intra-abdominal abscesses following gastric perforation and C. difficile colitis    Referring Physician: Dr. Emelia Loron    Principal Problem:   Intra-abdominal abscess Active Problems:   Perforated gastric ulcer   ARDS (adult respiratory distress syndrome)   Hypocalcemia   Hypophosphatemia   Protein-calorie malnutrition, moderate   Atrial fibrillation   Pleural effusion   C. difficile colitis   Depression   Cigarette smoker   . antiseptic oral rinse  15 mL Mouth Rinse q12n4p  . chlorhexidine  15 mL Mouth Rinse BID  . [START ON 09/27/2013] enoxaparin (LOVENOX) injection  40 mg Subcutaneous Q24H  . fentaNYL      . fluconazole (DIFLUCAN) IV  200 mg Intravenous Q24H  . insulin aspart  0-15 Units Subcutaneous Custom  . lidocaine      . metoprolol  5 mg Intravenous Q6H  . metronidazole  500 mg Intravenous Q8H  . midazolam      . pantoprazole (PROTONIX) IV  40 mg Intravenous Q12H  . piperacillin-tazobactam (ZOSYN)  IV  3.375 g Intravenous Q8H  . sodium chloride  10-40 mL Intracatheter Q12H    Recommendations: 1. Change fluconazole to micafungin 2. Laboratory will speciate the Candida species isolated from abscess fluid on 2/1 3. Change metronidazole to oral vancomycin 4. Continue piperacillin tazobactam for now 5. I have ordered stat Gram stain and routine culture of fluid To be aspirated from right upper quadrant collection later today  Assessment: James Mccormick has developed severe peritonitis and intra-abdominal abscesses following gastric perforation. Although his most recent abscess aspirate only grew Candida it is likely to be polymicrobic. Given his significant  antimicrobial exposure over the last month he is at high risk for drug-resistant pathogens. I've asked the lab to speciate Candida so we will have a better idea of what the most effective antifungal agent may be and will change to micafungin now. Given that he will need broad systemic antibiotic therapy that could exacerbate C. difficile colitis I will change him to oral vancomycin. I will have Dr. Ninetta Lights [phone]) follow up this weekend.   HPI: James Mccormick is a 48 y.o. male who was admitted on January 8 with severe abdominal pain. He does have a pneumoperitoneum from a perforated gastric ulcer. The time of surgery an obvious, gross contamination of the peritoneal cavity. He was treated with broad empiric antimicrobial therapy. Recent CT scans revealed multiloculated fluid collection. An aspirate of the right upper quadrant fluid collection on February 1 did not reveal any organisms but cultures grew Candida species. He also has concurrent C. difficile colitis.   Review of Systems: Review of systems not obtained due to patient factors.  Past Medical History  Diagnosis Date  . GERD (gastroesophageal reflux disease)   . Hypertension     History  Substance Use Topics  . Smoking status: Current Every Day Smoker -- 1.00 packs/day    Types: Cigarettes  . Smokeless tobacco: Not on file  . Alcohol Use: No  Family History  Problem Relation Age of Onset  . Mental illness Mother   . Hypertension Sister   . Hypertension Brother    No Known Allergies  OBJECTIVE: Blood pressure 129/86, pulse 104, temperature 98 F (36.7 C), temperature source Oral, resp. rate 18, height 5\' 11"  (1.803 m), weight 93.6 kg (206 lb 5.6 oz), SpO2 95.00%. General: He is currently out of his room for a drain placement by interventional radiologist   Lab Results Lab Results  Component Value Date   WBC 14.6* 09/26/2013   HGB 11.5* 09/26/2013   HCT 34.5* 09/26/2013   MCV 89.8 09/26/2013   PLT 269 09/26/2013      Lab Results  Component Value Date   CREATININE 1.21 09/26/2013   BUN 44* 09/26/2013   NA 135* 09/26/2013   K 4.6 09/26/2013   CL 103 09/26/2013   CO2 20 09/26/2013    Lab Results  Component Value Date   ALT 17 09/25/2013   AST 20 09/25/2013   ALKPHOS 118* 09/25/2013   BILITOT 0.5 09/25/2013     Microbiology: Recent Results (from the past 240 hour(s))  URINE CULTURE     Status: None   Collection Time    09/19/13  9:50 AM      Result Value Range Status   Specimen Description URINE, CLEAN CATCH   Final   Special Requests Normal   Final   Culture  Setup Time     Final   Value: 09/19/2013 10:37     Performed at [facility]   Colony Count     Final   Value: NO GROWTH     [facility]formed at [facility]   Culture     Final   Value: NO GROWTH     Performed at [facility]   Report Status 09/20/2013 FINAL   Final  CULTURE, ROUTINE-ABSCESS     Status: None   Collection Time    09/21/13 11:40 AM      Result Value Range Status   Specimen Description PERITONEAL CAVITY   Final   Special Requests Normal   Final   Gram Stain     Final   Value: ABUNDANT WBC PRESENT, PREDOMINANTLY PMN     NO SQUAMOUS EPITHELIAL CELLS SEEN     NO ORGANISMS SEEN     Performed at [facility]   Culture     Final   Value: FEW YEAST CONSISTENT WITH CANDIDA SPECIES     Performed at [facility]   Report Status 09/24/2013 FINAL   Final    Cliffton AstersJohn Cindi Ghazarian, MD [facility] [phone] pager   [phone] cell 09/26/2013, 2:57 PM

## 2013-09-27 DIAGNOSIS — B3789 Other sites of candidiasis: Secondary | ICD-10-CM

## 2013-09-27 LAB — GLUCOSE, CAPILLARY
GLUCOSE-CAPILLARY: 105 mg/dL — AB (ref 70–99)
GLUCOSE-CAPILLARY: 138 mg/dL — AB (ref 70–99)
GLUCOSE-CAPILLARY: 178 mg/dL — AB (ref 70–99)
Glucose-Capillary: 149 mg/dL — ABNORMAL HIGH (ref 70–99)
Glucose-Capillary: 160 mg/dL — ABNORMAL HIGH (ref 70–99)
Glucose-Capillary: 96 mg/dL (ref 70–99)

## 2013-09-27 LAB — CBC
HCT: 32.2 % — ABNORMAL LOW (ref 39.0–52.0)
HEMOGLOBIN: 10.6 g/dL — AB (ref 13.0–17.0)
MCH: 29.7 pg (ref 26.0–34.0)
MCHC: 32.9 g/dL (ref 30.0–36.0)
MCV: 90.2 fL (ref 78.0–100.0)
Platelets: 275 10*3/uL (ref 150–400)
RBC: 3.57 MIL/uL — ABNORMAL LOW (ref 4.22–5.81)
RDW: 14.5 % (ref 11.5–15.5)
WBC: 11.1 10*3/uL — ABNORMAL HIGH (ref 4.0–10.5)

## 2013-09-27 MED ORDER — FAT EMULSION 20 % IV EMUL
240.0000 mL | INTRAVENOUS | Status: AC
Start: 2013-09-27 — End: 2013-09-28
  Administered 2013-09-27: 240 mL via INTRAVENOUS
  Filled 2013-09-27 (×2): qty 250

## 2013-09-27 MED ORDER — M.V.I. ADULT IV INJ
INTRAVENOUS | Status: AC
  Administered 2013-09-27: 18:00:00 via INTRAVENOUS
  Filled 2013-09-27: qty 2400

## 2013-09-27 MED ORDER — INSULIN ASPART 100 UNIT/ML ~~LOC~~ SOLN
0.0000 [IU] | SUBCUTANEOUS | Status: DC
  Administered 2013-09-28 – 2013-09-29 (×3): 2 [IU] via SUBCUTANEOUS
  Administered 2013-09-29: 3 [IU] via SUBCUTANEOUS

## 2013-09-27 NOTE — Progress Notes (Signed)
30 Days Post-Op  Subjective: No complaints  Objective: Vital signs in last 24 hours: Temp:  [98 F (36.7 C)-99.3 F (37.4 C)] 98 F (36.7 C) (02/07 0514) Pulse Rate:  [100-122] 112 (02/07 0600) Resp:  [16-21] 17 (02/07 0514) BP: (115-150)/(78-91) 150/84 mmHg (02/07 0514) SpO2:  [98 %-100 %] 99 % (02/07 0514) Weight:  [208 lb 1.8 oz (94.4 kg)] 208 lb 1.8 oz (94.4 kg) (02/07 0500) Last BM Date: 09/25/13  Intake/Output from previous day: 02/06 0701 - 02/07 0700 In: 4034 [I.V.:482; NG/GT:100; IV Piggyback:550; TPN:2892] Out: 3940 [Urine:3450; Emesis/NG output:450; Drains:40] Intake/Output this shift:    Resp: clear to auscultation bilaterally Cardio: regular rate and rhythm GI: drains in place. wound clean but dehisced-stable  Lab Results:   Recent Labs  09/26/13 0830 09/27/13 0453  WBC 14.6* 11.1*  HGB 11.5* 10.6*  HCT 34.5* 32.2*  PLT 269 275   BMET  Recent Labs  09/25/13 0420 09/26/13 0600  NA 138 135*  K 4.5 4.6  CL 104 103  CO2 18* 20  GLUCOSE 144* 130*  BUN 49* 44*  CREATININE 1.37* 1.21  CALCIUM 8.0* 8.0*   PT/INR No results found for this basename: LABPROT, INR,  in the last 72 hours ABG No results found for this basename: PHART, PCO2, PO2, HCO3,  in the last 72 hours  Studies/Results: Ct Abdomen Pelvis W Contrast  09/25/2013   CLINICAL DATA:  Perforated viscus, post gastric repair  EXAM: CT ABDOMEN AND PELVIS WITH CONTRAST  TECHNIQUE: Multidetector CT imaging of the abdomen and pelvis was performed using the standard protocol following bolus administration of intravenous contrast. Sagittal and coronal MPR images reconstructed from axial data set.  CONTRAST:  80mL OMNIPAQUE IOHEXOL 300 MG/ML SOLN. Dilute oral contrast.  COMPARISON:  09/20/2013  FINDINGS: Persistent partial right lower lobe atelectasis.  Nasogastric tube extends into stomach.  Two percutaneous surgical drains in upper abdomen.  Persistent stranding of upper abdominal fat adjacent to  drains with scattered free air, unchanged.  Liver, spleen, pancreas, kidneys, and adrenal glands normal appearance.  Scattered upper abdominal ascites.  Loculated pockets of fluid are seen in the abdomen and pelvis, sterile versus infected.  Left upper quadrant collection 5.5 x 4.0 cm image 36 (previously 5.5 x 4.9 cm).  Collection inferior to stomach 3.2 by 1.9 cm image 46 unchanged.  Smaller left upper quadrant collection 2.3 x 1.7 cm image 43,) previously 2.4 x 1.9 cm).  Triangular collection in right pelvis again identified, 3.3 x 4.4 cm image 82, previously 3.0 x 4.1 cm.  Additional loculated collection in cul de sac containing foci of gas measures 5.3 x 3.7 cm, stable.  Loculated fluid collection superior to the bladder is smaller than on the previous exam, 6.7 x 5.1 cm image 93 previously 12.3 x 5.0 cm.  Open ventral wound upper abdomen.  No bowel wall thickening or evidence of obstruction.  Normal appendix.  Stomach decompressed with scattered wall thickening.  Other than distal colonic diverticulosis, bowel loops unremarkable.  No mass, adenopathy or hernia.  No acute osseous findings.  IMPRESSION: Multiple fluid collections throughout the abdomen and pelvis, ranging from stable to mildly decreased in sizes versus previous study.  No definite new or enlarging collections identified.  Persistent inflammatory changes in left upper quadrant with persistent gastric wall thickening, upper abdominal fat infiltration and foci of gas as well as surgical drains.  No new significant intra-abdominal or intrapelvic findings.   Electronically Signed   By: Angelyn Punt.D.  On: 09/25/2013 14:20   Ct Image Guided Drainage By Percutaneous Catheter  09/26/2013   CLINICAL DATA:  History of gastric perforation and intra-abdominal fluid collections.  EXAM: CT GUIDED DRAINAGE OF RIGHT LATERAL ABDOMINAL FLUID COLLECTION  ANESTHESIA/SEDATION: 1.0 mg IV Versed 50 mcg IV Fentanyl. A radiology nurse monitored the patient for  moderate sedation.  Total Moderate Sedation Time:  9 minutes  PROCEDURE: Informed consent was obtained for a CT-guided drainage. Patient was placed supine on the CT scanner. Images through the upper abdomen were obtained. The right side of the abdomen was prepped and draped in sterile fashion. The skin was anesthetized with 1% lidocaine. An 18 gauge needle was directed into the fluid collection and thick yellow fluid was aspirated. A stiff Amplatz wire was placed. The tract was dilated and a 10 [place] multipurpose drain was a advanced into the collection. Approximately 30 mL of thick yellow fluid was removed. The catheter was sutured to the skin and attached to a suction bulb. A sterile dressing was placed over the catheter site.  COMPLICATIONS: None  FINDINGS: Fluid collection along the right lateral abdomen and perihepatic space. Again noted is diffuse thickening in the anterior abdomen and omentum with scattered pockets of gas. Again noted is a fluid collection in the left upper quadrant.  IMPRESSION: CT-guided drainage of the right lateral abdominal fluid collection.   Electronically Signed   By: Richarda OverlieAdam  Henn M.D.   On: 09/26/2013 18:09    Anti-infectives: Anti-infectives   Start     Dose/Rate Route Frequency Ordered Stop   09/26/13 1800  vancomycin (VANCOCIN) 50 mg/mL oral solution 250 mg  Status:  Discontinued     250 mg Oral 4 times per day 09/26/13 1510 09/26/13 1554   09/26/13 1600  metroNIDAZOLE (FLAGYL) IVPB 500 mg     500 mg 100 mL/hr over 60 Minutes Intravenous Every 6 hours 09/26/13 1556     09/26/13 1515  micafungin (MYCAMINE) 100 mg in sodium chloride 0.9 % 100 mL IVPB     100 mg 100 mL/hr over 1 Hours Intravenous Daily 09/26/13 1510     09/24/13 0930  piperacillin-tazobactam (ZOSYN) IVPB 3.375 g     3.375 g 12.5 mL/hr over 240 Minutes Intravenous 3 times per day 09/24/13 0832     09/22/13 1400  metroNIDAZOLE (FLAGYL) IVPB 500 mg  Status:  Discontinued     500 mg 100 mL/hr over 60  Minutes Intravenous Every 8 hours 09/22/13 1119 09/26/13 1510   09/20/13 1230  fluconazole (DIFLUCAN) IVPB 200 mg  Status:  Discontinued     200 mg 100 mL/hr over 60 Minutes Intravenous Every 24 hours 09/20/13 1154 09/26/13 1510   09/20/13 1200  fluconazole (DIFLUCAN) IVPB 100 mg  Status:  Discontinued     100 mg 50 mL/hr over 60 Minutes Intravenous Every 24 hours 09/20/13 1151 09/20/13 1154   09/10/13 2200  metroNIDAZOLE (FLAGYL) IVPB 500 mg  Status:  Discontinued     500 mg 100 mL/hr over 60 Minutes Intravenous 3 times per day 09/10/13 1802 09/22/13 1118   09/10/13 2000  vancomycin (VANCOCIN) 50 mg/mL oral solution 500 mg  Status:  Discontinued     500 mg Oral 4 times per day 09/10/13 1802 09/21/13 1151   09/10/13 1800  vancomycin (VANCOCIN) 50 mg/mL oral solution 500 mg  Status:  Discontinued     500 mg Oral 4 times per day 09/10/13 1432 09/10/13 1802   09/10/13 1600  metroNIDAZOLE (FLAGYL) IVPB 500 mg  Status:  Discontinued     500 mg 100 mL/hr over 60 Minutes Intravenous 3 times per day 09/10/13 1432 09/10/13 1802   09/09/13 1602  vancomycin (VANCOCIN) 1,250 mg in sodium chloride 0.9 % 250 mL IVPB  Status:  Discontinued     1,250 mg 166.7 mL/hr over 90 Minutes Intravenous Every 24 hours 09/09/13 1057 09/10/13 1432   09/06/13 1600  imipenem-cilastatin (PRIMAXIN) 500 mg in sodium chloride 0.9 % 100 mL IVPB  Status:  Discontinued     500 mg 200 mL/hr over 30 Minutes Intravenous 3 times per day 09/06/13 1310 09/12/13 1048   09/03/13 1100  imipenem-cilastatin (PRIMAXIN) 500 mg in sodium chloride 0.9 % 100 mL IVPB  Status:  Discontinued     500 mg 200 mL/hr over 30 Minutes Intravenous Every 6 hours 09/03/13 1007 09/06/13 1310   09/02/13 1300  vancomycin (VANCOCIN) IVPB 1000 mg/200 mL premix  Status:  Discontinued     1,000 mg 200 mL/hr over 60 Minutes Intravenous Every 12 hours 09/01/13 1527 09/07/13 1340   08/30/13 2000  vancomycin (VANCOCIN) IVPB 750 mg/150 ml premix  Status:   Discontinued     750 mg 150 mL/hr over 60 Minutes Intravenous Every 8 hours 08/30/13 1356 09/01/13 1527   08/29/13 2200  micafungin (MYCAMINE) 100 mg in sodium chloride 0.9 % 100 mL IVPB  Status:  Discontinued     100 mg 100 mL/hr over 1 Hours Intravenous Every 24 hours 08/29/13 2036 09/10/13 1432   08/29/13 0400  vancomycin (VANCOCIN) IVPB 1000 mg/200 mL premix  Status:  Discontinued     1,000 mg 200 mL/hr over 60 Minutes Intravenous Every 8 hours 08/28/13 2358 08/30/13 1356   08/29/13 0400  piperacillin-tazobactam (ZOSYN) IVPB 3.375 g  Status:  Discontinued     3.375 g 12.5 mL/hr over 240 Minutes Intravenous 3 times per day 08/28/13 2358 09/03/13 1002   08/28/13 1845  [MAR Hold]  vancomycin (VANCOCIN) IVPB 1000 mg/200 mL premix     (On MAR Hold since 08/28/13 1957)   1,000 mg 200 mL/hr over 60 Minutes Intravenous  Once 08/28/13 1831 08/28/13 1955   08/28/13 1845  [MAR Hold]  piperacillin-tazobactam (ZOSYN) IVPB 3.375 g     (On MAR Hold since 08/28/13 1957)   3.375 g 100 mL/hr over 30 Minutes Intravenous  Once 08/28/13 1831 08/28/13 2056      Assessment/Plan: s/p Procedure(s): EXPLORATORY LAPAROTOMY,debridment of nacrotic stomach,and primary repair of perforated stomach. (N/A) Protein calorie malnutrition: continue tpn PT/OT C. Diff: continue IV flagyl Perforated stomach: continue abx and drains NSTEMI: Cards following HTN: continue IV lopressor  LOS: 30 days    TOTH III,PAUL S 09/27/2013

## 2013-09-27 NOTE — Progress Notes (Signed)
INFECTIOUS DISEASE PROGRESS NOTE  ID: James Mccormick is a 48 y.o. male with  Principal Problem:   Intra-abdominal abscess Active Problems:   Perforated gastric ulcer   ARDS (adult respiratory distress syndrome)   Hypocalcemia   Hypophosphatemia   Protein-calorie malnutrition, moderate   Atrial fibrillation   Pleural effusion   C. difficile colitis   Depression   Cigarette smoker  Subjective: No BM since 2-4 Without complaints  Abtx:  Anti-infectives   Start     Dose/Rate Route Frequency Ordered Stop   09/26/13 1800  vancomycin (VANCOCIN) 50 mg/mL oral solution 250 mg  Status:  Discontinued     250 mg Oral 4 times per day 09/26/13 1510 09/26/13 1554   09/26/13 1600  metroNIDAZOLE (FLAGYL) IVPB 500 mg     500 mg 100 mL/hr over 60 Minutes Intravenous Every 6 hours 09/26/13 1556     09/26/13 1515  micafungin (MYCAMINE) 100 mg in sodium chloride 0.9 % 100 mL IVPB     100 mg 100 mL/hr over 1 Hours Intravenous Daily 09/26/13 1510     09/24/13 0930  piperacillin-tazobactam (ZOSYN) IVPB 3.375 g     3.375 g 12.5 mL/hr over 240 Minutes Intravenous 3 times per day 09/24/13 0832     09/22/13 1400  metroNIDAZOLE (FLAGYL) IVPB 500 mg  Status:  Discontinued     500 mg 100 mL/hr over 60 Minutes Intravenous Every 8 hours 09/22/13 1119 09/26/13 1510   09/20/13 1230  fluconazole (DIFLUCAN) IVPB 200 mg  Status:  Discontinued     200 mg 100 mL/hr over 60 Minutes Intravenous Every 24 hours 09/20/13 1154 09/26/13 1510   09/20/13 1200  fluconazole (DIFLUCAN) IVPB 100 mg  Status:  Discontinued     100 mg 50 mL/hr over 60 Minutes Intravenous Every 24 hours 09/20/13 1151 09/20/13 1154   09/10/13 2200  metroNIDAZOLE (FLAGYL) IVPB 500 mg  Status:  Discontinued     500 mg 100 mL/hr over 60 Minutes Intravenous 3 times per day 09/10/13 1802 09/22/13 1118   09/10/13 2000  vancomycin (VANCOCIN) 50 mg/mL oral solution 500 mg  Status:  Discontinued     500 mg Oral 4 times per day 09/10/13 1802  09/21/13 1151   09/10/13 1800  vancomycin (VANCOCIN) 50 mg/mL oral solution 500 mg  Status:  Discontinued     500 mg Oral 4 times per day 09/10/13 1432 09/10/13 1802   09/10/13 1600  metroNIDAZOLE (FLAGYL) IVPB 500 mg  Status:  Discontinued     500 mg 100 mL/hr over 60 Minutes Intravenous 3 times per day 09/10/13 1432 09/10/13 1802   09/09/13 1602  vancomycin (VANCOCIN) 1,250 mg in sodium chloride 0.9 % 250 mL IVPB  Status:  Discontinued     1,250 mg 166.7 mL/hr over 90 Minutes Intravenous Every 24 hours 09/09/13 1057 09/10/13 1432   09/06/13 1600  imipenem-cilastatin (PRIMAXIN) 500 mg in sodium chloride 0.9 % 100 mL IVPB  Status:  Discontinued     500 mg 200 mL/hr over 30 Minutes Intravenous 3 times per day 09/06/13 1310 09/12/13 1048   09/03/13 1100  imipenem-cilastatin (PRIMAXIN) 500 mg in sodium chloride 0.9 % 100 mL IVPB  Status:  Discontinued     500 mg 200 mL/hr over 30 Minutes Intravenous Every 6 hours 09/03/13 1007 09/06/13 1310   09/02/13 1300  vancomycin (VANCOCIN) IVPB 1000 mg/200 mL premix  Status:  Discontinued     1,000 mg 200 mL/hr over 60 Minutes Intravenous Every 12  hours 09/01/13 1527 09/07/13 1340   08/30/13 2000  vancomycin (VANCOCIN) IVPB 750 mg/150 ml premix  Status:  Discontinued     750 mg 150 mL/hr over 60 Minutes Intravenous Every 8 hours 08/30/13 1356 09/01/13 1527   08/29/13 2200  micafungin (MYCAMINE) 100 mg in sodium chloride 0.9 % 100 mL IVPB  Status:  Discontinued     100 mg 100 mL/hr over 1 Hours Intravenous Every 24 hours 08/29/13 2036 09/10/13 1432   08/29/13 0400  vancomycin (VANCOCIN) IVPB 1000 mg/200 mL premix  Status:  Discontinued     1,000 mg 200 mL/hr over 60 Minutes Intravenous Every 8 hours 08/28/13 2358 08/30/13 1356   08/29/13 0400  piperacillin-tazobactam (ZOSYN) IVPB 3.375 g  Status:  Discontinued     3.375 g 12.5 mL/hr over 240 Minutes Intravenous 3 times per day 08/28/13 2358 09/03/13 1002   08/28/13 1845  [MAR Hold]  vancomycin  (VANCOCIN) IVPB 1000 mg/200 mL premix     (On MAR Hold since 08/28/13 1957)   1,000 mg 200 mL/hr over 60 Minutes Intravenous  Once 08/28/13 1831 08/28/13 1955   08/28/13 1845  [MAR Hold]  piperacillin-tazobactam (ZOSYN) IVPB 3.375 g     (On MAR Hold since 08/28/13 1957)   3.375 g 100 mL/hr over 30 Minutes Intravenous  Once 08/28/13 1831 08/28/13 2056      Medications:  Scheduled: . antiseptic oral rinse  15 mL Mouth Rinse q12n4p  . chlorhexidine  15 mL Mouth Rinse BID  . enoxaparin (LOVENOX) injection  40 mg Subcutaneous Q24H  . insulin aspart  0-15 Units Subcutaneous Custom  . metoprolol  5 mg Intravenous Q6H  . metronidazole  500 mg Intravenous Q6H  . micafungin (MYCAMINE) IV  100 mg Intravenous Daily  . pantoprazole (PROTONIX) IV  40 mg Intravenous Q12H  . piperacillin-tazobactam (ZOSYN)  IV  3.375 g Intravenous Q8H  . sodium chloride  10-40 mL Intracatheter Q12H    Objective: Vital signs in last 24 hours: Temp:  [98 F (36.7 C)-99.3 F (37.4 C)] 98 F (36.7 C) (02/07 0514) Pulse Rate:  [100-122] 112 (02/07 0600) Resp:  [16-18] 17 (02/07 0514) BP: (135-150)/(81-91) 150/84 mmHg (02/07 0514) SpO2:  [98 %-100 %] 99 % (02/07 0514) Weight:  [94.4 kg (208 lb 1.8 oz)] 94.4 kg (208 lb 1.8 oz) (02/07 0500)   General appearance: alert, cooperative, fatigued and no distress Resp: clear to auscultation bilaterally Cardio: regular rate and rhythm GI: normal findings: bowel sounds normal and soft, non-tender and drains in place. purulent d/c in bulbs.   Lab Results  Recent Labs  09/25/13 0420 09/26/13 0600 09/26/13 0830 09/27/13 0453  WBC 13.4*  --  14.6* 11.1*  HGB 10.7*  --  11.5* 10.6*  HCT 32.7*  --  34.5* 32.2*  NA 138 135*  --   --   K 4.5 4.6  --   --   CL 104 103  --   --   CO2 18* 20  --   --   BUN 49* 44*  --   --   CREATININE 1.37* 1.21  --   --    Liver Panel  Recent Labs  09/25/13 0420  PROT 7.3  ALBUMIN 2.3*  AST 20  ALT 17  ALKPHOS 118*    BILITOT 0.5   Sedimentation Rate No results found for this basename: ESRSEDRATE,  in the last 72 hours C-Reactive Protein No results found for this basename: CRP,  in the last 72 hours  Microbiology: Recent Results (from the past 240 hour(s))  URINE CULTURE     Status: None   Collection Time    09/19/13  9:50 AM      Result Value Range Status   Specimen Description URINE, CLEAN CATCH   Final   Special Requests Normal   Final   Culture  Setup Time     Final   Value: 09/19/2013 10:37     Performed at [facility] Count     Final   Value: NO GROWTH     Performed at [facility]   Culture     Final   Value: NO GROWTH     Performed at [facility]   Report Status 09/20/2013 FINAL   Final  CULTURE, ROUTINE-ABSCESS     Status: None   Collection Time    09/21/13 11:40 AM      Result Value Range Status   Specimen Description PERITONEAL CAVITY   Final   Special Requests Normal   Final   Gram Stain     Final   Value: ABUNDANT WBC PRESENT, PREDOMINANTLY PMN     NO SQUAMOUS EPITHELIAL CELLS SEEN     NO ORGANISMS SEEN     Performed at [facility]   Culture     Final   Value: FEW YEAST ISOLATED;ID TO FOLLOW     YEAST CONSISTENT WITH CANDIDA SPECIES     Performed at [facility]   Report Status PENDING   Incomplete  CULTURE, ROUTINE-ABSCESS     Status: None   Collection Time    09/26/13  4:02 PM      Result Value Range Status   Specimen Description ABSCESS PERITONEAL CAVITY   Final   Special Requests RIGHT LATERAL ABOMINAL   Final   Gram Stain PENDING   Incomplete   Culture     Final   Value: NO GROWTH 1 DAY     Performed at [facility]   Report Status PENDING   Incomplete    Studies/Results: Ct Image Guided Drainage By Percutaneous Catheter  09/26/2013   CLINICAL DATA:  History of gastric perforation and intra-abdominal fluid collections.  EXAM: CT GUIDED DRAINAGE OF RIGHT LATERAL ABDOMINAL FLUID COLLECTION   ANESTHESIA/SEDATION: 1.0 mg IV Versed 50 mcg IV Fentanyl. A radiology nurse monitored the patient for moderate sedation.  Total Moderate Sedation Time:  9 minutes  PROCEDURE: Informed consent was obtained for a CT-guided drainage. Patient was placed supine on the CT scanner. Images through the upper abdomen were obtained. The right side of the abdomen was prepped and draped in sterile fashion. The skin was anesthetized with 1% lidocaine. An 18 gauge needle was directed into the fluid collection and thick yellow fluid was aspirated. A stiff Amplatz wire was placed. The tract was dilated and a 10 [place] multipurpose drain was a advanced into the collection. Approximately 30 mL of thick yellow fluid was removed. The catheter was sutured to the skin and attached to a suction bulb. A sterile dressing was placed over the catheter site.  COMPLICATIONS: None  FINDINGS: Fluid collection along the right lateral abdomen and perihepatic space. Again noted is diffuse thickening in the anterior abdomen and omentum with scattered pockets of gas. Again noted is a fluid collection in the left upper quadrant.  IMPRESSION: CT-guided drainage of the right lateral abdominal fluid collection.   Electronically Signed   By: Richarda Overlie M.D.   On: 09/26/2013 18:09  Assessment/Plan: Multiple abd abscesses  Cx 2-1 Candida sp C diff Perforated gastric ulcer, repair (08-28-13)  Cx from 2-6 abscess aspiration so far is no growth.  Await ID and sensi of candida sp.  WBC better today Drain out 40 yesterday, 58 today  Total days of antibiotics: Day 4 piperacillin tazobactam   1/8-13   Day 18 metronidazole  Day 2 Micafungin  1/9-20  fluconazole  1/31-2/6  IV vancomycin 1/8-30  Imipenem 1/14-22           Johny SaxJeffrey Kensli Bowley Infectious Diseases (pager) [phone] [email or web address] 09/27/2013, 3:46 PM  LOS: 30 days

## 2013-09-27 NOTE — Progress Notes (Signed)
Patient's TPN ran out at 0610.  IV team notified and D10 hung.  D10 running at 28430ml/hr until 0700 and then will switch to 2050ml/hr until 0800.  TPN also ran out morning of 2/6 per previous nurse, so pharmacy was notified about the ongoing issue.

## 2013-09-27 NOTE — Progress Notes (Signed)
PARENTERAL NUTRITION CONSULT NOTE - FOLLOW UP  Pharmacy Consult for TPN Indication: Prolonged ileus  No Known Allergies  Patient Measurements: Height: '5\' 11"'  (180.3 cm) Weight: 208 lb 1.8 oz (94.4 kg) IBW/kg (Calculated) : 75.3 Vital Signs: Temp: 98 F (36.7 C) (02/07 0514) Temp src: Oral (02/07 0514) BP: 150/84 mmHg (02/07 0514) Pulse Rate: 112 (02/07 0600) Intake/Output from previous day: 02/06 0701 - 02/07 0700 In: 4034 [I.V.:482; NG/GT:100; IV Piggyback:550; TWS:5681] Out: 2751 [Urine:3450; Emesis/NG output:450; Drains:40] Intake/Output from this shift:    Labs:  Recent Labs  09/25/13 0420 09/26/13 0830 09/27/13 0453  WBC 13.4* 14.6* 11.1*  HGB 10.7* 11.5* 10.6*  HCT 32.7* 34.5* 32.2*  PLT 283 269 275    Recent Labs  09/25/13 0420 09/26/13 0600  NA 138 135*  K 4.5 4.6  CL 104 103  CO2 18* 20  GLUCOSE 144* 130*  BUN 49* 44*  CREATININE 1.37* 1.21  CALCIUM 8.0* 8.0*  MG 2.2  --   PHOS 4.3  --   PROT 7.3  --   ALBUMIN 2.3*  --   AST 20  --   ALT 17  --   ALKPHOS 118*  --   BILITOT 0.5  --    Estimated Creatinine Clearance: 86.6 ml/min (by C-G formula based on Cr of 1.21).    Recent Labs  09/26/13 2024 09/27/13 0002 09/27/13 0616  GLUCAP 176* 178* 160*   Insulin Requirements in the past 24 hours:  30 units insulin in TPN + 6 units from SSI during 12 hr cycle  Current Nutrition:  Clinimix E 5/15 2400 ml over 12 hours  + lipid emulsion 20% at 240 ml over 12 hr NPO  Nutritional Goals:  Goal 2250-2450 kcal and 130-140 gm protein per RD 09/23/13  Assessment:  48 y.o. male presented to ED 1/8 with abd pain. CT consistent with perforated stomach- ulcer due to NSAID use, pneumoperitoneum, peritonitis. Taken emergently to OR where he underwent exp lap, debridement of necrotic stomach, repair of perforated stomach. Contrast study showed no leak from stomach or small bowel.  CT scan 1/31 poss persistent gastric perf, fluid collection 4.5x4.9, and 3rd  collection between bladder and rectum. 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 Will require NPO/NG/TNA for a long time. TPN transitioned to cyclic over 12 hour rate.   GI: Back to NPO status 1/31. 2/1. Large stomach wall defect and fistula. CT scan 1/31 - likely persistent gastric perforation, fluid collections that may be abscesses. 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 Will require NPO/NG/TNA for a long time. TPN transitioned to cyclic.   Nutrition: prealbumin up to 19.3 from 10.7, moving in the right direction  Endo: No h/o DM. CBGs 160-178 during 12 hour cycle with 30 units of insulin in 2400 ml bag.   Lytes: K, Mg and phos all within normal limits on 2/6  Renal: Scr 1.21-  Good UOP.  ID:  Vanc 1/8>>1/21 Zosyn 1/8>>1/14 Micafungin 1/9>>1/21  .. 2/6>> Primaxin 1/14>>1/23  Vanc PO 1/21>>1/31 -- Flagyl 1/21>> Zosyn 2/4>> Fluconazole 1/31>>2/6  Cultures: 2/1 peritoneal cavity>>candida species 1/30 urine>> NG 1/21 CDiff Positive 1/16 Trach aspirate no growth 1/9 Blood cultures x 2>> no growth 2/6 peritoneal abscess>>  Hepatobil: LFTs wnl except alk phos of 118. Trig down to 117.  TPN Access: triple lumen PICC 1/20; replaced 1/27  TPN day#: 29  Plan:  1. Continue Clinimix E 5/15 (with electrolytes) + lipids 2. Continue cyclic rate over 12 hours to provide 2400  mls = full support.    3. Continue multivitamin and full trace elements in TPN 4. increase regular insulin in TPN to 40 units, customize times for CBGs to monitor cyclic rate 5. Continue Zosyn at current dose.  Heide Guile, PharmD, BCPS Clinical Pharmacist Pager [phone]   09/27/2013 7:04 AM

## 2013-09-27 NOTE — Progress Notes (Signed)
30 Days Post-Op  Subjective: Rt lateral abd abscess drain placed 2/6 Pt feels better already  Objective: Vital signs in last 24 hours: Temp:  [98 F (36.7 C)-99.3 F (37.4 C)] 98 F (36.7 C) (02/07 0514) Pulse Rate:  [100-122] 112 (02/07 0600) Resp:  [16-21] 17 (02/07 0514) BP: (115-150)/(78-91) 150/84 mmHg (02/07 0514) SpO2:  [98 %-100 %] 99 % (02/07 0514) Weight:  [208 lb 1.8 oz (94.4 kg)] 208 lb 1.8 oz (94.4 kg) (02/07 0500) Last BM Date: 09/25/13  Intake/Output from previous day: 02/06 0701 - 02/07 0700 In: 4034 [I.V.:482; NG/GT:100; IV Piggyback:550; JXB:1478]TPN:2892] Out: 3940 [Urine:3450; Emesis/NG output:450; Drains:40] Intake/Output this shift:    PE:  Afeb; vss In NAD Wbc 11.1 Output 20 cc recorded; 10 cc in JP--milky pus Site clean and dry; NT cx pending  Lab Results:   Recent Labs  09/26/13 0830 09/27/13 0453  WBC 14.6* 11.1*  HGB 11.5* 10.6*  HCT 34.5* 32.2*  PLT 269 275   BMET  Recent Labs  09/25/13 0420 09/26/13 0600  NA 138 135*  K 4.5 4.6  CL 104 103  CO2 18* 20  GLUCOSE 144* 130*  BUN 49* 44*  CREATININE 1.37* 1.21  CALCIUM 8.0* 8.0*   PT/INR No results found for this basename: LABPROT, INR,  in the last 72 hours ABG No results found for this basename: PHART, PCO2, PO2, HCO3,  in the last 72 hours  Studies/Results: Ct Abdomen Pelvis W Contrast  09/25/2013   CLINICAL DATA:  Perforated viscus, post gastric repair  EXAM: CT ABDOMEN AND PELVIS WITH CONTRAST  TECHNIQUE: Multidetector CT imaging of the abdomen and pelvis was performed using the standard protocol following bolus administration of intravenous contrast. Sagittal and coronal MPR images reconstructed from axial data set.  CONTRAST:  80mL OMNIPAQUE IOHEXOL 300 MG/ML SOLN. Dilute oral contrast.  COMPARISON:  09/20/2013  FINDINGS: Persistent partial right lower lobe atelectasis.  Nasogastric tube extends into stomach.  Two percutaneous surgical drains in upper abdomen.  Persistent stranding  of upper abdominal fat adjacent to drains with scattered free air, unchanged.  Liver, spleen, pancreas, kidneys, and adrenal glands normal appearance.  Scattered upper abdominal ascites.  Loculated pockets of fluid are seen in the abdomen and pelvis, sterile versus infected.  Left upper quadrant collection 5.5 x 4.0 cm image 36 (previously 5.5 x 4.9 cm).  Collection inferior to stomach 3.2 by 1.9 cm image 46 unchanged.  Smaller left upper quadrant collection 2.3 x 1.7 cm image 43,) previously 2.4 x 1.9 cm).  Triangular collection in right pelvis again identified, 3.3 x 4.4 cm image 82, previously 3.0 x 4.1 cm.  Additional loculated collection in cul de sac containing foci of gas measures 5.3 x 3.7 cm, stable.  Loculated fluid collection superior to the bladder is smaller than on the previous exam, 6.7 x 5.1 cm image 93 previously 12.3 x 5.0 cm.  Open ventral wound upper abdomen.  No bowel wall thickening or evidence of obstruction.  Normal appendix.  Stomach decompressed with scattered wall thickening.  Other than distal colonic diverticulosis, bowel loops unremarkable.  No mass, adenopathy or hernia.  No acute osseous findings.  IMPRESSION: Multiple fluid collections throughout the abdomen and pelvis, ranging from stable to mildly decreased in sizes versus previous study.  No definite new or enlarging collections identified.  Persistent inflammatory changes in left upper quadrant with persistent gastric wall thickening, upper abdominal fat infiltration and foci of gas as well as surgical drains.  No new significant  intra-abdominal or intrapelvic findings.   Electronically Signed   By: Ulyses Southward M.D.   On: 09/25/2013 14:20   Ct Image Guided Drainage By Percutaneous Catheter  09/26/2013   CLINICAL DATA:  History of gastric perforation and intra-abdominal fluid collections.  EXAM: CT GUIDED DRAINAGE OF RIGHT LATERAL ABDOMINAL FLUID COLLECTION  ANESTHESIA/SEDATION: 1.0 mg IV Versed 50 mcg IV Fentanyl. A radiology  nurse monitored the patient for moderate sedation.  Total Moderate Sedation Time:  9 minutes  PROCEDURE: Informed consent was obtained for a CT-guided drainage. Patient was placed supine on the CT scanner. Images through the upper abdomen were obtained. The right side of the abdomen was prepped and draped in sterile fashion. The skin was anesthetized with 1% lidocaine. An 18 gauge needle was directed into the fluid collection and thick yellow fluid was aspirated. A stiff Amplatz wire was placed. The tract was dilated and a 10 [place] multipurpose drain was a advanced into the collection. Approximately 30 mL of thick yellow fluid was removed. The catheter was sutured to the skin and attached to a suction bulb. A sterile dressing was placed over the catheter site.  COMPLICATIONS: None  FINDINGS: Fluid collection along the right lateral abdomen and perihepatic space. Again noted is diffuse thickening in the anterior abdomen and omentum with scattered pockets of gas. Again noted is a fluid collection in the left upper quadrant.  IMPRESSION: CT-guided drainage of the right lateral abdominal fluid collection.   Electronically Signed   By: Richarda Overlie M.D.   On: 09/26/2013 18:09    Anti-infectives: Anti-infectives   Start     Dose/Rate Route Frequency Ordered Stop   09/26/13 1800  vancomycin (VANCOCIN) 50 mg/mL oral solution 250 mg  Status:  Discontinued     250 mg Oral 4 times per day 09/26/13 1510 09/26/13 1554   09/26/13 1600  metroNIDAZOLE (FLAGYL) IVPB 500 mg     500 mg 100 mL/hr over 60 Minutes Intravenous Every 6 hours 09/26/13 1556     09/26/13 1515  micafungin (MYCAMINE) 100 mg in sodium chloride 0.9 % 100 mL IVPB     100 mg 100 mL/hr over 1 Hours Intravenous Daily 09/26/13 1510     09/24/13 0930  piperacillin-tazobactam (ZOSYN) IVPB 3.375 g     3.375 g 12.5 mL/hr over 240 Minutes Intravenous 3 times per day 09/24/13 0832     09/22/13 1400  metroNIDAZOLE (FLAGYL) IVPB 500 mg  Status:  Discontinued      500 mg 100 mL/hr over 60 Minutes Intravenous Every 8 hours 09/22/13 1119 09/26/13 1510   09/20/13 1230  fluconazole (DIFLUCAN) IVPB 200 mg  Status:  Discontinued     200 mg 100 mL/hr over 60 Minutes Intravenous Every 24 hours 09/20/13 1154 09/26/13 1510   09/20/13 1200  fluconazole (DIFLUCAN) IVPB 100 mg  Status:  Discontinued     100 mg 50 mL/hr over 60 Minutes Intravenous Every 24 hours 09/20/13 1151 09/20/13 1154   09/10/13 2200  metroNIDAZOLE (FLAGYL) IVPB 500 mg  Status:  Discontinued     500 mg 100 mL/hr over 60 Minutes Intravenous 3 times per day 09/10/13 1802 09/22/13 1118   09/10/13 2000  vancomycin (VANCOCIN) 50 mg/mL oral solution 500 mg  Status:  Discontinued     500 mg Oral 4 times per day 09/10/13 1802 09/21/13 1151   09/10/13 1800  vancomycin (VANCOCIN) 50 mg/mL oral solution 500 mg  Status:  Discontinued     500 mg Oral 4 times  per day 09/10/13 1432 09/10/13 1802   09/10/13 1600  metroNIDAZOLE (FLAGYL) IVPB 500 mg  Status:  Discontinued     500 mg 100 mL/hr over 60 Minutes Intravenous 3 times per day 09/10/13 1432 09/10/13 1802   09/09/13 1602  vancomycin (VANCOCIN) 1,250 mg in sodium chloride 0.9 % 250 mL IVPB  Status:  Discontinued     1,250 mg 166.7 mL/hr over 90 Minutes Intravenous Every 24 hours 09/09/13 1057 09/10/13 1432   09/06/13 1600  imipenem-cilastatin (PRIMAXIN) 500 mg in sodium chloride 0.9 % 100 mL IVPB  Status:  Discontinued     500 mg 200 mL/hr over 30 Minutes Intravenous 3 times per day 09/06/13 1310 09/12/13 1048   09/03/13 1100  imipenem-cilastatin (PRIMAXIN) 500 mg in sodium chloride 0.9 % 100 mL IVPB  Status:  Discontinued     500 mg 200 mL/hr over 30 Minutes Intravenous Every 6 hours 09/03/13 1007 09/06/13 1310   09/02/13 1300  vancomycin (VANCOCIN) IVPB 1000 mg/200 mL premix  Status:  Discontinued     1,000 mg 200 mL/hr over 60 Minutes Intravenous Every 12 hours 09/01/13 1527 09/07/13 1340   08/30/13 2000  vancomycin (VANCOCIN) IVPB 750 mg/150  ml premix  Status:  Discontinued     750 mg 150 mL/hr over 60 Minutes Intravenous Every 8 hours 08/30/13 1356 09/01/13 1527   08/29/13 2200  micafungin (MYCAMINE) 100 mg in sodium chloride 0.9 % 100 mL IVPB  Status:  Discontinued     100 mg 100 mL/hr over 1 Hours Intravenous Every 24 hours 08/29/13 2036 09/10/13 1432   08/29/13 0400  vancomycin (VANCOCIN) IVPB 1000 mg/200 mL premix  Status:  Discontinued     1,000 mg 200 mL/hr over 60 Minutes Intravenous Every 8 hours 08/28/13 2358 08/30/13 1356   08/29/13 0400  piperacillin-tazobactam (ZOSYN) IVPB 3.375 g  Status:  Discontinued     3.375 g 12.5 mL/hr over 240 Minutes Intravenous 3 times per day 08/28/13 2358 09/03/13 1002   08/28/13 1845  [MAR Hold]  vancomycin (VANCOCIN) IVPB 1000 mg/200 mL premix     (On MAR Hold since 08/28/13 1957)   1,000 mg 200 mL/hr over 60 Minutes Intravenous  Once 08/28/13 1831 08/28/13 1955   08/28/13 1845  [MAR Hold]  piperacillin-tazobactam (ZOSYN) IVPB 3.375 g     (On MAR Hold since 08/28/13 1957)   3.375 g 100 mL/hr over 30 Minutes Intravenous  Once 08/28/13 1831 08/28/13 2056      Assessment/Plan: s/p Procedure(s): EXPLORATORY LAPAROTOMY,debridment of nacrotic stomach,and primary repair of perforated stomach. (N/A)   R lat abd abscess drain intact Pt some better Will follow Plan per CCM   LOS: 30 days    Mena Lienau A 09/27/2013

## 2013-09-28 DIAGNOSIS — K255 Chronic or unspecified gastric ulcer with perforation: Secondary | ICD-10-CM

## 2013-09-28 LAB — GLUCOSE, CAPILLARY
Glucose-Capillary: 123 mg/dL — ABNORMAL HIGH (ref 70–99)
Glucose-Capillary: 114 mg/dL — ABNORMAL HIGH (ref 70–99)
Glucose-Capillary: 89 mg/dL (ref 70–99)

## 2013-09-28 MED ORDER — FAT EMULSION 20 % IV EMUL
240.0000 mL | INTRAVENOUS | Status: AC
  Administered 2013-09-28: 240 mL via INTRAVENOUS
  Filled 2013-09-28 (×2): qty 250

## 2013-09-28 MED ORDER — PHENOL 1.4 % MT LIQD
1.0000 | OROMUCOSAL | Status: DC | PRN
  Administered 2013-09-28: 1 via OROMUCOSAL
  Filled 2013-09-28: qty 177

## 2013-09-28 MED ORDER — M.V.I. ADULT IV INJ
INTRAVENOUS | Status: AC
  Administered 2013-09-28: 18:00:00 via INTRAVENOUS
  Filled 2013-09-28: qty 2400

## 2013-09-28 NOTE — Progress Notes (Signed)
31 Days Post-Op  Subjective: No abdominal complaints - minimal drain output Complaining of severe dry mouth Objective: Vital signs in last 24 hours: Temp:  [98.4 F (36.9 C)-99 F (37.2 C)] 98.5 F (36.9 C) (02/08 0602) Pulse Rate:  [114-122] 122 (02/08 0602) Resp:  [18] 18 (02/07 2241) BP: (124-133)/(84-88) 127/84 mmHg (02/08 0602) SpO2:  [97 %-99 %] 97 % (02/08 0602) Last BM Date: 09/25/13  Intake/Output from previous day: 02/07 0701 - 02/08 0700 In: 2880 [I.V.:328; NG/GT:100; IV Piggyback:200; ZOX:0960]TPN:2247] Out: 2115 [Urine:1940; Emesis/NG output:150; Drains:25] Intake/Output this shift:    General appearance: alert, cooperative and no distress Wound - dehiscence, but clean and granulating well Drains - minimal milky discharge Lab Results:   Recent Labs  09/26/13 0830 09/27/13 0453  WBC 14.6* 11.1*  HGB 11.5* 10.6*  HCT 34.5* 32.2*  PLT 269 275   BMET  Recent Labs  09/26/13 0600  NA 135*  K 4.6  CL 103  CO2 20  GLUCOSE 130*  BUN 44*  CREATININE 1.21  CALCIUM 8.0*   PT/INR No results found for this basename: LABPROT, INR,  in the last 72 hours ABG No results found for this basename: PHART, PCO2, PO2, HCO3,  in the last 72 hours  Studies/Results: Ct Image Guided Drainage By Percutaneous Catheter  09/26/2013   CLINICAL DATA:  History of gastric perforation and intra-abdominal fluid collections.  EXAM: CT GUIDED DRAINAGE OF RIGHT LATERAL ABDOMINAL FLUID COLLECTION  ANESTHESIA/SEDATION: 1.0 mg IV Versed 50 mcg IV Fentanyl. A radiology nurse monitored the patient for moderate sedation.  Total Moderate Sedation Time:  9 minutes  PROCEDURE: Informed consent was obtained for a CT-guided drainage. Patient was placed supine on the CT scanner. Images through the upper abdomen were obtained. The right side of the abdomen was prepped and draped in sterile fashion. The skin was anesthetized with 1% lidocaine. An 18 gauge needle was directed into the fluid collection and thick  yellow fluid was aspirated. A stiff Amplatz wire was placed. The tract was dilated and a 10 [place] multipurpose drain was a advanced into the collection. Approximately 30 mL of thick yellow fluid was removed. The catheter was sutured to the skin and attached to a suction bulb. A sterile dressing was placed over the catheter site.  COMPLICATIONS: None  FINDINGS: Fluid collection along the right lateral abdomen and perihepatic space. Again noted is diffuse thickening in the anterior abdomen and omentum with scattered pockets of gas. Again noted is a fluid collection in the left upper quadrant.  IMPRESSION: CT-guided drainage of the right lateral abdominal fluid collection.   Electronically Signed   By: Richarda OverlieAdam  Henn M.D.   On: 09/26/2013 18:09    Anti-infectives: Anti-infectives   Start     Dose/Rate Route Frequency Ordered Stop   09/26/13 1800  vancomycin (VANCOCIN) 50 mg/mL oral solution 250 mg  Status:  Discontinued     250 mg Oral 4 times per day 09/26/13 1510 09/26/13 1554   09/26/13 1600  metroNIDAZOLE (FLAGYL) IVPB 500 mg     500 mg 100 mL/hr over 60 Minutes Intravenous Every 6 hours 09/26/13 1556     09/26/13 1515  micafungin (MYCAMINE) 100 mg in sodium chloride 0.9 % 100 mL IVPB     100 mg 100 mL/hr over 1 Hours Intravenous Daily 09/26/13 1510     09/24/13 0930  piperacillin-tazobactam (ZOSYN) IVPB 3.375 g     3.375 g 12.5 mL/hr over 240 Minutes Intravenous 3 times per day 09/24/13 [id number]  09/22/13 1400  metroNIDAZOLE (FLAGYL) IVPB 500 mg  Status:  Discontinued     500 mg 100 mL/hr over 60 Minutes Intravenous Every 8 hours 09/22/13 1119 09/26/13 1510   09/20/13 1230  fluconazole (DIFLUCAN) IVPB 200 mg  Status:  Discontinued     200 mg 100 mL/hr over 60 Minutes Intravenous Every 24 hours 09/20/13 1154 09/26/13 1510   09/20/13 1200  fluconazole (DIFLUCAN) IVPB 100 mg  Status:  Discontinued     100 mg 50 mL/hr over 60 Minutes Intravenous Every 24 hours 09/20/13 1151 09/20/13 1154    09/10/13 2200  metroNIDAZOLE (FLAGYL) IVPB 500 mg  Status:  Discontinued     500 mg 100 mL/hr over 60 Minutes Intravenous 3 times per day 09/10/13 1802 09/22/13 1118   09/10/13 2000  vancomycin (VANCOCIN) 50 mg/mL oral solution 500 mg  Status:  Discontinued     500 mg Oral 4 times per day 09/10/13 1802 09/21/13 1151   09/10/13 1800  vancomycin (VANCOCIN) 50 mg/mL oral solution 500 mg  Status:  Discontinued     500 mg Oral 4 times per day 09/10/13 1432 09/10/13 1802   09/10/13 1600  metroNIDAZOLE (FLAGYL) IVPB 500 mg  Status:  Discontinued     500 mg 100 mL/hr over 60 Minutes Intravenous 3 times per day 09/10/13 1432 09/10/13 1802   09/09/13 1602  vancomycin (VANCOCIN) 1,250 mg in sodium chloride 0.9 % 250 mL IVPB  Status:  Discontinued     1,250 mg 166.7 mL/hr over 90 Minutes Intravenous Every 24 hours 09/09/13 1057 09/10/13 1432   09/06/13 1600  imipenem-cilastatin (PRIMAXIN) 500 mg in sodium chloride 0.9 % 100 mL IVPB  Status:  Discontinued     500 mg 200 mL/hr over 30 Minutes Intravenous 3 times per day 09/06/13 1310 09/12/13 1048   09/03/13 1100  imipenem-cilastatin (PRIMAXIN) 500 mg in sodium chloride 0.9 % 100 mL IVPB  Status:  Discontinued     500 mg 200 mL/hr over 30 Minutes Intravenous Every 6 hours 09/03/13 1007 09/06/13 1310   09/02/13 1300  vancomycin (VANCOCIN) IVPB 1000 mg/200 mL premix  Status:  Discontinued     1,000 mg 200 mL/hr over 60 Minutes Intravenous Every 12 hours 09/01/13 1527 09/07/13 1340   08/30/13 2000  vancomycin (VANCOCIN) IVPB 750 mg/150 ml premix  Status:  Discontinued     750 mg 150 mL/hr over 60 Minutes Intravenous Every 8 hours 08/30/13 1356 09/01/13 1527   08/29/13 2200  micafungin (MYCAMINE) 100 mg in sodium chloride 0.9 % 100 mL IVPB  Status:  Discontinued     100 mg 100 mL/hr over 1 Hours Intravenous Every 24 hours 08/29/13 2036 09/10/13 1432   08/29/13 0400  vancomycin (VANCOCIN) IVPB 1000 mg/200 mL premix  Status:  Discontinued     1,000 mg 200  mL/hr over 60 Minutes Intravenous Every 8 hours 08/28/13 2358 08/30/13 1356   08/29/13 0400  piperacillin-tazobactam (ZOSYN) IVPB 3.375 g  Status:  Discontinued     3.375 g 12.5 mL/hr over 240 Minutes Intravenous 3 times per day 08/28/13 2358 09/03/13 1002   08/28/13 1845  [MAR Hold]  vancomycin (VANCOCIN) IVPB 1000 mg/200 mL premix     (On MAR Hold since 08/28/13 1957)   1,000 mg 200 mL/hr over 60 Minutes Intravenous  Once 08/28/13 1831 08/28/13 1955   08/28/13 1845  [MAR Hold]  piperacillin-tazobactam (ZOSYN) IVPB 3.375 g     (On MAR Hold since 08/28/13 1957)   3.375 g 100  mL/hr over 30 Minutes Intravenous  Once 08/28/13 1831 08/28/13 2056      Assessment/Plan: s/p Procedure(s): EXPLORATORY LAPAROTOMY,debridment of necrotic stomach,and primary repair of perforated stomach. (N/A) Continue TNA/ antibiotics May have a few ice chips to wet his mouth Appreciate ID assistance  LOS: 31 days    James Mccormick K. 09/28/2013

## 2013-09-28 NOTE — Progress Notes (Signed)
PARENTERAL NUTRITION CONSULT NOTE - FOLLOW UP  Pharmacy Consult for TPN Indication: Prolonged ileus  No Known Allergies  Patient Measurements: Height: '5\' 11"'  (180.3 cm) Weight: 208 lb 1.8 oz (94.4 kg) IBW/kg (Calculated) : 75.3 Vital Signs: Temp: 98.5 F (36.9 C) (02/08 0602) Temp src: Oral (02/08 0602) BP: 127/84 mmHg (02/08 0602) Pulse Rate: 122 (02/08 0602) Intake/Output from previous day: 02/07 0701 - 02/08 0700 In: 2880 [I.V.:328; NG/GT:100; IV Piggyback:200; WKM:6286] Out: 2115 [Urine:1940; Emesis/NG output:150; Drains:25] Intake/Output from this shift:    Labs:  Recent Labs  09/26/13 0830 09/27/13 0453  WBC 14.6* 11.1*  HGB 11.5* 10.6*  HCT 34.5* 32.2*  PLT 269 275    Recent Labs  09/26/13 0600  NA 135*  K 4.6  CL 103  CO2 20  GLUCOSE 130*  BUN 44*  CREATININE 1.21  CALCIUM 8.0*   Estimated Creatinine Clearance: 86.6 ml/min (by C-G formula based on Cr of 1.21).    Recent Labs  09/27/13 1744 09/27/13 2354 09/28/13 0634  GLUCAP 96 149* 123*   Insulin Requirements in the past 24 hours:  40 units insulin in TPN + 4 units from SSI during 12 hr cycle  Current Nutrition:  Clinimix E 5/15 2400 ml over 12 hours  + lipid emulsion 20% at 240 ml over 12 hr NPO  Nutritional Goals:  Goal 2250-2450 kcal and 130-140 gm protein per RD 09/23/13  Assessment:  48 y.o. male presented to ED 1/8 with abd pain. CT consistent with perforated stomach- ulcer due to NSAID use, pneumoperitoneum, peritonitis. Taken emergently to OR where he underwent exp lap, debridement of necrotic stomach, repair of perforated stomach. Contrast study showed no leak from stomach or small bowel.  CT scan 1/31 poss persistent gastric perf, fluid collection 4.5x4.9, and 3rd collection between bladder and rectum. 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 Will require NPO/NG/TNA for a long time. TPN transitioned to cyclic over 12 hour rate.   GI: Back to NPO status 1/31. 2/1. Large  stomach wall defect and fistula. CT scan 1/31 - likely persistent gastric perforation, fluid collections that may be abscesses. 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 Will require NPO/NG/TNA for a long time. TPN transitioned to cyclic.   Nutrition: prealbumin up to 19.3 from 10.7, moving in the right direction  Endo: No h/o DM. CBGs 123-149 during 12 hour cycle with 40 units of insulin in 2400 ml bag.   Lytes: K, Mg and phos all within normal limits on 2/6  Renal: Scr 1.21-  Good UOP.  ID:  Vanc 1/8>>1/21 Zosyn 1/8>>1/14 Micafungin 1/9>>1/21  .. 2/6>> Primaxin 1/14>>1/23  Vanc PO 1/21>>1/31 -- Flagyl 1/21>> Zosyn 2/4>> Fluconazole 1/31>>2/6  Cultures: 2/1 peritoneal cavity>>candida species 1/30 urine>> NG 1/21 CDiff Positive 1/16 Trach aspirate no growth 1/9 Blood cultures x 2>> no growth 2/6 peritoneal abscess>>  Hepatobil: LFTs wnl except alk phos of 118. Trig down to 117.  TPN Access: triple lumen PICC 1/20; replaced 1/27  TPN day#: 30  Plan:  1. Continue Clinimix E 5/15 (with electrolytes) + lipids 2. Continue cyclic rate over 12 hours to provide 2400 mls = full support.    3. Continue multivitamin and full trace elements in TPN 4. Continue regular insulin in TPN to 40 units, customize times for CBGs to monitor cyclic rate 5. Continue Zosyn at current dose.  Heide Guile, PharmD, BCPS Clinical Pharmacist Pager [phone]   09/28/2013 7:14 AM

## 2013-09-28 NOTE — Progress Notes (Signed)
INFECTIOUS DISEASE PROGRESS NOTE  ID: James Mccormick is a 48 y.o. male with  Principal Problem:   Intra-abdominal abscess Active Problems:   Perforated gastric ulcer   ARDS (adult respiratory distress syndrome)   Hypocalcemia   Hypophosphatemia   Protein-calorie malnutrition, moderate   Atrial fibrillation   Pleural effusion   C. difficile colitis   Depression   Cigarette smoker  Subjective: Feels better  Abtx:  Anti-infectives   Start     Dose/Rate Route Frequency Ordered Stop   09/26/13 1800  vancomycin (VANCOCIN) 50 mg/mL oral solution 250 mg  Status:  Discontinued     250 mg Oral 4 times per day 09/26/13 1510 09/26/13 1554   09/26/13 1600  metroNIDAZOLE (FLAGYL) IVPB 500 mg     500 mg 100 mL/hr over 60 Minutes Intravenous Every 6 hours 09/26/13 1556     09/26/13 1515  micafungin (MYCAMINE) 100 mg in sodium chloride 0.9 % 100 mL IVPB     100 mg 100 mL/hr over 1 Hours Intravenous Daily 09/26/13 1510     09/24/13 0930  piperacillin-tazobactam (ZOSYN) IVPB 3.375 g     3.375 g 12.5 mL/hr over 240 Minutes Intravenous 3 times per day 09/24/13 0832     09/22/13 1400  metroNIDAZOLE (FLAGYL) IVPB 500 mg  Status:  Discontinued     500 mg 100 mL/hr over 60 Minutes Intravenous Every 8 hours 09/22/13 1119 09/26/13 1510   09/20/13 1230  fluconazole (DIFLUCAN) IVPB 200 mg  Status:  Discontinued     200 mg 100 mL/hr over 60 Minutes Intravenous Every 24 hours 09/20/13 1154 09/26/13 1510   09/20/13 1200  fluconazole (DIFLUCAN) IVPB 100 mg  Status:  Discontinued     100 mg 50 mL/hr over 60 Minutes Intravenous Every 24 hours 09/20/13 1151 09/20/13 1154   09/10/13 2200  metroNIDAZOLE (FLAGYL) IVPB 500 mg  Status:  Discontinued     500 mg 100 mL/hr over 60 Minutes Intravenous 3 times per day 09/10/13 1802 09/22/13 1118   09/10/13 2000  vancomycin (VANCOCIN) 50 mg/mL oral solution 500 mg  Status:  Discontinued     500 mg Oral 4 times per day 09/10/13 1802 09/21/13 1151   09/10/13  1800  vancomycin (VANCOCIN) 50 mg/mL oral solution 500 mg  Status:  Discontinued     500 mg Oral 4 times per day 09/10/13 1432 09/10/13 1802   09/10/13 1600  metroNIDAZOLE (FLAGYL) IVPB 500 mg  Status:  Discontinued     500 mg 100 mL/hr over 60 Minutes Intravenous 3 times per day 09/10/13 1432 09/10/13 1802   09/09/13 1602  vancomycin (VANCOCIN) 1,250 mg in sodium chloride 0.9 % 250 mL IVPB  Status:  Discontinued     1,250 mg 166.7 mL/hr over 90 Minutes Intravenous Every 24 hours 09/09/13 1057 09/10/13 1432   09/06/13 1600  imipenem-cilastatin (PRIMAXIN) 500 mg in sodium chloride 0.9 % 100 mL IVPB  Status:  Discontinued     500 mg 200 mL/hr over 30 Minutes Intravenous 3 times per day 09/06/13 1310 09/12/13 1048   09/03/13 1100  imipenem-cilastatin (PRIMAXIN) 500 mg in sodium chloride 0.9 % 100 mL IVPB  Status:  Discontinued     500 mg 200 mL/hr over 30 Minutes Intravenous Every 6 hours 09/03/13 1007 09/06/13 1310   09/02/13 1300  vancomycin (VANCOCIN) IVPB 1000 mg/200 mL premix  Status:  Discontinued     1,000 mg 200 mL/hr over 60 Minutes Intravenous Every 12 hours 09/01/13 1527 09/07/13  1340   08/30/13 2000  vancomycin (VANCOCIN) IVPB 750 mg/150 ml premix  Status:  Discontinued     750 mg 150 mL/hr over 60 Minutes Intravenous Every 8 hours 08/30/13 1356 09/01/13 1527   08/29/13 2200  micafungin (MYCAMINE) 100 mg in sodium chloride 0.9 % 100 mL IVPB  Status:  Discontinued     100 mg 100 mL/hr over 1 Hours Intravenous Every 24 hours 08/29/13 2036 09/10/13 1432   08/29/13 0400  vancomycin (VANCOCIN) IVPB 1000 mg/200 mL premix  Status:  Discontinued     1,000 mg 200 mL/hr over 60 Minutes Intravenous Every 8 hours 08/28/13 2358 08/30/13 1356   08/29/13 0400  piperacillin-tazobactam (ZOSYN) IVPB 3.375 g  Status:  Discontinued     3.375 g 12.5 mL/hr over 240 Minutes Intravenous 3 times per day 08/28/13 2358 09/03/13 1002   08/28/13 1845  [MAR Hold]  vancomycin (VANCOCIN) IVPB 1000 mg/200 mL  premix     (On MAR Hold since 08/28/13 1957)   1,000 mg 200 mL/hr over 60 Minutes Intravenous  Once 08/28/13 1831 08/28/13 1955   08/28/13 1845  [MAR Hold]  piperacillin-tazobactam (ZOSYN) IVPB 3.375 g     (On MAR Hold since 08/28/13 1957)   3.375 g 100 mL/hr over 30 Minutes Intravenous  Once 08/28/13 1831 08/28/13 2056      Medications:  Scheduled: . antiseptic oral rinse  15 mL Mouth Rinse q12n4p  . chlorhexidine  15 mL Mouth Rinse BID  . enoxaparin (LOVENOX) injection  40 mg Subcutaneous Q24H  . insulin aspart  0-15 Units Subcutaneous Custom  . metoprolol  5 mg Intravenous Q6H  . metronidazole  500 mg Intravenous Q6H  . micafungin (MYCAMINE) IV  100 mg Intravenous Daily  . pantoprazole (PROTONIX) IV  40 mg Intravenous Q12H  . piperacillin-tazobactam (ZOSYN)  IV  3.375 g Intravenous Q8H  . sodium chloride  10-40 mL Intracatheter Q12H    Objective: Vital signs in last 24 hours: Temp:  [98.4 F (36.9 C)-99 F (37.2 C)] 98.5 F (36.9 C) (02/08 0602) Pulse Rate:  [114-122] 122 (02/08 0602) Resp:  [18] 18 (02/07 2241) BP: (124-133)/(84-88) 127/84 mmHg (02/08 0602) SpO2:  [97 %-99 %] 97 % (02/08 0602)   General appearance: alert, cooperative and no distress GI: normal findings: bowel sounds normal and soft, non-tender  Lab Results  Recent Labs  09/26/13 0600 09/26/13 0830 09/27/13 0453  WBC  --  14.6* 11.1*  HGB  --  11.5* 10.6*  HCT  --  34.5* 32.2*  NA 135*  --   --   K 4.6  --   --   CL 103  --   --   CO2 20  --   --   BUN 44*  --   --   CREATININE 1.21  --   --    Liver Panel No results found for this basename: PROT, ALBUMIN, AST, ALT, ALKPHOS, BILITOT, BILIDIR, IBILI,  in the last 72 hours Sedimentation Rate No results found for this basename: ESRSEDRATE,  in the last 72 hours C-Reactive Protein No results found for this basename: CRP,  in the last 72 hours  Microbiology: Recent Results (from the past 240 hour(s))  URINE CULTURE     Status: None    Collection Time    09/19/13  9:50 AM      Result Value Range Status   Specimen Description URINE, CLEAN CATCH   Final   Special Requests Normal   Final  Culture  Setup Time     Final   Value: 09/19/2013 10:37     Performed at [facility] Count     Final   Value: NO GROWTH     Performed at [facility]   Culture     Final   Value: NO GROWTH     Performed at [facility]   Report Status 09/20/2013 FINAL   Final  CULTURE, ROUTINE-ABSCESS     Status: None   Collection Time    09/21/13 11:40 AM      Result Value Range Status   Specimen Description PERITONEAL CAVITY   Final   Special Requests Normal   Final   Gram Stain     Final   Value: ABUNDANT WBC PRESENT, PREDOMINANTLY PMN     NO SQUAMOUS EPITHELIAL CELLS SEEN     NO ORGANISMS SEEN     Performed at [facility]   Culture     Final   Value: FEW YEAST ISOLATED;ID TO FOLLOW     YEAST CONSISTENT WITH CANDIDA SPECIES     Performed at [facility]   Report Status PENDING   Incomplete  CULTURE, ROUTINE-ABSCESS     Status: None   Collection Time    09/26/13  4:02 PM      Result Value Range Status   Specimen Description ABSCESS PERITONEAL CAVITY   Final   Special Requests RIGHT LATERAL ABOMINAL   Final   Gram Stain PENDING   Incomplete   Culture     Final   Value: NO GROWTH 2 DAYS     Performed at [facility]   Report Status PENDING   Incomplete    Studies/Results: Ct Image Guided Drainage By Percutaneous Catheter  09/26/2013   CLINICAL DATA:  History of gastric perforation and intra-abdominal fluid collections.  EXAM: CT GUIDED DRAINAGE OF RIGHT LATERAL ABDOMINAL FLUID COLLECTION  ANESTHESIA/SEDATION: 1.0 mg IV Versed 50 mcg IV Fentanyl. A radiology nurse monitored the patient for moderate sedation.  Total Moderate Sedation Time:  9 minutes  PROCEDURE: Informed consent was obtained for a CT-guided drainage. Patient was placed supine on the CT scanner. Images through the  upper abdomen were obtained. The right side of the abdomen was prepped and draped in sterile fashion. The skin was anesthetized with 1% lidocaine. An 18 gauge needle was directed into the fluid collection and thick yellow fluid was aspirated. A stiff Amplatz wire was placed. The tract was dilated and a 10 [place] multipurpose drain was a advanced into the collection. Approximately 30 mL of thick yellow fluid was removed. The catheter was sutured to the skin and attached to a suction bulb. A sterile dressing was placed over the catheter site.  COMPLICATIONS: None  FINDINGS: Fluid collection along the right lateral abdomen and perihepatic space. Again noted is diffuse thickening in the anterior abdomen and omentum with scattered pockets of gas. Again noted is a fluid collection in the left upper quadrant.  IMPRESSION: CT-guided drainage of the right lateral abdominal fluid collection.   Electronically Signed   By: Richarda Overlie M.D.   On: 09/26/2013 18:09     Assessment/Plan: Multiple abd abscesses  Cx 2-1 Candida sp  C diff  Perforated gastric ulcer, repair (08-28-13)   Cx from 2-6 abscess aspiration is no growth so far.  Await ID and sensi of candida sp. From 2-1 WBC continues to improve  Drain out 25 yesterday  Repeat CT at  some point, when drain output decreases further  Total days of antibiotics:  Day 4 piperacillin tazobactam   1/8-13  Day 18 metronidazole  Day 2 Micafungin   1/9-20  fluconazole 1/31-2/6  IV vancomycin 1/8-30  Imipenem 1/14-22           Johny Sax Infectious Diseases (pager) [phone] [email or web address] 09/28/2013, 11:22 AM  LOS: 31 days

## 2013-09-28 NOTE — Progress Notes (Signed)
31 Days Post-Op  Subjective: Rt lat abd drain placed 2/6 Doing better daily- up in chair  Objective: Vital signs in last 24 hours: Temp:  [98.4 F (36.9 C)-99 F (37.2 C)] 98.5 F (36.9 C) (02/08 0602) Pulse Rate:  [114-122] 122 (02/08 0602) Resp:  [18] 18 (02/07 2241) BP: (124-133)/(84-88) 127/84 mmHg (02/08 0602) SpO2:  [97 %-99 %] 97 % (02/08 0602) Last BM Date: 09/25/13  Intake/Output from previous day: 02/07 0701 - 02/08 0700 In: 2880 [I.V.:328; NG/GT:100; IV Piggyback:200; ZOX:0960] Out: 2115 [Urine:1940; Emesis/NG output:150; Drains:25] Intake/Output this shift:    PE: afeb; VSS Output 10 cc 2/7 10 cc on JP- clearer today- still sl yellow Site clean and dry Cx no growth so far  Lab Results:   Recent Labs  09/26/13 0830 09/27/13 0453  WBC 14.6* 11.1*  HGB 11.5* 10.6*  HCT 34.5* 32.2*  PLT 269 275   BMET  Recent Labs  09/26/13 0600  NA 135*  K 4.6  CL 103  CO2 20  GLUCOSE 130*  BUN 44*  CREATININE 1.21  CALCIUM 8.0*   PT/INR No results found for this basename: LABPROT, INR,  in the last 72 hours ABG No results found for this basename: PHART, PCO2, PO2, HCO3,  in the last 72 hours  Studies/Results: Ct Image Guided Drainage By Percutaneous Catheter  09/26/2013   CLINICAL DATA:  History of gastric perforation and intra-abdominal fluid collections.  EXAM: CT GUIDED DRAINAGE OF RIGHT LATERAL ABDOMINAL FLUID COLLECTION  ANESTHESIA/SEDATION: 1.0 mg IV Versed 50 mcg IV Fentanyl. A radiology nurse monitored the patient for moderate sedation.  Total Moderate Sedation Time:  9 minutes  PROCEDURE: Informed consent was obtained for a CT-guided drainage. Patient was placed supine on the CT scanner. Images through the upper abdomen were obtained. The right side of the abdomen was prepped and draped in sterile fashion. The skin was anesthetized with 1% lidocaine. An 18 gauge needle was directed into the fluid collection and thick yellow fluid was aspirated. A stiff  Amplatz wire was placed. The tract was dilated and a 10 [place] multipurpose drain was a advanced into the collection. Approximately 30 mL of thick yellow fluid was removed. The catheter was sutured to the skin and attached to a suction bulb. A sterile dressing was placed over the catheter site.  COMPLICATIONS: None  FINDINGS: Fluid collection along the right lateral abdomen and perihepatic space. Again noted is diffuse thickening in the anterior abdomen and omentum with scattered pockets of gas. Again noted is a fluid collection in the left upper quadrant.  IMPRESSION: CT-guided drainage of the right lateral abdominal fluid collection.   Electronically Signed   By: Richarda Overlie M.D.   On: 09/26/2013 18:09    Anti-infectives: Anti-infectives   Start     Dose/Rate Route Frequency Ordered Stop   09/26/13 1800  vancomycin (VANCOCIN) 50 mg/mL oral solution 250 mg  Status:  Discontinued     250 mg Oral 4 times per day 09/26/13 1510 09/26/13 1554   09/26/13 1600  metroNIDAZOLE (FLAGYL) IVPB 500 mg     500 mg 100 mL/hr over 60 Minutes Intravenous Every 6 hours 09/26/13 1556     09/26/13 1515  micafungin (MYCAMINE) 100 mg in sodium chloride 0.9 % 100 mL IVPB     100 mg 100 mL/hr over 1 Hours Intravenous Daily 09/26/13 1510     09/24/13 0930  piperacillin-tazobactam (ZOSYN) IVPB 3.375 g     3.375 g 12.5 mL/hr over 240 Minutes  Intravenous 3 times per day 09/24/13 0832     09/22/13 1400  metroNIDAZOLE (FLAGYL) IVPB 500 mg  Status:  Discontinued     500 mg 100 mL/hr over 60 Minutes Intravenous Every 8 hours 09/22/13 1119 09/26/13 1510   09/20/13 1230  fluconazole (DIFLUCAN) IVPB 200 mg  Status:  Discontinued     200 mg 100 mL/hr over 60 Minutes Intravenous Every 24 hours 09/20/13 1154 09/26/13 1510   09/20/13 1200  fluconazole (DIFLUCAN) IVPB 100 mg  Status:  Discontinued     100 mg 50 mL/hr over 60 Minutes Intravenous Every 24 hours 09/20/13 1151 09/20/13 1154   09/10/13 2200  metroNIDAZOLE (FLAGYL) IVPB  500 mg  Status:  Discontinued     500 mg 100 mL/hr over 60 Minutes Intravenous 3 times per day 09/10/13 1802 09/22/13 1118   09/10/13 2000  vancomycin (VANCOCIN) 50 mg/mL oral solution 500 mg  Status:  Discontinued     500 mg Oral 4 times per day 09/10/13 1802 09/21/13 1151   09/10/13 1800  vancomycin (VANCOCIN) 50 mg/mL oral solution 500 mg  Status:  Discontinued     500 mg Oral 4 times per day 09/10/13 1432 09/10/13 1802   09/10/13 1600  metroNIDAZOLE (FLAGYL) IVPB 500 mg  Status:  Discontinued     500 mg 100 mL/hr over 60 Minutes Intravenous 3 times per day 09/10/13 1432 09/10/13 1802   09/09/13 1602  vancomycin (VANCOCIN) 1,250 mg in sodium chloride 0.9 % 250 mL IVPB  Status:  Discontinued     1,250 mg 166.7 mL/hr over 90 Minutes Intravenous Every 24 hours 09/09/13 1057 09/10/13 1432   09/06/13 1600  imipenem-cilastatin (PRIMAXIN) 500 mg in sodium chloride 0.9 % 100 mL IVPB  Status:  Discontinued     500 mg 200 mL/hr over 30 Minutes Intravenous 3 times per day 09/06/13 1310 09/12/13 1048   09/03/13 1100  imipenem-cilastatin (PRIMAXIN) 500 mg in sodium chloride 0.9 % 100 mL IVPB  Status:  Discontinued     500 mg 200 mL/hr over 30 Minutes Intravenous Every 6 hours 09/03/13 1007 09/06/13 1310   09/02/13 1300  vancomycin (VANCOCIN) IVPB 1000 mg/200 mL premix  Status:  Discontinued     1,000 mg 200 mL/hr over 60 Minutes Intravenous Every 12 hours 09/01/13 1527 09/07/13 1340   08/30/13 2000  vancomycin (VANCOCIN) IVPB 750 mg/150 ml premix  Status:  Discontinued     750 mg 150 mL/hr over 60 Minutes Intravenous Every 8 hours 08/30/13 1356 09/01/13 1527   08/29/13 2200  micafungin (MYCAMINE) 100 mg in sodium chloride 0.9 % 100 mL IVPB  Status:  Discontinued     100 mg 100 mL/hr over 1 Hours Intravenous Every 24 hours 08/29/13 2036 09/10/13 1432   08/29/13 0400  vancomycin (VANCOCIN) IVPB 1000 mg/200 mL premix  Status:  Discontinued     1,000 mg 200 mL/hr over 60 Minutes Intravenous Every 8  hours 08/28/13 2358 08/30/13 1356   08/29/13 0400  piperacillin-tazobactam (ZOSYN) IVPB 3.375 g  Status:  Discontinued     3.375 g 12.5 mL/hr over 240 Minutes Intravenous 3 times per day 08/28/13 2358 09/03/13 1002   08/28/13 1845  [MAR Hold]  vancomycin (VANCOCIN) IVPB 1000 mg/200 mL premix     (On MAR Hold since 08/28/13 1957)   1,000 mg 200 mL/hr over 60 Minutes Intravenous  Once 08/28/13 1831 08/28/13 1955   08/28/13 1845  [MAR Hold]  piperacillin-tazobactam (ZOSYN) IVPB 3.375 g     (  On MAR Hold since 08/28/13 1957)   3.375 g 100 mL/hr over 30 Minutes Intravenous  Once 08/28/13 1831 08/28/13 2056      Assessment/Plan: s/p Procedure(s): EXPLORATORY LAPAROTOMY,debridment of nacrotic stomach,and primary repair of perforated stomach. (N/A)   Rt abd drain intact Follow Plan [facility]   LOS: 31 days    Stepheny Canal A 09/28/2013

## 2013-09-29 ENCOUNTER — Encounter (HOSPITAL_COMMUNITY): Payer: Self-pay | Admitting: General Practice

## 2013-09-29 LAB — DIFFERENTIAL
BASOS PCT: 1 % (ref 0–1)
Basophils Absolute: 0.1 10*3/uL (ref 0.0–0.1)
Eosinophils Absolute: 0.1 10*3/uL (ref 0.0–0.7)
Eosinophils Relative: 1 % (ref 0–5)
Lymphocytes Relative: 26 % (ref 12–46)
Lymphs Abs: 2.5 10*3/uL (ref 0.7–4.0)
MONO ABS: 0.9 10*3/uL (ref 0.1–1.0)
Monocytes Relative: 9 % (ref 3–12)
NEUTROS ABS: 6.1 10*3/uL (ref 1.7–7.7)
NEUTROS PCT: 63 % (ref 43–77)

## 2013-09-29 LAB — COMPREHENSIVE METABOLIC PANEL
ALBUMIN: 2.2 g/dL — AB (ref 3.5–5.2)
ALK PHOS: 140 U/L — AB (ref 39–117)
ALT: 18 U/L (ref 0–53)
AST: 17 U/L (ref 0–37)
BILIRUBIN TOTAL: 0.3 mg/dL (ref 0.3–1.2)
BUN: 38 mg/dL — AB (ref 6–23)
CHLORIDE: 103 meq/L (ref 96–112)
CO2: 21 mEq/L (ref 19–32)
Calcium: 7.7 mg/dL — ABNORMAL LOW (ref 8.4–10.5)
Creatinine, Ser: 1.11 mg/dL (ref 0.50–1.35)
GFR calc Af Amer: 88 mL/min — ABNORMAL LOW (ref >90)
GFR calc non Af Amer: 76 mL/min — ABNORMAL LOW (ref >90)
Glucose, Bld: 130 mg/dL — ABNORMAL HIGH (ref 70–99)
POTASSIUM: 4.3 meq/L (ref 3.7–5.3)
Sodium: 135 mEq/L — ABNORMAL LOW (ref 137–147)
Total Protein: 6.8 g/dL (ref 6.0–8.3)

## 2013-09-29 LAB — CBC
HEMATOCRIT: 35.5 % — AB (ref 39.0–52.0)
HEMOGLOBIN: 10.2 g/dL — AB (ref 13.0–17.0)
MCH: 30.4 pg (ref 26.0–34.0)
MCHC: 28.7 g/dL — ABNORMAL LOW (ref 30.0–36.0)
MCV: 106 fL — AB (ref 78.0–100.0)
Platelets: 209 10*3/uL (ref 150–400)
RBC: 3.35 MIL/uL — ABNORMAL LOW (ref 4.22–5.81)
RDW: 15.7 % — ABNORMAL HIGH (ref 11.5–15.5)
WBC: 9.7 10*3/uL (ref 4.0–10.5)

## 2013-09-29 LAB — GLUCOSE, CAPILLARY
GLUCOSE-CAPILLARY: 159 mg/dL — AB (ref 70–99)
GLUCOSE-CAPILLARY: 170 mg/dL — AB (ref 70–99)
Glucose-Capillary: 148 mg/dL — ABNORMAL HIGH (ref 70–99)
Glucose-Capillary: 80 mg/dL (ref 70–99)
Glucose-Capillary: 103 mg/dL — ABNORMAL HIGH (ref 70–99)
Glucose-Capillary: 103 mg/dL — ABNORMAL HIGH (ref 70–99)

## 2013-09-29 LAB — HIV ANTIBODY (ROUTINE TESTING W REFLEX): HIV: NONREACTIVE

## 2013-09-29 LAB — PREALBUMIN: Prealbumin: 21.3 mg/dL (ref 17.0–34.0)

## 2013-09-29 LAB — TRIGLYCERIDES: Triglycerides: 214 mg/dL — ABNORMAL HIGH (ref <150)

## 2013-09-29 LAB — CULTURE, ROUTINE-ABSCESS: CULTURE: NO GROWTH

## 2013-09-29 LAB — PHOSPHORUS: Phosphorus: 4 mg/dL (ref 2.3–4.6)

## 2013-09-29 LAB — MAGNESIUM: MAGNESIUM: 1.9 mg/dL (ref 1.5–2.5)

## 2013-09-29 MED ORDER — INSULIN ASPART 100 UNIT/ML ~~LOC~~ SOLN: 0.0000 [IU] | SUBCUTANEOUS | Status: DC

## 2013-09-29 MED ORDER — TRACE MINERALS CR-CU-F-FE-I-MN-MO-SE-ZN IV SOLN
INTRAVENOUS | Status: AC
  Administered 2013-09-29: 17:00:00 via INTRAVENOUS
  Filled 2013-09-29: qty 2000

## 2013-09-29 MED ORDER — FAT EMULSION 20 % IV EMUL
240.0000 mL | INTRAVENOUS | Status: AC
  Administered 2013-09-29: 240 mL via INTRAVENOUS
  Filled 2013-09-29 (×2): qty 250

## 2013-09-29 MED ORDER — SODIUM CHLORIDE 0.45 % IV SOLN
INTRAVENOUS | Status: DC
  Administered 2013-09-30 – 2013-10-05 (×4): via INTRAVENOUS
  Administered 2013-10-07 – 2013-10-08 (×2): 20 mL/h via INTRAVENOUS
  Administered 2013-10-09 – 2013-10-11 (×3): via INTRAVENOUS
  Administered 2013-10-14: 20 mL/h via INTRAVENOUS

## 2013-09-29 MED ORDER — INSULIN ASPART 100 UNIT/ML ~~LOC~~ SOLN
0.0000 [IU] | SUBCUTANEOUS | Status: DC
  Administered 2013-09-29 – 2013-09-30 (×4): 3 [IU] via SUBCUTANEOUS
  Administered 2013-10-02 (×2): 2 [IU] via SUBCUTANEOUS
  Administered 2013-10-03 – 2013-10-05 (×4): 3 [IU] via SUBCUTANEOUS
  Administered 2013-10-05 – 2013-10-06 (×2): 2 [IU] via SUBCUTANEOUS
  Administered 2013-10-06: 3 [IU] via SUBCUTANEOUS
  Administered 2013-10-07 (×2): 2 [IU] via SUBCUTANEOUS
  Administered 2013-10-08: 3 [IU] via SUBCUTANEOUS
  Administered 2013-10-08 (×3): 2 [IU] via SUBCUTANEOUS
  Administered 2013-10-09 – 2013-10-13 (×7): 3 [IU] via SUBCUTANEOUS
  Administered 2013-10-13: 2 [IU] via SUBCUTANEOUS
  Administered 2013-10-14: 3 [IU] via SUBCUTANEOUS

## 2013-09-29 NOTE — Progress Notes (Signed)
Patient ID: James Mccormick, male   DOB: 1966/06/29, 48 y.o.   MRN: [id number]         [facility]    Date of Admission:  08/28/2013    Total days of antibiotics 32        Day 20 metronidazole        Day 6 piperacillin tazobactam        Day 3 micafungin Principal Problem:   Intra-abdominal abscess Active Problems:   Perforated gastric ulcer   ARDS (adult respiratory distress syndrome)   Hypocalcemia   Hypophosphatemia   Protein-calorie malnutrition, moderate   Atrial fibrillation   Pleural effusion   C. difficile colitis   Depression   Cigarette smoker   . [START ON 09/30/2013] sodium chloride   Intravenous Q24H  . antiseptic oral rinse  15 mL Mouth Rinse q12n4p  . chlorhexidine  15 mL Mouth Rinse BID  . enoxaparin (LOVENOX) injection  40 mg Subcutaneous Q24H  . insulin aspart  0-15 Units Subcutaneous Custom  . metoprolol  5 mg Intravenous Q6H  . metronidazole  500 mg Intravenous Q6H  . micafungin (MYCAMINE) IV  100 mg Intravenous Daily  . pantoprazole (PROTONIX) IV  40 mg Intravenous Q12H  . piperacillin-tazobactam (ZOSYN)  IV  3.375 g Intravenous Q8H  . sodium chloride  10-40 mL Intracatheter Q12H    Subjective: He states that he is feeling better. He is having minimal abdominal pain. He has had 2 semisoft bowel movements today.   Past Medical History  Diagnosis Date  . GERD (gastroesophageal reflux disease)   . Hypertension     History  Substance Use Topics  . Smoking status: Current Every Day Smoker -- 1.00 packs/day    Types: Cigarettes  . Smokeless tobacco: Not on file  . Alcohol Use: No    Family History  Problem Relation Age of Onset  . Mental illness Mother   . Hypertension Sister   . Hypertension Brother     No Known Allergies  Objective: Temp:  [98.8 F (37.1 C)-99.6 F (37.6 C)] 98.8 F (37.1 C) (02/09 0612) Pulse Rate:  [115-117] 116 (02/09 0612) Resp:  [18-20] 19 (02/09 0612) BP: (125-131)/(82-91) 125/82  mmHg (02/09 0612) SpO2:  [97 %-98 %] 98 % (02/09 0612)  General: He is alert and in no distress Abdomen: Nontender. A total of 35 cc of fluid out of his 3 abdominal drainage yesterday. There is probably about that amount in the drains currently.  Lab Results Lab Results  Component Value Date   WBC 9.7 09/29/2013   HGB 10.2* 09/29/2013   HCT 35.5* 09/29/2013   MCV 106.0* 09/29/2013   PLT 209 09/29/2013    Lab Results  Component Value Date   CREATININE 1.11 09/29/2013   BUN 38* 09/29/2013   NA 135* 09/29/2013   K 4.3 09/29/2013   CL 103 09/29/2013   CO2 21 09/29/2013    Lab Results  Component Value Date   ALT 18 09/29/2013   AST 17 09/29/2013   ALKPHOS 140* 09/29/2013   BILITOT 0.3 09/29/2013      Microbiology: Recent Results (from the past 240 hour(s))  CULTURE, ROUTINE-ABSCESS     Status: None   Collection Time    09/21/13 11:40 AM      Result Value Range Status   Specimen Description PERITONEAL CAVITY   Final   Special Requests Normal   Final   Gram Stain     Final   Value:  ABUNDANT WBC PRESENT, PREDOMINANTLY PMN     NO SQUAMOUS EPITHELIAL CELLS SEEN     NO ORGANISMS SEEN     [facility]   Culture     Final   Value: FEW YEAST ISOLATED;ID TO FOLLOW     YEAST CONSISTENT WITH CANDIDA SPECIES     [facility]formed at [facility]   Report Status PENDING   Incomplete  CULTURE, ROUTINE-ABSCESS     Status: None   Collection Time    09/26/13  4:02 PM      Result Value Range Status   Specimen Description ABSCESS PERITONEAL CAVITY   Final   Special Requests RIGHT LATERAL ABOMINAL   Final   Gram Stain PENDING   Incomplete   Culture     Final   Value: NO GROWTH 2 DAYS     [facility]formed at [facility]   Report Status PENDING   Incomplete    Assessment: Unfortunately the stat Gram stain ordered on the abscess fluid 3 days ago is still pending and I am still waiting on speciation of the Candida the isolated on February 1. I will continue current antibiotics but will  consider changing piperacillin tazobactam to IV cefepime soon depending on stain and culture results. This would continue to offer broad coverage for his severe intra-abdominal abscesses while possibly lessening pressure on normal gut flora and making it more difficult to control his C. difficile colitis.  Plan: 1. Continue current antimicrobial therapy 2. Await final results of stains and cultures  Cliffton AstersJohn Rilen Shukla, MD [facility] [place] [facility] [facility] [phone] pager   [phone] cell 09/29/2013, 1:34 PM

## 2013-09-29 NOTE — Progress Notes (Addendum)
PARENTERAL NUTRITION CONSULT NOTE - FOLLOW UP  Pharmacy Consult for TPN Indication: Prolonged ileus  No Known Allergies  Patient Measurements: Height: '5\' 11"'  (180.3 cm) Weight: 203 lb 7.8 oz (92.3 kg) IBW/kg (Calculated) : 75.3 Vital Signs: Temp: 98.8 F (37.1 C) (02/09 0612) Temp src: Oral (02/08 2207) BP: 125/82 mmHg (02/09 0612) Pulse Rate: 116 (02/09 0612) Intake/Output from previous day: 02/08 0701 - 02/09 0700 In: 3011 [I.V.:320; NG/GT:50; IV Piggyback:300; TPN:2336] Out: 4383 [Urine:2250; Emesis/NG output:750; Drains:35] Intake/Output from this shift: Total I/O In: -  Out: 500 [Urine:400; Emesis/NG output:100]  Labs:  Recent Labs  09/27/13 0453 09/29/13 0618  WBC 11.1* 9.7  HGB 10.6* 10.2*  HCT 32.2* 35.5*  PLT 275 209    Recent Labs  09/29/13 0618  NA 135*  K 4.3  CL 103  CO2 21  GLUCOSE 130*  BUN 38*  CREATININE 1.11  CALCIUM 7.7*  MG 1.9  PHOS 4.0  PROT 6.8  ALBUMIN 2.2*  AST 17  ALT 18  ALKPHOS 140*  BILITOT 0.3  TRIG 214*   Estimated Creatinine Clearance: 93.5 ml/min (by C-G formula based on Cr of 1.11).    Recent Labs  09/29/13 0021 09/29/13 0606 09/29/13 0759  GLUCAP 170* 148* 80   Insulin Requirements in the past 24 hours:  40 units insulin in TPN + 5 units from SSI during 12 hr cycle  Current Nutrition:  Clinimix E 5/15 2400 mL over 12 hours + lipid emulsion 20% at 240 mL over 12 hr- provides 120 gm protein and 2352 kcal which is 92% protein goals and 100% kcal goals NPO  Nutritional Goals:  Goal 2250-2450 kcal and 130-140 gm protein per RD 09/23/13  Assessment:  48 y.o. male presented to ED 1/8 with abd pain. CT consistent with perforated stomach ulcer due to NSAID use, pneumoperitoneum, peritonitis. Taken emergently to OR where he underwent exp lap, debridement of necrotic stomach, repair of perforated stomach. Contrast study showed no leak from stomach or small bowel.   GI:  CT scan 1/31 poss persistent gastric perf,  fluid collection 4.5x4.9, and 3rd collection between bladder and rectum. 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 Will require NPO/NG/TNA for a long time- TPN has been transitioned to cyclic over 12 hours; wil abdominal drain- minimal output  Endo: No h/o DM. CBGs 80-130 during customized CBG checks, also has 40 units of insulin in 2433m bag.   Lytes: Na 135, K 4.3, Mag 1.9, Phso 4, CorCa ~9.1  Heptobili: prealbumin up to 19.3 from 10.7, moving in the right direction  Renal: Scr 1.11, est CrCl ~934mmin; UOP 36m35mg/hr in last 24 hours  ID: WBC 9.7 Vanc 1/8>>1/21 Zosyn 1/8>>1/14 Micafungin 1/9>>1/21; 2/6>> Primaxin 1/14>>1/23  Vanc PO 1/21>>1/31 Flagyl 1/21>> Zosyn 2/4>> Fluconazole 1/31>>2/6  Cultures: 2/6 peritoneal abscess: 2/1 peritoneal cavity:candida species 1/30 urine: Negative 1/21 CDiff Positive 1/16 Trach aspirate: negative 1/9 Blood cultures: negative  Hepatobil: Alk phos of 140 (increase), albumin 2.2; other LFTs wnl. Trigs increased to 214. Prealbumin trended up to 19.3 on 2/2- a repeat is pending  TPN Access: triple lumen PICC 1/20; replaced 1/27  TPN day#: 31  Plan:  1. Continue Clinimix E 5/15 + full trace elements and multivitamins + 40 units of regular insulin with 20%81%pids at cyclic rate- 50 ml/hr x 1 hr, then run at 230 ml/hr x 10 hrs, then run at 50 ml/hr x1 hr for 12 hours total.  2. Continue 1/2NS at 61m53m ONLY when TPN is not  running (0600-1800) 3. CBG checks and moderate SSI correction at 0600/1200/2000/2400 4. Other TPN labs as ordered  Redford Behrle D. Jaecion Dempster, PharmD, BCPS Clinical Pharmacist Pager: [phone] 09/29/2013 9:57 AM

## 2013-09-29 NOTE — Progress Notes (Signed)
Physical Therapy Treatment Patient Details Name: James LeydenChristopher D Mccormick MRN: [id number] DOB: 01/01/1966 Today's Date: 09/29/2013 Time: [id number] PT Time Calculation (min): 26 min  PT Assessment / Plan / Recommendation  History of Present Illness 48 yo male smoker presented with abdominal pain from perforated gastric ulcer in setting of NSAID.  PCCM consulted to assist with septic shock and respiratory failure management.  Reintubated  1/24 and reextubated 1/26.   PT Comments   Patient mobilizing very well, ambulating without any assistance. At this time feel patient has progress to a supervision level. Main issue to be addressed is endurance, which this therapist feels will continue to progress as patient increases activity levels. Will continue to see acutely recommending dc home with HHPT.    Follow Up Recommendations  Home health PT           Equipment Recommendations  Rolling walker with 5" wheels       Frequency Min 3X/week   Progress towards PT Goals Progress towards PT goals: Progressing toward goals  Plan Discharge plan needs to be updated    Precautions / Restrictions Precautions Precautions: Fall Precaution Comments: watch HR (tachy) Required Braces or Orthoses: Other Brace/Splint (abdominal binder) Other Brace/Splint: abdominal binder Restrictions Weight Bearing Restrictions: No Other Position/Activity Restrictions: NG tube   Pertinent Vitals/Pain 5/10     Mobility  Bed Mobility Overal bed mobility: Needs Assistance Bed Mobility: Supine to Sit;Sit to Supine Supine to sit: Supervision;HOB elevated Sit to supine: Supervision General bed mobility comments: No physical assist needed to come to EOB Transfers Overall transfer level: Needs assistance Equipment used: Rolling walker (2 wheeled) Transfers: Sit to/from Stand Sit to Stand: Supervision General transfer comment: assist to come forward and vc's for Golda placement Ambulation/Gait Ambulation/Gait assistance:  Supervision Ambulation Distance (Feet): 340 Feet Assistive device: Rolling walker (2 wheeled) Gait Pattern/deviations: Step-through pattern Gait velocity: slow Gait velocity interpretation: Below normal speed for age/gender General Gait Details: patient with some decreased endurance but no physical assist needed      PT Goals (current goals can now be found in the care plan section) Acute Rehab PT Goals Patient Stated Goal: Need to get back to work PT Goal Formulation: With patient Time For Goal Achievement: 10/08/13 Potential to Achieve Goals: Good  Visit Information  Last PT Received On: 09/29/13 Assistance Needed: +1 History of Present Illness: 48 yo male smoker presented with abdominal pain from perforated gastric ulcer in setting of NSAID.  PCCM consulted to assist with septic shock and respiratory failure management.  Reintubated  1/24 and reextubated 1/26.    Subjective Data  Subjective: rather flat affect today Patient Stated Goal: Need to get back to work   Cognition  Cognition Arousal/Alertness: Awake/alert Behavior During Therapy: WFL for tasks assessed/performed Overall Cognitive Status: Within Functional Limits for tasks assessed    Balance  Balance Sitting balance-Leahy Scale: Good Standing balance-Leahy Scale: Good  End of Session PT - End of Session Equipment Utilized During Treatment: Gait belt Activity Tolerance: Patient limited by fatigue Patient left: in bed;with call bell/phone within reach;with family/visitor present Nurse Communication: Mobility status   GP     James AsaWerner, James Mccormick 09/29/2013, 4:39 PM James Crumbevon Zetha Mccormick, PT DPT  [phone]

## 2013-09-29 NOTE — Progress Notes (Signed)
Patient ID: James Mccormick, male   DOB: October 01, 1965, 48 y.o.   MRN: [id number]  Subjective: No changes.  White count normal.  Remains tachycardic.  Afebrile.    Objective:  Vital signs:  Filed Vitals:   09/28/13 1100 09/28/13 1415 09/28/13 2207 09/29/13 0612  BP:  129/91 131/84 125/82  Pulse:  117 115 116  Temp:  99.1 F (37.3 C) 99.6 F (37.6 C) 98.8 F (37.1 C)  TempSrc:  Oral Oral   Resp:  '18 20 19  ' Height:      Weight: 203 lb 7.8 oz (92.3 kg)     SpO2:  98% 97% 98%    Last BM Date: 09/25/13  Intake/Output   Yesterday:  02/08 0701 - 02/09 0700 In: 3011 [I.V.:320; NG/GT:50; IV Piggyback:300; JHE:1740] Out: 8144 [Urine:2250; Emesis/NG output:750; Drains:35]     Physical Exam:  General: Pt awake/alert/oriented and in no acute distress  Chest:CTA.  No chest wall pain w good excursion  CV: Pulses intact. Regular rhythm. tachycardic  Bowel sounds are present. Non tender. Bilateral drains with purulent/milky output. Midline wound is c/d/i Ext: SCDs BLE. No mjr edema. No cyanosis  Skin: No petechiae / purpura   Problem List:   Principal Problem:   Intra-abdominal abscess Active Problems:   Perforated gastric ulcer   ARDS (adult respiratory distress syndrome)   Hypocalcemia   Hypophosphatemia   Protein-calorie malnutrition, moderate   Atrial fibrillation   Pleural effusion   C. difficile colitis   Depression   Cigarette smoker    Results:   Labs: Results for orders placed during the hospital encounter of 08/28/13 (from the past 48 hour(s))  GLUCOSE, CAPILLARY     Status: Abnormal   Collection Time    09/27/13  2:20 PM      Result Value Range   Glucose-Capillary 105 (*) 70 - 99 mg/dL  GLUCOSE, CAPILLARY     Status: None   Collection Time    09/27/13  5:44 PM      Result Value Range   Glucose-Capillary 96  70 - 99 mg/dL   Comment 1 Notify RN    GLUCOSE, CAPILLARY     Status: Abnormal   Collection Time    09/27/13 11:54 PM      Result Value Range    Glucose-Capillary 149 (*) 70 - 99 mg/dL  GLUCOSE, CAPILLARY     Status: Abnormal   Collection Time    09/28/13  6:34 AM      Result Value Range   Glucose-Capillary 123 (*) 70 - 99 mg/dL  GLUCOSE, CAPILLARY     Status: Abnormal   Collection Time    09/28/13 12:17 PM      Result Value Range   Glucose-Capillary 114 (*) 70 - 99 mg/dL   Comment 1 Notify RN    HIV ANTIBODY (ROUTINE TESTING)     Status: None   Collection Time    09/28/13  4:50 PM      Result Value Range   HIV NON REACTIVE  NON REACTIVE   Comment: Performed at [facility], CAPILLARY     Status: None   Collection Time    09/28/13  5:27 PM      Result Value Range   Glucose-Capillary 89  70 - 99 mg/dL   Comment 1 Notify RN    GLUCOSE, CAPILLARY     Status: Abnormal   Collection Time    09/29/13 12:21 AM      Result Value  Range   Glucose-Capillary 170 (*) 70 - 99 mg/dL   Comment 1 Notify RN     Comment 2 Documented in Chart    GLUCOSE, CAPILLARY     Status: Abnormal   Collection Time    09/29/13  6:06 AM      Result Value Range   Glucose-Capillary 148 (*) 70 - 99 mg/dL   Comment 1 Notify RN     Comment 2 Documented in Chart    COMPREHENSIVE METABOLIC PANEL     Status: Abnormal   Collection Time    09/29/13  6:18 AM      Result Value Range   Sodium 135 (*) 137 - 147 mEq/L   Potassium 4.3  3.7 - 5.3 mEq/L   Chloride 103  96 - 112 mEq/L   CO2 21  19 - 32 mEq/L   Glucose, Bld 130 (*) 70 - 99 mg/dL   BUN 38 (*) 6 - 23 mg/dL   Creatinine, Ser 1.11  0.50 - 1.35 mg/dL   Calcium 7.7 (*) 8.4 - 10.5 mg/dL   Total Protein 6.8  6.0 - 8.3 g/dL   Albumin 2.2 (*) 3.5 - 5.2 g/dL   AST 17  0 - 37 U/L   ALT 18  0 - 53 U/L   Alkaline Phosphatase 140 (*) 39 - 117 U/L   Total Bilirubin 0.3  0.3 - 1.2 mg/dL   GFR calc non Af Amer 76 (*) >90 mL/min   GFR calc Af Amer 88 (*) >90 mL/min   Comment: (NOTE)     The eGFR has been calculated using the CKD EPI equation.     This calculation has not been validated  in all clinical situations.     eGFR's persistently <90 mL/min signify possible Chronic Kidney     Disease.  MAGNESIUM     Status: None   Collection Time    09/29/13  6:18 AM      Result Value Range   Magnesium 1.9  1.5 - 2.5 mg/dL  PHOSPHORUS     Status: None   Collection Time    09/29/13  6:18 AM      Result Value Range   Phosphorus 4.0  2.3 - 4.6 mg/dL  CBC     Status: Abnormal   Collection Time    09/29/13  6:18 AM      Result Value Range   WBC 9.7  4.0 - 10.5 K/uL   RBC 3.35 (*) 4.22 - 5.81 MIL/uL   Hemoglobin 10.2 (*) 13.0 - 17.0 g/dL   HCT 35.5 (*) 39.0 - 52.0 %   MCV 106.0 (*) 78.0 - 100.0 fL   MCH 30.4  26.0 - 34.0 pg   MCHC 28.7 (*) 30.0 - 36.0 g/dL   RDW 15.7 (*) 11.5 - 15.5 %   Platelets 209  150 - 400 K/uL  DIFFERENTIAL     Status: None   Collection Time    09/29/13  6:18 AM      Result Value Range   Neutrophils Relative % 63  43 - 77 %   Lymphocytes Relative 26  12 - 46 %   Monocytes Relative 9  3 - 12 %   Eosinophils Relative 1  0 - 5 %   Basophils Relative 1  0 - 1 %   Neutro Abs 6.1  1.7 - 7.7 K/uL   Lymphs Abs 2.5  0.7 - 4.0 K/uL   Monocytes Absolute 0.9  0.1 - 1.0 K/uL  Eosinophils Absolute 0.1  0.0 - 0.7 K/uL   Basophils Absolute 0.1  0.0 - 0.1 K/uL  TRIGLYCERIDES     Status: Abnormal   Collection Time    09/29/13  6:18 AM      Result Value Range   Triglycerides 214 (*) <150 mg/dL  GLUCOSE, CAPILLARY     Status: None   Collection Time    09/29/13  7:59 AM      Result Value Range   Glucose-Capillary 80  70 - 99 mg/dL   Comment 1 Notify RN      Imaging / Studies: No results found.  Medications / Allergies: per chart  Antibiotics: Anti-infectives   Start     Dose/Rate Route Frequency Ordered Stop   09/26/13 1800  vancomycin (VANCOCIN) 50 mg/mL oral solution 250 mg  Status:  Discontinued     250 mg Oral 4 times per day 09/26/13 1510 09/26/13 1554   09/26/13 1600  metroNIDAZOLE (FLAGYL) IVPB 500 mg     500 mg 100 mL/hr over 60 Minutes  Intravenous Every 6 hours 09/26/13 1556     09/26/13 1515  micafungin (MYCAMINE) 100 mg in sodium chloride 0.9 % 100 mL IVPB     100 mg 100 mL/hr over 1 Hours Intravenous Daily 09/26/13 1510     09/24/13 0930  piperacillin-tazobactam (ZOSYN) IVPB 3.375 g     3.375 g 12.5 mL/hr over 240 Minutes Intravenous 3 times per day 09/24/13 0832     09/22/13 1400  metroNIDAZOLE (FLAGYL) IVPB 500 mg  Status:  Discontinued     500 mg 100 mL/hr over 60 Minutes Intravenous Every 8 hours 09/22/13 1119 09/26/13 1510   09/20/13 1230  fluconazole (DIFLUCAN) IVPB 200 mg  Status:  Discontinued     200 mg 100 mL/hr over 60 Minutes Intravenous Every 24 hours 09/20/13 1154 09/26/13 1510   09/20/13 1200  fluconazole (DIFLUCAN) IVPB 100 mg  Status:  Discontinued     100 mg 50 mL/hr over 60 Minutes Intravenous Every 24 hours 09/20/13 1151 09/20/13 1154   09/10/13 2200  metroNIDAZOLE (FLAGYL) IVPB 500 mg  Status:  Discontinued     500 mg 100 mL/hr over 60 Minutes Intravenous 3 times per day 09/10/13 1802 09/22/13 1118   09/10/13 2000  vancomycin (VANCOCIN) 50 mg/mL oral solution 500 mg  Status:  Discontinued     500 mg Oral 4 times per day 09/10/13 1802 09/21/13 1151   09/10/13 1800  vancomycin (VANCOCIN) 50 mg/mL oral solution 500 mg  Status:  Discontinued     500 mg Oral 4 times per day 09/10/13 1432 09/10/13 1802   09/10/13 1600  metroNIDAZOLE (FLAGYL) IVPB 500 mg  Status:  Discontinued     500 mg 100 mL/hr over 60 Minutes Intravenous 3 times per day 09/10/13 1432 09/10/13 1802   09/09/13 1602  vancomycin (VANCOCIN) 1,250 mg in sodium chloride 0.9 % 250 mL IVPB  Status:  Discontinued     1,250 mg 166.7 mL/hr over 90 Minutes Intravenous Every 24 hours 09/09/13 1057 09/10/13 1432   09/06/13 1600  imipenem-cilastatin (PRIMAXIN) 500 mg in sodium chloride 0.9 % 100 mL IVPB  Status:  Discontinued     500 mg 200 mL/hr over 30 Minutes Intravenous 3 times per day 09/06/13 1310 09/12/13 1048   09/03/13 1100   imipenem-cilastatin (PRIMAXIN) 500 mg in sodium chloride 0.9 % 100 mL IVPB  Status:  Discontinued     500 mg 200 mL/hr over 30 Minutes Intravenous Every  6 hours 09/03/13 1007 09/06/13 1310   09/02/13 1300  vancomycin (VANCOCIN) IVPB 1000 mg/200 mL premix  Status:  Discontinued     1,000 mg 200 mL/hr over 60 Minutes Intravenous Every 12 hours 09/01/13 1527 09/07/13 1340   08/30/13 2000  vancomycin (VANCOCIN) IVPB 750 mg/150 ml premix  Status:  Discontinued     750 mg 150 mL/hr over 60 Minutes Intravenous Every 8 hours 08/30/13 1356 09/01/13 1527   08/29/13 2200  micafungin (MYCAMINE) 100 mg in sodium chloride 0.9 % 100 mL IVPB  Status:  Discontinued     100 mg 100 mL/hr over 1 Hours Intravenous Every 24 hours 08/29/13 2036 09/10/13 1432   08/29/13 0400  vancomycin (VANCOCIN) IVPB 1000 mg/200 mL premix  Status:  Discontinued     1,000 mg 200 mL/hr over 60 Minutes Intravenous Every 8 hours 08/28/13 2358 08/30/13 1356   08/29/13 0400  piperacillin-tazobactam (ZOSYN) IVPB 3.375 g  Status:  Discontinued     3.375 g 12.5 mL/hr over 240 Minutes Intravenous 3 times per day 08/28/13 2358 09/03/13 1002   08/28/13 1845  [MAR Hold]  vancomycin (VANCOCIN) IVPB 1000 mg/200 mL premix     (On MAR Hold since 08/28/13 1957)   1,000 mg 200 mL/hr over 60 Minutes Intravenous  Once 08/28/13 1831 08/28/13 1955   08/28/13 1845  [MAR Hold]  piperacillin-tazobactam (ZOSYN) IVPB 3.375 g     (On MAR Hold since 08/28/13 1957)   3.375 g 100 mL/hr over 30 Minutes Intravenous  Once 08/28/13 1831 08/28/13 2056      Assessment/Plan  Acute respiratory failure-resolved  Hx HTN-stable. Continue with metoprolol IV.  NSTEMI-ischemia demand, stable, cards follow up  Acute systolic heart failure-echo 08/29/13 with diffuse hypokinesis with EF 40-45%, poor study, recommend repeat. Will need cardiology follow up.  Septic shock 2/2 peritonitis-resolved  AKI-resolved Acute encephalopathy-resolved  Perforated gastric ulcer s/p  exploratory laparotomy Dr. Redmond Pulling 08/28/13  POD#32  -s/p IR drain placement to RUQ 2/5--culture negative to date -white count is down, remains tachycardic.  Will consider IR drain placement to pelvic fluid collection versus repeat CT mid to late week to re-evaluate  -restarted zosyn 2/4--->.  Micafungin 2/6--->. Indication: UGI fistula, multiple intra-abdominal fluid collections.  -appreciate ID assistance -midline wound has dehisced, continue with BID wet to dry dressing changes  -Continue NGT to suction, TPN, NPO -VTE prophylaxis; SCDs, lovenox  PCM  -continue with TPN  Deconditioning-PT/OT, up to chair, ambulate.  Anemia of critical illness-stable  Hypokalemia-resolved  C. Diff colitis  -treatment completed. Continue with flagyl while on antibiotics to reduce recurrence   Erby Pian, [facility] Surgery Pager [phone] Office [phone]  09/29/2013 8:12 AM   .

## 2013-09-29 NOTE — Progress Notes (Signed)
May get F/U CT later this week if continues to improve. No abdominal pain. Had a loose BM. NT. Patient examined and I agree with the assessment and plan  Violeta GelinasBurke Eliora Nienhuis, MD, MPH, FACS Pager: [phone]  09/29/2013 11:11 AM

## 2013-09-30 DIAGNOSIS — F329 Major depressive disorder, single episode, unspecified: Secondary | ICD-10-CM

## 2013-09-30 DIAGNOSIS — F411 Generalized anxiety disorder: Secondary | ICD-10-CM

## 2013-09-30 LAB — GLUCOSE, CAPILLARY
GLUCOSE-CAPILLARY: 96 mg/dL (ref 70–99)
Glucose-Capillary: 183 mg/dL — ABNORMAL HIGH (ref 70–99)
Glucose-Capillary: 88 mg/dL (ref 70–99)
Glucose-Capillary: 159 mg/dL — ABNORMAL HIGH (ref 70–99)

## 2013-09-30 LAB — CULTURE, ROUTINE-ABSCESS: Special Requests: NORMAL

## 2013-09-30 LAB — CBC
HEMATOCRIT: 31.3 % — AB (ref 39.0–52.0)
Hemoglobin: 10.4 g/dL — ABNORMAL LOW (ref 13.0–17.0)
MCH: 29.7 pg (ref 26.0–34.0)
MCHC: 33.2 g/dL (ref 30.0–36.0)
MCV: 89.4 fL (ref 78.0–100.0)
PLATELETS: 236 10*3/uL (ref 150–400)
RBC: 3.5 MIL/uL — ABNORMAL LOW (ref 4.22–5.81)
RDW: 15 % (ref 11.5–15.5)
WBC: 10.6 10*3/uL — AB (ref 4.0–10.5)

## 2013-09-30 MED ORDER — TRACE MINERALS CR-CU-F-FE-I-MN-MO-SE-ZN IV SOLN
INTRAVENOUS | Status: AC
  Administered 2013-09-30: 17:00:00 via INTRAVENOUS
  Filled 2013-09-30: qty 2000

## 2013-09-30 MED ORDER — DEXTROSE 5 % IV SOLN
2.0000 g | Freq: Two times a day (BID) | INTRAVENOUS | Status: DC
  Administered 2013-09-30 – 2013-10-06 (×12): 2 g via INTRAVENOUS
  Filled 2013-09-30 (×14): qty 2

## 2013-09-30 MED ORDER — METOPROLOL TARTRATE 1 MG/ML IV SOLN
5.0000 mg | INTRAVENOUS | Status: DC
Start: 2013-09-30 — End: 2013-10-02
  Administered 2013-09-30 – 2013-10-02 (×11): 5 mg via INTRAVENOUS
  Filled 2013-09-30 (×15): qty 5

## 2013-09-30 MED ORDER — FAT EMULSION 20 % IV EMUL
240.0000 mL | INTRAVENOUS | Status: AC
  Administered 2013-09-30: 240 mL via INTRAVENOUS
  Filled 2013-09-30 (×2): qty 250

## 2013-09-30 MED ORDER — LORAZEPAM 2 MG/ML IJ SOLN: 1.0000 mg | Freq: Four times a day (QID) | INTRAMUSCULAR | Status: DC | PRN

## 2013-09-30 NOTE — Progress Notes (Signed)
Will see how labs are in the AM. Plan F/U CT later this week. Certainly is looking better clinically. Patient examined and I agree with the assessment and plan  Violeta GelinasBurke Torianne Laflam, MD, MPH, FACS Pager: [phone]  09/30/2013 1:21 PM

## 2013-09-30 NOTE — Progress Notes (Signed)
33 Days Post-Op  Subjective: Rt lat abd drain placed 2/6 Pt feeling better daily Up in bed Passing gas and had 2-3 BMs yesterday Still not eating yet  Objective: Vital signs in last 24 hours: Temp:  [98.1 F (36.7 C)-99.5 F (37.5 C)] 98.1 F (36.7 C) (02/10 0641) Pulse Rate:  [105-112] 105 (02/10 0641) Resp:  [20] 20 (02/10 0641) BP: (118-132)/(75-88) 118/75 mmHg (02/10 0641) SpO2:  [95 %-98 %] 95 % (02/10 0641) Weight:  [202 lb 9.6 oz (91.9 kg)] 202 lb 9.6 oz (91.9 kg) (02/10 0641) Last BM Date: 09/29/13  Intake/Output from previous day: 02/09 0701 - 02/10 0700 In: 2505 [TPN:2490] Out: 2710 [Urine:2275; Emesis/NG output:250; Drains:185] Intake/Output this shift:    PE:  99.5; now 98.1 VSS Pleasant Site of IR drain in Rt lat abd is NT; no bleeding Output milky yellow 150 cc yesterday; 30 cc in JP cx no growth so far Wbc 10.6  Lab Results:   Recent Labs  09/29/13 0618 09/30/13 0552  WBC 9.7 10.6*  HGB 10.2* 10.4*  HCT 35.5* 31.3*  PLT 209 236   BMET  Recent Labs  09/29/13 0618  NA 135*  K 4.3  CL 103  CO2 21  GLUCOSE 130*  BUN 38*  CREATININE 1.11  CALCIUM 7.7*   PT/INR No results found for this basename: LABPROT, INR,  in the last 72 hours ABG No results found for this basename: PHART, PCO2, PO2, HCO3,  in the last 72 hours  Studies/Results: No results found.  Anti-infectives: Anti-infectives   Start     Dose/Rate Route Frequency Ordered Stop   09/26/13 1800  vancomycin (VANCOCIN) 50 mg/mL oral solution 250 mg  Status:  Discontinued     250 mg Oral 4 times per day 09/26/13 1510 09/26/13 1554   09/26/13 1600  metroNIDAZOLE (FLAGYL) IVPB 500 mg     500 mg 100 mL/hr over 60 Minutes Intravenous Every 6 hours 09/26/13 1556     09/26/13 1515  micafungin (MYCAMINE) 100 mg in sodium chloride 0.9 % 100 mL IVPB     100 mg 100 mL/hr over 1 Hours Intravenous Daily 09/26/13 1510     09/24/13 0930  piperacillin-tazobactam (ZOSYN) IVPB 3.375 g     3.375 g 12.5 mL/hr over 240 Minutes Intravenous 3 times per day 09/24/13 0832     09/22/13 1400  metroNIDAZOLE (FLAGYL) IVPB 500 mg  Status:  Discontinued     500 mg 100 mL/hr over 60 Minutes Intravenous Every 8 hours 09/22/13 1119 09/26/13 1510   09/20/13 1230  fluconazole (DIFLUCAN) IVPB 200 mg  Status:  Discontinued     200 mg 100 mL/hr over 60 Minutes Intravenous Every 24 hours 09/20/13 1154 09/26/13 1510   09/20/13 1200  fluconazole (DIFLUCAN) IVPB 100 mg  Status:  Discontinued     100 mg 50 mL/hr over 60 Minutes Intravenous Every 24 hours 09/20/13 1151 09/20/13 1154   09/10/13 2200  metroNIDAZOLE (FLAGYL) IVPB 500 mg  Status:  Discontinued     500 mg 100 mL/hr over 60 Minutes Intravenous 3 times per day 09/10/13 1802 09/22/13 1118   09/10/13 2000  vancomycin (VANCOCIN) 50 mg/mL oral solution 500 mg  Status:  Discontinued     500 mg Oral 4 times per day 09/10/13 1802 09/21/13 1151   09/10/13 1800  vancomycin (VANCOCIN) 50 mg/mL oral solution 500 mg  Status:  Discontinued     500 mg Oral 4 times per day 09/10/13 1432 09/10/13 1802  09/10/13 1600  metroNIDAZOLE (FLAGYL) IVPB 500 mg  Status:  Discontinued     500 mg 100 mL/hr over 60 Minutes Intravenous 3 times per day 09/10/13 1432 09/10/13 1802   09/09/13 1602  vancomycin (VANCOCIN) 1,250 mg in sodium chloride 0.9 % 250 mL IVPB  Status:  Discontinued     1,250 mg 166.7 mL/hr over 90 Minutes Intravenous Every 24 hours 09/09/13 1057 09/10/13 1432   09/06/13 1600  imipenem-cilastatin (PRIMAXIN) 500 mg in sodium chloride 0.9 % 100 mL IVPB  Status:  Discontinued     500 mg 200 mL/hr over 30 Minutes Intravenous 3 times per day 09/06/13 1310 09/12/13 1048   09/03/13 1100  imipenem-cilastatin (PRIMAXIN) 500 mg in sodium chloride 0.9 % 100 mL IVPB  Status:  Discontinued     500 mg 200 mL/hr over 30 Minutes Intravenous Every 6 hours 09/03/13 1007 09/06/13 1310   09/02/13 1300  vancomycin (VANCOCIN) IVPB 1000 mg/200 mL premix  Status:   Discontinued     1,000 mg 200 mL/hr over 60 Minutes Intravenous Every 12 hours 09/01/13 1527 09/07/13 1340   08/30/13 2000  vancomycin (VANCOCIN) IVPB 750 mg/150 ml premix  Status:  Discontinued     750 mg 150 mL/hr over 60 Minutes Intravenous Every 8 hours 08/30/13 1356 09/01/13 1527   08/29/13 2200  micafungin (MYCAMINE) 100 mg in sodium chloride 0.9 % 100 mL IVPB  Status:  Discontinued     100 mg 100 mL/hr over 1 Hours Intravenous Every 24 hours 08/29/13 2036 09/10/13 1432   08/29/13 0400  vancomycin (VANCOCIN) IVPB 1000 mg/200 mL premix  Status:  Discontinued     1,000 mg 200 mL/hr over 60 Minutes Intravenous Every 8 hours 08/28/13 2358 08/30/13 1356   08/29/13 0400  piperacillin-tazobactam (ZOSYN) IVPB 3.375 g  Status:  Discontinued     3.375 g 12.5 mL/hr over 240 Minutes Intravenous 3 times per day 08/28/13 2358 09/03/13 1002   08/28/13 1845  [MAR Hold]  vancomycin (VANCOCIN) IVPB 1000 mg/200 mL premix     (On MAR Hold since 08/28/13 1957)   1,000 mg 200 mL/hr over 60 Minutes Intravenous  Once 08/28/13 1831 08/28/13 1955   08/28/13 1845  [MAR Hold]  piperacillin-tazobactam (ZOSYN) IVPB 3.375 g     (On MAR Hold since 08/28/13 1957)   3.375 g 100 mL/hr over 30 Minutes Intravenous  Once 08/28/13 1831 08/28/13 2056      Assessment/Plan: s/p Procedure(s): EXPLORATORY LAPAROTOMY,debridment of nacrotic stomach,and primary repair of perforated stomach. (N/A)   Rt lat abd drain placed 2/6 Will follow Plan per [facility]   LOS: 33 days    Aleeya Veitch A 09/30/2013

## 2013-09-30 NOTE — Progress Notes (Signed)
Physical Therapy Treatment Patient Details Name: Pamalee LeydenChristopher D Vida MRN: [id number] DOB: 02/14/1966 Today's Date: 09/30/2013 Time: 1010-1025 PT Time Calculation (min): 15 min  PT Assessment / Plan / Recommendation  History of Present Illness 48 yo male smoker presented with abdominal pain from perforated gastric ulcer in setting of NSAID.  PCCM consulted to assist with septic shock and respiratory failure management.  Reintubated  1/24 and reextubated 1/26.   PT Comments   *Pt is progressing with mobility. With walking today his HR went up to 141, pt feels he "overdid it, but I'm trying to push myself a little more each day". **  Follow Up Recommendations  Home health PT     Does the patient have the potential to tolerate intense rehabilitation     Barriers to Discharge        Equipment Recommendations  Rolling walker with 5" wheels    Recommendations for Other Services    Frequency Min 3X/week   Progress towards PT Goals Progress towards PT goals: Progressing toward goals  Plan Current plan remains appropriate    Precautions / Restrictions Precautions Precautions: Fall Precaution Comments: watch HR (tachy) Required Braces or Orthoses: Other Brace/Splint (abdominal binder) Other Brace/Splint: abdominal binder Restrictions Weight Bearing Restrictions: No Other Position/Activity Restrictions: NG tube   Pertinent Vitals/Pain *HR 141 with walking, HR 109 after 1 minute rest**    Mobility  Bed Mobility Overal bed mobility: Needs Assistance Bed Mobility: Supine to Sit;Sit to Supine;Rolling Rolling: Supervision Sidelying to sit: Supervision General bed mobility comments: VCs for technique Transfers Overall transfer level: Needs assistance Equipment used: Rolling walker (2 wheeled) Transfers: Sit to/from Stand Sit to Stand: Supervision General transfer comment: vc's for Rybka placement Ambulation/Gait Ambulation/Gait assistance: Supervision Ambulation Distance (Feet): 400  Feet Assistive device: Rolling walker (2 wheeled) Gait Pattern/deviations: Step-through pattern Gait velocity: slow General Gait Details: patient with some decreased endurance but no physical assist needed, HR up to 141 with walking, 109 after 1 minute rest    Exercises     PT Diagnosis:    PT Problem List:   PT Treatment Interventions:     PT Goals (current goals can now be found in the care plan section) Acute Rehab PT Goals Patient Stated Goal: Need to get back to work PT Goal Formulation: With patient Time For Goal Achievement: 10/08/13 Potential to Achieve Goals: Good  Visit Information  Last PT Received On: 09/30/13 Assistance Needed: +1 History of Present Illness: 48 yo male smoker presented with abdominal pain from perforated gastric ulcer in setting of NSAID.  PCCM consulted to assist with septic shock and respiratory failure management.  Reintubated  1/24 and reextubated 1/26.    Subjective Data  Patient Stated Goal: Need to get back to work   Cognition  Cognition Arousal/Alertness: Awake/alert Behavior During Therapy: WFL for tasks assessed/performed Overall Cognitive Status: Within Functional Limits for tasks assessed    Balance  Balance Sitting balance-Leahy Scale: Good Standing balance-Leahy Scale: Good  End of Session PT - End of Session Equipment Utilized During Treatment: Gait belt Activity Tolerance: Patient limited by fatigue;Treatment limited secondary to medical complications (Comment) (HR 141 with walking) Patient left: in bed;with call bell/phone within reach Nurse Communication: Mobility status   GP     Ralene BatheUhlenberg, Thorsten Climer Kistler 09/30/2013, 10:32 AM [phone]

## 2013-09-30 NOTE — Progress Notes (Signed)
PARENTERAL NUTRITION CONSULT NOTE - FOLLOW UP  Pharmacy Consult for TPN Indication: Prolonged ileus  No Known Allergies  Patient Measurements: Height: '5\' 11"'  (180.3 cm) Weight: 202 lb 9.6 oz (91.9 kg) IBW/kg (Calculated) : 75.3 Vital Signs: Temp: 98.1 F (36.7 C) (02/10 0641) Temp src: Oral (02/10 0641) BP: 118/75 mmHg (02/10 0641) Pulse Rate: 105 (02/10 0641) Intake/Output from previous day: 02/09 0701 - 02/10 0700 In: 2505 [TPN:2490] Out: 2710 [Urine:2275; Emesis/NG output:250; Drains:185] Intake/Output from this shift: Total I/O In: -  Out: 150 [Urine:150]  Labs:  Recent Labs  09/29/13 0618 09/30/13 0552  WBC 9.7 10.6*  HGB 10.2* 10.4*  HCT 35.5* 31.3*  PLT 209 236    Recent Labs  09/29/13 0618  NA 135*  K 4.3  CL 103  CO2 21  GLUCOSE 130*  BUN 38*  CREATININE 1.11  CALCIUM 7.7*  MG 1.9  PHOS 4.0  PROT 6.8  ALBUMIN 2.2*  AST 17  ALT 18  ALKPHOS 140*  BILITOT 0.3  PREALBUMIN 21.3  TRIG 214*   Estimated Creatinine Clearance: 93.3 ml/min (by C-G formula based on Cr of 1.11).    Recent Labs  09/29/13 1956 09/29/13 2334 09/30/13 0615  GLUCAP 159* 183* 96   Insulin Requirements in the past 24 hours:  40 units insulin in TPN + 6 units from SSI during 12 hr cycle  Current Nutrition:  Clinimix E 5/15 2400 mL over 12 hours + lipid emulsion 20% at 240 mL over 12 hr- provides 120 gm protein and 2352 kcal which is 92% protein goals and 100% kcal goals NPO  Nutritional Goals:  2250-2450 kcal and 130-140 gm protein per RD 2/3  Assessment:  48 y.o. male presented to ED 1/8 with abd pain. CT consistent with perforated stomach ulcer due to NSAID use, pneumoperitoneum, peritonitis. Taken emergently to OR where he underwent exp lap, debridement of necrotic stomach, repair of perforated stomach. Contrast study showed no leak from stomach or small bowel.   GI: CT scan 1/31 poss persistent gastric perf, fluid collection 4.5x4.9, and 3rd collection  between bladder and rectum. 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 Will require NPO/NG/TNA for a long time- TPN has been transitioned to cyclic over 12 hours; abdominal drain- 189m output in past 24 hours  Endo: No h/o DM. CBGs 80-183 during customized CBG checks, also has 40 units of insulin in 24080mbag.   Lytes: Na 135, K 4.3, Mag 1.9, Phso 4, CorCa ~9.1 (2/9)  Heptobili: Alk phos elevated at 140, albumin low at 2.2-, other LFTs wnl. Pre-albumin now WNL at 21.3- good sign of recovering nutritional status  Renal: Scr 1.11, est CrCl ~9025min; UOP 1mL3m/hr in last 24 hours  ID: WBC 9.7 Vanc 1/8>>1/21 Zosyn 1/8>>1/14 Micafungin 1/9>>1/21; 2/6>> Primaxin 1/14>>1/23  Vanc PO 1/21>>1/31 Flagyl 1/21>> Zosyn 2/4>> Fluconazole 1/31>>2/6  Cultures: 2/6 peritoneal abscess: neg 2/1 peritoneal cavity: candida species 1/30 urine: Negative 1/21 CDiff Positive 1/16 Trach aspirate: negative 1/9 Blood cultures: negative  TPN Access: triple lumen PICC 1/20; replaced 1/27  TPN day#: 32  Plan:  1. Continue Clinimix E 5/15 + full trace elements and multivitamins + 40 units of regular insulin at cyclic rate- 50 ml/hr x 1 hr, then run at 230 ml/hr x 10 hrs, then run at 50 ml/hr x1 hr for 12 hours total. 20% Lipids to run at 20mL76mfor 12 hours. 2. Continue 1/2NS at 20mL/58mNLY when TPN is not running (0600-1800) 3. CBG checks and moderate SSI correction  at 0600/1200/2000/2400 4. Other TPN labs as ordered  Yulia Ulrich D. Rozella Servello, PharmD, BCPS Clinical Pharmacist Pager: [phone] 09/30/2013 9:52 AM

## 2013-09-30 NOTE — Progress Notes (Signed)
Patient ID: James Mccormick, male   DOB: 01-25-1966, 48 y.o.   MRN: [id number]   Subjective: Had 2 BMs yesterday, passing flatus.  Denies abdominal pain.  Feels like he is getting his strength back, ambulating, sitting up in the chair.  When i told him i contacted psych once again, he states "i feel pretty good."  Objective:  Vital signs:  Filed Vitals:   09/29/13 0612 09/29/13 2154 09/29/13 2320 09/30/13 0641  BP: 125/82 132/88 128/88 118/75  Pulse: 116 110 112 105  Temp: 98.8 F (37.1 C) 99.5 F (37.5 C)  98.1 F (36.7 C)  TempSrc:  Oral  Oral  Resp: '19 20  20  ' Height:      Weight:    202 lb 9.6 oz (91.9 kg)  SpO2: 98% 98%  95%    Last BM Date: 09/29/13  Intake/Output   Yesterday:  02/09 0701 - 02/10 0700 In: 2505 [UUE:2800] Out: 2710 [Urine:2275; Emesis/NG output:250; Drains:185] This shift I/O last 3 completed shifts: In: 5051 [I.V.:160; Other:15; NG/GT:50] Out: 4560 [LKJZP:9150; Emesis/NG output:750; Drains:185]   Physical Exam:  General: Pt awake/alert/oriented and in no acute distress  Chest:CTA. No chest wall pain w good excursion  CV: Pulses intact. Regular rhythm. tachycardic  Bowel sounds are present. Non tender. Bilateral drains with purulent/milky output. Midline wound-beefy red, with areas of fibrinous exudate, dehisced.   Ext: SCDs BLE. No mjr edema. No cyanosis  Skin: No petechiae / purpura   Problem List:   Principal Problem:   Intra-abdominal abscess Active Problems:   Perforated gastric ulcer   ARDS (adult respiratory distress syndrome)   Hypocalcemia   Hypophosphatemia   Protein-calorie malnutrition, moderate   Atrial fibrillation   Pleural effusion   C. difficile colitis   Depression   Cigarette smoker    Results:   Labs: Results for orders placed during the hospital encounter of 08/28/13 (from the past 48 hour(s))  GLUCOSE, CAPILLARY     Status: Abnormal   Collection Time    09/28/13 12:17 PM      Result Value Range   Glucose-Capillary 114 (*) 70 - 99 mg/dL   Comment 1 Notify RN    HIV ANTIBODY (ROUTINE TESTING)     Status: None   Collection Time    09/28/13  4:50 PM      Result Value Range   HIV NON REACTIVE  NON REACTIVE   Comment: Performed at [place], CAPILLARY     Status: None   Collection Time    09/28/13  5:27 PM      Result Value Range   Glucose-Capillary 89  70 - 99 mg/dL   Comment 1 Notify RN    GLUCOSE, CAPILLARY     Status: Abnormal   Collection Time    09/29/13 12:21 AM      Result Value Range   Glucose-Capillary 170 (*) 70 - 99 mg/dL   Comment 1 Notify RN     Comment 2 Documented in Chart    GLUCOSE, CAPILLARY     Status: Abnormal   Collection Time    09/29/13  6:06 AM      Result Value Range   Glucose-Capillary 148 (*) 70 - 99 mg/dL   Comment 1 Notify RN     Comment 2 Documented in Chart    COMPREHENSIVE METABOLIC PANEL     Status: Abnormal   Collection Time    09/29/13  6:18 AM      Result Value  Range   Sodium 135 (*) 137 - 147 mEq/L   Potassium 4.3  3.7 - 5.3 mEq/L   Chloride 103  96 - 112 mEq/L   CO2 21  19 - 32 mEq/L   Glucose, Bld 130 (*) 70 - 99 mg/dL   BUN 38 (*) 6 - 23 mg/dL   Creatinine, Ser 1.11  0.50 - 1.35 mg/dL   Calcium 7.7 (*) 8.4 - 10.5 mg/dL   Total Protein 6.8  6.0 - 8.3 g/dL   Albumin 2.2 (*) 3.5 - 5.2 g/dL   AST 17  0 - 37 U/L   ALT 18  0 - 53 U/L   Alkaline Phosphatase 140 (*) 39 - 117 U/L   Total Bilirubin 0.3  0.3 - 1.2 mg/dL   GFR calc non Af Amer 76 (*) >90 mL/min   GFR calc Af Amer 88 (*) >90 mL/min   Comment: (NOTE)     The eGFR has been calculated using the CKD EPI equation.     This calculation has not been validated in all clinical situations.     eGFR's persistently <90 mL/min signify possible Chronic Kidney     Disease.  MAGNESIUM     Status: None   Collection Time    09/29/13  6:18 AM      Result Value Range   Magnesium 1.9  1.5 - 2.5 mg/dL  PHOSPHORUS     Status: None   Collection Time    09/29/13   6:18 AM      Result Value Range   Phosphorus 4.0  2.3 - 4.6 mg/dL  CBC     Status: Abnormal   Collection Time    09/29/13  6:18 AM      Result Value Range   WBC 9.7  4.0 - 10.5 K/uL   RBC 3.35 (*) 4.22 - 5.81 MIL/uL   Hemoglobin 10.2 (*) 13.0 - 17.0 g/dL   HCT 35.5 (*) 39.0 - 52.0 %   MCV 106.0 (*) 78.0 - 100.0 fL   MCH 30.4  26.0 - 34.0 pg   MCHC 28.7 (*) 30.0 - 36.0 g/dL   RDW 15.7 (*) 11.5 - 15.5 %   Platelets 209  150 - 400 K/uL  DIFFERENTIAL     Status: None   Collection Time    09/29/13  6:18 AM      Result Value Range   Neutrophils Relative % 63  43 - 77 %   Lymphocytes Relative 26  12 - 46 %   Monocytes Relative 9  3 - 12 %   Eosinophils Relative 1  0 - 5 %   Basophils Relative 1  0 - 1 %   Neutro Abs 6.1  1.7 - 7.7 K/uL   Lymphs Abs 2.5  0.7 - 4.0 K/uL   Monocytes Absolute 0.9  0.1 - 1.0 K/uL   Eosinophils Absolute 0.1  0.0 - 0.7 K/uL   Basophils Absolute 0.1  0.0 - 0.1 K/uL  PREALBUMIN     Status: None   Collection Time    09/29/13  6:18 AM      Result Value Range   Prealbumin 21.3  17.0 - 34.0 mg/dL   Comment: Performed at [place]     Status: Abnormal   Collection Time    09/29/13  6:18 AM      Result Value Range   Triglycerides 214 (*) <150 mg/dL  GLUCOSE, CAPILLARY     Status: None  Collection Time    09/29/13  7:59 AM      Result Value Range   Glucose-Capillary 80  70 - 99 mg/dL   Comment 1 Notify RN    GLUCOSE, CAPILLARY     Status: Abnormal   Collection Time    09/29/13  2:02 PM      Result Value Range   Glucose-Capillary 103 (*) 70 - 99 mg/dL   Comment 1 Notify RN    GLUCOSE, CAPILLARY     Status: Abnormal   Collection Time    09/29/13  6:07 PM      Result Value Range   Glucose-Capillary 103 (*) 70 - 99 mg/dL  GLUCOSE, CAPILLARY     Status: Abnormal   Collection Time    09/29/13  7:56 PM      Result Value Range   Glucose-Capillary 159 (*) 70 - 99 mg/dL   Comment 1 Notify RN    GLUCOSE, CAPILLARY     Status:  Abnormal   Collection Time    09/29/13 11:34 PM      Result Value Range   Glucose-Capillary 183 (*) 70 - 99 mg/dL   Comment 1 Notify RN    CBC     Status: Abnormal   Collection Time    09/30/13  5:52 AM      Result Value Range   WBC 10.6 (*) 4.0 - 10.5 K/uL   RBC 3.50 (*) 4.22 - 5.81 MIL/uL   Hemoglobin 10.4 (*) 13.0 - 17.0 g/dL   HCT 31.3 (*) 39.0 - 52.0 %   MCV 89.4  78.0 - 100.0 fL   Comment: REPEATED TO VERIFY     DELTA CHECK NOTED   MCH 29.7  26.0 - 34.0 pg   MCHC 33.2  30.0 - 36.0 g/dL   RDW 15.0  11.5 - 15.5 %   Platelets 236  150 - 400 K/uL  GLUCOSE, CAPILLARY     Status: None   Collection Time    09/30/13  6:15 AM      Result Value Range   Glucose-Capillary 96  70 - 99 mg/dL    Imaging / Studies: No results found.  Medications / Allergies: per chart  Antibiotics: Anti-infectives   Start     Dose/Rate Route Frequency Ordered Stop   09/26/13 1800  vancomycin (VANCOCIN) 50 mg/mL oral solution 250 mg  Status:  Discontinued     250 mg Oral 4 times per day 09/26/13 1510 09/26/13 1554   09/26/13 1600  metroNIDAZOLE (FLAGYL) IVPB 500 mg     500 mg 100 mL/hr over 60 Minutes Intravenous Every 6 hours 09/26/13 1556     09/26/13 1515  micafungin (MYCAMINE) 100 mg in sodium chloride 0.9 % 100 mL IVPB     100 mg 100 mL/hr over 1 Hours Intravenous Daily 09/26/13 1510     09/24/13 0930  piperacillin-tazobactam (ZOSYN) IVPB 3.375 g     3.375 g 12.5 mL/hr over 240 Minutes Intravenous 3 times per day 09/24/13 0832     09/22/13 1400  metroNIDAZOLE (FLAGYL) IVPB 500 mg  Status:  Discontinued     500 mg 100 mL/hr over 60 Minutes Intravenous Every 8 hours 09/22/13 1119 09/26/13 1510   09/20/13 1230  fluconazole (DIFLUCAN) IVPB 200 mg  Status:  Discontinued     200 mg 100 mL/hr over 60 Minutes Intravenous Every 24 hours 09/20/13 1154 09/26/13 1510   09/20/13 1200  fluconazole (DIFLUCAN) IVPB 100 mg  Status:  Discontinued  100 mg 50 mL/hr over 60 Minutes Intravenous Every 24  hours 09/20/13 1151 09/20/13 1154   09/10/13 2200  metroNIDAZOLE (FLAGYL) IVPB 500 mg  Status:  Discontinued     500 mg 100 mL/hr over 60 Minutes Intravenous 3 times per day 09/10/13 1802 09/22/13 1118   09/10/13 2000  vancomycin (VANCOCIN) 50 mg/mL oral solution 500 mg  Status:  Discontinued     500 mg Oral 4 times per day 09/10/13 1802 09/21/13 1151   09/10/13 1800  vancomycin (VANCOCIN) 50 mg/mL oral solution 500 mg  Status:  Discontinued     500 mg Oral 4 times per day 09/10/13 1432 09/10/13 1802   09/10/13 1600  metroNIDAZOLE (FLAGYL) IVPB 500 mg  Status:  Discontinued     500 mg 100 mL/hr over 60 Minutes Intravenous 3 times per day 09/10/13 1432 09/10/13 1802   09/09/13 1602  vancomycin (VANCOCIN) 1,250 mg in sodium chloride 0.9 % 250 mL IVPB  Status:  Discontinued     1,250 mg 166.7 mL/hr over 90 Minutes Intravenous Every 24 hours 09/09/13 1057 09/10/13 1432   09/06/13 1600  imipenem-cilastatin (PRIMAXIN) 500 mg in sodium chloride 0.9 % 100 mL IVPB  Status:  Discontinued     500 mg 200 mL/hr over 30 Minutes Intravenous 3 times per day 09/06/13 1310 09/12/13 1048   09/03/13 1100  imipenem-cilastatin (PRIMAXIN) 500 mg in sodium chloride 0.9 % 100 mL IVPB  Status:  Discontinued     500 mg 200 mL/hr over 30 Minutes Intravenous Every 6 hours 09/03/13 1007 09/06/13 1310   09/02/13 1300  vancomycin (VANCOCIN) IVPB 1000 mg/200 mL premix  Status:  Discontinued     1,000 mg 200 mL/hr over 60 Minutes Intravenous Every 12 hours 09/01/13 1527 09/07/13 1340   08/30/13 2000  vancomycin (VANCOCIN) IVPB 750 mg/150 ml premix  Status:  Discontinued     750 mg 150 mL/hr over 60 Minutes Intravenous Every 8 hours 08/30/13 1356 09/01/13 1527   08/29/13 2200  micafungin (MYCAMINE) 100 mg in sodium chloride 0.9 % 100 mL IVPB  Status:  Discontinued     100 mg 100 mL/hr over 1 Hours Intravenous Every 24 hours 08/29/13 2036 09/10/13 1432   08/29/13 0400  vancomycin (VANCOCIN) IVPB 1000 mg/200 mL premix   Status:  Discontinued     1,000 mg 200 mL/hr over 60 Minutes Intravenous Every 8 hours 08/28/13 2358 08/30/13 1356   08/29/13 0400  piperacillin-tazobactam (ZOSYN) IVPB 3.375 g  Status:  Discontinued     3.375 g 12.5 mL/hr over 240 Minutes Intravenous 3 times per day 08/28/13 2358 09/03/13 1002   08/28/13 1845  [MAR Hold]  vancomycin (VANCOCIN) IVPB 1000 mg/200 mL premix     (On MAR Hold since 08/28/13 1957)   1,000 mg 200 mL/hr over 60 Minutes Intravenous  Once 08/28/13 1831 08/28/13 1955   08/28/13 1845  [MAR Hold]  piperacillin-tazobactam (ZOSYN) IVPB 3.375 g     (On MAR Hold since 08/28/13 1957)   3.375 g 100 mL/hr over 30 Minutes Intravenous  Once 08/28/13 1831 08/28/13 2056      Assessment/Plan  Acute respiratory failure-resolved  Hx HTN-stable. Continue with metoprolol IV.  NSTEMI-ischemia demand, stable, cards follow up  Acute systolic heart failure-echo 08/29/13 with diffuse hypokinesis with EF 40-45%, poor study, recommend repeat. Will need cardiology follow up.  Septic shock 2/2 peritonitis-resolved  AKI-resolved  Acute encephalopathy-resolved  Perforated gastric ulcer s/p exploratory laparotomy Dr. Redmond Pulling 08/28/13  POD#33  -s/p IR drain  placement to RUQ 2/5--culture negative to date.  2 additional surgical drains placed at time of surgery  -white count up today to 10.6 tachycardia has improved, now in low 110s and high 100s. We will plan to repeat a CT scan tomorrow or Thursday -restarted zosyn 2/4--->. Micafungin 2/6--->. Indication: UGI fistula, multiple intra-abdominal fluid collections.  -appreciate ID assistance  -midline wound has dehisced, continue with BID wet to dry dressing changes  -Continue NGT to suction, TPN, NPO  -VTE prophylaxis; SCDs, lovenox  PCM  -continue with TPN  Deconditioning-PT/OT, up to chair, ambulate.  Anemia of critical illness-stable  Hypokalemia-resolved  C. Diff colitis  -treatment completed. Continue with flagyl while on antibiotics to  reduce recurrence  Depression/anxiety/PTSD -psych consult  Erby Pian, [facility] Surgery Pager [phone] Office [phone]  09/30/2013 7:45 AM

## 2013-09-30 NOTE — Progress Notes (Signed)
OT Cancellation Note  Patient Details Name: James LeydenChristopher D Mccormick MRN: [id number] DOB: 12/15/1965   Cancelled Treatment:    Reason Eval/Treat Not Completed: Other (comment) (Pt refused due to him having diarrhea.)  Earlie RavelingStraub, Chrisma Hurlock L OTR/L [phone] 09/30/2013, 4:13 PM

## 2013-09-30 NOTE — Progress Notes (Signed)
Patient ID: James LeydenChristopher D Archey, male   DOB: 01/02/1966, 48 y.o.   MRN: [id number]         [facility]    Date of Admission:  08/28/2013    Total days of antibiotics 33        Day 21 metronidazole        Day 7 piperacillin tazobactam        Day 4 micafungin Principal Problem:   Intra-abdominal abscess Active Problems:   Perforated gastric ulcer   ARDS (adult respiratory distress syndrome)   Hypocalcemia   Hypophosphatemia   Protein-calorie malnutrition, moderate   Atrial fibrillation   Pleural effusion   C. difficile colitis   Depression   Cigarette smoker   . sodium chloride   Intravenous Q24H  . antiseptic oral rinse  15 mL Mouth Rinse q12n4p  . chlorhexidine  15 mL Mouth Rinse BID  . enoxaparin (LOVENOX) injection  40 mg Subcutaneous Q24H  . insulin aspart  0-15 Units Subcutaneous Custom  . metoprolol  5 mg Intravenous Q4H  . metronidazole  500 mg Intravenous Q6H  . micafungin (MYCAMINE) IV  100 mg Intravenous Daily  . pantoprazole (PROTONIX) IV  40 mg Intravenous Q12H  . piperacillin-tazobactam (ZOSYN)  IV  3.375 g Intravenous Q8H  . sodium chloride  10-40 mL Intracatheter Q12H    Objective: Temp:  [98.1 F (36.7 C)-99.5 F (37.5 C)] 98.4 F (36.9 C) (02/10 1338) Pulse Rate:  [104-141] 104 (02/10 1338) Resp:  [19-20] 19 (02/10 1338) BP: (118-132)/(75-88) 119/84 mmHg (02/10 1338) SpO2:  [95 %-98 %] 97 % (02/10 1338) Weight:  [91.9 kg (202 lb 9.6 oz)] 91.9 kg (202 lb 9.6 oz) (02/10 0641)  A total of 185 cc of fluid out of his 3 abdominal drainage yesterday.   Lab Results Lab Results  Component Value Date   WBC 10.6* 09/30/2013   HGB 10.4* 09/30/2013   HCT 31.3* 09/30/2013   MCV 89.4 09/30/2013   PLT 236 09/30/2013    Lab Results  Component Value Date   CREATININE 1.11 09/29/2013   BUN 38* 09/29/2013   NA 135* 09/29/2013   K 4.3 09/29/2013   CL 103 09/29/2013   CO2 21 09/29/2013    Lab Results  Component Value Date   ALT 18 09/29/2013   AST 17 09/29/2013   ALKPHOS 140* 09/29/2013   BILITOT 0.3 09/29/2013      Microbiology: Recent Results (from the past 240 hour(s))  CULTURE, ROUTINE-ABSCESS     Status: None   Collection Time    09/21/13 11:40 AM      Result Value Range Status   Specimen Description PERITONEAL CAVITY   Final   Special Requests Normal   Final   Gram Stain     Final   Value: ABUNDANT WBC PRESENT, PREDOMINANTLY PMN     NO SQUAMOUS EPITHELIAL CELLS SEEN     NO ORGANISMS SEEN     Performed at [facility]   Culture     Final   Value: FEW CANDIDA GLABRATA     Performed at [facility]   Report Status 09/30/2013 FINAL   Final  CULTURE, ROUTINE-ABSCESS     Status: None   Collection Time    09/26/13  4:02 PM      Result Value Range Status   Specimen Description ABSCESS PERITONEAL CAVITY   Final   Special Requests RIGHT LATERAL ABOMINAL   Final   Gram Stain  Final   Value: ABUNDANT WBC PRESENT, PREDOMINANTLY PMN     NO SQUAMOUS EPITHELIAL CELLS SEEN     NO ORGANISMS SEEN     Performed at [facility]   Culture     Final   Value: NO GROWTH 3 DAYS     Performed at [facility]   Report Status 09/29/2013 FINAL   Final    Assessment: Candida glabrata has shown higher rates of resistance to fluconazole recently. I will continue micafungin for now. Repeat abscess Gram stain and culture from February 6 shows no organisms and no growth so far. I will narrow piperacillin tazobactam to IV cefepime. This will continue to give good coverage of hospital-acquired gram-negative rods with less pressure on normal gut flora that will make C. difficile colitis more difficult to care her.  Plan: 1. Continue micafungin and metronidazole 2. Change piperacillin tazobactam to cefepime 3. Await final results of stains and cultures  Cliffton Asters, MD [facility] [phone] pager   [phone] cell 09/30/2013, 2:39 PM

## 2013-09-30 NOTE — Consult Note (Signed)
Reason for Consult: Depression/PTSD and medication management Referring Physician: Erby Pian, NP  James Mccormick is an 48 y.o. male.  HPI: Patient is seen and chart reviewed. Patient daughter is in his room. Patient has been suffering with depression and anxiety over 12 years due to life stresses. He was separated and divorced from his wife about 20 years ago. He has been working as Cabin crew. He lives with his daughter who is 23 years and working. He has son in [place]. Patient has been treated with medication for depression and anxiety from a PCP in [place], [place]. He has denied current symptoms of depression, anxiety and suicidal or homicidal ideations. He has no history of psych hospitalization. He has denied current alcohol or substance abuse vs dependence. Reportedly he has chronic stress headaches and used Goody power 4-6 packs daily over six years. He feels that he learned that he has done too much in a harder away.   Medical History: James Mccormick is an 48 y.o. 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 IR was consulted previously s/p aspiration of right subphrenic fluid collection 09/21/13. Repeat CT 09/25/13 revealed increasing right lateral abdominal fluid collection. Request for image guided percutaneous drain placement, images reviewed by Dr. Anselm Pancoast. The patient denies any chest pain or shortness of breath, he does have blood production via NGT, he denies any difficulty with previous conscious sedation on 09/21/13. The patient's family is in the room today during our discussion.   ROS: as per history and physical   MSE: Patient is calm and cooperative. Patient has decreased psychomotor activity, fine mood and affect. He has normal speech and thought process. He has normal language. He has no suicidal or homicidal ideation or  psychosis.  Past Medical History  Diagnosis Date  . GERD (gastroesophageal reflux disease)   . Hypertension   . Depression   . Pleural effusion   . Dysrhythmia     ATRIAL FIBRILATION 08/2013    Past Surgical History  Procedure Laterality Date  . Neck surgery    . Laparotomy N/A 08/28/2013    Procedure: EXPLORATORY LAPAROTOMY,debridment of nacrotic stomach,and primary repair of perforated stomach.;  Surgeon: Gayland Curry, MD;  Location: [facility] OR;  Service: General;  Laterality: N/A;    Family History  Problem Relation Age of Onset  . Mental illness Mother   . Hypertension Sister   . Hypertension Brother     Social History:  reports that he has been smoking Cigarettes.  He has been smoking about 1.00 pack per day. He does not have any smokeless tobacco history on file. He reports that he does not drink alcohol or use illicit drugs.  Allergies: No Known Allergies  Medications: I have reviewed the patient's current medications.  Results for orders placed during the hospital encounter of 08/28/13 (from the past 48 hour(s))  HIV ANTIBODY (ROUTINE TESTING)     Status: None   Collection Time    09/28/13  4:50 PM      Result Value Range   HIV NON REACTIVE  NON REACTIVE   Comment: Performed at [place], [facility]     Status: None   Collection Time    09/28/13  5:27 PM      Result Value Range   Glucose-Capillary 89  70 - 99 mg/dL   Comment 1  Notify RN    GLUCOSE, [facility]     Status: Abnormal   Collection Time    09/29/13 12:21 AM      Result Value Range   Glucose-Capillary 170 (*) 70 - 99 mg/dL   Comment 1 Notify RN     Comment 2 Documented in Chart    GLUCOSE, CAPILLARY     Status: Abnormal   Collection Time    09/29/13  6:06 AM      Result Value Range   Glucose-Capillary 148 (*) 70 - 99 mg/dL   Comment 1 Notify RN     Comment 2 Documented in Chart    COMPREHENSIVE METABOLIC PANEL     Status: Abnormal   Collection Time    09/29/13  6:18 AM       Result Value Range   Sodium 135 (*) 137 - 147 mEq/L   Potassium 4.3  3.7 - 5.3 mEq/L   Chloride 103  96 - 112 mEq/L   CO2 21  19 - 32 mEq/L   Glucose, Bld 130 (*) 70 - 99 mg/dL   BUN 38 (*) 6 - 23 mg/dL   Creatinine, Ser 1.11  0.50 - 1.35 mg/dL   Calcium 7.7 (*) 8.4 - 10.5 mg/dL   Total Protein 6.8  6.0 - 8.3 g/dL   Albumin 2.2 (*) 3.5 - 5.2 g/dL   AST 17  0 - 37 U/L   ALT 18  0 - 53 U/L   Alkaline Phosphatase 140 (*) 39 - 117 U/L   Total Bilirubin 0.3  0.3 - 1.2 mg/dL   GFR calc non Af Amer 76 (*) >90 mL/min   GFR calc Af Amer 88 (*) >90 mL/min   Comment: (NOTE)     The eGFR has been calculated using the CKD EPI equation.     This calculation has not been validated in all clinical situations.     eGFR's persistently <90 mL/min signify possible Chronic Kidney     Disease.  MAGNESIUM     Status: None   Collection Time    09/29/13  6:18 AM      Result Value Range   Magnesium 1.9  1.5 - 2.5 mg/dL  PHOSPHORUS     Status: None   Collection Time    09/29/13  6:18 AM      Result Value Range   Phosphorus 4.0  2.3 - 4.6 mg/dL  CBC     Status: Abnormal   Collection Time    09/29/13  6:18 AM      Result Value Range   WBC 9.7  4.0 - 10.5 K/uL   RBC 3.35 (*) 4.22 - 5.81 MIL/uL   Hemoglobin 10.2 (*) 13.0 - 17.0 g/dL   HCT 35.5 (*) 39.0 - 52.0 %   MCV 106.0 (*) 78.0 - 100.0 fL   MCH 30.4  26.0 - 34.0 pg   MCHC 28.7 (*) 30.0 - 36.0 g/dL   RDW 15.7 (*) 11.5 - 15.5 %   Platelets 209  150 - 400 K/uL  DIFFERENTIAL     Status: None   Collection Time    09/29/13  6:18 AM      Result Value Range   Neutrophils Relative % 63  43 - 77 %   Lymphocytes Relative 26  12 - 46 %   Monocytes Relative 9  3 - 12 %   Eosinophils Relative 1  0 - 5 %   Basophils Relative 1  0 - 1 %  Neutro Abs 6.1  1.7 - 7.7 K/uL   Lymphs Abs 2.5  0.7 - 4.0 K/uL   Monocytes Absolute 0.9  0.1 - 1.0 K/uL   Eosinophils Absolute 0.1  0.0 - 0.7 K/uL   Basophils Absolute 0.1  0.0 - 0.1 K/uL  PREALBUMIN     Status:  None   Collection Time    09/29/13  6:18 AM      Result Value Range   Prealbumin 21.3  17.0 - 34.0 mg/dL   Comment: Performed at [place]     Status: Abnormal   Collection Time    09/29/13  6:18 AM      Result Value Range   Triglycerides 214 (*) <150 mg/dL  GLUCOSE, CAPILL[facility]     Status: None   Collection Time    09/29/13  7:59 AM      Result Value Range   Glucose-Capillary 80  70 - 99 mg/dL   Comment 1 Notify RN    GLUCOSE, CAPILLARY     Status: Abnormal   Collection Time    09/29/13  2:02 PM      Result Value Range   Glucose-Capillary 103 (*) 70 - 99 mg/dL   Comment 1 Notify RN    GLUCOSE, CAPILLARY     Status: Abnormal   Collection Time    09/29/13  6:07 PM      Result Value Range   Glucose-Capillary 103 (*) 70 - 99 mg/dL  GLUCOSE, CAPILLARY     Status: Abnormal   Collection Time    09/29/13  7:56 PM      Result Value Range   Glucose-Capillary 159 (*) 70 - 99 mg/dL   Comment 1 Notify RN    GLUCOSE, CAPILLARY     Status: Abnormal   Collection Time    09/29/13 11:34 PM      Result Value Range   Glucose-Capillary 183 (*) 70 - 99 mg/dL   Comment 1 Notify RN    CBC     Status: Abnormal   Collection Time    09/30/13  5:52 AM      Result Value Range   WBC 10.6 (*) 4.0 - 10.5 K/uL   RBC 3.50 (*) 4.22 - 5.81 MIL/uL   Hemoglobin 10.4 (*) 13.0 - 17.0 g/dL   HCT 31.3 (*) 39.0 - 52.0 %   MCV 89.4  78.0 - 100.0 fL   Comment: REPEATED TO VERIFY     DELTA CHECK NOTED   MCH 29.7  26.0 - 34.0 pg   MCHC 33.2  30.0 - 36.0 g/dL   RDW 15.0  11.5 - 15.5 %   Platelets 236  150 - 400 K/uL  GLUCOSE, CAPILLARY     Status: None   Collection Time    09/30/13  6:15 AM      Result Value Range   Glucose-Capillary 96  70 - 99 mg/dL  GLUCOSE, CAPILLARY     Status: None   Collection Time    09/30/13 12:26 PM      Result Value Range   Glucose-Capillary 88  70 - 99 mg/dL    No results found.  Positive for anxiety, depression and sleep disturbance Blood  pressure 119/84, pulse 104, temperature 98.4 F (36.9 C), temperature source Oral, resp. rate 19, height '5\' 11"'  (1.803 m), weight 91.9 kg (202 lb 9.6 oz), SpO2 97.00%.   Assessment/Plan: Major depressive disorder, chronic  Recommendation: Hold his depression medication for now Patient and his  adult daughter is in agreement with the plan May start Sertraline 50 mg PO QAM when he is able to eat solid food in near future Patient does not meet criteria for acute psych hospitalization Appreciate psych consult and will follow up as clinically required May call [phone] if needs further assistance in this case.  Celsey Asselin,JANARDHAHA R. 09/30/2013, 2:24 PM

## 2013-10-01 LAB — CBC
HEMATOCRIT: 31 % — AB (ref 39.0–52.0)
HEMOGLOBIN: 10.2 g/dL — AB (ref 13.0–17.0)
MCH: 29.2 pg (ref 26.0–34.0)
MCHC: 32.9 g/dL (ref 30.0–36.0)
MCV: 88.8 fL (ref 78.0–100.0)
Platelets: 229 10*3/uL (ref 150–400)
RBC: 3.49 MIL/uL — ABNORMAL LOW (ref 4.22–5.81)
RDW: 15.1 % (ref 11.5–15.5)
WBC: 9.7 10*3/uL (ref 4.0–10.5)

## 2013-10-01 LAB — GLUCOSE, CAPILLARY
Glucose-Capillary: 106 mg/dL — ABNORMAL HIGH (ref 70–99)
Glucose-Capillary: 107 mg/dL — ABNORMAL HIGH (ref 70–99)
Glucose-Capillary: 144 mg/dL — ABNORMAL HIGH (ref 70–99)
Glucose-Capillary: 146 mg/dL — ABNORMAL HIGH (ref 70–99)
Glucose-Capillary: 155 mg/dL — ABNORMAL HIGH (ref 70–99)

## 2013-10-01 MED ORDER — FAT EMULSION 20 % IV EMUL
240.0000 mL | INTRAVENOUS | Status: AC
  Administered 2013-10-01: 240 mL via INTRAVENOUS
  Filled 2013-10-01 (×2): qty 250

## 2013-10-01 MED ORDER — TRACE MINERALS CR-CU-F-FE-I-MN-MO-SE-ZN IV SOLN
INTRAVENOUS | Status: AC
  Administered 2013-10-01: 18:00:00 via INTRAVENOUS
  Filled 2013-10-01: qty 2400

## 2013-10-01 NOTE — Progress Notes (Signed)
Improving. Plan F/U CT tomorrow. Additionally, patient has a 4cm area of evolving ecchymosis R inner arm with small palpable mass C/W hematoma. No evidence of cellulitis. ? Localized trauma. Will follow. Patient examined and I agree with the assessment and plan  Violeta GelinasBurke Mkayla Steele, MD, MPH, FACS Pager: [phone]  10/01/2013 9:13 AM

## 2013-10-01 NOTE — Clinical Social Work Psych Assess (Signed)
Clinical Social Work Department CLINICAL SOCIAL WORK PSYCHIATRY SERVICE LINE ASSESSMENT 10/01/2013  Patient:  James Mccormick  Account:  [id number]  Admit Date:  08/28/2013  Clinical Social Worker:  Read Drivers  Date/Time:  09/30/2013 10:24 AM Referred by:  Physician  Date referred:  09/30/2013 Reason for Referral  Behavioral Health Issues   Presenting Symptoms/Problems (In the person's/family's own words):   Psych was consulted for depression and medication management   Abuse/Neglect/Trauma History (check all that apply)  Denies history   Abuse/Neglect/Trauma Comments:   none reported or noted in chart   Psychiatric History (check all that apply)  Denies history   Psychiatric medications:  Pt has hx of Lorazapam; however psychiatrist request depression meds to be held   Current Mental Health Hospitalizations/Previous Mental Health History:   pt denies MH tx hx   Current provider:   none   Place and Date:   none   Current Medications:   Scheduled Meds:      . sodium chloride   Intravenous Q24H  . antiseptic oral rinse  15 mL Mouth Rinse q12n4p  . ceFEPime (MAXIPIME) IV  2 g Intravenous Q12H  . chlorhexidine  15 mL Mouth Rinse BID  . enoxaparin (LOVENOX) injection  40 mg Subcutaneous Q24H  . insulin aspart  0-15 Units Subcutaneous 4 times per day  . metoprolol  5 mg Intravenous Q4H  . metronidazole  500 mg Intravenous Q6H  . micafungin (MYCAMINE) IV  100 mg Intravenous Daily  . pantoprazole (PROTONIX) IV  40 mg Intravenous Q12H  . sodium chloride  10-40 mL Intracatheter Q12H        Continuous Infusions:      . Marland KitchenTPN (CLINIMIX-E) Adult Stopped (10/01/13 0550)     And     . fat emulsion Stopped (10/01/13 0550)          PRN Meds:.acetaminophen, fentaNYL, hydrALAZINE, influenza vac split quadrivalent PF, ipratropium-albuterol, LORazepam, morphine injection, ondansetron, phenol, pneumococcal 23 valent vaccine, sodium chloride       Previous Impatient  Admission/Date/Reason:   Emotional Health / Current Symptoms    Suicide/Self Harm  None reported   Suicide attempt in the past:   none reported or noted in chart   Other harmful behavior:   none reported or noted in chart   Psychotic/Dissociative Symptoms  None reported   Other Psychotic/Dissociative Symptoms:   none reported or noted in chart    Attention/Behavioral Symptoms  Withdrawn   Other Attention / Behavioral Symptoms:   none reported or noted in chart    Cognitive Impairment  Orientation - Place  Orientation - Self  Orientation - Situation  Orientation - Time  Poor Judgement   Other Cognitive Impairment:   none reported or noted in chart    Mood and Adjustment  DEPRESSION    Stress, Anxiety, Trauma, Any Recent Loss/Stressor  Grief/Loss (recent or history)   Anxiety (frequency):   pt endorsed minimal anxiety regarding his medical condition   Phobia (specify):   none reported or noted in chart   Compulsive behavior (specify):   none reported or noted in chart   Obsessive behavior (specify):   none reported or noted in chart   Other:   Pt seems to be having difficulty coping with his medical conditions.  Pt also continues to mourn the divorce of his wife over 20 years ago.  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   Substance Abuse/Use  None   SBIRT completed (please refer for detailed history):  N  Self-reported substance use:   pt denies use   Urinary Drug Screen Completed:  N Alcohol level:   untested UDS- not ordered/complete    Environmental/Housing/Living Arrangement  With Family Member   Who is in the home:   daughter, James Mccormick   Emergency contact:  Medical illustratorCharlie Mccormick- daughter    Patient's Strengths and Goals (patient's own words):   Pt has applied for Medicaid.  Pt has supportive daughter. Pt has a place to call home.  Pt is compliant with  medical treatment and prognosis seems to be positive.   Clinical Social Worker's Interpretive Summary:   Psych CSW assessed pt.  Pt was alert and oriented x4. Conversations and thought processes remained appropriate throughout the assessment.  Pt states that he has declined medically and is having a hard time coping with his sx's. Pt endorses sx of depression: anhedonia, hopelessness and feelings of worthlessness.  Pt states that he has been on depression meds for "quite some time" (15-20 years).  The time frame seemed to fit the time of his divorce from his wife around 20 years ago.  Pt has decreased mood and flat affect.  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  Pt remains optimistic regarding his return to work and his prognosis though is realistic regarding his medical condition.    Pt denies SI, HI and AHVD.  Pt denies SA and ETOH use.    Psychiatry has evaluated patient (09/30/2013) and reports pt not meeting criteria for inpatient psychiatric treatment. Psychiatry has recommended medication hold and a medication addition once the pt is able to eat solid foods. Psychiatry reports that daughter was present at time of evaluation and is agreeable to psychiatric plan/recommendations.   Disposition:  Psych Clinical Social Worker signing off Vickii PennaGina Talyn Dessert, [place]WA [phone]  Clinical Social Work

## 2013-10-01 NOTE — Progress Notes (Signed)
Pt. states he is never without a headache..pain med dulls it but never goes away. Abdomen seldom hurts.

## 2013-10-01 NOTE — Progress Notes (Signed)
Occupational Therapy Treatment Patient Details Name: Pamalee LeydenChristopher D Keay MRN: [id number] DOB: 04/29/1966 Today's Date: 10/01/2013 Time: [id number] OT Time Calculation (min): 29 min  OT Assessment / Plan / Recommendation  History of present illness 48 yo male smoker presented with abdominal pain from perforated gastric ulcer in setting of NSAID.  PCCM consulted to assist with septic shock and respiratory failure management.  Reintubated  1/24 and reextubated 1/26.   OT comments  Pt able to ambulate in hallway today. Educated on energy conservation techniques. Pt performed UE exercises during session.   Follow Up Recommendations  Home health OT;Supervision/Assistance - 24 hour    Barriers to Discharge       Equipment Recommendations  3 in 1 bedside comode    Recommendations for Other Services    Frequency Min 2X/week   Progress towards OT Goals Progress towards OT goals: Progressing toward goals  Plan Discharge plan needs to be updated    Precautions / Restrictions Precautions Precautions: Fall Precaution Comments: watch HR (tachy) Required Braces or Orthoses: Other Brace/Splint Other Brace/Splint: abdominal binder Restrictions Weight Bearing Restrictions: No Other Position/Activity Restrictions: NG tube   Pertinent Vitals/Pain No pain reported. HR in 140's during ambulation, but trended down. Nurse notified.     ADL  Grooming: Wash/dry face;Teeth care;Min guard Where Assessed - Grooming: Supported standing Toilet Transfer: Radiographer, therapeuticupervision/safety Toilet Transfer Method: Sit to Barist[place]and Toilet Transfer Equipment:  (bed) Toileting - Clothing Manipulation and Hygiene: Set up Where Assessed - Toileting Clothing Manipulation and Hygiene: Supine, head of bed flat Equipment Used: Gait belt;Other (comment) (theraband) Transfers/Ambulation Related to ADLs: Min guard/Supervision ADL Comments: Educated on energy conservation techniques during session as well as deep breathing technique. Pt  performed exercises in bed. Educated on chair push ups and importance of pressure relief techniques to avoid pressure sores. Discussed use of bag on walker and also use of long handled sponge.     OT Diagnosis:    OT Problem List:   OT Treatment Interventions:     OT Goals(current goals can now be found in the care plan section) Acute Rehab OT Goals Patient Stated Goal: not stated OT Goal Formulation: With patient Time For Goal Achievement: 10/08/13 Potential to Achieve Goals: Good ADL Goals Pt Will Perform Grooming: with modified independence;standing Pt Will Perform Upper Body Bathing: with modified independence;sitting Pt Will Perform Lower Body Bathing: with modified independence;sit to/from stand Pt Will Transfer to Toilet: with modified independence;ambulating;regular height toilet Pt Will Perform Toileting - Clothing Manipulation and hygiene: with modified independence;sit to/from stand Additional ADL Goal #1: Pt will independently verbalize and demonstrate 3/3 energy conservation techniques.    Visit Information  Last OT Received On: 10/01/13 Assistance Needed: +1 History of Present Illness: 48 yo male smoker presented with abdominal pain from perforated gastric ulcer in setting of NSAID.  PCCM consulted to assist with septic shock and respiratory failure management.  Reintubated  1/24 and reextubated 1/26.    Subjective Data      Prior Functioning       Cognition  Cognition Arousal/Alertness: Awake/alert Behavior During Therapy: WFL for tasks assessed/performed Overall Cognitive Status: Within Functional Limits for tasks assessed    Mobility  Bed Mobility Overal bed mobility: Needs Assistance Bed Mobility: Supine to Sit;Sit to Supine Supine to sit: Supervision Sit to supine: Supervision General bed mobility comments: Supervision for safety. Transfers Overall transfer level: Needs assistance Equipment used: Rolling walker (2 wheeled) Transfers: Sit to/from  Stand Sit to Stand: Supervision General transfer comment: cues to  reinforce technique.    Exercises  General Exercises - Upper Extremity Shoulder Flexion: AROM;Both;15 reps;Supine;Theraband (may have performed less reps on RUE due to place on arm) Theraband Level (Shoulder Flexion): Level 2 (Red) Shoulder ABduction: AROM;Both;15 reps;Supine;Theraband Theraband Level (Shoulder Abduction): Level 2 (Red) Shoulder Horizontal ABduction: AROM;Both;15 reps;Supine;Theraband Theraband Level (Shoulder Horizontal Abduction): Level 2 (Red) Elbow Flexion: AROM;Both;15 reps;Supine (may have performed less reps on RUE due to place on arm) Elbow Extension: AROM;Both;15 reps;Supine;Theraband Theraband Level (Elbow Extension): Level 2 (Red)   Balance    End of Session OT - End of Session Equipment Utilized During Treatment: Gait belt;Rolling walker Activity Tolerance: Patient tolerated treatment well Patient left: in bed;with call bell/phone within reach;with family/visitor present  GO     Earlie Raveling OTR/L [phone] 10/01/2013, 1:10 PM

## 2013-10-01 NOTE — Progress Notes (Signed)
Patient ID: James Mccormick, male   DOB: 1966/04/29, 48 y.o.   MRN: [id number]  Subjective: No changes.  Afebrile.  No pain.  No dysuria.  2 loose BMs yesterday.    Objective:  Vital signs:  Filed Vitals:   09/30/13 2224 09/30/13 2300 10/01/13 0259 10/01/13 0559  BP: 132/89 117/76 128/82 123/77  Pulse: 111 113 120 114  Temp: 98.8 F (37.1 C)   98.6 F (37 C)  TempSrc: Oral     Resp: 18   18  Height:      Weight:    208 lb 15.9 oz (94.8 kg)  SpO2: 100%   96%    Last BM Date: 09/30/13  Intake/Output   Yesterday:  02/10 0701 - 02/11 0700 In: 2120 [I.V.:120; TPN:2000] Out: 3498 [Urine:3250; Emesis/NG output:150; Drains:98] This shift:    I/O last 3 completed shifts: In: 4625 [I.V.:120; Other:15] Out: 4098 [JXBJY:7829; Emesis/NG output:250; Drains:183]     Physical Exam:  General: Pt awake/alert/oriented and in no acute distress  Chest:CTA. No chest wall pain w good excursion  CV: Pulses intact. Regular rhythm. tachycardic  Bowel sounds are present. Non tender. Bilateral drains with purulent/milky output. Midline dressing is c/d/i. Ext: SCDs BLE. No mjr edema. No cyanosis.  Pea sized "knot: with surrounding ecchymosis to RUE medial aspect, will monitor. Skin: No petechiae / purpura   Problem List:   Principal Problem:   Intra-abdominal abscess Active Problems:   Perforated gastric ulcer   ARDS (adult respiratory distress syndrome)   Hypocalcemia   Hypophosphatemia   Protein-calorie malnutrition, moderate   Atrial fibrillation   Pleural effusion   C. difficile colitis   Depression   Cigarette smoker    Results:   Labs: Results for orders placed during the hospital encounter of 08/28/13 (from the past 48 hour(s))  GLUCOSE, CAPILLARY     Status: None   Collection Time    09/29/13  7:59 AM      Result Value Ref Range   Glucose-Capillary 80  70 - 99 mg/dL   Comment 1 Notify RN    GLUCOSE, CAPILLARY     Status: Abnormal   Collection Time    09/29/13   2:02 PM      Result Value Ref Range   Glucose-Capillary 103 (*) 70 - 99 mg/dL   Comment 1 Notify RN    GLUCOSE, CAPILLARY     Status: Abnormal   Collection Time    09/29/13  6:07 PM      Result Value Ref Range   Glucose-Capillary 103 (*) 70 - 99 mg/dL  GLUCOSE, CAPILLARY     Status: Abnormal   Collection Time    09/29/13  7:56 PM      Result Value Ref Range   Glucose-Capillary 159 (*) 70 - 99 mg/dL   Comment 1 Notify RN    GLUCOSE, CAPILLARY     Status: Abnormal   Collection Time    09/29/13 11:34 PM      Result Value Ref Range   Glucose-Capillary 183 (*) 70 - 99 mg/dL   Comment 1 Notify RN    CBC     Status: Abnormal   Collection Time    09/30/13  5:52 AM      Result Value Ref Range   WBC 10.6 (*) 4.0 - 10.5 K/uL   RBC 3.50 (*) 4.22 - 5.81 MIL/uL   Hemoglobin 10.4 (*) 13.0 - 17.0 g/dL   HCT 56.2 (*) 13.0 - 86.5 %  MCV 89.4  78.0 - 100.0 fL   Comment: REPEATED TO VERIFY     DELTA CHECK NOTED   MCH 29.7  26.0 - 34.0 pg   MCHC 33.2  30.0 - 36.0 g/dL   RDW 40.915.0  81.111.5 - 91.415.5 %   Platelets 236  150 - 400 K/uL  GLUCOSE, CAPILLARY     Status: None   Collection Time    09/30/13  6:15 AM      Result Value Ref Range   Glucose-Capillary 96  70 - 99 mg/dL  GLUCOSE, CAPILLARY     Status: None   Collection Time    09/30/13 12:26 PM      Result Value Ref Range   Glucose-Capillary 88  70 - 99 mg/dL  GLUCOSE, CAPILLARY     Status: Abnormal   Collection Time    09/30/13  8:17 PM      Result Value Ref Range   Glucose-Capillary 159 (*) 70 - 99 mg/dL   Comment 1 Notify RN     Comment 2 Documented in Chart    GLUCOSE, CAPILLARY     Status: Abnormal   Collection Time    09/30/13 11:30 PM      Result Value Ref Range   Glucose-Capillary 155 (*) 70 - 99 mg/dL   Comment 1 Notify RN    CBC     Status: Abnormal   Collection Time    10/01/13  5:50 AM      Result Value Ref Range   WBC 9.7  4.0 - 10.5 K/uL   RBC 3.49 (*) 4.22 - 5.81 MIL/uL   Hemoglobin 10.2 (*) 13.0 - 17.0 g/dL    HCT 78.231.0 (*) 95.639.0 - 52.0 %   MCV 88.8  78.0 - 100.0 fL   MCH 29.2  26.0 - 34.0 pg   MCHC 32.9  30.0 - 36.0 g/dL   RDW 21.315.1  08.611.5 - 57.815.5 %   Platelets 229  150 - 400 K/uL  GLUCOSE, CAPILLARY     Status: Abnormal   Collection Time    10/01/13  5:58 AM      Result Value Ref Range   Glucose-Capillary 106 (*) 70 - 99 mg/dL   Comment 1 Notify RN     Comment 2 Documented in Chart      Imaging / Studies: No results found.  Medications / Allergies: per chart  Antibiotics: Anti-infectives   Start     Dose/Rate Route Frequency Ordered Stop   09/30/13 1500  ceFEPIme (MAXIPIME) 2 g in dextrose 5 % 50 mL IVPB     2 g 100 mL/hr over 30 Minutes Intravenous Every 12 hours 09/30/13 1443     09/26/13 1800  vancomycin (VANCOCIN) 50 mg/mL oral solution 250 mg  Status:  Discontinued     250 mg Oral 4 times per day 09/26/13 1510 09/26/13 1554   09/26/13 1600  metroNIDAZOLE (FLAGYL) IVPB 500 mg     500 mg 100 mL/hr over 60 Minutes Intravenous Every 6 hours 09/26/13 1556     09/26/13 1515  micafungin (MYCAMINE) 100 mg in sodium chloride 0.9 % 100 mL IVPB     100 mg 100 mL/hr over 1 Hours Intravenous Daily 09/26/13 1510     09/24/13 0930  piperacillin-tazobactam (ZOSYN) IVPB 3.375 g  Status:  Discontinued     3.375 g 12.5 mL/hr over 240 Minutes Intravenous 3 times per day 09/24/13 0832 09/30/13 1443   09/22/13 1400  metroNIDAZOLE (FLAGYL) IVPB 500 mg  Status:  Discontinued     500 mg 100 mL/hr over 60 Minutes Intravenous Every 8 hours 09/22/13 1119 09/26/13 1510   09/20/13 1230  fluconazole (DIFLUCAN) IVPB 200 mg  Status:  Discontinued     200 mg 100 mL/hr over 60 Minutes Intravenous Every 24 hours 09/20/13 1154 09/26/13 1510   09/20/13 1200  fluconazole (DIFLUCAN) IVPB 100 mg  Status:  Discontinued     100 mg 50 mL/hr over 60 Minutes Intravenous Every 24 hours 09/20/13 1151 09/20/13 1154   09/10/13 2200  metroNIDAZOLE (FLAGYL) IVPB 500 mg  Status:  Discontinued     500 mg 100 mL/hr over 60  Minutes Intravenous 3 times per day 09/10/13 1802 09/22/13 1118   09/10/13 2000  vancomycin (VANCOCIN) 50 mg/mL oral solution 500 mg  Status:  Discontinued     500 mg Oral 4 times per day 09/10/13 1802 09/21/13 1151   09/10/13 1800  vancomycin (VANCOCIN) 50 mg/mL oral solution 500 mg  Status:  Discontinued     500 mg Oral 4 times per day 09/10/13 1432 09/10/13 1802   09/10/13 1600  metroNIDAZOLE (FLAGYL) IVPB 500 mg  Status:  Discontinued     500 mg 100 mL/hr over 60 Minutes Intravenous 3 times per day 09/10/13 1432 09/10/13 1802   09/09/13 1602  vancomycin (VANCOCIN) 1,250 mg in sodium chloride 0.9 % 250 mL IVPB  Status:  Discontinued     1,250 mg 166.7 mL/hr over 90 Minutes Intravenous Every 24 hours 09/09/13 1057 09/10/13 1432   09/06/13 1600  imipenem-cilastatin (PRIMAXIN) 500 mg in sodium chloride 0.9 % 100 mL IVPB  Status:  Discontinued     500 mg 200 mL/hr over 30 Minutes Intravenous 3 times per day 09/06/13 1310 09/12/13 1048   09/03/13 1100  imipenem-cilastatin (PRIMAXIN) 500 mg in sodium chloride 0.9 % 100 mL IVPB  Status:  Discontinued     500 mg 200 mL/hr over 30 Minutes Intravenous Every 6 hours 09/03/13 1007 09/06/13 1310   09/02/13 1300  vancomycin (VANCOCIN) IVPB 1000 mg/200 mL premix  Status:  Discontinued     1,000 mg 200 mL/hr over 60 Minutes Intravenous Every 12 hours 09/01/13 1527 09/07/13 1340   08/30/13 2000  vancomycin (VANCOCIN) IVPB 750 mg/150 ml premix  Status:  Discontinued     750 mg 150 mL/hr over 60 Minutes Intravenous Every 8 hours 08/30/13 1356 09/01/13 1527   08/29/13 2200  micafungin (MYCAMINE) 100 mg in sodium chloride 0.9 % 100 mL IVPB  Status:  Discontinued     100 mg 100 mL/hr over 1 Hours Intravenous Every 24 hours 08/29/13 2036 09/10/13 1432   08/29/13 0400  vancomycin (VANCOCIN) IVPB 1000 mg/200 mL premix  Status:  Discontinued     1,000 mg 200 mL/hr over 60 Minutes Intravenous Every 8 hours 08/28/13 2358 08/30/13 1356   08/29/13 0400   piperacillin-tazobactam (ZOSYN) IVPB 3.375 g  Status:  Discontinued     3.375 g 12.5 mL/hr over 240 Minutes Intravenous 3 times per day 08/28/13 2358 09/03/13 1002   08/28/13 1845  [MAR Hold]  vancomycin (VANCOCIN) IVPB 1000 mg/200 mL premix     (On MAR Hold since 08/28/13 1957)   1,000 mg 200 mL/hr over 60 Minutes Intravenous  Once 08/28/13 1831 08/28/13 1955   08/28/13 1845  [MAR Hold]  piperacillin-tazobactam (ZOSYN) IVPB 3.375 g     (On MAR Hold since 08/28/13 1957)   3.375 g 100 mL/hr over 30 Minutes Intravenous  Once 08/28/13 1831  08/28/13 2056       Assessment/Plan  Acute respiratory failure-resolved  NSTEMI-ischemia demand, stable, cards follow up  Acute systolic heart failure-echo 08/29/13 with diffuse hypokinesis with EF 40-45%, poor study, recommend repeat. Will need cardiology follow up.  Septic shock 2/2 peritonitis-resolved  AKI-resolved  Acute encephalopathy-resolved  Depression/anxiety -appreciate psych consult.  Resume sertraline 50mg  PO daily when tolerating orals.   Anemia of critical illness-stable  Hypokalemia-resolved   Hx HTN-stable. Continue with metoprolol IV. Slowly titrate metoprolol for tachycardia Perforated gastric ulcer s/p exploratory laparotomy Dr. Andrey Campanile 08/28/13  POD#34  -s/p IR drain placement to RUQ 2/5--culture negative to date. 2 additional surgical drains placed at time of surgery  -white count down today.  We will plan to repeat a CT scan tomorrow. -restarted zosyn 2/4--->2/10.  Started Maxipime 2/10--->. Micafungin 2/6--->. Indication: UGI fistula, multiple intra-abdominal fluid collections.  -appreciate ID assistance  -midline wound has dehisced, continue with BID wet to dry dressing changes  -Continue NGT to suction, TPN, NPO  -VTE prophylaxis; SCDs, lovenox  PCM  -continue with TPN  C. Diff colitis  -treatment completed. Continue with flagyl while on antibiotics to reduce recurrence   Ashok Norris, [name]  Surgery Pager [phone] Office [phone]  10/01/2013 7:43 AM

## 2013-10-01 NOTE — Progress Notes (Signed)
PARENTERAL NUTRITION CONSULT NOTE - FOLLOW UP  Pharmacy Consult for TPN Indication: Prolonged ileus  No Known Allergies  Patient Measurements: Height: '5\' 11"'  (180.3 cm) Weight: 208 lb 15.9 oz (94.8 kg) IBW/kg (Calculated) : 75.3 Vital Signs: Temp: 98.6 F (37 C) (02/11 0559) BP: 123/77 mmHg (02/11 0559) Pulse Rate: 114 (02/11 0559) Intake/Output from previous day: 02/10 0701 - 02/11 0700 In: 2120 [I.V.:120; TPN:2000] Out: 3498 [Urine:3250; Emesis/NG output:150; Drains:98] Intake/Output from this shift: Total I/O In: -  Out: 445 [Urine:445]  Labs:  Recent Labs  09/29/13 0618 09/30/13 0552 10/01/13 0550  WBC 9.7 10.6* 9.7  HGB 10.2* 10.4* 10.2*  HCT 35.5* 31.3* 31.0*  PLT 209 236 229    Recent Labs  09/29/13 0618  NA 135*  K 4.3  CL 103  CO2 21  GLUCOSE 130*  BUN 38*  CREATININE 1.11  CALCIUM 7.7*  MG 1.9  PHOS 4.0  PROT 6.8  ALBUMIN 2.2*  AST 17  ALT 18  ALKPHOS 140*  BILITOT 0.3  PREALBUMIN 21.3  TRIG 214*   Estimated Creatinine Clearance: 94.6 ml/min (by C-G formula based on Cr of 1.11).    Recent Labs  09/30/13 2017 09/30/13 2330 10/01/13 0558  GLUCAP 159* 155* 106*   Insulin Requirements in the past 24 hours:  40 units insulin in TPN + 6 units from SSI during 12 hr cycle  Current Nutrition:  Clinimix E 5/15 2400 mL over 12 hours + lipid emulsion 20% at 240 mL over 12 hr- provides 120 gm protein and 2352 kcal which is 92% protein goals and 100% kcal goals NPO  Nutritional Goals:  2250-2450 kcal and 130-140 gm protein per RD 2/3  Assessment:  48 y.o. male presented to ED 1/8 with abd pain. CT consistent with perforated stomach ulcer due to NSAID use, pneumoperitoneum, peritonitis. Taken emergently to OR where he underwent exp lap, debridement of necrotic stomach, repair of perforated stomach. Contrast study showed no leak from stomach or small bowel.   GI: CT scan 1/31 poss persistent gastric perf, fluid collection 4.5x4.9, and 3rd  collection between bladder and rectum. 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 Will require NPO/NG/TNA for a long time- TPN has been transitioned to cyclic over 12 hours; abdominal drain- 1354m output in past 24 hours. Planning for repeat CT tomorrow per surgery  Endo: No h/o DM. CBGs 96-155 during customized CBG checks, also has 40 units of insulin in bag.   Lytes: Na 135, K 4.3, Mag 1.9, Phso 4, CorCa ~9.1 (2/9)  Heptobili: Alk phos elevated at 140, albumin low at 2.2-, other LFTs wnl. Pre-albumin now WNL at 21.3- good sign of recovering nutritional status  Renal: Scr 1.11, est CrCl ~973mmin; UOP 1.54m66mg/hr in last 24 hours  ID: WBC 9.7, afebrile Vanc 1/8>>1/21 Zosyn 1/8>>1/14 Micafungin 1/9>>1/21; 2/6>> Primaxin 1/14>>1/23  Vanc PO 1/21>>1/31 Flagyl 1/21>> Zosyn 2/4>> Fluconazole 1/31>>2/6  Cultures: 2/6 peritoneal abscess: neg 2/1 peritoneal cavity: few candida glabrata 1/30 urine: Negative 1/21 CDiff Positive 1/16 Trach aspirate: negative 1/9 Blood cultures: negative  TPN Access: triple lumen PICC 1/20; replaced 1/27  TPN day#: 33  Plan:  1. Continue Clinimix E 5/15 + full trace elements and multivitamins + 40 units of regular insulin at cyclic rate- 50 ml/hr x 1 hr, then run at 230 ml/hr x 10 hrs, then run at 50 ml/hr x1 hr for 12 hours total. 20% Lipids to run at 61m19m for 12 hours. 2. Continue 1/2NS at 10-61mL56mONLY when TPN is  not running (0600-1800) 3. CBG checks and moderate SSI correction at 0600/1200/2000/2400 4. Other TPN labs as ordered- full labs tomorrow 5. Follow up repeat CT and d/c planning  Devina Bezold D. Wilberto Console, PharmD, BCPS Clinical Pharmacist Pager: [phone] 10/01/2013 10:58 AM

## 2013-10-01 NOTE — Progress Notes (Signed)
34 Days Post-Op  Subjective: Pt doing ok; still having some loose stools; no sig N/V  Objective: Vital signs in last 24 hours: Temp:  [98.6 F (37 C)-98.8 F (37.1 C)] 98.6 F (37 C) (02/11 0559) Pulse Rate:  [111-120] 114 (02/11 0559) Resp:  [18] 18 (02/11 0559) BP: (117-132)/(76-89) 123/77 mmHg (02/11 0559) SpO2:  [96 %-100 %] 96 % (02/11 0559) Weight:  [208 lb 15.9 oz (94.8 kg)] 208 lb 15.9 oz (94.8 kg) (02/11 0559) Last BM Date: 10/01/13  Intake/Output from previous day: 02/10 0701 - 02/11 0700 In: 2120 [I.V.:120; TPN:2000] Out: 3498 [Urine:3250; Emesis/NG output:150; Drains:98] Intake/Output this shift: Total I/O In: -  Out: 870 [Urine:720; Emesis/NG output:150] R lat abd drain intact, output 60 cc's cream colored fluid; insertion site ok, NT; cx's neg to date  Lab Results:   Recent Labs  09/30/13 0552 10/01/13 0550  WBC 10.6* 9.7  HGB 10.4* 10.2*  HCT 31.3* 31.0*  PLT 236 229   BMET  Recent Labs  09/29/13 0618  NA 135*  K 4.3  CL 103  CO2 21  GLUCOSE 130*  BUN 38*  CREATININE 1.11  CALCIUM 7.7*   PT/INR No results found for this basename: LABPROT, INR,  in the last 72 hours ABG No results found for this basename: PHART, PCO2, PO2, HCO3,  in the last 72 hours  Studies/Results: No results found.  Anti-infectives: Anti-infectives   Start     Dose/Rate Route Frequency Ordered Stop   09/30/13 1500  ceFEPIme (MAXIPIME) 2 g in dextrose 5 % 50 mL IVPB     2 g 100 mL/hr over 30 Minutes Intravenous Every 12 hours 09/30/13 1443     09/26/13 1800  vancomycin (VANCOCIN) 50 mg/mL oral solution 250 mg  Status:  Discontinued     250 mg Oral 4 times per day 09/26/13 1510 09/26/13 1554   09/26/13 1600  metroNIDAZOLE (FLAGYL) IVPB 500 mg     500 mg 100 mL/hr over 60 Minutes Intravenous Every 6 hours 09/26/13 1556     09/26/13 1515  micafungin (MYCAMINE) 100 mg in sodium chloride 0.9 % 100 mL IVPB     100 mg 100 mL/hr over 1 Hours Intravenous Daily 09/26/13  1510     09/24/13 0930  piperacillin-tazobactam (ZOSYN) IVPB 3.375 g  Status:  Discontinued     3.375 g 12.5 mL/hr over 240 Minutes Intravenous 3 times per day 09/24/13 0832 09/30/13 1443   09/22/13 1400  metroNIDAZOLE (FLAGYL) IVPB 500 mg  Status:  Discontinued     500 mg 100 mL/hr over 60 Minutes Intravenous Every 8 hours 09/22/13 1119 09/26/13 1510   09/20/13 1230  fluconazole (DIFLUCAN) IVPB 200 mg  Status:  Discontinued     200 mg 100 mL/hr over 60 Minutes Intravenous Every 24 hours 09/20/13 1154 09/26/13 1510   09/20/13 1200  fluconazole (DIFLUCAN) IVPB 100 mg  Status:  Discontinued     100 mg 50 mL/hr over 60 Minutes Intravenous Every 24 hours 09/20/13 1151 09/20/13 1154   09/10/13 2200  metroNIDAZOLE (FLAGYL) IVPB 500 mg  Status:  Discontinued     500 mg 100 mL/hr over 60 Minutes Intravenous 3 times per day 09/10/13 1802 09/22/13 1118   09/10/13 2000  vancomycin (VANCOCIN) 50 mg/mL oral solution 500 mg  Status:  Discontinued     500 mg Oral 4 times per day 09/10/13 1802 09/21/13 1151   09/10/13 1800  vancomycin (VANCOCIN) 50 mg/mL oral solution 500 mg  Status:  Discontinued     500 mg Oral 4 times per day 09/10/13 1432 09/10/13 1802   09/10/13 1600  metroNIDAZOLE (FLAGYL) IVPB 500 mg  Status:  Discontinued     500 mg 100 mL/hr over 60 Minutes Intravenous 3 times per day 09/10/13 1432 09/10/13 1802   09/09/13 1602  vancomycin (VANCOCIN) 1,250 mg in sodium chloride 0.9 % 250 mL IVPB  Status:  Discontinued     1,250 mg 166.7 mL/hr over 90 Minutes Intravenous Every 24 hours 09/09/13 1057 09/10/13 1432   09/06/13 1600  imipenem-cilastatin (PRIMAXIN) 500 mg in sodium chloride 0.9 % 100 mL IVPB  Status:  Discontinued     500 mg 200 mL/hr over 30 Minutes Intravenous 3 times per day 09/06/13 1310 09/12/13 1048   09/03/13 1100  imipenem-cilastatin (PRIMAXIN) 500 mg in sodium chloride 0.9 % 100 mL IVPB  Status:  Discontinued     500 mg 200 mL/hr over 30 Minutes Intravenous Every 6 hours  09/03/13 1007 09/06/13 1310   09/02/13 1300  vancomycin (VANCOCIN) IVPB 1000 mg/200 mL premix  Status:  Discontinued     1,000 mg 200 mL/hr over 60 Minutes Intravenous Every 12 hours 09/01/13 1527 09/07/13 1340   08/30/13 2000  vancomycin (VANCOCIN) IVPB 750 mg/150 ml premix  Status:  Discontinued     750 mg 150 mL/hr over 60 Minutes Intravenous Every 8 hours 08/30/13 1356 09/01/13 1527   08/29/13 2200  micafungin (MYCAMINE) 100 mg in sodium chloride 0.9 % 100 mL IVPB  Status:  Discontinued     100 mg 100 mL/hr over 1 Hours Intravenous Every 24 hours 08/29/13 2036 09/10/13 1432   08/29/13 0400  vancomycin (VANCOCIN) IVPB 1000 mg/200 mL premix  Status:  Discontinued     1,000 mg 200 mL/hr over 60 Minutes Intravenous Every 8 hours 08/28/13 2358 08/30/13 1356   08/29/13 0400  piperacillin-tazobactam (ZOSYN) IVPB 3.375 g  Status:  Discontinued     3.375 g 12.5 mL/hr over 240 Minutes Intravenous 3 times per day 08/28/13 2358 09/03/13 1002   08/28/13 1845  [MAR Hold]  vancomycin (VANCOCIN) IVPB 1000 mg/200 mL premix     (On MAR Hold since 08/28/13 1957)   1,000 mg 200 mL/hr over 60 Minutes Intravenous  Once 08/28/13 1831 08/28/13 1955   08/28/13 1845  [MAR Hold]  piperacillin-tazobactam (ZOSYN) IVPB 3.375 g     (On MAR Hold since 08/28/13 1957)   3.375 g 100 mL/hr over 30 Minutes Intravenous  Once 08/28/13 1831 08/28/13 2056      Assessment/Plan: s/p rt lat abd fluid collection drainage 2/6; cont current tx; check f/u CT 2/12   LOS: 34 days    Hajar Penninger,D [facility] 10/01/2013

## 2013-10-01 NOTE — Progress Notes (Signed)
Patient ID: James Mccormick, male   DOB: 02/23/1966, 48 y.o.   MRN: [id number]         [facility]    Date of Admission:  08/28/2013    Total days of antibiotics 34        Day 23 metronidazole         Day 9 gram-negative rod therapy, currently on cefepime        Day 6 micafungin Principal Problem:   Intra-abdominal abscess Active Problems:   Perforated gastric ulcer   ARDS (adult respiratory distress syndrome)   Hypocalcemia   Hypophosphatemia   Protein-calorie malnutrition, moderate   Atrial fibrillation   Pleural effusion   C. difficile colitis   Depression   Cigarette smoker   . sodium chloride   Intravenous Q24H  . antiseptic oral rinse  15 mL Mouth Rinse q12n4p  . ceFEPime (MAXIPIME) IV  2 g Intravenous Q12H  . chlorhexidine  15 mL Mouth Rinse BID  . enoxaparin (LOVENOX) injection  40 mg Subcutaneous Q24H  . insulin aspart  0-15 Units Subcutaneous 4 times per day  . metoprolol  5 mg Intravenous Q4H  . metronidazole  500 mg Intravenous Q6H  . micafungin (MYCAMINE) IV  100 mg Intravenous Daily  . pantoprazole (PROTONIX) IV  40 mg Intravenous Q12H  . sodium chloride  10-40 mL Intracatheter Q12H    Subjective: He is feeling better and denies abdominal pain. He had 2 soft, small bowel movements today.   Past Medical History  Diagnosis Date  . GERD (gastroesophageal reflux disease)   . Hypertension   . Depression   . Pleural effusion   . Dysrhythmia     ATRIAL FIBRILATION 08/2013    History  Substance Use Topics  . Smoking status: Current Every Day Smoker -- 1.00 packs/day    Types: Cigarettes  . Smokeless tobacco: Not on file  . Alcohol Use: No    Family History  Problem Relation Age of Onset  . Mental illness Mother   . Hypertension Sister   . Hypertension Brother     No Known Allergies  Objective: Temp:  [98.6 F (37 C)-98.8 F (37.1 C)] 98.7 F (37.1 C) (02/11 1444) Pulse Rate:  [106-120] 106 (02/11 1444) Resp:   [18] 18 (02/11 1444) BP: (117-132)/(76-89) 118/82 mmHg (02/11 1444) SpO2:  [96 %-100 %] 98 % (02/11 1444) Weight:  [94.8 kg (208 lb 15.9 oz)] 94.8 kg (208 lb 15.9 oz) (02/11 0559)  General: He is alert and in no distress visiting with family Skin: No rash Lungs: Clear Cor: Regular S1 and S2 with no murmurs Abdomen: Soft and nontender. Milky white fluid in all 3 drain bulbs   98 cc of drain output yesterday  Lab Results Lab Results  Component Value Date   WBC 9.7 10/01/2013   HGB 10.2* 10/01/2013   HCT 31.0* 10/01/2013   MCV 88.8 10/01/2013   PLT 229 10/01/2013    Lab Results  Component Value Date   CREATININE 1.11 09/29/2013   BUN 38* 09/29/2013   NA 135* 09/29/2013   K 4.3 09/29/2013   CL 103 09/29/2013   CO2 21 09/29/2013    Lab Results  Component Value Date   ALT 18 09/29/2013   AST 17 09/29/2013   ALKPHOS 140* 09/29/2013   BILITOT 0.3 09/29/2013      Microbiology: Recent Results (from the past 240 hour(s))  CULTURE, ROUTINE-ABSCESS     Status: None   Collection Time  09/26/13  4:02 PM      Result Value Ref Range Status   Specimen Description ABSCESS PERITONEAL CAVITY   Final   Special Requests RIGHT LATERAL ABOMINAL   Final   Gram Stain     Final   Value: ABUNDANT WBC PRESENT, PREDOMINANTLY PMN     NO SQUAMOUS EPITHELIAL CELLS SEEN     NO ORGANISMS SEEN     Performed at [facility]   Culture     Final   Value: NO GROWTH 3 DAYS     Performed at [facility]   Report Status 09/29/2013 FINAL   Final    Studies/Results: No results found.  Assessment: I will continue his current antimicrobial regimen pending repeat CT scan tomorrow. Has received nearly 5 weeks of combined antimicrobial therapy for polymicrobial intra-abdominal abscesses. His only recent isolate has been Candida glabrata. I will continue cefepime at least 5 more days and micafungin for at least 8 more days. He will need to stay on IV metronidazole for his C. difficile colitis. 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  Plan: 1. Continue current antimicrobial therapy for now 2. Followup CT scan results tomorrow  Cliffton Asters, MD [facility] [phone] pager   [phone] cell 10/01/2013, 3:46 PM

## 2013-10-02 ENCOUNTER — Inpatient Hospital Stay (HOSPITAL_COMMUNITY): Payer: Medicaid Other

## 2013-10-02 ENCOUNTER — Encounter (HOSPITAL_COMMUNITY): Payer: Self-pay | Admitting: Radiology

## 2013-10-02 LAB — COMPREHENSIVE METABOLIC PANEL
ALT: 15 U/L (ref 0–53)
AST: 16 U/L (ref 0–37)
Albumin: 2.1 g/dL — ABNORMAL LOW (ref 3.5–5.2)
Alkaline Phosphatase: 113 U/L (ref 39–117)
BUN: 29 mg/dL — ABNORMAL HIGH (ref 6–23)
CO2: 20 mEq/L (ref 19–32)
CREATININE: 0.87 mg/dL (ref 0.50–1.35)
Calcium: 7.7 mg/dL — ABNORMAL LOW (ref 8.4–10.5)
Chloride: 100 mEq/L (ref 96–112)
GFR calc Af Amer: >90 mL/min (ref >90)
Glucose, Bld: 99 mg/dL (ref 70–99)
Potassium: 4.4 mEq/L (ref 3.7–5.3)
Sodium: 132 mEq/L — ABNORMAL LOW (ref 137–147)
TOTAL PROTEIN: 6.4 g/dL (ref 6.0–8.3)
Total Bilirubin: 0.3 mg/dL (ref 0.3–1.2)

## 2013-10-02 LAB — GLUCOSE, CAPILLARY
GLUCOSE-CAPILLARY: 95 mg/dL (ref 70–99)
Glucose-Capillary: 118 mg/dL — ABNORMAL HIGH (ref 70–99)
Glucose-Capillary: 93 mg/dL (ref 70–99)
Glucose-Capillary: 134 mg/dL — ABNORMAL HIGH (ref 70–99)

## 2013-10-02 LAB — CBC
HEMATOCRIT: 28.9 % — AB (ref 39.0–52.0)
Hemoglobin: 9.6 g/dL — ABNORMAL LOW (ref 13.0–17.0)
MCH: 29.3 pg (ref 26.0–34.0)
MCHC: 33.2 g/dL (ref 30.0–36.0)
MCV: 88.1 fL (ref 78.0–100.0)
Platelets: 222 10*3/uL (ref 150–400)
RBC: 3.28 MIL/uL — AB (ref 4.22–5.81)
RDW: 15 % (ref 11.5–15.5)
WBC: 9.9 10*3/uL (ref 4.0–10.5)

## 2013-10-02 LAB — PHOSPHORUS: PHOSPHORUS: 3.9 mg/dL (ref 2.3–4.6)

## 2013-10-02 LAB — PROTIME-INR
INR: 1.17 (ref 0.00–1.49)
Prothrombin Time: 14.7 seconds (ref 11.6–15.2)

## 2013-10-02 LAB — MAGNESIUM: Magnesium: 1.9 mg/dL (ref 1.5–2.5)

## 2013-10-02 MED ORDER — LIDOCAINE-EPINEPHRINE 1 %-1:100000 IJ SOLN
INTRAMUSCULAR | Status: AC
  Filled 2013-10-02: qty 1

## 2013-10-02 MED ORDER — FENTANYL CITRATE 0.05 MG/ML IJ SOLN
INTRAMUSCULAR | Status: AC
  Filled 2013-10-02: qty 4

## 2013-10-02 MED ORDER — MIDAZOLAM HCL 2 MG/2ML IJ SOLN
INTRAMUSCULAR | Status: AC | PRN
  Administered 2013-10-02 (×2): 1 mg via INTRAVENOUS

## 2013-10-02 MED ORDER — METOPROLOL TARTRATE 1 MG/ML IV SOLN
10.0000 mg | Freq: Four times a day (QID) | INTRAVENOUS | Status: DC
  Administered 2013-10-02 – 2013-10-03 (×3): 10 mg via INTRAVENOUS
  Filled 2013-10-02 (×5): qty 10

## 2013-10-02 MED ORDER — IOHEXOL 300 MG/ML  SOLN
25.0000 mL | INTRAMUSCULAR | Status: AC
  Administered 2013-10-02 (×2): 25 mL via ORAL

## 2013-10-02 MED ORDER — FAT EMULSION 20 % IV EMUL
240.0000 mL | INTRAVENOUS | Status: AC
  Administered 2013-10-02: 240 mL via INTRAVENOUS
  Filled 2013-10-02 (×2): qty 250

## 2013-10-02 MED ORDER — TRACE MINERALS CR-CU-F-FE-I-MN-MO-SE-ZN IV SOLN
INTRAVENOUS | Status: AC
  Administered 2013-10-02: 18:00:00 via INTRAVENOUS
  Filled 2013-10-02: qty 2400

## 2013-10-02 MED ORDER — IOHEXOL 300 MG/ML  SOLN
100.0000 mL | Freq: Once | INTRAMUSCULAR | Status: AC | PRN
  Administered 2013-10-02: 100 mL via INTRAVENOUS

## 2013-10-02 MED ORDER — FENTANYL CITRATE 0.05 MG/ML IJ SOLN
INTRAMUSCULAR | Status: AC | PRN
  Administered 2013-10-02: 25 ug via INTRAVENOUS
  Administered 2013-10-02: 50 ug via INTRAVENOUS
  Administered 2013-10-02: 25 ug via INTRAVENOUS

## 2013-10-02 MED ORDER — MIDAZOLAM HCL 2 MG/2ML IJ SOLN
INTRAMUSCULAR | Status: AC
  Filled 2013-10-02: qty 4

## 2013-10-02 NOTE — Progress Notes (Signed)
Returned to unit from IR

## 2013-10-02 NOTE — Procedures (Signed)
Interventional Radiology Procedure Note  Procedure: Placement fo 36F drain modified with additional side holes into the perihepatic fluid collection.  320 mL opaque tan colored fluid aspirated.  Sample sent for cx.  Complications: none Recommendations: - JP bulb suction - Follow cx results - Consider repeat CT before tube removal  Signed,  Sterling BigHeath K. Shafin Pollio, MD [facility]

## 2013-10-02 NOTE — Progress Notes (Signed)
PT Cancellation Note  Patient Details Name: Pamalee LeydenChristopher D Perno MRN: [id number] DOB: 05/18/1966   Cancelled Treatment:    Reason Eval/Treat Not Completed: Pain limiting ability to participate. Patient had JP drains placed this afternoon. Asking to hold PT. Will follow up tomorrow   Fredrich BirksRobinette, Djeneba Barsch [name] 10/02/2013, 2:25 PM

## 2013-10-02 NOTE — Progress Notes (Signed)
35 Days Post-Op  Subjective: Pt notes some loose stools from contrast dye.  No N/V, minimal abdominal pain.  Ambulating through the halls.  NPO with NGT.  Had repeat CT scan early this morning.  Having BM's and flatus.  Drains put out 65mL total.  NG out.  Objective: Vital signs in last 24 hours: Temp:  [98.7 F (37.1 C)-99.2 F (37.3 C)] 99.1 F (37.3 C) (02/12 0625) Pulse Rate:  [106-120] 109 (02/12 0625) Resp:  [18] 18 (02/12 0625) BP: (118-133)/(80-85) 133/85 mmHg (02/12 0625) SpO2:  [96 %-98 %] 98 % (02/12 0625) Weight:  [203 lb 11.3 oz (92.4 kg)] 203 lb 11.3 oz (92.4 kg) (02/12 0625) Last BM Date: 10/01/13  Intake/Output from previous day: 02/11 0701 - 02/12 0700 In: 258 [I.V.:253] Out: 3235 [Urine:3020; Emesis/NG output:150; Drains:65] Intake/Output this shift: Total I/O In: -  Out: 11 [Urine:11]  PE: Gen:  Alert, NAD, pleasant Abd: Soft, mild tenderness, +BS, no HSM, has corset dressing on but was able to pull back the superior dressing and see that the wound looks clean with some new granulation tissue forming, drain with frankly purulent yellow/white drainage from all 3 JP drains, no abdominal scars noted   Lab Results:   Recent Labs  10/01/13 0550 10/02/13 0615  WBC 9.7 9.9  HGB 10.2* 9.6*  HCT 31.0* 28.9*  PLT 229 222   BMET  Recent Labs  10/02/13 0615  NA 132*  K 4.4  CL 100  CO2 20  GLUCOSE 99  BUN 29*  CREATININE 0.87  CALCIUM 7.7*   PT/INR No results found for this basename: LABPROT, INR,  in the last 72 hours CMP     Component Value Date/Time   NA 132* 10/02/2013 0615   K 4.4 10/02/2013 0615   CL 100 10/02/2013 0615   CO2 20 10/02/2013 0615   GLUCOSE 99 10/02/2013 0615   BUN 29* 10/02/2013 0615   CREATININE 0.87 10/02/2013 0615   CALCIUM 7.7* 10/02/2013 0615   PROT 6.4 10/02/2013 0615   ALBUMIN 2.1* 10/02/2013 0615   AST 16 10/02/2013 0615   ALT 15 10/02/2013 0615   ALKPHOS 113 10/02/2013 0615   BILITOT 0.3 10/02/2013 0615   GFRNONAA  >90 10/02/2013 0615   GFRAA >90 10/02/2013 0615   Lipase     Component Value Date/Time   LIPASE 14 08/29/2013 0930       Studies/Results: No results found.  Anti-infectives: Anti-infectives   Start     Dose/Rate Route Frequency Ordered Stop   09/30/13 1500  ceFEPIme (MAXIPIME) 2 g in dextrose 5 % 50 mL IVPB     2 g 100 mL/hr over 30 Minutes Intravenous Every 12 hours 09/30/13 1443     09/26/13 1800  vancomycin (VANCOCIN) 50 mg/mL oral solution 250 mg  Status:  Discontinued     250 mg Oral 4 times per day 09/26/13 1510 09/26/13 1554   09/26/13 1600  metroNIDAZOLE (FLAGYL) IVPB 500 mg     500 mg 100 mL/hr over 60 Minutes Intravenous Every 6 hours 09/26/13 1556     09/26/13 1515  micafungin (MYCAMINE) 100 mg in sodium chloride 0.9 % 100 mL IVPB     100 mg 100 mL/hr over 1 Hours Intravenous Daily 09/26/13 1510     09/24/13 0930  piperacillin-tazobactam (ZOSYN) IVPB 3.375 g  Status:  Discontinued     3.375 g 12.5 mL/hr over 240 Minutes Intravenous 3 times per day 09/24/13 0832 09/30/13 1443   09/22/13 1400  metroNIDAZOLE (FLAGYL) IVPB 500 mg  Status:  Discontinued     500 mg 100 mL/hr over 60 Minutes Intravenous Every 8 hours 09/22/13 1119 09/26/13 1510   09/20/13 1230  fluconazole (DIFLUCAN) IVPB 200 mg  Status:  Discontinued     200 mg 100 mL/hr over 60 Minutes Intravenous Every 24 hours 09/20/13 1154 09/26/13 1510   09/20/13 1200  fluconazole (DIFLUCAN) IVPB 100 mg  Status:  Discontinued     100 mg 50 mL/hr over 60 Minutes Intravenous Every 24 hours 09/20/13 1151 09/20/13 1154   09/10/13 2200  metroNIDAZOLE (FLAGYL) IVPB 500 mg  Status:  Discontinued     500 mg 100 mL/hr over 60 Minutes Intravenous 3 times per day 09/10/13 1802 09/22/13 1118   09/10/13 2000  vancomycin (VANCOCIN) 50 mg/mL oral solution 500 mg  Status:  Discontinued     500 mg Oral 4 times per day 09/10/13 1802 09/21/13 1151   09/10/13 1800  vancomycin (VANCOCIN) 50 mg/mL oral solution 500 mg  Status:   Discontinued     500 mg Oral 4 times per day 09/10/13 1432 09/10/13 1802   09/10/13 1600  metroNIDAZOLE (FLAGYL) IVPB 500 mg  Status:  Discontinued     500 mg 100 mL/hr over 60 Minutes Intravenous 3 times per day 09/10/13 1432 09/10/13 1802   09/09/13 1602  vancomycin (VANCOCIN) 1,250 mg in sodium chloride 0.9 % 250 mL IVPB  Status:  Discontinued     1,250 mg 166.7 mL/hr over 90 Minutes Intravenous Every 24 hours 09/09/13 1057 09/10/13 1432   09/06/13 1600  imipenem-cilastatin (PRIMAXIN) 500 mg in sodium chloride 0.9 % 100 mL IVPB  Status:  Discontinued     500 mg 200 mL/hr over 30 Minutes Intravenous 3 times per day 09/06/13 1310 09/12/13 1048   09/03/13 1100  imipenem-cilastatin (PRIMAXIN) 500 mg in sodium chloride 0.9 % 100 mL IVPB  Status:  Discontinued     500 mg 200 mL/hr over 30 Minutes Intravenous Every 6 hours 09/03/13 1007 09/06/13 1310   09/02/13 1300  vancomycin (VANCOCIN) IVPB 1000 mg/200 mL premix  Status:  Discontinued     1,000 mg 200 mL/hr over 60 Minutes Intravenous Every 12 hours 09/01/13 1527 09/07/13 1340   08/30/13 2000  vancomycin (VANCOCIN) IVPB 750 mg/150 ml premix  Status:  Discontinued     750 mg 150 mL/hr over 60 Minutes Intravenous Every 8 hours 08/30/13 1356 09/01/13 1527   08/29/13 2200  micafungin (MYCAMINE) 100 mg in sodium chloride 0.9 % 100 mL IVPB  Status:  Discontinued     100 mg 100 mL/hr over 1 Hours Intravenous Every 24 hours 08/29/13 2036 09/10/13 1432   08/29/13 0400  vancomycin (VANCOCIN) IVPB 1000 mg/200 mL premix  Status:  Discontinued     1,000 mg 200 mL/hr over 60 Minutes Intravenous Every 8 hours 08/28/13 2358 08/30/13 1356   08/29/13 0400  piperacillin-tazobactam (ZOSYN) IVPB 3.375 g  Status:  Discontinued     3.375 g 12.5 mL/hr over 240 Minutes Intravenous 3 times per day 08/28/13 2358 09/03/13 1002   08/28/13 1845  [MAR Hold]  vancomycin (VANCOCIN) IVPB 1000 mg/200 mL premix     (On MAR Hold since 08/28/13 1957)   1,000 mg 200 mL/hr  over 60 Minutes Intravenous  Once 08/28/13 1831 08/28/13 1955   08/28/13 1845  [MAR Hold]  piperacillin-tazobactam (ZOSYN) IVPB 3.375 g     (On MAR Hold since 08/28/13 1957)   3.375 g 100 mL/hr over 30  Minutes Intravenous  Once 08/28/13 1831 08/28/13 2056       Assessment/Plan Perforated gastric ulcer s/p exploratory laparotomy Dr. Andrey CampanileWilson 08/28/13  POD#35 -s/p IR drain placement to RUQ 2/5--culture negative to date. 2 additional surgical drains placed at time of surgery  -white count normal today. We will plan to repeat CT scan shows drained fluid collections in the pelvis, but fluid collection over the dome of the liver is still there.  Will have IR see if they can drain this collection.  Stomach still with evidence of active extravasation. -Aosyn 2/4--->2/10. Started Maxipime 2/10--->. Micafungin 2/6--->. Indication: UGI fistula, multiple intra-abdominal fluid collections.  -appreciate ID assistance  -midline wound has dehisced, continue with BID wet to dry dressing changes  -Continue NGT to suction, TPN, NPO  -VTE prophylaxis; SCDs, lovenox  PCM -continue with TPN  C. Diff colitis -treatment completed. Continue with flagyl while on antibiotics to reduce recurrence  Acute respiratory failure-resolved  NSTEMI-ischemia demand, stable, cards follow up  Acute systolic heart failure-echo 08/29/13 with diffuse hypokinesis with EF 40-45%, poor study, recommend repeat. Will need cardiology follow up.  Septic shock 2/2 peritonitis-resolved  AKI-resolved  Acute encephalopathy-resolved  Depression/anxiety -appreciate psych consult. Resume sertraline 50mg  PO daily when tolerating orals.  Anemia of critical illness-stable  Hypokalemia-resolved  Hx HTN-stable. Continue with metoprolol IV. Slowly titrate metoprolol for tachycardia 10mg  q6h    LOS: 35 days    DORT, Brandalynn Ofallon 10/02/2013, 7:50 AM Pager: [phone]

## 2013-10-02 NOTE — Progress Notes (Signed)
PARENTERAL NUTRITION CONSULT NOTE - FOLLOW UP  Pharmacy Consult for TPN Indication: Prolonged ileus  No Known Allergies  Patient Measurements: Height: 5\' 11"  (180.3 cm) Weight: 203 lb 11.3 oz (92.4 kg) IBW/kg (Calculated) : 75.3 Vital Signs: Temp: 98.7 F (37.1 C) (02/12 0800) Temp src: Oral (02/12 0800) BP: 123/84 mmHg (02/12 0800) Pulse Rate: 110 (02/12 0800) Intake/Output from previous day: 02/11 0701 - 02/12 0700 In: 258 [I.V.:253] Out: 3235 [Urine:3020; Emesis/NG output:150; Drains:65] Intake/Output from this shift: Total I/O In: -  Out: 11 [Urine:11]  Labs:  Recent Labs  09/30/13 0552 10/01/13 0550 10/02/13 0615  WBC 10.6* 9.7 9.9  HGB 10.4* 10.2* 9.6*  HCT 31.3* 31.0* 28.9*  PLT 236 229 222    Recent Labs  10/02/13 0615  NA 132*  K 4.4  CL 100  CO2 20  GLUCOSE 99  BUN 29*  CREATININE 0.87  CALCIUM 7.7*  MG 1.9  PHOS 3.9  PROT 6.4  ALBUMIN 2.1*  AST 16  ALT 15  ALKPHOS 113  BILITOT 0.3   Estimated Creatinine Clearance: 119.3 ml/min (by C-G formula based on Cr of 0.87).    Recent Labs  10/01/13 1948 10/01/13 2352 10/02/13 0625  GLUCAP 144* 146* 95   Insulin Requirements in the past 24 hours:  40 units insulin in TPN + 2 units from SSI  Current Nutrition:  Clinimix E 5/15 2400 mL over 12 hours + lipid emulsion 20% at 240 mL over 12 hr- provides 120 gm protein and 2352 kcal which is 92% protein goals and 100% kcal goals NPO  Nutritional Goals:  2250-2450 kcal and 130-140 gm protein per RD 2/3  Assessment:  48 y.o. male presented to ED 1/8 with abd pain. CT consistent with perforated stomach ulcer due to NSAID use, pneumoperitoneum, peritonitis. Taken emergently to OR where he underwent exp lap, debridement of necrotic stomach, repair of perforated stomach. Contrast study showed no leak from stomach or small bowel.   GI: CT scan 1/31 poss persistent gastric perf, fluid collection 4.5x4.9, and 3rd collection between bladder and  rectum. 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 Will require NPO/NG/TNA for a long time- TPN has been transitioned to cyclic over 12 hours; abdominal drain- 65mL output in past 24 hours, NGT 150mL out. Repeat CT done this morning  Endo: No h/o DM. CBGs 95-146 during customized CBG checks, also has 40 units of insulin in bag.   Lytes: Na 132, K 4.4, Mag 1.9, Phso 3.9, CorCa 9.1  Heptobili: LFTs wnl except albumin which remains low at 2.1. Pre-albumin now WNL at 21.3- good sign of recovering nutritional status; trigs 214  Renal: Scr 0.87- improved, est CrCl >13100mL/min; UOP 1.244mL/kg/hr in last 24 hours  ID: WBC 9.7, afebrile; per ID note, cefepime to continue for at least 5 more days, micafungin for at least 8 more days, and to remain on IV Flagyl Vanc 1/8>>1/21 Zosyn 1/8>>1/14 Micafungin 1/9>>1/21; 2/6>> Primaxin 1/14>>1/23  Vanc PO 1/21>>1/31 Flagyl 1/21>> Zosyn 2/4>> Fluconazole 1/31>>2/6  Cultures: 2/6 peritoneal abscess: neg 2/1 peritoneal cavity: few candida glabrata 1/30 urine: Negative 1/21 CDiff Positive 1/16 Trach aspirate: negative 1/9 Blood cultures: negative  TPN Access: triple lumen PICC 1/20; replaced 1/27  TPN day#: 34  Plan:  1. Continue Clinimix E 5/15 + full trace elements and multivitamins + 40 units of regular insulin at cyclic rate- 50 ml/hr x 1 hr, then run at 230 ml/hr x 10 hrs, then run at 50 ml/hr x1 hr for 12 hours  total. 20% Lipids to run at 63mL/hr for 12 hours. 2. Continue 1/2NS at 10-49mL/hr ONLY when TPN is not running (0600-1800) 3. CBG checks and moderate SSI correction at 0600/1200/2000/2400 4. Other TPN labs as ordered 5. Follow up repeat CT and d/c planning  Henery Betzold D. Howie Rufus, PharmD, BCPS Clinical Pharmacist Pager: [phone] 10/02/2013 9:52 AM

## 2013-10-02 NOTE — Progress Notes (Signed)
Appreciate IR assistance. New drain right upper quadrant functioning well. No further wound bleeding.  Late documentation: Yesterday at 5 PM patient had a small area of bleeding on the granulation tissue of his wound bed on the right middle portion. I treated this locally with silver nitrate with good results. Spoke with his daughter at that time.  Patient examined and I agree with the assessment and plan  Violeta GelinasBurke Aaryanna Hyden, MD, MPH, FACS Pager: [phone]  10/02/2013 4:58 PM

## 2013-10-02 NOTE — H&P (Signed)
James Mccormick is an 48 y.o. male.   Chief Complaint: gastric ulcer perforation Post surgical intervention 08/28/13 3 separate JP drains in place 2 surgical; 1 from IR IR placed Rt lat abd drain 2/6 CT 2/11 reveals resolving collections with drains in place Collection perihepatic without resolution Request has been made per surgery-to aspirate fluid collection here- place drain if infectious  HPI: GERD; HTN; afib  Past Medical History  Diagnosis Date  . GERD (gastroesophageal reflux disease)   . Hypertension   . Depression   . Pleural effusion   . Dysrhythmia     ATRIAL FIBRILATION 08/2013    Past Surgical History  Procedure Laterality Date  . Neck surgery    . Laparotomy N/A 08/28/2013    Procedure: EXPLORATORY LAPAROTOMY,debridment of nacrotic stomach,and primary repair of perforated stomach.;  Surgeon: Gayland Curry, MD;  Location: [facility] OR;  Service: General;  Laterality: N/A;    Family History  Problem Relation Age of Onset  . Mental illness Mother   . Hypertension Sister   . Hypertension Brother    Social History:  reports that he has been smoking Cigarettes.  He has been smoking about 1.00 pack per day. He does not have any smokeless tobacco history on file. He reports that he does not drink alcohol or use illicit drugs.  Allergies: No Known Allergies  Medications Prior to Admission  Medication Sig Dispense Refill  . LORazepam (ATIVAN) 0.5 MG tablet Take 0.5 mg by mouth daily as needed for anxiety.      Marland Kitchen omeprazole (PRILOSEC) 20 MG capsule Take 20 mg by mouth daily.      . sertraline (ZOLOFT) 50 MG tablet Take 50 mg by mouth 3 (three) times daily.      . valsartan (DIOVAN) 320 MG tablet Take 320 mg by mouth daily.        Results for orders placed during the hospital encounter of 08/28/13 (from the past 48 hour(s))  GLUCOSE, CAPILLARY     Status: None   Collection Time    09/30/13 12:26 PM      Result Value Ref Range   Glucose-Capillary 88  70 - 99 mg/dL   GLUCOSE, CAPILLARY     Status: Abnormal   Collection Time    09/30/13  8:17 PM      Result Value Ref Range   Glucose-Capillary 159 (*) 70 - 99 mg/dL   Comment 1 Notify RN     Comment 2 Documented in Chart    GLUCOSE, CAPILLARY     Status: Abnormal   Collection Time    09/30/13 11:30 PM      Result Value Ref Range   Glucose-Capillary 155 (*) 70 - 99 mg/dL   Comment 1 Notify RN    CBC     Status: Abnormal   Collection Time    10/01/13  5:50 AM      Result Value Ref Range   WBC 9.7  4.0 - 10.5 K/uL   RBC 3.49 (*) 4.22 - 5.81 MIL/uL   Hemoglobin 10.2 (*) 13.0 - 17.0 g/dL   HCT 31.0 (*) 39.0 - 52.0 %   MCV 88.8  78.0 - 100.0 fL   MCH 29.2  26.0 - 34.0 pg   MCHC 32.9  30.0 - 36.0 g/dL   RDW 15.1  11.5 - 15.5 %   Platelets 229  150 - 400 K/uL  GLUCOSE, CAPILLARY     Status: Abnormal   Collection Time    10/01/13  5:58 AM      Result Value Ref Range   Glucose-Capillary 106 (*) 70 - 99 mg/dL   Comment 1 Notify RN     Comment 2 Documented in Chart    GLUCOSE, CAPILLARY     Status: Abnormal   Collection Time    10/01/13 12:49 PM      Result Value Ref Range   Glucose-Capillary 107 (*) 70 - 99 mg/dL  GLUCOSE, CAPILLARY     Status: Abnormal   Collection Time    10/01/13  7:48 PM      Result Value Ref Range   Glucose-Capillary 144 (*) 70 - 99 mg/dL   Comment 1 Notify RN    GLUCOSE, CAPILLARY     Status: Abnormal   Collection Time    10/01/13 11:52 PM      Result Value Ref Range   Glucose-Capillary 146 (*) 70 - 99 mg/dL   Comment 1 Notify RN    COMPREHENSIVE METABOLIC PANEL     Status: Abnormal   Collection Time    10/02/13  6:15 AM      Result Value Ref Range   Sodium 132 (*) 137 - 147 mEq/L   Potassium 4.4  3.7 - 5.3 mEq/L   Chloride 100  96 - 112 mEq/L   CO2 20  19 - 32 mEq/L   Glucose, Bld 99  70 - 99 mg/dL   BUN 29 (*) 6 - 23 mg/dL   Creatinine, Ser 0.87  0.50 - 1.35 mg/dL   Calcium 7.7 (*) 8.4 - 10.5 mg/dL   Total Protein 6.4  6.0 - 8.3 g/dL   Albumin 2.1 (*)  3.5 - 5.2 g/dL   AST 16  0 - 37 U/L   ALT 15  0 - 53 U/L   Alkaline Phosphatase 113  39 - 117 U/L   Total Bilirubin 0.3  0.3 - 1.2 mg/dL   GFR calc non Af Amer >90  >90 mL/min   GFR calc Af Amer >90  >90 mL/min   Comment: (NOTE)     The eGFR has been calculated using the CKD EPI equation.     This calculation has not been validated in all clinical situations.     eGFR's persistently <90 mL/min signify possible Chronic Kidney     Disease.  MAGNESIUM     Status: None   Collection Time    10/02/13  6:15 AM      Result Value Ref Range   Magnesium 1.9  1.5 - 2.5 mg/dL  PHOSPHORUS     Status: None   Collection Time    10/02/13  6:15 AM      Result Value Ref Range   Phosphorus 3.9  2.3 - 4.6 mg/dL  CBC     Status: Abnormal   Collection Time    10/02/13  6:15 AM      Result Value Ref Range   WBC 9.9  4.0 - 10.5 K/uL   RBC 3.28 (*) 4.22 - 5.81 MIL/uL   Hemoglobin 9.6 (*) 13.0 - 17.0 g/dL   HCT 28.9 (*) 39.0 - 52.0 %   MCV 88.1  78.0 - 100.0 fL   MCH 29.3  26.0 - 34.0 pg   MCHC 33.2  30.0 - 36.0 g/dL   RDW 15.0  11.5 - 15.5 %   Platelets 222  150 - 400 K/uL  GLUCOSE, CAPILLARY     Status: None   Collection Time    10/02/13  6:25 AM  Result Value Ref Range   Glucose-Capillary 95  70 - 99 mg/dL   Comment 1 Notify RN     Ct Abdomen Pelvis W Contrast  10/02/2013   CLINICAL DATA:  Intra-abdominal fluid collections.  EXAM: CT ABDOMEN AND PELVIS WITH CONTRAST  TECHNIQUE: Multidetector CT imaging of the abdomen and pelvis was performed using the standard protocol following bolus administration of intravenous contrast.  CONTRAST:  151m OMNIPAQUE IOHEXOL 300 MG/ML  SOLN  COMPARISON:  CT IMAGE GUIDED DRAINAGE BY PERCUTANEOUS CATHETER dated 09/26/2013; CT ABD/PELVIS W CM dated 09/25/2013; DG NASO G TUBE PLC W/FL W/RAD dated 09/21/2013  FINDINGS: Trace left pleural effusion. Moderate atelectasis in the right lower lobe. Emphysema noted with a collection a bulla in the lingula. There is a density  in the distal esophagus which could reflect the tip of a nasogastric tube -consider chest radiography.  Considerable ascites below the right hemidiaphragm and above the liver. Again noted is a gaping bowl are wound with 2 drainage tubes in during the upper abdomen and coiling in the vicinity of the upper omentum, with scattered collections of gas and fluid in this vicinity. For the most part this gas is in scattered thin walled collections. There is more extraluminal gas in the left upper quadrant and was previously present. Complex perisplenic fluid is similar to prior. A relatively thin walled collection of fluid in the splenic hilum measures 5.4 x 4.6 cm on image 36 of series 2 (formerly 5.5 x 4.2 cm by my measurements). There is both an infiltrative and fluid density nodular component of the omentum.  Several small fluid collections are present along the inferior margin of the stomach. An index collection which previously measured 1.7 x 2.3 cm currently measures 1.8 x 3.0 cm, and a second index collection previously measuring 1.9 x 3.2 cm currently measures 2.2 x 3.4 cm.  Pancreas, adrenal glands, and right kidney unremarkable. There is a 2 mm left kidney lower pole nonobstructive calculus.  There appears to be oral contrast medium in the collection of fluid and gas above the spleen. Scrolling through images 40 through 60 of series [id number] I am struck by the possibility of a continued gastric perforation along the proximal portion of the lesser curvature, with fistula or contained perforation connecting to this left upper quadrant collection which includes orally administered contrast medium. There was previously oral contrast in this vicinity, and accordingly some of this may be residual, but I suspect that this probably represents continued contained gastric perforation  A right upper quadrant drain is present. This is successfully drain the fluid collection previously tracking along the right pericolic  gutter, but could does not appear to have effectively drained the fluid collection above the liver, which may be loculated. Mild gallbladder wall thickening.  Several small extraluminal collections of fluid are present tracking along loops of bowel, in the left pericolic gutter, along the right anterior peritoneal margin, and in the pelvis. The extraluminal collections in the pelvis demonstrate only minimal internal gas density, less than prior.  Appendix unremarkable. Spondylosis and degenerative disc disease at L4-5 and L5-S1 with suspected left foraminal stenosis at both of these levels. Bony prominence along the right medial femoral neck may be from an osteochondroma or deformity from an old fracture.  Sigmoid diverticulosis.  IMPRESSION: 1. The right upper quadrant drain has successfully trend the fluid collection along the inferior right hepatic lobe margin and pericolic gutter, but has not drained the fluid collection along the dome of the liver.  2. Defect in the proximal lesser curvature of the stomach wall, with suspected active/current perforation in this vicinity and a collection of gas and fluid extending into the left upper quadrant above the spleen where there is also oral contrast medium suspicious for continued extravasation from the stomach. 3. Mildly increased extraluminal gas in the left upper quadrant. Scattered infiltrative gas and fluid throughout the omentum, with pockets of fluid scattered in the abdomen and pelvis. Of note, the drained fluid collection along the inferior right hepatic lobe is nearly resolved. However, the loculated collection above the right hepatic lobe is still present. 4. Left foraminal stenosis at L4-5 and L5-S1 due to spondylosis. 5. Bony prominence along the right medial femoral neck may be from osteochondroma or deformity related to an old fracture. 6. Trace left pleural effusion with moderate atelectasis in the right lower lobe. 7. Emphysema.   Electronically Signed    By: Sherryl Barters M.D.   On: 10/02/2013 08:00    Review of Systems  Constitutional: Positive for weight loss. Negative for fever.  Respiratory: Negative for shortness of breath.   Cardiovascular: Negative for chest pain.  Gastrointestinal: Positive for abdominal pain and diarrhea. Negative for vomiting.  Neurological: Positive for weakness.    Blood pressure 123/84, pulse 110, temperature 98.7 F (37.1 C), temperature source Oral, resp. rate 16, height '5\' 11"'  (1.803 m), weight 203 lb 11.3 oz (92.4 kg), SpO2 96.00%. Physical Exam  Constitutional: He is oriented to person, place, and time. He appears well-developed.  Cardiovascular: Normal rate and regular rhythm.   No murmur heard. Respiratory: Effort normal. He has no wheezes.  GI: Soft. There is tenderness.  Large mid surgical wound 3 JP drains in place  Musculoskeletal: Normal range of motion.  Neurological: He is alert and oriented to person, place, and time.  Psychiatric: He has a normal mood and affect. His behavior is normal. Judgment and thought content normal.     Assessment/Plan Perihepatic fluid collection remains on CT 2/11 scheduled now for aspiration vs drain placement Pt aware of procedure benefits and risks and agreeable to proceed Consent signed and in chart  Adeleigh Barletta A 10/02/2013, 10:26 AM

## 2013-10-02 NOTE — Progress Notes (Signed)
NUTRITION FOLLOW UP  Intervention:    TPN per pharmacy  Diet advancement per MD discretion RD to follow for nutrition care plan  Nutrition Dx:   Inadequate oral intake now related to inability to eat as evidenced by NPO status, ongoing  Goal:   Pt to meet >/= 90% of their estimated nutrition needs, met  Monitor:   TPN prescription, PO diet advancement, weight, labs, I/O's  Assessment:   Patient with PMH of GERD, HTN presented with severe abdominal pain associated with nausea & vomiting; had been taking Alleve and large amount of Goody's powder.   CT scan revealed perforated stomach ulcer due to NSAID use.   Patient s/p procedures 1/8:  EXPLORATORY LAPAROTOMY  DEBRIDEMENT OF NECROTIC STOMACH  REPAIR OF PERFORATED STOMACH   Patient tested positive for C difficile 1/21. IV Flagyl & PO Vancomycin added.  Patient s/p FEES 1/23.  Demonstrated mild dysphagia.  Advanced to Clear Liquids 1/27, Full Liquids 1/28. Patient diet advanced to dysphagia 3, thin liquids 1/29.  Patient's current diet is NPO as of 1/31. 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  Patient continues to receive cyclic TPN with Clinimix E 5/15 + lipids over 12 hours to provide 2400 ml. Provides 2184 kcal, 120 gm protein daily. Meets 97% minimum estimated energy needs and 92% minimum estimated protein needs. Weight has been fairly stable since 2/03. Pt has drains and NGT in place. Per MD note drains put out 65 ml and NG put out 150 ml. Pt having loose BM's.  Pt remains NPO. Stage 2 pressure ulcer on posterior head. Per radiology note, Perihepatic fluid collection remains on CT 2/11, scheduled now for aspiration vs drain placement.    Labs reviewed. Noted last prealbumin WNL. Triglycerides elevated 2/09. Potassium, magnesium, and phosphorus are WNL.  Sodium, calcium, and hemoglobin are low.    Height: 08/28/13  _0  (1.803 m)   Weight Status:   Wt Readings from Last 1 Encounters:  10/02/13 203 lb 11.3 oz (92.4 kg)  02/06  206 lb 02/03  200 lb 01/28  215 lb 01/27  220 lb 01/26   220 lb 01/24   221 lb  01/23   222 lb  01/22   230 lb  01/21   233 lb  01/20   252 lb  01/19   261 lb  01/18   276 lb  01/17   278 lb  01/16   279 lb  01/12   262 lb   Admission weight: 225 lb  Body mass index is 28.42 kg/(m^2).   Re-estimated needs:  Kcal: 2250-2450 Protein: 130-140 gm Fluid: 2.2-2.4 L  Skin: abdominal surgical incision; 3 closed system drains; +1 generalized edema, +1 RLE and LLE edema, +1perineal edema; stage 2 pressure ulcer on posterior head  Diet Order: NPO    Intake/Output Summary (Last 24 hours) at 10/02/13 1042 Last data filed at 10/02/13 1013  Gross per 24 hour  Intake    258 ml  Output   3101 ml  Net  -2843 ml    Last BM: 2/11, loose  Labs:   Recent Labs Lab 09/26/13 0600 09/29/13 0618 10/02/13 0615  NA 135* 135* 132*  K 4.6 4.3 4.4  CL 103 103 100  CO2 _1 BUN 44* 38* 29*  CREATININE 1.21 1.11 0.87  CALCIUM 8.0* 7.7* 7.7*  MG  --  1.9 1.9  PHOS  --  4.0 3.9  GLUCOSE 130* 130* 99    CBG (last 3)   Recent Labs  10/01/13 1948 10/01/13 2352 10/02/13 0625  GLUCAP 144* 146* 95    Scheduled Meds: . sodium chloride   Intravenous Q24H  . antiseptic oral rinse  15 mL Mouth Rinse q12n4p  . ceFEPime (MAXIPIME) IV  2 g Intravenous Q12H  . chlorhexidine  15 mL Mouth Rinse BID  . enoxaparin (LOVENOX) injection  40 mg Subcutaneous Q24H  . insulin aspart  0-15 Units Subcutaneous 4 times per day  . metoprolol  10 mg Intravenous 4 times per day  . metronidazole  500 mg Intravenous Q6H  . micafungin (MYCAMINE) IV  100 mg Intravenous Daily  . pantoprazole (PROTONIX) IV  40 mg Intravenous Q12H  . sodium chloride  10-40 mL Intracatheter Q12H    Continuous Infusions: . Marland KitchenTPN  (CLINIMIX-E) Adult     And  . fat emulsion 240 mL (10/01/13 1752)  . Marland KitchenTPN (CLINIMIX-E) Adult     And  . fat emulsion      Pryor Ochoa RD, LDN Inpatient Clinical Dietitian Pager: [phone] After Hours Pager: [phone]

## 2013-10-03 LAB — GLUCOSE, CAPILLARY
GLUCOSE-CAPILLARY: 111 mg/dL — AB (ref 70–99)
GLUCOSE-CAPILLARY: 87 mg/dL (ref 70–99)
Glucose-Capillary: 155 mg/dL — ABNORMAL HIGH (ref 70–99)
Glucose-Capillary: 105 mg/dL — ABNORMAL HIGH (ref 70–99)
Glucose-Capillary: 175 mg/dL — ABNORMAL HIGH (ref 70–99)

## 2013-10-03 MED ORDER — METOPROLOL TARTRATE 1 MG/ML IV SOLN
7.5000 mg | INTRAVENOUS | Status: DC
  Administered 2013-10-03 – 2013-10-14 (×69): 7.5 mg via INTRAVENOUS
  Filled 2013-10-03 (×76): qty 10

## 2013-10-03 MED ORDER — FAT EMULSION 20 % IV EMUL
250.0000 mL | INTRAVENOUS | Status: AC
  Administered 2013-10-03: 250 mL via INTRAVENOUS
  Filled 2013-10-03 (×2): qty 250

## 2013-10-03 MED ORDER — TRACE MINERALS CR-CU-F-FE-I-MN-MO-SE-ZN IV SOLN
INTRAVENOUS | Status: AC
  Administered 2013-10-03: 18:00:00 via INTRAVENOUS
  Filled 2013-10-03: qty 2400

## 2013-10-03 NOTE — Progress Notes (Signed)
PARENTERAL NUTRITION CONSULT NOTE - FOLLOW UP  Pharmacy Consult for TPN Indication: Prolonged ileus  No Known Allergies  Patient Measurements: Height: 5\' 11"  (180.3 cm) Weight: 200 lb 6.4 oz (90.9 kg) IBW/kg (Calculated) : 75.3 Adjusted Body Weight:  Usual Weight:   Vital Signs: Temp: 98.6 F (37 C) (02/13 0502) Temp src: Axillary (02/13 0502) BP: 129/81 mmHg (02/13 0502) Pulse Rate: 119 (02/13 0502) Intake/Output from previous day: 02/12 0701 - 02/13 0700 In: 3375 [P.O.:175; I.V.:320; TPN:2880] Out: 2851 [Urine:2261; Emesis/NG output:100; Drains:490] Intake/Output from this shift:    Labs:  Recent Labs  10/01/13 0550 10/02/13 0615 10/02/13 1205  WBC 9.7 9.9  --   HGB 10.2* 9.6*  --   HCT 31.0* 28.9*  --   PLT 229 222  --   INR  --   --  1.17     Recent Labs  10/02/13 0615  NA 132*  K 4.4  CL 100  CO2 20  GLUCOSE 99  BUN 29*  CREATININE 0.87  CALCIUM 7.7*  MG 1.9  PHOS 3.9  PROT 6.4  ALBUMIN 2.1*  AST 16  ALT 15  ALKPHOS 113  BILITOT 0.3   Estimated Creatinine Clearance: 118.4 ml/min (by C-G formula based on Cr of 0.87).    Recent Labs  10/02/13 1951 10/03/13 0021 10/03/13 0549  GLUCAP 134* 155* 111*    Medications:  Scheduled:  . sodium chloride   Intravenous Q24H  . antiseptic oral rinse  15 mL Mouth Rinse q12n4p  . ceFEPime (MAXIPIME) IV  2 g Intravenous Q12H  . chlorhexidine  15 mL Mouth Rinse BID  . enoxaparin (LOVENOX) injection  40 mg Subcutaneous Q24H  . insulin aspart  0-15 Units Subcutaneous 4 times per day  . metoprolol  7.5 mg Intravenous Q4H  . metronidazole  500 mg Intravenous Q6H  . micafungin (MYCAMINE) IV  100 mg Intravenous Daily  . pantoprazole (PROTONIX) IV  40 mg Intravenous Q12H  . sodium chloride  10-40 mL Intracatheter Q12H    Insulin Requirements in the past 24 hours:  40 units insulin in TPN + 3 units mod SSI   Current Nutrition:  Clinimix E 5/15 2400 mL over 12 hours + lipid emulsion 20% at 240 mL  over 12 hr- provides 120 gm protein and 2352 kcal which is 92% protein goals and 100% kcal goals  NPO   Nutritional Goals:  2250-2450 kcal and 130-140 gm protein per RD 2/3   Assessment:  48 y.o. male presented to ED 1/8 with abd pain. CT consistent with perforated stomach ulcer due to NSAID use, pneumoperitoneum, peritonitis. Taken emergently to OR where he underwent exp lap, debridement of necrotic stomach, repair of perforated stomach. Contrast study showed no leak from stomach or small bowel.   GI: CT scan 1/31 poss persistent gastric perf, fluid collection 4.5x4.9, and 3rd collection between bladder and rectum. 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 Will require NPO/NG/TNA for a long time- TPN has been transitioned to cyclic over 12 hours; abdominal drain- output in past 24 hours, NGT out. S/P CT guided asp'n of perihepatic fluid 2/13 -> , concerning for infxn & sent for cx.  Loose BMs, abd pain controlled.  Endo: No h/o DM. CBGs 93, 111 off TPN & 134, 155 on TPN. 40 units of insulin in bag.   Lytes:   Na 132, K 4.4, Mag 1.9, Phso 3.9, CorCa 9.1; no labs today  Heptobili: LFTs wnl except albumin which remains low  at 2.1. Pre-albumin now WNL at 21.3- good sign of recovering nutritional status; trigs 214   Renal:  2/12- Scr 0.87- improved, est CrCl >17800mL/min; UOP 741mL/kg/hr in last 24 hours   ID:  WBC 9.9, afebrile; per ID note, cefepime to continue for at least 5 more days, micafungin for at least 8 more days, and to remain on IV Flagyl while on abx to prevent recurrence of CDiff.  Vanc 1/8>>1/21 Zosyn 1/8>>1/14 Micafungin 1/9>>1/21; 2/6>> Primaxin 1/14>>1/23  Vanc PO 1/21>>1/31  Flagyl 1/21 >>  Zosyn 2/4 >>  Fluconazole 1/31>>2/6   Cultures:  2/12 intra-abd fluid: NTD 2/6 peritoneal abscess: NF  2/1 peritoneal cavity: few candida glabrata  1/30 urine: NF  1/21 CDiff Positive  1/16 Trach aspirate: NF  1/9 Blood cultures: NF  TPN Access: triple lumen  PICC 1/20; replaced 1/27  TPN day#: 35   Plan:  1. Continue Clinimix E 5/15 + full trace elements and multivitamins + 40 units of regular insulin at cyclic rate- 50 ml/hr x 1 hr, then run at 230 ml/hr x 10 hrs, then run at 50 ml/hr x1 hr for 12 hours total. 20% Lipids to run at 1420mL/hr for 12 hours.  2. Continue 1/2NS at 10-4720mL/hr ONLY when TPN is not running (0600-1800)  3. CBG checks and moderate SSI correction at 0600/1200/2000/2400  4. Other TPN labs as ordered  5. Follow up culture results   Marisue HumbleKendra Amarie Tarte, PharmD Clinical Pharmacist  [facility]

## 2013-10-03 NOTE — Progress Notes (Signed)
36 Days Post-Op  Subjective: Perforated gastric ulcer Surgical intervention 08/28/13 IR placed Rt lat abd abscess drain 2/6 Placed perihepatic abscess drain 2/12 Pt feels better today  Objective: Vital signs in last 24 hours: Temp:  [98.4 F (36.9 C)-98.8 F (37.1 C)] 98.6 F (37 C) (02/13 0502) Pulse Rate:  [93-119] 119 (02/13 0502) Resp:  [12-22] 18 (02/13 0502) BP: (117-135)/(73-94) 129/81 mmHg (02/13 0502) SpO2:  [96 %-100 %] 97 % (02/13 0502) Weight:  [90.9 kg (200 lb 6.4 oz)] 90.9 kg (200 lb 6.4 oz) (02/13 0500) Last BM Date: 10/02/13  Intake/Output from previous day: 02/12 0701 - 02/13 0700 In: 3375 [P.O.:175; I.V.:320; TPN:2880] Out: 2851 [Urine:2261; Emesis/NG output:100; Drains:490] Intake/Output this shift:    PE:  Afeb; vss Wbc wnl Rt lat abs drain intact Output milky yellow: 45 cc yesterday- 10cc in JP now Perihepatic drain intact Output sl bloody and milky: 425 cc yesterday; 10 cc in JP Sites clean and dry; sl tender   Lab Results:   Recent Labs  10/01/13 0550 10/02/13 0615  WBC 9.7 9.9  HGB 10.2* 9.6*  HCT 31.0* 28.9*  PLT 229 222   BMET  Recent Labs  10/02/13 0615  NA 132*  K 4.4  CL 100  CO2 20  GLUCOSE 99  BUN 29*  CREATININE 0.87  CALCIUM 7.7*   PT/INR  Recent Labs  10/02/13 1205  LABPROT 14.7  INR 1.17   ABG No results found for this basename: PHART, PCO2, PO2, HCO3,  in the last 72 hours  Studies/Results: Ct Abdomen Pelvis W Contrast  10/02/2013   CLINICAL DATA:  Intra-abdominal fluid collections.  EXAM: CT ABDOMEN AND PELVIS WITH CONTRAST  TECHNIQUE: Multidetector CT imaging of the abdomen and pelvis was performed using the standard protocol following bolus administration of intravenous contrast.  CONTRAST:  100mL OMNIPAQUE IOHEXOL 300 MG/ML  SOLN  COMPARISON:  CT IMAGE GUIDED DRAINAGE BY PERCUTANEOUS CATHETER dated 09/26/2013; CT ABD/PELVIS W CM dated 09/25/2013; DG NASO G TUBE PLC W/FL W/RAD dated 09/21/2013  FINDINGS: Trace  left pleural effusion. Moderate atelectasis in the right lower lobe. Emphysema noted with a collection a bulla in the lingula. There is a density in the distal esophagus which could reflect the tip of a nasogastric tube -consider chest radiography.  Considerable ascites below the right hemidiaphragm and above the liver. Again noted is a gaping bowl are wound with 2 drainage tubes in during the upper abdomen and coiling in the vicinity of the upper omentum, with scattered collections of gas and fluid in this vicinity. For the most part this gas is in scattered thin walled collections. There is more extraluminal gas in the left upper quadrant and was previously present. Complex perisplenic fluid is similar to prior. A relatively thin walled collection of fluid in the splenic hilum measures 5.4 x 4.6 cm on image 36 of series 2 (formerly 5.5 x 4.2 cm by my measurements). There is both an infiltrative and fluid density nodular component of the omentum.  Several small fluid collections are present along the inferior margin of the stomach. An index collection which previously measured 1.7 x 2.3 cm currently measures 1.8 x 3.0 cm, and a second index collection previously measuring 1.9 x 3.2 cm currently measures 2.2 x 3.4 cm.  Pancreas, adrenal glands, and right kidney unremarkable. There is a 2 mm left kidney lower pole nonobstructive calculus.  There appears to be oral contrast medium in the collection of fluid and gas above the spleen. Scrolling  through images 40 through 60 of series [id number], I am struck by the possibility of a continued gastric perforation along the proximal portion of the lesser curvature, with fistula or contained perforation connecting to this left upper quadrant collection which includes orally administered contrast medium. There was previously oral contrast in this vicinity, and accordingly some of this may be residual, but I suspect that this probably represents continued contained gastric  perforation  A right upper quadrant drain is present. This is successfully drain the fluid collection previously tracking along the right pericolic gutter, but could does not appear to have effectively drained the fluid collection above the liver, which may be loculated. Mild gallbladder wall thickening.  Several small extraluminal collections of fluid are present tracking along loops of bowel, in the left pericolic gutter, along the right anterior peritoneal margin, and in the pelvis. The extraluminal collections in the pelvis demonstrate only minimal internal gas density, less than prior.  Appendix unremarkable. Spondylosis and degenerative disc disease at L4-5 and L5-S1 with suspected left foraminal stenosis at both of these levels. Bony prominence along the right medial femoral neck may be from an osteochondroma or deformity from an old fracture.  Sigmoid diverticulosis.  IMPRESSION: 1. The right upper quadrant drain has successfully trend the fluid collection along the inferior right hepatic lobe margin and pericolic gutter, but has not drained the fluid collection along the dome of the liver. 2. Defect in the proximal lesser curvature of the stomach wall, with suspected active/current perforation in this vicinity and a collection of gas and fluid extending into the left upper quadrant above the spleen where there is also oral contrast medium suspicious for continued extravasation from the stomach. 3. Mildly increased extraluminal gas in the left upper quadrant. Scattered infiltrative gas and fluid throughout the omentum, with pockets of fluid scattered in the abdomen and pelvis. Of note, the drained fluid collection along the inferior right hepatic lobe is nearly resolved. However, the loculated collection above the right hepatic lobe is still present. 4. Left foraminal stenosis at L4-5 and L5-S1 due to spondylosis. 5. Bony prominence along the right medial femoral neck may be from osteochondroma or deformity  related to an old fracture. 6. Trace left pleural effusion with moderate atelectasis in the right lower lobe. 7. Emphysema.   Electronically Signed   By: Herbie Baltimore M.D.   On: 10/02/2013 08:00   Ct Image Guided Drainage Percut Cath  Peritoneal Retroperit  10/02/2013   CLINICAL DATA:  48 year old male with a history of severe gastric ulceration an rupture following chronic good the powder use. He had an open surgical repair which was complicated by multifocal intra-abdominal fluid collections. The CT scan performed earlier today demonstrates improvement of the majority of the collections save for the large right perihepatic fluid collection. Percutaneous aspiration will be performed, and if the fluid appears infected then drain placement would be indicated.  EXAM: CT IMAGE GUIDED DRAINAGE PERCUT CATH  PERITONEAL RETROPERIT  Date: 10/02/2013  TECHNIQUE: Informed consent was obtained from the patient following explanation of the procedure, risks, benefits and alternatives. The patient understands, agrees and consents for the procedure. All questions were addressed. A time out was performed.  Maximal barrier sterile technique utilized including caps, mask, sterile gowns, sterile gloves, large sterile drape, [place] hygiene, and Betadine skin prep. A planning axial CT scan was performed the right perihepatic fluid collection was identified. A suitable skin entry sites below the pleura was selected and marked. Local anesthesia was attained by  infiltration with 1% lidocaine. A small dermatotomy was made. Using CT fluoroscopic guidance, an 18 gauge trocar needle was advanced over rib and into the perihepatic fluid. Aspiration confirmed opaque, tan colored fluid concerning for infection. Therefore, a short Amplatz wire was advanced in a superolateral direction up over the liver dome. The skin tract was then dilated to 12 [place] and a Cook 12 [place] all-purpose drainage catheter advanced over the wire and formed.  Approximately 320 mL of opaque, tan colored fluid was then aspirated. A sample was submitted for culture. Post aspiration CT imaging demonstrates near-total resolution of the perihepatic fluid collection and no evidence of immediate complication. The drainage catheter was secured to the skin with 0 Prolene suture and bumper. The tube was flushed and connected to JP bulb suction.  There was no immediate complication, the patient tolerated the procedure well.  ANESTHESIA/SEDATION: Moderate (conscious) sedation was used. Two mg Versed, 100 mcg Fentanyl were administered intravenously. The patient's vital signs were monitored continuously by radiology nursing throughout the procedure.  Sedation Time: 19 minutes  PROCEDURE: 1. CT-guided aspiration of perihepatic fluid collection 2. Placement of a 12 French percutaneous drain Interventional Radiologist:  Sterling Big, MD  IMPRESSION: 1. Aspiration of perihepatic collection yields opaque, tan colored fluid concerning for potential infection. 2. Placement of a 26 French percutaneous drain with aspiration of 320 mL of opaque, tan colored fluid. A sample was sent for culture. Tube will be left to JP bulb suction. Recommend daily flushing and record output. Once output has minimized (less than 20 mL daily over the flushed volume) the tube could be removed. Recommend repeat CT imaging prior to tube removal.  Signed,  Sterling Big, MD  [facility]  [facility]   Electronically Signed   By: Malachy Moan M.D.   On: 10/02/2013 14:53    Anti-infectives: Anti-infectives   Start     Dose/Rate Route Frequency Ordered Stop   09/30/13 1500  ceFEPIme (MAXIPIME) 2 g in dextrose 5 % 50 mL IVPB     2 g 100 mL/hr over 30 Minutes Intravenous Every 12 hours 09/30/13 1443     09/26/13 1800  vancomycin (VANCOCIN) 50 mg/mL oral solution 250 mg  Status:  Discontinued     250 mg Oral 4 times per day 09/26/13 1510 09/26/13 1554    09/26/13 1600  metroNIDAZOLE (FLAGYL) IVPB 500 mg     500 mg 100 mL/hr over 60 Minutes Intravenous Every 6 hours 09/26/13 1556     09/26/13 1515  micafungin (MYCAMINE) 100 mg in sodium chloride 0.9 % 100 mL IVPB     100 mg 100 mL/hr over 1 Hours Intravenous Daily 09/26/13 1510     09/24/13 0930  piperacillin-tazobactam (ZOSYN) IVPB 3.375 g  Status:  Discontinued     3.375 g 12.5 mL/hr over 240 Minutes Intravenous 3 times per day 09/24/13 0832 09/30/13 1443   09/22/13 1400  metroNIDAZOLE (FLAGYL) IVPB 500 mg  Status:  Discontinued     500 mg 100 mL/hr over 60 Minutes Intravenous Every 8 hours 09/22/13 1119 09/26/13 1510   09/20/13 1230  fluconazole (DIFLUCAN) IVPB 200 mg  Status:  Discontinued     200 mg 100 mL/hr over 60 Minutes Intravenous Every 24 hours 09/20/13 1154 09/26/13 1510   09/20/13 1200  fluconazole (DIFLUCAN) IVPB 100 mg  Status:  Discontinued     100 mg 50 mL/hr over 60 Minutes Intravenous Every 24 hours 09/20/13 1151 09/20/13 1154  09/10/13 2200  metroNIDAZOLE (FLAGYL) IVPB 500 mg  Status:  Discontinued     500 mg 100 mL/hr over 60 Minutes Intravenous 3 times per day 09/10/13 1802 09/22/13 1118   09/10/13 2000  vancomycin (VANCOCIN) 50 mg/mL oral solution 500 mg  Status:  Discontinued     500 mg Oral 4 times per day 09/10/13 1802 09/21/13 1151   09/10/13 1800  vancomycin (VANCOCIN) 50 mg/mL oral solution 500 mg  Status:  Discontinued     500 mg Oral 4 times per day 09/10/13 1432 09/10/13 1802   09/10/13 1600  metroNIDAZOLE (FLAGYL) IVPB 500 mg  Status:  Discontinued     500 mg 100 mL/hr over 60 Minutes Intravenous 3 times per day 09/10/13 1432 09/10/13 1802   09/09/13 1602  vancomycin (VANCOCIN) 1,250 mg in sodium chloride 0.9 % 250 mL IVPB  Status:  Discontinued     1,250 mg 166.7 mL/hr over 90 Minutes Intravenous Every 24 hours 09/09/13 1057 09/10/13 1432   09/06/13 1600  imipenem-cilastatin (PRIMAXIN) 500 mg in sodium chloride 0.9 % 100 mL IVPB  Status:   Discontinued     500 mg 200 mL/hr over 30 Minutes Intravenous 3 times per day 09/06/13 1310 09/12/13 1048   09/03/13 1100  imipenem-cilastatin (PRIMAXIN) 500 mg in sodium chloride 0.9 % 100 mL IVPB  Status:  Discontinued     500 mg 200 mL/hr over 30 Minutes Intravenous Every 6 hours 09/03/13 1007 09/06/13 1310   09/02/13 1300  vancomycin (VANCOCIN) IVPB 1000 mg/200 mL premix  Status:  Discontinued     1,000 mg 200 mL/hr over 60 Minutes Intravenous Every 12 hours 09/01/13 1527 09/07/13 1340   08/30/13 2000  vancomycin (VANCOCIN) IVPB 750 mg/150 ml premix  Status:  Discontinued     750 mg 150 mL/hr over 60 Minutes Intravenous Every 8 hours 08/30/13 1356 09/01/13 1527   08/29/13 2200  micafungin (MYCAMINE) 100 mg in sodium chloride 0.9 % 100 mL IVPB  Status:  Discontinued     100 mg 100 mL/hr over 1 Hours Intravenous Every 24 hours 08/29/13 2036 09/10/13 1432   08/29/13 0400  vancomycin (VANCOCIN) IVPB 1000 mg/200 mL premix  Status:  Discontinued     1,000 mg 200 mL/hr over 60 Minutes Intravenous Every 8 hours 08/28/13 2358 08/30/13 1356   08/29/13 0400  piperacillin-tazobactam (ZOSYN) IVPB 3.375 g  Status:  Discontinued     3.375 g 12.5 mL/hr over 240 Minutes Intravenous 3 times per day 08/28/13 2358 09/03/13 1002   08/28/13 1845  [MAR Hold]  vancomycin (VANCOCIN) IVPB 1000 mg/200 mL premix     (On MAR Hold since 08/28/13 1957)   1,000 mg 200 mL/hr over 60 Minutes Intravenous  Once 08/28/13 1831 08/28/13 1955   08/28/13 1845  [MAR Hold]  piperacillin-tazobactam (ZOSYN) IVPB 3.375 g     (On MAR Hold since 08/28/13 1957)   3.375 g 100 mL/hr over 30 Minutes Intravenous  Once 08/28/13 1831 08/28/13 2056      Assessment/Plan: s/p Procedure(s): EXPLORATORY LAPAROTOMY,debridment of nacrotic stomach,and primary repair of perforated stomach. (N/A)  Rt lat abd abscess drain intact Perihepatic abscess drain intact Pt feeling some better- less pain today Will follow Plan per [facility]   LOS: 36  days    Takirah Binford A 10/03/2013

## 2013-10-03 NOTE — Progress Notes (Signed)
36 Days Post-Op  Subjective: Pt feels pretty good.  No N/V.  Abdominal pain well controlled.  Had a loose BM this am.  Ambulated 3 times yesterday.  NPO with NG tube (1104mL/24 hours)  Objective: Vital signs in last 24 hours: Temp:  [97.7 F (36.5 C)-98.8 F (37.1 C)] 98.6 F (37 C) (02/13 0502) Pulse Rate:  [93-119] 119 (02/13 0502) Resp:  [12-22] 18 (02/13 0502) BP: (117-135)/(73-94) 129/81 mmHg (02/13 0502) SpO2:  [96 %-100 %] 97 % (02/13 0502) Weight:  [200 lb 6.4 oz (90.9 kg)] 200 lb 6.4 oz (90.9 kg) (02/13 0500) Last BM Date: 10/02/13  Intake/Output from previous day: 02/12 0701 - 02/13 0700 In: 3375 [P.O.:175; I.V.:320; TPN:2880] Out: 2851 [Urine:2261; Emesis/NG output:100; Drains:490] Intake/Output this shift:    PE: Gen:  Alert, NAD, pleasant Pulm:  IS at 1200 Abd: Soft, mild tenderness, +BS, no HSM, has corset dressing on but removed during dressing change, good granulation tissue formation with some dark coagulated blood at the inferior aspect of the wound from the silver nitrate sticks, drain with frankly purulent yellow/white drainage from all 3 JP drains, New JP drain placed by IR over the dome of the liver with tan tissue like drainage as well as serosanguinous drainage   Lab Results:   Recent Labs  10/01/13 0550 10/02/13 0615  WBC 9.7 9.9  HGB 10.2* 9.6*  HCT 31.0* 28.9*  PLT 229 222   BMET  Recent Labs  10/02/13 0615  NA 132*  K 4.4  CL 100  CO2 20  GLUCOSE 99  BUN 29*  CREATININE 0.87  CALCIUM 7.7*   PT/INR  Recent Labs  10/02/13 1205  LABPROT 14.7  INR 1.17   CMP     Component Value Date/Time   NA 132* 10/02/2013 0615   K 4.4 10/02/2013 0615   CL 100 10/02/2013 0615   CO2 20 10/02/2013 0615   GLUCOSE 99 10/02/2013 0615   BUN 29* 10/02/2013 0615   CREATININE 0.87 10/02/2013 0615   CALCIUM 7.7* 10/02/2013 0615   PROT 6.4 10/02/2013 0615   ALBUMIN 2.1* 10/02/2013 0615   AST 16 10/02/2013 0615   ALT 15 10/02/2013 0615   ALKPHOS 113  10/02/2013 0615   BILITOT 0.3 10/02/2013 0615   GFRNONAA >90 10/02/2013 0615   GFRAA >90 10/02/2013 0615   Lipase     Component Value Date/Time   LIPASE 14 08/29/2013 0930       Studies/Results: Ct Abdomen Pelvis W Contrast  10/02/2013   CLINICAL DATA:  Intra-abdominal fluid collections.  EXAM: CT ABDOMEN AND PELVIS WITH CONTRAST  TECHNIQUE: Multidetector CT imaging of the abdomen and pelvis was performed using the standard protocol following bolus administration of intravenous contrast.  CONTRAST:  OMNIPAQUE IOHEXOL 300 MG/ML  SOLN  COMPARISON:  CT IMAGE GUIDED DRAINAGE BY PERCUTANEOUS CATHETER dated 09/26/2013; CT ABD/PELVIS W CM dated 09/25/2013; DG NASO G TUBE PLC W/FL W/RAD dated 09/21/2013  FINDINGS: Trace left pleural effusion. Moderate atelectasis in the right lower lobe. Emphysema noted with a collection a bulla in the lingula. There is a density in the distal esophagus which could reflect the tip of a nasogastric tube -consider chest radiography.  Considerable ascites below the right hemidiaphragm and above the liver. Again noted is a gaping bowl are wound with 2 drainage tubes in during the upper abdomen and coiling in the vicinity of the upper omentum, with scattered collections of gas and fluid in this vicinity. For the most part this gas  is in scattered thin walled collections. There is more extraluminal gas in the left upper quadrant and was previously present. Complex perisplenic fluid is similar to prior. A relatively thin walled collection of fluid in the splenic hilum measures 5.4 x 4.6 cm on image 36 of series 2 (formerly 5.5 x 4.2 cm by my measurements). There is both an infiltrative and fluid density nodular component of the omentum.  Several small fluid collections are present along the inferior margin of the stomach. An index collection which previously measured 1.7 x 2.3 cm currently measures 1.8 x 3.0 cm, and a second index collection previously measuring 1.9 x 3.2 cm currently  measures 2.2 x 3.4 cm.  Pancreas, adrenal glands, and right kidney unremarkable. There is a 2 mm left kidney lower pole nonobstructive calculus.  There appears to be oral contrast medium in the collection of fluid and gas above the spleen. Scrolling through images 40 through 60 of series [id number], I am struck by the possibility of a continued gastric perforation along the proximal portion of the lesser curvature, with fistula or contained perforation connecting to this left upper quadrant collection which includes orally administered contrast medium. There was previously oral contrast in this vicinity, and accordingly some of this may be residual, but I suspect that this probably represents continued contained gastric perforation  A right upper quadrant drain is present. This is successfully drain the fluid collection previously tracking along the right pericolic gutter, but could does not appear to have effectively drained the fluid collection above the liver, which may be loculated. Mild gallbladder wall thickening.  Several small extraluminal collections of fluid are present tracking along loops of bowel, in the left pericolic gutter, along the right anterior peritoneal margin, and in the pelvis. The extraluminal collections in the pelvis demonstrate only minimal internal gas density, less than prior.  Appendix unremarkable. Spondylosis and degenerative disc disease at L4-5 and L5-S1 with suspected left foraminal stenosis at both of these levels. Bony prominence along the right medial femoral neck may be from an osteochondroma or deformity from an old fracture.  Sigmoid diverticulosis.  IMPRESSION: 1. The right upper quadrant drain has successfully trend the fluid collection along the inferior right hepatic lobe margin and pericolic gutter, but has not drained the fluid collection along the dome of the liver. 2. Defect in the proximal lesser curvature of the stomach wall, with suspected active/current perforation  in this vicinity and a collection of gas and fluid extending into the left upper quadrant above the spleen where there is also oral contrast medium suspicious for continued extravasation from the stomach. 3. Mildly increased extraluminal gas in the left upper quadrant. Scattered infiltrative gas and fluid throughout the omentum, with pockets of fluid scattered in the abdomen and pelvis. Of note, the drained fluid collection along the inferior right hepatic lobe is nearly resolved. However, the loculated collection above the right hepatic lobe is still present. 4. Left foraminal stenosis at L4-5 and L5-S1 due to spondylosis. 5. Bony prominence along the right medial femoral neck may be from osteochondroma or deformity related to an old fracture. 6. Trace left pleural effusion with moderate atelectasis in the right lower lobe. 7. Emphysema.   Electronically Signed   By: Herbie BaltimoreWalt  Liebkemann M.D.   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 He  had an open surgical repair which was complicated by multifocal intra-abdominal fluid collections. The CT scan performed earlier today demonstrates improvement of the majority of the collections save for the large right perihepatic fluid collection. Percutaneous aspiration will be performed, and if the fluid appears infected then drain placement would be indicated.  EXAM: CT IMAGE GUIDED DRAINAGE PERCUT CATH  PERITONEAL RETROPERIT  Date: 10/02/2013  TECHNIQUE: Informed consent was obtained from the patient following explanation of the procedure, risks, benefits and alternatives. The patient understands, agrees and consents for the procedure. All questions were addressed. A time out was performed.  Maximal barrier sterile technique utilized including caps, mask, sterile gowns, sterile gloves, large  sterile drape, Halls hygiene, and Betadine skin prep. A planning axial CT scan was performed the right perihepatic fluid collection was identified. A suitable skin entry sites below the pleura was selected and marked. Local anesthesia was attained by infiltration with 1% lidocaine. A small dermatotomy was made. Using CT fluoroscopic guidance, an 18 gauge trocar needle was advanced over rib and into the perihepatic fluid. Aspiration confirmed opaque, tan colored fluid concerning for infection. Therefore, a short Amplatz wire was advanced in a superolateral direction up over the liver dome. The skin tract was then dilated to 12 [place] and a Cook 12 [place] all-purpose drainage catheter advanced over the wire and formed. Approximately 320 mL of opaque, tan colored fluid was then aspirated. A sample was submitted for culture. Post aspiration CT imaging demonstrates near-total resolution of the perihepatic fluid collection and no evidence of immediate complication. The drainage catheter was secured to the skin with 0 Prolene suture and bumper. The tube was flushed and connected to JP bulb suction.  There was no immediate complication, the patient tolerated the procedure well.  ANESTHESIA/SEDATION: Moderate (conscious) sedation was used. Two mg Versed, 100 mcg Fentanyl were administered intravenously. The patient's vital signs were monitored continuously by radiology nursing throughout the procedure.  Sedation Time: 19 minutes  PROCEDURE: 1. CT-guided aspiration of perihepatic fluid collection 2. Placement of a 12 French percutaneous drain Interventional Radiologist:  Sterling Big, MD  IMPRESSION: 1. Aspiration of perihepatic collection yields opaque, tan colored fluid concerning for potential infection. 2. Placement of a 58 French percutaneous drain with aspiration of 320 mL of opaque, tan colored fluid. A sample was sent for culture. Tube will be left to JP bulb suction. Recommend daily flushing and record output.  Once output has minimized (less than 20 mL daily over the flushed volume) the tube could be removed. Recommend repeat CT imaging prior to tube removal.  Signed,  Sterling Big, MD  [facility]  [facility]   Electronically Signed   By: Malachy Moan M.D.   On: 10/02/2013 14:53    Anti-infectives: Anti-infectives   Start     Dose/Rate Route Frequency Ordered Stop   09/30/13 1500  ceFEPIme (MAXIPIME) 2 g in dextrose 5 % 50 mL IVPB     2 g 100 mL/hr over 30 Minutes Intravenous Every 12 hours 09/30/13 1443     09/26/13 1800  vancomycin (VANCOCIN) 50 mg/mL oral solution 250 mg  Status:  Discontinued     250 mg Oral 4 times per day 09/26/13 1510 09/26/13 1554   09/26/13 1600  metroNIDAZOLE (FLAGYL) IVPB 500 mg     500 mg 100 mL/hr over 60 Minutes Intravenous Every 6 hours 09/26/13 1556     09/26/13 1515  micafungin (MYCAMINE) 100 mg in sodium chloride 0.9 %  100 mL IVPB     100 mg 100 mL/hr over 1 Hours Intravenous Daily 09/26/13 1510     09/24/13 0930  piperacillin-tazobactam (ZOSYN) IVPB 3.375 g  Status:  Discontinued     3.375 g 12.5 mL/hr over 240 Minutes Intravenous 3 times per day 09/24/13 0832 09/30/13 1443   09/22/13 1400  metroNIDAZOLE (FLAGYL) IVPB 500 mg  Status:  Discontinued     500 mg 100 mL/hr over 60 Minutes Intravenous Every 8 hours 09/22/13 1119 09/26/13 1510   09/20/13 1230  fluconazole (DIFLUCAN) IVPB 200 mg  Status:  Discontinued     200 mg 100 mL/hr over 60 Minutes Intravenous Every 24 hours 09/20/13 1154 09/26/13 1510   09/20/13 1200  fluconazole (DIFLUCAN) IVPB 100 mg  Status:  Discontinued     100 mg 50 mL/hr over 60 Minutes Intravenous Every 24 hours 09/20/13 1151 09/20/13 1154   09/10/13 2200  metroNIDAZOLE (FLAGYL) IVPB 500 mg  Status:  Discontinued     500 mg 100 mL/hr over 60 Minutes Intravenous 3 times per day 09/10/13 1802 09/22/13 1118   09/10/13 2000  vancomycin (VANCOCIN) 50 mg/mL oral solution 500 mg   Status:  Discontinued     500 mg Oral 4 times per day 09/10/13 1802 09/21/13 1151   09/10/13 1800  vancomycin (VANCOCIN) 50 mg/mL oral solution 500 mg  Status:  Discontinued     500 mg Oral 4 times per day 09/10/13 1432 09/10/13 1802   09/10/13 1600  metroNIDAZOLE (FLAGYL) IVPB 500 mg  Status:  Discontinued     500 mg 100 mL/hr over 60 Minutes Intravenous 3 times per day 09/10/13 1432 09/10/13 1802   09/09/13 1602  vancomycin (VANCOCIN) 1,250 mg in sodium chloride 0.9 % 250 mL IVPB  Status:  Discontinued     1,250 mg 166.7 mL/hr over 90 Minutes Intravenous Every 24 hours 09/09/13 1057 09/10/13 1432   09/06/13 1600  imipenem-cilastatin (PRIMAXIN) 500 mg in sodium chloride 0.9 % 100 mL IVPB  Status:  Discontinued     500 mg 200 mL/hr over 30 Minutes Intravenous 3 times per day 09/06/13 1310 09/12/13 1048   09/03/13 1100  imipenem-cilastatin (PRIMAXIN) 500 mg in sodium chloride 0.9 % 100 mL IVPB  Status:  Discontinued     500 mg 200 mL/hr over 30 Minutes Intravenous Every 6 hours 09/03/13 1007 09/06/13 1310   09/02/13 1300  vancomycin (VANCOCIN) IVPB 1000 mg/200 mL premix  Status:  Discontinued     1,000 mg 200 mL/hr over 60 Minutes Intravenous Every 12 hours 09/01/13 1527 09/07/13 1340   08/30/13 2000  vancomycin (VANCOCIN) IVPB 750 mg/150 ml premix  Status:  Discontinued     750 mg 150 mL/hr over 60 Minutes Intravenous Every 8 hours 08/30/13 1356 09/01/13 1527   08/29/13 2200  micafungin (MYCAMINE) 100 mg in sodium chloride 0.9 % 100 mL IVPB  Status:  Discontinued     100 mg 100 mL/hr over 1 Hours Intravenous Every 24 hours 08/29/13 2036 09/10/13 1432   08/29/13 0400  vancomycin (VANCOCIN) IVPB 1000 mg/200 mL premix  Status:  Discontinued     1,000 mg 200 mL/hr over 60 Minutes Intravenous Every 8 hours 08/28/13 2358 08/30/13 1356   08/29/13 0400  piperacillin-tazobactam (ZOSYN) IVPB 3.375 g  Status:  Discontinued     3.375 g 12.5 mL/hr over 240 Minutes Intravenous 3 times per day  08/28/13 2358 09/03/13 1002   08/28/13 1845  [MAR Hold]  vancomycin (VANCOCIN) IVPB 1000 mg/200  mL premix     (On MAR Hold since 08/28/13 1957)   1,000 mg 200 mL/hr over 60 Minutes Intravenous  Once 08/28/13 1831 08/28/13 1955   08/28/13 1845  [MAR Hold]  piperacillin-tazobactam (ZOSYN) IVPB 3.375 g     (On MAR Hold since 08/28/13 1957)   3.375 g 100 mL/hr over 30 Minutes Intravenous  Once 08/28/13 1831 08/28/13 2056       Assessment/Plan Perforated gastric ulcer s/p exploratory laparotomy Dr. Andrey Campanile 08/28/13  POD#36 -s/p IR drain placement to RUQ 2/5--culture negative to date. 2 additional surgical drains placed at time of surgery  -white count normal today. We will plan to repeat CT scan shows drained fluid collections in the pelvis, but fluid collection over the dome of the liver is still there.  IR placed drain on 10/02/13. Stomach still with evidence of active extravasation.  -Zosyn 2/4--->2/10. Started Maxipime 2/10--->. Micafungin 2/6--->. Indication: UGI fistula, multiple intra-abdominal fluid collections.  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 Diff colitis -treatment completed. Continue with flagyl while on antibiotics to reduce recurrence  Acute respiratory failure-resolved  NSTEMI-ischemia demand, stable, cards f/u  Acute systolic heart failure-echo 08/29/13 with diffuse hypokinesis EF 40-45%, poor study, repeat as OP.  Septic shock 2/2 peritonitis-resolved  AKI-resolved  Acute encephalopathy-resolved  Depression/anxiety -appreciate psych consult. Resume sertraline 50mg  PO daily when tolerating orals.  Anemia of critical illness-stable  Hypokalemia-resolved  Hx  HTN-stable. With tachycardia - slowly titrate metoprolol for tachycardia - 7.5mg  q4h (45mg /day)     LOS: 36 days    DORT, Brieonna Crutcher 10/03/2013, 7:16 AM Pager: [phone]

## 2013-10-03 NOTE — Progress Notes (Signed)
I discussed with Dr. Orvan Falconerampbell from infectious disease. Plan micafungin for 2 weeks total. We'll plan to stop cefepime Monday. Otherwise feeling much better status post drain placement yesterday. Doing better with incentive spirometry. Patient examined and I agree with the assessment and plan  Violeta GelinasBurke Dove Gresham, MD, MPH, FACS Pager: [phone]  10/03/2013 5:56 PM

## 2013-10-03 NOTE — Progress Notes (Signed)
PT Cancellation Note  Patient Details Name: James Mccormick MRN: [id number] DOB: 12/11/1965   Cancelled Treatment:    Reason Eval/Treat Not Completed: Medical issues which prohibited therapy. Patient with multiple loose stools today and currently soiled in bed and about to get cleaned up. Patient requested to hold on PT today. Was encouraged to move over the weekend as tolerated. Will continue to follow   Fredrich BirksRobinette, Serrita Lueth [name] 10/03/2013, 2:30 PM

## 2013-10-03 NOTE — Progress Notes (Signed)
Patient ID: James Mccormick James Mccormick, male   DOB: 12/01/1965, 48 y.o.   MRN: [id number]         [facility]    Date of Admission:  08/28/2013    Total days of antibiotics 36        Day 25 metronidazole        Day 11 gram-negative rod therapy, currently on cefepime        Day 8 micafungin Principal Problem:   Intra-abdominal abscess Active Problems:   Perforated gastric ulcer   ARDS (adult respiratory distress syndrome)   Hypocalcemia   Hypophosphatemia   Protein-calorie malnutrition, moderate   Atrial fibrillation   Pleural effusion   C. difficile colitis   Depression   Cigarette smoker   . sodium chloride   Intravenous Q24H  . antiseptic oral rinse  15 mL Mouth Rinse q12n4p  . ceFEPime (MAXIPIME) IV  2 g Intravenous Q12H  . chlorhexidine  15 mL Mouth Rinse BID  . enoxaparin (LOVENOX) injection  40 mg Subcutaneous Q24H  . insulin aspart  0-15 Units Subcutaneous 4 times per day  . metoprolol  7.5 mg Intravenous Q4H  . metronidazole  500 mg Intravenous Q6H  . micafungin (MYCAMINE) IV  100 mg Intravenous Daily  . pantoprazole (PROTONIX) IV  40 mg Intravenous Q12H  . sodium chloride  10-40 mL Intracatheter Q12H    Subjective: He is feeling well worse today after having 6 bouts of diarrhea. He is not having any significant abdominal pain.  Past Medical History  Diagnosis Date  . GERD (gastroesophageal reflux disease)   . Hypertension   . Depression   . Pleural effusion   . Dysrhythmia     ATRIAL FIBRILATION 08/2013    History  Substance Use Topics  . Smoking status: Current Every Day Smoker -- 1.00 packs/day    Types: Cigarettes  . Smokeless tobacco: Not on file  . Alcohol Use: No    Family History  Problem Relation Age of Onset  . Mental illness Mother   . Hypertension Sister   . Hypertension Brother     No Known Allergies  Objective: Temp:  [98.6 F (37 C)-98.8 F (37.1 C)] 98.6 F (37 C) (02/13 1449) Pulse Rate:  [93-119] 101  (02/13 1449) Resp:  [18-20] 18 (02/13 1449) BP: (123-132)/(80-94) 131/88 mmHg (02/13 1449) SpO2:  [97 %-100 %] 98 % (02/13 1449) Weight:  [90.9 kg (200 lb 6.4 oz)] 90.9 kg (200 lb 6.4 oz) (02/13 0500)  General: He is in no distress Abdomen: Nontender. Thin white drainage and drain bulbs  Lab Results Lab Results  Component Value Date   WBC 9.9 10/02/2013   HGB 9.6* 10/02/2013   HCT 28.9* 10/02/2013   MCV 88.1 10/02/2013   PLT 222 10/02/2013    Lab Results  Component Value Date   CREATININE 0.87 10/02/2013   BUN 29* 10/02/2013   NA 132* 10/02/2013   K 4.4 10/02/2013   CL 100 10/02/2013   CO2 20 10/02/2013    Lab Results  Component Value Date   ALT 15 10/02/2013   AST 16 10/02/2013   ALKPHOS 113 10/02/2013   BILITOT 0.3 10/02/2013      Microbiology: Recent Results (from the past 240 hour(s))  CULTURE, ROUTINE-ABSCESS     Status: None   Collection Time    09/26/13  4:02 PM      Result Value Ref Range Status   Specimen Description ABSCESS PERITONEAL CAVITY   Final  Special Requests RIGHT LATERAL ABOMINAL   Final   Gram Stain     Final   Value: ABUNDANT WBC PRESENT, PREDOMINANTLY PMN     NO SQUAMOUS EPITHELIAL CELLS SEEN     NO ORGANISMS SEEN     Performed at [facility]   Culture     Final   Value: NO GROWTH 3 DAYS     Performed at [facility]   Report Status 09/29/2013 FINAL   Final  BODY FLUID CULTURE     Status: None   Collection Time    10/02/13  1:53 PM      Result Value Ref Range Status   Specimen Description FLUID   Final   Special Requests     Final   Value: Normal INTRAABDOMINAL FLUID COLLECTION OVER THE LIVE DOME   Gram Stain     Final   Value: WBC PRESENT,BOTH PMN AND MONONUCLEAR     NO ORGANISMS SEEN     Performed at [facility]   Culture     Final   Value: NO GROWTH 1 DAY     Performed at [facility]   Report Status PENDING   Incomplete    Studies/Results: Ct Abdomen Pelvis W Contrast  10/02/2013   CLINICAL DATA:   Intra-abdominal fluid collections.  EXAM: CT ABDOMEN AND PELVIS WITH CONTRAST  TECHNIQUE: Multidetector CT imaging of the abdomen and pelvis was performed using the standard protocol following bolus administration of intravenous contrast.  CONTRAST:  OMNIPAQUE IOHEXOL 300 MG/ML  SOLN  COMPARISON:  CT IMAGE GUIDED DRAINAGE BY PERCUTANEOUS CATHETER dated 09/26/2013; CT ABD/PELVIS W CM dated 09/25/2013; DG NASO G TUBE PLC W/FL W/RAD dated 09/21/2013  FINDINGS: Trace left pleural effusion. Moderate atelectasis in the right lower lobe. Emphysema noted with a collection a bulla in the lingula. There is a density in the distal esophagus which could reflect the tip of a nasogastric tube -consider chest radiography.  Considerable ascites below the right hemidiaphragm and above the liver. Again noted is a gaping bowl are wound with 2 drainage tubes in during the upper abdomen and coiling in the vicinity of the upper omentum, with scattered collections of gas and fluid in this vicinity. For the most part this gas is in scattered thin walled collections. There is more extraluminal gas in the left upper quadrant and was previously present. Complex perisplenic fluid is similar to prior. A relatively thin walled collection of fluid in the splenic hilum measures 5.4 x 4.6 cm on image 36 of series 2 (formerly 5.5 x 4.2 cm by my measurements). There is both an infiltrative and fluid density nodular component of the omentum.  Several small fluid collections are present along the inferior margin of the stomach. An index collection which previously measured 1.7 x 2.3 cm currently measures 1.8 x 3.0 cm, and a second index collection previously measuring 1.9 x 3.2 cm currently measures 2.2 x 3.4 cm.  Pancreas, adrenal glands, and right kidney unremarkable. There is a 2 mm left kidney lower pole nonobstructive calculus.  There appears to be oral contrast medium in the collection of fluid and gas above the spleen. Scrolling through images  40 through 60 of series [id number], I am struck by the possibility of a continued gastric perforation along the proximal portion of the lesser curvature, with fistula or contained perforation connecting to this left upper quadrant collection which includes orally administered contrast medium. There was previously oral contrast in this vicinity, and  accordingly some of this may be residual, but I suspect that this probably represents continued contained gastric perforation  A right upper quadrant drain is present. This is successfully drain the fluid collection previously tracking along the right pericolic gutter, but could does not appear to have effectively drained the fluid collection above the liver, which may be loculated. Mild gallbladder wall thickening.  Several small extraluminal collections of fluid are present tracking along loops of bowel, in the left pericolic gutter, along the right anterior peritoneal margin, and in the pelvis. The extraluminal collections in the pelvis demonstrate only minimal internal gas density, less than prior.  Appendix unremarkable. Spondylosis and degenerative disc disease at L4-5 and L5-S1 with suspected left foraminal stenosis at both of these levels. Bony prominence along the right medial femoral neck may be from an osteochondroma or deformity from an old fracture.  Sigmoid diverticulosis.  IMPRESSION: 1. The right upper quadrant drain has successfully trend the fluid collection along the inferior right hepatic lobe margin and pericolic gutter, but has not drained the fluid collection along the dome of the liver. 2. Defect in the proximal lesser curvature of the stomach wall, with suspected active/current perforation in this vicinity and a collection of gas and fluid extending into the left upper quadrant above the spleen where there is also oral contrast medium suspicious for continued extravasation from the stomach. 3. Mildly increased extraluminal gas in the left upper  quadrant. Scattered infiltrative gas and fluid throughout the omentum, with pockets of fluid scattered in the abdomen and pelvis. Of note, the drained fluid collection along the inferior right hepatic lobe is nearly resolved. However, the loculated collection above the right hepatic lobe is still present. 4. Left foraminal stenosis at L4-5 and L5-S1 due to spondylosis. 5. Bony prominence along the right medial femoral neck may be from osteochondroma or deformity related to an old fracture. 6. Trace left pleural effusion with moderate atelectasis in the right lower lobe. 7. Emphysema.   Electronically Signed   By: Herbie Baltimore M.James.   On: 10/02/2013 08:00   Ct Image Guided Drainage Percut Cath  Peritoneal Retroperit  10/02/2013   CLINICAL DATA:  48 year old male with a history of severe gastric ulceration an rupture following chronic good the powder use. He had an open surgical repair which was complicated by multifocal intra-abdominal fluid collections. The CT scan performed earlier today demonstrates improvement of the majority of the collections save for the large right perihepatic fluid collection. Percutaneous aspiration will be performed, and if the fluid appears infected then drain placement would be indicated.  EXAM: CT IMAGE GUIDED DRAINAGE PERCUT CATH  PERITONEAL RETROPERIT  Date: 10/02/2013  TECHNIQUE: Informed consent was obtained from the patient following explanation of the procedure, risks, benefits and alternatives. The patient understands, agrees and consents for the procedure. All questions were addressed. A time out was performed.  Maximal barrier sterile technique utilized including caps, mask, sterile gowns, sterile gloves, large sterile drape, Slider hygiene, and Betadine skin prep. A planning axial CT scan was performed the right perihepatic fluid collection was identified. A suitable skin entry sites below the pleura was selected and marked. Local anesthesia was attained by infiltration  with 1% lidocaine. A small dermatotomy was made. Using CT fluoroscopic guidance, an 18 gauge trocar needle was advanced over rib and into the perihepatic fluid. Aspiration confirmed opaque, tan colored fluid concerning for infection. Therefore, a short Amplatz wire was advanced in a superolateral direction up over the liver dome. The skin  tract was then dilated to 14 [place] and a Cook 12 [place] all-purpose drainage catheter advanced over the wire and formed. Approximately 320 mL of opaque, tan colored fluid was then aspirated. A sample was submitted for culture. Post aspiration CT imaging demonstrates near-total resolution of the perihepatic fluid collection and no evidence of immediate complication. The drainage catheter was secured to the skin with 0 Prolene suture and bumper. The tube was flushed and connected to JP bulb suction.  There was no immediate complication, the patient tolerated the procedure well.  ANESTHESIA/SEDATION: Moderate (conscious) sedation was used. Two mg Versed, 100 mcg Fentanyl were administered intravenously. The patient's vital signs were monitored continuously by radiology nursing throughout the procedure.  Sedation Time: 19 minutes  PROCEDURE: 1. CT-guided aspiration of perihepatic fluid collection 2. Placement of a 12 French percutaneous drain Interventional Radiologist:  Sterling Big, MD  IMPRESSION: 1. Aspiration of perihepatic collection yields opaque, tan colored fluid concerning for potential infection. 2. Placement of a 52 French percutaneous drain with aspiration of 320 mL of opaque, tan colored fluid. A sample was sent for culture. Tube will be left to JP bulb suction. Recommend daily flushing and record output. Once output has minimized (less than 20 mL daily over the flushed volume) the tube could be removed. Recommend repeat CT imaging prior to tube removal.  Signed,  Sterling Big, MD  [facility]  [facility]    Electronically Signed   By: Malachy Moan M.James.   On: 10/02/2013 14:53    Assessment: Complete control of his C. difficile colitis is compromised by the need for systemic antibiotic therapy for his intra-abdominal abscesses and inability to administer oral vancomycin. He has not grown any bacterial pathogens from recent abscess aspirates. If yesterday's abscess culture remains negative I will stop cefepime within the next 48 hours. I will continue micafungin for a minimum of 2 weeks. I will continue IV metronidazole as the best alternate therapy for his C. difficile colitis for now.  Plan: 1. Monitor abscess culture and consider stopping cefepime soon 2. Continue IV metronidazole and micafungin  Cliffton Asters, MD [facility] [phone] pager   [phone] cell 10/03/2013, 4:33 PM

## 2013-10-04 LAB — CLOSTRIDIUM DIFFICILE BY PCR: CDIFFPCR: NEGATIVE

## 2013-10-04 LAB — GLUCOSE, CAPILLARY
GLUCOSE-CAPILLARY: 106 mg/dL — AB (ref 70–99)
GLUCOSE-CAPILLARY: 159 mg/dL — AB (ref 70–99)
GLUCOSE-CAPILLARY: 93 mg/dL (ref 70–99)
Glucose-Capillary: 114 mg/dL — ABNORMAL HIGH (ref 70–99)

## 2013-10-04 MED ORDER — FAT EMULSION 20 % IV EMUL
240.0000 mL | INTRAVENOUS | Status: AC
  Administered 2013-10-04: 240 mL via INTRAVENOUS
  Filled 2013-10-04 (×2): qty 250

## 2013-10-04 MED ORDER — TRACE MINERALS CR-CU-F-FE-I-MN-MO-SE-ZN IV SOLN
INTRAVENOUS | Status: AC
  Administered 2013-10-04: 18:00:00 via INTRAVENOUS
  Filled 2013-10-04: qty 2400

## 2013-10-04 NOTE — Progress Notes (Signed)
Gen. Surgery: I have interviewed and examined this patient today. I agree with the assessment and treatment plan outlined by Ms. Riebock, NP.    Patient is stable. He's feeling better since the fluid collection was drained on Thursday. Abdomen soft. Drains are functioning. Wound granulating reasonably well. On cefepime( plan stop on Monday),   Flagyl,   micafungin(2 weeks total) I.D. Following. I discussed the general surgical treatment plan with him.  Angelia MouldHaywood M. Derrell LollingIngram, M.D., [facility] General and Minimally invasive Surgery Breast and Colorectal Surgery Office:   [phone] Pager:   [phone]

## 2013-10-04 NOTE — Progress Notes (Signed)
Spoke with Dr. Orvan Falconerampbell regarding negative PCR cdiff result today.  Need to leave pt on contact isolation for now for protocol and with antibiotics pt is receiving.

## 2013-10-04 NOTE — Progress Notes (Signed)
Patient ID: Pamalee LeydenChristopher D Usrey, male   DOB: 12/29/1965, 48 y.o.   MRN: [id number]  Subjective: No changes.  Pt sitting up in chair comfortably.  abd binder in place.    Objective:  Vital signs:  Filed Vitals:   10/03/13 2205 10/04/13 0203 10/04/13 0300 10/04/13 0559  BP: 120/82 135/86  122/67  Pulse: 94 100  107  Temp: 98.9 F (37.2 C)   98.6 F (37 C)  TempSrc: Oral   Oral  Resp: 20   18  Height:      Weight:   89.7 kg (197 lb 12 oz)   SpO2: 100%   99%    Last BM Date: 10/03/13  Intake/Output   Yesterday:  02/13 0701 - 02/14 0700 In: 6160 [I.V.:380; NG/GT:50; IV Piggyback:3200; TPN:2500] Out: 4750 [Urine:3500; Emesis/NG output:1200; Drains:50] This shift:  Total I/O In: -  Out: 40 [Drains:40]  Physical Exam:  General: Pt awake/alert/oriented and in no acute distress  Chest:CTA. No chest wall pain w good excursion  CV: Pulses intact. Regular rhythm. tachycardic  Bowel sounds are present. Non tender. Bilateral drains with purulent/milky output. Midline dressing is c/d/i. Will need to suture LUQ drain.  Skin: No petechiae / purpura      Problem List:   Principal Problem:   Intra-abdominal abscess Active Problems:   Perforated gastric ulcer   ARDS (adult respiratory distress syndrome)   Hypocalcemia   Hypophosphatemia   Protein-calorie malnutrition, moderate   Atrial fibrillation   Pleural effusion   C. difficile colitis   Depression   Cigarette smoker    Results:   Labs: Results for orders placed during the hospital encounter of 08/28/13 (from the past 48 hour(s))  PROTIME-INR     Status: None   Collection Time    10/02/13 12:05 PM      Result Value Ref Range   Prothrombin Time 14.7  11.6 - 15.2 seconds   INR 1.17  0.00 - 1.49  GLUCOSE, CAPILLARY     Status: Abnormal   Collection Time    10/02/13 12:08 PM      Result Value Ref Range   Glucose-Capillary 118 (*) 70 - 99 mg/dL  BODY FLUID CULTURE     Status: None   Collection Time    10/02/13   1:53 PM      Result Value Ref Range   Specimen Description FLUID     Special Requests       Value: Normal INTRAABDOMINAL FLUID COLLECTION OVER THE LIVE DOME   Gram Stain       Value: WBC PRESENT,BOTH PMN AND MONONUCLEAR     NO ORGANISMS SEEN     Performed at [facility]   Culture       Value: NO GROWTH 1 DAY     Performed at [facility]   Report Status PENDING    GLUCOSE, CAPILLARY     Status: None   Collection Time    10/02/13  6:09 PM      Result Value Ref Range   Glucose-Capillary 93  70 - 99 mg/dL  GLUCOSE, CAPILLARY     Status: Abnormal   Collection Time    10/02/13  7:51 PM      Result Value Ref Range   Glucose-Capillary 134 (*) 70 - 99 mg/dL  GLUCOSE, CAPILLARY     Status: Abnormal   Collection Time    10/03/13 12:21 AM      Result Value Ref Range   Glucose-Capillary 155 (*)  70 - 99 mg/dL  GLUCOSE, CAPILLARY     Status: Abnormal   Collection Time    10/03/13  5:49 AM      Result Value Ref Range   Glucose-Capillary 111 (*) 70 - 99 mg/dL  GLUCOSE, CAPILLARY     Status: Abnormal   Collection Time    10/03/13 12:04 PM      Result Value Ref Range   Glucose-Capillary 105 (*) 70 - 99 mg/dL  GLUCOSE, CAPILLARY     Status: None   Collection Time    10/03/13  5:13 PM      Result Value Ref Range   Glucose-Capillary 87  70 - 99 mg/dL  GLUCOSE, CAPILLARY     Status: Abnormal   Collection Time    10/03/13  8:04 PM      Result Value Ref Range   Glucose-Capillary 175 (*) 70 - 99 mg/dL   Comment 1 Documented in Chart     Comment 2 CARLA RN    GLUCOSE, CAPILLARY     Status: Abnormal   Collection Time    10/04/13 12:01 AM      Result Value Ref Range   Glucose-Capillary 159 (*) 70 - 99 mg/dL  GLUCOSE, CAPILLARY     Status: Abnormal   Collection Time    10/04/13  5:55 AM      Result Value Ref Range   Glucose-Capillary 114 (*) 70 - 99 mg/dL   Comment 1 Notify RN      Imaging / Studies: Ct Image Guided Drainage Percut Cath  Peritoneal  Retroperit  10/02/2013   CLINICAL DATA:  48 year old male with a history of severe gastric ulceration an rupture following chronic good the powder use. He had an open surgical repair which was complicated by multifocal intra-abdominal fluid collections. The CT scan performed earlier today demonstrates improvement of the majority of the collections save for the large right perihepatic fluid collection. Percutaneous aspiration will be performed, and if the fluid appears infected then drain placement would be indicated.  EXAM: CT IMAGE GUIDED DRAINAGE PERCUT CATH  PERITONEAL RETROPERIT  Date: 10/02/2013  TECHNIQUE: Informed consent was obtained from the patient following explanation of the procedure, risks, benefits and alternatives. The patient understands, agrees and consents for the procedure. All questions were addressed. A time out was performed.  Maximal barrier sterile technique utilized including caps, mask, sterile gowns, sterile gloves, large sterile drape, Wenke hygiene, and Betadine skin prep. A planning axial CT scan was performed the right perihepatic fluid collection was identified. A suitable skin entry sites below the pleura was selected and marked. Local anesthesia was attained by infiltration with 1% lidocaine. A small dermatotomy was made. Using CT fluoroscopic guidance, an 18 gauge trocar needle was advanced over rib and into the perihepatic fluid. Aspiration confirmed opaque, tan colored fluid concerning for infection. Therefore, a short Amplatz wire was advanced in a superolateral direction up over the liver dome. The skin tract was then dilated to 12 [place] and a Cook 12 [place] all-purpose drainage catheter advanced over the wire and formed. Approximately 320 mL of opaque, tan colored fluid was then aspirated. A sample was submitted for culture. Post aspiration CT imaging demonstrates near-total resolution of the perihepatic fluid collection and no evidence of immediate complication. The drainage  catheter was secured to the skin with 0 Prolene suture and bumper. The tube was flushed and connected to JP bulb suction.  There was no immediate complication, the patient tolerated the procedure well.  ANESTHESIA/SEDATION:  Moderate (conscious) sedation was used. Two mg Versed, 100 mcg Fentanyl were administered intravenously. The patient's vital signs were monitored continuously by radiology nursing throughout the procedure.  Sedation Time: 19 minutes  PROCEDURE: 1. CT-guided aspiration of perihepatic fluid collection 2. Placement of a 12 French percutaneous drain Interventional Radiologist:  Sterling Big, MD  IMPRESSION: 1. Aspiration of perihepatic collection yields opaque, tan colored fluid concerning for potential infection. 2. Placement of a 24 French percutaneous drain with aspiration of 320 mL of opaque, tan colored fluid. A sample was sent for culture. Tube will be left to JP bulb suction. Recommend daily flushing and record output. Once output has minimized (less than 20 mL daily over the flushed volume) the tube could be removed. Recommend repeat CT imaging prior to tube removal.  Signed,  Sterling Big, MD  [facility]  [facility]   Electronically Signed   By: Malachy Moan M.D.   On: 10/02/2013 14:53    Medications / Allergies: per chart  Antibiotics: Anti-infectives   Start     Dose/Rate Route Frequency Ordered Stop   09/30/13 1500  ceFEPIme (MAXIPIME) 2 g in dextrose 5 % 50 mL IVPB     2 g 100 mL/hr over 30 Minutes Intravenous Every 12 hours 09/30/13 1443     09/26/13 1800  vancomycin (VANCOCIN) 50 mg/mL oral solution 250 mg  Status:  Discontinued     250 mg Oral 4 times per day 09/26/13 1510 09/26/13 1554   09/26/13 1600  metroNIDAZOLE (FLAGYL) IVPB 500 mg     500 mg 100 mL/hr over 60 Minutes Intravenous Every 6 hours 09/26/13 1556     09/26/13 1515  micafungin (MYCAMINE) 100 mg in sodium chloride 0.9 % 100 mL IVPB      100 mg 100 mL/hr over 1 Hours Intravenous Daily 09/26/13 1510     09/24/13 0930  piperacillin-tazobactam (ZOSYN) IVPB 3.375 g  Status:  Discontinued     3.375 g 12.5 mL/hr over 240 Minutes Intravenous 3 times per day 09/24/13 0832 09/30/13 1443   09/22/13 1400  metroNIDAZOLE (FLAGYL) IVPB 500 mg  Status:  Discontinued     500 mg 100 mL/hr over 60 Minutes Intravenous Every 8 hours 09/22/13 1119 09/26/13 1510   09/20/13 1230  fluconazole (DIFLUCAN) IVPB 200 mg  Status:  Discontinued     200 mg 100 mL/hr over 60 Minutes Intravenous Every 24 hours 09/20/13 1154 09/26/13 1510   09/20/13 1200  fluconazole (DIFLUCAN) IVPB 100 mg  Status:  Discontinued     100 mg 50 mL/hr over 60 Minutes Intravenous Every 24 hours 09/20/13 1151 09/20/13 1154   09/10/13 2200  metroNIDAZOLE (FLAGYL) IVPB 500 mg  Status:  Discontinued     500 mg 100 mL/hr over 60 Minutes Intravenous 3 times per day 09/10/13 1802 09/22/13 1118   09/10/13 2000  vancomycin (VANCOCIN) 50 mg/mL oral solution 500 mg  Status:  Discontinued     500 mg Oral 4 times per day 09/10/13 1802 09/21/13 1151   09/10/13 1800  vancomycin (VANCOCIN) 50 mg/mL oral solution 500 mg  Status:  Discontinued     500 mg Oral 4 times per day 09/10/13 1432 09/10/13 1802   09/10/13 1600  metroNIDAZOLE (FLAGYL) IVPB 500 mg  Status:  Discontinued     500 mg 100 mL/hr over 60 Minutes Intravenous 3 times per day 09/10/13 1432 09/10/13 1802   09/09/13 1602  vancomycin (VANCOCIN) 1,250 mg in sodium chloride 0.9 %  250 mL IVPB  Status:  Discontinued     1,250 mg 166.7 mL/hr over 90 Minutes Intravenous Every 24 hours 09/09/13 1057 09/10/13 1432   09/06/13 1600  imipenem-cilastatin (PRIMAXIN) 500 mg in sodium chloride 0.9 % 100 mL IVPB  Status:  Discontinued     500 mg 200 mL/hr over 30 Minutes Intravenous 3 times per day 09/06/13 1310 09/12/13 1048   09/03/13 1100  imipenem-cilastatin (PRIMAXIN) 500 mg in sodium chloride 0.9 % 100 mL IVPB  Status:  Discontinued     500  mg 200 mL/hr over 30 Minutes Intravenous Every 6 hours 09/03/13 1007 09/06/13 1310   09/02/13 1300  vancomycin (VANCOCIN) IVPB 1000 mg/200 mL premix  Status:  Discontinued     1,000 mg 200 mL/hr over 60 Minutes Intravenous Every 12 hours 09/01/13 1527 09/07/13 1340   08/30/13 2000  vancomycin (VANCOCIN) IVPB 750 mg/150 ml premix  Status:  Discontinued     750 mg 150 mL/hr over 60 Minutes Intravenous Every 8 hours 08/30/13 1356 09/01/13 1527   08/29/13 2200  micafungin (MYCAMINE) 100 mg in sodium chloride 0.9 % 100 mL IVPB  Status:  Discontinued     100 mg 100 mL/hr over 1 Hours Intravenous Every 24 hours 08/29/13 2036 09/10/13 1432   08/29/13 0400  vancomycin (VANCOCIN) IVPB 1000 mg/200 mL premix  Status:  Discontinued     1,000 mg 200 mL/hr over 60 Minutes Intravenous Every 8 hours 08/28/13 2358 08/30/13 1356   08/29/13 0400  piperacillin-tazobactam (ZOSYN) IVPB 3.375 g  Status:  Discontinued     3.375 g 12.5 mL/hr over 240 Minutes Intravenous 3 times per day 08/28/13 2358 09/03/13 1002   08/28/13 1845  [MAR Hold]  vancomycin (VANCOCIN) IVPB 1000 mg/200 mL premix     (On MAR Hold since 08/28/13 1957)   1,000 mg 200 mL/hr over 60 Minutes Intravenous  Once 08/28/13 1831 08/28/13 1955   08/28/13 1845  [MAR Hold]  piperacillin-tazobactam (ZOSYN) IVPB 3.375 g     (On MAR Hold since 08/28/13 1957)   3.375 g 100 mL/hr over 30 Minutes Intravenous  Once 08/28/13 1831 08/28/13 2056      Assessment/Plan  Acute respiratory failure-resolved  NSTEMI-ischemia demand, stable, cards follow up  Acute systolic heart failure-echo 08/29/13 with diffuse hypokinesis with EF 40-45%, poor study, recommend repeat. Will need cardiology follow up.  Septic shock 2/2 peritonitis-resolved  AKI-resolved  Acute encephalopathy-resolved  Depression/anxiety  -psych consulted. BJY:NWGNFA sertraline 50mg  PO daily when tolerating orals.  Anemia of critical illness-stable  Hypokalemia-resolved  Hx HTN-stable. Continue  with metoprolol IV. Tachycardia improved.  Perforated gastric ulcer s/p exploratory laparotomy Dr. Andrey Campanile 08/28/13  POD#37  -s/p IR drain placement to RUQ x2. 2 additional surgical drains placed at time of surgery  -repeat CT still with extravasation, pelvic fluid collection resolved, fluid collection to dome of liver-->additional drain placed on 2/13 -restarted zosyn 2/4--->2/10. Started Maxipime 2/10--->STOP MONDAY. Micafungin 2/6--->STOP AFTER 2 WEEKS OF THERAPY. Indication: UGI fistula, multiple intra-abdominal fluid collections.  -appreciate ID assistance  -midline wound has dehisced, continue with BID wet to dry dressing changes, abdominal binder  -Continue NGT to suction, TPN, NPO  consider PANDA tube for enteral feeding vs GJ tube placement, although these options would be difficult to accomplish, but he may benefit greatly from enteral feeding opposed to TPN -VTE prophylaxis; SCDs, lovenox  PCM  -continue with TPN  C. Diff colitis  -treatment completed. Continue with flagyl while on antibiotics to reduce recurrence   Raegyn Renda  Kyrstyn Greear, [facility] Pager [phone] Office [phone]  10/04/2013 9:12 AM

## 2013-10-04 NOTE — Progress Notes (Signed)
PARENTERAL NUTRITION CONSULT NOTE - FOLLOW UP  Pharmacy Consult for TPN Indication: prolonged ileus  No Known Allergies  Patient Measurements: Height: 5\' 11"  (180.3 cm) Weight: 197 lb 12 oz (89.7 kg) IBW/kg (Calculated) : 75.3 Adjusted Body Weight:  Usual Weight:   Vital Signs: Temp: 98.6 F (37 C) (02/14 0559) Temp src: Oral (02/14 0559) BP: 122/67 mmHg (02/14 0559) Pulse Rate: 107 (02/14 0559) Intake/Output from previous day: 02/13 0701 - 02/14 0700 In: 6160 [I.V.:380; NG/GT:50; IV Piggyback:3200; TPN:2500] Out: 4750 [Urine:3500; Emesis/NG output:1200; Drains:50] Intake/Output from this shift: Total I/O In: 0  Out: 440 [Urine:400; Drains:40]  Labs:  Recent Labs  10/02/13 0615 10/02/13 1205  WBC 9.9  --   HGB 9.6*  --   HCT 28.9*  --   PLT 222  --   INR  --  1.17     Recent Labs  10/02/13 0615  NA 132*  K 4.4  CL 100  CO2 20  GLUCOSE 99  BUN 29*  CREATININE 0.87  CALCIUM 7.7*  MG 1.9  PHOS 3.9  PROT 6.4  ALBUMIN 2.1*  AST 16  ALT 15  ALKPHOS 113  BILITOT 0.3   Estimated Creatinine Clearance: 109.4 ml/min (by C-G formula based on Cr of 0.87).    Recent Labs  10/03/13 2004 10/04/13 0001 10/04/13 0555  GLUCAP 175* 159* 114*    Medications:  Scheduled:  . sodium chloride   Intravenous Q24H  . antiseptic oral rinse  15 mL Mouth Rinse q12n4p  . ceFEPime (MAXIPIME) IV  2 g Intravenous Q12H  . chlorhexidine  15 mL Mouth Rinse BID  . enoxaparin (LOVENOX) injection  40 mg Subcutaneous Q24H  . insulin aspart  0-15 Units Subcutaneous 4 times per day  . metoprolol  7.5 mg Intravenous Q4H  . metronidazole  500 mg Intravenous Q6H  . micafungin (MYCAMINE) IV  100 mg Intravenous Daily  . pantoprazole (PROTONIX) IV  40 mg Intravenous Q12H  . sodium chloride  10-40 mL Intracatheter Q12H    Insulin Requirements in the past 24 hours:  40 units insulin in TPN + 6 units mod SSI   Current Nutrition:  Clinimix E 5/15 2400 mL over 12 hours + lipid  emulsion 20% at 240 mL over 12 hr- provides 120 gm protein and 2352 kcal which is 92% protein goals and 100% kcal goals  NPO   Nutritional Goals:  2250-2450 kcal and 130-140 gm protein per RD 2/3   Assessment:  48 y.o. male presented to ED 1/8 with abd pain. CT consistent with perforated stomach ulcer due to NSAID use, pneumoperitoneum, peritonitis. Taken emergently to OR where he underwent exp lap, debridement of necrotic stomach, repair of perforated stomach. Contrast study showed no leak from stomach or small bowel.   GI: CT scan 1/31 poss persistent gastric perf, fluid collection 4.5x4.9, and 3rd collection between bladder and rectum. 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 Will require NPO/NG/TNA for a long time- TPN has been transitioned to cyclic over 12 hours; abdominal drain- output in past 24 hours, NGT out. S/P CT guided asp'n of perihepatic fluid 2/13 -> , concerning for infxn & sent for cx. Loose BMs, abd pain controlled. (+)BS, abd NT.  Considering GJT for benefit of enteral feeding vs tpn  Endo: No h/o DM. CBGs 87, 105 off TPN & 175, 159 on TPN. 40 units of insulin in bag.   Lytes: No labs today   Heptobili: LFTs wnl except albumin which remains  low at 2.1. Pre-albumin now WNL at 21.3- good sign of recovering nutritional status; trigs 214   Renal: 2/12- Scr 0.87- improved, est CrCl >16600mL/min; UOP 1.6 mL/kg/hr in last 24 hours   ID: WBC 9.9, afebrile; per ID note,  Cont Micafungin , and to remain on IV Flagyl while on abx to prevent recurrence of CDiff.  Hope to stop Cefepime soon per ID.  Repeat CDiff on 2/14- (p)  Vanc 1/8>>1/21 Zosyn 1/8>>1/14 Micafungin 1/9>>1/21; 2/6>> Primaxin 1/14>>1/23  Vanc PO 1/21>>1/31  Flagyl 1/21 >>  Zosyn 2/4 >>  Fluconazole 1/31>>2/6  Cefepime 2/10 >>  Cultures:  2/12 intra-abd fluid: NTD  2/6 peritoneal abscess: NF  2/1 peritoneal cavity: few candida glabrata  1/30 urine: NF  1/21 CDiff Positive  1/16 Trach  aspirate: NF  1/9 Blood cultures: NF   TPN Access: triple lumen PICC 1/20; replaced 1/27  TPN day#: 36   Plan:  1. Continue Clinimix E 5/15 + full trace elements and multivitamins + 40 units of regular insulin at cyclic rate- 50 ml/hr x 1 hr, then run at 230 ml/hr x 10 hrs, then run at 50 ml/hr x1 hr for 12 hours total. 20% Lipids to run at 8420mL/hr for 12 hours.  2. Continue 1/2NS at 10-2320mL/hr ONLY when TPN is not running (0600-1800)  3. CBG checks and moderate SSI correction at 0600/1200/2000/2400  4. TPN labs as ordered  5. Follow up culture results and plans for feeding tube  Marisue HumbleKendra Maykel Reitter, PharmD Clinical Pharmacist [facility]

## 2013-10-05 LAB — GLUCOSE, CAPILLARY
GLUCOSE-CAPILLARY: 82 mg/dL (ref 70–99)
GLUCOSE-CAPILLARY: 104 mg/dL — AB (ref 70–99)
Glucose-Capillary: 150 mg/dL — ABNORMAL HIGH (ref 70–99)
Glucose-Capillary: 168 mg/dL — ABNORMAL HIGH (ref 70–99)

## 2013-10-05 MED ORDER — FAT EMULSION 20 % IV EMUL
240.0000 mL | INTRAVENOUS | Status: AC
  Administered 2013-10-05: 240 mL via INTRAVENOUS
  Filled 2013-10-05 (×2): qty 250

## 2013-10-05 MED ORDER — TRACE MINERALS CR-CU-F-FE-I-MN-MO-SE-ZN IV SOLN
INTRAVENOUS | Status: AC
  Administered 2013-10-05: 18:00:00 via INTRAVENOUS
  Filled 2013-10-05: qty 2400

## 2013-10-05 NOTE — Progress Notes (Signed)
PARENTERAL NUTRITION CONSULT NOTE - FOLLOW UP  Pharmacy Consult for TPN Indication: Prolonged ileus  No Known Allergies  Patient Measurements: Height: 5\' 11"  (180.3 cm) Weight: 197 lb 15.6 oz (89.8 kg) IBW/kg (Calculated) : 75.3 Adjusted Body Weight:  Usual Weight:   Vital Signs: Temp: 99.5 F (37.5 C) (02/15 0502) Temp src: Oral (02/15 0502) BP: 123/82 mmHg (02/15 0936) Pulse Rate: 102 (02/15 0936) Intake/Output from previous day: 02/14 0701 - 02/15 0700 In: 10  Out: 5593 [Urine:4475; Emesis/NG output:1000; Drains:117; Stool:1] Intake/Output from this shift: Total I/O In: 1367.7 [I.V.:557.7; Other:10; IV Piggyback:800] Out: 235 [Urine:200; Drains:35]  Labs: No results found for this basename: WBC, HGB, HCT, PLT, APTT, INR,  in the last 72 hours  No results found for this basename: NA, K, CL, CO2, GLUCOSE, BUN, CREATININE, LABCREA, CREAT24HRUR, CALCIUM, MG, PHOS, PROT, ALBUMIN, AST, ALT, ALKPHOS, BILITOT, BILIDIR, IBILI, PREALBUMIN, TRIG, CHOLHDL, CHOL,  in the last 72 hours Estimated Creatinine Clearance: 109.4 ml/min (by C-G formula based on Cr of 0.87).    Recent Labs  10/05/13 0024 10/05/13 0629 10/05/13 1147  GLUCAP 150* 82 104*    Medications:  Scheduled:  . sodium chloride   Intravenous Q24H  . antiseptic oral rinse  15 mL Mouth Rinse q12n4p  . ceFEPime (MAXIPIME) IV  2 g Intravenous Q12H  . chlorhexidine  15 mL Mouth Rinse BID  . enoxaparin (LOVENOX) injection  40 mg Subcutaneous Q24H  . insulin aspart  0-15 Units Subcutaneous 4 times per day  . metoprolol  7.5 mg Intravenous Q4H  . metronidazole  500 mg Intravenous Q6H  . micafungin (MYCAMINE) IV  100 mg Intravenous Daily  . pantoprazole (PROTONIX) IV  40 mg Intravenous Q12H  . sodium chloride  10-40 mL Intracatheter Q12H    Insulin Requirements in the past 24 hours:  40 units insulin in TPN + 6 units mod SSI   Current Nutrition:  Clinimix E 5/15 2400 mL over 12 hours + lipid emulsion 20% at 240  mL over 12 hr- provides 120 gm protein and 2352 kcal which is 92% protein goals and 100% kcal goals  NPO   Nutritional Goals:  2250-2450 kcal and 130-140 gm protein per RD 2/3   Assessment:  48 y.o. male presented to ED 1/8 with abd pain. CT consistent with perforated stomach ulcer due to NSAID use, pneumoperitoneum, peritonitis. Taken emergently to OR where he underwent exp lap, debridement of necrotic stomach, repair of perforated stomach. Contrast study showed no leak from stomach or small bowel.   GI: CT scan 1/31 poss persistent gastric perf, fluid collection 4.5x4.9, and 3rd collection between bladder and rectum. 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 Will require NPO/NG/TNA for a long time- TPN has been transitioned to cyclic over 12 hours; abdominal drain- 490mL output in past 24 hours, NGT 100mL out. S/P CT guided asp'n of perihepatic fluid 2/13 -> 320mL, concerning for infxn & sent for cx. Loose BMs, abd pain minimal. (+)BS/flatus, abd NT. Considering GJT for benefit of enteral feeding vs tpn   Endo: No h/o DM. CBGs 82,106 off TPN & 150 on TPN. 40 units of insulin in bag.   Lytes: No labs today   Heptobili: LFTs wnl except albumin which remains low at 2.1. Pre-albumin now WNL at 21.3- good sign of recovering nutritional status; trigs 214   Renal: 2/12- Scr 0.87- improved, est CrCl >17200mL/min; UOP 1.6 mL/kg/hr in last 24 hours   ID: WBC 9.9, afebrile; per ID note, Cont Micafungin  for total 14 days , and to remain on IV Flagyl while on abx to prevent recurrence of CDiff. Plan to stop Cefepime 2/16 per [facility]CS note. Repeat CDiff (-) on 2/14   Vanc 1/8>>1/21 Zosyn 1/8>>1/14 Micafungin 1/9>>1/21; 2/6>> Primaxin 1/14>>1/23  Vanc PO 1/21>>1/31  Flagyl 1/21 >>  Zosyn 2/4 >>  Fluconazole 1/31>>2/6  Cefepime 2/10 >>   Cultures:  2/12 intra-abd fluid: NTD  2/6 peritoneal abscess: NF  2/1 peritoneal cavity: few candida glabrata  1/30 urine: NF  1/21 CDiff Positive  1/16 Trach  aspirate: NF  1/9 Blood cultures: NF   TPN Access: triple lumen PICC 1/20; replaced 1/27  TPN day#: 36   Plan:  1. Continue Clinimix E 5/15 + full trace elements and multivitamins + 40 units of regular insulin at cyclic rate- 50 ml/hr x 1 hr, then run at 230 ml/hr x 10 hrs, then run at 50 ml/hr x1 hr for 12 hours total. 20% Lipids to run at 70mL/hr for 12 hours.  2. Continue 1/2NS at 10-44mL/hr ONLY when TPN is not running (0600-1800)  3. CBG checks on and off TPN 4. TPN labs as ordered  5. Follow up culture results and plans for feeding tube  Marisue Humble, PharmD Clinical Pharmacist [facility]

## 2013-10-05 NOTE — Progress Notes (Addendum)
38 Days Post-Op  Subjective: Remained stable. Minimal pain. Had a small loose stool. Passes flatus several times a day.  Afebrile.All drains functioning and remains somewhat purulent. Drains 1 and 2 are very minimal. Urine output was high yesterday.  C. Diff PCR negative yesterday. Culture from recent right-sided abscess drainage shows no growth at 2 days.  Objective: Vital signs in last 24 hours: Temp:  [98.5 F (36.9 C)-99.5 F (37.5 C)] 99.5 F (37.5 C) (02/15 0502) Pulse Rate:  [97-114] 102 (02/15 0936) Resp:  [17-20] 18 (02/15 0936) BP: (123-136)/(81-93) 123/82 mmHg (02/15 0936) SpO2:  [97 %-100 %] 99 % (02/15 0936) Weight:  [197 lb 15.6 oz (89.8 kg)] 197 lb 15.6 oz (89.8 kg) (02/15 0929) Last BM Date: 10/04/13  Intake/Output from previous day: 02/14 0701 - 02/15 0700 In: 10  Out: 5593 [ZOXWR:6045[Urine:4475; Emesis/NG output:1000; Drains:117; Stool:1] Intake/Output this shift: Total I/O In: -  Out: 150 [Urine:150]    EXAM General appearance: overt. Cooperative. In no distress. Mental status normal. GI:  abdomen soft. Not tender. Not distended.  Dressing intact. All drainage purulent. Left-sided drains with minimal output.  Lab Results:  Results for orders placed during the hospital encounter of 08/28/13 (from the past 24 hour(s))  CLOSTRIDIUM DIFFICILE BY PCR     Status: None   Collection Time    10/04/13 10:11 AM      Result Value Ref Range   C difficile by pcr NEGATIVE  NEGATIVE  GLUCOSE, CAPILLARY     Status: Abnormal   Collection Time    10/04/13 11:45 AM      Result Value Ref Range   Glucose-Capillary 106 (*) 70 - 99 mg/dL  GLUCOSE, CAPILLARY     Status: None   Collection Time    10/04/13  6:31 PM      Result Value Ref Range   Glucose-Capillary 93  70 - 99 mg/dL  GLUCOSE, CAPILLARY     Status: Abnormal   Collection Time    10/05/13 12:24 AM      Result Value Ref Range   Glucose-Capillary 150 (*) 70 - 99 mg/dL  GLUCOSE, CAPILLARY     Status: None   Collection Time    10/05/13  6:29 AM      Result Value Ref Range   Glucose-Capillary 82  70 - 99 mg/dL     Studies/Results: @RISRSLT24 @  . sodium chloride   Intravenous Q24H  . antiseptic oral rinse  15 mL Mouth Rinse q12n4p  . ceFEPime (MAXIPIME) IV  2 g Intravenous Q12H  . chlorhexidine  15 mL Mouth Rinse BID  . enoxaparin (LOVENOX) injection  40 mg Subcutaneous Q24H  . insulin aspart  0-15 Units Subcutaneous 4 times per day  . metoprolol  7.5 mg Intravenous Q4H  . metronidazole  500 mg Intravenous Q6H  . micafungin (MYCAMINE) IV  100 mg Intravenous Daily  . pantoprazole (PROTONIX) IV  40 mg Intravenous Q12H  . sodium chloride  10-40 mL Intracatheter Q12H     Assessment/Plan: s/p Procedure(s): EXPLORATORY LAPAROTOMY,debridment of nacrotic stomach,and primary repair of perforated stomach.    Acute respiratory failure-resolved  NSTEMI-ischemia demand, stable, cards follow up  Acute systolic heart failure-echo 08/29/13 with diffuse hypokinesis with EF 40-45%, poor study, recommend repeat. Will need cardiology follow up.  Depression/anxiety  -psych consulted. WUJ:WJXBJYrec:resume sertraline 50mg  PO daily when tolerating orals.  Hx HTN-stable. Continue with metoprolol IV. Tachycardia improved.     Perforated gastric ulcer s/p exploratory laparotomy Dr. Andrey CampanileWilson 08/28/13  POD#37  -  s/p IR drain placement to RUQ x2. 2 additional surgical drains placed at time of surgery  -repeat CT still with extravasation, pelvic fluid collection resolved, fluid collection to dome of liver-->additional drain placed on 2/13...cultures negative at 2 days Started Maxipime 2/10--->STOP MONDAY.  Remains on Flagyl Micafungin 2/6--->STOP AFTER 2 WEEKS OF THERAPY. Indication: UGI fistula, multiple intra-abdominal fluid collections.   -midline wound has dehisced, continue with BID wet to dry dressing changes, abdominal binder  -Continue NGT to suction, TPN, NPO   -VTE prophylaxis; SCDs, lovenox   PCM    -continue with TPN   C. Diff colitis  -treatment completed. Continue with flagyl while on antibiotics to reduce recurrence   Followup C. Difficile negative.       @PROBHOSP @  LOS: 38 days    James Mccormick M 10/05/2013  . .prob

## 2013-10-06 LAB — CBC
HEMATOCRIT: 31.3 % — AB (ref 39.0–52.0)
HEMOGLOBIN: 10.6 g/dL — AB (ref 13.0–17.0)
MCH: 30 pg (ref 26.0–34.0)
MCHC: 33.9 g/dL (ref 30.0–36.0)
MCV: 88.7 fL (ref 78.0–100.0)
Platelets: 219 10*3/uL (ref 150–400)
RBC: 3.53 MIL/uL — ABNORMAL LOW (ref 4.22–5.81)
RDW: 15.1 % (ref 11.5–15.5)
WBC: 9.3 10*3/uL (ref 4.0–10.5)

## 2013-10-06 LAB — TRIGLYCERIDES: Triglycerides: 187 mg/dL — ABNORMAL HIGH (ref <150)

## 2013-10-06 LAB — PREALBUMIN: Prealbumin: 23.3 mg/dL (ref 17.0–34.0)

## 2013-10-06 LAB — COMPREHENSIVE METABOLIC PANEL
ALBUMIN: 2.2 g/dL — AB (ref 3.5–5.2)
ALT: 13 U/L (ref 0–53)
AST: 15 U/L (ref 0–37)
Alkaline Phosphatase: 94 U/L (ref 39–117)
BILIRUBIN TOTAL: 0.2 mg/dL — AB (ref 0.3–1.2)
BUN: 31 mg/dL — ABNORMAL HIGH (ref 6–23)
CALCIUM: 8.2 mg/dL — AB (ref 8.4–10.5)
CHLORIDE: 101 meq/L (ref 96–112)
CO2: 21 mEq/L (ref 19–32)
Creatinine, Ser: 0.75 mg/dL (ref 0.50–1.35)
GFR calc Af Amer: >90 mL/min (ref >90)
GFR calc non Af Amer: >90 mL/min (ref >90)
Glucose, Bld: 86 mg/dL (ref 70–99)
Potassium: 4.6 mEq/L (ref 3.7–5.3)
Sodium: 132 mEq/L — ABNORMAL LOW (ref 137–147)
Total Protein: 6.7 g/dL (ref 6.0–8.3)

## 2013-10-06 LAB — GLUCOSE, CAPILLARY
Glucose-Capillary: 104 mg/dL — ABNORMAL HIGH (ref 70–99)
Glucose-Capillary: 142 mg/dL — ABNORMAL HIGH (ref 70–99)
Glucose-Capillary: 152 mg/dL — ABNORMAL HIGH (ref 70–99)
Glucose-Capillary: 89 mg/dL (ref 70–99)
Glucose-Capillary: 93 mg/dL (ref 70–99)

## 2013-10-06 LAB — DIFFERENTIAL
Basophils Absolute: 0 10*3/uL (ref 0.0–0.1)
Basophils Relative: 0 % (ref 0–1)
Eosinophils Absolute: 0.1 10*3/uL (ref 0.0–0.7)
Eosinophils Relative: 1 % (ref 0–5)
LYMPHS PCT: 41 % (ref 12–46)
Lymphs Abs: 3.8 10*3/uL (ref 0.7–4.0)
Monocytes Absolute: 0.9 10*3/uL (ref 0.1–1.0)
Monocytes Relative: 10 % (ref 3–12)
NEUTROS ABS: 4.5 10*3/uL (ref 1.7–7.7)
Neutrophils Relative %: 48 % (ref 43–77)

## 2013-10-06 LAB — PHOSPHORUS: PHOSPHORUS: 4.6 mg/dL (ref 2.3–4.6)

## 2013-10-06 LAB — MAGNESIUM: Magnesium: 2 mg/dL (ref 1.5–2.5)

## 2013-10-06 MED ORDER — TRACE MINERALS CR-CU-F-FE-I-MN-MO-SE-ZN IV SOLN
INTRAVENOUS | Status: AC
  Administered 2013-10-06: 17:00:00 via INTRAVENOUS
  Filled 2013-10-06: qty 2400

## 2013-10-06 MED ORDER — FAT EMULSION 20 % IV EMUL
240.0000 mL | INTRAVENOUS | Status: AC
  Administered 2013-10-06: 240 mL via INTRAVENOUS
  Filled 2013-10-06 (×2): qty 250

## 2013-10-06 NOTE — Progress Notes (Signed)
Pt seen and examined  Bowel rest, long term TPN.  No good immediate solution.  See plan per PA note  Mary SellaEric M. Andrey CampanileWilson, MD, FACS General, Bariatric, & Minimally Invasive Surgery [facility], [place]

## 2013-10-06 NOTE — Progress Notes (Signed)
Physical Therapy Treatment Patient Details Name: James LeydenChristopher D Mccormick MRN: [id number] DOB: 10/13/1965 Today's Date: 10/06/2013 Time: 1050-1107 PT Time Calculation (min): 17 min  PT Assessment / Plan / Recommendation  History of Present Illness 48 yo male smoker presented with abdominal pain from perforated gastric ulcer in setting of NSAID.  PCCM consulted to assist with septic shock and respiratory failure management.  Reintubated  1/24 and reextubated 1/26.   PT Comments   Pt very motivated to improve mobility and increase activity level.  Pt had questions about UE theraband there ex he did with OT a couple days ago.  OT made aware.    Follow Up Recommendations  Home health PT     Does the patient have the potential to tolerate intense rehabilitation     Barriers to Discharge        Equipment Recommendations  Rolling walker with 5" wheels    Recommendations for Other Services    Frequency Min 3X/week   Progress towards PT Goals Progress towards PT goals: Progressing toward goals  Plan Current plan remains appropriate    Precautions / Restrictions Precautions Precautions: Fall Precaution Comments: watch HR (tachy) Other Brace/Splint: abdominal binder Restrictions Weight Bearing Restrictions: No   Pertinent Vitals/Pain Indicates minimal pain, more fatigue.      Mobility  Bed Mobility Overal bed mobility: Needs Assistance Bed Mobility: Sit to Supine Sit to supine: Supervision Transfers Overall transfer level: Needs assistance Equipment used: Rolling walker (2 wheeled) Transfers: Sit to/from Stand Sit to Stand: Supervision General transfer comment: cues to reinforce technique. Ambulation/Gait Ambulation/Gait assistance: Min guard Ambulation Distance (Feet): 150 Feet Assistive device: Rolling walker (2 wheeled) Gait Pattern/deviations: Step-through pattern;Decreased stride length;Trunk flexed General Gait Details: pt tends to lean on RW.  cues for upright posture and  decreased reliance on RW.      Exercises     PT Diagnosis:    PT Problem List:   PT Treatment Interventions:     PT Goals (current goals can now be found in the care plan section) Acute Rehab PT Goals Time For Goal Achievement: 10/08/13 Potential to Achieve Goals: Good  Visit Information  Last PT Received On: 10/06/13 Assistance Needed: +1 History of Present Illness: 48 yo male smoker presented with abdominal pain from perforated gastric ulcer in setting of NSAID.  PCCM consulted to assist with septic shock and respiratory failure management.  Reintubated  1/24 and reextubated 1/26.    Subjective Data  Subjective: I want to walk a couple times today.     Cognition  Cognition Arousal/Alertness: Awake/alert Behavior During Therapy: WFL for tasks assessed/performed Overall Cognitive Status: Within Functional Limits for tasks assessed    Balance  Balance Overall balance assessment: Needs assistance Standing balance support: No upper extremity supported Standing balance-Leahy Scale: Fair  End of Session PT - End of Session Activity Tolerance: Patient tolerated treatment well Patient left: in bed;with call bell/phone within reach;with nursing/sitter in room Nurse Communication: Mobility status   GP     James Mccormick, James Mccormick, [place] [phone] 10/06/2013, 3:00 PM

## 2013-10-06 NOTE — Progress Notes (Addendum)
PARENTERAL NUTRITION CONSULT NOTE - FOLLOW UP  Pharmacy Consult for TPN Indication: Prolonged ileus  No Known Allergies  Patient Measurements: Height: 5\' 11"  (180.3 cm) Weight: 200 lb 2.8 oz (90.8 kg) IBW/kg (Calculated) : 75.3 Adjusted Body Weight:  Usual Weight:   Vital Signs: Temp: 98.3 F (36.8 C) (02/16 1348) Temp src: Oral (02/16 0449) BP: 116/84 mmHg (02/16 1348) Pulse Rate: 105 (02/16 1348) Intake/Output from previous day: 02/15 0701 - 02/16 0700 In: 1367.7 [I.V.:557.7; IV Piggyback:800] Out: 2990 [Urine:2450; Emesis/NG output:450; Drains:90] Intake/Output from this shift: Total I/O In: 0  Out: 850 [Urine:850]  Labs:  Recent Labs  10/06/13 0610  WBC 9.3  HGB 10.6*  HCT 31.3*  PLT 219     Recent Labs  10/06/13 0610  NA 132*  K 4.6  CL 101  CO2 21  GLUCOSE 86  BUN 31*  CREATININE 0.75  CALCIUM 8.2*  MG 2.0  PHOS 4.6  PROT 6.7  ALBUMIN 2.2*  AST 15  ALT 13  ALKPHOS 94  BILITOT 0.2*  PREALBUMIN 23.3  TRIG 187*   Estimated Creatinine Clearance: 128.8 ml/min (by C-G formula based on Cr of 0.75).    Recent Labs  10/06/13 0031 10/06/13 0613 10/06/13 1220  GLUCAP 152* 89 104*    Medications:  Scheduled:  . sodium chloride   Intravenous Q24H  . antiseptic oral rinse  15 mL Mouth Rinse q12n4p  . chlorhexidine  15 mL Mouth Rinse BID  . enoxaparin (LOVENOX) injection  40 mg Subcutaneous Q24H  . insulin aspart  0-15 Units Subcutaneous 4 times per day  . metoprolol  7.5 mg Intravenous Q4H  . metronidazole  500 mg Intravenous Q6H  . micafungin (MYCAMINE) IV  100 mg Intravenous Daily  . pantoprazole (PROTONIX) IV  40 mg Intravenous Q12H  . sodium chloride  10-40 mL Intracatheter Q12H    Insulin Requirements in the past 24 hours:  40 units insulin in TPN + 6 units mod SSI on TPN  Current Nutrition:  Clinimix E 5/15 2400 mL over 12 hours + lipid emulsion 20% at 240 mL over 12 hr- provides 120 gm protein and 2352 kcal which is 92%  protein goals and 100% kcal goals  NPO   Nutritional Goals:  2250-2450 kcal and 130-140 gm protein per RD 2/3   Assessment:  48 y.o. male presented to ED 1/8 with abd pain. CT consistent with perforated stomach ulcer due to NSAID use, pneumoperitoneum, peritonitis. Taken emergently to OR where he underwent exp lap, debridement of necrotic stomach, repair of perforated stomach. Contrast study showed no leak from stomach or small bowel.   GI: CT scan 1/31 poss persistent gastric perf, fluid collection 4.5x4.9, and 3rd collection between bladder and rectum. 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 Will require NPO/NG/TNA for a long time- TPN has been transitioned to cyclic over 12 hours; abdominal drain- 90mL output in past 24 hours, NGT out.  Drains with thin, clear/milky output.  Midline wound dehisced- wet/dry dsg changes & abd binder.  (+)BS/flatus, abd NT, soft. Considering GJT for benefit of enteral feeding vs tpn   Endo: No h/o DM. CBGs 89,104 off TPN & 152,168 on TPN. 40 units of insulin in bag.   Lytes:  Na 132- stable, K 4.6, Mg 2, Phos 4.6- upward trend  Heptobili: LFTs wnl, albumin  low at 2.2, TG 187- down a bit,  Palb (p)- last wnl  Renal:  Cr < 1, UOP 1.34ml/kg/hr  ID:  WBC wnl,  afebrile; per ID note, cont Micafungin for total 14 days, to remain on IV Flagyl while on abx to prevent recurrence of CDiff.  CT guided asp'n of perihepatic fluid 2/13- concerning for infxn.  Repeat CDiff (-) on 2/14.  Cefepime stopped today.    Flagyl 1/21 >>  Micafungin 1/9>>1/21; 2/6 >> Primaxin 1/14>>1/23  Vanc PO 1/21>>1/31  Fluconazole 1/31>>2/6  Cefepime 2/10 >> 2/16 Vanc 1/8>>1/21 Zosyn 1/8>>1/14; 2/14 >> 2/10  2/12 intra-abd fluid: (+)yeast 2/6 peritoneal abscess: NF  2/1 peritoneal cavity: few candida glabrata  1/30 urine: NF  1/21 CDiff Positive  1/16 Trach aspirate: NF  1/9 Blood cultures: NF   TPN Access: triple lumen PICC 1/20; replaced 1/27  TPN day#: 37   Plan:   1. Continue Clinimix E 5/15 + full trace elements and multivitamins + 40 units of regular insulin at cyclic rate- 50 ml/hr x 1 hr, then run at 230 ml/hr x 10 hrs, then run at 50 ml/hr x1 hr for 12 hours total.  20% Lipids to run at 3720mL/hr for 12 hours.  2. Continue 1/2NS at 10-5420mL/hr ONLY when TPN is not running (0600-1800)  3. CBG checks on and off TPN  4. TPN labs as ordered  5. Follow up culture results and plans for feeding tube 6.  Watch Phosphorous  Marisue HumbleKendra Darrie Macmillan, PharmD Clinical Pharmacist [facility]

## 2013-10-06 NOTE — Progress Notes (Signed)
James [facility]cc[facility] from [facility] lab called report: Body fluid positive for rare yeast. States that she has already spoken with MD.

## 2013-10-06 NOTE — Progress Notes (Signed)
Patient ID: James LeydenChristopher D Mccormick, male   DOB: 02/15/1966, 48 y.o.   MRN: [id number] 39 Days Post-Op  Subjective: Pt without complaints  Objective: Vital signs in last 24 hours: Temp:  [98.1 F (36.7 C)-99.2 F (37.3 C)] 98.1 F (36.7 C) (02/16 0449) Pulse Rate:  [98-110] 107 (02/16 0449) Resp:  [16-18] 16 (02/16 0449) BP: (120-136)/(82-89) 123/86 mmHg (02/16 0449) SpO2:  [98 %-100 %] 98 % (02/16 0449) Weight:  [197 lb 15.6 oz (89.8 kg)-200 lb 2.8 oz (90.8 kg)] 200 lb 2.8 oz (90.8 kg) (02/16 0449) Last BM Date: 10/04/13  Intake/Output from previous day: 02/15 0701 - 02/16 0700 In: 1367.7 [I.V.:557.7; IV Piggyback:800] Out: 2990 [Urine:2450; Emesis/NG output:450; Drains:90] Intake/Output this shift:    PE: Abd: soft, wound is clean and packed.  All drains with thin, clear/milky output.  NGT with ice chips output only. Heart: tachy  Lab Results:   Recent Labs  10/06/13 0610  WBC 9.3  HGB 10.6*  HCT 31.3*  PLT 219   BMET  Recent Labs  10/06/13 0610  NA 132*  K 4.6  CL 101  CO2 21  GLUCOSE 86  BUN 31*  CREATININE 0.75  CALCIUM 8.2*   PT/INR No results found for this basename: LABPROT, INR,  in the last 72 hours CMP     Component Value Date/Time   NA 132* 10/06/2013 0610   K 4.6 10/06/2013 0610   CL 101 10/06/2013 0610   CO2 21 10/06/2013 0610   GLUCOSE 86 10/06/2013 0610   BUN 31* 10/06/2013 0610   CREATININE 0.75 10/06/2013 0610   CALCIUM 8.2* 10/06/2013 0610   PROT 6.7 10/06/2013 0610   ALBUMIN 2.2* 10/06/2013 0610   AST 15 10/06/2013 0610   ALT 13 10/06/2013 0610   ALKPHOS 94 10/06/2013 0610   BILITOT 0.2* 10/06/2013 0610   GFRNONAA >90 10/06/2013 0610   GFRAA >90 10/06/2013 0610   Lipase     Component Value Date/Time   LIPASE 14 08/29/2013 0930       Studies/Results: No results found.  Anti-infectives: Anti-infectives   Start     Dose/Rate Route Frequency Ordered Stop   09/30/13 1500  ceFEPIme (MAXIPIME) 2 g in dextrose 5 % 50 mL IVPB     2 g 100  mL/hr over 30 Minutes Intravenous Every 12 hours 09/30/13 1443     09/26/13 1800  vancomycin (VANCOCIN) 50 mg/mL oral solution 250 mg  Status:  Discontinued     250 mg Oral 4 times per day 09/26/13 1510 09/26/13 1554   09/26/13 1600  metroNIDAZOLE (FLAGYL) IVPB 500 mg     500 mg 100 mL/hr over 60 Minutes Intravenous Every 6 hours 09/26/13 1556     09/26/13 1515  micafungin (MYCAMINE) 100 mg in sodium chloride 0.9 % 100 mL IVPB     100 mg 100 mL/hr over 1 Hours Intravenous Daily 09/26/13 1510     09/24/13 0930  piperacillin-tazobactam (ZOSYN) IVPB 3.375 g  Status:  Discontinued     3.375 g 12.5 mL/hr over 240 Minutes Intravenous 3 times per day 09/24/13 0832 09/30/13 1443   09/22/13 1400  metroNIDAZOLE (FLAGYL) IVPB 500 mg  Status:  Discontinued     500 mg 100 mL/hr over 60 Minutes Intravenous Every 8 hours 09/22/13 1119 09/26/13 1510   09/20/13 1230  fluconazole (DIFLUCAN) IVPB 200 mg  Status:  Discontinued     200 mg 100 mL/hr over 60 Minutes Intravenous Every 24 hours 09/20/13 1154 09/26/13 1510  09/20/13 1200  fluconazole (DIFLUCAN) IVPB 100 mg  Status:  Discontinued     100 mg 50 mL/hr over 60 Minutes Intravenous Every 24 hours 09/20/13 1151 09/20/13 1154   09/10/13 2200  metroNIDAZOLE (FLAGYL) IVPB 500 mg  Status:  Discontinued     500 mg 100 mL/hr over 60 Minutes Intravenous 3 times per day 09/10/13 1802 09/22/13 1118   09/10/13 2000  vancomycin (VANCOCIN) 50 mg/mL oral solution 500 mg  Status:  Discontinued     500 mg Oral 4 times per day 09/10/13 1802 09/21/13 1151   09/10/13 1800  vancomycin (VANCOCIN) 50 mg/mL oral solution 500 mg  Status:  Discontinued     500 mg Oral 4 times per day 09/10/13 1432 09/10/13 1802   09/10/13 1600  metroNIDAZOLE (FLAGYL) IVPB 500 mg  Status:  Discontinued     500 mg 100 mL/hr over 60 Minutes Intravenous 3 times per day 09/10/13 1432 09/10/13 1802   09/09/13 1602  vancomycin (VANCOCIN) 1,250 mg in sodium chloride 0.9 % 250 mL IVPB  Status:   Discontinued     1,250 mg 166.7 mL/hr over 90 Minutes Intravenous Every 24 hours 09/09/13 1057 09/10/13 1432   09/06/13 1600  imipenem-cilastatin (PRIMAXIN) 500 mg in sodium chloride 0.9 % 100 mL IVPB  Status:  Discontinued     500 mg 200 mL/hr over 30 Minutes Intravenous 3 times per day 09/06/13 1310 09/12/13 1048   09/03/13 1100  imipenem-cilastatin (PRIMAXIN) 500 mg in sodium chloride 0.9 % 100 mL IVPB  Status:  Discontinued     500 mg 200 mL/hr over 30 Minutes Intravenous Every 6 hours 09/03/13 1007 09/06/13 1310   09/02/13 1300  vancomycin (VANCOCIN) IVPB 1000 mg/200 mL premix  Status:  Discontinued     1,000 mg 200 mL/hr over 60 Minutes Intravenous Every 12 hours 09/01/13 1527 09/07/13 1340   08/30/13 2000  vancomycin (VANCOCIN) IVPB 750 mg/150 ml premix  Status:  Discontinued     750 mg 150 mL/hr over 60 Minutes Intravenous Every 8 hours 08/30/13 1356 09/01/13 1527   08/29/13 2200  micafungin (MYCAMINE) 100 mg in sodium chloride 0.9 % 100 mL IVPB  Status:  Discontinued     100 mg 100 mL/hr over 1 Hours Intravenous Every 24 hours 08/29/13 2036 09/10/13 1432   08/29/13 0400  vancomycin (VANCOCIN) IVPB 1000 mg/200 mL premix  Status:  Discontinued     1,000 mg 200 mL/hr over 60 Minutes Intravenous Every 8 hours 08/28/13 2358 08/30/13 1356   08/29/13 0400  piperacillin-tazobactam (ZOSYN) IVPB 3.375 g  Status:  Discontinued     3.375 g 12.5 mL/hr over 240 Minutes Intravenous 3 times per day 08/28/13 2358 09/03/13 1002   08/28/13 1845  [MAR Hold]  vancomycin (VANCOCIN) IVPB 1000 mg/200 mL premix     (On MAR Hold since 08/28/13 1957)   1,000 mg 200 mL/hr over 60 Minutes Intravenous  Once 08/28/13 1831 08/28/13 1955   08/28/13 1845  [MAR Hold]  piperacillin-tazobactam (ZOSYN) IVPB 3.375 g     (On MAR Hold since 08/28/13 1957)   3.375 g 100 mL/hr over 30 Minutes Intravenous  Once 08/28/13 1831 08/28/13 2056       Assessment/Plan  Acute respiratory failure-resolved  NSTEMI-ischemia  demand, stable, cards follow up  Acute systolic heart failure-echo 08/29/13 with diffuse hypokinesis with EF 40-45%, poor study, recommend repeat. Will need cardiology follow up.  Septic shock 2/2 peritonitis-resolved  AKI-resolved  Acute encephalopathy-resolved  Depression/anxiety  -psych consulted. ZOX:WRUEAV  sertraline 50mg  PO daily when tolerating orals.  Anemia of critical illness-stable  Hypokalemia-resolved  Hx HTN-stable. Continue with metoprolol IV. Tachycardia improved.  Perforated gastric ulcer s/p exploratory laparotomy Dr. Andrey Campanile 08/28/13  POD#39  -s/p IR drain placement to RUQ x2. 2 additional surgical drains placed at time of surgery  -repeat CT still with extravasation, pelvic fluid collection resolved, fluid collection to dome of liver-->additional drain placed on 2/13  -stop Maxipime today.  Micafungin 2/6--->STOP AFTER 2 WEEKS OF THERAPY. Indication: UGI fistula, multiple intra-abdominal fluid collections.  -appreciate ID assistance  -midline wound has dehisced, continue with BID wet to dry dressing changes, abdominal binder  -Continue NGT to suction, TPN, NPO  -VTE prophylaxis; SCDs, lovenox  PCM  -continue with TPN  C. Diff colitis  -treatment completed. Continue with flagyl while on antibiotics to reduce recurrence     LOS: 39 days    Tyquisha Sharps E 10/06/2013, 8:17 AM Pager: [phone]

## 2013-10-06 NOTE — Progress Notes (Signed)
39 Days Post-Op  Subjective: Rt lat abd drain placed 2/6 Perihepatic drain placed 2/12 Output still significant but clearing in color Pt ambulating in halls last few days Feeling better    Objective: Vital signs in last 24 hours: Temp:  [98.1 F (36.7 C)-99.2 F (37.3 C)] 98.1 F (36.7 C) (02/16 0449) Pulse Rate:  [98-110] 107 (02/16 0449) Resp:  [16-18] 16 (02/16 0449) BP: (120-136)/(82-89) 123/86 mmHg (02/16 0449) SpO2:  [98 %-100 %] 98 % (02/16 0449) Weight:  [89.8 kg (197 lb 15.6 oz)-90.8 kg (200 lb 2.8 oz)] 90.8 kg (200 lb 2.8 oz) (02/16 0449) Last BM Date: 10/04/13  Intake/Output from previous day: 02/15 0701 - 02/16 0700 In: 1367.7 [I.V.:557.7; IV Piggyback:800] Out: 2990 [Urine:2450; Emesis/NG output:450; Drains:90] Intake/Output this shift:    PE:  Afeb; vss Pleasant All drains intact IR placed Rt abd drain and perihepatic drain abd drain output 35 cc yesterday; 10 cc in JP now- less milky- serous in color perihepatic drain output 45 cc yesterday; 10 cc in JP now; serous color now Sites are clean and dry; NT   Lab Results:   Recent Labs  10/06/13 0610  WBC 9.3  HGB 10.6*  HCT 31.3*  PLT 219   BMET  Recent Labs  10/06/13 0610  NA 132*  K 4.6  CL 101  CO2 21  GLUCOSE 86  BUN 31*  CREATININE 0.75  CALCIUM 8.2*   PT/INR No results found for this basename: LABPROT, INR,  in the last 72 hours ABG No results found for this basename: PHART, PCO2, PO2, HCO3,  in the last 72 hours  Studies/Results: No results found.  Anti-infectives: Anti-infectives   Start     Dose/Rate Route Frequency Ordered Stop   09/30/13 1500  ceFEPIme (MAXIPIME) 2 g in dextrose 5 % 50 mL IVPB     2 g 100 mL/hr over 30 Minutes Intravenous Every 12 hours 09/30/13 1443     09/26/13 1800  vancomycin (VANCOCIN) 50 mg/mL oral solution 250 mg  Status:  Discontinued     250 mg Oral 4 times per day 09/26/13 1510 09/26/13 1554   09/26/13 1600  metroNIDAZOLE (FLAGYL) IVPB 500  mg     500 mg 100 mL/hr over 60 Minutes Intravenous Every 6 hours 09/26/13 1556     09/26/13 1515  micafungin (MYCAMINE) 100 mg in sodium chloride 0.9 % 100 mL IVPB     100 mg 100 mL/hr over 1 Hours Intravenous Daily 09/26/13 1510     09/24/13 0930  piperacillin-tazobactam (ZOSYN) IVPB 3.375 g  Status:  Discontinued     3.375 g 12.5 mL/hr over 240 Minutes Intravenous 3 times per day 09/24/13 0832 09/30/13 1443   09/22/13 1400  metroNIDAZOLE (FLAGYL) IVPB 500 mg  Status:  Discontinued     500 mg 100 mL/hr over 60 Minutes Intravenous Every 8 hours 09/22/13 1119 09/26/13 1510   09/20/13 1230  fluconazole (DIFLUCAN) IVPB 200 mg  Status:  Discontinued     200 mg 100 mL/hr over 60 Minutes Intravenous Every 24 hours 09/20/13 1154 09/26/13 1510   09/20/13 1200  fluconazole (DIFLUCAN) IVPB 100 mg  Status:  Discontinued     100 mg 50 mL/hr over 60 Minutes Intravenous Every 24 hours 09/20/13 1151 09/20/13 1154   09/10/13 2200  metroNIDAZOLE (FLAGYL) IVPB 500 mg  Status:  Discontinued     500 mg 100 mL/hr over 60 Minutes Intravenous 3 times per day 09/10/13 1802 09/22/13 1118   09/10/13 2000  vancomycin (VANCOCIN) 50 mg/mL oral solution 500 mg  Status:  Discontinued     500 mg Oral 4 times per day 09/10/13 1802 09/21/13 1151   09/10/13 1800  vancomycin (VANCOCIN) 50 mg/mL oral solution 500 mg  Status:  Discontinued     500 mg Oral 4 times per day 09/10/13 1432 09/10/13 1802   09/10/13 1600  metroNIDAZOLE (FLAGYL) IVPB 500 mg  Status:  Discontinued     500 mg 100 mL/hr over 60 Minutes Intravenous 3 times per day 09/10/13 1432 09/10/13 1802   09/09/13 1602  vancomycin (VANCOCIN) 1,250 mg in sodium chloride 0.9 % 250 mL IVPB  Status:  Discontinued     1,250 mg 166.7 mL/hr over 90 Minutes Intravenous Every 24 hours 09/09/13 1057 09/10/13 1432   09/06/13 1600  imipenem-cilastatin (PRIMAXIN) 500 mg in sodium chloride 0.9 % 100 mL IVPB  Status:  Discontinued     500 mg 200 mL/hr over 30 Minutes  Intravenous 3 times per day 09/06/13 1310 09/12/13 1048   09/03/13 1100  imipenem-cilastatin (PRIMAXIN) 500 mg in sodium chloride 0.9 % 100 mL IVPB  Status:  Discontinued     500 mg 200 mL/hr over 30 Minutes Intravenous Every 6 hours 09/03/13 1007 09/06/13 1310   09/02/13 1300  vancomycin (VANCOCIN) IVPB 1000 mg/200 mL premix  Status:  Discontinued     1,000 mg 200 mL/hr over 60 Minutes Intravenous Every 12 hours 09/01/13 1527 09/07/13 1340   08/30/13 2000  vancomycin (VANCOCIN) IVPB 750 mg/150 ml premix  Status:  Discontinued     750 mg 150 mL/hr over 60 Minutes Intravenous Every 8 hours 08/30/13 1356 09/01/13 1527   08/29/13 2200  micafungin (MYCAMINE) 100 mg in sodium chloride 0.9 % 100 mL IVPB  Status:  Discontinued     100 mg 100 mL/hr over 1 Hours Intravenous Every 24 hours 08/29/13 2036 09/10/13 1432   08/29/13 0400  vancomycin (VANCOCIN) IVPB 1000 mg/200 mL premix  Status:  Discontinued     1,000 mg 200 mL/hr over 60 Minutes Intravenous Every 8 hours 08/28/13 2358 08/30/13 1356   08/29/13 0400  piperacillin-tazobactam (ZOSYN) IVPB 3.375 g  Status:  Discontinued     3.375 g 12.5 mL/hr over 240 Minutes Intravenous 3 times per day 08/28/13 2358 09/03/13 1002   08/28/13 1845  [MAR Hold]  vancomycin (VANCOCIN) IVPB 1000 mg/200 mL premix     (On MAR Hold since 08/28/13 1957)   1,000 mg 200 mL/hr over 60 Minutes Intravenous  Once 08/28/13 1831 08/28/13 1955   08/28/13 1845  [MAR Hold]  piperacillin-tazobactam (ZOSYN) IVPB 3.375 g     (On MAR Hold since 08/28/13 1957)   3.375 g 100 mL/hr over 30 Minutes Intravenous  Once 08/28/13 1831 08/28/13 2056      Assessment/Plan: s/p Procedure(s): EXPLORATORY LAPAROTOMY,debridment of nacrotic stomach,and primary repair of perforated stomach. (N/A)  Rt abd abscess drain and perihep drain intact Will follow Will need follow up CT when output is less than 10-15 cc/24 hrs   LOS: 39 days    Stephaun Million A 10/06/2013

## 2013-10-06 NOTE — Progress Notes (Signed)
Patient ID: James Mccormick, male   DOB: 1966-06-11, 48 y.o.   MRN: [id number]         [facility]    Date of Admission:  08/28/2013    Total days of antibiotics 39        Day 28 IV metronidazole        Day 14 gram-negative rod coverage        Day 11 micafungin Principal Problem:   Intra-abdominal abscess Active Problems:   Perforated gastric ulcer   ARDS (adult respiratory distress syndrome)   Hypocalcemia   Hypophosphatemia   Protein-calorie malnutrition, moderate   Atrial fibrillation   Pleural effusion   C. difficile colitis   Depression   Cigarette smoker   . sodium chloride   Intravenous Q24H  . antiseptic oral rinse  15 mL Mouth Rinse q12n4p  . chlorhexidine  15 mL Mouth Rinse BID  . enoxaparin (LOVENOX) injection  40 mg Subcutaneous Q24H  . insulin aspart  0-15 Units Subcutaneous 4 times per day  . metoprolol  7.5 mg Intravenous Q4H  . metronidazole  500 mg Intravenous Q6H  . micafungin (MYCAMINE) IV  100 mg Intravenous Daily  . pantoprazole (PROTONIX) IV  40 mg Intravenous Q12H  . sodium chloride  10-40 mL Intracatheter Q12H    Subjective: He is feeling better. He's not had any recent diarrhea.  Past Medical History  Diagnosis Date  . GERD (gastroesophageal reflux disease)   . Hypertension   . Depression   . Pleural effusion   . Dysrhythmia     ATRIAL FIBRILATION 08/2013    History  Substance Use Topics  . Smoking status: Current Every Day Smoker -- 1.00 packs/day    Types: Cigarettes  . Smokeless tobacco: Not on file  . Alcohol Use: No    Family History  Problem Relation Age of Onset  . Mental illness Mother   . Hypertension Sister   . Hypertension Brother     No Known Allergies  Objective: Temp:  [98.1 F (36.7 C)-99.2 F (37.3 C)] 98.1 F (36.7 C) (02/16 0449) Pulse Rate:  [98-110] 107 (02/16 0449) Resp:  [16-18] 16 (02/16 0449) BP: (120-136)/(82-89) 123/86 mmHg (02/16 0449) SpO2:  [98 %-100 %] 98 %  (02/16 0449) Weight:  [89.8 kg (197 lb 15.6 oz)-90.8 kg (200 lb 2.8 oz)] 90.8 kg (200 lb 2.8 oz) (02/16 0449)  General: He is in no distress watching TV Abdomen: Soft nontender. 90 cc out of drains yesterday. Right-sided drain output little thinner and clearer  Lab Results Lab Results  Component Value Date   WBC 9.3 10/06/2013   HGB 10.6* 10/06/2013   HCT 31.3* 10/06/2013   MCV 88.7 10/06/2013   PLT 219 10/06/2013    Lab Results  Component Value Date   CREATININE 0.75 10/06/2013   BUN 31* 10/06/2013   NA 132* 10/06/2013   K 4.6 10/06/2013   CL 101 10/06/2013   CO2 21 10/06/2013    Lab Results  Component Value Date   ALT 13 10/06/2013   AST 15 10/06/2013   ALKPHOS 94 10/06/2013   BILITOT 0.2* 10/06/2013    C difficile PCR 10/04/2013: Negative  Microbiology: Recent Results (from the past 240 hour(s))  CULTURE, ROUTINE-ABSCESS     Status: None   Collection Time    09/26/13  4:02 PM      Result Value Ref Range Status   Specimen Description ABSCESS PERITONEAL CAVITY   Final   Special Requests  RIGHT LATERAL ABOMINAL   Final   Gram Stain     Final   Value: ABUNDANT WBC PRESENT, PREDOMINANTLY PMN     NO SQUAMOUS EPITHELIAL CELLS SEEN     NO ORGANISMS SEEN     Performed at [facility]   Culture     Final   Value: NO GROWTH 3 DAYS     Performed at [facility]   Report Status 09/29/2013 FINAL   Final  BODY FLUID CULTURE     Status: None   Collection Time    10/02/13  1:53 PM      Result Value Ref Range Status   Specimen Description FLUID   Final   Special Requests     Final   Value: Normal INTRAABDOMINAL FLUID COLLECTION OVER THE LIVE DOME   Gram Stain     Final   Value: WBC PRESENT,BOTH PMN AND MONONUCLEAR     NO ORGANISMS SEEN     Performed at [facility]   Culture     Final   Value: Culture reincubated for better growth     Performed at [facility]   Report Status PENDING   Incomplete  CLOSTRIDIUM DIFFICILE BY PCR     Status: None    Collection Time    10/04/13 10:11 AM      Result Value Ref Range Status   C difficile by pcr NEGATIVE  NEGATIVE Final   Assessment: He is improving slowly on therapy for her polymicrobial intra-abdominal abscesses and C. difficile colitis. As last abscess culture from the February 12 showed a pinpoint colony that is probably a diphtheroid contaminant. I will stop cefepime now. I will continue IV metronidazole for at least one more week as terminal treatment for recent C. difficile colitis. I will continue micafungin at least through February 20 give him 2 weeks of treatment since first negative abscess culture.  Plan: 1. Continue metronidazole and micafungin  2. Discontinue cefepime 3. I will followup later this week  Cliffton AstersJohn Happy Begeman, MD [facility] [phone] pager   [phone] cell 10/06/2013, 8:51 AM

## 2013-10-07 LAB — GLUCOSE, CAPILLARY
GLUCOSE-CAPILLARY: 114 mg/dL — AB (ref 70–99)
GLUCOSE-CAPILLARY: 140 mg/dL — AB (ref 70–99)
Glucose-Capillary: 82 mg/dL (ref 70–99)
Glucose-Capillary: 79 mg/dL (ref 70–99)
Glucose-Capillary: 149 mg/dL — ABNORMAL HIGH (ref 70–99)

## 2013-10-07 MED ORDER — TRACE MINERALS CR-CU-F-FE-I-MN-MO-SE-ZN IV SOLN
INTRAVENOUS | Status: AC
  Administered 2013-10-07: 18:00:00 via INTRAVENOUS
  Filled 2013-10-07: qty 2400

## 2013-10-07 MED ORDER — FAT EMULSION 20 % IV EMUL
240.0000 mL | INTRAVENOUS | Status: AC
  Administered 2013-10-07: 240 mL via INTRAVENOUS
  Filled 2013-10-07 (×2): qty 250

## 2013-10-07 NOTE — Progress Notes (Signed)
PARENTERAL NUTRITION CONSULT NOTE - FOLLOW UP  Pharmacy Consult for TPN Indication: Prolonged ileus  No Known Allergies  Patient Measurements: Height: 5\' 11"  (180.3 cm) Weight: 195 lb 12.3 oz (88.8 kg) IBW/kg (Calculated) : 75.3 Adjusted Body Weight:  Usual Weight:   Vital Signs: Temp: 98.5 F (36.9 C) (02/17 0610) Temp src: Oral (02/17 0610) BP: 126/86 mmHg (02/17 0610) Pulse Rate: 112 (02/17 0610) Intake/Output from previous day: 02/16 0701 - 02/17 0700 In: 3441 [I.V.:76; IV Piggyback:274; TPN:3031] Out: 3130 [Urine:2875; Emesis/NG output:175; Drains:80] Intake/Output from this shift:    Labs:  Recent Labs  10/06/13 0610  WBC 9.3  HGB 10.6*  HCT 31.3*  PLT 219     Recent Labs  10/06/13 0610  NA 132*  K 4.6  CL 101  CO2 21  GLUCOSE 86  BUN 31*  CREATININE 0.75  CALCIUM 8.2*  MG 2.0  PHOS 4.6  PROT 6.7  ALBUMIN 2.2*  AST 15  ALT 13  ALKPHOS 94  BILITOT 0.2*  PREALBUMIN 23.3  TRIG 187*   Estimated Creatinine Clearance: 119 ml/min (by C-G formula based on Cr of 0.75).    Recent Labs  10/06/13 1951 10/07/13 0039 10/07/13 0602  GLUCAP 142* 140* 82    Medications:  Scheduled:  . sodium chloride   Intravenous Q24H  . antiseptic oral rinse  15 mL Mouth Rinse q12n4p  . chlorhexidine  15 mL Mouth Rinse BID  . enoxaparin (LOVENOX) injection  40 mg Subcutaneous Q24H  . insulin aspart  0-15 Units Subcutaneous 4 times per day  . metoprolol  7.5 mg Intravenous Q4H  . metronidazole  500 mg Intravenous Q6H  . micafungin (MYCAMINE) IV  100 mg Intravenous Daily  . pantoprazole (PROTONIX) IV  40 mg Intravenous Q12H  . sodium chloride  10-40 mL Intracatheter Q12H    Insulin Requirements in the past 24 hours:  40 units insulin in TPN + 4 units mod SSI on TPN  Current Nutrition:  Clinimix E 5/15 2400 mL over 12 hours + lipid emulsion 20% at 240 mL over 12 hr- provides 120 gm protein and 2352 kcal which is 92% protein goals and 100% kcal goals  NPO    Nutritional Goals:  2250-2450 kcal and 130-140 gm protein per RD 2/3   Assessment:  48 y.o. male presented to ED 1/8 with abd pain. CT consistent with perforated stomach ulcer due to NSAID use, pneumoperitoneum, peritonitis. Taken emergently to OR where he underwent exp lap, debridement of necrotic stomach, repair of perforated stomach. Contrast study showed no leak from stomach or small bowel.   GI: CT scan 1/31 poss persistent gastric perf, fluid collection 4.5x4.9, and 3rd collection between bladder and rectum. 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 Will require NPO/NG/TNA for a long time- TPN has been transitioned to cyclic over 12 hours; abdominal drain ~73mL output in past 24 hours, NGT out.  Drains with thin, clear/milky output.  Midline wound dehisced- wet/dry dsg changes & abd binder.  (+)BS/flatus, abd NT, soft. Considering GJT for benefit of enteral feeding vs tpn   Endo: No h/o DM. CBGs  82-89 off TPN & 142,140 on TPN. 40 units of insulin in bag.   Lytes:  No lytes today  Heptobili: LFTs wnl, albumin  low at 2.2, TG 187- down a bit,  Palb 23.3  Renal:  Cr < 1, UOP 1.41ml/kg/hr  ID:  WBC wnl, afebrile; per ID note, cont Micafungin for total 14 days, to remain on IV  Flagyl while on abx to prevent recurrence of CDiff.  CT guided asp'n of perihepatic fluid 2/13- cultures with yeast.  Repeat CDiff (-) on 2/14.  Cefepime stopped 2/16  Flagyl 1/21 >>  Micafungin 1/9>>1/21; 2/6 >> Primaxin 1/14>>1/23  Vanc PO 1/21>>1/31  Fluconazole 1/31>>2/6  Cefepime 2/10 >> 2/16 Vanc 1/8>>1/21 Zosyn 1/8>>1/14; 2/14 >> 2/10  2/12 intra-abd fluid: (+)yeast 2/6 peritoneal abscess: NF  2/1 peritoneal cavity: few candida glabrata  1/30 urine: NF  1/21 CDiff Positive  1/16 Trach aspirate: NF  1/9 Blood cultures: NF   TPN Access: triple lumen PICC 1/20; replaced 1/27  TPN day#: 38  Plan:  1. Continue Clinimix E 5/15 + full trace elements and multivitamins + 40 units of regular  insulin at cyclic rate- 50 ml/hr x 1 hr, then run at 230 ml/hr x 10 hrs, then run at 50 ml/hr x1 hr for 12 hours total.  20% Lipids to run at 6620mL/hr for 12 hours.  2. Continue 1/2NS at 10-3120mL/hr ONLY when TPN is not running (0600-1800)  3. CBG checks on and off TPN  4. TPN labs as ordered  5. Follow up culture results and plans for feeding tube 6.  Watch Phosphorous  Talbert CageLora Prabhav Faulkenberry, PharmD Clinical Pharmacist [facility]

## 2013-10-07 NOTE — Progress Notes (Signed)
Patient ID: James Mccormick, male   DOB: 01-11-1966, 48 y.o.   MRN: [id number]  Subjective: No changes.  Ambulating in hallways.  Tolerating TPN.  VSS.    Objective:  Vital signs:  Filed Vitals:   10/06/13 1348 10/06/13 2244 10/07/13 0500 10/07/13 0610  BP: 116/84 124/85  126/86  Pulse: 105 96  112  Temp: 98.3 F (36.8 C) 98.8 F (37.1 C)  98.5 F (36.9 C)  TempSrc:  Oral  Oral  Resp: '17 16  17  ' Height:      Weight:   195 lb 12.3 oz (88.8 kg)   SpO2: 99% 98%  97%    Last BM Date: 10/05/13  Intake/Output   Yesterday:  02/16 0701 - 02/17 0700 In: 4132 [I.V.:76; IV Piggyback:274; TPN:3031] Out: 4401 [Urine:2875; Emesis/NG output:175; Drains:80] This shift:    I/O last 3 completed shifts: In: 42 [I.V.:76; Other:60; IV Piggyback:274] Out: 0272 [ZDGUY:4034; Emesis/NG output:275; Drains:135]    Physical Exam:  General: Pt awake/alert/oriented and in no acute distress  Chest:CTA. No chest wall pain w good excursion  CV: Pulses intact. Regular rhythm. tachycardic  Bowel sounds are present. Non tender. Bilateral drains with purulent/milky output. Midline dressing is c/d/i. Midline incision is c/d/i.  Skin: No petechiae / purpura    Problem List:   Principal Problem:   Intra-abdominal abscess Active Problems:   Perforated gastric ulcer   ARDS (adult respiratory distress syndrome)   Hypocalcemia   Hypophosphatemia   Protein-calorie malnutrition, moderate   Atrial fibrillation   Pleural effusion   C. difficile colitis   Depression   Cigarette smoker    Results:   Labs: Results for orders placed during the hospital encounter of 08/28/13 (from the past 48 hour(s))  GLUCOSE, CAPILLARY     Status: Abnormal   Collection Time    10/05/13 11:47 AM      Result Value Ref Range   Glucose-Capillary 104 (*) 70 - 99 mg/dL  GLUCOSE, CAPILLARY     Status: Abnormal   Collection Time    10/05/13  8:01 PM      Result Value Ref Range   Glucose-Capillary 168 (*) 70 - 99  mg/dL   Comment 1 Notify RN    GLUCOSE, CAPILLARY     Status: Abnormal   Collection Time    10/06/13 12:31 AM      Result Value Ref Range   Glucose-Capillary 152 (*) 70 - 99 mg/dL   Comment 1 Notify RN    COMPREHENSIVE METABOLIC PANEL     Status: Abnormal   Collection Time    10/06/13  6:10 AM      Result Value Ref Range   Sodium 132 (*) 137 - 147 mEq/L   Potassium 4.6  3.7 - 5.3 mEq/L   Chloride 101  96 - 112 mEq/L   CO2 21  19 - 32 mEq/L   Glucose, Bld 86  70 - 99 mg/dL   BUN 31 (*) 6 - 23 mg/dL   Creatinine, Ser 0.75  0.50 - 1.35 mg/dL   Calcium 8.2 (*) 8.4 - 10.5 mg/dL   Total Protein 6.7  6.0 - 8.3 g/dL   Albumin 2.2 (*) 3.5 - 5.2 g/dL   AST 15  0 - 37 U/L   ALT 13  0 - 53 U/L   Alkaline Phosphatase 94  39 - 117 U/L   Total Bilirubin 0.2 (*) 0.3 - 1.2 mg/dL   GFR calc non Af Amer >90  >90 mL/min  GFR calc Af Amer >90  >90 mL/min   Comment: (NOTE)     The eGFR has been calculated using the CKD EPI equation.     This calculation has not been validated in all clinical situations.     eGFR's persistently <90 mL/min signify possible Chronic Kidney     Disease.  MAGNESIUM     Status: None   Collection Time    10/06/13  6:10 AM      Result Value Ref Range   Magnesium 2.0  1.5 - 2.5 mg/dL  PHOSPHORUS     Status: None   Collection Time    10/06/13  6:10 AM      Result Value Ref Range   Phosphorus 4.6  2.3 - 4.6 mg/dL  CBC     Status: Abnormal   Collection Time    10/06/13  6:10 AM      Result Value Ref Range   WBC 9.3  4.0 - 10.5 K/uL   RBC 3.53 (*) 4.22 - 5.81 MIL/uL   Hemoglobin 10.6 (*) 13.0 - 17.0 g/dL   HCT 31.3 (*) 39.0 - 52.0 %   MCV 88.7  78.0 - 100.0 fL   MCH 30.0  26.0 - 34.0 pg   MCHC 33.9  30.0 - 36.0 g/dL   RDW 15.1  11.5 - 15.5 %   Platelets 219  150 - 400 K/uL  DIFFERENTIAL     Status: None   Collection Time    10/06/13  6:10 AM      Result Value Ref Range   Neutrophils Relative % 48  43 - 77 %   Lymphocytes Relative 41  12 - 46 %   Monocytes  Relative 10  3 - 12 %   Eosinophils Relative 1  0 - 5 %   Basophils Relative 0  0 - 1 %   Neutro Abs 4.5  1.7 - 7.7 K/uL   Lymphs Abs 3.8  0.7 - 4.0 K/uL   Monocytes Absolute 0.9  0.1 - 1.0 K/uL   Eosinophils Absolute 0.1  0.0 - 0.7 K/uL   Basophils Absolute 0.0  0.0 - 0.1 K/uL   WBC Morphology ATYPICAL LYMPHOCYTES    PREALBUMIN     Status: None   Collection Time    10/06/13  6:10 AM      Result Value Ref Range   Prealbumin 23.3  17.0 - 34.0 mg/dL   Comment: Performed at [facility]     Status: Abnormal   Collection Time    10/06/13  6:10 AM      Result Value Ref Range   Triglycerides 187 (*) <150 mg/dL  GLUCOSE, CAPILLARY     Status: None   Collection Time    10/06/13  6:13 AM      Result Value Ref Range   Glucose-Capillary 89  70 - 99 mg/dL  GLUCOSE, CAPILLARY     Status: Abnormal   Collection Time    10/06/13 12:20 PM      Result Value Ref Range   Glucose-Capillary 104 (*) 70 - 99 mg/dL  GLUCOSE, CAPILLARY     Status: None   Collection Time    10/06/13  6:03 PM      Result Value Ref Range   Glucose-Capillary 93  70 - 99 mg/dL  GLUCOSE, CAPILLARY     Status: Abnormal   Collection Time    10/06/13  7:51 PM      Result Value Ref Range  Glucose-Capillary 142 (*) 70 - 99 mg/dL   Comment 1 Notify RN    GLUCOSE, CAPILLARY     Status: Abnormal   Collection Time    10/07/13 12:39 AM      Result Value Ref Range   Glucose-Capillary 140 (*) 70 - 99 mg/dL   Comment 1 Notify RN    GLUCOSE, CAPILLARY     Status: None   Collection Time    10/07/13  6:02 AM      Result Value Ref Range   Glucose-Capillary 82  70 - 99 mg/dL    Imaging / Studies: No results found.  Medications / Allergies: per chart  Antibiotics: Anti-infectives   Start     Dose/Rate Route Frequency Ordered Stop   09/30/13 1500  ceFEPIme (MAXIPIME) 2 g in dextrose 5 % 50 mL IVPB  Status:  Discontinued     2 g 100 mL/hr over 30 Minutes Intravenous Every 12 hours 09/30/13 1443  10/06/13 0822   09/26/13 1800  vancomycin (VANCOCIN) 50 mg/mL oral solution 250 mg  Status:  Discontinued     250 mg Oral 4 times per day 09/26/13 1510 09/26/13 1554   09/26/13 1600  metroNIDAZOLE (FLAGYL) IVPB 500 mg     500 mg 100 mL/hr over 60 Minutes Intravenous Every 6 hours 09/26/13 1556     09/26/13 1515  micafungin (MYCAMINE) 100 mg in sodium chloride 0.9 % 100 mL IVPB     100 mg 100 mL/hr over 1 Hours Intravenous Daily 09/26/13 1510     09/24/13 0930  piperacillin-tazobactam (ZOSYN) IVPB 3.375 g  Status:  Discontinued     3.375 g 12.5 mL/hr over 240 Minutes Intravenous 3 times per day 09/24/13 0832 09/30/13 1443   09/22/13 1400  metroNIDAZOLE (FLAGYL) IVPB 500 mg  Status:  Discontinued     500 mg 100 mL/hr over 60 Minutes Intravenous Every 8 hours 09/22/13 1119 09/26/13 1510   09/20/13 1230  fluconazole (DIFLUCAN) IVPB 200 mg  Status:  Discontinued     200 mg 100 mL/hr over 60 Minutes Intravenous Every 24 hours 09/20/13 1154 09/26/13 1510   09/20/13 1200  fluconazole (DIFLUCAN) IVPB 100 mg  Status:  Discontinued     100 mg 50 mL/hr over 60 Minutes Intravenous Every 24 hours 09/20/13 1151 09/20/13 1154   09/10/13 2200  metroNIDAZOLE (FLAGYL) IVPB 500 mg  Status:  Discontinued     500 mg 100 mL/hr over 60 Minutes Intravenous 3 times per day 09/10/13 1802 09/22/13 1118   09/10/13 2000  vancomycin (VANCOCIN) 50 mg/mL oral solution 500 mg  Status:  Discontinued     500 mg Oral 4 times per day 09/10/13 1802 09/21/13 1151   09/10/13 1800  vancomycin (VANCOCIN) 50 mg/mL oral solution 500 mg  Status:  Discontinued     500 mg Oral 4 times per day 09/10/13 1432 09/10/13 1802   09/10/13 1600  metroNIDAZOLE (FLAGYL) IVPB 500 mg  Status:  Discontinued     500 mg 100 mL/hr over 60 Minutes Intravenous 3 times per day 09/10/13 1432 09/10/13 1802   09/09/13 1602  vancomycin (VANCOCIN) 1,250 mg in sodium chloride 0.9 % 250 mL IVPB  Status:  Discontinued     1,250 mg 166.7 mL/hr over 90 Minutes  Intravenous Every 24 hours 09/09/13 1057 09/10/13 1432   09/06/13 1600  imipenem-cilastatin (PRIMAXIN) 500 mg in sodium chloride 0.9 % 100 mL IVPB  Status:  Discontinued     500 mg 200 mL/hr over 30  Minutes Intravenous 3 times per day 09/06/13 1310 09/12/13 1048   09/03/13 1100  imipenem-cilastatin (PRIMAXIN) 500 mg in sodium chloride 0.9 % 100 mL IVPB  Status:  Discontinued     500 mg 200 mL/hr over 30 Minutes Intravenous Every 6 hours 09/03/13 1007 09/06/13 1310   09/02/13 1300  vancomycin (VANCOCIN) IVPB 1000 mg/200 mL premix  Status:  Discontinued     1,000 mg 200 mL/hr over 60 Minutes Intravenous Every 12 hours 09/01/13 1527 09/07/13 1340   08/30/13 2000  vancomycin (VANCOCIN) IVPB 750 mg/150 ml premix  Status:  Discontinued     750 mg 150 mL/hr over 60 Minutes Intravenous Every 8 hours 08/30/13 1356 09/01/13 1527   08/29/13 2200  micafungin (MYCAMINE) 100 mg in sodium chloride 0.9 % 100 mL IVPB  Status:  Discontinued     100 mg 100 mL/hr over 1 Hours Intravenous Every 24 hours 08/29/13 2036 09/10/13 1432   08/29/13 0400  vancomycin (VANCOCIN) IVPB 1000 mg/200 mL premix  Status:  Discontinued     1,000 mg 200 mL/hr over 60 Minutes Intravenous Every 8 hours 08/28/13 2358 08/30/13 1356   08/29/13 0400  piperacillin-tazobactam (ZOSYN) IVPB 3.375 g  Status:  Discontinued     3.375 g 12.5 mL/hr over 240 Minutes Intravenous 3 times per day 08/28/13 2358 09/03/13 1002   08/28/13 1845  [MAR Hold]  vancomycin (VANCOCIN) IVPB 1000 mg/200 mL premix     (On MAR Hold since 08/28/13 1957)   1,000 mg 200 mL/hr over 60 Minutes Intravenous  Once 08/28/13 1831 08/28/13 1955   08/28/13 1845  [MAR Hold]  piperacillin-tazobactam (ZOSYN) IVPB 3.375 g     (On MAR Hold since 08/28/13 1957)   3.375 g 100 mL/hr over 30 Minutes Intravenous  Once 08/28/13 1831 08/28/13 2056      Assessment/Plan  Acute respiratory failure-resolved  NSTEMI-ischemia demand, stable, cards follow up  Acute systolic heart  failure-echo 08/29/13 with diffuse hypokinesis with EF 40-45%, poor study, recommend repeat. Will need cardiology follow up.  Septic shock 2/2 peritonitis-resolved  AKI-resolved  Acute encephalopathy-resolved  Depression/anxiety  -psych consulted. WUX:LKGMWN sertraline 32m PO daily when tolerating orals.  Anemia of critical illness-stable  Hypokalemia-resolved  Hx HTN-stable. Continue with metoprolol IV. Tachycardia improved.  Perforated gastric ulcer s/p exploratory laparotomy Dr. WRedmond Pulling1/8/15  POD#40  -s/p IR drain placement to RUQ x2. 2 additional surgical drains placed at time of surgery  -repeat CT still with extravasation, pelvic fluid collection resolved, fluid collection to dome of liver-->additional drain placed on 2/13  -stop Maxipime today. Micafungin 2/6--->STOP AFTER 2 WEEKS OF THERAPY. Indication: UGI fistula, multiple intra-abdominal fluid collections.  -will need a repeat CT at some point -appreciate ID assistance  -midline wound has dehisced, continue with BID wet to dry dressing changes, abdominal binder  -Continue NGT to suction, TPN, NPO  -VTE prophylaxis; SCDs, lovenox  PCM  -continue with TPN  C. Diff colitis  -treatment completed. Continue with flagyl while on antibiotics to reduce recurrence   EErby Pian AMorledge Family Surgery CenterSurgery Pager [phone]) Office [phone] 10/07/2013 7:32 AM

## 2013-10-07 NOTE — Progress Notes (Signed)
Pt seen and examined  Agree with IV antifungal - most recent drain aspiration has yeast Cont current care  Mary SellaEric M. Andrey CampanileWilson, MD, FACS General, Bariatric, & Minimally Invasive Surgery [facility], [place]

## 2013-10-07 NOTE — Progress Notes (Signed)
Occupational Therapy Treatment Patient Details Name: Pamalee LeydenChristopher D Brekke MRN: [id number] DOB: 06/28/1966 Today's Date: 10/07/2013 Time: [id number] OT Time Calculation (min): 23 min  OT Assessment / Plan / Recommendation  History of present illness 48 yo male smoker presented with abdominal pain from perforated gastric ulcer in setting of NSAID.  PCCM consulted to assist with septic shock and respiratory failure management.  Reintubated  1/24 and reextubated 1/26.   OT comments  Awake and eager for con't. Therapy.  Reports he has already amb. Up/down entire length of hall x2 with nursing staff today.  Very motivated.  Reviewed b ue exercises per pt. Request.  Able to return demo and tolerated well.    Follow Up Recommendations  Home health OT;Supervision/Assistance - 24 hour           Equipment Recommendations  3 in 1 bedside comode    Recommendations for Other Services Rehab consult  Frequency Min 2X/week   Progress towards OT Goals Progress towards OT goals: Progressing toward goals  Plan Discharge plan remains appropriate    Precautions / Restrictions Precautions Precautions: Fall Precaution Comments: watch HR (tachy) Required Braces or Orthoses: Other Brace/Splint Other Brace/Splint: abdominal binder   Pertinent Vitals/Pain Denies any pain today    ADL         OT Goals(current goals can now be found in the care plan section)    Visit Information  Last OT Received On: 10/07/13 History of Present Illness: 48 yo male smoker presented with abdominal pain from perforated gastric ulcer in setting of NSAID.  PCCM consulted to assist with septic shock and respiratory failure management.  Reintubated  1/24 and reextubated 1/26.    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 able to perform bicep and shoulder flexion exercieses. reviewed tieing  them to bed rails and how to increase resistance duirng use.  also reviewed proper position/tech. during shoulder flexion. 3 sets of 15 of each plane/direction level 2 theraband       End of Session OT - End of Session Activity Tolerance: Patient tolerated treatment well Patient left: in bed;with call bell/phone within reach       Robet LeuMorris, Milia Warth Lorraine, COTA/L 10/07/2013, 1:39 PM

## 2013-10-08 LAB — GLUCOSE, CAPILLARY
GLUCOSE-CAPILLARY: 112 mg/dL — AB (ref 70–99)
GLUCOSE-CAPILLARY: 141 mg/dL — AB (ref 70–99)
GLUCOSE-CAPILLARY: 155 mg/dL — AB (ref 70–99)
GLUCOSE-CAPILLARY: 85 mg/dL (ref 70–99)
Glucose-Capillary: 151 mg/dL — ABNORMAL HIGH (ref 70–99)
Glucose-Capillary: 130 mg/dL — ABNORMAL HIGH (ref 70–99)
Glucose-Capillary: 137 mg/dL — ABNORMAL HIGH (ref 70–99)

## 2013-10-08 MED ORDER — M.V.I. ADULT IV INJ
INTRAVENOUS | Status: AC
  Administered 2013-10-08: 18:00:00 via INTRAVENOUS
  Filled 2013-10-08: qty 2400

## 2013-10-08 MED ORDER — FAT EMULSION 20 % IV EMUL
240.0000 mL | INTRAVENOUS | Status: AC
  Administered 2013-10-08: 240 mL via INTRAVENOUS
  Filled 2013-10-08 (×2): qty 250

## 2013-10-08 NOTE — Progress Notes (Addendum)
NUTRITION FOLLOW UP  Intervention:   TPN per pharmacy; recommend increasing TPN slightly as pt continues to lose weight Recommend GJ-tube for benefit of enteral feeding vs tpn; recommend using Vital AF 1.2 RD to follow for nutrition care plan  Nutrition Dx:   Inadequate oral intake now related to inability to eat as evidenced by NPO status, ongoing  Goal:   Pt to meet >/= 90% of their estimated nutrition needs, met  Monitor:   TPN prescription, PO diet advancement, weight, labs, I/O's  Assessment:   Patient with PMH of GERD, HTN presented with severe abdominal pain associated with nausea & vomiting; had been taking Alleve and large amount of Goody's powder.   CT scan revealed perforated stomach ulcer due to NSAID use.   Patient s/p procedures 1/8:  EXPLORATORY LAPAROTOMY  DEBRIDEMENT OF NECROTIC STOMACH  REPAIR OF PERFORATED STOMACH   Patient tested positive for C difficile 1/21. IV Flagyl & PO Vancomycin added.  Patient s/p FEES 1/23.  Demonstrated mild dysphagia.  Advanced to Clear Liquids 1/27, Full Liquids 1/28. Patient diet advanced to dysphagia 3, thin liquids 1/29.  Patient's current diet is NPO as of 1/31. 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  Patient continues to receive cyclic TPN with Clinimix E 5/15 + lipids over 12 hours to provide 2400 ml. Provides 2184 kcal, 120 gm protein daily. Meets 97% minimum estimated energy needs and 92% minimum estimated protein needs. Weight continues to trend down; 7 lb weight loss from 2/12 to 2/18.  Pt has drains and NGT in place. Per MD note, JP drains all stable with clear/milky output. NGT with some old bloody output that just started this morning. Stage 2 pressure ulcer on posterior head.   Labs reviewed. Noted last prealbumin WNL. Triglycerides elevated 2/16.  Potassium, magnesium, and phosphorus are WNL. Sodium, calcium, and hemoglobin are low.    Height: 08/28/13  '5\' 11"'  (1.803 m)   Weight Status:   Wt Readings from Last 1 Encounters:  10/08/13 196 lb 3.4 oz (89 kg)  02/06  206 lb 02/03  200 lb 01/28  215 lb 01/27  220 lb 01/26   220 lb 01/24   221 lb  01/23   222 lb  01/22   230 lb  01/21   233 lb  01/20   252 lb  01/19   261 lb  01/18   276 lb  01/17   278 lb  01/16   279 lb  01/12   262 lb   Admission weight: 225 lb  Body mass index is 27.38 kg/(m^2).   Re-estimated needs:  Kcal: 2250-2450 Protein: 130-140 gm Fluid: 2.2-2.4 L  Skin: abdominal surgical incision; 3 closed system drains; +1 generalized edema, +1 RLE and LLE edema, +1perineal edema; stage 2 pressure ulcer on posterior head  Diet Order: NPO    Intake/Output Summary (Last 24 hours) at 10/08/13 1238 Last data filed at 10/08/13 0900  Gross per 24 hour  Intake   3165 ml  Output   2160 ml  Net   1005 ml    Last BM: 2/17, diarrhea  Labs:   Recent Labs Lab 10/02/13 0615 10/06/13 0610  NA 132* 132*  K 4.4 4.6  CL 100 101  CO2 20 21  BUN 29* 31*  CREATININE 0.87 0.75  CALCIUM 7.7*  8.2*  MG 1.9 2.0  PHOS 3.9 4.6  GLUCOSE 99 86    CBG (last 3)   Recent Labs  10/08/13 0437 10/08/13 0625 10/08/13 1202  GLUCAP 151* 112* 130*    Scheduled Meds: . sodium chloride   Intravenous Q24H  . antiseptic oral rinse  15 mL Mouth Rinse q12n4p  . chlorhexidine  15 mL Mouth Rinse BID  . enoxaparin (LOVENOX) injection  40 mg Subcutaneous Q24H  . insulin aspart  0-15 Units Subcutaneous 4 times per day  . metoprolol  7.5 mg Intravenous Q4H  . metronidazole  500 mg Intravenous Q6H  . micafungin (MYCAMINE) IV  100 mg Intravenous Daily  . pantoprazole (PROTONIX) IV  40 mg Intravenous Q12H  . sodium chloride  10-40 mL Intracatheter Q12H    Continuous Infusions: . Marland KitchenTPN (CLINIMIX-E) Adult 50 mL/hr at 10/07/13 1755   And  . fat emulsion 240 mL  (10/07/13 1756)  . Marland KitchenTPN (CLINIMIX-E) Adult     And  . fat emulsion      Pryor Ochoa RD, LDN Inpatient Clinical Dietitian Pager: [phone] After Hours Pager: [phone]

## 2013-10-08 NOTE — Progress Notes (Signed)
Physical Therapy Treatment Patient Details Name: James Mccormick MRN: [id number] DOB: [birth year] Today's Date: 10/08/2013 Time: 4098-1191 PT Time Calculation (min): 19 min  PT Assessment / Plan / Recommendation  History of Present Illness 48 yo male smoker presented with abdominal pain from perforated gastric ulcer in setting of NSAID.  PCCM consulted to assist with septic shock and respiratory failure management.  Reintubated  1/24 and reextubated 1/26.   PT Comments   Pt progressing very well with mobility.  Ambulates with RW at Mod I level, therefore had pt ambulate short distance without AD and only requires supervision for safety.  Feel pt may not need RW at D/C, however pt may request one for increased confidence with gait.     Follow Up Recommendations  Home health PT     Does the patient have the potential to tolerate intense rehabilitation     Barriers to Discharge        Equipment Recommendations   (do not feel he will need RW, however pt may request one)    Recommendations for Other Services    Frequency Min 3X/week   Progress towards PT Goals Progress towards PT goals: Progressing toward goals  Plan Current plan remains appropriate    Precautions / Restrictions Precautions Precautions: Fall Precaution Comments: watch HR (tachy) Required Braces or Orthoses: Other Brace/Splint Other Brace/Splint: abdominal binder Restrictions Weight Bearing Restrictions: No Other Position/Activity Restrictions: NG tube   Pertinent Vitals/Pain Pt with only mild c/o pain in abdomen during session.     Mobility  Bed Mobility Overal bed mobility: Modified Independent Bed Mobility: Supine to Sit General bed mobility comments: Pt able to perform independently, however requires increased time and use of handrails.  Transfers Overall transfer level: Needs assistance Equipment used: Rolling walker (2 wheeled) Transfers: Sit to/from Stand Sit to Stand: Supervision General transfer  comment: Supervision for safety.  Ambulation/Gait Ambulation/Gait assistance: Supervision;Modified independent (Device/Increase time) Ambulation Distance (Feet): 200 Feet (x2 reps) Assistive device: None;Rolling walker (2 wheeled) Gait Pattern/deviations: Step-through pattern;WFL(Within Functional Limits) General Gait Details: Pt requested to use RW for first rep of gait training.  Note that he was not using very heavily, therefore asked if he would ambulate without it.  He states "I don't have enough confidence yet."  Ambulates with RW at Mod I level and on way back to room, did convince pt to ambulate approx 45' without RW at supervision level.      Exercises Other Exercises Other Exercises: Pt states he has done arm exercises with theraband prior to PT session.    PT Diagnosis:    PT Problem List:   PT Treatment Interventions:     PT Goals (current goals can now be found in the care plan section) Acute Rehab PT Goals PT Goal Formulation: With patient Time For Goal Achievement: 10/15/13 Potential to Achieve Goals: Good  Visit Information  Last PT Received On: 10/08/13 Assistance Needed: +1 History of Present Illness: 48 yo male smoker presented with abdominal pain from perforated gastric ulcer in setting of NSAID.  PCCM consulted to assist with septic shock and respiratory failure management.  Reintubated  1/24 and reextubated 1/26.    Subjective Data      Cognition  Cognition Arousal/Alertness: Awake/alert Behavior During Therapy: WFL for tasks assessed/performed Overall Cognitive Status: Within Functional Limits for tasks assessed    Balance     End of Session PT - End of Session Activity Tolerance: Patient tolerated treatment well Patient left: in chair;with call bell/phone  within reach Nurse Communication: Mobility status   GP     Vista Deckarcell, Andriea Hasegawa Ann 10/08/2013, 10:30 AM

## 2013-10-08 NOTE — Progress Notes (Signed)
PARENTERAL NUTRITION CONSULT NOTE - FOLLOW UP  Pharmacy Consult for TPN Indication: Prolonged ileus  No Known Allergies  Patient Measurements: Height: 5\' 11"  (180.3 cm) Weight: 196 lb 3.4 oz (89 kg) IBW/kg (Calculated) : 75.3    Vital Signs: Temp: 98.4 F (36.9 C) (02/18 0438) Temp src: Oral (02/18 0438) BP: 125/83 mmHg (02/18 0438) Pulse Rate: 110 (02/18 0438) Intake/Output from previous day: 02/17 0701 - 02/18 0700 In: 3165 [I.V.:365.3; TPN:2759.7] Out: 2455 [Urine:2350; Drains:105] Intake/Output from this shift:    Labs:  Recent Labs  10/06/13 0610  WBC 9.3  HGB 10.6*  HCT 31.3*  PLT 219     Recent Labs  10/06/13 0610  NA 132*  K 4.6  CL 101  CO2 21  GLUCOSE 86  BUN 31*  CREATININE 0.75  CALCIUM 8.2*  MG 2.0  PHOS 4.6  PROT 6.7  ALBUMIN 2.2*  AST 15  ALT 13  ALKPHOS 94  BILITOT 0.2*  PREALBUMIN 23.3  TRIG 187*   Estimated Creatinine Clearance: 119 ml/min (by C-G formula based on Cr of 0.75).    Recent Labs  10/08/13 0012 10/08/13 0437 10/08/13 0625  GLUCAP 155* 151* 112*    Medications:  Scheduled:  . sodium chloride   Intravenous Q24H  . antiseptic oral rinse  15 mL Mouth Rinse q12n4p  . chlorhexidine  15 mL Mouth Rinse BID  . enoxaparin (LOVENOX) injection  40 mg Subcutaneous Q24H  . insulin aspart  0-15 Units Subcutaneous 4 times per day  . metoprolol  7.5 mg Intravenous Q4H  . metronidazole  500 mg Intravenous Q6H  . micafungin (MYCAMINE) IV  100 mg Intravenous Daily  . pantoprazole (PROTONIX) IV  40 mg Intravenous Q12H  . sodium chloride  10-40 mL Intracatheter Q12H    Insulin Requirements in the past 24 hours:  40 units insulin in TPN + 5 units mod SSI on TPN  Current Nutrition:  Clinimix E 5/15 2400 mL over 12 hours + lipid emulsion 20% at 240 mL over 12 hr- provides 120 gm protein and 2352 kcal which is 92% protein goals and 100% kcal goals  NPO   Nutritional Goals:  2250-2450 kcal and 130-140 gm protein per RD  2/3   Assessment:  48 y.o. male presented to ED 1/8 with abd pain. CT consistent with perforated stomach ulcer due to NSAID use, pneumoperitoneum, peritonitis. Taken emergently to OR where he underwent exp lap, debridement of necrotic stomach, repair of perforated stomach. Contrast study showed no leak from stomach or small bowel.   GI: CT scan 1/31 poss persistent gastric perf, fluid collection 4.5x4.9, and 3rd collection between bladder and rectum. 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 Will require NPO/NG/TNA for a long time- TPN has been transitioned to cyclic over 12 hours; Abdominal drains X 2 with clear/milky output.  NGT with some old bloody output that started this am  Considering GJT for benefit of enteral feeding vs tpn   Endo: No h/o DM. CBGs  82 off TPN & 149 on TPN. 40 units of insulin in bag.   Lytes:  No lytes today  Heptobili: LFTs wnl, albumin  low at 2.2, TG 187- down a bit,  Palb 23.3  Renal:  Cr < 1, UOP 1.351ml/kg/hr  ID:  WBC wnl, afebrile; per ID note, cont Micafungin for total 14 days, to remain on IV Flagyl while on abx to prevent recurrence of CDiff.  CT guided asp'n of perihepatic fluid 2/13- cultures with yeast.  Repeat  CDiff (-) on 2/14.  Cefepime stopped 2/16  Flagyl 1/21 >>  Micafungin 1/9>>1/21; 2/6 >> Primaxin 1/14>>1/23  Vanc PO 1/21>>1/31  Fluconazole 1/31>>2/6  Cefepime 2/10 >> 2/16 Vanc 1/8>>1/21 Zosyn 1/8>>1/14; 2/14 >> 2/10  2/12 intra-abd fluid: (+)yeast 2/6 peritoneal abscess: NF  2/1 peritoneal cavity: few candida glabrata  1/30 urine: NF  1/21 CDiff Positive  1/16 Trach aspirate: NF  1/9 Blood cultures: NF   TPN Access: triple lumen PICC 1/20; replaced 1/27  TPN day#: 39  Plan:  1. Continue Clinimix E 5/15 + full trace elements and multivitamins + 40 units of regular insulin at cyclic rate- 50 ml/hr x 1 hr, then run at 230 ml/hr x 10 hrs, then run at 50 ml/hr x1 hr for 12 hours total.  20% Lipids to run at 75mL/hr for 12 hours.  2.  Continue 1/2NS at 10-33mL/hr ONLY when TPN is not running (0600-1800)  3. CBG checks on and off TPN  4. TPN labs as ordered  5. Follow up culture results and plans for feeding tube 6.  Watch Phosphorous  Talbert Cage, PharmD Clinical Pharmacist [facility]

## 2013-10-08 NOTE — Progress Notes (Signed)
41 Days Post-Op  Subjective: Gastric ulcer perforation Surgical intervention 08/28/2013 Rt lat abd abscess drain placed 2/6 Perihepatic abscess drain placed 2/12 Pt better daily; ambulating   Objective: Vital signs in last 24 hours: Temp:  [98.4 F (36.9 C)-98.6 F (37 C)] 98.4 F (36.9 C) (02/18 0438) Pulse Rate:  [102-110] 110 (02/18 0438) Resp:  [18] 18 (02/18 0438) BP: (125-133)/(83-90) 125/83 mmHg (02/18 0438) SpO2:  [97 %-100 %] 97 % (02/18 0438) Weight:  [89 kg (196 lb 3.4 oz)] 89 kg (196 lb 3.4 oz) (02/18 0500) Last BM Date: 10/05/13  Intake/Output from previous day: 02/17 0701 - 02/18 0700 In: 3165 [I.V.:365.3; TPN:2759.7] Out: 2455 [Urine:2350; Drains:105] Intake/Output this shift:    PE:  Afeb; vss Both drains intact Sites clean and dry R abd drain output 40 cc yesterday; 10 cc in JP now: serous Perihep drain output 35 cc yesterday; 10 cc in JP now: serous: + yeast   Lab Results:   Recent Labs  10/06/13 0610  WBC 9.3  HGB 10.6*  HCT 31.3*  PLT 219   BMET  Recent Labs  10/06/13 0610  NA 132*  K 4.6  CL 101  CO2 21  GLUCOSE 86  BUN 31*  CREATININE 0.75  CALCIUM 8.2*   PT/INR No results found for this basename: LABPROT, INR,  in the last 72 hours ABG No results found for this basename: PHART, PCO2, PO2, HCO3,  in the last 72 hours  Studies/Results: No results found.  Anti-infectives: Anti-infectives   Start     Dose/Rate Route Frequency Ordered Stop   09/30/13 1500  ceFEPIme (MAXIPIME) 2 g in dextrose 5 % 50 mL IVPB  Status:  Discontinued     2 g 100 mL/hr over 30 Minutes Intravenous Every 12 hours 09/30/13 1443 10/06/13 0822   09/26/13 1800  vancomycin (VANCOCIN) 50 mg/mL oral solution 250 mg  Status:  Discontinued     250 mg Oral 4 times per day 09/26/13 1510 09/26/13 1554   09/26/13 1600  metroNIDAZOLE (FLAGYL) IVPB 500 mg     500 mg 100 mL/hr over 60 Minutes Intravenous Every 6 hours 09/26/13 1556     09/26/13 1515  micafungin  (MYCAMINE) 100 mg in sodium chloride 0.9 % 100 mL IVPB     100 mg 100 mL/hr over 1 Hours Intravenous Daily 09/26/13 1510     09/24/13 0930  piperacillin-tazobactam (ZOSYN) IVPB 3.375 g  Status:  Discontinued     3.375 g 12.5 mL/hr over 240 Minutes Intravenous 3 times per day 09/24/13 0832 09/30/13 1443   09/22/13 1400  metroNIDAZOLE (FLAGYL) IVPB 500 mg  Status:  Discontinued     500 mg 100 mL/hr over 60 Minutes Intravenous Every 8 hours 09/22/13 1119 09/26/13 1510   09/20/13 1230  fluconazole (DIFLUCAN) IVPB 200 mg  Status:  Discontinued     200 mg 100 mL/hr over 60 Minutes Intravenous Every 24 hours 09/20/13 1154 09/26/13 1510   09/20/13 1200  fluconazole (DIFLUCAN) IVPB 100 mg  Status:  Discontinued     100 mg 50 mL/hr over 60 Minutes Intravenous Every 24 hours 09/20/13 1151 09/20/13 1154   09/10/13 2200  metroNIDAZOLE (FLAGYL) IVPB 500 mg  Status:  Discontinued     500 mg 100 mL/hr over 60 Minutes Intravenous 3 times per day 09/10/13 1802 09/22/13 1118   09/10/13 2000  vancomycin (VANCOCIN) 50 mg/mL oral solution 500 mg  Status:  Discontinued     500 mg Oral 4 times per  day 09/10/13 1802 09/21/13 1151   09/10/13 1800  vancomycin (VANCOCIN) 50 mg/mL oral solution 500 mg  Status:  Discontinued     500 mg Oral 4 times per day 09/10/13 1432 09/10/13 1802   09/10/13 1600  metroNIDAZOLE (FLAGYL) IVPB 500 mg  Status:  Discontinued     500 mg 100 mL/hr over 60 Minutes Intravenous 3 times per day 09/10/13 1432 09/10/13 1802   09/09/13 1602  vancomycin (VANCOCIN) 1,250 mg in sodium chloride 0.9 % 250 mL IVPB  Status:  Discontinued     1,250 mg 166.7 mL/hr over 90 Minutes Intravenous Every 24 hours 09/09/13 1057 09/10/13 1432   09/06/13 1600  imipenem-cilastatin (PRIMAXIN) 500 mg in sodium chloride 0.9 % 100 mL IVPB  Status:  Discontinued     500 mg 200 mL/hr over 30 Minutes Intravenous 3 times per day 09/06/13 1310 09/12/13 1048   09/03/13 1100  imipenem-cilastatin (PRIMAXIN) 500 mg in  sodium chloride 0.9 % 100 mL IVPB  Status:  Discontinued     500 mg 200 mL/hr over 30 Minutes Intravenous Every 6 hours 09/03/13 1007 09/06/13 1310   09/02/13 1300  vancomycin (VANCOCIN) IVPB 1000 mg/200 mL premix  Status:  Discontinued     1,000 mg 200 mL/hr over 60 Minutes Intravenous Every 12 hours 09/01/13 1527 09/07/13 1340   08/30/13 2000  vancomycin (VANCOCIN) IVPB 750 mg/150 ml premix  Status:  Discontinued     750 mg 150 mL/hr over 60 Minutes Intravenous Every 8 hours 08/30/13 1356 09/01/13 1527   08/29/13 2200  micafungin (MYCAMINE) 100 mg in sodium chloride 0.9 % 100 mL IVPB  Status:  Discontinued     100 mg 100 mL/hr over 1 Hours Intravenous Every 24 hours 08/29/13 2036 09/10/13 1432   08/29/13 0400  vancomycin (VANCOCIN) IVPB 1000 mg/200 mL premix  Status:  Discontinued     1,000 mg 200 mL/hr over 60 Minutes Intravenous Every 8 hours 08/28/13 2358 08/30/13 1356   08/29/13 0400  piperacillin-tazobactam (ZOSYN) IVPB 3.375 g  Status:  Discontinued     3.375 g 12.5 mL/hr over 240 Minutes Intravenous 3 times per day 08/28/13 2358 09/03/13 1002   08/28/13 1845  [MAR Hold]  vancomycin (VANCOCIN) IVPB 1000 mg/200 mL premix     (On MAR Hold since 08/28/13 1957)   1,000 mg 200 mL/hr over 60 Minutes Intravenous  Once 08/28/13 1831 08/28/13 1955   08/28/13 1845  [MAR Hold]  piperacillin-tazobactam (ZOSYN) IVPB 3.375 g     (On MAR Hold since 08/28/13 1957)   3.375 g 100 mL/hr over 30 Minutes Intravenous  Once 08/28/13 1831 08/28/13 2056      Assessment/Plan: s/p Procedure(s): EXPLORATORY LAPAROTOMY,debridment of nacrotic stomach,and primary repair of perforated stomach. (N/A)  Rt lat abd abscess drain 2/6 Perihepatic abscess drain 2/12 Pt better daily Plan per [facility] CT next few days   LOS: 41 days    James Mccormick A 10/08/2013

## 2013-10-08 NOTE — Progress Notes (Signed)
Discussed with PA Social visit  Plan repeat CT on Friday  Sade Hollon M. Andrey CampanileWilson, MD, FACS General, Bariatric, & Minimally Invasive Surgery [facility] [place]

## 2013-10-08 NOTE — Progress Notes (Signed)
Patient ID: Pamalee LeydenChristopher D Devlin, male   DOB: 06/23/1966, 48 y.o.   MRN: [id number] 41 Days Post-Op  Subjective: Patient ok except c/o pain around left sided JP drain  Objective: Vital signs in last 24 hours: Temp:  [98.4 F (36.9 C)-98.6 F (37 C)] 98.4 F (36.9 C) (02/18 0438) Pulse Rate:  [102-110] 110 (02/18 0438) Resp:  [18] 18 (02/18 0438) BP: (125-133)/(83-90) 125/83 mmHg (02/18 0438) SpO2:  [97 %-100 %] 97 % (02/18 0438) Weight:  [196 lb 3.4 oz (89 kg)] 196 lb 3.4 oz (89 kg) (02/18 0500) Last BM Date: 10/07/13  Intake/Output from previous day: 02/17 0701 - 02/18 0700 In: 3165 [I.V.:365.3; TPN:2759.7] Out: 2455 [Urine:2350; Drains:105] Intake/Output this shift:    PE: Abd: soft, wound is stable, JP drains all stable with clear/milky output.  No infection or drain around any of the drain sites.  NGT with some old bloody output that just started this morning.  Turned suction down slightly to 80-19700mmHG  Lab Results:   Recent Labs  10/06/13 0610  WBC 9.3  HGB 10.6*  HCT 31.3*  PLT 219   BMET  Recent Labs  10/06/13 0610  NA 132*  K 4.6  CL 101  CO2 21  GLUCOSE 86  BUN 31*  CREATININE 0.75  CALCIUM 8.2*   PT/INR No results found for this basename: LABPROT, INR,  in the last 72 hours CMP     Component Value Date/Time   NA 132* 10/06/2013 0610   K 4.6 10/06/2013 0610   CL 101 10/06/2013 0610   CO2 21 10/06/2013 0610   GLUCOSE 86 10/06/2013 0610   BUN 31* 10/06/2013 0610   CREATININE 0.75 10/06/2013 0610   CALCIUM 8.2* 10/06/2013 0610   PROT 6.7 10/06/2013 0610   ALBUMIN 2.2* 10/06/2013 0610   AST 15 10/06/2013 0610   ALT 13 10/06/2013 0610   ALKPHOS 94 10/06/2013 0610   BILITOT 0.2* 10/06/2013 0610   GFRNONAA >90 10/06/2013 0610   GFRAA >90 10/06/2013 0610   Lipase     Component Value Date/Time   LIPASE 14 08/29/2013 0930       Studies/Results: No results found.  Anti-infectives: Anti-infectives   Start     Dose/Rate Route Frequency Ordered Stop   09/30/13 1500  ceFEPIme (MAXIPIME) 2 g in dextrose 5 % 50 mL IVPB  Status:  Discontinued     2 g 100 mL/hr over 30 Minutes Intravenous Every 12 hours 09/30/13 1443 10/06/13 0822   09/26/13 1800  vancomycin (VANCOCIN) 50 mg/mL oral solution 250 mg  Status:  Discontinued     250 mg Oral 4 times per day 09/26/13 1510 09/26/13 1554   09/26/13 1600  metroNIDAZOLE (FLAGYL) IVPB 500 mg     500 mg 100 mL/hr over 60 Minutes Intravenous Every 6 hours 09/26/13 1556     09/26/13 1515  micafungin (MYCAMINE) 100 mg in sodium chloride 0.9 % 100 mL IVPB     100 mg 100 mL/hr over 1 Hours Intravenous Daily 09/26/13 1510     09/24/13 0930  piperacillin-tazobactam (ZOSYN) IVPB 3.375 g  Status:  Discontinued     3.375 g 12.5 mL/hr over 240 Minutes Intravenous 3 times per day 09/24/13 0832 09/30/13 1443   09/22/13 1400  metroNIDAZOLE (FLAGYL) IVPB 500 mg  Status:  Discontinued     500 mg 100 mL/hr over 60 Minutes Intravenous Every 8 hours 09/22/13 1119 09/26/13 1510   09/20/13 1230  fluconazole (DIFLUCAN) IVPB 200 mg  Status:  Discontinued     200 mg 100 mL/hr over 60 Minutes Intravenous Every 24 hours 09/20/13 1154 09/26/13 1510   09/20/13 1200  fluconazole (DIFLUCAN) IVPB 100 mg  Status:  Discontinued     100 mg 50 mL/hr over 60 Minutes Intravenous Every 24 hours 09/20/13 1151 09/20/13 1154   09/10/13 2200  metroNIDAZOLE (FLAGYL) IVPB 500 mg  Status:  Discontinued     500 mg 100 mL/hr over 60 Minutes Intravenous 3 times per day 09/10/13 1802 09/22/13 1118   09/10/13 2000  vancomycin (VANCOCIN) 50 mg/mL oral solution 500 mg  Status:  Discontinued     500 mg Oral 4 times per day 09/10/13 1802 09/21/13 1151   09/10/13 1800  vancomycin (VANCOCIN) 50 mg/mL oral solution 500 mg  Status:  Discontinued     500 mg Oral 4 times per day 09/10/13 1432 09/10/13 1802   09/10/13 1600  metroNIDAZOLE (FLAGYL) IVPB 500 mg  Status:  Discontinued     500 mg 100 mL/hr over 60 Minutes Intravenous 3 times per day 09/10/13 1432  09/10/13 1802   09/09/13 1602  vancomycin (VANCOCIN) 1,250 mg in sodium chloride 0.9 % 250 mL IVPB  Status:  Discontinued     1,250 mg 166.7 mL/hr over 90 Minutes Intravenous Every 24 hours 09/09/13 1057 09/10/13 1432   09/06/13 1600  imipenem-cilastatin (PRIMAXIN) 500 mg in sodium chloride 0.9 % 100 mL IVPB  Status:  Discontinued     500 mg 200 mL/hr over 30 Minutes Intravenous 3 times per day 09/06/13 1310 09/12/13 1048   09/03/13 1100  imipenem-cilastatin (PRIMAXIN) 500 mg in sodium chloride 0.9 % 100 mL IVPB  Status:  Discontinued     500 mg 200 mL/hr over 30 Minutes Intravenous Every 6 hours 09/03/13 1007 09/06/13 1310   09/02/13 1300  vancomycin (VANCOCIN) IVPB 1000 mg/200 mL premix  Status:  Discontinued     1,000 mg 200 mL/hr over 60 Minutes Intravenous Every 12 hours 09/01/13 1527 09/07/13 1340   08/30/13 2000  vancomycin (VANCOCIN) IVPB 750 mg/150 ml premix  Status:  Discontinued     750 mg 150 mL/hr over 60 Minutes Intravenous Every 8 hours 08/30/13 1356 09/01/13 1527   08/29/13 2200  micafungin (MYCAMINE) 100 mg in sodium chloride 0.9 % 100 mL IVPB  Status:  Discontinued     100 mg 100 mL/hr over 1 Hours Intravenous Every 24 hours 08/29/13 2036 09/10/13 1432   08/29/13 0400  vancomycin (VANCOCIN) IVPB 1000 mg/200 mL premix  Status:  Discontinued     1,000 mg 200 mL/hr over 60 Minutes Intravenous Every 8 hours 08/28/13 2358 08/30/13 1356   08/29/13 0400  piperacillin-tazobactam (ZOSYN) IVPB 3.375 g  Status:  Discontinued     3.375 g 12.5 mL/hr over 240 Minutes Intravenous 3 times per day 08/28/13 2358 09/03/13 1002   08/28/13 1845  [MAR Hold]  vancomycin (VANCOCIN) IVPB 1000 mg/200 mL premix     (On MAR Hold since 08/28/13 1957)   1,000 mg 200 mL/hr over 60 Minutes Intravenous  Once 08/28/13 1831 08/28/13 1955   08/28/13 1845  [MAR Hold]  piperacillin-tazobactam (ZOSYN) IVPB 3.375 g     (On MAR Hold since 08/28/13 1957)   3.375 g 100 mL/hr over 30 Minutes Intravenous  Once  08/28/13 1831 08/28/13 2056       Assessment/Plan   Acute respiratory failure-resolved  NSTEMI-ischemia demand, stable, cards follow up  Acute systolic heart failure-echo 08/29/13 with diffuse hypokinesis with EF 40-45%, poor study,  recommend repeat. Will need cardiology follow up.  Septic shock 2/2 peritonitis-resolved  AKI-resolved  Acute encephalopathy-resolved  Depression/anxiety  -psych consulted. LKG:MWNUUV sertraline 50mg  PO daily when tolerating orals.  Anemia of critical illness-stable  Hypokalemia-resolved  Hx HTN-stable. Continue with metoprolol IV. Tachycardia improved.  Perforated gastric ulcer s/p exploratory laparotomy Dr. Andrey Campanile 08/28/13  POD#41  -s/p IR drain placement to RUQ x2. 2 additional surgical drains placed at time of surgery  -repeat CT still with extravasation, pelvic fluid collection resolved, fluid collection to dome of liver-->additional drain placed on 2/13  -Micafungin 2/6--->STOP AFTER 2 WEEKS OF THERAPY. Indication: UGI fistula, multiple intra-abdominal fluid collections.  may need to continue treat a little longer due to new culture with yeast for second drain, but may not. Will d/w MD  -will need a repeat CT on Friday -midline wound has dehisced, continue with BID wet to dry dressing changes, abdominal binder  -Continue NGT to suction, TPN, NPO  -VTE prophylaxis; SCDs, lovenox  PCM  -continue with TPN  C. Diff colitis  -treatment completed. Continue with flagyl while on antibiotics to reduce recurrence    LOS: 41 days    Nyiah Pianka E 10/08/2013, 9:05 AM Pager: [phone]

## 2013-10-09 LAB — MAGNESIUM: Magnesium: 1.8 mg/dL (ref 1.5–2.5)

## 2013-10-09 LAB — COMPREHENSIVE METABOLIC PANEL
ALBUMIN: 2.3 g/dL — AB (ref 3.5–5.2)
ALK PHOS: 89 U/L (ref 39–117)
ALT: 13 U/L (ref 0–53)
AST: 15 U/L (ref 0–37)
BUN: 30 mg/dL — ABNORMAL HIGH (ref 6–23)
CALCIUM: 8.5 mg/dL (ref 8.4–10.5)
CO2: 24 mEq/L (ref 19–32)
Chloride: 101 mEq/L (ref 96–112)
Creatinine, Ser: 0.67 mg/dL (ref 0.50–1.35)
GFR calc non Af Amer: >90 mL/min (ref >90)
GLUCOSE: 111 mg/dL — AB (ref 70–99)
Potassium: 4.5 mEq/L (ref 3.7–5.3)
SODIUM: 135 meq/L — AB (ref 137–147)
TOTAL PROTEIN: 6.7 g/dL (ref 6.0–8.3)
Total Bilirubin: 0.3 mg/dL (ref 0.3–1.2)

## 2013-10-09 LAB — GLUCOSE, CAPILLARY
GLUCOSE-CAPILLARY: 105 mg/dL — AB (ref 70–99)
GLUCOSE-CAPILLARY: 114 mg/dL — AB (ref 70–99)
GLUCOSE-CAPILLARY: 170 mg/dL — AB (ref 70–99)
Glucose-Capillary: 154 mg/dL — ABNORMAL HIGH (ref 70–99)

## 2013-10-09 LAB — PHOSPHORUS: Phosphorus: 4.5 mg/dL (ref 2.3–4.6)

## 2013-10-09 MED ORDER — FAT EMULSION 20 % IV EMUL
240.0000 mL | INTRAVENOUS | Status: AC
  Administered 2013-10-09: 240 mL via INTRAVENOUS
  Filled 2013-10-09 (×2): qty 250

## 2013-10-09 MED ORDER — TRACE MINERALS CR-CU-F-FE-I-MN-MO-SE-ZN IV SOLN
INTRAVENOUS | Status: AC
  Administered 2013-10-09: 17:00:00 via INTRAVENOUS
  Filled 2013-10-09: qty 2700

## 2013-10-09 MED ORDER — FAT EMULSION 20 % IV EMUL
240.0000 mL | INTRAVENOUS | Status: DC
  Filled 2013-10-09 (×2): qty 250

## 2013-10-09 MED ORDER — TRACE MINERALS CR-CU-F-FE-I-MN-MO-SE-ZN IV SOLN
INTRAVENOUS | Status: DC
  Filled 2013-10-09: qty 2400

## 2013-10-09 NOTE — Progress Notes (Signed)
42 Days Post-Op  Subjective: Pt states pain has generally improved from previous days.  He states he is able to walk to the end of the hall 3-4 times a day with minimal difficulty.  He reports using his spirometry 4-5 times every hour and demonstrates at bedside to a value of 1250.  He reports he has been passing gas regularly with intermittent episodes of diarrhea.  He states he is generally less tender, and dressing changes have become easier to tolerate.  He currently has no complaints.  Objective: Vital signs in last 24 hours: Temp:  [98.1 F (36.7 C)-99.3 F (37.4 C)] 98.7 F (37.1 C) (02/19 0517) Pulse Rate:  [106-116] 112 (02/19 0517) Resp:  [17-18] 17 (02/19 0517) BP: (122-141)/(90-99) 122/90 mmHg (02/19 0517) SpO2:  [97 %-99 %] 97 % (02/19 0517) Last BM Date: 10/08/13  Intake/Output from previous day: 02/18 0701 - 02/19 0700 In: 3306 [I.V.:567; ZOX:0960] Out: 3450 [Urine:2675; Emesis/NG output:720; Drains:55] Intake/Output this shift:    PE: Gen:  Alert, NAD, pleasant Abd: Soft, NT/ND, +BS, Wound covered by dressing and corset.  Drain#1: Purulent whitish-yellow drainage Drain#2: Clear drainage.  RLQ Drain: Light yellow, minimal drainage. Suprahepatic drain: Yellow, some tissue in tubing.   Lab Results:  No results found for this basename: WBC, HGB, HCT, PLT,  in the last 72 hours BMET  Recent Labs  10/09/13 0555  NA 135*  K 4.5  CL 101  CO2 24  GLUCOSE 111*  BUN 30*  CREATININE 0.67  CALCIUM 8.5   PT/INR No results found for this basename: LABPROT, INR,  in the last 72 hours CMP     Component Value Date/Time   NA 135* 10/09/2013 0555   K 4.5 10/09/2013 0555   CL 101 10/09/2013 0555   CO2 24 10/09/2013 0555   GLUCOSE 111* 10/09/2013 0555   BUN 30* 10/09/2013 0555   CREATININE 0.67 10/09/2013 0555   CALCIUM 8.5 10/09/2013 0555   PROT 6.7 10/09/2013 0555   ALBUMIN 2.3* 10/09/2013 0555   AST 15 10/09/2013 0555   ALT 13 10/09/2013 0555   ALKPHOS 89 10/09/2013 0555    BILITOT 0.3 10/09/2013 0555   GFRNONAA >90 10/09/2013 0555   GFRAA >90 10/09/2013 0555   Lipase     Component Value Date/Time   LIPASE 14 08/29/2013 0930       Studies/Results: No results found.  Anti-infectives: Anti-infectives   Start     Dose/Rate Route Frequency Ordered Stop   09/30/13 1500  ceFEPIme (MAXIPIME) 2 g in dextrose 5 % 50 mL IVPB  Status:  Discontinued     2 g 100 mL/hr over 30 Minutes Intravenous Every 12 hours 09/30/13 1443 10/06/13 0822   09/26/13 1800  vancomycin (VANCOCIN) 50 mg/mL oral solution 250 mg  Status:  Discontinued     250 mg Oral 4 times per day 09/26/13 1510 09/26/13 1554   09/26/13 1600  metroNIDAZOLE (FLAGYL) IVPB 500 mg     500 mg 100 mL/hr over 60 Minutes Intravenous Every 6 hours 09/26/13 1556     09/26/13 1515  micafungin (MYCAMINE) 100 mg in sodium chloride 0.9 % 100 mL IVPB     100 mg 100 mL/hr over 1 Hours Intravenous Daily 09/26/13 1510     09/24/13 0930  piperacillin-tazobactam (ZOSYN) IVPB 3.375 g  Status:  Discontinued     3.375 g 12.5 mL/hr over 240 Minutes Intravenous 3 times per day 09/24/13 0832 09/30/13 1443   09/22/13 1400  metroNIDAZOLE (FLAGYL) IVPB  500 mg  Status:  Discontinued     500 mg 100 mL/hr over 60 Minutes Intravenous Every 8 hours 09/22/13 1119 09/26/13 1510   09/20/13 1230  fluconazole (DIFLUCAN) IVPB 200 mg  Status:  Discontinued     200 mg 100 mL/hr over 60 Minutes Intravenous Every 24 hours 09/20/13 1154 09/26/13 1510   09/20/13 1200  fluconazole (DIFLUCAN) IVPB 100 mg  Status:  Discontinued     100 mg 50 mL/hr over 60 Minutes Intravenous Every 24 hours 09/20/13 1151 09/20/13 1154   09/10/13 2200  metroNIDAZOLE (FLAGYL) IVPB 500 mg  Status:  Discontinued     500 mg 100 mL/hr over 60 Minutes Intravenous 3 times per day 09/10/13 1802 09/22/13 1118   09/10/13 2000  vancomycin (VANCOCIN) 50 mg/mL oral solution 500 mg  Status:  Discontinued     500 mg Oral 4 times per day 09/10/13 1802 09/21/13 1151   09/10/13  1800  vancomycin (VANCOCIN) 50 mg/mL oral solution 500 mg  Status:  Discontinued     500 mg Oral 4 times per day 09/10/13 1432 09/10/13 1802   09/10/13 1600  metroNIDAZOLE (FLAGYL) IVPB 500 mg  Status:  Discontinued     500 mg 100 mL/hr over 60 Minutes Intravenous 3 times per day 09/10/13 1432 09/10/13 1802   09/09/13 1602  vancomycin (VANCOCIN) 1,250 mg in sodium chloride 0.9 % 250 mL IVPB  Status:  Discontinued     1,250 mg 166.7 mL/hr over 90 Minutes Intravenous Every 24 hours 09/09/13 1057 09/10/13 1432   09/06/13 1600  imipenem-cilastatin (PRIMAXIN) 500 mg in sodium chloride 0.9 % 100 mL IVPB  Status:  Discontinued     500 mg 200 mL/hr over 30 Minutes Intravenous 3 times per day 09/06/13 1310 09/12/13 1048   09/03/13 1100  imipenem-cilastatin (PRIMAXIN) 500 mg in sodium chloride 0.9 % 100 mL IVPB  Status:  Discontinued     500 mg 200 mL/hr over 30 Minutes Intravenous Every 6 hours 09/03/13 1007 09/06/13 1310   09/02/13 1300  vancomycin (VANCOCIN) IVPB 1000 mg/200 mL premix  Status:  Discontinued     1,000 mg 200 mL/hr over 60 Minutes Intravenous Every 12 hours 09/01/13 1527 09/07/13 1340   08/30/13 2000  vancomycin (VANCOCIN) IVPB 750 mg/150 ml premix  Status:  Discontinued     750 mg 150 mL/hr over 60 Minutes Intravenous Every 8 hours 08/30/13 1356 09/01/13 1527   08/29/13 2200  micafungin (MYCAMINE) 100 mg in sodium chloride 0.9 % 100 mL IVPB  Status:  Discontinued     100 mg 100 mL/hr over 1 Hours Intravenous Every 24 hours 08/29/13 2036 09/10/13 1432   08/29/13 0400  vancomycin (VANCOCIN) IVPB 1000 mg/200 mL premix  Status:  Discontinued     1,000 mg 200 mL/hr over 60 Minutes Intravenous Every 8 hours 08/28/13 2358 08/30/13 1356   08/29/13 0400  piperacillin-tazobactam (ZOSYN) IVPB 3.375 g  Status:  Discontinued     3.375 g 12.5 mL/hr over 240 Minutes Intravenous 3 times per day 08/28/13 2358 09/03/13 1002   08/28/13 1845  [MAR Hold]  vancomycin (VANCOCIN) IVPB 1000 mg/200 mL  premix     (On MAR Hold since 08/28/13 1957)   1,000 mg 200 mL/hr over 60 Minutes Intravenous  Once 08/28/13 1831 08/28/13 1955   08/28/13 1845  [MAR Hold]  piperacillin-tazobactam (ZOSYN) IVPB 3.375 g     (On MAR Hold since 08/28/13 1957)   3.375 g 100 mL/hr over 30 Minutes Intravenous  Once 08/28/13 1831 08/28/13 2056       Assessment/Plan Perforated gastric ulcer s/p exploratory laparotomy Dr. Andrey Campanile 08/28/13  POD#42 -s/p IR drain placement to RUQ x2. 2 additional surgical drains placed at time of surgery  -repeat CT still with extravasation, pelvic fluid collection resolved, fluid collection to dome of liver-->additional drain placed on 2/13  -Micafungin 2/6--->STOP AFTER 2 WEEKS OF THERAPY. Indication: UGI fistula, multiple intra-abdominal fluid collections. May need to continue treat a little longer due to new culture with yeast for second drain, but may not. Will d/w MD  -will need a repeat CT on Friday  -midline wound has dehisced, continue with BID wet to dry dressing changes, abdominal binder  -Continue NGT to suction, TPN, NPO  -Repeat CT scan tomorrow, went ahead and ordered it -VTE prophylaxis; SCDs, lovenox  PCM  -continue with TPN  C. Diff colitis  -treatment completed. Continue with flagyl while on antibiotics to reduce recurrence   Acute respiratory failure-resolved  NSTEMI-ischemia demand, stable, OP cards follow up  Acute systolic heart failure-echo 08/29/13 with diffuse hypokinesis with EF 40-45%, poor study, recommend repeat. Will need OP cards f/u Septic shock 2/2 peritonitis-resolved  AKI-resolved  Acute encephalopathy-resolved  Depression/anxiety -psych consulted. ZOX:WRUEAV sertraline 50mg  PO daily when tolerating orals.  Anemia of critical illness-stable  Hypokalemia-resolved  Hx HTN-stable. Continue with metoprolol IV. Tachycardia improved.      LOS: 42 days   Maxine Glenn, PA-S [facility] 10/09/2013, 7:54 AM [facility] Phone#:[phone]  ----------------------------------------------------------------------------------------------------------- General Surgery PA Preceptor Note:  I agree with the above PA students findings as above.  Made changes above as needed.  Aris Georgia, PA-C General Surgery [facility] Pager: [phone] Office: [phone]

## 2013-10-09 NOTE — Progress Notes (Signed)
Discussed with PA  Darran Gabay M. Anisia Leija, MD, FACS General, Bariatric, & Minimally Invasive Surgery [facility]  

## 2013-10-09 NOTE — Progress Notes (Signed)
Patient ID: James LeydenChristopher D Evinger, male   DOB: 05/11/1966, 48 y.o.   MRN: [id number]         [facility]    Date of Admission:  08/28/2013    Total days of antibiotics 42        Day 31 IV metronidazole        Day 14 micafungin Principal Problem:   Intra-abdominal abscess Active Problems:   Perforated gastric ulcer   ARDS (adult respiratory distress syndrome)   Hypocalcemia   Hypophosphatemia   Protein-calorie malnutrition, moderate   Atrial fibrillation   Pleural effusion   C. difficile colitis   Depression   Cigarette smoker   . sodium chloride   Intravenous Q24H  . antiseptic oral rinse  15 mL Mouth Rinse q12n4p  . chlorhexidine  15 mL Mouth Rinse BID  . enoxaparin (LOVENOX) injection  40 mg Subcutaneous Q24H  . insulin aspart  0-15 Units Subcutaneous 4 times per day  . metoprolol  7.5 mg Intravenous Q4H  . metronidazole  500 mg Intravenous Q6H  . micafungin (MYCAMINE) IV  100 mg Intravenous Daily  . pantoprazole (PROTONIX) IV  40 mg Intravenous Q12H  . sodium chloride  10-40 mL Intracatheter Q12H    Subjective: He is feeling better. He's not had any recent diarrhea.  Past Medical History  Diagnosis Date  . GERD (gastroesophageal reflux disease)   . Hypertension   . Depression   . Pleural effusion   . Dysrhythmia     ATRIAL FIBRILATION 08/2013    History  Substance Use Topics  . Smoking status: Current Every Day Smoker -- 1.00 packs/day    Types: Cigarettes  . Smokeless tobacco: Not on file  . Alcohol Use: No    Family History  Problem Relation Age of Onset  . Mental illness Mother   . Hypertension Sister   . Hypertension Brother     No Known Allergies  Objective: Temp:  [98.1 F (36.7 C)-99.3 F (37.4 C)] 98.7 F (37.1 C) (02/19 0517) Pulse Rate:  [106-116] 112 (02/19 0517) Resp:  [17-18] 17 (02/19 0517) BP: (122-141)/(90-99) 122/90 mmHg (02/19 0517) SpO2:  [97 %-99 %] 97 % (02/19 0517)  General: He is in no distress  watching TV Abdomen: Soft nontender. 55 cc out of drains yesterday. Drain outputs remain purulent  Lab Results Lab Results  Component Value Date   WBC 9.3 10/06/2013   HGB 10.6* 10/06/2013   HCT 31.3* 10/06/2013   MCV 88.7 10/06/2013   PLT 219 10/06/2013    Lab Results  Component Value Date   CREATININE 0.67 10/09/2013   BUN 30* 10/09/2013   NA 135* 10/09/2013   K 4.5 10/09/2013   CL 101 10/09/2013   CO2 24 10/09/2013    Lab Results  Component Value Date   ALT 13 10/09/2013   AST 15 10/09/2013   ALKPHOS 89 10/09/2013   BILITOT 0.3 10/09/2013    C difficile PCR 10/04/2013: Negative  Microbiology: Recent Results (from the past 240 hour(s))  BODY FLUID CULTURE     Status: None   Collection Time    10/02/13  1:53 PM      Result Value Ref Range Status   Specimen Description FLUID   Final   Special Requests     Final   Value: Normal INTRAABDOMINAL FLUID COLLECTION OVER THE LIVE DOME   Gram Stain     Final   Value: WBC PRESENT,BOTH PMN AND MONONUCLEAR  NO ORGANISMS SEEN     Performed at [facility]   Culture     Final   Value: YEAST     Note: CRITICAL RESULT CALLED TO, READ BACK BY AND VERIFIED WITH: LINDSEY B@1100  ON [id number] BY [facility]     Performed at [facility]   Report Status PENDING   Incomplete  CLOSTRIDIUM DIFFICILE BY PCR     Status: None   Collection Time    10/04/13 10:11 AM      Result Value Ref Range Status   C difficile by pcr NEGATIVE  NEGATIVE Final   Assessment: He is improving slowly on therapy for her polymicrobial intra-abdominal abscesses and C. difficile colitis. Peritoneal fluid cultures from February 12 are again growing yeast. I will continue micafungin. I will also continue IV metronidazole to cover help prevent early relapse of C. difficile colitis.  Plan: 1. Continue metronidazole and micafungin  2. I will followup next week  Cliffton Asters, MD [facility] [phone] pager    [phone] cell 10/09/2013, 11:49 AM

## 2013-10-09 NOTE — Progress Notes (Signed)
PARENTERAL NUTRITION CONSULT NOTE - FOLLOW UP  Pharmacy Consult for TPN Indication: Prolonged ileus  No Known Allergies  Patient Measurements: Height: 5\' 11"  (180.3 cm) Weight: 196 lb 3.4 oz (89 kg) IBW/kg (Calculated) : 75.3    Vital Signs: Temp: 98.7 F (37.1 C) (02/19 0517) Temp src: Oral (02/19 0517) BP: 122/90 mmHg (02/19 0517) Pulse Rate: 112 (02/19 0517) Intake/Output from previous day: 02/18 0701 - 02/19 0700 In: 3306 [I.V.:567; TPN:2739] Out: 3450 [Urine:2675; Emesis/NG output:720; Drains:55] Intake/Output from this shift:    Labs: No results found for this basename: WBC, HGB, HCT, PLT, APTT, INR,  in the last 72 hours   Recent Labs  10/09/13 0555  NA 135*  K 4.5  CL 101  CO2 24  GLUCOSE 111*  BUN 30*  CREATININE 0.67  CALCIUM 8.5  MG 1.8  PHOS 4.5  PROT 6.7  ALBUMIN 2.3*  AST 15  ALT 13  ALKPHOS 89  BILITOT 0.3   Estimated Creatinine Clearance: 119 ml/min (by C-G formula based on Cr of 0.67).    Recent Labs  10/08/13 1951 10/08/13 2329 10/09/13 0557  GLUCAP 141* 137* 105*    Insulin Requirements in the past 24 hours:  40 units insulin in TPN + 4 units mod SSI on TPN  Current Nutrition:  Clinimix E 5/15 2400 mL over 12 hours + lipid emulsion 20% at 240 mL over 12 hr- provides 120 gm protein and 2352 kcal which is 92% protein goals and 100% kcal goals  NPO   Nutritional Goals:  2250-2450 kcal and 130-140 gm protein per RD 2/18.   Assessment:  48 y.o. male presented to ED 1/8 with abd pain. CT consistent with perforated stomach ulcer due to NSAID use, pneumoperitoneum, peritonitis. Taken emergently to OR where he underwent exp lap, debridement of necrotic stomach, repair of perforated stomach. Contrast study showed no leak from stomach or small bowel.   GI: CT scan 1/31 poss persistent gastric perf, fluid collection 4.5x4.9, and 3rd collection between bladder and rectum. 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 Will require  NPO/NG/TNA for a long time- TPN has been transitioned to cyclic over 12 hours; Abdominal drains X 2 with clear/milky output.  NGT with some old bloody output that started this am  Considering GJT for benefit of enteral feeding vs tpn   Nutrition: RD recs to increase TPN slightly as pt continues to lose weight.  Prealbumin 23.3  Endo: No h/o DM. CBGs  130 off TPN & 141 and 137 on TPN. 40 units of insulin in bag.   Lytes:  K 4.5, Na 135, mag 1.8, phos 4.5  Heptobili: LFTs wnl, albumin  low at 2.3, TG 187- down a bit,  Palb 23.3  Renal:  Cr < 1, UOP 1.35ml/kg/hr  ID:  WBC wnl, afebrile; per ID note, cont Micafungin for total 14 days, to remain on IV Flagyl while on abx to prevent recurrence of CDiff.  CT guided asp'n of perihepatic fluid 2/13- cultures with yeast.  Repeat CDiff (-) on 2/14.  Cefepime stopped 2/16  Flagyl 1/21 >>  Micafungin 1/9>>1/21; 2/6 >> Primaxin 1/14>>1/23  Vanc PO 1/21>>1/31  Fluconazole 1/31>>2/6  Cefepime 2/10 >> 2/16 Vanc 1/8>>1/21 Zosyn 1/8>>1/14; 2/14 >> 2/10  2/12 intra-abd fluid: (+)yeast 2/6 peritoneal abscess: NF  2/1 peritoneal cavity: few candida glabrata  1/30 urine: NF  1/21 CDiff Positive  1/16 Trach aspirate: NF  1/9 Blood cultures: NF   TPN Access: triple lumen PICC 1/20; replaced 1/27  TPN day#: 40  Plan:  1. Increase volume of cyclic TPN to 2700 mls over 12 hours.  Clinimix E 5/15 + full trace elements and multivitamins + 40 units of regular insulin at cyclic rate- 50 ml/hr x 1 hr, then run at 260 ml/hr x 10 hrs, then run at 50 ml/hr x1 hr for 12 hours total.  20% Lipids to run at 8520mL/hr for 12 hours.  This new rate provides 135 gm protein and 2397 Kcals = 100% of goal.  2. Continue 1/2NS at 10-420mL/hr ONLY when TPN is not running (0600-1800)  3. CBG checks on and off TPN  4 . Follow up culture results and plans for feeding tube Herby AbrahamMichelle T. Quina Wilbourne, Pharm.D. [phone] 10/09/2013 8:28 AM

## 2013-10-10 ENCOUNTER — Inpatient Hospital Stay (HOSPITAL_COMMUNITY): Payer: Medicaid Other

## 2013-10-10 LAB — GLUCOSE, CAPILLARY
GLUCOSE-CAPILLARY: 117 mg/dL — AB (ref 70–99)
Glucose-Capillary: 108 mg/dL — ABNORMAL HIGH (ref 70–99)
Glucose-Capillary: 157 mg/dL — ABNORMAL HIGH (ref 70–99)

## 2013-10-10 LAB — BODY FLUID CULTURE: Special Requests: NORMAL

## 2013-10-10 MED ORDER — FAT EMULSION 20 % IV EMUL
240.0000 mL | INTRAVENOUS | Status: AC
  Administered 2013-10-10: 240 mL via INTRAVENOUS
  Filled 2013-10-10 (×2): qty 250

## 2013-10-10 MED ORDER — IOHEXOL 300 MG/ML  SOLN
100.0000 mL | Freq: Once | INTRAMUSCULAR | Status: AC | PRN
  Administered 2013-10-10: 100 mL via INTRAVENOUS

## 2013-10-10 MED ORDER — IOHEXOL 300 MG/ML  SOLN
25.0000 mL | INTRAMUSCULAR | Status: AC
  Administered 2013-10-10 (×2): 25 mL via ORAL

## 2013-10-10 MED ORDER — TRACE MINERALS CR-CU-F-FE-I-MN-MO-SE-ZN IV SOLN
INTRAVENOUS | Status: AC
  Administered 2013-10-10: 18:00:00 via INTRAVENOUS
  Filled 2013-10-10: qty 2700

## 2013-10-10 NOTE — Progress Notes (Signed)
43 Days Post-Op  Subjective: Pt states pain level is similar to yesterday (3 out of 10), but well managed. He states he walked twice yesterday, and has continued to pass gas and small amount of loose stool.  He is able to get incentive spirometry to approx 1350 with coaching at bedside.  Pt states dressing changes are still relatively painless, and he has no current complaints.  Objective: Vital signs in last 24 hours: Temp:  [98.2 F (36.8 C)-99.2 F (37.3 C)] 98.2 F (36.8 C) (02/20 0525) Pulse Rate:  [92-115] 115 (02/20 0525) Resp:  [16-18] 17 (02/20 0525) BP: (126-141)/(82-91) 137/82 mmHg (02/20 0525) SpO2:  [96 %-97 %] 96 % (02/20 0525) Weight:  [198 lb 10.2 oz (90.1 kg)] 198 lb 10.2 oz (90.1 kg) (02/20 0500) Last BM Date: 10/09/13  Intake/Output from previous day: 02/19 0701 - 02/20 0700 In: 3488.7 [I.V.:150.7; IV Piggyback:200; TPN:3138] Out: 2183 [Urine:1700; Emesis/NG output:430; Drains:53] Intake/Output this shift:    PE: Gen:  Alert, NAD, pleasant Abd: Soft, NT/ND, +BS, Wound covered by dressing and corset.  Drain#1: Purulent whitish-yellow discharge, similar to yesterday. Drain#2: Clear discharge RLQ: Light yellow, only small amount of drainage  Suprahepatic drain: Yellow, some tissue in JP bulb.   Lab Results:  No results found for this basename: WBC, HGB, HCT, PLT,  in the last 72 hours BMET  Recent Labs  10/09/13 0555  NA 135*  K 4.5  CL 101  CO2 24  GLUCOSE 111*  BUN 30*  CREATININE 0.67  CALCIUM 8.5   PT/INR No results found for this basename: LABPROT, INR,  in the last 72 hours CMP     Component Value Date/Time   NA 135* 10/09/2013 0555   K 4.5 10/09/2013 0555   CL 101 10/09/2013 0555   CO2 24 10/09/2013 0555   GLUCOSE 111* 10/09/2013 0555   BUN 30* 10/09/2013 0555   CREATININE 0.67 10/09/2013 0555   CALCIUM 8.5 10/09/2013 0555   PROT 6.7 10/09/2013 0555   ALBUMIN 2.3* 10/09/2013 0555   AST 15 10/09/2013 0555   ALT 13 10/09/2013 0555   ALKPHOS 89  10/09/2013 0555   BILITOT 0.3 10/09/2013 0555   GFRNONAA >90 10/09/2013 0555   GFRAA >90 10/09/2013 0555   Lipase     Component Value Date/Time   LIPASE 14 08/29/2013 0930       Studies/Results: No results found.  Anti-infectives: Anti-infectives   Start     Dose/Rate Route Frequency Ordered Stop   09/30/13 1500  ceFEPIme (MAXIPIME) 2 g in dextrose 5 % 50 mL IVPB  Status:  Discontinued     2 g 100 mL/hr over 30 Minutes Intravenous Every 12 hours 09/30/13 1443 10/06/13 0822   09/26/13 1800  vancomycin (VANCOCIN) 50 mg/mL oral solution 250 mg  Status:  Discontinued     250 mg Oral 4 times per day 09/26/13 1510 09/26/13 1554   09/26/13 1600  metroNIDAZOLE (FLAGYL) IVPB 500 mg     500 mg 100 mL/hr over 60 Minutes Intravenous Every 6 hours 09/26/13 1556     09/26/13 1515  micafungin (MYCAMINE) 100 mg in sodium chloride 0.9 % 100 mL IVPB     100 mg 100 mL/hr over 1 Hours Intravenous Daily 09/26/13 1510     09/24/13 0930  piperacillin-tazobactam (ZOSYN) IVPB 3.375 g  Status:  Discontinued     3.375 g 12.5 mL/hr over 240 Minutes Intravenous 3 times per day 09/24/13 0832 09/30/13 1443   09/22/13 1400  metroNIDAZOLE (  FLAGYL) IVPB 500 mg  Status:  Discontinued     500 mg 100 mL/hr over 60 Minutes Intravenous Every 8 hours 09/22/13 1119 09/26/13 1510   09/20/13 1230  fluconazole (DIFLUCAN) IVPB 200 mg  Status:  Discontinued     200 mg 100 mL/hr over 60 Minutes Intravenous Every 24 hours 09/20/13 1154 09/26/13 1510   09/20/13 1200  fluconazole (DIFLUCAN) IVPB 100 mg  Status:  Discontinued     100 mg 50 mL/hr over 60 Minutes Intravenous Every 24 hours 09/20/13 1151 09/20/13 1154   09/10/13 2200  metroNIDAZOLE (FLAGYL) IVPB 500 mg  Status:  Discontinued     500 mg 100 mL/hr over 60 Minutes Intravenous 3 times per day 09/10/13 1802 09/22/13 1118   09/10/13 2000  vancomycin (VANCOCIN) 50 mg/mL oral solution 500 mg  Status:  Discontinued     500 mg Oral 4 times per day 09/10/13 1802 09/21/13  1151   09/10/13 1800  vancomycin (VANCOCIN) 50 mg/mL oral solution 500 mg  Status:  Discontinued     500 mg Oral 4 times per day 09/10/13 1432 09/10/13 1802   09/10/13 1600  metroNIDAZOLE (FLAGYL) IVPB 500 mg  Status:  Discontinued     500 mg 100 mL/hr over 60 Minutes Intravenous 3 times per day 09/10/13 1432 09/10/13 1802   09/09/13 1602  vancomycin (VANCOCIN) 1,250 mg in sodium chloride 0.9 % 250 mL IVPB  Status:  Discontinued     1,250 mg 166.7 mL/hr over 90 Minutes Intravenous Every 24 hours 09/09/13 1057 09/10/13 1432   09/06/13 1600  imipenem-cilastatin (PRIMAXIN) 500 mg in sodium chloride 0.9 % 100 mL IVPB  Status:  Discontinued     500 mg 200 mL/hr over 30 Minutes Intravenous 3 times per day 09/06/13 1310 09/12/13 1048   09/03/13 1100  imipenem-cilastatin (PRIMAXIN) 500 mg in sodium chloride 0.9 % 100 mL IVPB  Status:  Discontinued     500 mg 200 mL/hr over 30 Minutes Intravenous Every 6 hours 09/03/13 1007 09/06/13 1310   09/02/13 1300  vancomycin (VANCOCIN) IVPB 1000 mg/200 mL premix  Status:  Discontinued     1,000 mg 200 mL/hr over 60 Minutes Intravenous Every 12 hours 09/01/13 1527 09/07/13 1340   08/30/13 2000  vancomycin (VANCOCIN) IVPB 750 mg/150 ml premix  Status:  Discontinued     750 mg 150 mL/hr over 60 Minutes Intravenous Every 8 hours 08/30/13 1356 09/01/13 1527   08/29/13 2200  micafungin (MYCAMINE) 100 mg in sodium chloride 0.9 % 100 mL IVPB  Status:  Discontinued     100 mg 100 mL/hr over 1 Hours Intravenous Every 24 hours 08/29/13 2036 09/10/13 1432   08/29/13 0400  vancomycin (VANCOCIN) IVPB 1000 mg/200 mL premix  Status:  Discontinued     1,000 mg 200 mL/hr over 60 Minutes Intravenous Every 8 hours 08/28/13 2358 08/30/13 1356   08/29/13 0400  piperacillin-tazobactam (ZOSYN) IVPB 3.375 g  Status:  Discontinued     3.375 g 12.5 mL/hr over 240 Minutes Intravenous 3 times per day 08/28/13 2358 09/03/13 1002   08/28/13 1845  [MAR Hold]  vancomycin (VANCOCIN) IVPB  1000 mg/200 mL premix     (On MAR Hold since 08/28/13 1957)   1,000 mg 200 mL/hr over 60 Minutes Intravenous  Once 08/28/13 1831 08/28/13 1955   08/28/13 1845  [MAR Hold]  piperacillin-tazobactam (ZOSYN) IVPB 3.375 g     (On MAR Hold since 08/28/13 1957)   3.375 g 100 mL/hr over 30 Minutes  Intravenous  Once 08/28/13 1831 08/28/13 2056       Day 32 IV metronidazole Day 15 micafungin  Assessment/Plan Perforated gastric ulcer s/p exploratory laparotomy Dr. Andrey CampanileWilson 08/28/13  POD#43  -s/p IR drain placement to RUQ x2. 2 additional surgical drains placed at time of surgery  -repeat CT still with extravasation, pelvic fluid collection resolved, fluid collection to dome of liver-->additional drain placed on 2/13  -Micafungin 2/6---> Continue per ID reference positive yeast cultures from 10/02/13. Indication: UGI fistula, multiple intra-abdominal fluid collections.   -CT scheduled for 0900 today -midline wound has dehisced, continue with BID wet to dry dressing changes, abdominal binder  -Continue NGT to suction, TPN, NPO  -VTE prophylaxis; SCDs, lovenox  -Discuss Drain labelling with Nurse.  EMR shows 3 Right JP Drains and 1 Left. Pt has 2 Right and 2 Left JP drains. PCM  -continue with TPN  C. Diff colitis  -treatment completed. Continue with flagyl while on antibiotics to reduce recurrence   Acute respiratory failure-resolved  NSTEMI-ischemia demand, stable, OP cards follow up  Acute systolic heart failure-echo 08/29/13 with diffuse hypokinesis with EF 40-45%, poor study, recommend repeat. Will need OP cards f/u  Septic shock 2/2 peritonitis-resolved  AKI-resolved  Acute encephalopathy-resolved  Depression/anxiety -psych consulted. ZOX:WRUEAVrec:resume sertraline 50mg  PO daily when tolerating orals.  Anemia of critical illness-stable  Hypokalemia-resolved  Hx HTN-stable. Continue with metoprolol IV. Tachycardia improved.     LOS: 43 days    Michael A. Dion Saucierillery, PA-S [employer] 10/10/2013,  7:58 AM [facility] Phone #: [phone]  ----------------------------------------------------------------------------------------------------------- General Surgery PA Preceptor Note:  I agree with the above PA students findings as above. CT scan pending.  Aris GeorgiaMegan Dort, PA-C [facility] Pager: [phone] Office: [phone]

## 2013-10-10 NOTE — Progress Notes (Signed)
CT reviewed late this evening. It appears that right sided percutaneous drains placed by IR can probably be removed since minimal fluid persists around these drains. 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  We'll need to review CT with radiology. Patient may benefit from percutaneous drainage of superior splenic abscess if amenable to IR drainage  Continue bowel rest, NG tube decompression with ice chips for comfort, TPN Antifungal coverage for yeast for most recent percutaneous fluid collection culture results  Mary SellaEric M. Andrey CampanileWilson, MD, FACS General, Bariatric, & Minimally Invasive Surgery [facility] [place]

## 2013-10-10 NOTE — Progress Notes (Signed)
PT Cancellation Note  Patient Details Name: James LeydenChristopher D Pileggi MRN: [id number] DOB: 11/15/1965   Cancelled Treatment:    Reason Eval/Treat Not Completed: Patient declined, no reason specified. Pt reports nausea and diarrhea currently with any movement and denied any attempts at mobility even with briefs. Will attempt at later date.    [name], [name] [name] 10/10/2013, 12:25 PM Delaney MeigsMaija Tabor Godfrey Tritschler, PT [phone]

## 2013-10-10 NOTE — Progress Notes (Signed)
43 Days Post-Op  Subjective: Pt doing fairly well; no new c/o; currently getting bath  Objective: Vital signs in last 24 hours: Temp:  [98.2 F (36.8 C)-99.1 F (37.3 C)] 99.1 F (37.3 C) (02/20 1356) Pulse Rate:  [99-115] 99 (02/20 1356) Resp:  [17-18] 18 (02/20 1356) BP: (136-141)/(82-91) 136/87 mmHg (02/20 1356) SpO2:  [96 %-97 %] 97 % (02/20 1356) Weight:  [198 lb 10.2 oz (90.1 kg)] 198 lb 10.2 oz (90.1 kg) (02/20 0500) Last BM Date: 10/09/13  Intake/Output from previous day: 02/19 0701 - 02/20 0700 In: 3488.7 [I.V.:150.7; IV Piggyback:200; TPN:3138] Out: 2183 [Urine:1700; Emesis/NG output:430; Drains:53] Intake/Output this shift: Total I/O In: 360 [I.V.:160; IV Piggyback:200] Out: 967.5 [Urine:700; Emesis/NG output:250; Drains:17.5]  Rt abd drains intact, outputs minimal; insertion sites ok  Lab Results:  No results found for this basename: WBC, HGB, HCT, PLT,  in the last 72 hours BMET  Recent Labs  10/09/13 0555  NA 135*  K 4.5  CL 101  CO2 24  GLUCOSE 111*  BUN 30*  CREATININE 0.67  CALCIUM 8.5   PT/INR No results found for this basename: LABPROT, INR,  in the last 72 hours ABG No results found for this basename: PHART, PCO2, PO2, HCO3,  in the last 72 hours  Studies/Results: Ct Abdomen Pelvis W Contrast  10/10/2013   CLINICAL DATA:  48 year old male with history of perforated gastric ulcer status post surgical repair. Re-evaluate for persistent gastric leak or intra-abdominal abscess.  EXAM: CT ABDOMEN AND PELVIS WITH CONTRAST  TECHNIQUE: Multidetector CT imaging of the abdomen and pelvis was performed using the standard protocol following bolus administration of intravenous contrast.  CONTRAST:  OMNIPAQUE IOHEXOL 300 MG/ML  SOLN  COMPARISON:  CT of the abdomen and pelvis 10/02/2013.  FINDINGS: Lung Bases: Central venous catheter tip terminating at the superior cavoatrial junction. Nasogastric tube with tip terminating at the gastroesophageal  junction. Minimal dependent subsegmental atelectasis in the lower lobes of the lungs bilaterally. Trace left pleural effusion.  Abdomen/Pelvis: Open midline abdominal wound healing via secondary intention. There is a focal area of gastric wall thinning along the proximal lesser curvature of the stomach, at this site of prior perforation and primary repair. Immediately cephalad to this there is a potential tract, and adjacent locule of gas (image 21 of series 2), which could be indicative of slight breakdown at the site of surgical repair. Several other adjacent locules of pneumoperitoneum are noted in the left upper quadrant. These findings are similar to the prior examination 10/02/2013, although the amount of pneumoperitoneum in this region appears slightly decreased, which may suggest that if there is breakdown of the surgical repair, that it has walled off in the interim. There is a 9.6 x 5.1 x 3.0 cm thick walled rim enhancing gas and fluid collection superior to the spleen, which appears increased in size compared to the prior study, concerning for an abscess. This continues to contain a small amount of high attenuation material which could represent blood products or residual oral contrast material from a prior examination, however, the amount of high attenuation material has decreased compared to prior studies. Several smaller well-defined low-attenuation fluid collections are again noted adjacent to the greater curvature of the stomach and in the region of the gastrosplenic ligament, largest of which is adjacent to the tail of the pancreas measuring 4.7 cm in diameter (image 33 of series 2), possibly small seromas and/or pancreatic pseudocysts.  Two surgical drains are noted in the epigastric region, predominantly  surrounded by omental fat, which demonstrates extensive fat stranding and nodularity. There are also 2 pigtail drainage catheters adjacent to the liver, 1 inferior to the gallbladder fossa, with  the other located posteriorly adjacent to segment 7 of the liver. Although there is a trace amount of perihepatic fluid and several tiny locules of gas adjacent to the right lobe of the liver, no well-defined abscess is noted in this region at this time.  There continue to be several collections of rim enhancing fluid in the lower abdomen and pelvis, however, these appear generally stable in volume compared to the prior study. No pathologic distention of small bowel. Numerous reactive sized lymph nodes are noted throughout the small bowel mesentery, omentum and retroperitoneum. Prostate gland and urinary bladder are unremarkable in appearance.  Musculoskeletal: There are no aggressive appearing lytic or blastic lesions noted in the visualized portions of the skeleton.  IMPRESSION: 1. Near complete resolution of perihepatic gas and fluid collection compared to the prior study, with 2 right-sided pigtail drainage catheters in place on today's examination. 2. Increasing rim enhancing gas and fluid collection in the left upper quadrant superior to the spleen, concerning for an enlarging abscess. 3. Several persistent locules of pneumoperitoneum in the upper abdomen, predominantly in the left upper quadrant. There is a suggestion of a fistulous tract arising from the proximal lesser curvature of the stomach at this site of the surgical repair, extending toward the left upper quadrant gas collections. This could suggest breakdown of these surgical repair, however, this overall appears slightly improved compared to the prior study, which may suggest that if there is a breakdown of the surgical repair, that it may have walled off in the interim. Continued attention on followup studies is recommended. 4. Stable volume of rim enhancing fluid collections in the lower abdomen and pelvis compared to the prior study. 5. Additional incidental findings, as above.   Electronically Signed   By: Trudie Reedaniel  Entrikin M.D.   On: 10/10/2013  11:45    Anti-infectives: Anti-infectives   Start     Dose/Rate Route Frequency Ordered Stop   09/30/13 1500  ceFEPIme (MAXIPIME) 2 g in dextrose 5 % 50 mL IVPB  Status:  Discontinued     2 g 100 mL/hr over 30 Minutes Intravenous Every 12 hours 09/30/13 1443 10/06/13 0822   09/26/13 1800  vancomycin (VANCOCIN) 50 mg/mL oral solution 250 mg  Status:  Discontinued     250 mg Oral 4 times per day 09/26/13 1510 09/26/13 1554   09/26/13 1600  metroNIDAZOLE (FLAGYL) IVPB 500 mg     500 mg 100 mL/hr over 60 Minutes Intravenous Every 6 hours 09/26/13 1556     09/26/13 1515  micafungin (MYCAMINE) 100 mg in sodium chloride 0.9 % 100 mL IVPB     100 mg 100 mL/hr over 1 Hours Intravenous Daily 09/26/13 1510     09/24/13 0930  piperacillin-tazobactam (ZOSYN) IVPB 3.375 g  Status:  Discontinued     3.375 g 12.5 mL/hr over 240 Minutes Intravenous 3 times per day 09/24/13 0832 09/30/13 1443   09/22/13 1400  metroNIDAZOLE (FLAGYL) IVPB 500 mg  Status:  Discontinued     500 mg 100 mL/hr over 60 Minutes Intravenous Every 8 hours 09/22/13 1119 09/26/13 1510   09/20/13 1230  fluconazole (DIFLUCAN) IVPB 200 mg  Status:  Discontinued     200 mg 100 mL/hr over 60 Minutes Intravenous Every 24 hours 09/20/13 1154 09/26/13 1510   09/20/13 1200  fluconazole (DIFLUCAN) IVPB  100 mg  Status:  Discontinued     100 mg 50 mL/hr over 60 Minutes Intravenous Every 24 hours 09/20/13 1151 09/20/13 1154   09/10/13 2200  metroNIDAZOLE (FLAGYL) IVPB 500 mg  Status:  Discontinued     500 mg 100 mL/hr over 60 Minutes Intravenous 3 times per day 09/10/13 1802 09/22/13 1118   09/10/13 2000  vancomycin (VANCOCIN) 50 mg/mL oral solution 500 mg  Status:  Discontinued     500 mg Oral 4 times per day 09/10/13 1802 09/21/13 1151   09/10/13 1800  vancomycin (VANCOCIN) 50 mg/mL oral solution 500 mg  Status:  Discontinued     500 mg Oral 4 times per day 09/10/13 1432 09/10/13 1802   09/10/13 1600  metroNIDAZOLE (FLAGYL) IVPB 500 mg   Status:  Discontinued     500 mg 100 mL/hr over 60 Minutes Intravenous 3 times per day 09/10/13 1432 09/10/13 1802   09/09/13 1602  vancomycin (VANCOCIN) 1,250 mg in sodium chloride 0.9 % 250 mL IVPB  Status:  Discontinued     1,250 mg 166.7 mL/hr over 90 Minutes Intravenous Every 24 hours 09/09/13 1057 09/10/13 1432   09/06/13 1600  imipenem-cilastatin (PRIMAXIN) 500 mg in sodium chloride 0.9 % 100 mL IVPB  Status:  Discontinued     500 mg 200 mL/hr over 30 Minutes Intravenous 3 times per day 09/06/13 1310 09/12/13 1048   09/03/13 1100  imipenem-cilastatin (PRIMAXIN) 500 mg in sodium chloride 0.9 % 100 mL IVPB  Status:  Discontinued     500 mg 200 mL/hr over 30 Minutes Intravenous Every 6 hours 09/03/13 1007 09/06/13 1310   09/02/13 1300  vancomycin (VANCOCIN) IVPB 1000 mg/200 mL premix  Status:  Discontinued     1,000 mg 200 mL/hr over 60 Minutes Intravenous Every 12 hours 09/01/13 1527 09/07/13 1340   08/30/13 2000  vancomycin (VANCOCIN) IVPB 750 mg/150 ml premix  Status:  Discontinued     750 mg 150 mL/hr over 60 Minutes Intravenous Every 8 hours 08/30/13 1356 09/01/13 1527   08/29/13 2200  micafungin (MYCAMINE) 100 mg in sodium chloride 0.9 % 100 mL IVPB  Status:  Discontinued     100 mg 100 mL/hr over 1 Hours Intravenous Every 24 hours 08/29/13 2036 09/10/13 1432   08/29/13 0400  vancomycin (VANCOCIN) IVPB 1000 mg/200 mL premix  Status:  Discontinued     1,000 mg 200 mL/hr over 60 Minutes Intravenous Every 8 hours 08/28/13 2358 08/30/13 1356   08/29/13 0400  piperacillin-tazobactam (ZOSYN) IVPB 3.375 g  Status:  Discontinued     3.375 g 12.5 mL/hr over 240 Minutes Intravenous 3 times per day 08/28/13 2358 09/03/13 1002   08/28/13 1845  [MAR Hold]  vancomycin (VANCOCIN) IVPB 1000 mg/200 mL premix     (On MAR Hold since 08/28/13 1957)   1,000 mg 200 mL/hr over 60 Minutes Intravenous  Once 08/28/13 1831 08/28/13 1955   08/28/13 1845  [MAR Hold]  piperacillin-tazobactam (ZOSYN) IVPB  3.375 g     (On MAR Hold since 08/28/13 1957)   3.375 g 100 mL/hr over 30 Minutes Intravenous  Once 08/28/13 1831 08/28/13 2056      Assessment/Plan: s/p repair of perf gastric ulcer; rt lat abd abscess drainage 2/6, rt perihepatic abscess drainage 2/12; f/u CT today reviewed by Dr. Bonnielee Haff; near resolution of IR drained collections- can remove rt sided drains at discretion of CCS- await plans of Dr. Andrey Campanile   LOS: 43 days    ALLRED,D [facility] 10/10/2013

## 2013-10-10 NOTE — Progress Notes (Addendum)
PARENTERAL NUTRITION CONSULT NOTE - FOLLOW UP  Pharmacy Consult for TPN Indication: Prolonged ileus  No Known Allergies  Patient Measurements: Height: 5\' 11"  (180.3 cm) Weight: 198 lb 10.2 oz (90.1 kg) IBW/kg (Calculated) : 75.3    Vital Signs: Temp: 98.2 F (36.8 C) (02/20 0525) Temp src: Oral (02/20 0525) BP: 137/82 mmHg (02/20 0525) Pulse Rate: 115 (02/20 0525) Intake/Output from previous day: 02/19 0701 - 02/20 0700 In: 3488.7 [I.V.:150.7; IV Piggyback:200; TPN:3138] Out: 2183 [Urine:1700; Emesis/NG output:430; Drains:53] Intake/Output from this shift:    Labs: No results found for this basename: WBC, HGB, HCT, PLT, APTT, INR,  in the last 72 hours   Recent Labs  10/09/13 0555  NA 135*  K 4.5  CL 101  CO2 24  GLUCOSE 111*  BUN 30*  CREATININE 0.67  CALCIUM 8.5  MG 1.8  PHOS 4.5  PROT 6.7  ALBUMIN 2.3*  AST 15  ALT 13  ALKPHOS 89  BILITOT 0.3   Estimated Creatinine Clearance: 119 ml/min (by C-G formula based on Cr of 0.67).    Recent Labs  10/09/13 1956 10/09/13 2355 10/10/13 0607  GLUCAP 170* 154* 117*    Insulin Requirements in the past 24 hours:  40 units insulin in TPN + 6 units mod SSI on TPN  Current Nutrition:  Clinimix E 5/15 2400 mL over 12 hours + lipid emulsion 20% at 240 mL over 12 hr- provides 120 gm protein and 2352 kcal which is 92% protein goals and 100% kcal goals  NPO   Nutritional Goals:  2250-2450 kcal and 130-140 gm protein per RD 2/18.   Assessment:  48 y.o. male presented to ED 1/8 with abd pain. CT consistent with perforated stomach ulcer due to NSAID use, pneumoperitoneum, peritonitis. Taken emergently to OR where he underwent exp lap, debridement of necrotic stomach, repair of perforated stomach. Contrast study showed no leak from stomach or small bowel.   GI: CT scan 1/31 poss persistent gastric perf, fluid collection 4.5x4.9, and 3rd collection between bladder and rectum. 2/1: large stomach wall defect and  fistula. Will require NPO/NG/TNA for a long time- TPN has been transitioned to cyclic over 12 hours; Abdominal drains X 2.  NGT low suction  Considering GJT for benefit of enteral feeding vs tpn   Nutrition: RD recs to increase TPN slightly as pt continues to lose weight.  Prealbumin 23.3  Endo: No h/o DM. CBGs  114 off TPN & 170,154 on TPN. 40 units of insulin in bag.   Lytes:  No lytes today  Heptobili: LFTs wnl, albumin  low at 2.3, TG 187- down a bit,  Palb 23.3  Renal:  Cr < 1, UOP 0.8 ml/kg/hr  ID:  WBC wnl, afebrile; per ID note, cont Micafungin as yeast in culture 2/12, to remain on IV Flagyl while on abx to prevent recurrence of CDiff.  CT guided asp'n of perihepatic fluid 2/13- cultures with yeast.  Repeat CDiff (-) on 2/14.  Cefepime stopped 2/16  Flagyl 1/21 >>  Micafungin 1/9>>1/21; 2/6 >> Primaxin 1/14>>1/23  Vanc PO 1/21>>1/31  Fluconazole 1/31>>2/6  Cefepime 2/10 >> 2/16 Vanc 1/8>>1/21 Zosyn 1/8>>1/14; 2/14 >> 2/10  2/12 intra-abd fluid: (+)yeast 2/6 peritoneal abscess: NF  2/1 peritoneal cavity: few candida glabrata  1/30 urine: NF  1/21 CDiff Positive  1/16 Trach aspirate: NF  1/9 Blood cultures: NF   TPN Access: triple lumen PICC 1/20; replaced 1/27  TPN day#: 41  Plan:  1. Increase volume of cyclic TPN to  2700 mls over 12 hours.  Clinimix E 5/15 + 40 units of regular insulin at cyclic rate- 50 ml/hr x 1 hr, then run at 260 ml/hr x 10 hrs, then run at 50 ml/hr x1 hr for 12 hours total.  20% Lipids to run at 3920mL/hr for 12 hours.  This new rate provides 135 gm protein and 2397 Kcals = 100% of goal.  2. Continue 1/2NS at 10-2020mL/hr ONLY when TPN is not running (0600-1800)  3. CBG checks on and off TPN  4 . Follow up plans for feeding tube 5.  Trace elements on MWF only due to Pulte Homesshortage  Kadie Balestrieri, Pharm.D. [phone] 10/10/2013 8:21 AM

## 2013-10-10 NOTE — Progress Notes (Signed)
Occupational Therapy Treatment Patient Details Name: James LeydenChristopher D Mccormick MRN: [id number] DOB: 07/11/1966 Today's Date: 10/10/2013 Time: [id number] OT Time Calculation (min): 17 min  OT Assessment / Plan / Recommendation  History of present illness 48 yo male smoker presented with abdominal pain from perforated gastric ulcer in setting of NSAID.  PCCM consulted to assist with septic shock and respiratory failure management.  Reintubated  1/24 and reextubated 1/26.   OT comments  Pt slowly progressing.  Limited by fatigue today.  Focus of session on bil UE HEP and beginning education on energy conservation.  Follow Up Recommendations  Home health OT;Supervision/Assistance - 24 hour    Barriers to Discharge       Equipment Recommendations  3 in 1 bedside comode    Recommendations for Other Services    Frequency Min 2X/week   Progress towards OT Goals Progress towards OT goals: Progressing toward goals  Plan Discharge plan remains appropriate    Precautions / Restrictions Precautions Precautions: Fall Precaution Comments: watch HR (tachy) Required Braces or Orthoses: Other Brace/Splint Other Brace/Splint: abdominal binder Restrictions Weight Bearing Restrictions: No Other Position/Activity Restrictions: NG tube   Pertinent Vitals/Pain See vitals    ADL  ADL Comments: Pt performed bil UE strenghtening HEP with theraband.  OT began education on energy conservation during ADLs.  Discussed importance of pacing himself during bathing/dressing and to perform majority of tasks seated until he has further increased strength/endurance.    OT Diagnosis:    OT Problem List:   OT Treatment Interventions:     OT Goals(current goals can now be found in the care plan section) Acute Rehab OT Goals Patient Stated Goal: not stated OT Goal Formulation: With patient Time For Goal Achievement: 10/24/13 Potential to Achieve Goals: Good ADL Goals Pt Will Perform Grooming: with modified  independence;standing Pt Will Perform Upper Body Bathing: with modified independence;sitting Pt Will Perform Lower Body Bathing: with modified independence;sit to/from stand Pt Will Transfer to Toilet: with modified independence;ambulating;regular height toilet Pt Will Perform Toileting - Clothing Manipulation and hygiene: with modified independence;sit to/from stand Additional ADL Goal #1: Pt will independently verbalize and demonstrate 3/3 energy conservation techniques.   Additional ADL Goal #2: Pt will independently perform HEP in UE's to increase strength.  Visit Information  Last OT Received On: 10/10/13 Assistance Needed: +1 History of Present Illness: 48 yo male smoker presented with abdominal pain from perforated gastric ulcer in setting of NSAID.  PCCM consulted to assist with septic shock and respiratory failure management.  Reintubated  1/24 and reextubated 1/26.    Subjective Data      Prior Functioning       Cognition  Cognition Arousal/Alertness: Awake/alert Behavior During Therapy: WFL for tasks assessed/performed Overall Cognitive Status: Within Functional Limits for tasks assessed    Mobility       Exercises  General Exercises - Upper Extremity Shoulder Flexion: AROM;Both;15 reps;Supine;Theraband Theraband Level (Shoulder Flexion): Level 2 (Red) Shoulder ABduction: AROM;Both;15 reps;Supine;Theraband Theraband Level (Shoulder Abduction): Level 2 (Red) Shoulder Horizontal ABduction: AROM;Both;15 reps;Supine;Theraband Theraband Level (Shoulder Horizontal Abduction): Level 2 (Red) Elbow Flexion: AROM;Both;15 reps;Supine Elbow Extension: AROM;Both;15 reps;Supine;Theraband Theraband Level (Elbow Extension): Level 2 (Red)   Balance    End of Session OT - End of Session Activity Tolerance: Patient limited by fatigue Patient left: in bed;with call bell/phone within reach;with nursing/sitter in room  GO   10/10/2013 Cipriano MileJohnson, Chrysta Fulcher [name] OTR/L Pager [phone]  Office [phone]   Cipriano MileJohnson, Rehanna Oloughlin [name] 10/10/2013, 3:04 PM

## 2013-10-11 ENCOUNTER — Inpatient Hospital Stay (HOSPITAL_COMMUNITY): Payer: Medicaid Other

## 2013-10-11 LAB — BASIC METABOLIC PANEL
BUN: 28 mg/dL — AB (ref 6–23)
CHLORIDE: 100 meq/L (ref 96–112)
CO2: 25 mEq/L (ref 19–32)
CREATININE: 0.7 mg/dL (ref 0.50–1.35)
Calcium: 8.7 mg/dL (ref 8.4–10.5)
GFR calc non Af Amer: >90 mL/min (ref >90)
Glucose, Bld: 93 mg/dL (ref 70–99)
Potassium: 4.8 mEq/L (ref 3.7–5.3)
Sodium: 135 mEq/L — ABNORMAL LOW (ref 137–147)

## 2013-10-11 LAB — GLUCOSE, CAPILLARY
Glucose-Capillary: 158 mg/dL — ABNORMAL HIGH (ref 70–99)
Glucose-Capillary: 180 mg/dL — ABNORMAL HIGH (ref 70–99)
Glucose-Capillary: 98 mg/dL (ref 70–99)

## 2013-10-11 LAB — CBC
HCT: 35.3 % — ABNORMAL LOW (ref 39.0–52.0)
Hemoglobin: 11.5 g/dL — ABNORMAL LOW (ref 13.0–17.0)
MCH: 29.3 pg (ref 26.0–34.0)
MCHC: 32.6 g/dL (ref 30.0–36.0)
MCV: 90.1 fL (ref 78.0–100.0)
Platelets: 249 10*3/uL (ref 150–400)
RBC: 3.92 MIL/uL — ABNORMAL LOW (ref 4.22–5.81)
RDW: 15.2 % (ref 11.5–15.5)
WBC: 12.5 10*3/uL — ABNORMAL HIGH (ref 4.0–10.5)

## 2013-10-11 MED ORDER — FENTANYL CITRATE 0.05 MG/ML IJ SOLN
INTRAMUSCULAR | Status: AC | PRN
  Administered 2013-10-11: 100 ug via INTRAVENOUS
  Administered 2013-10-11: 50 ug via INTRAVENOUS

## 2013-10-11 MED ORDER — MIDAZOLAM HCL 2 MG/2ML IJ SOLN
INTRAMUSCULAR | Status: AC | PRN
  Administered 2013-10-11: 2 mg via INTRAVENOUS
  Administered 2013-10-11: 1 mg via INTRAVENOUS

## 2013-10-11 MED ORDER — MIDAZOLAM HCL 2 MG/2ML IJ SOLN
INTRAMUSCULAR | Status: AC
  Filled 2013-10-11: qty 4

## 2013-10-11 MED ORDER — LIDOCAINE HCL 1 % IJ SOLN
INTRAMUSCULAR | Status: AC
  Filled 2013-10-11: qty 10

## 2013-10-11 MED ORDER — M.V.I. ADULT IV INJ
INTRAVENOUS | Status: AC
  Administered 2013-10-11: 17:00:00 via INTRAVENOUS
  Filled 2013-10-11: qty 2700

## 2013-10-11 MED ORDER — FAT EMULSION 20 % IV EMUL
240.0000 mL | INTRAVENOUS | Status: AC
  Administered 2013-10-11: 240 mL via INTRAVENOUS
  Filled 2013-10-11 (×2): qty 250

## 2013-10-11 MED ORDER — FENTANYL CITRATE 0.05 MG/ML IJ SOLN
INTRAMUSCULAR | Status: AC
  Filled 2013-10-11: qty 4

## 2013-10-11 NOTE — Progress Notes (Addendum)
44 Days Post-Op  Subjective: Stable and alert. Denies any new abdominal pain or chest pain or shoulder pain.  I have reviewed his CT scan. It appears he has a left subphrenic abscess and possibly a second perisplenic hilar fluid collection. Right-sided drain areas looked good and fluid collections are mostly resolved.  Remains on bowel rest, NG tube decompression, T&A, antifungal coverage.  Am labs pending.  Objective: Vital signs in last 24 hours: Temp:  [98.3 F (36.8 C)-99.5 F (37.5 C)] 98.3 F (36.8 C) (02/21 0605) Pulse Rate:  [99-119] 113 (02/21 0605) Resp:  [18-20] 20 (02/21 0605) BP: (113-136)/(74-90) 117/89 mmHg (02/21 0605) SpO2:  [96 %-97 %] 96 % (02/21 0605) Weight:  [193 lb (87.544 kg)] 193 lb (87.544 kg) (02/21 0500) Last BM Date: 10/10/13  Intake/Output from previous day: 02/20 0701 - 02/21 0700 In: 420 [I.V.:160; IV Piggyback:200; TPN:60] Out: 4481.5 [Urine:3600; Emesis/NG output:850; Drains:31.5] Intake/Output this shift: Total I/O In: 60 [TPN:60] Out: 3514 [Urine:2900; Emesis/NG output:600; Drains:14]    EXAM: General appearance: alert. Oriented. Mental status normal. In no distress. GI:   Soft. Nontender. Nondistended.Drain#1: Purulent whitish-yellow discharge, similar to yesterday. Drain#2: Clear discharge RLQ: Light yellow, only small amount of drainage Suprahepatic drain: Yellow, some tissue in JP bulb.   Lab Results:  Results for orders placed during the hospital encounter of 08/28/13 (from the past 24 hour(s))  GLUCOSE, CAPILLARY     Status: Abnormal   Collection Time    10/10/13  1:42 PM      Result Value Ref Range   Glucose-Capillary 108 (*) 70 - 99 mg/dL  GLUCOSE, CAPILLARY     Status: Abnormal   Collection Time    10/10/13  8:06 PM      Result Value Ref Range   Glucose-Capillary 157 (*) 70 - 99 mg/dL   Comment 1 Notify RN    GLUCOSE, CAPILLARY     Status: Abnormal   Collection Time    10/11/13 12:17 AM      Result Value Ref Range   Glucose-Capillary 158 (*) 70 - 99 mg/dL   Comment 1 Notify RN    GLUCOSE, CAPILLARY     Status: None   Collection Time    10/11/13  5:44 AM      Result Value Ref Range   Glucose-Capillary 98  70 - 99 mg/dL   Comment 1 Notify RN       Studies/Results: @RISRSLT24 @  . sodium chloride   Intravenous Q24H  . antiseptic oral rinse  15 mL Mouth Rinse q12n4p  . chlorhexidine  15 mL Mouth Rinse BID  . enoxaparin (LOVENOX) injection  40 mg Subcutaneous Q24H  . insulin aspart  0-15 Units Subcutaneous 4 times per day  . metoprolol  7.5 mg Intravenous Q4H  . metronidazole  500 mg Intravenous Q6H  . micafungin (MYCAMINE) IV  100 mg Intravenous Daily  . pantoprazole (PROTONIX) IV  40 mg Intravenous Q12H  . sodium chloride  10-40 mL Intracatheter Q12H    Day 33 IV flagyl Day 16 micafungin  Assessment/Plan: s/p Procedure(s): EXPLORATORY LAPAROTOMY,debridment of nacrotic stomach,and primary repair of perforated stomach.  Perforated gastric ulcer s/p exploratory laparotomy Dr. Andrey Campanile 08/28/13  POD#44  -s/p IR drain placement to RUQ x2. 2 additional surgical drains placed at time of surgery  -repeat CT still with extravasation, pelvic fluid collection resolved, fluid collection to dome of liver-->additional drain placed on 2/13  -Micafungin 2/6---> Continue per ID reference positive yeast cultures from 10/02/13. Indication: UGI fistula,  multiple intra-abdominal fluid collections.  Right-sided drains have successfully evacuated right-sided fluid collections and can probably be discontinued if I are agrees.  -midline wound has dehisced, continue with BID wet to dry dressing changes, abdominal binder  -Continue NGT to suction, TPN, NPO  -VTE prophylaxis; SCDs, lovenox  -Discuss Drain labelling with Nurse. EMR shows 3 Right JP Drains and 1 Left. Pt has 2 Right and 2 Left JP drains.   Increasing left subphrenic and left splenic hilar fluid collections. These will need to be drained. We'll consult IR  for a drainage today.  PCM  -continue with TPN  C. Diff colitis  -treatment completed. Continue with flagyl while on antibiotics to reduce recurrence  Acute respiratory failure-resolved  NSTEMI-ischemia demand, stable, OP cards follow up  Acute systolic heart failure-echo 08/29/13 with diffuse hypokinesis with EF 40-45%, poor study, recommend repeat. Will need OP cards f/u  Septic shock 2/2 peritonitis-resolved  AKI-resolved  Acute encephalopathy-resolved  Depression/anxiety -psych consulted. ZOX:WRUEAVrec:resume sertraline 50mg  PO daily when tolerating orals.  Anemia of critical illness-stable  Hypokalemia-resolved  Hx HTN-stable. Continue with metoprolol IV. Tachycardia improved.    @PROBHOSP @  LOS: 44 days    Finola Rosal M 10/11/2013  . .prob

## 2013-10-11 NOTE — Progress Notes (Signed)
PARENTERAL NUTRITION CONSULT NOTE - FOLLOW UP  Pharmacy Consult for TPN Indication: Prolonged ileus  No Known Allergies  Patient Measurements: Height: 5\' 11"  (180.3 cm) Weight: 193 lb (87.544 kg) IBW/kg (Calculated) : 75.3    Vital Signs: Temp: 98.3 F (36.8 C) (02/21 0605) Temp src: Oral (02/21 0605) BP: 117/89 mmHg (02/21 0605) Pulse Rate: 113 (02/21 0605) Intake/Output from previous day: 02/20 0701 - 02/21 0700 In: 2580 [I.V.:160; IV Piggyback:200; TPN:2220] Out: 4486.5 [Urine:3600; Emesis/NG output:850; Drains:36.5] Intake/Output from this shift: Total I/O In: 10 [I.V.:10] Out: -   Labs:  Recent Labs  10/11/13 0552  WBC 12.5*  HGB 11.5*  HCT 35.3*  PLT 249     Recent Labs  10/09/13 0555 10/11/13 0552  NA 135* 135*  K 4.5 4.8  CL 101 100  CO2 24 25  GLUCOSE 111* 93  BUN 30* 28*  CREATININE 0.67 0.70  CALCIUM 8.5 8.7  MG 1.8  --   PHOS 4.5  --   PROT 6.7  --   ALBUMIN 2.3*  --   AST 15  --   ALT 13  --   ALKPHOS 89  --   BILITOT 0.3  --    Estimated Creatinine Clearance: 119 ml/min (by C-G formula based on Cr of 0.7).    Recent Labs  10/10/13 2006 10/11/13 0017 10/11/13 0544  GLUCAP 157* 158* 98    Insulin Requirements in the past 24 hours:  40 units insulin in TPN + 6 units mod SSI   Current Nutrition:  Clinimix E 5/15 2400 mL over 12 hours + lipid emulsion 20% at 240 mL over 12 hr- provides 120 gm protein and 2352 kcal which is 92% protein goals and 100% kcal goals  NPO   Nutritional Goals:  2250-2450 kcal and 130-140 gm protein per RD 2/18.   Assessment:  48 y.o. male presented to ED 1/8 with abd pain. CT consistent with perforated stomach ulcer due to NSAID use, pneumoperitoneum, peritonitis. Taken emergently to OR where he underwent exp lap, debridement of necrotic stomach, repair of perforated stomach. Contrast study showed no leak from stomach or small bowel.   GI: CT scan 1/31 poss persistent gastric perf, fluid collection  4.5x4.9, and 3rd collection between bladder and rectum. 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 Will require NPO/NG/TNA for a long time- TPN has been transitioned to cyclic over 12 hours; Abdominal drains X 2.  NGT low suction  Considering GJT for benefit of enteral feeding vs tpn   Per CCS and IR,  right sided drains can be removed  Nutrition: RD recs to increase TPN slightly as pt continues to lose weight.  Prealbumin 23.3  Endo: No h/o DM. CBGs  98 off TPN ,158 on TPN. 40 units of insulin in bag.   Lytes:  Na 135, K 4.8  Heptobili: LFTs wnl, albumin  low at 2.3, TG 187- down a bit,  Palb 23.3  Renal:  Cr < 1, UOP  1.7 ml/kg/hr  ID:  WBC wnl, afebrile; per ID note, cont Micafungin as yeast in culture 2/12, to remain on IV Flagyl while on abx to prevent recurrence of CDiff.  CT guided asp'n of perihepatic fluid 2/13- cultures with yeast.  Repeat CDiff (-) on 2/14.  Cefepime stopped 2/16  Flagyl 1/21 >>  Micafungin 1/9>>1/21; 2/6 >> Primaxin 1/14>>1/23  Vanc PO 1/21>>1/31  Fluconazole 1/31>>2/6  Cefepime 2/10 >> 2/16 Vanc 1/8>>1/21 Zosyn 1/8>>1/14; 2/14 >> 2/10  2/12 intra-abd fluid: (+)yeast 2/6  peritoneal abscess: NF  2/1 peritoneal cavity: few candida glabrata  1/30 urine: NF  1/21 CDiff Positive  1/16 Trach aspirate: NF  1/9 Blood cultures: NF   TPN Access: triple lumen PICC 1/20; replaced 1/27  TPN day#: 42  Plan:  1. Cont cyclic TPN to 2700 mls over 12 hours.  Clinimix E 5/15 + 40 units of regular insulin at cyclic rate- 50 ml/hr x 1 hr, then run at 260 ml/hr x 10 hrs, then run at 50 ml/hr x1 hr for 12 hours total.  20% Lipids to run at 8420mL/hr for 12 hours.  This new rate provides 135 gm protein and 2397 Kcals = 100% of goal.  2. Continue 1/2NS at 10-2220mL/hr ONLY when TPN is not running (0600-1800)  3. CBG checks on and off TPN  4 . Follow up plans for feeding tube 5.  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 [phone] 10/11/2013 8:46 AM

## 2013-10-11 NOTE — Progress Notes (Signed)
44 Days Post-Op  Subjective: Pt without new c/o  Objective: Vital signs in last 24 hours: Temp:  [98.3 F (36.8 C)-99.5 F (37.5 C)] 98.3 F (36.8 C) (02/21 0605) Pulse Rate:  [99-119] 113 (02/21 0605) Resp:  [18-20] 20 (02/21 0605) BP: (113-136)/(74-90) 117/89 mmHg (02/21 0605) SpO2:  [96 %-97 %] 96 % (02/21 0605) Weight:  [193 lb (87.544 kg)] 193 lb (87.544 kg) (02/21 0500) Last BM Date: 10/10/13  Intake/Output from previous day: 02/20 0701 - 02/21 0700 In: 2580 [I.V.:160; IV Piggyback:200; TPN:2220] Out: 4486.5 [Urine:3600; Emesis/NG output:850; Drains:36.5] Intake/Output this shift: Total I/O In: 10 [I.V.:10] Out: -   Rt abd drains intact, outputs minimal, abd soft, minimal tenderness; chest- sl dim BS bases; heart- tachy, reg rhythm; ext- no edema  Lab Results:   Recent Labs  10/11/13 0552  WBC 12.5*  HGB 11.5*  HCT 35.3*  PLT 249   BMET  Recent Labs  10/09/13 0555 10/11/13 0552  NA 135* 135*  K 4.5 4.8  CL 101 100  CO2 24 25  GLUCOSE 111* 93  BUN 30* 28*  CREATININE 0.67 0.70  CALCIUM 8.5 8.7   PT/INR No results found for this basename: LABPROT, INR,  in the last 72 hours ABG No results found for this basename: PHART, PCO2, PO2, HCO3,  in the last 72 hours  Studies/Results: Ct Abdomen Pelvis W Contrast  10/10/2013   CLINICAL DATA:  48 year old male with history of perforated gastric ulcer status post surgical repair. Re-evaluate for persistent gastric leak or intra-abdominal abscess.  EXAM: CT ABDOMEN AND PELVIS WITH CONTRAST  TECHNIQUE: Multidetector CT imaging of the abdomen and pelvis was performed using the standard protocol following bolus administration of intravenous contrast.  CONTRAST:  OMNIPAQUE IOHEXOL 300 MG/ML  SOLN  COMPARISON:  CT of the abdomen and pelvis 10/02/2013.  FINDINGS: Lung Bases: Central venous catheter tip terminating at the superior cavoatrial junction. Nasogastric tube with tip terminating at the gastroesophageal  junction. Minimal dependent subsegmental atelectasis in the lower lobes of the lungs bilaterally. Trace left pleural effusion.  Abdomen/Pelvis: Open midline abdominal wound healing via secondary intention. There is a focal area of gastric wall thinning along the proximal lesser curvature of the stomach, at this site of prior perforation and primary repair. Immediately cephalad to this there is a potential tract, and adjacent locule of gas (image 21 of series 2), which could be indicative of slight breakdown at the site of surgical repair. Several other adjacent locules of pneumoperitoneum are noted in the left upper quadrant. These findings are similar to the prior examination 10/02/2013, although the amount of pneumoperitoneum in this region appears slightly decreased, which may suggest that if there is breakdown of the surgical repair, that it has walled off in the interim. There is a 9.6 x 5.1 x 3.0 cm thick walled rim enhancing gas and fluid collection superior to the spleen, which appears increased in size compared to the prior study, concerning for an abscess. This continues to contain a small amount of high attenuation material which could represent blood products or residual oral contrast material from a prior examination, however, the amount of high attenuation material has decreased compared to prior studies. Several smaller well-defined low-attenuation fluid collections are again noted adjacent to the greater curvature of the stomach and in the region of the gastrosplenic ligament, largest of which is adjacent to the tail of the pancreas measuring 4.7 cm in diameter (image 33 of series 2), possibly small seromas and/or pancreatic  pseudocysts.  Two surgical drains are noted in the epigastric region, predominantly surrounded by omental fat, which demonstrates extensive fat stranding and nodularity. There are also 2 pigtail drainage catheters adjacent to the liver, 1 inferior to the gallbladder fossa, with  the other located posteriorly adjacent to segment 7 of the liver. Although there is a trace amount of perihepatic fluid and several tiny locules of gas adjacent to the right lobe of the liver, no well-defined abscess is noted in this region at this time.  There continue to be several collections of rim enhancing fluid in the lower abdomen and pelvis, however, these appear generally stable in volume compared to the prior study. No pathologic distention of small bowel. Numerous reactive sized lymph nodes are noted throughout the small bowel mesentery, omentum and retroperitoneum. Prostate gland and urinary bladder are unremarkable in appearance.  Musculoskeletal: There are no aggressive appearing lytic or blastic lesions noted in the visualized portions of the skeleton.  IMPRESSION: 1. Near complete resolution of perihepatic gas and fluid collection compared to the prior study, with 2 right-sided pigtail drainage catheters in place on today's examination. 2. Increasing rim enhancing gas and fluid collection in the left upper quadrant superior to the spleen, concerning for an enlarging abscess. 3. Several persistent locules of pneumoperitoneum in the upper abdomen, predominantly in the left upper quadrant. There is a suggestion of a fistulous tract arising from the proximal lesser curvature of the stomach at this site of the surgical repair, extending toward the left upper quadrant gas collections. This could suggest breakdown of these surgical repair, however, this overall appears slightly improved compared to the prior study, which may suggest that if there is a breakdown of the surgical repair, that it may have walled off in the interim. Continued attention on followup studies is recommended. 4. Stable volume of rim enhancing fluid collections in the lower abdomen and pelvis compared to the prior study. 5. Additional incidental findings, as above.   Electronically Signed   By: Trudie Reed M.D.   On: 10/10/2013  11:45    Anti-infectives: Anti-infectives   Start     Dose/Rate Route Frequency Ordered Stop   09/30/13 1500  ceFEPIme (MAXIPIME) 2 g in dextrose 5 % 50 mL IVPB  Status:  Discontinued     2 g 100 mL/hr over 30 Minutes Intravenous Every 12 hours 09/30/13 1443 10/06/13 0822   09/26/13 1800  vancomycin (VANCOCIN) 50 mg/mL oral solution 250 mg  Status:  Discontinued     250 mg Oral 4 times per day 09/26/13 1510 09/26/13 1554   09/26/13 1600  metroNIDAZOLE (FLAGYL) IVPB 500 mg     500 mg 100 mL/hr over 60 Minutes Intravenous Every 6 hours 09/26/13 1556     09/26/13 1515  micafungin (MYCAMINE) 100 mg in sodium chloride 0.9 % 100 mL IVPB     100 mg 100 mL/hr over 1 Hours Intravenous Daily 09/26/13 1510     09/24/13 0930  piperacillin-tazobactam (ZOSYN) IVPB 3.375 g  Status:  Discontinued     3.375 g 12.5 mL/hr over 240 Minutes Intravenous 3 times per day 09/24/13 0832 09/30/13 1443   09/22/13 1400  metroNIDAZOLE (FLAGYL) IVPB 500 mg  Status:  Discontinued     500 mg 100 mL/hr over 60 Minutes Intravenous Every 8 hours 09/22/13 1119 09/26/13 1510   09/20/13 1230  fluconazole (DIFLUCAN) IVPB 200 mg  Status:  Discontinued     200 mg 100 mL/hr over 60 Minutes Intravenous Every 24 hours  09/20/13 1154 09/26/13 1510   09/20/13 1200  fluconazole (DIFLUCAN) IVPB 100 mg  Status:  Discontinued     100 mg 50 mL/hr over 60 Minutes Intravenous Every 24 hours 09/20/13 1151 09/20/13 1154   09/10/13 2200  metroNIDAZOLE (FLAGYL) IVPB 500 mg  Status:  Discontinued     500 mg 100 mL/hr over 60 Minutes Intravenous 3 times per day 09/10/13 1802 09/22/13 1118   09/10/13 2000  vancomycin (VANCOCIN) 50 mg/mL oral solution 500 mg  Status:  Discontinued     500 mg Oral 4 times per day 09/10/13 1802 09/21/13 1151   09/10/13 1800  vancomycin (VANCOCIN) 50 mg/mL oral solution 500 mg  Status:  Discontinued     500 mg Oral 4 times per day 09/10/13 1432 09/10/13 1802   09/10/13 1600  metroNIDAZOLE (FLAGYL) IVPB 500 mg   Status:  Discontinued     500 mg 100 mL/hr over 60 Minutes Intravenous 3 times per day 09/10/13 1432 09/10/13 1802   09/09/13 1602  vancomycin (VANCOCIN) 1,250 mg in sodium chloride 0.9 % 250 mL IVPB  Status:  Discontinued     1,250 mg 166.7 mL/hr over 90 Minutes Intravenous Every 24 hours 09/09/13 1057 09/10/13 1432   09/06/13 1600  imipenem-cilastatin (PRIMAXIN) 500 mg in sodium chloride 0.9 % 100 mL IVPB  Status:  Discontinued     500 mg 200 mL/hr over 30 Minutes Intravenous 3 times per day 09/06/13 1310 09/12/13 1048   09/03/13 1100  imipenem-cilastatin (PRIMAXIN) 500 mg in sodium chloride 0.9 % 100 mL IVPB  Status:  Discontinued     500 mg 200 mL/hr over 30 Minutes Intravenous Every 6 hours 09/03/13 1007 09/06/13 1310   09/02/13 1300  vancomycin (VANCOCIN) IVPB 1000 mg/200 mL premix  Status:  Discontinued     1,000 mg 200 mL/hr over 60 Minutes Intravenous Every 12 hours 09/01/13 1527 09/07/13 1340   08/30/13 2000  vancomycin (VANCOCIN) IVPB 750 mg/150 ml premix  Status:  Discontinued     750 mg 150 mL/hr over 60 Minutes Intravenous Every 8 hours 08/30/13 1356 09/01/13 1527   08/29/13 2200  micafungin (MYCAMINE) 100 mg in sodium chloride 0.9 % 100 mL IVPB  Status:  Discontinued     100 mg 100 mL/hr over 1 Hours Intravenous Every 24 hours 08/29/13 2036 09/10/13 1432   08/29/13 0400  vancomycin (VANCOCIN) IVPB 1000 mg/200 mL premix  Status:  Discontinued     1,000 mg 200 mL/hr over 60 Minutes Intravenous Every 8 hours 08/28/13 2358 08/30/13 1356   08/29/13 0400  piperacillin-tazobactam (ZOSYN) IVPB 3.375 g  Status:  Discontinued     3.375 g 12.5 mL/hr over 240 Minutes Intravenous 3 times per day 08/28/13 2358 09/03/13 1002   08/28/13 1845  [MAR Hold]  vancomycin (VANCOCIN) IVPB 1000 mg/200 mL premix     (On MAR Hold since 08/28/13 1957)   1,000 mg 200 mL/hr over 60 Minutes Intravenous  Once 08/28/13 1831 08/28/13 1955   08/28/13 1845  [MAR Hold]  piperacillin-tazobactam (ZOSYN) IVPB  3.375 g     (On MAR Hold since 08/28/13 1957)   3.375 g 100 mL/hr over 30 Minutes Intravenous  Once 08/28/13 1831 08/28/13 2056      Assessment/Plan: S/p  repair of perf gastric ulcer; rt lat abd abscess drainage 2/6, rt perihepatic abscess drainage 2/12; WBC up to 12.5 today; latest CT reviewed again by Dr. Cherene AltesHoss/CCS- decision made to proceed with CT guided aspiration/poss drainage of increasing LUQ fluid  collection today and remove rt abd drains. Details of above d/w pt with his understanding and consent.   LOS: 44 days    Kambrea Carrasco,D [facility] 10/11/2013

## 2013-10-11 NOTE — Procedures (Signed)
No note

## 2013-10-12 LAB — GLUCOSE, CAPILLARY
GLUCOSE-CAPILLARY: 103 mg/dL — AB (ref 70–99)
GLUCOSE-CAPILLARY: 117 mg/dL — AB (ref 70–99)
Glucose-Capillary: 153 mg/dL — ABNORMAL HIGH (ref 70–99)

## 2013-10-12 MED ORDER — M.V.I. ADULT IV INJ
INTRAVENOUS | Status: AC
  Administered 2013-10-12: 18:00:00 via INTRAVENOUS
  Filled 2013-10-12: qty 2700

## 2013-10-12 MED ORDER — FAT EMULSION 20 % IV EMUL
240.0000 mL | INTRAVENOUS | Status: AC
  Administered 2013-10-12: 240 mL via INTRAVENOUS
  Filled 2013-10-12 (×2): qty 250

## 2013-10-12 NOTE — Progress Notes (Signed)
45 Days Post-Op  Subjective: Pt doing ok; has some intermittent nausea, bringing up occ phlegm; no sig abd pain at present  Objective: Vital signs in last 24 hours: Temp:  [98.7 F (37.1 C)-99.6 F (37.6 C)] 99.6 F (37.6 C) (02/22 0500) Pulse Rate:  [99-115] 115 (02/22 0500) Resp:  [11-20] 18 (02/22 0500) BP: (119-138)/(75-90) 130/85 mmHg (02/22 0500) SpO2:  [97 %-100 %] 97 % (02/22 0500) Weight:  [193 lb 2 oz (87.6 kg)] 193 lb 2 oz (87.6 kg) (02/22 0642) Last BM Date: 10/11/13  Intake/Output from previous day: 02/21 0701 - 02/22 0700 In: 10 [I.V.:10] Out: 2943 [Urine:2525; Emesis/NG output:400; Drains:18] Intake/Output this shift: Total I/O In: -  Out: 175 [Urine:175]  LUQ drain intact, insertion site ok, output 15-20 cc's today turbid, reddish brown fluid; cx's pending; drain flushed with 10 cc's sterile NS; previous rt abd drain sites ok Lab Results:   Recent Labs  10/11/13 0552  WBC 12.5*  HGB 11.5*  HCT 35.3*  PLT 249   BMET  Recent Labs  10/11/13 0552  NA 135*  K 4.8  CL 100  CO2 25  GLUCOSE 93  BUN 28*  CREATININE 0.70  CALCIUM 8.7   PT/INR No results found for this basename: LABPROT, INR,  in the last 72 hours ABG No results found for this basename: PHART, PCO2, PO2, HCO3,  in the last 72 hours  Studies/Results: Ct Abdomen Pelvis W Contrast  10/10/2013   CLINICAL DATA:  48 year old male with history of perforated gastric ulcer status post surgical repair. Re-evaluate for persistent gastric leak or intra-abdominal abscess.  EXAM: CT ABDOMEN AND PELVIS WITH CONTRAST  TECHNIQUE: Multidetector CT imaging of the abdomen and pelvis was performed using the standard protocol following bolus administration of intravenous contrast.  CONTRAST:  OMNIPAQUE IOHEXOL 300 MG/ML  SOLN  COMPARISON:  CT of the abdomen and pelvis 10/02/2013.  FINDINGS: Lung Bases: Central venous catheter tip terminating at the superior cavoatrial junction. Nasogastric tube with tip  terminating at the gastroesophageal junction. Minimal dependent subsegmental atelectasis in the lower lobes of the lungs bilaterally. Trace left pleural effusion.  Abdomen/Pelvis: Open midline abdominal wound healing via secondary intention. There is a focal area of gastric wall thinning along the proximal lesser curvature of the stomach, at this site of prior perforation and primary repair. Immediately cephalad to this there is a potential tract, and adjacent locule of gas (image 21 of series 2), which could be indicative of slight breakdown at the site of surgical repair. Several other adjacent locules of pneumoperitoneum are noted in the left upper quadrant. These findings are similar to the prior examination 10/02/2013, although the amount of pneumoperitoneum in this region appears slightly decreased, which may suggest that if there is breakdown of the surgical repair, that it has walled off in the interim. There is a 9.6 x 5.1 x 3.0 cm thick walled rim enhancing gas and fluid collection superior to the spleen, which appears increased in size compared to the prior study, concerning for an abscess. This continues to contain a small amount of high attenuation material which could represent blood products or residual oral contrast material from a prior examination, however, the amount of high attenuation material has decreased compared to prior studies. Several smaller well-defined low-attenuation fluid collections are again noted adjacent to the greater curvature of the stomach and in the region of the gastrosplenic ligament, largest of which is adjacent to the tail of the pancreas measuring 4.7 cm in diameter (  image 33 of series 2), possibly small seromas and/or pancreatic pseudocysts.  Two surgical drains are noted in the epigastric region, predominantly surrounded by omental fat, which demonstrates extensive fat stranding and nodularity. There are also 2 pigtail drainage catheters adjacent to the liver, 1  inferior to the gallbladder fossa, with the other located posteriorly adjacent to segment 7 of the liver. Although there is a trace amount of perihepatic fluid and several tiny locules of gas adjacent to the right lobe of the liver, no well-defined abscess is noted in this region at this time.  There continue to be several collections of rim enhancing fluid in the lower abdomen and pelvis, however, these appear generally stable in volume compared to the prior study. No pathologic distention of small bowel. Numerous reactive sized lymph nodes are noted throughout the small bowel mesentery, omentum and retroperitoneum. Prostate gland and urinary bladder are unremarkable in appearance.  Musculoskeletal: There are no aggressive appearing lytic or blastic lesions noted in the visualized portions of the skeleton.  IMPRESSION: 1. Near complete resolution of perihepatic gas and fluid collection compared to the prior study, with 2 right-sided pigtail drainage catheters in place on today's examination. 2. Increasing rim enhancing gas and fluid collection in the left upper quadrant superior to the spleen, concerning for an enlarging abscess. 3. Several persistent locules of pneumoperitoneum in the upper abdomen, predominantly in the left upper quadrant. There is a suggestion of a fistulous tract arising from the proximal lesser curvature of the stomach at this site of the surgical repair, extending toward the left upper quadrant gas collections. This could suggest breakdown of these surgical repair, however, this overall appears slightly improved compared to the prior study, which may suggest that if there is a breakdown of the surgical repair, that it may have walled off in the interim. Continued attention on followup studies is recommended. 4. Stable volume of rim enhancing fluid collections in the lower abdomen and pelvis compared to the prior study. 5. Additional incidental findings, as above.   Electronically Signed   By:  Trudie Reed M.D.   On: 10/10/2013 11:45   Ct Image Guided Drainage By Percutaneous Catheter  10/11/2013   Art A Hoss, MD     10/11/2013 12:32 PM (No note.)   Anti-infectives: Anti-infectives   Start     Dose/Rate Route Frequency Ordered Stop   09/30/13 1500  ceFEPIme (MAXIPIME) 2 g in dextrose 5 % 50 mL IVPB  Status:  Discontinued     2 g 100 mL/hr over 30 Minutes Intravenous Every 12 hours 09/30/13 1443 10/06/13 0822   09/26/13 1800  vancomycin (VANCOCIN) 50 mg/mL oral solution 250 mg  Status:  Discontinued     250 mg Oral 4 times per day 09/26/13 1510 09/26/13 1554   09/26/13 1600  metroNIDAZOLE (FLAGYL) IVPB 500 mg     500 mg 100 mL/hr over 60 Minutes Intravenous Every 6 hours 09/26/13 1556     09/26/13 1515  micafungin (MYCAMINE) 100 mg in sodium chloride 0.9 % 100 mL IVPB     100 mg 100 mL/hr over 1 Hours Intravenous Daily 09/26/13 1510     09/24/13 0930  piperacillin-tazobactam (ZOSYN) IVPB 3.375 g  Status:  Discontinued     3.375 g 12.5 mL/hr over 240 Minutes Intravenous 3 times per day 09/24/13 0832 09/30/13 1443   09/22/13 1400  metroNIDAZOLE (FLAGYL) IVPB 500 mg  Status:  Discontinued     500 mg 100 mL/hr over 60 Minutes Intravenous Every  8 hours 09/22/13 1119 09/26/13 1510   09/20/13 1230  fluconazole (DIFLUCAN) IVPB 200 mg  Status:  Discontinued     200 mg 100 mL/hr over 60 Minutes Intravenous Every 24 hours 09/20/13 1154 09/26/13 1510   09/20/13 1200  fluconazole (DIFLUCAN) IVPB 100 mg  Status:  Discontinued     100 mg 50 mL/hr over 60 Minutes Intravenous Every 24 hours 09/20/13 1151 09/20/13 1154   09/10/13 2200  metroNIDAZOLE (FLAGYL) IVPB 500 mg  Status:  Discontinued     500 mg 100 mL/hr over 60 Minutes Intravenous 3 times per day 09/10/13 1802 09/22/13 1118   09/10/13 2000  vancomycin (VANCOCIN) 50 mg/mL oral solution 500 mg  Status:  Discontinued     500 mg Oral 4 times per day 09/10/13 1802 09/21/13 1151   09/10/13 1800  vancomycin (VANCOCIN) 50 mg/mL oral  solution 500 mg  Status:  Discontinued     500 mg Oral 4 times per day 09/10/13 1432 09/10/13 1802   09/10/13 1600  metroNIDAZOLE (FLAGYL) IVPB 500 mg  Status:  Discontinued     500 mg 100 mL/hr over 60 Minutes Intravenous 3 times per day 09/10/13 1432 09/10/13 1802   09/09/13 1602  vancomycin (VANCOCIN) 1,250 mg in sodium chloride 0.9 % 250 mL IVPB  Status:  Discontinued     1,250 mg 166.7 mL/hr over 90 Minutes Intravenous Every 24 hours 09/09/13 1057 09/10/13 1432   09/06/13 1600  imipenem-cilastatin (PRIMAXIN) 500 mg in sodium chloride 0.9 % 100 mL IVPB  Status:  Discontinued     500 mg 200 mL/hr over 30 Minutes Intravenous 3 times per day 09/06/13 1310 09/12/13 1048   09/03/13 1100  imipenem-cilastatin (PRIMAXIN) 500 mg in sodium chloride 0.9 % 100 mL IVPB  Status:  Discontinued     500 mg 200 mL/hr over 30 Minutes Intravenous Every 6 hours 09/03/13 1007 09/06/13 1310   09/02/13 1300  vancomycin (VANCOCIN) IVPB 1000 mg/200 mL premix  Status:  Discontinued     1,000 mg 200 mL/hr over 60 Minutes Intravenous Every 12 hours 09/01/13 1527 09/07/13 1340   08/30/13 2000  vancomycin (VANCOCIN) IVPB 750 mg/150 ml premix  Status:  Discontinued     750 mg 150 mL/hr over 60 Minutes Intravenous Every 8 hours 08/30/13 1356 09/01/13 1527   08/29/13 2200  micafungin (MYCAMINE) 100 mg in sodium chloride 0.9 % 100 mL IVPB  Status:  Discontinued     100 mg 100 mL/hr over 1 Hours Intravenous Every 24 hours 08/29/13 2036 09/10/13 1432   08/29/13 0400  vancomycin (VANCOCIN) IVPB 1000 mg/200 mL premix  Status:  Discontinued     1,000 mg 200 mL/hr over 60 Minutes Intravenous Every 8 hours 08/28/13 2358 08/30/13 1356   08/29/13 0400  piperacillin-tazobactam (ZOSYN) IVPB 3.375 g  Status:  Discontinued     3.375 g 12.5 mL/hr over 240 Minutes Intravenous 3 times per day 08/28/13 2358 09/03/13 1002   08/28/13 1845  [MAR Hold]  vancomycin (VANCOCIN) IVPB 1000 mg/200 mL premix     (On MAR Hold since 08/28/13 1957)    1,000 mg 200 mL/hr over 60 Minutes Intravenous  Once 08/28/13 1831 08/28/13 1955   08/28/13 1845  [MAR Hold]  piperacillin-tazobactam (ZOSYN) IVPB 3.375 g     (On MAR Hold since 08/28/13 1957)   3.375 g 100 mL/hr over 30 Minutes Intravenous  Once 08/28/13 1831 08/28/13 2056      Assessment/Plan: s/p LUQ fluid collection drainage/removal of rt abd  drains(IR placed) 2/21; check LUQ drain fluid cx's, monitor labs; check f/u CT latter part of next week   LOS: 45 days    Kourtnie Sachs,D [facility] 10/12/2013

## 2013-10-12 NOTE — Progress Notes (Signed)
45 Days Post-Op  Subjective: Condition unchanged. Cord and stable. Comfortable. No new pain.  New left subphrenic drain placed yesterday and periodic fluid aspirated and removed. Some of the right-sided drains were also removed.  Remains on bowel rest, NG tube decompression, TNA, antifungal coverage  Objective: Vital signs in last 24 hours: Temp:  [98.7 F (37.1 C)-99.6 F (37.6 C)] 99.6 F (37.6 C) (02/22 0500) Pulse Rate:  [99-115] 115 (02/22 0500) Resp:  [11-20] 18 (02/22 0500) BP: (119-138)/(75-90) 130/85 mmHg (02/22 0500) SpO2:  [97 %-100 %] 97 % (02/22 0500) Weight:  [191 lb 9.6 oz (86.909 kg)] 191 lb 9.6 oz (86.909 kg) (02/21 0939) Last BM Date: 10/11/13  Intake/Output from previous day: 02/21 0701 - 02/22 0700 In: 10 [I.V.:10] Out: 2943 [Urine:2525; Emesis/NG output:400; Drains:18] Intake/Output this shift: Total I/O In: -  Out: 2225 [Urine:1825; Emesis/NG output:400]  General appearance: alert. Oriented. Mental status normal. No distress. GI: abdomen soft, nontender, nondistended. Midline wound clean and granulating.New left-sided drain with periodic fluid. Gram stain pending  Lab Results:  Results for orders placed during the hospital encounter of 08/28/13 (from the past 24 hour(s))  GLUCOSE, CAPILLARY     Status: Abnormal   Collection Time    10/11/13  8:23 PM      Result Value Ref Range   Glucose-Capillary 180 (*) 70 - 99 mg/dL   Comment 1 Notify RN    GLUCOSE, CAPILLARY     Status: Abnormal   Collection Time    10/12/13 12:50 AM      Result Value Ref Range   Glucose-Capillary 153 (*) 70 - 99 mg/dL   Comment 1 Notify RN    GLUCOSE, CAPILLARY     Status: Abnormal   Collection Time    10/12/13  5:58 AM      Result Value Ref Range   Glucose-Capillary 103 (*) 70 - 99 mg/dL     Studies/Results: @RISRSLT24 @  . sodium chloride   Intravenous Q24H  . antiseptic oral rinse  15 mL Mouth Rinse q12n4p  . chlorhexidine  15 mL Mouth Rinse BID  . enoxaparin  (LOVENOX) injection  40 mg Subcutaneous Q24H  . insulin aspart  0-15 Units Subcutaneous 4 times per day  . metoprolol  7.5 mg Intravenous Q4H  . metronidazole  500 mg Intravenous Q6H  . micafungin (MYCAMINE) IV  100 mg Intravenous Daily  . pantoprazole (PROTONIX) IV  40 mg Intravenous Q12H  . sodium chloride  10-40 mL Intracatheter Q12H     Assessment/Plan: s/p Procedure(s): EXPLORATORY LAPAROTOMY,debridment of nacrotic stomach,and primary repair of perforated stomach.  Perforated gastric ulcer s/p exploratory laparotomy Dr. Andrey CampanileWilson 08/28/13  POD#44  -s/p IR drain placement to RUQ x2. 2 additional surgical drains placed at time of surgery  -repeat CT still with extravasation, pelvic fluid collection resolved, fluid collection to dome of liver-->additional drain placed on 2/13  -Micafungin 2/6---> Continue per ID reference positive yeast cultures from 10/02/13. Indication: UGI fistula, multiple intra-abdominal fluid collections.  Right-sided drains have successfully evacuated right-sided fluid collections And were removed yesterday. New left subphrenic drain successfully placed yesterday. -midline wound has dehisced, continue with BID wet to dry dressing changes, abdominal binder  -Continue NGT to suction, TPN, NPO  -VTE prophylaxis; SCDs, lovenox  -Discuss Drain labelling with Nurse..   Increasing left subphrenic and left splenic hilar fluid collections.  Successfully drained in IR yesterday. Cultures pending  ID - Continues on Flagyl and micafungin. Will need to rethink antibiotic duration and discuss with  infectious disease, given new abscess drainage yesterday.  PCM  -continue with TPN  C. Diff colitis  -treatment completed. Continue with flagyl while on antibiotics to reduce recurrence  Acute respiratory failure-resolved  NSTEMI-ischemia demand, stable, OP cards follow up  Acute systolic heart failure-echo 08/29/13 with diffuse hypokinesis with EF 40-45%, poor study, recommend  repeat. Will need OP cards f/u  Septic shock 2/2 peritonitis-resolved  AKI-resolved  Acute encephalopathy-resolved  Depression/anxiety -psych consulted. MVH:QIONGE sertraline 50mg  PO daily when tolerating orals.  Anemia of critical illness-stable  Hypokalemia-resolved     @PROBHOSP @  LOS: 45 days    James Mccormick 10/12/2013  . .prob

## 2013-10-12 NOTE — Progress Notes (Signed)
PARENTERAL NUTRITION CONSULT NOTE - FOLLOW UP  Pharmacy Consult for TPN Indication: Prolonged ileus  No Known Allergies  Patient Measurements: Height: 5\' 11"  (180.3 cm) Weight: 193 lb 2 oz (87.6 kg) IBW/kg (Calculated) : 75.3    Vital Signs: Temp: 99.6 F (37.6 C) (02/22 0500) Temp src: Oral (02/22 0500) BP: 130/85 mmHg (02/22 0500) Pulse Rate: 115 (02/22 0500) Intake/Output from previous day: 02/21 0701 - 02/22 0700 In: 10 [I.V.:10] Out: 2943 [Urine:2525; Emesis/NG output:400; Drains:18] Intake/Output from this shift:    Labs:  Recent Labs  10/11/13 0552  WBC 12.5*  HGB 11.5*  HCT 35.3*  PLT 249     Recent Labs  10/11/13 0552  NA 135*  K 4.8  CL 100  CO2 25  GLUCOSE 93  BUN 28*  CREATININE 0.70  CALCIUM 8.7   Estimated Creatinine Clearance: 119 ml/min (by C-G formula based on Cr of 0.7).    Recent Labs  10/11/13 2023 10/12/13 0050 10/12/13 0558  GLUCAP 180* 153* 103*    Insulin Requirements in the past 24 hours:  40 units insulin in TPN + 6 units mod SSI   Current Nutrition:  Clinimix E 5/15 2400 mL over 12 hours + lipid emulsion 20% at 240 mL over 12 hr- provides 120 gm protein and 2352 kcal which is 92% protein goals and 100% kcal goals  NPO   Nutritional Goals:  2250-2450 kcal and 130-140 gm protein per RD 2/18.   Assessment:  48 y.o. male presented to ED 1/8 with abd pain. CT consistent with perforated stomach ulcer due to NSAID use, pneumoperitoneum, peritonitis. Taken emergently to OR where he underwent exp lap, debridement of necrotic stomach, repair of perforated stomach. Contrast study showed no leak from stomach or small bowel.   GI: CT scan 1/31 poss persistent gastric perf, fluid collection 4.5x4.9, and 3rd collection between bladder and rectum. 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 Will require NPO/NG/TNA for a long time- TPN has been transitioned to cyclic over 12 hours; Abdominal drains X 2.  NGT low suction  Considering  GJT for benefit of enteral feeding vs tpn   He had CT guided aspiration LUQ fluid collection and right drains removed, new L subphrenic drain  Nutrition: RD recs to increase TPN slightly as pt continues to lose weight.  Prealbumin 23.3  Weight up to 193#  Endo: No h/o DM. CBGs  98 off TPN ,180 on TPN. 40 units of insulin in bag.   Lytes:  No lytes today  Heptobili: LFTs wnl, albumin  low at 2.3, TG 187- down a bit,  Palb 23.3  Renal:  Cr < 1, UOP  1.2 ml/kg/hr  ID:  WBC wnl, afebrile; per ID note, cont Micafungin as yeast in culture 2/12, to remain on IV Flagyl while on abx to prevent recurrence of CDiff.  CT guided asp'n of perihepatic fluid 2/13- cultures with yeast.  Repeat CDiff (-) on 2/14.  Cultures of left peritoneal abscess pending  Flagyl 1/21 >>  Micafungin 1/9>>1/21; 2/6 >> Primaxin 1/14>>1/23  Vanc PO 1/21>>1/31  Fluconazole 1/31>>2/6  Cefepime 2/10 >> 2/16 Vanc 1/8>>1/21 Zosyn 1/8>>1/14; 2/14 >> 2/10  2/21 abscess L peritoneal: 2/12 intra-abd fluid: (+)yeast 2/6 peritoneal abscess: NF  2/1 peritoneal cavity: few candida glabrata  1/30 urine: NF  1/21 CDiff Positive  1/16 Trach aspirate: NF  1/9 Blood cultures: NF   TPN Access: triple lumen PICC 1/20; replaced 1/27  TPN day#: 43  Plan:  1. Cont cyclic TPN  to 2700 mls over 12 hours.  Clinimix E 5/15 + 40 units of regular insulin at cyclic rate- 50 ml/hr x 1 hr, then run at 260 ml/hr x 10 hrs, then run at 50 ml/hr x1 hr for 12 hours total.  20% Lipids to run at 6420mL/hr for 12 hours.  This new rate provides 135 gm protein and 2397 Kcals = 100% of goal.  2. Continue 1/2NS at 10-4120mL/hr ONLY when TPN is not running (0600-1800)  3. CBG checks on and off TPN  4 . Follow up plans for feeding tube 5.  Trace elements on MWF only due to Pulte Homesshortage  Neidy Guerrieri, Pharm.D. [phone] 10/12/2013 8:00 AM

## 2013-10-13 DIAGNOSIS — E46 Unspecified protein-calorie malnutrition: Secondary | ICD-10-CM

## 2013-10-13 LAB — CBC
HEMATOCRIT: 34.1 % — AB (ref 39.0–52.0)
Hemoglobin: 11.2 g/dL — ABNORMAL LOW (ref 13.0–17.0)
MCH: 29.4 pg (ref 26.0–34.0)
MCHC: 32.8 g/dL (ref 30.0–36.0)
MCV: 89.5 fL (ref 78.0–100.0)
Platelets: 236 10*3/uL (ref 150–400)
RBC: 3.81 MIL/uL — ABNORMAL LOW (ref 4.22–5.81)
RDW: 15.1 % (ref 11.5–15.5)
WBC: 11.2 10*3/uL — ABNORMAL HIGH (ref 4.0–10.5)

## 2013-10-13 LAB — DIFFERENTIAL
BASOS PCT: 0 % (ref 0–1)
Basophils Absolute: 0 10*3/uL (ref 0.0–0.1)
EOS ABS: 0.1 10*3/uL (ref 0.0–0.7)
EOS PCT: 1 % (ref 0–5)
Lymphocytes Relative: 35 % (ref 12–46)
Lymphs Abs: 3.9 10*3/uL (ref 0.7–4.0)
MONOS PCT: 11 % (ref 3–12)
Monocytes Absolute: 1.2 10*3/uL — ABNORMAL HIGH (ref 0.1–1.0)
NEUTROS ABS: 6 10*3/uL (ref 1.7–7.7)
Neutrophils Relative %: 53 % (ref 43–77)

## 2013-10-13 LAB — COMPREHENSIVE METABOLIC PANEL
ALBUMIN: 2.4 g/dL — AB (ref 3.5–5.2)
ALK PHOS: 87 U/L (ref 39–117)
ALT: 11 U/L (ref 0–53)
AST: 14 U/L (ref 0–37)
BILIRUBIN TOTAL: 0.3 mg/dL (ref 0.3–1.2)
BUN: 29 mg/dL — ABNORMAL HIGH (ref 6–23)
CHLORIDE: 100 meq/L (ref 96–112)
CO2: 25 mEq/L (ref 19–32)
Calcium: 8.5 mg/dL (ref 8.4–10.5)
Creatinine, Ser: 0.63 mg/dL (ref 0.50–1.35)
GFR calc Af Amer: >90 mL/min (ref >90)
GFR calc non Af Amer: >90 mL/min (ref >90)
Glucose, Bld: 103 mg/dL — ABNORMAL HIGH (ref 70–99)
POTASSIUM: 4.7 meq/L (ref 3.7–5.3)
Sodium: 135 mEq/L — ABNORMAL LOW (ref 137–147)
Total Protein: 6.8 g/dL (ref 6.0–8.3)

## 2013-10-13 LAB — TRIGLYCERIDES: Triglycerides: 155 mg/dL — ABNORMAL HIGH (ref <150)

## 2013-10-13 LAB — GLUCOSE, CAPILLARY
GLUCOSE-CAPILLARY: 84 mg/dL (ref 70–99)
GLUCOSE-CAPILLARY: 135 mg/dL — AB (ref 70–99)
Glucose-Capillary: 106 mg/dL — ABNORMAL HIGH (ref 70–99)
Glucose-Capillary: 155 mg/dL — ABNORMAL HIGH (ref 70–99)

## 2013-10-13 LAB — PHOSPHORUS: Phosphorus: 4.9 mg/dL — ABNORMAL HIGH (ref 2.3–4.6)

## 2013-10-13 LAB — PREALBUMIN: Prealbumin: 20.5 mg/dL (ref 17.0–34.0)

## 2013-10-13 LAB — MAGNESIUM: Magnesium: 1.9 mg/dL (ref 1.5–2.5)

## 2013-10-13 MED ORDER — FAT EMULSION 20 % IV EMUL
250.0000 mL | INTRAVENOUS | Status: AC
  Administered 2013-10-13: 250 mL via INTRAVENOUS
  Filled 2013-10-13: qty 250

## 2013-10-13 MED ORDER — TRACE MINERALS CR-CU-F-FE-I-MN-MO-SE-ZN IV SOLN
INTRAVENOUS | Status: AC
  Administered 2013-10-13: 18:00:00 via INTRAVENOUS
  Filled 2013-10-13: qty 2700

## 2013-10-13 NOTE — Progress Notes (Signed)
New L suprasplenic fluid collection drained - f/u cultures; if new culture positive for yeast - will need to discuss with ID duration of antifungal.  Cont TPN Stable for [facility]  Mary SellaEric M. Andrey CampanileWilson, MD, FACS General, Bariatric, & Minimally Invasive Surgery [facility], [place]

## 2013-10-13 NOTE — Care Management Note (Signed)
10-13-13 Per PA medically ready for transfer [facility] . [facility] not [facility] available today . [facility] will call when bed available .  Ronny FlurryHeather Mishon Blubaugh RN BSN [phone]

## 2013-10-13 NOTE — Progress Notes (Signed)
PARENTERAL NUTRITION CONSULT NOTE - FOLLOW UP  Pharmacy Consult:  TPN Indication:  Prolonged ileus  No Known Allergies  Patient Measurements: Height: 5\' 11"  (180.3 cm) Weight: 193 lb 2 oz (87.6 kg) IBW/kg (Calculated) : 75.3  Vital Signs: Temp: 99.8 F (37.7 C) (02/23 0655) Temp src: Oral (02/23 0655) BP: 120/84 mmHg (02/23 0655) Pulse Rate: 97 (02/23 0655) Intake/Output from previous day: 02/22 0701 - 02/23 0700 In: 3630 [I.V.:920; IV Piggyback:100; TPN:2600] Out: 2478 [Urine:1695; Emesis/NG output:750; Drains:33]  Labs:  Recent Labs  10/11/13 0552 10/13/13 0612  WBC 12.5* 11.2*  HGB 11.5* 11.2*  HCT 35.3* 34.1*  PLT 249 236     Recent Labs  10/11/13 0552 10/13/13 0612  NA 135* 135*  K 4.8 4.7  CL 100 100  CO2 25 25  GLUCOSE 93 103*  BUN 28* 29*  CREATININE 0.70 0.63  CALCIUM 8.7 8.5  MG  --  1.9  PHOS  --  4.9*  PROT  --  6.8  ALBUMIN  --  2.4*  AST  --  14  ALT  --  11  ALKPHOS  --  87  BILITOT  --  0.3  TRIG  --  155*   Estimated Creatinine Clearance: 119 ml/min (by C-G formula based on Cr of 0.63).    Recent Labs  10/12/13 1158 10/13/13 0001 10/13/13 0706  GLUCAP 117* 155* 84    Insulin Requirements in the past 24 hours:  3 units moderate SSI + 40 units insulin in TPN  Assessment:  47 YOM presented to the ED on 08/28/13 with abdominal pain.  CT consistent with perforated stomach ulcer due to NSAID use, pneumoperitoneum, and peritonitis.  He was taken emergently to the OR where he underwent ex-lap, debridement of necrotic stomach, and repair of perforated stomach. Patient continues on TPN for nutritional support.  GI: CT scan 1/31 poss persistent gastric perf, fluid collection b/w bladder and rectum, large stomach wall defect and fistula.  Considering GJT for benefit of enteral feeding vs tpn   He had CT guided aspiration LUQ fluid collection and right drains removed, new left subphrenic drain.  Prealbumin 23.3 but patient was losing  weight.  Abdominal drains x2.  NGT low suction (O/P ) - PPI IV Endo: no hx DM, CBGs controlled on and off of TPN Lytes: low Na, Phos mildly elevated at 4.9, others WNL Heptobili: LFTs/tbili WNL, TG down 155 Renal: SCr 0.63 (stable), CrCL 119 ml/min, good UOP ID:  Micafungin for yeast in intra-abd culture + IV Flagyl while on abx to prevent recurrence of C.diff.  CT guided asp'n of perihepatic fluid 2/13 - cultures with yeast.  Afebrile, WBC trending down  Flagyl 1/21 >>  Micafungin 1/9>>1/21; 2/6 >> Primaxin 1/14>>1/23  Vanc PO 1/21>>1/31  Fluconazole 1/31>>2/6  Cefepime 2/10 >> 2/16 Vanc 1/8>>1/21 Zosyn 1/8>>1/14; 2/14 >> 2/10  2/21 abscess L peritoneal: 2/12 intra-abd fluid: (+)yeast 2/6 peritoneal abscess: NF  2/1 peritoneal cavity: few candida glabrata  1/30 urine: NF  1/21 CDiff Positive  1/16 Trach aspirate: NF  1/9 Blood cultures: NF   TPN Access: triple lumen PICC 1/20; replaced 1/27  TPN day#: 44  Current Nutrition:  Clinimix E 5/15 2400 mL over 12 hours + lipid emulsion 20% at 240 mL over 12 hr- provides 120 gm protein and 2352 kcal which is 92% protein goals and 100% kcal goals   Nutritional Goals:  2250-2450 kcal and 130-140 gm protein   Plan:  - Continue Cylic Clinimix E  5/15 over 12 hours: 50 ml/hr x 1 hr, then 260 ml/hr x 10 hrs, then 50 ml/hr x1 hr - IVFE at 20 ml/hr x 12 hrs - TPN providing 2397 kCal and 135gm protein, meeting 100% of needs - Continue 1/2NS at 10-7920mL/hr ONLY when TPN is not running (0600-1800)  - Daily IV multivitamin in TPN - Trace elements on MWF only d/t national shortage   Anay Rathe D. Laney Potashang, PharmD, BCPS Pager:  [phone] 10/13/2013, 11:23 AM

## 2013-10-13 NOTE — Progress Notes (Signed)
46 Days Post-Op  Subjective: Pt states he is feeling better from over the weekend.  States his Nausea is better.  He reports a mild amount of abdominal tenderness associated with coughing.  He states he continues to produce clear sputum when he coughs.  He states his wound dressings continue to be non-tender, and that he has walked less over the weekend than he had last week.    Objective: Vital signs in last 24 hours: Temp:  [99 F (37.2 C)-99.8 F (37.7 C)] 99.8 F (37.7 C) (02/23 0655) Pulse Rate:  [95-98] 97 (02/23 0655) Resp:  [18-19] 19 (02/23 0655) BP: (120-138)/(84-89) 120/84 mmHg (02/23 0655) SpO2:  [100 %] 100 % (02/23 0655) Last BM Date: 10/11/13  Intake/Output from previous day: 02/22 0701 - 02/23 0700 In: 1030 [I.V.:920; IV Piggyback:100] Out: 2478 [Urine:1695; Emesis/NG output:750; Drains:33] Intake/Output this shift:    PE: Gen:  Alert, NAD, pleasant Card:  RRR, no M/G/R heard Pulm:  CTA, no W/R/R. Incentive Spirometry up to 1500. Abd: Soft, non-distended.  Mild tenderness in LUQ.   Drain R1: Clear, with a small amount of tissue.  Drain 1 (Left side): Small amount of milky discharge.   Subphrenic drain   (LUQ) Small amount of light reddish discharge.  Wound covered with dressing and corset.   Lab Results:   Recent Labs  10/11/13 0552 10/13/13 0612  WBC 12.5* 11.2*  HGB 11.5* 11.2*  HCT 35.3* 34.1*  PLT 249 236   BMET  Recent Labs  10/11/13 0552 10/13/13 0612  NA 135* 135*  K 4.8 4.7  CL 100 100  CO2 25 25  GLUCOSE 93 103*  BUN 28* 29*  CREATININE 0.70 0.63  CALCIUM 8.7 8.5   PT/INR No results found for this basename: LABPROT, INR,  in the last 72 hours CMP     Component Value Date/Time   NA 135* 10/13/2013 0612   K 4.7 10/13/2013 0612   CL 100 10/13/2013 0612   CO2 25 10/13/2013 0612   GLUCOSE 103* 10/13/2013 0612   BUN 29* 10/13/2013 0612   CREATININE 0.63 10/13/2013 0612   CALCIUM 8.5 10/13/2013 0612   PROT 6.8 10/13/2013 0612   ALBUMIN  2.4* 10/13/2013 0612   AST 14 10/13/2013 0612   ALT 11 10/13/2013 0612   ALKPHOS 87 10/13/2013 0612   BILITOT 0.3 10/13/2013 0612   GFRNONAA >90 10/13/2013 0612   GFRAA >90 10/13/2013 0612   Lipase     Component Value Date/Time   LIPASE 14 08/29/2013 0930       Studies/Results: Ct Image Guided Drainage By Percutaneous Catheter  10/11/2013   Art A Hoss, MD     10/11/2013 12:32 PM (No note.)   Anti-infectives: Anti-infectives   Start     Dose/Rate Route Frequency Ordered Stop   09/30/13 1500  ceFEPIme (MAXIPIME) 2 g in dextrose 5 % 50 mL IVPB  Status:  Discontinued     2 g 100 mL/hr over 30 Minutes Intravenous Every 12 hours 09/30/13 1443 10/06/13 0822   09/26/13 1800  vancomycin (VANCOCIN) 50 mg/mL oral solution 250 mg  Status:  Discontinued     250 mg Oral 4 times per day 09/26/13 1510 09/26/13 1554   09/26/13 1600  metroNIDAZOLE (FLAGYL) IVPB 500 mg     500 mg 100 mL/hr over 60 Minutes Intravenous Every 6 hours 09/26/13 1556     09/26/13 1515  micafungin (MYCAMINE) 100 mg in sodium chloride 0.9 % 100 mL IVPB  100 mg 100 mL/hr over 1 Hours Intravenous Daily 09/26/13 1510     09/24/13 0930  piperacillin-tazobactam (ZOSYN) IVPB 3.375 g  Status:  Discontinued     3.375 g 12.5 mL/hr over 240 Minutes Intravenous 3 times per day 09/24/13 0832 09/30/13 1443   09/22/13 1400  metroNIDAZOLE (FLAGYL) IVPB 500 mg  Status:  Discontinued     500 mg 100 mL/hr over 60 Minutes Intravenous Every 8 hours 09/22/13 1119 09/26/13 1510   09/20/13 1230  fluconazole (DIFLUCAN) IVPB 200 mg  Status:  Discontinued     200 mg 100 mL/hr over 60 Minutes Intravenous Every 24 hours 09/20/13 1154 09/26/13 1510   09/20/13 1200  fluconazole (DIFLUCAN) IVPB 100 mg  Status:  Discontinued     100 mg 50 mL/hr over 60 Minutes Intravenous Every 24 hours 09/20/13 1151 09/20/13 1154   09/10/13 2200  metroNIDAZOLE (FLAGYL) IVPB 500 mg  Status:  Discontinued     500 mg 100 mL/hr over 60 Minutes Intravenous 3 times per  day 09/10/13 1802 09/22/13 1118   09/10/13 2000  vancomycin (VANCOCIN) 50 mg/mL oral solution 500 mg  Status:  Discontinued     500 mg Oral 4 times per day 09/10/13 1802 09/21/13 1151   09/10/13 1800  vancomycin (VANCOCIN) 50 mg/mL oral solution 500 mg  Status:  Discontinued     500 mg Oral 4 times per day 09/10/13 1432 09/10/13 1802   09/10/13 1600  metroNIDAZOLE (FLAGYL) IVPB 500 mg  Status:  Discontinued     500 mg 100 mL/hr over 60 Minutes Intravenous 3 times per day 09/10/13 1432 09/10/13 1802   09/09/13 1602  vancomycin (VANCOCIN) 1,250 mg in sodium chloride 0.9 % 250 mL IVPB  Status:  Discontinued     1,250 mg 166.7 mL/hr over 90 Minutes Intravenous Every 24 hours 09/09/13 1057 09/10/13 1432   09/06/13 1600  imipenem-cilastatin (PRIMAXIN) 500 mg in sodium chloride 0.9 % 100 mL IVPB  Status:  Discontinued     500 mg 200 mL/hr over 30 Minutes Intravenous 3 times per day 09/06/13 1310 09/12/13 1048   09/03/13 1100  imipenem-cilastatin (PRIMAXIN) 500 mg in sodium chloride 0.9 % 100 mL IVPB  Status:  Discontinued     500 mg 200 mL/hr over 30 Minutes Intravenous Every 6 hours 09/03/13 1007 09/06/13 1310   09/02/13 1300  vancomycin (VANCOCIN) IVPB 1000 mg/200 mL premix  Status:  Discontinued     1,000 mg 200 mL/hr over 60 Minutes Intravenous Every 12 hours 09/01/13 1527 09/07/13 1340   08/30/13 2000  vancomycin (VANCOCIN) IVPB 750 mg/150 ml premix  Status:  Discontinued     750 mg 150 mL/hr over 60 Minutes Intravenous Every 8 hours 08/30/13 1356 09/01/13 1527   08/29/13 2200  micafungin (MYCAMINE) 100 mg in sodium chloride 0.9 % 100 mL IVPB  Status:  Discontinued     100 mg 100 mL/hr over 1 Hours Intravenous Every 24 hours 08/29/13 2036 09/10/13 1432   08/29/13 0400  vancomycin (VANCOCIN) IVPB 1000 mg/200 mL premix  Status:  Discontinued     1,000 mg 200 mL/hr over 60 Minutes Intravenous Every 8 hours 08/28/13 2358 08/30/13 1356   08/29/13 0400  piperacillin-tazobactam (ZOSYN) IVPB 3.375  g  Status:  Discontinued     3.375 g 12.5 mL/hr over 240 Minutes Intravenous 3 times per day 08/28/13 2358 09/03/13 1002   08/28/13 1845  [MAR Hold]  vancomycin (VANCOCIN) IVPB 1000 mg/200 mL premix     (On  MAR Hold since 08/28/13 1957)   1,000 mg 200 mL/hr over 60 Minutes Intravenous  Once 08/28/13 1831 08/28/13 1955   08/28/13 1845  [MAR Hold]  piperacillin-tazobactam (ZOSYN) IVPB 3.375 g     (On MAR Hold since 08/28/13 1957)   3.375 g 100 mL/hr over 30 Minutes Intravenous  Once 08/28/13 1831 08/28/13 2056     Day 35 of Metronidazole Day 18 of Micafungin   Assessment/Plan s/p Procedure(s):  EXPLORATORY LAPAROTOMY,debridment of nacrotic stomach,and primary repair of perforated stomach.   Perforated gastric ulcer s/p exploratory laparotomy Dr. Andrey Campanile 08/28/13  POD#46  2  surgical drains placed at time of surgery New left subphrenic drain successfully placed on 10/11/13 -repeat CT still with extravasation, pelvic fluid collection resolved, fluid collection to dome of liver-->additional drain placed on 2/13  -Micafungin 2/6---> Continue per ID reference positive yeast cultures from 10/02/13. Indication: UGI fistula, multiple intra-abdominal fluid collections.  -Right-sided drains have successfully evacuated right-sided fluid collections and were removed on 10/11/13 -midline wound has dehisced, continue with BID wet to dry dressing changes, abdominal binder  -Continue NGT to suction, TPN, NPO  -VTE prophylaxis; SCDs, lovenox  -Consider transfer to [facility]. Discuss with MD for timing.   Increasing left subphrenic and left splenic hilar fluid collections. Successfully drained in IR 10/11/13 Cultures NTD  ID - Continues on Flagyl and micafungin. Will need to rethink antibiotic duration and discuss with infectious disease, given new abscess drainage on 10/11/13  [facility] -continue with TPN  C. Diff colitis -treatment completed. Continue with flagyl while on antibiotics to reduce recurrence   Acute respiratory failure-resolved  NSTEMI-ischemia demand, stable, OP cards follow up  Acute systolic heart failure-echo 08/29/13 with diffuse hypokinesis with EF 40-45%, poor study, recommend repeat. Will need OP cards f/u  Septic shock 2/2 peritonitis-resolved  AKI-resolved  Acute encephalopathy-resolved  Depression/anxiety -psych consulted. WUJ:WJXBJY sertraline 50mg  PO daily when tolerating orals.  Anemia of critical illness-stable  Hypokalemia-resolved      LOS: 46 days    Michael A. Dion [employer] 10/13/2013, 7:58 AM [facility] Phone #: [phone]

## 2013-10-13 NOTE — Progress Notes (Signed)
Orthopedic Tech Progress Note Patient Details:  James LeydenChristopher D Mccormick 07/16/1966 [id number] Replacement binder provided for patient Ortho Devices Type of Ortho Device: Abdominal binder Ortho Device/Splint Interventions: Ordered   [place] R Thompson 10/13/2013, 2:42 PM

## 2013-10-13 NOTE — Progress Notes (Signed)
Lipids not running , pump beeping occluded. Attempted to flush port that lipids was connected to and could no flush through that  [place]. Nutrition is running without difficulty through the primary line. Lipids stopped and  pharmacy notified.

## 2013-10-13 NOTE — Progress Notes (Signed)
46 Days Post-Op  Subjective: Perforated gastric ulcer Surgical intervention 1/8 New L upper abd drain placed 2/21 by IR   Objective: Vital signs in last 24 hours: Temp:  [99 F (37.2 C)-99.8 F (37.7 C)] 99.8 F (37.7 C) (02/23 0655) Pulse Rate:  [95-98] 97 (02/23 0655) Resp:  [18-19] 19 (02/23 0655) BP: (120-138)/(84-89) 120/84 mmHg (02/23 0655) SpO2:  [100 %] 100 % (02/23 0655) Last BM Date: 10/11/13  Intake/Output from previous day: 02/22 0701 - 02/23 0700 In: 3630 [I.V.:920; IV Piggyback:100; TPN:2600] Out: 2478 [Urine:1695; Emesis/NG output:750; Drains:33] Intake/Output this shift:    PE:  Afeb; vss Wbc 11.2 Drain site clean and dry; NT Output reddish/cloudy fluid 15 cc recorded today 10 cc in JP Cx no growth in 2 days   Lab Results:   Recent Labs  10/11/13 0552 10/13/13 0612  WBC 12.5* 11.2*  HGB 11.5* 11.2*  HCT 35.3* 34.1*  PLT 249 236   BMET  Recent Labs  10/11/13 0552 10/13/13 0612  NA 135* 135*  K 4.8 4.7  CL 100 100  CO2 25 25  GLUCOSE 93 103*  BUN 28* 29*  CREATININE 0.70 0.63  CALCIUM 8.7 8.5   PT/INR No results found for this basename: LABPROT, INR,  in the last 72 hours ABG No results found for this basename: PHART, PCO2, PO2, HCO3,  in the last 72 hours  Studies/Results: Ct Image Guided Drainage By Percutaneous Catheter  10/11/2013   Art Mccormick Hoss, MD     10/11/2013 12:32 PM (No note.)   Anti-infectives: Anti-infectives   Start     Dose/Rate Route Frequency Ordered Stop   09/30/13 1500  ceFEPIme (MAXIPIME) 2 g in dextrose 5 % 50 mL IVPB  Status:  Discontinued     2 g 100 mL/hr over 30 Minutes Intravenous Every 12 hours 09/30/13 1443 10/06/13 0822   09/26/13 1800  vancomycin (VANCOCIN) 50 mg/mL oral solution 250 mg  Status:  Discontinued     250 mg Oral 4 times per day 09/26/13 1510 09/26/13 1554   09/26/13 1600  metroNIDAZOLE (FLAGYL) IVPB 500 mg     500 mg 100 mL/hr over 60 Minutes Intravenous Every 6 hours 09/26/13 1556      09/26/13 1515  micafungin (MYCAMINE) 100 mg in sodium chloride 0.9 % 100 mL IVPB     100 mg 100 mL/hr over 1 Hours Intravenous Daily 09/26/13 1510     09/24/13 0930  piperacillin-tazobactam (ZOSYN) IVPB 3.375 g  Status:  Discontinued     3.375 g 12.5 mL/hr over 240 Minutes Intravenous 3 times per day 09/24/13 0832 09/30/13 1443   09/22/13 1400  metroNIDAZOLE (FLAGYL) IVPB 500 mg  Status:  Discontinued     500 mg 100 mL/hr over 60 Minutes Intravenous Every 8 hours 09/22/13 1119 09/26/13 1510   09/20/13 1230  fluconazole (DIFLUCAN) IVPB 200 mg  Status:  Discontinued     200 mg 100 mL/hr over 60 Minutes Intravenous Every 24 hours 09/20/13 1154 09/26/13 1510   09/20/13 1200  fluconazole (DIFLUCAN) IVPB 100 mg  Status:  Discontinued     100 mg 50 mL/hr over 60 Minutes Intravenous Every 24 hours 09/20/13 1151 09/20/13 1154   09/10/13 2200  metroNIDAZOLE (FLAGYL) IVPB 500 mg  Status:  Discontinued     500 mg 100 mL/hr over 60 Minutes Intravenous 3 times per day 09/10/13 1802 09/22/13 1118   09/10/13 2000  vancomycin (VANCOCIN) 50 mg/mL oral solution 500 mg  Status:  Discontinued  500 mg Oral 4 times per day 09/10/13 1802 09/21/13 1151   09/10/13 1800  vancomycin (VANCOCIN) 50 mg/mL oral solution 500 mg  Status:  Discontinued     500 mg Oral 4 times per day 09/10/13 1432 09/10/13 1802   09/10/13 1600  metroNIDAZOLE (FLAGYL) IVPB 500 mg  Status:  Discontinued     500 mg 100 mL/hr over 60 Minutes Intravenous 3 times per day 09/10/13 1432 09/10/13 1802   09/09/13 1602  vancomycin (VANCOCIN) 1,250 mg in sodium chloride 0.9 % 250 mL IVPB  Status:  Discontinued     1,250 mg 166.7 mL/hr over 90 Minutes Intravenous Every 24 hours 09/09/13 1057 09/10/13 1432   09/06/13 1600  imipenem-cilastatin (PRIMAXIN) 500 mg in sodium chloride 0.9 % 100 mL IVPB  Status:  Discontinued     500 mg 200 mL/hr over 30 Minutes Intravenous 3 times per day 09/06/13 1310 09/12/13 1048   09/03/13 1100   imipenem-cilastatin (PRIMAXIN) 500 mg in sodium chloride 0.9 % 100 mL IVPB  Status:  Discontinued     500 mg 200 mL/hr over 30 Minutes Intravenous Every 6 hours 09/03/13 1007 09/06/13 1310   09/02/13 1300  vancomycin (VANCOCIN) IVPB 1000 mg/200 mL premix  Status:  Discontinued     1,000 mg 200 mL/hr over 60 Minutes Intravenous Every 12 hours 09/01/13 1527 09/07/13 1340   08/30/13 2000  vancomycin (VANCOCIN) IVPB 750 mg/150 ml premix  Status:  Discontinued     750 mg 150 mL/hr over 60 Minutes Intravenous Every 8 hours 08/30/13 1356 09/01/13 1527   08/29/13 2200  micafungin (MYCAMINE) 100 mg in sodium chloride 0.9 % 100 mL IVPB  Status:  Discontinued     100 mg 100 mL/hr over 1 Hours Intravenous Every 24 hours 08/29/13 2036 09/10/13 1432   08/29/13 0400  vancomycin (VANCOCIN) IVPB 1000 mg/200 mL premix  Status:  Discontinued     1,000 mg 200 mL/hr over 60 Minutes Intravenous Every 8 hours 08/28/13 2358 08/30/13 1356   08/29/13 0400  piperacillin-tazobactam (ZOSYN) IVPB 3.375 g  Status:  Discontinued     3.375 g 12.5 mL/hr over 240 Minutes Intravenous 3 times per day 08/28/13 2358 09/03/13 1002   08/28/13 1845  [MAR Hold]  vancomycin (VANCOCIN) IVPB 1000 mg/200 mL premix     (On MAR Hold since 08/28/13 1957)   1,000 mg 200 mL/hr over 60 Minutes Intravenous  Once 08/28/13 1831 08/28/13 1955   08/28/13 1845  [MAR Hold]  piperacillin-tazobactam (ZOSYN) IVPB 3.375 g     (On MAR Hold since 08/28/13 1957)   3.375 g 100 mL/hr over 30 Minutes Intravenous  Once 08/28/13 1831 08/28/13 2056      Assessment/Plan: s/p Procedure(s): EXPLORATORY LAPAROTOMY,debridment of nacrotic stomach,and primary repair of perforated stomach. (N/Mccormick)  perf gastric ulcer Surgery 1/8 New LUQ abscess drain placed in ir 2/21 Some better today Will follow   LOS: 46 days    James Mccormick 10/13/2013

## 2013-10-13 NOTE — Progress Notes (Signed)
NUTRITION FOLLOW UP  Intervention:   TPN per pharmacy Recommend GJ-tube when medically able for benefit of enteral feeding vs tpn; recommend initiating Vital AF 1.2 @ 10 ml/hr via J-tube. RD to follow for nutrition care plan  Nutrition Dx:   Inadequate oral intake now related to inability to eat as evidenced by NPO status, ongoing  Goal:   Pt to meet >/= 90% of their estimated nutrition needs, met  Monitor:   TPN prescription, PO diet advancement, weight, labs, I/O's  Assessment:   Patient with PMH of GERD, HTN presented with severe abdominal pain associated with nausea & vomiting; had been taking Alleve and large amount of Goody's powder.   CT scan revealed perforated stomach ulcer due to NSAID use.   Patient s/p procedures 1/8:  EXPLORATORY LAPAROTOMY  DEBRIDEMENT OF NECROTIC STOMACH  REPAIR OF PERFORATED STOMACH   Patient tested positive for C difficile 1/21. IV Flagyl & PO Vancomycin added.  Patient s/p FEES 1/23.  Demonstrated mild dysphagia.  Advanced to Clear Liquids 1/27, Full Liquids 1/28. Patient diet advanced to dysphagia 3, thin liquids 1/29.  Patient's current diet is NPO as of 1/31. 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  TPN was increased 2/19 due to weight loss. Patient continues to receive cyclic TPN with Clinimix E 5/15 + lipids over 12 hours to provide 2397 kcal, 135 gm protein daily. Meets 106% minimum estimated energy needs and 104% minimum estimated protein needs. Trace elements on MWF only d/t national shortage. Weight continues to trend down with additional 3 lb weight loss this past week. New L upper abd drain placed 2/21 by IR  Pt has drains and NGT in place. Stage 2 pressure ulcer on posterior head.  Labs reviewed. Low sodium, high phosphorus. Potassium and magnesium WNL. Elevated BUN.  Elevated triglycerides trending down, now at 155 mg/dL.   Height: 08/28/13  5' 11" (1.803 m)   Weight Status:   Wt Readings from Last 1 Encounters:  10/12/13 193 lb 2 oz (87.6 kg)  02/18  196 lb 02/06  206 lb 02/03  200 lb 01/28  215 lb 01/27  220 lb 01/26   220 lb 01/24   221 lb  01/23   222 lb  01/22   230 lb  01/21   233 lb  01/20   252 lb  01/19   261 lb  01/18   276 lb  01/17   278 lb  01/16   279 lb  01/12   262 lb   Admission weight: 225 lb  Body mass index is 26.95 kg/(m^2).   Re-estimated needs:  Kcal: 2250-2450 Protein: 130-140 gm Fluid: 2.2-2.4 L  Skin: abdominal surgical incision; 3 closed system drains; +1 generalized edema, +1 RLE and LLE edema, +1 perineal edema; stage 2 pressure ulcer on posterior head  Diet Order: NPO    Intake/Output Summary (Last 24 hours) at 10/13/13 1427 Last data filed at 10/13/13 1214  Gross per 24 hour  Intake   2600 ml  Output   2503 ml  Net     97 ml    Last BM: 2/21, diarrhea  Labs:   Recent Labs Lab 10/09/13 0555 10/11/13 0552 10/13/13 0612  NA 135* 135* 135*  K 4.5 4.8 4.7  CL 101 100 100  CO2 24 25 25    BUN 30* 28* 29*  CREATININE 0.67 0.70 0.63  CALCIUM 8.5 8.7 8.5  MG 1.8  --  1.9  PHOS 4.5  --  4.9*  GLUCOSE 111* 93 103*    CBG (last 3)   Recent Labs  10/13/13 0001 10/13/13 0706 10/13/13 1201  GLUCAP 155* 84 106*    Scheduled Meds: . sodium chloride   Intravenous Q24H  . antiseptic oral rinse  15 mL Mouth Rinse q12n4p  . chlorhexidine  15 mL Mouth Rinse BID  . enoxaparin (LOVENOX) injection  40 mg Subcutaneous Q24H  . insulin aspart  0-15 Units Subcutaneous 4 times per day  . metoprolol  7.5 mg Intravenous Q4H  . metronidazole  500 mg Intravenous Q6H  . micafungin (MYCAMINE) IV  100 mg Intravenous Daily  . pantoprazole (PROTONIX) IV  40 mg Intravenous Q12H  . sodium chloride  10-40 mL Intracatheter Q12H    Continuous Infusions: . Marland KitchenTPN (CLINIMIX-E) Adult Stopped (10/13/13  6503)   And  . fat emulsion Stopped (10/13/13 0612)  . Marland KitchenTPN (CLINIMIX-E) Adult     And  . fat emulsion      Pryor Ochoa RD, LDN Inpatient Clinical Dietitian Pager: [phone] After Hours Pager: [phone]

## 2013-10-13 NOTE — Progress Notes (Signed)
Patient ID: James Mccormick, male   DOB: 12/14/1965, 48 y.o.   MRN: [id number]         [facility]    Date of Admission:  08/28/2013    Total days of antibiotics 46        Day 35 IV metronidazole        Day 18 micafungin Principal Problem:   Intra-abdominal abscess Active Problems:   Perforated gastric ulcer   ARDS (adult respiratory distress syndrome)   Hypocalcemia   Hypophosphatemia   Protein-calorie malnutrition, moderate   Atrial fibrillation   Pleural effusion   C. difficile colitis   Depression   Cigarette smoker   . sodium chloride   Intravenous Q24H  . antiseptic oral rinse  15 mL Mouth Rinse q12n4p  . chlorhexidine  15 mL Mouth Rinse BID  . enoxaparin (LOVENOX) injection  40 mg Subcutaneous Q24H  . insulin aspart  0-15 Units Subcutaneous 4 times per day  . metoprolol  7.5 mg Intravenous Q4H  . metronidazole  500 mg Intravenous Q6H  . micafungin (MYCAMINE) IV  100 mg Intravenous Daily  . pantoprazole (PROTONIX) IV  40 mg Intravenous Q12H  . sodium chloride  10-40 mL Intracatheter Q12H    Subjective: He is feeling better. He had one small diarrheal stool this morning. He had a new left upper quadrant drain placed February 21.  Past Medical History  Diagnosis Date  . GERD (gastroesophageal reflux disease)   . Hypertension   . Depression   . Pleural effusion   . Dysrhythmia     ATRIAL FIBRILATION 08/2013    History  Substance Use Topics  . Smoking status: Current Every Day Smoker -- 1.00 packs/day    Types: Cigarettes  . Smokeless tobacco: Not on file  . Alcohol Use: No    Family History  Problem Relation Age of Onset  . Mental illness Mother   . Hypertension Sister   . Hypertension Brother     No Known Allergies  Objective: Temp:  [99 F (37.2 C)-99.8 F (37.7 C)] 99.8 F (37.7 C) (02/23 0655) Pulse Rate:  [95-98] 97 (02/23 0655) Resp:  [18-19] 19 (02/23 0655) BP: (120-138)/(84-89) 120/84 mmHg (02/23  0655) SpO2:  [100 %] 100 % (02/23 0655)  General: He is in no distress watching TV Abdomen: Soft nontender. 33 cc out of drains yesterday.   Lab Results Lab Results  Component Value Date   WBC 11.2* 10/13/2013   HGB 11.2* 10/13/2013   HCT 34.1* 10/13/2013   MCV 89.5 10/13/2013   PLT 236 10/13/2013    Lab Results  Component Value Date   CREATININE 0.63 10/13/2013   BUN 29* 10/13/2013   NA 135* 10/13/2013   K 4.7 10/13/2013   CL 100 10/13/2013   CO2 25 10/13/2013    Lab Results  Component Value Date   ALT 11 10/13/2013   AST 14 10/13/2013   ALKPHOS 87 10/13/2013   BILITOT 0.3 10/13/2013    C difficile PCR 10/04/2013: Negative  Microbiology: Recent Results (from the past 240 hour(s))  CLOSTRIDIUM DIFFICILE BY PCR     Status: None   Collection Time    10/04/13 10:11 AM      Result Value Ref Range Status   C difficile by pcr NEGATIVE  NEGATIVE Final  CULTURE, ROUTINE-ABSCESS     Status: None   Collection Time    10/11/13 12:53 PM      Result Value Ref Range Status  Specimen Description ABSCESS LEFT PERITONEAL CAVITY   Final   Special Requests NONE   Final   Gram Stain     Final   Value: MODERATE WBC PRESENT, PREDOMINANTLY PMN     NO SQUAMOUS EPITHELIAL CELLS SEEN     NO ORGANISMS SEEN     Performed at [facility]   Culture     Final   Value: NO GROWTH 2 DAYS     Performed at [facility]   Report Status PENDING   Incomplete   Assessment: Repeat peritoneal fluid abscess cultures are negative at 48 hours.  Plan: 1. Continue metronidazole and micafungin  2. I will followup once cultures are final  Cliffton Asters, MD [facility] [phone] pager   [phone] cell 10/13/2013, 11:36 AM

## 2013-10-14 ENCOUNTER — Inpatient Hospital Stay
Admission: AD | Admit: 2013-10-14 | Discharge: 2013-12-25 | Disposition: A | Discharge status: Home Health | Payer: Medicaid Other | Source: Ambulatory Visit | Attending: Internal Medicine | Admitting: Internal Medicine

## 2013-10-14 DIAGNOSIS — I4892 Unspecified atrial flutter: Secondary | ICD-10-CM

## 2013-10-14 HISTORY — DX: Other specified abnormal findings of blood chemistry: R79.89

## 2013-10-14 HISTORY — DX: Acute gastric ulcer with perforation: K25.1

## 2013-10-14 HISTORY — DX: Encephalopathy, unspecified: G93.40

## 2013-10-14 HISTORY — DX: Unspecified protein-calorie malnutrition: E46

## 2013-10-14 HISTORY — DX: Cholecystitis, unspecified: K81.9

## 2013-10-14 HISTORY — DX: Enterocolitis due to Clostridium difficile, not specified as recurrent: A04.72

## 2013-10-14 HISTORY — DX: Acute kidney failure, unspecified: N17.9

## 2013-10-14 HISTORY — DX: Heart disease, unspecified: I51.9

## 2013-10-14 HISTORY — DX: Sepsis, unspecified organism: A41.9

## 2013-10-14 HISTORY — DX: Acute respiratory failure, unspecified whether with hypoxia or hypercapnia: J96.00

## 2013-10-14 HISTORY — DX: Unspecified atrial fibrillation: I48.91

## 2013-10-14 HISTORY — DX: Anemia, unspecified: D64.9

## 2013-10-14 HISTORY — DX: Acute embolism and thrombosis of unspecified deep veins of unspecified lower extremity: I82.409

## 2013-10-14 HISTORY — DX: Other specified abnormalities of plasma proteins: R77.8

## 2013-10-14 HISTORY — DX: Reserved for concepts with insufficient information to code with codable children: IMO0002

## 2013-10-14 LAB — GLUCOSE, CAPILLARY
Glucose-Capillary: 106 mg/dL — ABNORMAL HIGH (ref 70–99)
Glucose-Capillary: 108 mg/dL — ABNORMAL HIGH (ref 70–99)
Glucose-Capillary: 154 mg/dL — ABNORMAL HIGH (ref 70–99)

## 2013-10-14 LAB — CULTURE, ROUTINE-ABSCESS: CULTURE: NO GROWTH

## 2013-10-14 MED ORDER — FAT EMULSION 20 % IV EMUL
250.0000 mL | INTRAVENOUS | Status: DC
  Administered 2013-10-14: 250 mL via INTRAVENOUS
  Filled 2013-10-14: qty 250

## 2013-10-14 MED ORDER — M.V.I. ADULT IV INJ
INTRAVENOUS | Status: DC
  Administered 2013-10-14: 18:00:00 via INTRAVENOUS
  Filled 2013-10-14: qty 2700

## 2013-10-14 NOTE — Discharge Summary (Signed)
Patient ID: James LeydenChristopher D Mccormick MRN: [id number] DOB/AGE: 50/11/1965 50 y.o.  Admit date: 08/28/2013 Discharge date: 10/14/2013  Procedures:  1. Exploratory laparotomy.  2. Debridement of necrotic stomach with scissors.  3. Primary repair of perforated stomach in 2 layers.  Consults: cardiology, pulmonary/intensive care, ID, psychiatry and vascular surgery  Reason for Admission: 48 year old Caucasian male presents to the ER late this afternoon complaining of severe worsening upper abdominal pain. He states that he had been feeling ill for the past 2-3 weeks. He been having pain more in the evening with burning in his epigastrium. He been taking prescription omeprazole without relief. Because of worsening discomfort he started taking Aleve as well as a large amount of Goody's powders. The pain acutely worsened this afternoon causing him to double over and he certainly came to the emergency department. Is associated with nausea and vomiting. He had a little bit of blood-tinged emesis. He had fever and chills in the ER. Because of that severe pain he complains of some shortness of breath. He denies any recent shortness of breath or dyspnea on exertion over the past couple weeks or months. He does smoke a pack a day. He denies any significant alcohol. He denies any family history of stomach cancer. He has never had an upper endoscopy.   Admission Diagnoses:  1. Abdominal pain/peritonitis 2. Tachycardia 3. Pneumoperitoneum 4. hypokalemia  Hospital Course:  1/8: Exploratory laparotomy, debridement of necrotic stomach with scissors, primary repair of perforated stomach in 2 layers. Septic shock, VDRF, a fib post-op.  1/9: Cardiology consulted . ECHO EF 45%  1/11: still pressor dependent, NSR for > 24 hrs. Amiodarone stopped.  1/12: remain son levophed,. Left radial a line dc'ed  09/02/13: off levophed. Encephalopathic - randomly agitated. Mmit 08/28/2013 . TPN continuesoving around, biting tube, looks  randomly, does not track. Failed SBT. Febrile. + 24 Liters. Back on neo  09/03/13: ARDS Protocol with 48h nimbex started. Left radial artery occluded - seen by VVS. Expectant mgmt.  1/15: 50% fio2 on ARDS protocol with nimbex. Left radial artery occlusion events noted.  1/17 off paralytics  1/19 Extubated  1/20 Precedex for agitation  1/20 Speech therapy swallow eval; d/c solu cortef  1/21 Diarrhea, increased WBC  1/22 Trial on BiPAP  1/24 reintubated   1. Perforated gastric ulcer: Upon admission, the patient was taken to the operating room where he underwent an exploratory laparotomy and debridement of necrotic stomach and primary repair of perforated stomach in 2 layers. The patient was critically ill and transported back to the ICU intubated. Please see above hospital dates for important ICU events.  During this time he was started on TNA. He was extubated for a second time on postoperative day 18. He did have bowel function. His diet was slowly advanced. However several days later a CT scan was ordered as the patient still had tachycardia and elevated white blood cell count. 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 He was made n.p.o. again. An NG tube was placed. Over the next several weeks, the patient remained n.p.o. and on TNA. He did have several repeat CT scans which revealed multiple different fluid collections. At various times different drains were placed and removed. Most recently he had a left subphrenic drain placed on February 21. All of his drains have grown yeast. Infectious disease has been consulted and has recommended Micofungin. At this time, duration is undetermined. He also remains on Flagyl IV indefinitely.  The patient was Clostridium difficile positive during this admission. He has since tested negative but due to continued antibiotic therapy he will remain prophylactically on Flagyl.  Postoperatively, the patient also  had a wound dehiscent. He has since granulated over this and his wound is stable with normal saline wet to dry dressing changes and an abdominal binder. On postop day 47, the patient is surgically stable with a normal white blood cell count and control of his heart rate. He is felt stable from a surgical standpoint for discharge to a long term acute care facility.  2. NSTEMI: This was found on admission. This was felt by cardiology to be secondary to demand ischemia. No further cardiac issues during his stay except for tachycardia which is being treated with IV medications. He did have an echocardiogram done on 08/29/2013 with diffuse hypokinesis with an ejection fraction of 40-45%. This was felt to be a poor study and recommended repeat study. He should followup with cardiology as an outpatient.  3. Acute kidney injury: This was mild and resolved on its with IV fluid hydration and resolution of his sepsis.  4. Acute encephalopathy: This was felt initially to be due to his sepsis. He did have ARDS postoperatively. After his final extubation, the patient's encephalopathy resolved and has not been an issue since then.  5. Depression\anxiety: The patient takes Zoloft at home. However due to history to n.p.o. status he is unable to take oral medications. Psychiatry was consulted to assist with medication management however had no IV substitute. He is recommended to restart his Zoloft daily when he can take oral medications again.  6. Anemia of critical illness: This has remained stable throughout his hospitalization.    Discharge Diagnoses:  Principal Problem:   Intra-abdominal abscess Active Problems:   Perforated gastric ulcer   ARDS (adult respiratory distress syndrome)   Hypocalcemia   Hypophosphatemia   Protein-calorie malnutrition, moderate   Atrial fibrillation   Pleural effusion   C. difficile colitis   Depression   Cigarette smoker   Discharge Medications:   Medication List     STOP taking these medications       DIOVAN 320 MG tablet  Generic drug:  valsartan     LORazepam 0.5 MG tablet  Commonly known as:  ATIVAN     omeprazole 20 MG capsule  Commonly known as:  PRILOSEC     sertraline 50 MG tablet  Commonly known as:  ZOLOFT        Discharge Instructions:     Follow-up Information   Follow up with Atilano Ina, MD. (when you get out of long term acute care facility)    Specialty:  General Surgery   Contact information:   [address] [place] [address] [phone]      the patient should remain strictly n.p.o. with an NG tube in place until it IS confirmED that his stomach has completely healed!!!!! Repeat CT scan as needed to follow up new IR drain placed a couple of days ago Do NOT remove surgical JP drains Keep abdominal binder on to help with wound dehiscence, in the meantime he should have normal saline wet to dry dressing changes done to his abdominal wound twice a day. ID has recommended Micofungin and flagyl, may need to consult them for duration of therapy as this has changed.  Signed: Eyden Dobie E 10/14/2013, 1:16 PM

## 2013-10-14 NOTE — Progress Notes (Signed)
PT Cancellation Note  Patient Details Name: Pamalee LeydenChristopher D Coppinger MRN: [id number] DOB: 04/14/1966   Cancelled Treatment:    Reason Eval/Treat Not Completed: Patient declined, no reason specified. Attempted to see patient again however patient stated that dressing still had not been looked at and addressed. Patient did not want to attempt ambulating before full dressing change. Will follow up as time allows   Robinette, Adline PotterJulia [name] 10/14/2013, 1:17 PM

## 2013-10-14 NOTE — Progress Notes (Signed)
PARENTERAL NUTRITION CONSULT NOTE - FOLLOW UP  Pharmacy Consult:  TPN Indication:  Prolonged ileus  No Known Allergies  Patient Measurements: Height: 5\' 11"  (180.3 cm) Weight: 195 lb 5.2 oz (88.6 kg) IBW/kg (Calculated) : 75.3  Vital Signs: Temp: 99.1 F (37.3 C) (02/24 0554) Temp src: Oral (02/24 0554) BP: 129/95 mmHg (02/24 0554) Pulse Rate: 113 (02/24 0554) Intake/Output from previous day: 02/23 0701 - 02/24 0700 In: 1164 [TPN:1164] Out: 2150 [Urine:1925; Emesis/NG output:200; Drains:25]  Labs:  Recent Labs  10/13/13 0612  WBC 11.2*  HGB 11.2*  HCT 34.1*  PLT 236     Recent Labs  10/13/13 0612  NA 135*  K 4.7  CL 100  CO2 25  GLUCOSE 103*  BUN 29*  CREATININE 0.63  CALCIUM 8.5  MG 1.9  PHOS 4.9*  PROT 6.8  ALBUMIN 2.4*  AST 14  ALT 11  ALKPHOS 87  BILITOT 0.3  PREALBUMIN 20.5  TRIG 155*   Estimated Creatinine Clearance: 119 ml/min (by C-G formula based on Cr of 0.63).    Recent Labs  10/13/13 2033 10/14/13 0011 10/14/13 0543  GLUCAP 135* 154* 106*     Insulin Requirements in the past 24 hours:  5 units moderate SSI + 40 units insulin in TPN  Assessment:  47 YOM presented to the ED on 08/28/13 with abdominal pain.  CT consistent with perforated stomach ulcer due to NSAID use, pneumoperitoneum, and peritonitis.  He was taken emergently to the OR where he underwent ex-lap, debridement of necrotic stomach, and repair of perforated stomach. Patient continues on TPN for nutritional support.  Aware patient did not receive lipids last night and he is to transfer to [facility] once bed is available.  GI: CT scan 1/31 poss persistent gastric perf, fluid collection b/w bladder and rectum, large stomach wall defect and fistula.  Considering GJT for benefit of enteral feeding vs tpn   He had CT guided aspiration LUQ fluid collection and right drains removed, new left subphrenic drain.  Prealbumin WNL but patient was losing weight and TPN adjusted on 2/19 to  provide additional kCal and protein.  His weight is now trending up but his prealbumin decreased slightly.  TG decreased to 155.  Abdominal drains x2.  NGT low suction (O/P improving) - PPI IV Endo: no hx DM, CBGs controlled on and off of TPN Lytes: 2/23 labs - low Na, Phos mildly elevated at 4.9, others WNL Heptobili: LFTs/tbili WNL, TG down 155 Cards: BP normal, tachy this AM Renal: SCr 0.63 (stable), CrCL 119 ml/min, good UOP ID:  Micafungin for yeast in intra-abd culture + IV Flagyl while on abx to prevent recurrence of C.diff.  CT guided asp'n of perihepatic fluid 2/13 - cultures with yeast.  Afebrile, WBC trending down  Flagyl 1/21 >>  Micafungin 1/9>>1/21; 2/6 >> Primaxin 1/14>>1/23  Vanc PO 1/21>>1/31  Fluconazole 1/31>>2/6  Cefepime 2/10 >> 2/16 Vanc 1/8>>1/21 Zosyn 1/8>>1/14; 2/14 >> 2/10  2/21 abscess L peritoneal: 2/12 intra-abd fluid: (+)yeast 2/6 peritoneal abscess: NF  2/1 peritoneal cavity: few candida glabrata  1/30 urine: NF  1/21 CDiff Positive  1/16 Trach aspirate: NF  1/9 Blood cultures: NF   TPN Access: triple lumen PICC 1/20; replaced 1/27  TPN day#: 45  Current Nutrition:  TPN  Nutritional Goals:  2250-2450 kcal and 130-140 gm protein   Plan:  - Continue Cylic Clinimix E 5/15 over 12 hours: 50 ml/hr x 1 hr, then 260 ml/hr x 10 hrs, then 50 ml/hr x1  hr - IVFE at 20 ml/hr x 12 hrs - TPN providing 2397 kCal and 135gm protein, meeting 100% of needs - Continue 1/2NS at 10-2920mL/hr ONLY when TPN is not running (0600-1800)  - Continue with 40 units regular insulin in TPN - Daily IV multivitamin in TPN - Trace elements on MWF only d/t national shortage - F/U daily    Elaisha Zahniser D. Laney Potashang, PharmD, BCPS Pager:  [phone] 10/14/2013, 9:57 AM

## 2013-10-14 NOTE — Progress Notes (Signed)
Rhian Asebedo K. Asaf Elmquist, MD, FACS [facility]  General/ Trauma Surgery  10/14/2013 2:07 PM  

## 2013-10-14 NOTE — Progress Notes (Signed)
47 Days Post-Op  Subjective: He looks great, drains with very little in them , base wound looks good.  Objective: Vital signs in last 24 hours: Temp:  [97.7 F (36.5 C)-99.2 F (37.3 C)] 99.1 F (37.3 C) (02/24 0554) Pulse Rate:  [65-113] 113 (02/24 0554) Resp:  [17-20] 17 (02/24 0554) BP: (125-137)/(59-95) 129/95 mmHg (02/24 0554) SpO2:  [98 %-100 %] 98 % (02/24 0554) Weight:  [88.6 kg (195 lb 5.2 oz)] 88.6 kg (195 lb 5.2 oz) (02/24 0500) Last BM Date: 10/13/13 Drain 1 3ml Drain 2 15 ml Drain 3  7 ml NPO on TNA;   TM 99.8, BP up some diastolic 90's on and off Labs OK Monday Last CT on 2/21; near complete resolution of perihepatic fluid, new LUQ collection, possible fistula Intake/Output from previous day: 02/23 0701 - 02/24 0700 In: 1164 [TPN:1164] Out: 2150 [Urine:1925; Emesis/NG output:200; Drains:25] Intake/Output this shift:    General appearance: alert, cooperative and no distress GI: open wound looks very good, + BS, drains with very little in them.  Lab Results:   Recent Labs  10/13/13 0612  WBC 11.2*  HGB 11.2*  HCT 34.1*  PLT 236    BMET  Recent Labs  10/13/13 0612  NA 135*  K 4.7  CL 100  CO2 25  GLUCOSE 103*  BUN 29*  CREATININE 0.63  CALCIUM 8.5   PT/INR No results found for this basename: LABPROT, INR,  in the last 72 hours   Recent Labs Lab 10/09/13 0555 10/13/13 0612  AST 15 14  ALT 13 11  ALKPHOS 89 87  BILITOT 0.3 0.3  PROT 6.7 6.8  ALBUMIN 2.3* 2.4*     Lipase     Component Value Date/Time   LIPASE 14 08/29/2013 0930     Studies/Results: No results found.  Medications: . sodium chloride   Intravenous Q24H  . antiseptic oral rinse  15 mL Mouth Rinse q12n4p  . chlorhexidine  15 mL Mouth Rinse BID  . enoxaparin (LOVENOX) injection  40 mg Subcutaneous Q24H  . insulin aspart  0-15 Units Subcutaneous 4 times per day  . metoprolol  7.5 mg Intravenous Q4H  . metronidazole  500 mg Intravenous Q6H  . micafungin  (MYCAMINE) IV  100 mg Intravenous Daily  . pantoprazole (PROTONIX) IV  40 mg Intravenous Q12H  . sodium chloride  10-40 mL Intracatheter Q12H    Assessment/Plan s/p Procedure(s):  EXPLORATORY LAPAROTOMY,debridment of nacrotic stomach,and primary repair of perforated stomach.  Perforated gastric ulcer s/p exploratory laparotomy Dr. Andrey Campanile 08/28/13  -s/p IR drain placement to RUQ x2. 2 additional surgical drains placed at time of surgery  CT scan 09/20/13 shows possible persistent gastric perforation, fluid collection 5.5 x 4.9 cm gastrosplenic ligament, and a third collection between bladder and rectum. (CT abscess drain attempted,  Guidewire easily passed into stomach through large wall defect. Drain not placed 09/21/13 by IR)  New left subphrenic drain placed yesterday and periodic fluid aspirated and removed. Some of the right-sided drains were also removed 10/12/2013. Sepsis  Post op AF with RVR/hypotension  NSTEMI from demand ischemia  Acute renal insuffincey  Post op respiratory failure/ARDS/ extubated 09/08/13  L radial artery subtotal occlusion  Encephalopathy  C diff colitis  hypersomnolence and brady arrest 09/13/13, re intubated/ extubated again 09/15/13  GERD  Tobacco use  Remote hx of ETOH use, none for 13 years  Depression on high dose Zoloft for some time.     Continue current Rx, Day 17 micafungin  and day 33 Flagyl.  hhe has been on Zosyn going back to admit on 08/28/13.  12 days of micafungin after admission and prior to this last run.  Note yesterday from Dr. Orvan Falconerampbell wrote to continue current antibiotics.  LOS: 47 days    James Mccormick 10/14/2013

## 2013-10-14 NOTE — Discharge Instructions (Signed)
PATIENT SHOULD REMAIN NPO WITH AN NGT IN PLACE UNTIL HIS STOMACH COMPLETELY HEALS  Repeat CT scan as needed to follow up new IR drain placed a couple of days ago Do NOT remove surgical JP drains Keep abdominal binder on to help with wound dehiscence ID has recommended Micofungin and flagyl, may need to consult them for duration of therapy as this has changed.

## 2013-10-14 NOTE — Progress Notes (Signed)
PT Cancellation Note  Patient Details Name: James LeydenChristopher D [name] MRN: [id number] DOB: 02/04/1966   Cancelled Treatment:    Reason Eval/Treat Not Completed: Patient declined, no reason specified. Patient waiting on dressing change at this time but agreeable to ambulate this afternoon. Will follow up   Fredrich BirksRobinette, Julia [name] 10/14/2013, 11:42 AM

## 2013-10-14 NOTE — Progress Notes (Signed)
OT Cancellation Note  Patient Details Name: James Mccormick MRN: [id number] DOB: 04/13/1966   Cancelled Treatment:    Reason Eval/Treat Not Completed:Attempted x 2 - pt with Nursing.  Will reattempt  Jeani HawkingWendi Rifka Ramey, OTR/L [phone]  10/14/2013, 11:43 AM

## 2013-10-14 NOTE — Discharge Summary (Signed)
Wilmon ArmsMatthew K. Corliss Skainssuei, MD, [facility]  General/ Trauma Surgery  10/14/2013 2:07 PM

## 2013-10-15 ENCOUNTER — Other Ambulatory Visit (HOSPITAL_COMMUNITY): Payer: Self-pay

## 2013-10-15 ENCOUNTER — Other Ambulatory Visit (HOSPITAL_COMMUNITY): Payer: Medicaid Other

## 2013-10-15 ENCOUNTER — Institutional Professional Consult (permissible substitution) (HOSPITAL_COMMUNITY): Payer: Medicaid Other

## 2013-10-15 LAB — CBC WITH DIFFERENTIAL/PLATELET
Basophils Absolute: 0 10*3/uL (ref 0.0–0.1)
Basophils Relative: 0 % (ref 0–1)
EOS ABS: 0 10*3/uL (ref 0.0–0.7)
Eosinophils Relative: 0 % (ref 0–5)
HEMATOCRIT: 35.1 % — AB (ref 39.0–52.0)
HEMOGLOBIN: 11.5 g/dL — AB (ref 13.0–17.0)
Lymphocytes Relative: 37 % (ref 12–46)
Lymphs Abs: 3.7 10*3/uL (ref 0.7–4.0)
MCH: 29.9 pg (ref 26.0–34.0)
MCHC: 32.8 g/dL (ref 30.0–36.0)
MCV: 91.2 fL (ref 78.0–100.0)
MONO ABS: 0.8 10*3/uL (ref 0.1–1.0)
MONOS PCT: 8 % (ref 3–12)
NEUTROS ABS: 5.3 10*3/uL (ref 1.7–7.7)
Neutrophils Relative %: 54 % (ref 43–77)
Platelets: 279 10*3/uL (ref 150–400)
RBC: 3.85 MIL/uL — ABNORMAL LOW (ref 4.22–5.81)
RDW: 15.4 % (ref 11.5–15.5)
WBC: 9.9 10*3/uL (ref 4.0–10.5)

## 2013-10-15 LAB — URINE MICROSCOPIC-ADD ON

## 2013-10-15 LAB — COMPREHENSIVE METABOLIC PANEL
ALT: 11 U/L (ref 0–53)
AST: 14 U/L (ref 0–37)
Albumin: 2.4 g/dL — ABNORMAL LOW (ref 3.5–5.2)
Alkaline Phosphatase: 93 U/L (ref 39–117)
BILIRUBIN TOTAL: 0.4 mg/dL (ref 0.3–1.2)
BUN: 24 mg/dL — AB (ref 6–23)
CHLORIDE: 99 meq/L (ref 96–112)
CO2: 26 mEq/L (ref 19–32)
CREATININE: 0.72 mg/dL (ref 0.50–1.35)
Calcium: 8.8 mg/dL (ref 8.4–10.5)
GFR calc non Af Amer: >90 mL/min (ref >90)
Glucose, Bld: 209 mg/dL — ABNORMAL HIGH (ref 70–99)
Potassium: 4.2 mEq/L (ref 3.7–5.3)
Sodium: 137 mEq/L (ref 137–147)
Total Protein: 7.1 g/dL (ref 6.0–8.3)

## 2013-10-15 LAB — URINALYSIS, ROUTINE W REFLEX MICROSCOPIC
BILIRUBIN URINE: NEGATIVE
Glucose, UA: NEGATIVE mg/dL
Hgb urine dipstick: NEGATIVE
Ketones, ur: NEGATIVE mg/dL
Nitrite: NEGATIVE
PROTEIN: NEGATIVE mg/dL
Specific Gravity, Urine: 1.02 (ref 1.005–1.030)
UROBILINOGEN UA: 0.2 mg/dL (ref 0.0–1.0)
pH: 5.5 (ref 5.0–8.0)

## 2013-10-15 LAB — PROTIME-INR
INR: 1.14 (ref 0.00–1.49)
Prothrombin Time: 14.4 seconds (ref 11.6–15.2)

## 2013-10-15 LAB — PREALBUMIN: Prealbumin: 20.1 mg/dL (ref 17.0–34.0)

## 2013-10-15 LAB — HEMOGLOBIN A1C
Hgb A1c MFr Bld: 5.4 % (ref <5.7)
Mean Plasma Glucose: 108 mg/dL (ref <117)

## 2013-10-15 LAB — FERRITIN: FERRITIN: 425 ng/mL — AB (ref 22–322)

## 2013-10-15 LAB — CK TOTAL AND CKMB (NOT AT ARMC)
CK, MB: 1.6 ng/mL (ref 0.3–4.0)
Relative Index: INVALID (ref 0.0–2.5)
Total CK: 14 U/L (ref 7–232)

## 2013-10-15 LAB — VITAMIN B12: VITAMIN B 12: 526 pg/mL (ref 211–911)

## 2013-10-15 LAB — PHOSPHORUS: PHOSPHORUS: 5.1 mg/dL — AB (ref 2.3–4.6)

## 2013-10-15 LAB — TSH: TSH: 4.57 u[IU]/mL — AB (ref 0.350–4.500)

## 2013-10-15 LAB — T4, FREE: Free T4: 1.22 ng/dL (ref 0.80–1.80)

## 2013-10-15 LAB — TROPONIN I: Troponin I: <0.3 ng/mL (ref <0.30)

## 2013-10-15 LAB — PROCALCITONIN: Procalcitonin: <0.1 ng/mL

## 2013-10-15 LAB — APTT: APTT: 24 s (ref 24–37)

## 2013-10-15 LAB — MAGNESIUM: MAGNESIUM: 1.8 mg/dL (ref 1.5–2.5)

## 2013-10-15 NOTE — Progress Notes (Signed)
Patient ID: James LeydenChristopher D Radford, male   DOB: 03/03/1966, 48 y.o.   MRN: [id number]         [facility]    Date of Admission:  10/14/2013    Total days of antibiotics 48         Day 37 IV metronidazole         Day 20 micafungin     Subjective: He is feeling better. He has not had any diarrhea recently.  Past Medical History  Diagnosis Date  . GERD (gastroesophageal reflux disease)   . Hypertension   . Depression   . Pleural effusion   . Dysrhythmia     ATRIAL FIBRILATION 08/2013    History  Substance Use Topics  . Smoking status: Current Every Day Smoker -- 1.00 packs/day    Types: Cigarettes  . Smokeless tobacco: Not on file  . Alcohol Use: No    Family History  Problem Relation Age of Onset  . Mental illness Mother   . Hypertension Sister   . Hypertension Brother     No Known Allergies  Objective: Temp:  [98.6 F (37 C)] 98.6 F (37 C) (02/24 1239) Pulse Rate:  [107] 107 (02/24 1239) Resp:  [16] 16 (02/24 1239) BP: (126)/(92) 126/92 mmHg (02/24 1239) SpO2:  [97 %] 97 % (02/24 1239)  General: He is in no distress watching TV Abdomen: Soft nontender.   Lab Results Lab Results  Component Value Date   WBC 9.9 10/15/2013   HGB 11.5* 10/15/2013   HCT 35.1* 10/15/2013   MCV 91.2 10/15/2013   PLT 279 10/15/2013    Lab Results  Component Value Date   CREATININE 0.72 10/15/2013   BUN 24* 10/15/2013   NA 137 10/15/2013   K 4.2 10/15/2013   CL 99 10/15/2013   CO2 26 10/15/2013    Lab Results  Component Value Date   ALT 11 10/15/2013   AST 14 10/15/2013   ALKPHOS 93 10/15/2013   BILITOT 0.4 10/15/2013    C difficile PCR 10/04/2013: Negative  Microbiology: Recent Results (from the past 240 hour(s))  CULTURE, ROUTINE-ABSCESS     Status: None   Collection Time    10/11/13 12:53 PM      Result Value Ref Range Status   Specimen Description ABSCESS LEFT PERITONEAL CAVITY   Final   Special Requests NONE   Final   Gram Stain     Final   Value: MODERATE WBC PRESENT, PREDOMINANTLY PMN     NO SQUAMOUS EPITHELIAL CELLS SEEN     NO ORGANISMS SEEN     [facility]   Culture     Final   Value: NO GROWTH 3 DAYS     [facility]formed at [facility]   Report Status 10/14/2013 FINAL   Final  BODY FLUID CULTURE     Status: None   Collection Time    10/15/13 12:08 AM      Result Value Ref Range Status   Specimen Description FLUID DRAINAGE ABDOMEN   Final   Special Requests JP DRAIN OF ABDOMEN   Final   Gram Stain     Final   Value: MODERATE WBC PRESENT, PREDOMINANTLY PMN     FEW GRAM POSITIVE COCCI     IN PAIRS IN CLUSTERS Gram Stain Report Called to,Read Back By and Verified With: Gram Stain Report Called to,Read Back By and Verified With: BOBBIE SCOTT RN 10/15/13 10:10AM BY MILSH  Performed at [facility] PENDING   Incomplete   Report Status PENDING   Incomplete   Assessment: Repeat peritoneal fluid abscess cultures from 2/21 are negative and final at 72 hours. I would continue micafungin for 2 weeks after the negative culture. I will stop metronidazole now.  Plan: 1. Continue micafungin through 10/25/2013 2. Discontinue metronidazole. 3. I will sign off now  Cliffton Asters, MD [facility] [phone] pager   [phone] cell 10/15/2013, 10:20 AM

## 2013-10-16 LAB — CBC WITH DIFFERENTIAL/PLATELET
BASOS ABS: 0 10*3/uL (ref 0.0–0.1)
Basophils Relative: 0 % (ref 0–1)
Eosinophils Absolute: 0 10*3/uL (ref 0.0–0.7)
Eosinophils Relative: 0 % (ref 0–5)
HCT: 33.3 % — ABNORMAL LOW (ref 39.0–52.0)
Hemoglobin: 11.1 g/dL — ABNORMAL LOW (ref 13.0–17.0)
Lymphocytes Relative: 32 % (ref 12–46)
Lymphs Abs: 3.2 10*3/uL (ref 0.7–4.0)
MCH: 30.2 pg (ref 26.0–34.0)
MCHC: 33.3 g/dL (ref 30.0–36.0)
MCV: 90.5 fL (ref 78.0–100.0)
Monocytes Absolute: 1.1 10*3/uL — ABNORMAL HIGH (ref 0.1–1.0)
Monocytes Relative: 11 % (ref 3–12)
NEUTROS ABS: 5.8 10*3/uL (ref 1.7–7.7)
Neutrophils Relative %: 57 % (ref 43–77)
PLATELETS: 268 10*3/uL (ref 150–400)
RBC: 3.68 MIL/uL — ABNORMAL LOW (ref 4.22–5.81)
RDW: 15.1 % (ref 11.5–15.5)
WBC: 10.2 10*3/uL (ref 4.0–10.5)

## 2013-10-16 LAB — BASIC METABOLIC PANEL
BUN: 23 mg/dL (ref 6–23)
CO2: 27 meq/L (ref 19–32)
Calcium: 8.7 mg/dL (ref 8.4–10.5)
Chloride: 100 mEq/L (ref 96–112)
Creatinine, Ser: 0.67 mg/dL (ref 0.50–1.35)
GFR calc Af Amer: >90 mL/min (ref >90)
GFR calc non Af Amer: >90 mL/min (ref >90)
Glucose, Bld: 147 mg/dL — ABNORMAL HIGH (ref 70–99)
POTASSIUM: 4 meq/L (ref 3.7–5.3)
SODIUM: 136 meq/L — AB (ref 137–147)

## 2013-10-16 LAB — URINE CULTURE: Colony Count: 3000

## 2013-10-17 LAB — BODY FLUID CULTURE

## 2013-10-17 LAB — FOLATE RBC: RBC FOLATE: 658 ng/mL — AB (ref >280)

## 2013-10-18 ENCOUNTER — Other Ambulatory Visit (HOSPITAL_COMMUNITY): Payer: Medicaid Other

## 2013-10-18 LAB — BASIC METABOLIC PANEL
BUN: 20 mg/dL (ref 6–23)
CHLORIDE: 101 meq/L (ref 96–112)
CO2: 25 meq/L (ref 19–32)
Calcium: 8.8 mg/dL (ref 8.4–10.5)
Creatinine, Ser: 0.72 mg/dL (ref 0.50–1.35)
GFR calc non Af Amer: >90 mL/min (ref >90)
Glucose, Bld: 152 mg/dL — ABNORMAL HIGH (ref 70–99)
POTASSIUM: 4.1 meq/L (ref 3.7–5.3)
Sodium: 139 mEq/L (ref 137–147)

## 2013-10-18 LAB — VANCOMYCIN, TROUGH: VANCOMYCIN TR: 11.7 ug/mL (ref 10.0–20.0)

## 2013-10-18 LAB — CBC
HEMATOCRIT: 34.3 % — AB (ref 39.0–52.0)
HEMOGLOBIN: 11.5 g/dL — AB (ref 13.0–17.0)
MCH: 30.4 pg (ref 26.0–34.0)
MCHC: 33.5 g/dL (ref 30.0–36.0)
MCV: 90.7 fL (ref 78.0–100.0)
Platelets: 307 10*3/uL (ref 150–400)
RBC: 3.78 MIL/uL — AB (ref 4.22–5.81)
RDW: 15.1 % (ref 11.5–15.5)
WBC: 11.5 10*3/uL — AB (ref 4.0–10.5)

## 2013-10-18 MED ORDER — IOHEXOL 300 MG/ML  SOLN
100.0000 mL | Freq: Once | INTRAMUSCULAR | Status: AC | PRN
  Administered 2013-10-18: 100 mL via INTRAVENOUS

## 2013-10-20 LAB — COMPREHENSIVE METABOLIC PANEL
ALT: 14 U/L (ref 0–53)
AST: 14 U/L (ref 0–37)
Albumin: 2.4 g/dL — ABNORMAL LOW (ref 3.5–5.2)
Alkaline Phosphatase: 95 U/L (ref 39–117)
BILIRUBIN TOTAL: 0.4 mg/dL (ref 0.3–1.2)
BUN: 21 mg/dL (ref 6–23)
CHLORIDE: 99 meq/L (ref 96–112)
CO2: 27 mEq/L (ref 19–32)
Calcium: 9.1 mg/dL (ref 8.4–10.5)
Creatinine, Ser: 0.71 mg/dL (ref 0.50–1.35)
GFR calc Af Amer: >90 mL/min (ref >90)
Glucose, Bld: 144 mg/dL — ABNORMAL HIGH (ref 70–99)
Potassium: 4 mEq/L (ref 3.7–5.3)
Sodium: 137 mEq/L (ref 137–147)
Total Protein: 7.4 g/dL (ref 6.0–8.3)

## 2013-10-20 LAB — CBC WITH DIFFERENTIAL/PLATELET
Basophils Absolute: 0 10*3/uL (ref 0.0–0.1)
Basophils Relative: 0 % (ref 0–1)
Eosinophils Absolute: 0.1 10*3/uL (ref 0.0–0.7)
Eosinophils Relative: 1 % (ref 0–5)
HEMATOCRIT: 35 % — AB (ref 39.0–52.0)
HEMOGLOBIN: 11.6 g/dL — AB (ref 13.0–17.0)
Lymphocytes Relative: 27 % (ref 12–46)
Lymphs Abs: 3 10*3/uL (ref 0.7–4.0)
MCH: 30 pg (ref 26.0–34.0)
MCHC: 33.1 g/dL (ref 30.0–36.0)
MCV: 90.4 fL (ref 78.0–100.0)
MONO ABS: 1.4 10*3/uL — AB (ref 0.1–1.0)
MONOS PCT: 12 % (ref 3–12)
NEUTROS ABS: 6.8 10*3/uL (ref 1.7–7.7)
Neutrophils Relative %: 60 % (ref 43–77)
Platelets: 360 10*3/uL (ref 150–400)
RBC: 3.87 MIL/uL — ABNORMAL LOW (ref 4.22–5.81)
RDW: 14.9 % (ref 11.5–15.5)
WBC: 11.3 10*3/uL — ABNORMAL HIGH (ref 4.0–10.5)

## 2013-10-20 LAB — PREALBUMIN: Prealbumin: 19.2 mg/dL (ref 17.0–34.0)

## 2013-10-21 ENCOUNTER — Other Ambulatory Visit (HOSPITAL_COMMUNITY): Payer: Medicaid Other

## 2013-10-21 LAB — URINALYSIS, ROUTINE W REFLEX MICROSCOPIC
BILIRUBIN URINE: NEGATIVE
Glucose, UA: NEGATIVE mg/dL
Ketones, ur: NEGATIVE mg/dL
LEUKOCYTES UA: NEGATIVE
NITRITE: NEGATIVE
PH: 5.5 (ref 5.0–8.0)
Protein, ur: NEGATIVE mg/dL
Specific Gravity, Urine: 1.02 (ref 1.005–1.030)
UROBILINOGEN UA: 0.2 mg/dL (ref 0.0–1.0)

## 2013-10-21 LAB — BASIC METABOLIC PANEL
BUN: 21 mg/dL (ref 6–23)
CALCIUM: 8.9 mg/dL (ref 8.4–10.5)
CHLORIDE: 98 meq/L (ref 96–112)
CO2: 27 mEq/L (ref 19–32)
Creatinine, Ser: 0.78 mg/dL (ref 0.50–1.35)
GFR calc Af Amer: >90 mL/min (ref >90)
Glucose, Bld: 139 mg/dL — ABNORMAL HIGH (ref 70–99)
POTASSIUM: 4.4 meq/L (ref 3.7–5.3)
Sodium: 136 mEq/L — ABNORMAL LOW (ref 137–147)

## 2013-10-21 LAB — URINE MICROSCOPIC-ADD ON

## 2013-10-21 LAB — CBC
HCT: 35.1 % — ABNORMAL LOW (ref 39.0–52.0)
Hemoglobin: 11.8 g/dL — ABNORMAL LOW (ref 13.0–17.0)
MCH: 30.3 pg (ref 26.0–34.0)
MCHC: 33.6 g/dL (ref 30.0–36.0)
MCV: 90.2 fL (ref 78.0–100.0)
Platelets: 357 10*3/uL (ref 150–400)
RBC: 3.89 MIL/uL — ABNORMAL LOW (ref 4.22–5.81)
RDW: 14.8 % (ref 11.5–15.5)
WBC: 14.3 10*3/uL — ABNORMAL HIGH (ref 4.0–10.5)

## 2013-10-22 LAB — CBC WITH DIFFERENTIAL/PLATELET
BASOS PCT: 0 % (ref 0–1)
Basophils Absolute: 0.1 10*3/uL (ref 0.0–0.1)
EOS ABS: 0.1 10*3/uL (ref 0.0–0.7)
EOS PCT: 1 % (ref 0–5)
HCT: 35.2 % — ABNORMAL LOW (ref 39.0–52.0)
Hemoglobin: 11.7 g/dL — ABNORMAL LOW (ref 13.0–17.0)
Lymphocytes Relative: 37 % (ref 12–46)
Lymphs Abs: 4.7 10*3/uL — ABNORMAL HIGH (ref 0.7–4.0)
MCH: 29.9 pg (ref 26.0–34.0)
MCHC: 33.2 g/dL (ref 30.0–36.0)
MCV: 90 fL (ref 78.0–100.0)
MONOS PCT: 14 % — AB (ref 3–12)
Monocytes Absolute: 1.8 10*3/uL — ABNORMAL HIGH (ref 0.1–1.0)
Neutro Abs: 6.2 10*3/uL (ref 1.7–7.7)
Neutrophils Relative %: 49 % (ref 43–77)
Platelets: 359 10*3/uL (ref 150–400)
RBC: 3.91 MIL/uL — AB (ref 4.22–5.81)
RDW: 14.7 % (ref 11.5–15.5)
WBC: 12.7 10*3/uL — ABNORMAL HIGH (ref 4.0–10.5)

## 2013-10-22 LAB — BASIC METABOLIC PANEL
BUN: 20 mg/dL (ref 6–23)
CALCIUM: 9.2 mg/dL (ref 8.4–10.5)
CO2: 26 mEq/L (ref 19–32)
CREATININE: 0.7 mg/dL (ref 0.50–1.35)
Chloride: 100 mEq/L (ref 96–112)
GFR calc Af Amer: >90 mL/min (ref >90)
Glucose, Bld: 88 mg/dL (ref 70–99)
Potassium: 4.5 mEq/L (ref 3.7–5.3)
Sodium: 138 mEq/L (ref 137–147)

## 2013-10-22 LAB — URINE CULTURE
COLONY COUNT: NO GROWTH
Culture: NO GROWTH

## 2013-10-24 ENCOUNTER — Encounter: Payer: Self-pay | Admitting: Radiology

## 2013-10-24 ENCOUNTER — Other Ambulatory Visit (HOSPITAL_COMMUNITY): Payer: Medicaid Other

## 2013-10-24 LAB — COMPREHENSIVE METABOLIC PANEL
ALBUMIN: 2.5 g/dL — AB (ref 3.5–5.2)
ALT: 20 U/L (ref 0–53)
AST: 35 U/L (ref 0–37)
Alkaline Phosphatase: 124 U/L — ABNORMAL HIGH (ref 39–117)
BUN: 26 mg/dL — AB (ref 6–23)
CO2: 28 mEq/L (ref 19–32)
CREATININE: 0.73 mg/dL (ref 0.50–1.35)
Calcium: 9.1 mg/dL (ref 8.4–10.5)
Chloride: 101 mEq/L (ref 96–112)
GFR calc Af Amer: >90 mL/min (ref >90)
GFR calc non Af Amer: >90 mL/min (ref >90)
Glucose, Bld: 128 mg/dL — ABNORMAL HIGH (ref 70–99)
Potassium: 4.8 mEq/L (ref 3.7–5.3)
Sodium: 141 mEq/L (ref 137–147)
Total Bilirubin: 0.3 mg/dL (ref 0.3–1.2)
Total Protein: 7.6 g/dL (ref 6.0–8.3)

## 2013-10-24 LAB — CBC WITH DIFFERENTIAL/PLATELET
Basophils Absolute: 0.1 10*3/uL (ref 0.0–0.1)
Basophils Relative: 0 % (ref 0–1)
EOS ABS: 0.1 10*3/uL (ref 0.0–0.7)
Eosinophils Relative: 1 % (ref 0–5)
HCT: 36 % — ABNORMAL LOW (ref 39.0–52.0)
Hemoglobin: 12.1 g/dL — ABNORMAL LOW (ref 13.0–17.0)
Lymphocytes Relative: 30 % (ref 12–46)
Lymphs Abs: 4 10*3/uL (ref 0.7–4.0)
MCH: 30.3 pg (ref 26.0–34.0)
MCHC: 33.6 g/dL (ref 30.0–36.0)
MCV: 90 fL (ref 78.0–100.0)
MONOS PCT: 12 % (ref 3–12)
Monocytes Absolute: 1.7 10*3/uL — ABNORMAL HIGH (ref 0.1–1.0)
NEUTROS PCT: 57 % (ref 43–77)
Neutro Abs: 7.6 10*3/uL (ref 1.7–7.7)
PLATELETS: 397 10*3/uL (ref 150–400)
RBC: 4 MIL/uL — ABNORMAL LOW (ref 4.22–5.81)
RDW: 14.5 % (ref 11.5–15.5)
WBC: 13.4 10*3/uL — ABNORMAL HIGH (ref 4.0–10.5)

## 2013-10-24 LAB — BASIC METABOLIC PANEL
BUN: 24 mg/dL — AB (ref 6–23)
CO2: 26 mEq/L (ref 19–32)
Calcium: 9.4 mg/dL (ref 8.4–10.5)
Chloride: 99 mEq/L (ref 96–112)
Creatinine, Ser: 0.75 mg/dL (ref 0.50–1.35)
GFR calc Af Amer: >90 mL/min (ref >90)
GFR calc non Af Amer: >90 mL/min (ref >90)
Glucose, Bld: 91 mg/dL (ref 70–99)
Potassium: 4.4 mEq/L (ref 3.7–5.3)
SODIUM: 138 meq/L (ref 137–147)

## 2013-10-24 MED ORDER — IOHEXOL 300 MG/ML  SOLN
100.0000 mL | Freq: Once | INTRAMUSCULAR | Status: AC | PRN
  Administered 2013-10-24: 100 mL via INTRAVENOUS

## 2013-10-24 NOTE — Progress Notes (Signed)
Patient ID: James Mccormick, male   DOB: 09-Mar-1966, 48 y.o.   MRN: [id number] Request received for CT guided pelvic fluid collection drainage on pt familiar to IR service from prior abscess drainage procedures. Pt is s/p gastric ulcer perforation with subsequent repair 08/28/13 and finding of multiple abdominal fluid collections on f/u CT. Pt currently has abd wound vac and 2 additional surgical drains placed in left/right portions of abd. He continues to have abd/pelvic pain, leukocytosis and latest CT demonstrates well formed suprapubic collection (6 cm) which is amenable to perc drainage. Images were reviewed by Dr. Fredia Sorrow and case d/w [facility] (Dr. Andrey Campanile). Exam: pt awake/alert; NG tube in place; chest- sl dim BS bases; heart- RRR ; abd- soft,few BS, tender pelvic regions; midline wound vac, left/right surgical drains intact; ext- no edema.  VSS;AF                             Past Medical History  Diagnosis Date  . GERD (gastroesophageal reflux disease)   . Hypertension   . Depression   . Pleural effusion   . Dysrhythmia     ATRIAL FIBRILATION 08/2013   Past Surgical History  Procedure Laterality Date  . Neck surgery    . Laparotomy N/A 08/28/2013    Procedure: EXPLORATORY LAPAROTOMY,debridment of nacrotic stomach,and primary repair of perforated stomach.;  Surgeon: Atilano Ina, MD;  Location: [facility] OR;  Service: General;  Laterality: N/A;   Ct Abdomen Pelvis W Contrast  10/24/2013   CLINICAL DATA:  Evaluate for abscess.  EXAM: CT ABDOMEN AND PELVIS WITH CONTRAST  TECHNIQUE: Multidetector CT imaging of the abdomen and pelvis was performed using the standard protocol following bolus administration of intravenous contrast.  CONTRAST:  OMNIPAQUE IOHEXOL 300 MG/ML  SOLN  COMPARISON:  10/18/2013  FINDINGS: BODY WALL: Healing ventral abdominal wound with wound VAC.  LOWER CHEST: Small left pleural effusion. No peritoneal thickening. There is bibasilar atelectasis.  ABDOMEN/PELVIS:  Liver: No focal  abnormality.  Biliary: Distended with fluid level. There is no wall thickening or pericholecystic inflammation.  Pancreas: Unremarkable.  Spleen: Unremarkable.  Adrenals: Unremarkable.  Kidneys and ureters: Nonobstructive bilateral nephrolithiasis. The 2 stones on the right are punctate. The stone in the left lower pole measures 2 mm.  Bladder: Unremarkable.  Reproductive: Unremarkable.  Bowel: Colonic diverticulosis. No bowel obstruction or ileus. Enteric tube is redundant, coiled in the stomach multiple times. The stomach is thick walled in this patient with known peptic ulcer disease and history perforated ulcer. Normal appendix.  Retroperitoneum: No mass or adenopathy.  Peritoneum: Multiple fluid collections throughout the peritoneum:  1. Left sub- diaphragmatic collection is similar in size at 9 x 6 x 3 cm. Gas around this collection suggests partial continuity with surgical drains in the upper abdomen. 2. 15 x 30 mm collection at the splenic hilum is unchanged. 3. 34 x 34 mm collection at the pigtail catheter site, which has been subsequently removed, has reaccumulated. 4. 19 x 29 mm collection containing gas ventral to the caudate lobe. This is in continuity with a right-sided surgical drain. 5. Three collections along the greater curvature of the stomach are similar in size, the largest (located most inferior and medially) measuring up to 26 mm. 6. A complex collection in the low central abdomen, extending into the interloop space, is similar in size with the dominant pocket measuring 33 x 28 mm. 7. Suprapubic collection which measures approximately 6 cm in  diameter continues to have bilateral superior extension, larger on the right. The overall volume is stable from prior. 8. Unchanged approximately 5 cm recto-prostatic recess abscess. 9. No new intraperitoneal abscesses detected. Reticulation of the upper omental fat is stable from prior and considered inflammatory. Vascular: No acute abnormality.   OSSEOUS: No acute abnormalities.  IMPRESSION: 1. Numerous peritoneal fluid collections/abscesses throughout the abdomen, cataloged above. There has been re-accumulation of fluid at the site of prior pigtail drain in the left upper quadrant. Otherwise, no significant change from 10/18/2013. 2. Stable, small left effusion which appears simple/transudative.   Electronically Signed   By: Tiburcio Pea M.D.   On: 10/24/2013 07:39   Ct Abdomen Pelvis W Contrast  10/18/2013   CLINICAL DATA:  History of perforated gastric ulcer complicated by multiple intra abdominal abscesses and fistulas, post multiple drain placement  EXAM: CT ABDOMEN AND PELVIS WITH CONTRAST  TECHNIQUE: Multidetector CT imaging of the abdomen and pelvis was performed using the standard protocol following bolus administration of intravenous contrast.  CONTRAST:  OMNIPAQUE IOHEXOL 300 MG/ML  SOLN  COMPARISON:  CT IMAGE GUIDED DRAINAGE BY PERCUTANEOUS CATHETER dated 10/11/2013; CT ABD/PELVIS W CM dated 10/10/2013; CT IMAGE GUIDED DRAINAGE PERCUT CATH PERITONEAL RETROPERIT dated 10/02/2013; CT ABD/PELVIS W CM dated 10/02/2013  FINDINGS: No new intra-abdominal fluid collections per  Two surgical drains are seen within the midline of the upper abdomen.  Interval resolution of previously noted infrasplenic fluid collection post percutaneous drainage catheter placement.  Grossly unchanged appearance of approximately 9.5 x 6.2 cm mixed air and fluid collection within left upper abdominal quadrant about the cranial aspect of the spleen, previously, 9.6 x 5.2 cm. Additional loculated fluid collection within the splenic hilum is also unchanged measuring approximately 3.1 x 1.7 cm. There are multiple scattered foci of pneumoperitoneum with the left upper abdominal quadrant (representative axial images 23 and 25, series 2), similar to the prior examination.  This grossly unchanged diffuse thickening of the stomach wall, possibly secondary to underdistention.  Enteric tube terminates within the gastric fundus. Unchanged loculated peripherally enhancing fluid collection/abscess sees about the greater curvature of the stomach with dominant collection measuring approximately 2.6 x 2.3 cm (image 45, series 2).  There is grossly extensive slightly nodular mesenteric stranding centered within the upper abdomen.  Unchanged appearance of approximately 5.2 x 3.8 cm peripherally enhancing fluid collection within the lower pelvis between the posterior wall of the urinary bladder anterior aspect of the rectum (image 93, series 2). Unchanged appearance of an additional fluid colonic collection noted about the superior aspect of the urinary bladder which measures approximately 4.8 x 6.4 cm (image 92, series 2) and is again noted to contain into serpiginous outpouchings about its cranial aspect (representative coronal image 49, series 5; axial image 88, series 2).  Ingested enteric contrast extends to the level of the rectum. No definite evidence of enteric contrast extravasation. There is apparent wall thickening involving the sigmoid colon within the left hemipelvis (image 86, series 2), likely reactive secondary to the adjacent inflammatory process within the lower pelvis and not resulting enteric obstruction. Scattered colonic diverticulosis without evidence of diverticulitis. Normal appearance of the appendix. No pneumatosis or portal venous gas.   ----------------------------------------------------------------------  Normal hepatic contour. There is a minimal amount of high-density material within the gallbladder, possibly vicarious excretion of contrast versus radiopaque sludge. There is a trace amount of perihepatic fluid. No discrete hepatic lesions. No interaction pedicular duct dilatation.  There is symmetric enhancement and excretion of  bilateral kidneys. There are 2 discrete punctate opacities within in the inferior pole of the right kidney (images 54 and 55). No discrete  renal lesions. No urinary obstruction or perinephric stranding. Normal appearance of the bilateral adrenal glands and pancreas.  Normal caliber the abdominal aorta. The major branch vessels of the abdominal aorta appear patent on this non CTA examination.  Limited visualization of the lower thorax demonstrates a trace left-sided effusion. Grossly unchanged bibasilar dependent opacities, likely atelectasis. No new focal airspace opacities. Normal heart size. No pericardial effusion. The tip of a central venous catheter terminates within the superior cavoatrial junction.  No acute or aggressive osseus abnormalities. There is straightening of the expected lumbar lordosis. Moderate multilevel lumbar spine DDD, worse at L4-L5 and L5-S1 with disc space height loss, endplate irregularity and sclerosis.  Open midline skin incision with associated wound VAC.  IMPRESSION: 1. Interval resolution of infra splenic abscess post percutaneous drainage catheter placement. No new intra-abdominal fluid collections. 2. Unchanged appearance of residual approximately 9.5 cm abscess about the cranial aspect of the spleen, the approximately 5.2 cm abscess within the lower pelvis and the approximately 6.4 cm abscess above the urinary bladder. 3. Unchanged smaller (sub 3 cm) loculated fluid collections / abscesses within the splenic hilum and about the greater curvature of the stomach. 4. Presumed reactive wall thickening involving the distal sigmoid colon not resulting in enteric obstruction. No definite evidence of enteric contrast extravasation. 5. Grossly unchanged scattered foci of pneumoperitoneum within the left upper abdominal quadrant, a nonspecific finding in the setting of multiple surgical and percutaneous drainage catheters. 6. Bilateral nonobstructing nephrolithiasis.   Electronically Signed   By: Simonne Come M.D.   On: 10/18/2013 14:50   Ct Abdomen Pelvis W Contrast  10/10/2013   CLINICAL DATA:  48 year old male with  history of perforated gastric ulcer status post surgical repair. Re-evaluate for persistent gastric leak or intra-abdominal abscess.  EXAM: CT ABDOMEN AND PELVIS WITH CONTRAST  TECHNIQUE: Multidetector CT imaging of the abdomen and pelvis was performed using the standard protocol following bolus administration of intravenous contrast.  CONTRAST:  OMNIPAQUE IOHEXOL 300 MG/ML  SOLN  COMPARISON:  CT of the abdomen and pelvis 10/02/2013.  FINDINGS: Lung Bases: Central venous catheter tip terminating at the superior cavoatrial junction. Nasogastric tube with tip terminating at the gastroesophageal junction. Minimal dependent subsegmental atelectasis in the lower lobes of the lungs bilaterally. Trace left pleural effusion.  Abdomen/Pelvis: Open midline abdominal wound healing via secondary intention. There is a focal area of gastric wall thinning along the proximal lesser curvature of the stomach, at this site of prior perforation and primary repair. Immediately cephalad to this there is a potential tract, and adjacent locule of gas (image 21 of series 2), which could be indicative of slight breakdown at the site of surgical repair. Several other adjacent locules of pneumoperitoneum are noted in the left upper quadrant. These findings are similar to the prior examination 10/02/2013, although the amount of pneumoperitoneum in this region appears slightly decreased, which may suggest that if there is breakdown of the surgical repair, that it has walled off in the interim. There is a 9.6 x 5.1 x 3.0 cm thick walled rim enhancing gas and fluid collection superior to the spleen, which appears increased in size compared to the prior study, concerning for an abscess. This continues to contain a small amount of high attenuation material which could represent blood products or residual oral contrast material from a prior examination, however, the  amount of high attenuation material has decreased compared to prior studies.  Several smaller well-defined low-attenuation fluid collections are again noted adjacent to the greater curvature of the stomach and in the region of the gastrosplenic ligament, largest of which is adjacent to the tail of the pancreas measuring 4.7 cm in diameter (image 33 of series 2), possibly small seromas and/or pancreatic pseudocysts.  Two surgical drains are noted in the epigastric region, predominantly surrounded by omental fat, which demonstrates extensive fat stranding and nodularity. There are also 2 pigtail drainage catheters adjacent to the liver, 1 inferior to the gallbladder fossa, with the other located posteriorly adjacent to segment 7 of the liver. Although there is a trace amount of perihepatic fluid and several tiny locules of gas adjacent to the right lobe of the liver, no well-defined abscess is noted in this region at this time.  There continue to be several collections of rim enhancing fluid in the lower abdomen and pelvis, however, these appear generally stable in volume compared to the prior study. No pathologic distention of small bowel. Numerous reactive sized lymph nodes are noted throughout the small bowel mesentery, omentum and retroperitoneum. Prostate gland and urinary bladder are unremarkable in appearance.  Musculoskeletal: There are no aggressive appearing lytic or blastic lesions noted in the visualized portions of the skeleton.  IMPRESSION: 1. Near complete resolution of perihepatic gas and fluid collection compared to the prior study, with 2 right-sided pigtail drainage catheters in place on today's examination. 2. Increasing rim enhancing gas and fluid collection in the left upper quadrant superior to the spleen, concerning for an enlarging abscess. 3. Several persistent locules of pneumoperitoneum in the upper abdomen, predominantly in the left upper quadrant. There is a suggestion of a fistulous tract arising from the proximal lesser curvature of the stomach at this site of  the surgical repair, extending toward the left upper quadrant gas collections. This could suggest breakdown of these surgical repair, however, this overall appears slightly improved compared to the prior study, which may suggest that if there is a breakdown of the surgical repair, that it may have walled off in the interim. Continued attention on followup studies is recommended. 4. Stable volume of rim enhancing fluid collections in the lower abdomen and pelvis compared to the prior study. 5. Additional incidental findings, as above.   Electronically Signed   By: Trudie Reed M.D.   On: 10/10/2013 11:45   Ct Abdomen Pelvis W Contrast  10/02/2013   CLINICAL DATA:  Intra-abdominal fluid collections.  EXAM: CT ABDOMEN AND PELVIS WITH CONTRAST  TECHNIQUE: Multidetector CT imaging of the abdomen and pelvis was performed using the standard protocol following bolus administration of intravenous contrast.  CONTRAST:  OMNIPAQUE IOHEXOL 300 MG/ML  SOLN  COMPARISON:  CT IMAGE GUIDED DRAINAGE BY PERCUTANEOUS CATHETER dated 09/26/2013; CT ABD/PELVIS W CM dated 09/25/2013; DG NASO G TUBE PLC W/FL W/RAD dated 09/21/2013  FINDINGS: Trace left pleural effusion. Moderate atelectasis in the right lower lobe. Emphysema noted with a collection a bulla in the lingula. There is a density in the distal esophagus which could reflect the tip of a nasogastric tube -consider chest radiography.  Considerable ascites below the right hemidiaphragm and above the liver. Again noted is a gaping bowl are wound with 2 drainage tubes in during the upper abdomen and coiling in the vicinity of the upper omentum, with scattered collections of gas and fluid in this vicinity. For the most part this gas is in scattered thin walled  collections. There is more extraluminal gas in the left upper quadrant and was previously present. Complex perisplenic fluid is similar to prior. A relatively thin walled collection of fluid in the splenic hilum measures 5.4  x 4.6 cm on image 36 of series 2 (formerly 5.5 x 4.2 cm by my measurements). There is both an infiltrative and fluid density nodular component of the omentum.  Several small fluid collections are present along the inferior margin of the stomach. An index collection which previously measured 1.7 x 2.3 cm currently measures 1.8 x 3.0 cm, and a second index collection previously measuring 1.9 x 3.2 cm currently measures 2.2 x 3.4 cm.  Pancreas, adrenal glands, and right kidney unremarkable. There is a 2 mm left kidney lower pole nonobstructive calculus.  There appears to be oral contrast medium in the collection of fluid and gas above the spleen. Scrolling through images 40 through 60 of series [id number], I am struck by the possibility of a continued gastric perforation along the proximal portion of the lesser curvature, with fistula or contained perforation connecting to this left upper quadrant collection which includes orally administered contrast medium. There was previously oral contrast in this vicinity, and accordingly some of this may be residual, but I suspect that this probably represents continued contained gastric perforation  A right upper quadrant drain is present. This is successfully drain the fluid collection previously tracking along the right pericolic gutter, but could does not appear to have effectively drained the fluid collection above the liver, which may be loculated. Mild gallbladder wall thickening.  Several small extraluminal collections of fluid are present tracking along loops of bowel, in the left pericolic gutter, along the right anterior peritoneal margin, and in the pelvis. The extraluminal collections in the pelvis demonstrate only minimal internal gas density, less than prior.  Appendix unremarkable. Spondylosis and degenerative disc disease at L4-5 and L5-S1 with suspected left foraminal stenosis at both of these levels. Bony prominence along the right medial femoral neck may be from  an osteochondroma or deformity from an old fracture.  Sigmoid diverticulosis.  IMPRESSION: 1. The right upper quadrant drain has successfully trend the fluid collection along the inferior right hepatic lobe margin and pericolic gutter, but has not drained the fluid collection along the dome of the liver. 2. Defect in the proximal lesser curvature of the stomach wall, with suspected active/current perforation in this vicinity and a collection of gas and fluid extending into the left upper quadrant above the spleen where there is also oral contrast medium suspicious for continued extravasation from the stomach. 3. Mildly increased extraluminal gas in the left upper quadrant. Scattered infiltrative gas and fluid throughout the omentum, with pockets of fluid scattered in the abdomen and pelvis. Of note, the drained fluid collection along the inferior right hepatic lobe is nearly resolved. However, the loculated collection above the right hepatic lobe is still present. 4. Left foraminal stenosis at L4-5 and L5-S1 due to spondylosis. 5. Bony prominence along the right medial femoral neck may be from osteochondroma or deformity related to an old fracture. 6. Trace left pleural effusion with moderate atelectasis in the right lower lobe. 7. Emphysema.   Electronically Signed   By: Herbie Baltimore M.D.   On: 10/02/2013 08:00   Ct Abdomen Pelvis W Contrast  09/25/2013   CLINICAL DATA:  Perforated viscus, post gastric repair  EXAM: CT ABDOMEN AND PELVIS WITH CONTRAST  TECHNIQUE: Multidetector CT imaging of the abdomen and pelvis was performed using the  standard protocol following bolus administration of intravenous contrast. Sagittal and coronal MPR images reconstructed from axial data set.  CONTRAST:  80mL OMNIPAQUE IOHEXOL 300 MG/ML SOLN. Dilute oral contrast.  COMPARISON:  09/20/2013  FINDINGS: Persistent partial right lower lobe atelectasis.  Nasogastric tube extends into stomach.  Two percutaneous surgical drains in  upper abdomen.  Persistent stranding of upper abdominal fat adjacent to drains with scattered free air, unchanged.  Liver, spleen, pancreas, kidneys, and adrenal glands normal appearance.  Scattered upper abdominal ascites.  Loculated pockets of fluid are seen in the abdomen and pelvis, sterile versus infected.  Left upper quadrant collection 5.5 x 4.0 cm image 36 (previously 5.5 x 4.9 cm).  Collection inferior to stomach 3.2 by 1.9 cm image 46 unchanged.  Smaller left upper quadrant collection 2.3 x 1.7 cm image 43,) previously 2.4 x 1.9 cm).  Triangular collection in right pelvis again identified, 3.3 x 4.4 cm image 82, previously 3.0 x 4.1 cm.  Additional loculated collection in cul de sac containing foci of gas measures 5.3 x 3.7 cm, stable.  Loculated fluid collection superior to the bladder is smaller than on the previous exam, 6.7 x 5.1 cm image 93 previously 12.3 x 5.0 cm.  Open ventral wound upper abdomen.  No bowel wall thickening or evidence of obstruction.  Normal appendix.  Stomach decompressed with scattered wall thickening.  Other than distal colonic diverticulosis, bowel loops unremarkable.  No mass, adenopathy or hernia.  No acute osseous findings.  IMPRESSION: Multiple fluid collections throughout the abdomen and pelvis, ranging from stable to mildly decreased in sizes versus previous study.  No definite new or enlarging collections identified.  Persistent inflammatory changes in left upper quadrant with persistent gastric wall thickening, upper abdominal fat infiltration and foci of gas as well as surgical drains.  No new significant intra-abdominal or intrapelvic findings.   Electronically Signed   By: Ulyses Southward M.D.   On: 09/25/2013 14:20   Dg Chest Port 1 View  10/21/2013   CLINICAL DATA:  Dyspnea cough and congestion  EXAM: PORTABLE CHEST - 1 VIEW  COMPARISON:  DG CHEST 1V PORT dated 10/15/2013  FINDINGS: The hypoinflation is greater today than on the previous study. There are coarse  infrahilar lung markings medially on the right consistent with atelectasis. There is no pleural effusion. The cardiac silhouette is top-normal in size. The pulmonary vascularity is not engorged. The PICC line tip lies in the region of the distal SVC and appears stable.  The esophagogastric tube has been withdrawn such that the proximal port lies at approximately C7 and the tip lies at approximately T4.  IMPRESSION: 1. There is bilateral hypoinflation. Lip right basilar atelectasis or infiltrate is present. 2. There is no evidence of CHF nor pleural effusion. 3. Repositioning of the esophagogastric tube is needed. It has been withdrawn such that its tip is at approximately the T4 level. 4. These results were called by telephone at the time of interpretation on 10/21/2013 at 12:12 PM to Janie Morning, RN,who verbally acknowledged these results.   Electronically Signed   By: David  [place]   On: 10/21/2013 12:15   Dg Chest Port 1 View  10/15/2013   CLINICAL DATA:  PICC line.  EXAM: PORTABLE CHEST - 1 VIEW  COMPARISON:  DG CHEST 1V PORT dated 10/15/2013; DG CHEST 1V PORT dated 09/15/2013  FINDINGS: NG tube and bilateral PICC lines in good anatomic position. Previously identified left PICC line with tip in the right internal jugular vein  has been readjusted, its tip is now at the cavoatrial junction as is the right PICC line tip. Right base subsegmental atelectasis. No infiltrate. No pleural effusion or pneumothorax. Borderline cardiomegaly, no CHF. No acute bony abnormality.  IMPRESSION: 1. Bilateral PICC lines in good anatomic position. 2. Right base subsegmental atelectasis.   Electronically Signed   By: Maisie Fus  Register   On: 10/15/2013 16:49   Dg Chest Port 1 View  10/15/2013   CLINICAL DATA:  ARDS  EXAM: PORTABLE CHEST - 1 VIEW  COMPARISON:  CT ASPIRATION dated 09/21/2013; DG CHEST 1V PORT dated 09/15/2013  FINDINGS: Left upper extremity PICC is in place. Tip is in the right internal jugular vein.  NG tube tip is  beyond the gastroesophageal junction.  Endotracheal tube removed.  Bibasilar atelectasis stable.  IMPRESSION: Extubated.  Malpositioned left upper extremity PICC with its tip in the right internal jugular vein.  Bibasilar atelectasis is stable.   Electronically Signed   By: Maryclare Bean M.D.   On: 10/15/2013 08:12   Dg Abd Portable 1v  10/21/2013   CLINICAL DATA:  Nasogastric tube placement.  EXAM: PORTABLE ABDOMEN - 1 VIEW  COMPARISON:  CT of the abdomen and pelvis 10/18/2013.  FINDINGS: Nasogastric tube is coiled in the stomach. Surgical drains are seen projecting over the mid to upper abdomen bilaterally. Small amount of oral contrast material scattered throughout the colon. No pathologic distention of small bowel.  IMPRESSION: 1. Tip of nasogastric tube is in the stomach.   Electronically Signed   By: Trudie Reed M.D.   On: 10/21/2013 13:22   Ct Image Guided Drainage By Percutaneous Catheter  10/11/2013   Art A Hoss, MD     10/11/2013 12:32 PM (No note.)  Ct Image Guided Drainage By Percutaneous Catheter  09/26/2013   CLINICAL DATA:  History of gastric perforation and intra-abdominal fluid collections.  EXAM: CT GUIDED DRAINAGE OF RIGHT LATERAL ABDOMINAL FLUID COLLECTION  ANESTHESIA/SEDATION: 1.0 mg IV Versed 50 mcg IV Fentanyl. A radiology nurse monitored the patient for moderate sedation.  Total Moderate Sedation Time:  9 minutes  PROCEDURE: Informed consent was obtained for a CT-guided drainage. Patient was placed supine on the CT scanner. Images through the upper abdomen were obtained. The right side of the abdomen was prepped and draped in sterile fashion. The skin was anesthetized with 1% lidocaine. An 18 gauge needle was directed into the fluid collection and thick yellow fluid was aspirated. A stiff Amplatz wire was placed. The tract was dilated and a 10 [place] multipurpose drain was a advanced into the collection. Approximately 30 mL of thick yellow fluid was removed. The catheter was sutured to  the skin and attached to a suction bulb. A sterile dressing was placed over the catheter site.  COMPLICATIONS: None  FINDINGS: Fluid collection along the right lateral abdomen and perihepatic space. Again noted is diffuse thickening in the anterior abdomen and omentum with scattered pockets of gas. Again noted is a fluid collection in the left upper quadrant.  IMPRESSION: CT-guided drainage of the right lateral abdominal fluid collection.   Electronically Signed   By: Richarda Overlie M.D.   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 He had an open surgical repair which was complicated by multifocal intra-abdominal fluid collections. The CT scan performed earlier today demonstrates  improvement of the majority of the collections save for the large right perihepatic fluid collection. Percutaneous aspiration will be performed, and if the fluid appears infected then drain placement would be indicated.  EXAM: CT IMAGE GUIDED DRAINAGE PERCUT CATH  PERITONEAL RETROPERIT  Date: 10/02/2013  TECHNIQUE: Informed consent was obtained from the patient following explanation of the procedure, risks, benefits and alternatives. The patient understands, agrees and consents for the procedure. All questions were addressed. A time out was performed.  Maximal barrier sterile technique utilized including caps, mask, sterile gowns, sterile gloves, large sterile drape, Raatz hygiene, and Betadine skin prep. A planning axial CT scan was performed the right perihepatic fluid collection was identified. A suitable skin entry sites below the pleura was selected and marked. Local anesthesia was attained by infiltration with 1% lidocaine. A small dermatotomy was made. Using CT fluoroscopic guidance, an 18 gauge trocar needle was advanced over rib and into the perihepatic  fluid. Aspiration confirmed opaque, tan colored fluid concerning for infection. Therefore, a short Amplatz wire was advanced in a superolateral direction up over the liver dome. The skin tract was then dilated to 12 [place] and a Cook 12 [place] all-purpose drainage catheter advanced over the wire and formed. Approximately 320 mL of opaque, tan colored fluid was then aspirated. A sample was submitted for culture. Post aspiration CT imaging demonstrates near-total resolution of the perihepatic fluid collection and no evidence of immediate complication. The drainage catheter was secured to the skin with 0 Prolene suture and bumper. The tube was flushed and connected to JP bulb suction.  There was no immediate complication, the patient tolerated the procedure well.  ANESTHESIA/SEDATION: Moderate (conscious) sedation was used. Two mg Versed, 100 mcg Fentanyl were administered intravenously. The patient's vital signs were monitored continuously by radiology nursing throughout the procedure.  Sedation Time: 19 minutes  PROCEDURE: 1. CT-guided aspiration of perihepatic fluid collection 2. Placement of a 12 French percutaneous drain Interventional Radiologist:  Sterling Big, MD  IMPRESSION: 1. Aspiration of perihepatic collection yields opaque, tan colored fluid concerning for potential infection. 2. Placement of a 80 French percutaneous drain with aspiration of 320 mL of opaque, tan colored fluid. A sample was sent for culture. Tube will be left to JP bulb suction. Recommend daily flushing and record output. Once output has minimized (less than 20 mL daily over the flushed volume) the tube could be removed. Recommend repeat CT imaging prior to tube removal.  Signed,  Sterling Big, MD  [facility]  [facility]   Electronically Signed   By: Malachy Moan M.D.   On: 10/02/2013 14:53  Results for orders placed during the hospital encounter of 10/14/13  BODY FLUID  CULTURE      Result Value Ref Range   Specimen Description FLUID DRAINAGE ABDOMEN     Special Requests JP DRAIN OF ABDOMEN     Gram Stain       Value: MODERATE WBC PRESENT, PREDOMINANTLY PMN     FEW GRAM POSITIVE COCCI     IN PAIRS IN CLUSTERS Gram Stain Report Called to,Read Back By and Verified With: Gram Stain Report Called to,Read Back By and Verified With: BOBBIE SCOTT RN 10/15/13 10:10AM BY MILSH     Performed at [facility]   Culture       Value: MODERATE STAPHYLOCOCCUS AUREUS     Note: RIFAMPIN AND GENTAMICIN SHOULD NOT BE USED AS SINGLE DRUGS FOR TREATMENT OF STAPH INFECTIONS.  Performed at [facility]   Report Status 10/17/2013 FINAL     Organism ID, Bacteria STAPHYLOCOCCUS AUREUS    URINE CULTURE      Result Value Ref Range   Specimen Description URINE, RANDOM     Special Requests NONE     Culture  Setup Time       Value: 10/15/2013 08:19     Performed at [facility] Count       Value: 3,000 COLONIES/ML     Performed at [facility]   Culture       Value: INSIGNIFICANT GROWTH     Performed at [facility]   Report Status 10/16/2013 FINAL    URINE CULTURE      Result Value Ref Range   Specimen Description URINE, RANDOM     Special Requests NONE     Culture  Setup Time       Value: 10/21/2013 16:20     Performed at [facility] Count       Value: NO GROWTH     Performed at [facility]   Culture       Value: NO GROWTH     Performed at [facility]   Report Status 10/22/2013 FINAL    CBC WITH DIFFERENTIAL      Result Value Ref Range   WBC 9.9  4.0 - 10.5 K/uL   RBC 3.85 (*) 4.22 - 5.81 MIL/uL   Hemoglobin 11.5 (*) 13.0 - 17.0 g/dL   HCT 16.1 (*) 09.6 - 04.5 %   MCV 91.2  78.0 - 100.0 fL   MCH 29.9  26.0 - 34.0 pg   MCHC 32.8  30.0 - 36.0 g/dL   RDW 40.9  81.1 - 91.4 %   Platelets 279  150 - 400 K/uL   Neutrophils Relative % 54  43 - 77 %   Neutro Abs 5.3  1.7 - 7.7  K/uL   Lymphocytes Relative 37  12 - 46 %   Lymphs Abs 3.7  0.7 - 4.0 K/uL   Monocytes Relative 8  3 - 12 %   Monocytes Absolute 0.8  0.1 - 1.0 K/uL   Eosinophils Relative 0  0 - 5 %   Eosinophils Absolute 0.0  0.0 - 0.7 K/uL   Basophils Relative 0  0 - 1 %   Basophils Absolute 0.0  0.0 - 0.1 K/uL  COMPREHENSIVE METABOLIC PANEL      Result Value Ref Range   Sodium 137  137 - 147 mEq/L   Potassium 4.2  3.7 - 5.3 mEq/L   Chloride 99  96 - 112 mEq/L   CO2 26  19 - 32 mEq/L   Glucose, Bld 209 (*) 70 - 99 mg/dL   BUN 24 (*) 6 - 23 mg/dL   Creatinine, Ser 7.82  0.50 - 1.35 mg/dL   Calcium 8.8  8.4 - 95.6 mg/dL   Total Protein 7.1  6.0 - 8.3 g/dL   Albumin 2.4 (*) 3.5 - 5.2 g/dL   AST 14  0 - 37 U/L   ALT 11  0 - 53 U/L   Alkaline Phosphatase 93  39 - 117 U/L   Total Bilirubin 0.4  0.3 - 1.2 mg/dL   GFR calc non Af Amer >90  >90 mL/min   GFR calc Af Amer >90  >90 mL/min  MAGNESIUM      Result Value Ref Range  Magnesium 1.8  1.5 - 2.5 mg/dL  PHOSPHORUS      Result Value Ref Range   Phosphorus 5.1 (*) 2.3 - 4.6 mg/dL  PROTIME-INR      Result Value Ref Range   Prothrombin Time 14.4  11.6 - 15.2 seconds   INR 1.14  0.00 - 1.49  APTT      Result Value Ref Range   aPTT 24  24 - 37 seconds  PREALBUMIN      Result Value Ref Range   Prealbumin 20.1  17.0 - 34.0 mg/dL  HEMOGLOBIN [id number]      Result Value Ref Range   Hemoglobin A1C 5.4  <5.7 %   Mean Plasma Glucose 108  <117 mg/dL  TSH      Result Value Ref Range   TSH 4.570 (*) 0.350 - 4.500 uIU/mL  T4, FREE      Result Value Ref Range   Free T4 1.22  0.80 - 1.80 ng/dL  VITAMIN [id number]      Result Value Ref Range   Vitamin B-12 526  211 - 911 pg/mL  FOLATE RBC      Result Value Ref Range   RBC Folate 658 (*) >280 ng/mL  FERRITIN      Result Value Ref Range   Ferritin 425 (*) 22 - 322 ng/mL  TROPONIN I      Result Value Ref Range   Troponin I <0.30  <0.30 ng/mL  CK TOTAL AND CKMB      Result Value Ref Range   Total CK 14  7  - 232 U/L   CK, MB 1.6  0.3 - 4.0 ng/mL   Relative Index RELATIVE INDEX IS INVALID  0.0 - 2.5  URINALYSIS, ROUTINE W REFLEX MICROSCOPIC      Result Value Ref Range   Color, Urine AMBER (*) YELLOW   APPearance CLEAR  CLEAR   Specific Gravity, Urine 1.020  1.005 - 1.030   pH 5.5  5.0 - 8.0   Glucose, UA NEGATIVE  NEGATIVE mg/dL   Hgb urine dipstick NEGATIVE  NEGATIVE   Bilirubin Urine NEGATIVE  NEGATIVE   Ketones, ur NEGATIVE  NEGATIVE mg/dL   Protein, ur NEGATIVE  NEGATIVE mg/dL   Urobilinogen, UA 0.2  0.0 - 1.0 mg/dL   Nitrite NEGATIVE  NEGATIVE   Leukocytes, UA SMALL (*) NEGATIVE  PROCALCITONIN      Result Value Ref Range   Procalcitonin <0.10    URINE MICROSCOPIC-ADD ON      Result Value Ref Range   Squamous Epithelial / LPF RARE  RARE   WBC, UA 3-6  <3 WBC/hpf   Bacteria, UA RARE  RARE  CBC WITH DIFFERENTIAL      Result Value Ref Range   WBC 10.2  4.0 - 10.5 K/uL   RBC 3.68 (*) 4.22 - 5.81 MIL/uL   Hemoglobin 11.1 (*) 13.0 - 17.0 g/dL   HCT 04.533.3 (*) 40.939.0 - 81.152.0 %   MCV 90.5  78.0 - 100.0 fL   MCH 30.2  26.0 - 34.0 pg   MCHC 33.3  30.0 - 36.0 g/dL   RDW 91.415.1  78.211.5 - 95.615.5 %   Platelets 268  150 - 400 K/uL   Neutrophils Relative % 57  43 - 77 %   Neutro Abs 5.8  1.7 - 7.7 K/uL   Lymphocytes Relative 32  12 - 46 %   Lymphs Abs 3.2  0.7 - 4.0 K/uL   Monocytes Relative 11  3 -  12 %   Monocytes Absolute 1.1 (*) 0.1 - 1.0 K/uL   Eosinophils Relative 0  0 - 5 %   Eosinophils Absolute 0.0  0.0 - 0.7 K/uL   Basophils Relative 0  0 - 1 %   Basophils Absolute 0.0  0.0 - 0.1 K/uL  BASIC METABOLIC PANEL      Result Value Ref Range   Sodium 136 (*) 137 - 147 mEq/L   Potassium 4.0  3.7 - 5.3 mEq/L   Chloride 100  96 - 112 mEq/L   CO2 27  19 - 32 mEq/L   Glucose, Bld 147 (*) 70 - 99 mg/dL   BUN 23  6 - 23 mg/dL   Creatinine, Ser 6.96  0.50 - 1.35 mg/dL   Calcium 8.7  8.4 - 29.5 mg/dL   GFR calc non Af Amer >90  >90 mL/min   GFR calc Af Amer >90  >90 mL/min  VANCOMYCIN,  TROUGH      Result Value Ref Range   Vancomycin Tr 11.7  10.0 - 20.0 ug/mL  CBC      Result Value Ref Range   WBC 11.5 (*) 4.0 - 10.5 K/uL   RBC 3.78 (*) 4.22 - 5.81 MIL/uL   Hemoglobin 11.5 (*) 13.0 - 17.0 g/dL   HCT 28.4 (*) 13.2 - 44.0 %   MCV 90.7  78.0 - 100.0 fL   MCH 30.4  26.0 - 34.0 pg   MCHC 33.5  30.0 - 36.0 g/dL   RDW 10.2  72.5 - 36.6 %   Platelets 307  150 - 400 K/uL  BASIC METABOLIC PANEL      Result Value Ref Range   Sodium 139  137 - 147 mEq/L   Potassium 4.1  3.7 - 5.3 mEq/L   Chloride 101  96 - 112 mEq/L   CO2 25  19 - 32 mEq/L   Glucose, Bld 152 (*) 70 - 99 mg/dL   BUN 20  6 - 23 mg/dL   Creatinine, Ser 4.40  0.50 - 1.35 mg/dL   Calcium 8.8  8.4 - 34.7 mg/dL   GFR calc non Af Amer >90  >90 mL/min   GFR calc Af Amer >90  >90 mL/min  CBC WITH DIFFERENTIAL      Result Value Ref Range   WBC 11.3 (*) 4.0 - 10.5 K/uL   RBC 3.87 (*) 4.22 - 5.81 MIL/uL   Hemoglobin 11.6 (*) 13.0 - 17.0 g/dL   HCT 42.5 (*) 95.6 - 38.7 %   MCV 90.4  78.0 - 100.0 fL   MCH 30.0  26.0 - 34.0 pg   MCHC 33.1  30.0 - 36.0 g/dL   RDW 56.4  33.2 - 95.1 %   Platelets 360  150 - 400 K/uL   Neutrophils Relative % 60  43 - 77 %   Neutro Abs 6.8  1.7 - 7.7 K/uL   Lymphocytes Relative 27  12 - 46 %   Lymphs Abs 3.0  0.7 - 4.0 K/uL   Monocytes Relative 12  3 - 12 %   Monocytes Absolute 1.4 (*) 0.1 - 1.0 K/uL   Eosinophils Relative 1  0 - 5 %   Eosinophils Absolute 0.1  0.0 - 0.7 K/uL   Basophils Relative 0  0 - 1 %   Basophils Absolute 0.0  0.0 - 0.1 K/uL  COMPREHENSIVE METABOLIC PANEL      Result Value Ref Range   Sodium 137  137 - 147 mEq/L  Potassium 4.0  3.7 - 5.3 mEq/L   Chloride 99  96 - 112 mEq/L   CO2 27  19 - 32 mEq/L   Glucose, Bld 144 (*) 70 - 99 mg/dL   BUN 21  6 - 23 mg/dL   Creatinine, Ser 1.61  0.50 - 1.35 mg/dL   Calcium 9.1  8.4 - 09.6 mg/dL   Total Protein 7.4  6.0 - 8.3 g/dL   Albumin 2.4 (*) 3.5 - 5.2 g/dL   AST 14  0 - 37 U/L   ALT 14  0 - 53 U/L    Alkaline Phosphatase 95  39 - 117 U/L   Total Bilirubin 0.4  0.3 - 1.2 mg/dL   GFR calc non Af Amer >90  >90 mL/min   GFR calc Af Amer >90  >90 mL/min  PREALBUMIN      Result Value Ref Range   Prealbumin 19.2  17.0 - 34.0 mg/dL  CBC      Result Value Ref Range   WBC 14.3 (*) 4.0 - 10.5 K/uL   RBC 3.89 (*) 4.22 - 5.81 MIL/uL   Hemoglobin 11.8 (*) 13.0 - 17.0 g/dL   HCT 04.5 (*) 40.9 - 81.1 %   MCV 90.2  78.0 - 100.0 fL   MCH 30.3  26.0 - 34.0 pg   MCHC 33.6  30.0 - 36.0 g/dL   RDW 91.4  78.2 - 95.6 %   Platelets 357  150 - 400 K/uL  BASIC METABOLIC PANEL      Result Value Ref Range   Sodium 136 (*) 137 - 147 mEq/L   Potassium 4.4  3.7 - 5.3 mEq/L   Chloride 98  96 - 112 mEq/L   CO2 27  19 - 32 mEq/L   Glucose, Bld 139 (*) 70 - 99 mg/dL   BUN 21  6 - 23 mg/dL   Creatinine, Ser 2.13  0.50 - 1.35 mg/dL   Calcium 8.9  8.4 - 08.6 mg/dL   GFR calc non Af Amer >90  >90 mL/min   GFR calc Af Amer >90  >90 mL/min  URINALYSIS, ROUTINE W REFLEX MICROSCOPIC      Result Value Ref Range   Color, Urine YELLOW  YELLOW   APPearance CLEAR  CLEAR   Specific Gravity, Urine 1.020  1.005 - 1.030   pH 5.5  5.0 - 8.0   Glucose, UA NEGATIVE  NEGATIVE mg/dL   Hgb urine dipstick TRACE (*) NEGATIVE   Bilirubin Urine NEGATIVE  NEGATIVE   Ketones, ur NEGATIVE  NEGATIVE mg/dL   Protein, ur NEGATIVE  NEGATIVE mg/dL   Urobilinogen, UA 0.2  0.0 - 1.0 mg/dL   Nitrite NEGATIVE  NEGATIVE   Leukocytes, UA NEGATIVE  NEGATIVE  URINE MICROSCOPIC-ADD ON      Result Value Ref Range   Squamous Epithelial / LPF RARE  RARE   WBC, UA 0-2  <3 WBC/hpf   RBC / HPF 0-2  <3 RBC/hpf   Bacteria, UA FEW (*) RARE  CBC WITH DIFFERENTIAL      Result Value Ref Range   WBC 12.7 (*) 4.0 - 10.5 K/uL   RBC 3.91 (*) 4.22 - 5.81 MIL/uL   Hemoglobin 11.7 (*) 13.0 - 17.0 g/dL   HCT 57.8 (*) 46.9 - 62.9 %   MCV 90.0  78.0 - 100.0 fL   MCH 29.9  26.0 - 34.0 pg   MCHC 33.2  30.0 - 36.0 g/dL   RDW 52.8  41.3 - 24.4 %  Platelets  359  150 - 400 K/uL   Neutrophils Relative % 49  43 - 77 %   Neutro Abs 6.2  1.7 - 7.7 K/uL   Lymphocytes Relative 37  12 - 46 %   Lymphs Abs 4.7 (*) 0.7 - 4.0 K/uL   Monocytes Relative 14 (*) 3 - 12 %   Monocytes Absolute 1.8 (*) 0.1 - 1.0 K/uL   Eosinophils Relative 1  0 - 5 %   Eosinophils Absolute 0.1  0.0 - 0.7 K/uL   Basophils Relative 0  0 - 1 %   Basophils Absolute 0.1  0.0 - 0.1 K/uL  BASIC METABOLIC PANEL      Result Value Ref Range   Sodium 138  137 - 147 mEq/L   Potassium 4.5  3.7 - 5.3 mEq/L   Chloride 100  96 - 112 mEq/L   CO2 26  19 - 32 mEq/L   Glucose, Bld 88  70 - 99 mg/dL   BUN 20  6 - 23 mg/dL   Creatinine, Ser 8.11  0.50 - 1.35 mg/dL   Calcium 9.2  8.4 - 91.4 mg/dL   GFR calc non Af Amer >90  >90 mL/min   GFR calc Af Amer >90  >90 mL/min  CBC WITH DIFFERENTIAL      Result Value Ref Range   WBC 13.4 (*) 4.0 - 10.5 K/uL   RBC 4.00 (*) 4.22 - 5.81 MIL/uL   Hemoglobin 12.1 (*) 13.0 - 17.0 g/dL   HCT 78.2 (*) 95.6 - 21.3 %   MCV 90.0  78.0 - 100.0 fL   MCH 30.3  26.0 - 34.0 pg   MCHC 33.6  30.0 - 36.0 g/dL   RDW 08.6  57.8 - 46.9 %   Platelets 397  150 - 400 K/uL   Neutrophils Relative % 57  43 - 77 %   Neutro Abs 7.6  1.7 - 7.7 K/uL   Lymphocytes Relative 30  12 - 46 %   Lymphs Abs 4.0  0.7 - 4.0 K/uL   Monocytes Relative 12  3 - 12 %   Monocytes Absolute 1.7 (*) 0.1 - 1.0 K/uL   Eosinophils Relative 1  0 - 5 %   Eosinophils Absolute 0.1  0.0 - 0.7 K/uL   Basophils Relative 0  0 - 1 %   Basophils Absolute 0.1  0.0 - 0.1 K/uL  BASIC METABOLIC PANEL      Result Value Ref Range   Sodium 138  137 - 147 mEq/L   Potassium 4.4  3.7 - 5.3 mEq/L   Chloride 99  96 - 112 mEq/L   CO2 26  19 - 32 mEq/L   Glucose, Bld 91  70 - 99 mg/dL   BUN 24 (*) 6 - 23 mg/dL   Creatinine, Ser 6.29  0.50 - 1.35 mg/dL   Calcium 9.4  8.4 - 52.8 mg/dL   GFR calc non Af Amer >90  >90 mL/min   GFR calc Af Amer >90  >90 mL/min  COMPREHENSIVE METABOLIC PANEL      Result Value  Ref Range   Sodium 141  137 - 147 mEq/L   Potassium 4.8  3.7 - 5.3 mEq/L   Chloride 101  96 - 112 mEq/L   CO2 28  19 - 32 mEq/L   Glucose, Bld 128 (*) 70 - 99 mg/dL   BUN 26 (*) 6 - 23 mg/dL   Creatinine, Ser 4.13  0.50 - 1.35 mg/dL  Calcium 9.1  8.4 - 10.5 mg/dL   Total Protein 7.6  6.0 - 8.3 g/dL   Albumin 2.5 (*) 3.5 - 5.2 g/dL   AST 35  0 - 37 U/L   ALT 20  0 - 53 U/L   Alkaline Phosphatase 124 (*) 39 - 117 U/L   Total Bilirubin 0.3  0.3 - 1.2 mg/dL   GFR calc non Af Amer >90  >90 mL/min   GFR calc Af Amer >90  >90 mL/min   A/P: Pt with hx gastric ulcer perf repair 08/2013 and subsequent development of multiple abd fluid collections with prior surgical/IR drainage. Latest CT reveals well formed 6 cm suprapubic collection amenable to drainage. Plan is for CT guided drainage of this collection on 3/9. [facility] aware of plans. Details/risks of procedure d/w pt/family with their understanding and consent.

## 2013-10-25 LAB — CBC WITH DIFFERENTIAL/PLATELET
Basophils Absolute: 0.1 10*3/uL (ref 0.0–0.1)
Basophils Relative: 0 % (ref 0–1)
EOS ABS: 0.1 10*3/uL (ref 0.0–0.7)
Eosinophils Relative: 1 % (ref 0–5)
HCT: 35.9 % — ABNORMAL LOW (ref 39.0–52.0)
HEMOGLOBIN: 11.9 g/dL — AB (ref 13.0–17.0)
LYMPHS PCT: 34 % (ref 12–46)
Lymphs Abs: 4.7 10*3/uL — ABNORMAL HIGH (ref 0.7–4.0)
MCH: 29.9 pg (ref 26.0–34.0)
MCHC: 33.1 g/dL (ref 30.0–36.0)
MCV: 90.2 fL (ref 78.0–100.0)
MONOS PCT: 12 % (ref 3–12)
Monocytes Absolute: 1.6 10*3/uL — ABNORMAL HIGH (ref 0.1–1.0)
Neutro Abs: 7.4 10*3/uL (ref 1.7–7.7)
Neutrophils Relative %: 53 % (ref 43–77)
Platelets: 413 10*3/uL — ABNORMAL HIGH (ref 150–400)
RBC: 3.98 MIL/uL — AB (ref 4.22–5.81)
RDW: 14.4 % (ref 11.5–15.5)
WBC: 13.9 10*3/uL — AB (ref 4.0–10.5)

## 2013-10-26 LAB — CBC WITH DIFFERENTIAL/PLATELET
BASOS PCT: 0 % (ref 0–1)
Basophils Absolute: 0.1 10*3/uL (ref 0.0–0.1)
EOS ABS: 0.1 10*3/uL (ref 0.0–0.7)
EOS PCT: 1 % (ref 0–5)
HEMATOCRIT: 35.9 % — AB (ref 39.0–52.0)
Hemoglobin: 11.7 g/dL — ABNORMAL LOW (ref 13.0–17.0)
LYMPHS ABS: 3.5 10*3/uL (ref 0.7–4.0)
Lymphocytes Relative: 31 % (ref 12–46)
MCH: 29.5 pg (ref 26.0–34.0)
MCHC: 32.6 g/dL (ref 30.0–36.0)
MCV: 90.4 fL (ref 78.0–100.0)
MONO ABS: 1.4 10*3/uL — AB (ref 0.1–1.0)
Monocytes Relative: 13 % — ABNORMAL HIGH (ref 3–12)
NEUTROS PCT: 55 % (ref 43–77)
Neutro Abs: 6.2 10*3/uL (ref 1.7–7.7)
Platelets: 362 10*3/uL (ref 150–400)
RBC: 3.97 MIL/uL — AB (ref 4.22–5.81)
RDW: 14.3 % (ref 11.5–15.5)
WBC: 11.3 10*3/uL — ABNORMAL HIGH (ref 4.0–10.5)

## 2013-10-26 LAB — BASIC METABOLIC PANEL
BUN: 24 mg/dL — AB (ref 6–23)
CO2: 24 meq/L (ref 19–32)
Calcium: 9.3 mg/dL (ref 8.4–10.5)
Chloride: 101 mEq/L (ref 96–112)
Creatinine, Ser: 0.8 mg/dL (ref 0.50–1.35)
GFR calc Af Amer: >90 mL/min (ref >90)
GLUCOSE: 122 mg/dL — AB (ref 70–99)
POTASSIUM: 4.3 meq/L (ref 3.7–5.3)
Sodium: 138 mEq/L (ref 137–147)

## 2013-10-27 ENCOUNTER — Other Ambulatory Visit (HOSPITAL_COMMUNITY): Payer: Medicaid Other

## 2013-10-27 LAB — COMPREHENSIVE METABOLIC PANEL
ALK PHOS: 125 U/L — AB (ref 39–117)
ALT: 16 U/L (ref 0–53)
AST: 15 U/L (ref 0–37)
Albumin: 2.6 g/dL — ABNORMAL LOW (ref 3.5–5.2)
BILIRUBIN TOTAL: 0.3 mg/dL (ref 0.3–1.2)
BUN: 25 mg/dL — AB (ref 6–23)
CHLORIDE: 101 meq/L (ref 96–112)
CO2: 28 mEq/L (ref 19–32)
CREATININE: 0.76 mg/dL (ref 0.50–1.35)
Calcium: 9.3 mg/dL (ref 8.4–10.5)
GFR calc Af Amer: >90 mL/min (ref >90)
GFR calc non Af Amer: >90 mL/min (ref >90)
Glucose, Bld: 92 mg/dL (ref 70–99)
Potassium: 4.2 mEq/L (ref 3.7–5.3)
Sodium: 141 mEq/L (ref 137–147)
TOTAL PROTEIN: 7.4 g/dL (ref 6.0–8.3)

## 2013-10-27 LAB — CBC WITH DIFFERENTIAL/PLATELET
BASOS PCT: 0 % (ref 0–1)
Basophils Absolute: 0 10*3/uL (ref 0.0–0.1)
EOS ABS: 0.1 10*3/uL (ref 0.0–0.7)
EOS PCT: 1 % (ref 0–5)
HEMATOCRIT: 35.5 % — AB (ref 39.0–52.0)
Hemoglobin: 11.6 g/dL — ABNORMAL LOW (ref 13.0–17.0)
Lymphocytes Relative: 32 % (ref 12–46)
Lymphs Abs: 4.3 10*3/uL — ABNORMAL HIGH (ref 0.7–4.0)
MCH: 29.4 pg (ref 26.0–34.0)
MCHC: 32.7 g/dL (ref 30.0–36.0)
MCV: 90.1 fL (ref 78.0–100.0)
MONO ABS: 1.5 10*3/uL — AB (ref 0.1–1.0)
Monocytes Relative: 11 % (ref 3–12)
NEUTROS ABS: 7.5 10*3/uL (ref 1.7–7.7)
Neutrophils Relative %: 56 % (ref 43–77)
Platelets: 387 10*3/uL (ref 150–400)
RBC: 3.94 MIL/uL — ABNORMAL LOW (ref 4.22–5.81)
RDW: 14.3 % (ref 11.5–15.5)
WBC: 13.4 10*3/uL — ABNORMAL HIGH (ref 4.0–10.5)

## 2013-10-27 LAB — PROTIME-INR
INR: 1.19 (ref 0.00–1.49)
Prothrombin Time: 14.8 seconds (ref 11.6–15.2)

## 2013-10-27 LAB — APTT: aPTT: 31 seconds (ref 24–37)

## 2013-10-27 LAB — PREALBUMIN: PREALBUMIN: 21 mg/dL (ref 17.0–34.0)

## 2013-10-27 MED ORDER — FENTANYL CITRATE 0.05 MG/ML IJ SOLN
INTRAMUSCULAR | Status: AC | PRN
  Administered 2013-10-27 (×4): 50 ug via INTRAVENOUS

## 2013-10-27 MED ORDER — MIDAZOLAM HCL 2 MG/2ML IJ SOLN
INTRAMUSCULAR | Status: AC | PRN
  Administered 2013-10-27: 2 mg via INTRAVENOUS
  Administered 2013-10-27 (×2): 1 mg via INTRAVENOUS

## 2013-10-27 MED ORDER — FENTANYL CITRATE 0.05 MG/ML IJ SOLN
INTRAMUSCULAR | Status: AC
  Filled 2013-10-27: qty 4

## 2013-10-27 MED ORDER — MIDAZOLAM HCL 2 MG/2ML IJ SOLN
INTRAMUSCULAR | Status: AC
  Filled 2013-10-27: qty 4

## 2013-10-27 NOTE — Procedures (Signed)
Lower abd abscess drain 12 Fr Pus No comp

## 2013-10-27 NOTE — Sedation Documentation (Signed)
Patient denies pain and is resting comfortably.  

## 2013-10-29 ENCOUNTER — Other Ambulatory Visit (HOSPITAL_COMMUNITY): Payer: Self-pay

## 2013-10-29 ENCOUNTER — Other Ambulatory Visit (HOSPITAL_COMMUNITY): Payer: Medicaid Other

## 2013-10-29 LAB — BASIC METABOLIC PANEL
BUN: 23 mg/dL (ref 6–23)
CHLORIDE: 98 meq/L (ref 96–112)
CO2: 26 mEq/L (ref 19–32)
Calcium: 9.2 mg/dL (ref 8.4–10.5)
Creatinine, Ser: 0.73 mg/dL (ref 0.50–1.35)
GFR calc Af Amer: >90 mL/min (ref >90)
Glucose, Bld: 247 mg/dL — ABNORMAL HIGH (ref 70–99)
POTASSIUM: 4.4 meq/L (ref 3.7–5.3)
Sodium: 135 mEq/L — ABNORMAL LOW (ref 137–147)

## 2013-10-29 LAB — CBC WITH DIFFERENTIAL/PLATELET
BASOS ABS: 0.1 10*3/uL (ref 0.0–0.1)
BASOS PCT: 0 % (ref 0–1)
EOS ABS: 0.1 10*3/uL (ref 0.0–0.7)
Eosinophils Relative: 1 % (ref 0–5)
HCT: 35.1 % — ABNORMAL LOW (ref 39.0–52.0)
Hemoglobin: 11.5 g/dL — ABNORMAL LOW (ref 13.0–17.0)
Lymphocytes Relative: 24 % (ref 12–46)
Lymphs Abs: 3.4 10*3/uL (ref 0.7–4.0)
MCH: 29.6 pg (ref 26.0–34.0)
MCHC: 32.8 g/dL (ref 30.0–36.0)
MCV: 90.2 fL (ref 78.0–100.0)
Monocytes Absolute: 1.6 10*3/uL — ABNORMAL HIGH (ref 0.1–1.0)
Monocytes Relative: 11 % (ref 3–12)
NEUTROS ABS: 9 10*3/uL — AB (ref 1.7–7.7)
NEUTROS PCT: 63 % (ref 43–77)
Platelets: 353 10*3/uL (ref 150–400)
RBC: 3.89 MIL/uL — ABNORMAL LOW (ref 4.22–5.81)
RDW: 14.4 % (ref 11.5–15.5)
WBC: 14.2 10*3/uL — ABNORMAL HIGH (ref 4.0–10.5)

## 2013-10-29 LAB — URINALYSIS, ROUTINE W REFLEX MICROSCOPIC
BILIRUBIN URINE: NEGATIVE
Glucose, UA: NEGATIVE mg/dL
HGB URINE DIPSTICK: NEGATIVE
Ketones, ur: NEGATIVE mg/dL
Nitrite: NEGATIVE
PH: 5.5 (ref 5.0–8.0)
Protein, ur: NEGATIVE mg/dL
Specific Gravity, Urine: 1.018 (ref 1.005–1.030)
UROBILINOGEN UA: 0.2 mg/dL (ref 0.0–1.0)

## 2013-10-29 LAB — URINE MICROSCOPIC-ADD ON

## 2013-10-30 ENCOUNTER — Other Ambulatory Visit (HOSPITAL_COMMUNITY): Payer: Self-pay

## 2013-10-30 LAB — CBC WITH DIFFERENTIAL/PLATELET
Basophils Absolute: 0.1 10*3/uL (ref 0.0–0.1)
Basophils Relative: 0 % (ref 0–1)
Eosinophils Absolute: 0.1 10*3/uL (ref 0.0–0.7)
Eosinophils Relative: 1 % (ref 0–5)
HEMATOCRIT: 35.1 % — AB (ref 39.0–52.0)
HEMOGLOBIN: 11.7 g/dL — AB (ref 13.0–17.0)
LYMPHS ABS: 4 10*3/uL (ref 0.7–4.0)
LYMPHS PCT: 29 % (ref 12–46)
MCH: 29.9 pg (ref 26.0–34.0)
MCHC: 33.3 g/dL (ref 30.0–36.0)
MCV: 89.8 fL (ref 78.0–100.0)
MONO ABS: 1.6 10*3/uL — AB (ref 0.1–1.0)
Monocytes Relative: 12 % (ref 3–12)
Neutro Abs: 8.2 10*3/uL — ABNORMAL HIGH (ref 1.7–7.7)
Neutrophils Relative %: 59 % (ref 43–77)
PLATELETS: 357 10*3/uL (ref 150–400)
RBC: 3.91 MIL/uL — AB (ref 4.22–5.81)
RDW: 14.4 % (ref 11.5–15.5)
WBC: 13.9 10*3/uL — AB (ref 4.0–10.5)

## 2013-10-30 LAB — BASIC METABOLIC PANEL
BUN: 23 mg/dL (ref 6–23)
CALCIUM: 9.2 mg/dL (ref 8.4–10.5)
CO2: 26 meq/L (ref 19–32)
Chloride: 98 mEq/L (ref 96–112)
Creatinine, Ser: 0.73 mg/dL (ref 0.50–1.35)
GFR calc Af Amer: >90 mL/min (ref >90)
GFR calc non Af Amer: >90 mL/min (ref >90)
Glucose, Bld: 111 mg/dL — ABNORMAL HIGH (ref 70–99)
Potassium: 4 mEq/L (ref 3.7–5.3)
SODIUM: 137 meq/L (ref 137–147)

## 2013-10-30 LAB — URINE CULTURE
COLONY COUNT: NO GROWTH
Culture: NO GROWTH

## 2013-10-30 LAB — PROCALCITONIN: Procalcitonin: 0.15 ng/mL

## 2013-10-31 LAB — CULTURE, ROUTINE-ABSCESS: CULTURE: NO GROWTH

## 2013-10-31 LAB — BASIC METABOLIC PANEL
BUN: 22 mg/dL (ref 6–23)
CO2: 26 mEq/L (ref 19–32)
CREATININE: 0.68 mg/dL (ref 0.50–1.35)
Calcium: 9 mg/dL (ref 8.4–10.5)
Chloride: 101 mEq/L (ref 96–112)
GFR calc non Af Amer: >90 mL/min (ref >90)
Glucose, Bld: 82 mg/dL (ref 70–99)
POTASSIUM: 4.1 meq/L (ref 3.7–5.3)
Sodium: 137 mEq/L (ref 137–147)

## 2013-10-31 LAB — PROTIME-INR
INR: 1.14 (ref 0.00–1.49)
PROTHROMBIN TIME: 14.4 s (ref 11.6–15.2)

## 2013-10-31 LAB — CBC
HCT: 34.3 % — ABNORMAL LOW (ref 39.0–52.0)
Hemoglobin: 11.6 g/dL — ABNORMAL LOW (ref 13.0–17.0)
MCH: 30.1 pg (ref 26.0–34.0)
MCHC: 33.8 g/dL (ref 30.0–36.0)
MCV: 88.9 fL (ref 78.0–100.0)
Platelets: 366 10*3/uL (ref 150–400)
RBC: 3.86 MIL/uL — ABNORMAL LOW (ref 4.22–5.81)
RDW: 14.3 % (ref 11.5–15.5)
WBC: 14.6 10*3/uL — AB (ref 4.0–10.5)

## 2013-11-01 LAB — CBC
HCT: 32.4 % — ABNORMAL LOW (ref 39.0–52.0)
Hemoglobin: 10.7 g/dL — ABNORMAL LOW (ref 13.0–17.0)
MCH: 29.6 pg (ref 26.0–34.0)
MCHC: 33 g/dL (ref 30.0–36.0)
MCV: 89.5 fL (ref 78.0–100.0)
PLATELETS: 344 10*3/uL (ref 150–400)
RBC: 3.62 MIL/uL — AB (ref 4.22–5.81)
RDW: 14.3 % (ref 11.5–15.5)
WBC: 11.7 10*3/uL — AB (ref 4.0–10.5)

## 2013-11-01 LAB — BASIC METABOLIC PANEL
BUN: 22 mg/dL (ref 6–23)
CO2: 27 meq/L (ref 19–32)
Calcium: 9.1 mg/dL (ref 8.4–10.5)
Chloride: 101 mEq/L (ref 96–112)
Creatinine, Ser: 0.68 mg/dL (ref 0.50–1.35)
GFR calc Af Amer: >90 mL/min (ref >90)
GFR calc non Af Amer: >90 mL/min (ref >90)
GLUCOSE: 139 mg/dL — AB (ref 70–99)
POTASSIUM: 3.9 meq/L (ref 3.7–5.3)
SODIUM: 139 meq/L (ref 137–147)

## 2013-11-01 LAB — PROTIME-INR
INR: 1.25 (ref 0.00–1.49)
PROTHROMBIN TIME: 15.4 s — AB (ref 11.6–15.2)

## 2013-11-02 LAB — BASIC METABOLIC PANEL
BUN: 21 mg/dL (ref 6–23)
CALCIUM: 8.9 mg/dL (ref 8.4–10.5)
CO2: 26 mEq/L (ref 19–32)
Chloride: 98 mEq/L (ref 96–112)
Creatinine, Ser: 0.67 mg/dL (ref 0.50–1.35)
Glucose, Bld: 118 mg/dL — ABNORMAL HIGH (ref 70–99)
Potassium: 3.9 mEq/L (ref 3.7–5.3)
SODIUM: 137 meq/L (ref 137–147)

## 2013-11-02 LAB — CBC
HCT: 32.6 % — ABNORMAL LOW (ref 39.0–52.0)
Hemoglobin: 11 g/dL — ABNORMAL LOW (ref 13.0–17.0)
MCH: 30.1 pg (ref 26.0–34.0)
MCHC: 33.7 g/dL (ref 30.0–36.0)
MCV: 89.1 fL (ref 78.0–100.0)
Platelets: 331 10*3/uL (ref 150–400)
RBC: 3.66 MIL/uL — ABNORMAL LOW (ref 4.22–5.81)
RDW: 14.4 % (ref 11.5–15.5)
WBC: 10.8 10*3/uL — ABNORMAL HIGH (ref 4.0–10.5)

## 2013-11-02 LAB — PROTIME-INR
INR: 1.16 (ref 0.00–1.49)
PROTHROMBIN TIME: 14.6 s (ref 11.6–15.2)

## 2013-11-03 ENCOUNTER — Other Ambulatory Visit (HOSPITAL_COMMUNITY): Payer: Medicaid Other

## 2013-11-03 LAB — COMPREHENSIVE METABOLIC PANEL
ALT: 11 U/L (ref 0–53)
AST: 13 U/L (ref 0–37)
Albumin: 2.8 g/dL — ABNORMAL LOW (ref 3.5–5.2)
Alkaline Phosphatase: 127 U/L — ABNORMAL HIGH (ref 39–117)
BUN: 22 mg/dL (ref 6–23)
CO2: 27 meq/L (ref 19–32)
Calcium: 9.5 mg/dL (ref 8.4–10.5)
Chloride: 102 mEq/L (ref 96–112)
Creatinine, Ser: 0.71 mg/dL (ref 0.50–1.35)
GLUCOSE: 82 mg/dL (ref 70–99)
Potassium: 4.6 mEq/L (ref 3.7–5.3)
SODIUM: 140 meq/L (ref 137–147)
TOTAL PROTEIN: 7.7 g/dL (ref 6.0–8.3)
Total Bilirubin: 0.3 mg/dL (ref 0.3–1.2)

## 2013-11-03 LAB — CBC WITH DIFFERENTIAL/PLATELET
BASOS ABS: 0.1 10*3/uL (ref 0.0–0.1)
BASOS PCT: 1 % (ref 0–1)
Eosinophils Absolute: 0.2 10*3/uL (ref 0.0–0.7)
Eosinophils Relative: 1 % (ref 0–5)
HCT: 35.8 % — ABNORMAL LOW (ref 39.0–52.0)
Hemoglobin: 11.9 g/dL — ABNORMAL LOW (ref 13.0–17.0)
Lymphocytes Relative: 36 % (ref 12–46)
Lymphs Abs: 4.4 10*3/uL — ABNORMAL HIGH (ref 0.7–4.0)
MCH: 29.8 pg (ref 26.0–34.0)
MCHC: 33.2 g/dL (ref 30.0–36.0)
MCV: 89.7 fL (ref 78.0–100.0)
Monocytes Absolute: 1.3 10*3/uL — ABNORMAL HIGH (ref 0.1–1.0)
Monocytes Relative: 11 % (ref 3–12)
NEUTROS ABS: 6.2 10*3/uL (ref 1.7–7.7)
NEUTROS PCT: 51 % (ref 43–77)
PLATELETS: 376 10*3/uL (ref 150–400)
RBC: 3.99 MIL/uL — ABNORMAL LOW (ref 4.22–5.81)
RDW: 14.2 % (ref 11.5–15.5)
WBC: 12.1 10*3/uL — ABNORMAL HIGH (ref 4.0–10.5)

## 2013-11-03 LAB — MAGNESIUM: MAGNESIUM: 1.9 mg/dL (ref 1.5–2.5)

## 2013-11-03 LAB — PHOSPHORUS: Phosphorus: 6.3 mg/dL — ABNORMAL HIGH (ref 2.3–4.6)

## 2013-11-03 MED ORDER — IOHEXOL 300 MG/ML  SOLN
100.0000 mL | Freq: Once | INTRAMUSCULAR | Status: AC | PRN
  Administered 2013-11-03: 100 mL via INTRAVENOUS

## 2013-11-04 DIAGNOSIS — I82409 Acute embolism and thrombosis of unspecified deep veins of unspecified lower extremity: Secondary | ICD-10-CM

## 2013-11-04 LAB — CULTURE, BLOOD (ROUTINE X 2)
Culture: NO GROWTH
Culture: NO GROWTH

## 2013-11-04 NOTE — Progress Notes (Signed)
*  PRELIMINARY RESULTS* Vascular Ultrasound Limited right upper extremity venous duplex has been completed. Re-evaluation of right IJ clot.  Preliminary findings: The right internal jugular and right subclavian veins were evaluated. 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 No DVT of the right subclavian.     Farrel DemarkJill Eunice, RDMS, RVT  11/04/2013, 9:13 AM

## 2013-11-05 ENCOUNTER — Other Ambulatory Visit (HOSPITAL_COMMUNITY): Payer: Medicaid Other

## 2013-11-05 LAB — BASIC METABOLIC PANEL
BUN: 22 mg/dL (ref 6–23)
CALCIUM: 9.4 mg/dL (ref 8.4–10.5)
CO2: 27 mEq/L (ref 19–32)
Chloride: 99 mEq/L (ref 96–112)
Creatinine, Ser: 0.72 mg/dL (ref 0.50–1.35)
GFR calc non Af Amer: >90 mL/min (ref >90)
Glucose, Bld: 88 mg/dL (ref 70–99)
Potassium: 4.5 mEq/L (ref 3.7–5.3)
SODIUM: 137 meq/L (ref 137–147)

## 2013-11-05 LAB — MAGNESIUM: MAGNESIUM: 1.9 mg/dL (ref 1.5–2.5)

## 2013-11-05 LAB — CBC
HCT: 34 % — ABNORMAL LOW (ref 39.0–52.0)
Hemoglobin: 11.3 g/dL — ABNORMAL LOW (ref 13.0–17.0)
MCH: 29.5 pg (ref 26.0–34.0)
MCHC: 33.2 g/dL (ref 30.0–36.0)
MCV: 88.8 fL (ref 78.0–100.0)
PLATELETS: 356 10*3/uL (ref 150–400)
RBC: 3.83 MIL/uL — AB (ref 4.22–5.81)
RDW: 14.3 % (ref 11.5–15.5)
WBC: 10 10*3/uL (ref 4.0–10.5)

## 2013-11-05 LAB — PHOSPHORUS: Phosphorus: 5.6 mg/dL — ABNORMAL HIGH (ref 2.3–4.6)

## 2013-11-05 MED ORDER — IOHEXOL 300 MG/ML  SOLN
50.0000 mL | Freq: Once | INTRAMUSCULAR | Status: AC | PRN
  Administered 2013-11-05: 1 mL via ORAL

## 2013-11-05 NOTE — Procedures (Signed)
Abd abscess injection injection Find - No fistula Int - drain removed No comp

## 2013-11-06 ENCOUNTER — Telehealth (INDEPENDENT_AMBULATORY_CARE_PROVIDER_SITE_OTHER): Payer: Self-pay | Admitting: General Surgery

## 2013-11-06 ENCOUNTER — Other Ambulatory Visit (HOSPITAL_COMMUNITY): Payer: Medicaid Other

## 2013-11-06 ENCOUNTER — Encounter: Payer: Self-pay | Admitting: Radiology

## 2013-11-06 MED ORDER — IOHEXOL 300 MG/ML  SOLN
80.0000 mL | Freq: Once | INTRAMUSCULAR | Status: AC | PRN
Start: 2013-11-06 — End: 2013-11-06
  Administered 2013-11-06: 80 mL via INTRAVENOUS

## 2013-11-06 NOTE — Telephone Encounter (Signed)
Spoke with hospitalist after reviewing most recent CT scan from today. No evidence of contrast extravasation. Recommended getting a single contrasted upper GI to exclude a leak from his stomach. Told the hospitalist to let the patient drank the contrast in radiology and not get it through the NG tube. If the upper GI demonstrates no evidence of a leak explained that the patient could be given a trial of liquids by mouth with NG tube clamped and monitor for signs of leak such as pain, tachycardia, fever, leakage of enteric contents through midline wound. It tolerates by mouth without any evidence of leak then removed NG. Advised the hospitalist to contact me if he has any additional questions

## 2013-11-07 ENCOUNTER — Other Ambulatory Visit (HOSPITAL_COMMUNITY): Payer: Medicaid Other

## 2013-11-07 LAB — CBC WITH DIFFERENTIAL/PLATELET
Basophils Absolute: 0.1 10*3/uL (ref 0.0–0.1)
Basophils Relative: 1 % (ref 0–1)
EOS ABS: 0.1 10*3/uL (ref 0.0–0.7)
Eosinophils Relative: 1 % (ref 0–5)
HCT: 33.1 % — ABNORMAL LOW (ref 39.0–52.0)
Hemoglobin: 11.1 g/dL — ABNORMAL LOW (ref 13.0–17.0)
Lymphocytes Relative: 30 % (ref 12–46)
Lymphs Abs: 3.3 10*3/uL (ref 0.7–4.0)
MCH: 30.1 pg (ref 26.0–34.0)
MCHC: 33.5 g/dL (ref 30.0–36.0)
MCV: 89.7 fL (ref 78.0–100.0)
MONOS PCT: 12 % (ref 3–12)
Monocytes Absolute: 1.3 10*3/uL — ABNORMAL HIGH (ref 0.1–1.0)
NEUTROS PCT: 58 % (ref 43–77)
Neutro Abs: 6.3 10*3/uL (ref 1.7–7.7)
Platelets: 335 10*3/uL (ref 150–400)
RBC: 3.69 MIL/uL — ABNORMAL LOW (ref 4.22–5.81)
RDW: 14.4 % (ref 11.5–15.5)
WBC: 11 10*3/uL — ABNORMAL HIGH (ref 4.0–10.5)

## 2013-11-07 LAB — BASIC METABOLIC PANEL
BUN: 21 mg/dL (ref 6–23)
CO2: 26 mEq/L (ref 19–32)
Calcium: 9.2 mg/dL (ref 8.4–10.5)
Chloride: 101 mEq/L (ref 96–112)
Creatinine, Ser: 0.67 mg/dL (ref 0.50–1.35)
Glucose, Bld: 87 mg/dL (ref 70–99)
Potassium: 4.3 mEq/L (ref 3.7–5.3)
Sodium: 138 mEq/L (ref 137–147)

## 2013-11-07 MED ORDER — IOHEXOL 300 MG/ML  SOLN: 150.0000 mL | Freq: Once | INTRAMUSCULAR | Status: AC | PRN

## 2013-11-08 LAB — URINE MICROSCOPIC-ADD ON

## 2013-11-08 LAB — URINALYSIS, ROUTINE W REFLEX MICROSCOPIC
Bilirubin Urine: NEGATIVE
Glucose, UA: NEGATIVE mg/dL
Hgb urine dipstick: NEGATIVE
Ketones, ur: NEGATIVE mg/dL
Nitrite: NEGATIVE
PROTEIN: NEGATIVE mg/dL
Specific Gravity, Urine: 1.019 (ref 1.005–1.030)
UROBILINOGEN UA: 0.2 mg/dL (ref 0.0–1.0)
pH: 6 (ref 5.0–8.0)

## 2013-11-09 LAB — BASIC METABOLIC PANEL
BUN: 20 mg/dL (ref 6–23)
CO2: 27 mEq/L (ref 19–32)
CREATININE: 0.69 mg/dL (ref 0.50–1.35)
Calcium: 9 mg/dL (ref 8.4–10.5)
Chloride: 100 mEq/L (ref 96–112)
GFR calc non Af Amer: >90 mL/min (ref >90)
Glucose, Bld: 137 mg/dL — ABNORMAL HIGH (ref 70–99)
Potassium: 3.9 mEq/L (ref 3.7–5.3)
Sodium: 137 mEq/L (ref 137–147)

## 2013-11-09 LAB — URINE CULTURE
CULTURE: NO GROWTH
Colony Count: NO GROWTH

## 2013-11-10 LAB — COMPREHENSIVE METABOLIC PANEL
ALBUMIN: 2.5 g/dL — AB (ref 3.5–5.2)
ALT: 8 U/L (ref 0–53)
AST: 10 U/L (ref 0–37)
Alkaline Phosphatase: 94 U/L (ref 39–117)
BILIRUBIN TOTAL: 0.2 mg/dL — AB (ref 0.3–1.2)
BUN: 22 mg/dL (ref 6–23)
CO2: 27 mEq/L (ref 19–32)
CREATININE: 0.67 mg/dL (ref 0.50–1.35)
Calcium: 9 mg/dL (ref 8.4–10.5)
Chloride: 100 mEq/L (ref 96–112)
GFR calc Af Amer: >90 mL/min (ref >90)
GFR calc non Af Amer: >90 mL/min (ref >90)
Glucose, Bld: 130 mg/dL — ABNORMAL HIGH (ref 70–99)
POTASSIUM: 3.9 meq/L (ref 3.7–5.3)
Sodium: 137 mEq/L (ref 137–147)
TOTAL PROTEIN: 7 g/dL (ref 6.0–8.3)

## 2013-11-10 LAB — CBC WITH DIFFERENTIAL/PLATELET
BASOS ABS: 0 10*3/uL (ref 0.0–0.1)
BASOS PCT: 0 % (ref 0–1)
Eosinophils Absolute: 0.1 10*3/uL (ref 0.0–0.7)
Eosinophils Relative: 1 % (ref 0–5)
HCT: 31.7 % — ABNORMAL LOW (ref 39.0–52.0)
Hemoglobin: 10.6 g/dL — ABNORMAL LOW (ref 13.0–17.0)
Lymphocytes Relative: 30 % (ref 12–46)
Lymphs Abs: 2.8 10*3/uL (ref 0.7–4.0)
MCH: 30 pg (ref 26.0–34.0)
MCHC: 33.4 g/dL (ref 30.0–36.0)
MCV: 89.8 fL (ref 78.0–100.0)
MONO ABS: 1.1 10*3/uL — AB (ref 0.1–1.0)
Monocytes Relative: 11 % (ref 3–12)
NEUTROS ABS: 5.5 10*3/uL (ref 1.7–7.7)
NEUTROS PCT: 58 % (ref 43–77)
Platelets: 298 10*3/uL (ref 150–400)
RBC: 3.53 MIL/uL — ABNORMAL LOW (ref 4.22–5.81)
RDW: 14.6 % (ref 11.5–15.5)
WBC: 9.5 10*3/uL (ref 4.0–10.5)

## 2013-11-12 LAB — PREALBUMIN: Prealbumin: 19.6 mg/dL (ref 17.0–34.0)

## 2013-11-13 ENCOUNTER — Other Ambulatory Visit (HOSPITAL_COMMUNITY): Payer: Medicaid Other

## 2013-11-13 LAB — CBC WITH DIFFERENTIAL/PLATELET
Basophils Absolute: 0 10*3/uL (ref 0.0–0.1)
Basophils Relative: 0 % (ref 0–1)
EOS PCT: 2 % (ref 0–5)
Eosinophils Absolute: 0.2 10*3/uL (ref 0.0–0.7)
HEMATOCRIT: 33.6 % — AB (ref 39.0–52.0)
HEMOGLOBIN: 11.2 g/dL — AB (ref 13.0–17.0)
LYMPHS ABS: 3 10*3/uL (ref 0.7–4.0)
LYMPHS PCT: 31 % (ref 12–46)
MCH: 30.1 pg (ref 26.0–34.0)
MCHC: 33.3 g/dL (ref 30.0–36.0)
MCV: 90.3 fL (ref 78.0–100.0)
MONO ABS: 1 10*3/uL (ref 0.1–1.0)
MONOS PCT: 10 % (ref 3–12)
NEUTROS ABS: 5.4 10*3/uL (ref 1.7–7.7)
Neutrophils Relative %: 56 % (ref 43–77)
Platelets: 236 10*3/uL (ref 150–400)
RBC: 3.72 MIL/uL — ABNORMAL LOW (ref 4.22–5.81)
RDW: 14.5 % (ref 11.5–15.5)
WBC: 9.7 10*3/uL (ref 4.0–10.5)

## 2013-11-13 LAB — BASIC METABOLIC PANEL
BUN: 21 mg/dL (ref 6–23)
CALCIUM: 9.2 mg/dL (ref 8.4–10.5)
CHLORIDE: 102 meq/L (ref 96–112)
CO2: 27 mEq/L (ref 19–32)
CREATININE: 0.7 mg/dL (ref 0.50–1.35)
GFR calc Af Amer: >90 mL/min (ref >90)
GFR calc non Af Amer: >90 mL/min (ref >90)
GLUCOSE: 88 mg/dL (ref 70–99)
Potassium: 4.3 mEq/L (ref 3.7–5.3)
Sodium: 140 mEq/L (ref 137–147)

## 2013-11-13 MED ORDER — IOHEXOL 300 MG/ML  SOLN
150.0000 mL | Freq: Once | INTRAMUSCULAR | Status: AC | PRN
  Administered 2013-11-13: 150 mL via ORAL

## 2013-11-14 LAB — CBC WITH DIFFERENTIAL/PLATELET
BASOS ABS: 0 10*3/uL (ref 0.0–0.1)
Basophils Relative: 0 % (ref 0–1)
EOS PCT: 1 % (ref 0–5)
Eosinophils Absolute: 0.1 10*3/uL (ref 0.0–0.7)
HCT: 32.4 % — ABNORMAL LOW (ref 39.0–52.0)
Hemoglobin: 10.5 g/dL — ABNORMAL LOW (ref 13.0–17.0)
Lymphocytes Relative: 27 % (ref 12–46)
Lymphs Abs: 2.5 10*3/uL (ref 0.7–4.0)
MCH: 29.7 pg (ref 26.0–34.0)
MCHC: 32.4 g/dL (ref 30.0–36.0)
MCV: 91.8 fL (ref 78.0–100.0)
Monocytes Absolute: 1.1 10*3/uL — ABNORMAL HIGH (ref 0.1–1.0)
Monocytes Relative: 12 % (ref 3–12)
Neutro Abs: 5.4 10*3/uL (ref 1.7–7.7)
Neutrophils Relative %: 60 % (ref 43–77)
PLATELETS: 291 10*3/uL (ref 150–400)
RBC: 3.53 MIL/uL — ABNORMAL LOW (ref 4.22–5.81)
RDW: 14.6 % (ref 11.5–15.5)
WBC: 9 10*3/uL (ref 4.0–10.5)

## 2013-11-14 LAB — BASIC METABOLIC PANEL
BUN: 23 mg/dL (ref 6–23)
CALCIUM: 9.2 mg/dL (ref 8.4–10.5)
CO2: 26 mEq/L (ref 19–32)
Chloride: 99 mEq/L (ref 96–112)
Creatinine, Ser: 0.68 mg/dL (ref 0.50–1.35)
GFR calc Af Amer: >90 mL/min (ref >90)
GLUCOSE: 476 mg/dL — AB (ref 70–99)
Potassium: 4.4 mEq/L (ref 3.7–5.3)
SODIUM: 135 meq/L — AB (ref 137–147)

## 2013-11-15 LAB — BASIC METABOLIC PANEL
BUN: 19 mg/dL (ref 6–23)
CALCIUM: 9 mg/dL (ref 8.4–10.5)
CO2: 26 mEq/L (ref 19–32)
Chloride: 101 mEq/L (ref 96–112)
Creatinine, Ser: 0.67 mg/dL (ref 0.50–1.35)
Glucose, Bld: 88 mg/dL (ref 70–99)
Potassium: 4.4 mEq/L (ref 3.7–5.3)
Sodium: 138 mEq/L (ref 137–147)

## 2013-11-15 LAB — CBC WITH DIFFERENTIAL/PLATELET
BASOS ABS: 0 10*3/uL (ref 0.0–0.1)
Basophils Relative: 0 % (ref 0–1)
EOS ABS: 0.3 10*3/uL (ref 0.0–0.7)
EOS PCT: 4 % (ref 0–5)
HCT: 34 % — ABNORMAL LOW (ref 39.0–52.0)
Hemoglobin: 11.5 g/dL — ABNORMAL LOW (ref 13.0–17.0)
Lymphocytes Relative: 31 % (ref 12–46)
Lymphs Abs: 2.7 10*3/uL (ref 0.7–4.0)
MCH: 30.3 pg (ref 26.0–34.0)
MCHC: 33.8 g/dL (ref 30.0–36.0)
MCV: 89.5 fL (ref 78.0–100.0)
Monocytes Absolute: 1.1 10*3/uL — ABNORMAL HIGH (ref 0.1–1.0)
Monocytes Relative: 13 % — ABNORMAL HIGH (ref 3–12)
Neutro Abs: 4.6 10*3/uL (ref 1.7–7.7)
Neutrophils Relative %: 53 % (ref 43–77)
PLATELETS: 299 10*3/uL (ref 150–400)
RBC: 3.8 MIL/uL — ABNORMAL LOW (ref 4.22–5.81)
RDW: 14.4 % (ref 11.5–15.5)
WBC: 8.6 10*3/uL (ref 4.0–10.5)

## 2013-11-15 LAB — PHOSPHORUS: Phosphorus: 5.9 mg/dL — ABNORMAL HIGH (ref 2.3–4.6)

## 2013-11-15 LAB — MAGNESIUM: MAGNESIUM: 1.8 mg/dL (ref 1.5–2.5)

## 2013-11-16 LAB — BASIC METABOLIC PANEL
BUN: 19 mg/dL (ref 6–23)
CO2: 25 mEq/L (ref 19–32)
Calcium: 9.2 mg/dL (ref 8.4–10.5)
Chloride: 101 mEq/L (ref 96–112)
Creatinine, Ser: 0.68 mg/dL (ref 0.50–1.35)
Glucose, Bld: 78 mg/dL (ref 70–99)
POTASSIUM: 4.2 meq/L (ref 3.7–5.3)
SODIUM: 137 meq/L (ref 137–147)

## 2013-11-16 LAB — CBC
HCT: 33.4 % — ABNORMAL LOW (ref 39.0–52.0)
Hemoglobin: 11.1 g/dL — ABNORMAL LOW (ref 13.0–17.0)
MCH: 29.8 pg (ref 26.0–34.0)
MCHC: 33.2 g/dL (ref 30.0–36.0)
MCV: 89.8 fL (ref 78.0–100.0)
PLATELETS: 307 10*3/uL (ref 150–400)
RBC: 3.72 MIL/uL — ABNORMAL LOW (ref 4.22–5.81)
RDW: 14.3 % (ref 11.5–15.5)
WBC: 8.3 10*3/uL (ref 4.0–10.5)

## 2013-11-17 LAB — URINALYSIS, ROUTINE W REFLEX MICROSCOPIC
Bilirubin Urine: NEGATIVE
GLUCOSE, UA: NEGATIVE mg/dL
HGB URINE DIPSTICK: NEGATIVE
KETONES UR: NEGATIVE mg/dL
Nitrite: NEGATIVE
PROTEIN: NEGATIVE mg/dL
Specific Gravity, Urine: 1.016 (ref 1.005–1.030)
Urobilinogen, UA: 0.2 mg/dL (ref 0.0–1.0)
pH: 6.5 (ref 5.0–8.0)

## 2013-11-17 LAB — CBC
HCT: 35.4 % — ABNORMAL LOW (ref 39.0–52.0)
HEMOGLOBIN: 11.8 g/dL — AB (ref 13.0–17.0)
MCH: 29.9 pg (ref 26.0–34.0)
MCHC: 33.3 g/dL (ref 30.0–36.0)
MCV: 89.8 fL (ref 78.0–100.0)
Platelets: 291 10*3/uL (ref 150–400)
RBC: 3.94 MIL/uL — ABNORMAL LOW (ref 4.22–5.81)
RDW: 14.1 % (ref 11.5–15.5)
WBC: 8.8 10*3/uL (ref 4.0–10.5)

## 2013-11-17 LAB — BASIC METABOLIC PANEL
BUN: 18 mg/dL (ref 6–23)
CO2: 26 mEq/L (ref 19–32)
Calcium: 9.2 mg/dL (ref 8.4–10.5)
Chloride: 103 mEq/L (ref 96–112)
Creatinine, Ser: 0.65 mg/dL (ref 0.50–1.35)
Glucose, Bld: 86 mg/dL (ref 70–99)
Potassium: 4.3 mEq/L (ref 3.7–5.3)
Sodium: 139 mEq/L (ref 137–147)

## 2013-11-17 LAB — MAGNESIUM: Magnesium: 1.8 mg/dL (ref 1.5–2.5)

## 2013-11-17 LAB — URINE MICROSCOPIC-ADD ON

## 2013-11-17 LAB — PREALBUMIN: Prealbumin: 16.5 mg/dL — ABNORMAL LOW (ref 17.0–34.0)

## 2013-11-17 LAB — PHOSPHORUS: Phosphorus: 5.5 mg/dL — ABNORMAL HIGH (ref 2.3–4.6)

## 2013-11-18 LAB — BASIC METABOLIC PANEL
BUN: 17 mg/dL (ref 6–23)
CALCIUM: 8.7 mg/dL (ref 8.4–10.5)
CO2: 25 meq/L (ref 19–32)
CREATININE: 0.62 mg/dL (ref 0.50–1.35)
Chloride: 103 mEq/L (ref 96–112)
GFR calc Af Amer: >90 mL/min (ref >90)
GFR calc non Af Amer: >90 mL/min (ref >90)
Glucose, Bld: 94 mg/dL (ref 70–99)
Potassium: 3.9 mEq/L (ref 3.7–5.3)
Sodium: 139 mEq/L (ref 137–147)

## 2013-11-18 LAB — URINE CULTURE
Colony Count: NO GROWTH
Culture: NO GROWTH

## 2013-11-18 LAB — CBC
HCT: 33.1 % — ABNORMAL LOW (ref 39.0–52.0)
Hemoglobin: 10.9 g/dL — ABNORMAL LOW (ref 13.0–17.0)
MCH: 29.8 pg (ref 26.0–34.0)
MCHC: 32.9 g/dL (ref 30.0–36.0)
MCV: 90.4 fL (ref 78.0–100.0)
Platelets: 306 10*3/uL (ref 150–400)
RBC: 3.66 MIL/uL — AB (ref 4.22–5.81)
RDW: 14.2 % (ref 11.5–15.5)
WBC: 8.7 10*3/uL (ref 4.0–10.5)

## 2013-11-19 ENCOUNTER — Telehealth (INDEPENDENT_AMBULATORY_CARE_PROVIDER_SITE_OTHER): Payer: Self-pay

## 2013-11-19 ENCOUNTER — Other Ambulatory Visit (HOSPITAL_COMMUNITY): Payer: Medicaid Other

## 2013-11-19 ENCOUNTER — Encounter: Payer: Self-pay | Admitting: Radiology

## 2013-11-19 MED ORDER — IOHEXOL 300 MG/ML  SOLN
80.0000 mL | Freq: Once | INTRAMUSCULAR | Status: AC | PRN
  Administered 2013-11-19: 80 mL via INTRAVENOUS

## 2013-11-19 NOTE — Telephone Encounter (Signed)
[facility] called stating Dr Teena DunkHijavi is requesting Dr Andrey CampanileWilson call him regarding pt. Advised he is unavailable today and asked if he needed to speak with another MD or PA. Amber states no and asked if I could just send Dr Andrey CampanileWilson a message to call Dr Teena DunkHijavi Thursday morning at [phone].

## 2013-11-20 LAB — CBC
HCT: 34.9 % — ABNORMAL LOW (ref 39.0–52.0)
Hemoglobin: 11.7 g/dL — ABNORMAL LOW (ref 13.0–17.0)
MCH: 30.3 pg (ref 26.0–34.0)
MCHC: 33.5 g/dL (ref 30.0–36.0)
MCV: 90.4 fL (ref 78.0–100.0)
PLATELETS: 301 10*3/uL (ref 150–400)
RBC: 3.86 MIL/uL — ABNORMAL LOW (ref 4.22–5.81)
RDW: 14.3 % (ref 11.5–15.5)
WBC: 10.5 10*3/uL (ref 4.0–10.5)

## 2013-11-20 LAB — BASIC METABOLIC PANEL
BUN: 16 mg/dL (ref 6–23)
CO2: 26 mEq/L (ref 19–32)
Calcium: 9.1 mg/dL (ref 8.4–10.5)
Chloride: 101 mEq/L (ref 96–112)
Creatinine, Ser: 0.68 mg/dL (ref 0.50–1.35)
GFR calc Af Amer: >90 mL/min (ref >90)
Glucose, Bld: 82 mg/dL (ref 70–99)
Potassium: 4.4 mEq/L (ref 3.7–5.3)
Sodium: 138 mEq/L (ref 137–147)

## 2013-11-20 NOTE — Telephone Encounter (Signed)
Spoke with Dr Teena DunkHijavi - reviewed ct. Pt been On liquids for 4 days without any fevers or increase in white blood cell count. There is no extravasation of contrast on CT. His wound is stable. There is no evidence of drainage of enteric contents from his incision. At this point I advised that he continue with oral intake and his maximum diet status should be soft mechanical. Advised him if patient develops fever, worsening pain, elevated white blood cell count he is to be made n.p.o. immediately

## 2013-11-21 LAB — BASIC METABOLIC PANEL
BUN: 17 mg/dL (ref 6–23)
CHLORIDE: 100 meq/L (ref 96–112)
CO2: 24 meq/L (ref 19–32)
Calcium: 9.1 mg/dL (ref 8.4–10.5)
Creatinine, Ser: 0.58 mg/dL (ref 0.50–1.35)
GFR calc non Af Amer: >90 mL/min (ref >90)
Glucose, Bld: 102 mg/dL — ABNORMAL HIGH (ref 70–99)
Potassium: 4 mEq/L (ref 3.7–5.3)
Sodium: 136 mEq/L — ABNORMAL LOW (ref 137–147)

## 2013-11-21 LAB — CBC WITH DIFFERENTIAL/PLATELET
BASOS ABS: 0 10*3/uL (ref 0.0–0.1)
BASOS PCT: 0 % (ref 0–1)
Eosinophils Absolute: 0 10*3/uL (ref 0.0–0.7)
Eosinophils Relative: 0 % (ref 0–5)
HCT: 34.9 % — ABNORMAL LOW (ref 39.0–52.0)
Hemoglobin: 11.9 g/dL — ABNORMAL LOW (ref 13.0–17.0)
LYMPHS PCT: 20 % (ref 12–46)
Lymphs Abs: 2.4 10*3/uL (ref 0.7–4.0)
MCH: 30.1 pg (ref 26.0–34.0)
MCHC: 34.1 g/dL (ref 30.0–36.0)
MCV: 88.4 fL (ref 78.0–100.0)
Monocytes Absolute: 1.4 10*3/uL — ABNORMAL HIGH (ref 0.1–1.0)
Monocytes Relative: 11 % (ref 3–12)
NEUTROS ABS: 8.5 10*3/uL — AB (ref 1.7–7.7)
Neutrophils Relative %: 69 % (ref 43–77)
PLATELETS: 314 10*3/uL (ref 150–400)
RBC: 3.95 MIL/uL — ABNORMAL LOW (ref 4.22–5.81)
RDW: 14.1 % (ref 11.5–15.5)
WBC: 12.3 10*3/uL — AB (ref 4.0–10.5)

## 2013-11-21 LAB — MAGNESIUM: MAGNESIUM: 1.7 mg/dL (ref 1.5–2.5)

## 2013-11-21 LAB — PHOSPHORUS: PHOSPHORUS: 4.6 mg/dL (ref 2.3–4.6)

## 2013-11-22 ENCOUNTER — Institutional Professional Consult (permissible substitution) (HOSPITAL_COMMUNITY): Payer: Medicaid Other

## 2013-11-22 LAB — CBC WITH DIFFERENTIAL/PLATELET
Basophils Absolute: 0 10*3/uL (ref 0.0–0.1)
Basophils Relative: 0 % (ref 0–1)
Eosinophils Absolute: 0 10*3/uL (ref 0.0–0.7)
Eosinophils Relative: 0 % (ref 0–5)
HCT: 35 % — ABNORMAL LOW (ref 39.0–52.0)
HEMOGLOBIN: 11.8 g/dL — AB (ref 13.0–17.0)
Lymphocytes Relative: 14 % (ref 12–46)
Lymphs Abs: 2.4 10*3/uL (ref 0.7–4.0)
MCH: 30.2 pg (ref 26.0–34.0)
MCHC: 33.7 g/dL (ref 30.0–36.0)
MCV: 89.5 fL (ref 78.0–100.0)
MONOS PCT: 12 % (ref 3–12)
Monocytes Absolute: 2 10*3/uL — ABNORMAL HIGH (ref 0.1–1.0)
NEUTROS ABS: 12.2 10*3/uL — AB (ref 1.7–7.7)
Neutrophils Relative %: 74 % (ref 43–77)
PLATELETS: 324 10*3/uL (ref 150–400)
RBC: 3.91 MIL/uL — AB (ref 4.22–5.81)
RDW: 14.1 % (ref 11.5–15.5)
WBC: 16.6 10*3/uL — ABNORMAL HIGH (ref 4.0–10.5)

## 2013-11-22 LAB — PROCALCITONIN: Procalcitonin: <0.1 ng/mL

## 2013-11-22 NOTE — Progress Notes (Addendum)
[facility]                                                                                              Progress note     Patient Demographics  James HazelChristopher Mccormick, is a 48 y.o. male  ZOX:[id number]  WJX:[id number]  DOB - 08/16/1966  Admit date - 10/14/2013  Admitting Physician Carron CurieAli Zipporah Finamore, MD  Outpatient Primary MD for the patient is No PCP Per Patient  LOS - 39   Abdominal abscess         Subjective:   James Mccormick today has, nausea, vomiting, but no abdominal pain. The patient has some abdominal discomfort due to vomiting.  Objective:   Vital signs  Temperature 99.2 Heart rate 84 Respiratory rate 20  Blood pressure 120/66 Pulse ox 100%    Exam Awake Alert, Oriented X 3, No new F.N deficits, depressed [place].AT,PERRAL Supple Neck,No JVD, No cervical lymphadenopathy appriciated.  Symmetrical Chest wall movement, Good air movement bilaterally, CTAB IRREGULARLY IRREGULAR,No Gallops,Rubs or new Murmurs, No Parasternal Heave Degrees B.Sounds, Abd Soft, Non tender, No organomegaly appriciated, abdominal drains noted No Cyanosis, Clubbing or edema, No new Rash or bruise    I&Os 1277/1800  Data Review   CBC  Recent Labs Lab 11/17/13 0500 11/18/13 0745 11/20/13 0500 11/21/13 0500 11/22/13 0345  WBC 8.8 8.7 10.5 12.3* 16.6*  HGB 11.8* 10.9* 11.7* 11.9* 11.8*  HCT 35.4* 33.1* 34.9* 34.9* 35.0*  PLT 291 306 301 314 324  MCV 89.8 90.4 90.4 88.4 89.5  MCH 29.9 29.8 30.3 30.1 30.2  MCHC 33.3 32.9 33.5 34.1 33.7  RDW 14.1 14.2 14.3 14.1 14.1  LYMPHSABS  --   --   --  2.4 2.4  MONOABS  --   --   --  1.4* 2.0*  EOSABS  --   --   --  0.0 0.0  BASOSABS  --   --   --  0.0 0.0    Chemistries   Recent Labs Lab 11/16/13 0500 11/17/13 0500 11/18/13 0745 11/20/13 0500 11/21/13 0500  NA 137 139 139 138 136*  K 4.2 4.3 3.9 4.4 4.0  CL 101 103 103 101 100  CO2 25 26 25 26 24    GLUCOSE 78 86 94 82 102*  BUN 19 18 17 16 17   CREATININE 0.68 0.65 0.62 0.68 0.58  CALCIUM 9.2 9.2 8.7 9.1 9.1  MG  --  1.8  --   --  1.7   Coagulation profile No results found for this basename: INR, PROTIME,  in the last 168 hours  No results found for this basename: DDIMER,  in the last 72 hours  Cardiac Enzymes No results found for this basename: CK, CKMB, TROPONINI, MYOGLOBIN,  in the last 168 hours ------------------------------------------------------------------------------------------------------------------ No components found with this basename: POCBNP,   Micro Results Recent Results (from the past 240 hour(s))  URINE CULTURE     Status: None   Collection Time    11/17/13  3:21 PM      Result Value Ref Range Status   Specimen Description URINE, RANDOM   Final   Special  Requests NONE   Final   Culture  Setup Time     Final   Value: 11/17/2013 19:12     Performed at [facility] Count     Final   Value: NO GROWTH     Performed at [facility]   Culture     Final   Value: NO GROWTH     Performed at [facility]   Report Status 11/18/2013 FINAL   Final       Assessment & Plan   Abdominal abscess status post perforated gastric ulcer, status post abdominal drains noted Abdomen wound with intra-abdominal fistula Generalized weakness Protein calorie malnutrition on TPN A. fib with controlled rate coronary artery disease status post NSTEMI Depression GERD History of C. difficile colitis Right IJ thrombus Acute on chronic pain syndrome Nausea  Plan  N.p.o. Insert NG tube Place to suction Restart TPN Check labs in a.m. DC aspirin Change Protonix to I.V. Change medications to IV  Code Status: Full  DVT Prophylaxis   Lovenox   Carron Curie M.D on 11/22/2013 at 3:13 PM

## 2013-11-23 ENCOUNTER — Encounter: Payer: Self-pay | Admitting: Radiology

## 2013-11-23 ENCOUNTER — Other Ambulatory Visit (HOSPITAL_COMMUNITY): Payer: Medicaid Other

## 2013-11-23 LAB — BASIC METABOLIC PANEL
BUN: 13 mg/dL (ref 6–23)
CHLORIDE: 95 meq/L — AB (ref 96–112)
CO2: 25 meq/L (ref 19–32)
Calcium: 9 mg/dL (ref 8.4–10.5)
Creatinine, Ser: 0.53 mg/dL (ref 0.50–1.35)
GFR calc non Af Amer: >90 mL/min (ref >90)
Glucose, Bld: 160 mg/dL — ABNORMAL HIGH (ref 70–99)
Potassium: 3.8 mEq/L (ref 3.7–5.3)
SODIUM: 133 meq/L — AB (ref 137–147)

## 2013-11-23 LAB — CBC
HCT: 38.1 % — ABNORMAL LOW (ref 39.0–52.0)
Hemoglobin: 12.9 g/dL — ABNORMAL LOW (ref 13.0–17.0)
MCH: 30.3 pg (ref 26.0–34.0)
MCHC: 33.9 g/dL (ref 30.0–36.0)
MCV: 89.4 fL (ref 78.0–100.0)
Platelets: 347 10*3/uL (ref 150–400)
RBC: 4.26 MIL/uL (ref 4.22–5.81)
RDW: 14.4 % (ref 11.5–15.5)
WBC: 28.9 10*3/uL — AB (ref 4.0–10.5)

## 2013-11-23 MED ORDER — IOHEXOL 300 MG/ML  SOLN: 25.0000 mL | INTRAMUSCULAR | Status: AC

## 2013-11-23 MED ORDER — IOHEXOL 300 MG/ML  SOLN
100.0000 mL | Freq: Once | INTRAMUSCULAR | Status: AC | PRN
  Administered 2013-11-23: 100 mL via INTRAVENOUS

## 2013-11-23 NOTE — Progress Notes (Addendum)
[facility]                                                                                              Progress note     Patient Demographics  Akiva Brassfield, is a 48 y.o. male  ZOX:[id number]  WJX:[id number]  DOB - 1966/07/08  Admit date - 10/14/2013  Admitting Physician Carron Curie, MD  Outpatient Primary MD for the patient is No PCP Per Patient  LOS - 40   Abdominal abscess         Subjective:   Cristal Deer Sweet today has, right abdominal discomfort but no significant pain no fever or chills, no nausea or vomiting, NG tube in place Objective:   Vital signs  Temperature 9 9 Heart rate 7 8 Respiratory rate 20  Blood pressure 117/71 Pulse ox 100%    Exam Awake Alert, Oriented X 3, No new F.N deficits, depressed [place].AT,PERRAL Supple Neck,No JVD, No cervical lymphadenopathy appriciated.  Symmetrical Chest wall movement, Good air movement bilaterally, CTAB IRREGULARLY IRREGULAR,No Gallops,Rubs or new Murmurs, No Parasternal Heave Degrees B.Sounds, Abd Soft, Non tender, No organomegaly appriciated, abdominal drains noted No Cyanosis, Clubbing or edema, No new Rash or bruise    I&Os  1588/2038  Data Review   CBC  Recent Labs Lab 11/18/13 0745 11/20/13 0500 11/21/13 0500 11/22/13 0345 11/23/13 0600  WBC 8.7 10.5 12.3* 16.6* 28.9*  HGB 10.9* 11.7* 11.9* 11.8* 12.9*  HCT 33.1* 34.9* 34.9* 35.0* 38.1*  PLT 306 301 314 324 347  MCV 90.4 90.4 88.4 89.5 89.4  MCH 29.8 30.3 30.1 30.2 30.3  MCHC 32.9 33.5 34.1 33.7 33.9  RDW 14.2 14.3 14.1 14.1 14.4  LYMPHSABS  --   --  2.4 2.4  --   MONOABS  --   --  1.4* 2.0*  --   EOSABS  --   --  0.0 0.0  --   BASOSABS  --   --  0.0 0.0  --     Chemistries   Recent Labs Lab 11/17/13 0500 11/18/13 0745 11/20/13 0500 11/21/13 0500 11/23/13 0600  NA 139 139 138 136* 133*  K 4.3 3.9 4.4 4.0 3.8  CL 103 103 101 100 95*  CO2 26  25 26 24 25   GLUCOSE 86 94 82 102* 160*  BUN 18 17 16 17 13   CREATININE 0.65 0.62 0.68 0.58 0.53  CALCIUM 9.2 8.7 9.1 9.1 9.0  MG 1.8  --   --  1.7  --    Coagulation profile No results found for this basename: INR, PROTIME,  in the last 168 hours  No results found for this basename: DDIMER,  in the last 72 hours  Cardiac Enzymes No results found for this basename: CK, CKMB, TROPONINI, MYOGLOBIN,  in the last 168 hours ------------------------------------------------------------------------------------------------------------------ No components found with this basename: POCBNP,   Micro Results Recent Results (from the past 240 hour(s))  URINE CULTURE     Status: None   Collection Time    11/17/13  3:21 PM      Result Value Ref Range Status   Specimen Description URINE, RANDOM  Final   Special Requests NONE   Final   Culture  Setup Time     Final   Value: 11/17/2013 19:12     Performed at [facility]   Colony Count     Final   Value: NO GROWTH     Performed at [facility]   Culture     Final   Value: NO GROWTH     Performed at [facility]   Report Status 11/18/2013 FINAL   Final       Assessment & Plan   Abdominal abscess status post perforated gastric ulcer, status post abdominal drains noted Abdomen wound with intra-abdominal fistula Generalized weakness continue with PT OT as tolerated Protein calorie malnutrition on TPN A. fib with controlled rate coronary artery disease status post NSTEMI Depression GERD History of C. difficile colitis Right IJ thrombus Acute on chronic pain syndrome Nausea Leukocytosis, check CT of abdomen stat  Plan  Check CT of abdomen stat Check CBC/ BMP in a.m.  Will discuss with surgery  critical care time; 30th minutes  Code Status: Full  DVT Prophylaxis   Lovenox   Carron CurieHijazi, Pearlene Teat M.D on 11/23/2013 at 12:12 PM

## 2013-11-24 ENCOUNTER — Other Ambulatory Visit (HOSPITAL_COMMUNITY): Payer: Medicaid Other

## 2013-11-24 ENCOUNTER — Institutional Professional Consult (permissible substitution) (HOSPITAL_COMMUNITY): Payer: Medicaid Other

## 2013-11-24 LAB — URINALYSIS, ROUTINE W REFLEX MICROSCOPIC
Glucose, UA: 100 mg/dL — AB
Hgb urine dipstick: NEGATIVE
KETONES UR: 15 mg/dL — AB
NITRITE: NEGATIVE
Protein, ur: >300 mg/dL — AB
SPECIFIC GRAVITY, URINE: 1.026 (ref 1.005–1.030)
UROBILINOGEN UA: 0.2 mg/dL (ref 0.0–1.0)
pH: 6 (ref 5.0–8.0)

## 2013-11-24 LAB — CBC
HCT: 34.4 % — ABNORMAL LOW (ref 39.0–52.0)
Hemoglobin: 11.4 g/dL — ABNORMAL LOW (ref 13.0–17.0)
MCH: 29.9 pg (ref 26.0–34.0)
MCHC: 33.1 g/dL (ref 30.0–36.0)
MCV: 90.3 fL (ref 78.0–100.0)
PLATELETS: 317 10*3/uL (ref 150–400)
RBC: 3.81 MIL/uL — ABNORMAL LOW (ref 4.22–5.81)
RDW: 14.4 % (ref 11.5–15.5)
WBC: 25.8 10*3/uL — ABNORMAL HIGH (ref 4.0–10.5)

## 2013-11-24 LAB — HEPATIC FUNCTION PANEL
ALT: 64 U/L — ABNORMAL HIGH (ref 0–53)
AST: 89 U/L — ABNORMAL HIGH (ref 0–37)
Albumin: 2.2 g/dL — ABNORMAL LOW (ref 3.5–5.2)
Alkaline Phosphatase: 564 U/L — ABNORMAL HIGH (ref 39–117)
BILIRUBIN DIRECT: 1.7 mg/dL — AB (ref 0.0–0.3)
BILIRUBIN INDIRECT: 0.3 mg/dL (ref 0.3–0.9)
Total Bilirubin: 2 mg/dL — ABNORMAL HIGH (ref 0.3–1.2)
Total Protein: 6.7 g/dL (ref 6.0–8.3)

## 2013-11-24 LAB — BASIC METABOLIC PANEL
BUN: 19 mg/dL (ref 6–23)
CO2: 24 mEq/L (ref 19–32)
CREATININE: 0.65 mg/dL (ref 0.50–1.35)
Calcium: 8.6 mg/dL (ref 8.4–10.5)
Chloride: 101 mEq/L (ref 96–112)
GFR calc non Af Amer: >90 mL/min (ref >90)
Glucose, Bld: 166 mg/dL — ABNORMAL HIGH (ref 70–99)
Potassium: 3.4 mEq/L — ABNORMAL LOW (ref 3.7–5.3)
Sodium: 139 mEq/L (ref 137–147)

## 2013-11-24 LAB — PROCALCITONIN: PROCALCITONIN: 4.79 ng/mL

## 2013-11-24 LAB — URINE MICROSCOPIC-ADD ON

## 2013-11-24 MED ORDER — TECHNETIUM TC 99M MEBROFENIN IV KIT
5.0000 | PACK | Freq: Once | INTRAVENOUS | Status: AC | PRN
Start: 2013-11-24 — End: 2013-11-24
  Administered 2013-11-24: 5 via INTRAVENOUS

## 2013-11-25 ENCOUNTER — Other Ambulatory Visit (HOSPITAL_COMMUNITY): Payer: Medicaid Other

## 2013-11-25 ENCOUNTER — Encounter: Payer: Self-pay | Admitting: Radiology

## 2013-11-25 LAB — PROTIME-INR
INR: 2.54 — ABNORMAL HIGH (ref 0.00–1.49)
INR: 1.72 — ABNORMAL HIGH (ref 0.00–1.49)
PROTHROMBIN TIME: 26.5 s — AB (ref 11.6–15.2)
Prothrombin Time: 19.7 seconds — ABNORMAL HIGH (ref 11.6–15.2)

## 2013-11-25 LAB — BASIC METABOLIC PANEL
BUN: 18 mg/dL (ref 6–23)
CALCIUM: 7.9 mg/dL — AB (ref 8.4–10.5)
CO2: 22 mEq/L (ref 19–32)
Chloride: 105 mEq/L (ref 96–112)
Creatinine, Ser: 0.73 mg/dL (ref 0.50–1.35)
GFR calc Af Amer: >90 mL/min (ref >90)
GLUCOSE: 108 mg/dL — AB (ref 70–99)
Potassium: 3.2 mEq/L — ABNORMAL LOW (ref 3.7–5.3)
SODIUM: 140 meq/L (ref 137–147)

## 2013-11-25 LAB — CBC WITH DIFFERENTIAL/PLATELET
BASOS PCT: 0 % (ref 0–1)
Basophils Absolute: 0 10*3/uL (ref 0.0–0.1)
EOS ABS: 0 10*3/uL (ref 0.0–0.7)
Eosinophils Relative: 0 % (ref 0–5)
HCT: 30.9 % — ABNORMAL LOW (ref 39.0–52.0)
Hemoglobin: 10.1 g/dL — ABNORMAL LOW (ref 13.0–17.0)
Lymphocytes Relative: 6 % — ABNORMAL LOW (ref 12–46)
Lymphs Abs: 1.2 10*3/uL (ref 0.7–4.0)
MCH: 29.4 pg (ref 26.0–34.0)
MCHC: 32.7 g/dL (ref 30.0–36.0)
MCV: 90.1 fL (ref 78.0–100.0)
Monocytes Absolute: 1.5 10*3/uL — ABNORMAL HIGH (ref 0.1–1.0)
Monocytes Relative: 8 % (ref 3–12)
NEUTROS PCT: 86 % — AB (ref 43–77)
Neutro Abs: 17.1 10*3/uL — ABNORMAL HIGH (ref 1.7–7.7)
Platelets: 317 10*3/uL (ref 150–400)
RBC: 3.43 MIL/uL — ABNORMAL LOW (ref 4.22–5.81)
RDW: 14.3 % (ref 11.5–15.5)
WBC: 19.9 10*3/uL — ABNORMAL HIGH (ref 4.0–10.5)

## 2013-11-25 LAB — APTT
APTT: 34 s (ref 24–37)
aPTT: 45 seconds — ABNORMAL HIGH (ref 24–37)

## 2013-11-25 LAB — URINE CULTURE
COLONY COUNT: NO GROWTH
Culture: NO GROWTH
Special Requests: NORMAL

## 2013-11-25 LAB — ABO/RH: ABO/RH(D): A POS

## 2013-11-25 LAB — MAGNESIUM: Magnesium: 2 mg/dL (ref 1.5–2.5)

## 2013-11-25 MED ORDER — FENTANYL CITRATE 0.05 MG/ML IJ SOLN
INTRAMUSCULAR | Status: AC
  Filled 2013-11-25: qty 4

## 2013-11-25 MED ORDER — MIDAZOLAM HCL 2 MG/2ML IJ SOLN
INTRAMUSCULAR | Status: AC | PRN
  Administered 2013-11-25: 2 mg via INTRAVENOUS

## 2013-11-25 MED ORDER — MEPERIDINE HCL 50 MG/ML IJ SOLN
INTRAMUSCULAR | Status: AC
  Filled 2013-11-25: qty 1

## 2013-11-25 MED ORDER — HYDROMORPHONE HCL PF 1 MG/ML IJ SOLN
INTRAMUSCULAR | Status: AC | PRN
  Administered 2013-11-25: 1 mg

## 2013-11-25 MED ORDER — HYDROMORPHONE HCL PF 1 MG/ML IJ SOLN
INTRAMUSCULAR | Status: AC
  Filled 2013-11-25: qty 2

## 2013-11-25 MED ORDER — MIDAZOLAM HCL 2 MG/2ML IJ SOLN
INTRAMUSCULAR | Status: AC
  Filled 2013-11-25: qty 4

## 2013-11-25 MED ORDER — FENTANYL CITRATE 0.05 MG/ML IJ SOLN
INTRAMUSCULAR | Status: AC | PRN
Start: 2013-11-25 — End: 2013-11-25
  Administered 2013-11-25 (×2): 50 ug via INTRAVENOUS

## 2013-11-25 MED ORDER — IOHEXOL 300 MG/ML  SOLN
50.0000 mL | Freq: Once | INTRAMUSCULAR | Status: AC | PRN
  Administered 2013-11-25: 20 mL

## 2013-11-25 NOTE — H&P (Signed)
James Mccormick is an 48 y.o. male.   Chief Complaint: RUQ pain; leukocytosis; fever 102 yesterday Wbc 19 .9 (25.8) HIDA + 4/6 CT reveals cholecystitis without ductal dilitation Not surgical candidate secondary to recent abd surgeries; abscess and drains Scheduled now for percutaneous cholecystostomy drain placement Lovenox held today INR 2.5 today-- will make Dr Pascal Lux aware   HPI: perf gastric ulcer--surgeries/abscess and long hospital course; HTN; depression; Afib; UA 4/6: large Bili; +gluc; +ketones; granular casts  Past Medical History  Diagnosis Date  . GERD (gastroesophageal reflux disease)   . Hypertension   . Depression   . Pleural effusion   . Dysrhythmia     ATRIAL FIBRILATION 08/2013    Past Surgical History  Procedure Laterality Date  . Neck surgery    . Laparotomy N/A 08/28/2013    Procedure: EXPLORATORY LAPAROTOMY,debridment of nacrotic stomach,and primary repair of perforated stomach.;  Surgeon: Gayland Curry, MD;  Location: [facility] OR;  Service: General;  Laterality: N/A;    Family History  Problem Relation Age of Onset  . Mental illness Mother   . Hypertension Sister   . Hypertension Brother    Social History:  reports that he has been smoking Cigarettes.  He has been smoking about 1.00 pack per day. He does not have any smokeless tobacco history on file. He reports that he does not drink alcohol or use illicit drugs.  Allergies: No Known Allergies  No prescriptions prior to admission    Results for orders placed during the hospital encounter of 10/14/13 (from the past 48 hour(s))  CBC     Status: Abnormal   Collection Time    11/24/13  5:00 AM      Result Value Ref Range   WBC 25.8 (*) 4.0 - 10.5 K/uL   Comment: REPEATED TO VERIFY   RBC 3.81 (*) 4.22 - 5.81 MIL/uL   Hemoglobin 11.4 (*) 13.0 - 17.0 g/dL   Comment: REPEATED TO VERIFY   HCT 34.4 (*) 39.0 - 52.0 %   MCV 90.3  78.0 - 100.0 fL   MCH 29.9  26.0 - 34.0 pg   MCHC 33.1  30.0 - 36.0 g/dL    RDW 14.4  11.5 - 15.5 %   Platelets 317  150 - 400 K/uL  BASIC METABOLIC PANEL     Status: Abnormal   Collection Time    11/24/13  5:00 AM      Result Value Ref Range   Sodium 139  137 - 147 mEq/L   Potassium 3.4 (*) 3.7 - 5.3 mEq/L   Chloride 101  96 - 112 mEq/L   CO2 24  19 - 32 mEq/L   Glucose, Bld 166 (*) 70 - 99 mg/dL   BUN 19  6 - 23 mg/dL   Creatinine, Ser 0.65  0.50 - 1.35 mg/dL   Calcium 8.6  8.4 - 10.5 mg/dL   GFR calc non Af Amer >90  >90 mL/min   GFR calc Af Amer >90  >90 mL/min   Comment: (NOTE)     The eGFR has been calculated using the CKD EPI equation.     This calculation has not been validated in all clinical situations.     eGFR's persistently <90 mL/min signify possible Chronic Kidney     Disease.  PROCALCITONIN     Status: None   Collection Time    11/24/13  5:00 AM      Result Value Ref Range   Procalcitonin 4.79  Comment:            Interpretation:     PCT > 2 ng/mL:     Systemic infection (sepsis) is likely,     unless other causes are known.     (NOTE)             ICU PCT Algorithm               Non ICU PCT Algorithm        ----------------------------     ------------------------------             PCT < 0.25 ng/mL                 PCT < 0.1 ng/mL         Stopping of antibiotics            Stopping of antibiotics           strongly encouraged.               strongly encouraged.        ----------------------------     ------------------------------           PCT level decrease by               PCT < 0.25 ng/mL           >= 80% from peak PCT           OR PCT 0.25 - 0.5 ng/mL          Stopping of antibiotics                                                 encouraged.         Stopping of antibiotics               encouraged.        ----------------------------     ------------------------------           PCT level decrease by              PCT >= 0.25 ng/mL           < 80% from peak PCT            AND PCT >= 0.5 ng/mL            Continuing  antibiotics                                                  encouraged.           Continuing antibiotics                encouraged.        ----------------------------     ------------------------------         PCT level increase compared          PCT > 0.5 ng/mL             with peak PCT AND              PCT >= 0.5 ng/mL             Escalation of antibiotics  strongly encouraged.          Escalation of antibiotics            strongly encouraged.  HEPATIC FUNCTION PANEL     Status: Abnormal   Collection Time    11/24/13  5:00 AM      Result Value Ref Range   Total Protein 6.7  6.0 - 8.3 g/dL   Albumin 2.2 (*) 3.5 - 5.2 g/dL   AST 89 (*) 0 - 37 U/L   ALT 64 (*) 0 - 53 U/L   Alkaline Phosphatase 564 (*) 39 - 117 U/L   Total Bilirubin 2.0 (*) 0.3 - 1.2 mg/dL   Bilirubin, Direct 1.7 (*) 0.0 - 0.3 mg/dL   Indirect Bilirubin 0.3  0.3 - 0.9 mg/dL  URINALYSIS, ROUTINE W REFLEX MICROSCOPIC     Status: Abnormal   Collection Time    11/24/13 10:20 AM      Result Value Ref Range   Color, Urine AMBER (*) YELLOW   Comment: BIOCHEMICALS MAY BE AFFECTED BY COLOR   APPearance HAZY (*) CLEAR   Specific Gravity, Urine 1.026  1.005 - 1.030   pH 6.0  5.0 - 8.0   Glucose, UA 100 (*) NEGATIVE mg/dL   Hgb urine dipstick NEGATIVE  NEGATIVE   Bilirubin Urine LARGE (*) NEGATIVE   Ketones, ur 15 (*) NEGATIVE mg/dL   Protein, ur >300 (*) NEGATIVE mg/dL   Urobilinogen, UA 0.2  0.0 - 1.0 mg/dL   Nitrite NEGATIVE  NEGATIVE   Leukocytes, UA SMALL (*) NEGATIVE  URINE MICROSCOPIC-ADD ON     Status: Abnormal   Collection Time    11/24/13 10:20 AM      Result Value Ref Range   Squamous Epithelial / LPF RARE  RARE   WBC, UA 0-2  <3 WBC/hpf   RBC / HPF 0-2  <3 RBC/hpf   Bacteria, UA FEW (*) RARE   Casts GRANULAR CAST (*) NEGATIVE  CULTURE, BLOOD (ROUTINE X 2)     Status: None   Collection Time    11/24/13 12:10 PM      Result Value Ref Range   Specimen  Description BLOOD RIGHT ANTECUBITAL     Special Requests BOTTLES DRAWN AEROBIC AND ANAEROBIC 5CC     Culture  Setup Time       Value: 11/24/2013 16:06     Performed at [facility]   Culture       Value:        BLOOD CULTURE RECEIVED NO GROWTH TO DATE CULTURE WILL BE HELD FOR 5 DAYS BEFORE ISSUING A FINAL NEGATIVE REPORT     Performed at [facility]   Report Status PENDING    CULTURE, BLOOD (ROUTINE X 2)     Status: None   Collection Time    11/24/13 12:15 PM      Result Value Ref Range   Specimen Description BLOOD RIGHT [place]     Special Requests BOTTLES DRAWN AEROBIC ONLY 4CC     Culture  Setup Time       Value: 11/24/2013 16:06     Performed at [facility]   Culture       Value:        BLOOD CULTURE RECEIVED NO GROWTH TO DATE CULTURE WILL BE HELD FOR 5 DAYS BEFORE ISSUING A FINAL NEGATIVE REPORT     Performed at [facility]   Report Status PENDING    BASIC METABOLIC PANEL  Status: Abnormal   Collection Time    11/25/13  6:00 AM      Result Value Ref Range   Sodium 140  137 - 147 mEq/L   Potassium 3.2 (*) 3.7 - 5.3 mEq/L   Chloride 105  96 - 112 mEq/L   CO2 22  19 - 32 mEq/L   Glucose, Bld 108 (*) 70 - 99 mg/dL   BUN 18  6 - 23 mg/dL   Creatinine, Ser 0.73  0.50 - 1.35 mg/dL   Calcium 7.9 (*) 8.4 - 10.5 mg/dL   GFR calc non Af Amer >90  >90 mL/min   GFR calc Af Amer >90  >90 mL/min   Comment: (NOTE)     The eGFR has been calculated using the CKD EPI equation.     This calculation has not been validated in all clinical situations.     eGFR's persistently <90 mL/min signify possible Chronic Kidney     Disease.  CBC WITH DIFFERENTIAL     Status: Abnormal   Collection Time    11/25/13  6:00 AM      Result Value Ref Range   WBC 19.9 (*) 4.0 - 10.5 K/uL   RBC 3.43 (*) 4.22 - 5.81 MIL/uL   Hemoglobin 10.1 (*) 13.0 - 17.0 g/dL   HCT 30.9 (*) 39.0 - 52.0 %   MCV 90.1  78.0 - 100.0 fL   MCH 29.4  26.0 - 34.0 pg   MCHC 32.7  30.0 - 36.0  g/dL   RDW 14.3  11.5 - 15.5 %   Platelets 317  150 - 400 K/uL   Neutrophils Relative % 86 (*) 43 - 77 %   Neutro Abs 17.1 (*) 1.7 - 7.7 K/uL   Lymphocytes Relative 6 (*) 12 - 46 %   Lymphs Abs 1.2  0.7 - 4.0 K/uL   Monocytes Relative 8  3 - 12 %   Monocytes Absolute 1.5 (*) 0.1 - 1.0 K/uL   Eosinophils Relative 0  0 - 5 %   Eosinophils Absolute 0.0  0.0 - 0.7 K/uL   Basophils Relative 0  0 - 1 %   Basophils Absolute 0.0  0.0 - 0.1 K/uL  MAGNESIUM     Status: None   Collection Time    11/25/13  6:00 AM      Result Value Ref Range   Magnesium 2.0  1.5 - 2.5 mg/dL  PROTIME-INR     Status: Abnormal   Collection Time    11/25/13  7:20 AM      Result Value Ref Range   Prothrombin Time 26.5 (*) 11.6 - 15.2 seconds   INR 2.54 (*) 0.00 - 1.49  APTT     Status: Abnormal   Collection Time    11/25/13  7:20 AM      Result Value Ref Range   aPTT 45 (*) 24 - 37 seconds   Comment:            IF BASELINE aPTT IS ELEVATED,     SUGGEST PATIENT RISK ASSESSMENT     BE USED TO DETERMINE APPROPRIATE     ANTICOAGULANT THERAPY.   Nm Hepatobiliary Liver Func  11/24/2013   CLINICAL DATA:  Abnormal gallbladder on CT  EXAM: NUCLEAR MEDICINE HEPATOBILIARY IMAGING  TECHNIQUE: Sequential images of the abdomen were obtained out to 60 minutes following intravenous administration of radiopharmaceutical.  RADIOPHARMACEUTICALS:  5.0 mCi Tc-35mCholetec  COMPARISON:  None.  FINDINGS: Following the intravenous administration of  the radiopharmaceutical there is uniform tracer uptake by the liver. Delayed clearance of the radiotracer from the blood pool is identified. After 60 min of dynamic imaging no radiotracer activity is identified within the common bile duct, gallbladder or small bowel loops. Imaging was continued for 120 min without evidence for activity within the gallbladder, cystic duct or small-bowel. Persistent blood pool activity is identified on the delayed images.  IMPRESSION: 1. No persistent radiotracer  activity within the liver and blood pool without excretion of contrast into the biliary system. Findings may reflect sequelae of high-grade common bile duct obstruction or intrinsic hepatic dysfunction. 2. Results were discussed in person at the time of interpretation on 11/24/2013 at 4:53 PM to Dr. Pleas Patricia , who verbally acknowledged these results.   Electronically Signed   By: Kerby Moors M.D.   On: 11/24/2013 18:20   Ct Abdomen Pelvis W Contrast  11/23/2013   CLINICAL DATA:  Abdominal pain and vomiting.  Known abscesses.  EXAM: CT ABDOMEN AND PELVIS WITH CONTRAST  TECHNIQUE: Multidetector CT imaging of the abdomen and pelvis was performed using the standard protocol following bolus administration of intravenous contrast.  CONTRAST:  180m OMNIPAQUE IOHEXOL 300 MG/ML  SOLN  COMPARISON:  11/19/2013 and 11/06/2013  FINDINGS: The lung bases demonstrate consolidation right lower lobe without significant change likely atelectasis. Very minimal posterior dependent left basilar atelectasis. Central venous catheter tip noted of the cavoatrial junction  Abdominal images demonstrate a nasogastric tube with tip over the proximal stomach in the left upper quadrant. Left anterior percutaneous drainage catheter without significant change with tip over the epigastric region in the midline. Right anterior percutaneous drainage catheter with tip unchanged over the midline adjacent the lateral segment of the left lobe of the liver. The there are few tiny flecks of free air over the upper abdomen slightly improved. The liver, spleen, pancreas and adrenal glands are within normal. Gallbladder is slightly distended measuring 12.6 cm an launch diameter with interval development of adjacent inflammatory change and wall enhancement. No definite gallstones or ductal stones visualized. No ductal dilatation. There is a small amount of perihepatic fluid slightly worse.  There is continued evidence of multiple small fluid collections  throughout the abdomen without overall change compared to the previous exam likely multiple small abscesses. Largest collections are over the left upper quadrant measuring 2.6 x 6.1 cm as well as over the pelvis measuring 4 x 4.8 cm and 3.9 x 4.5 cm respectively.  Kidneys are normal in size with 2 mm stones over the lower poles bilaterally. There is no significant hydronephrosis. There is a sub cm hypodensity over the upper pole of the right kidney too small to characterize but likely a cyst and unchanged. The right ureter is normal. There is continued evidence of a 4-5 mm stone over the proximal left ureter unchanged. There is a 2 mm stone over the left posterior dependent portion of the bladder likely recently passed. There is mild diverticulosis of the colon. Appendix is normal.  There is degenerative changes of the spine and hips.  IMPRESSION: Multiple small fluid collections throughout the abdomen and pelvis compatible with known abscesses as these are overall without significant change from 11/19/2013. Large is collections are over the left upper quadrant measuring 2.6 x 6.1 cm and over the pelvis measuring 4 x 4.8 cm and 3.9 x 4.5 cm respectively. Bilateral upper anterior abdominal percutaneous drainage catheters with tips over the midline upper abdomen without significant change. Few tiny flecks of free  air over the upper abdomen improved and likely related to catheter insertion.  Dilatation of the gallbladder measuring 12.6 cm in diameter with wall enhancement and moderate interval development of adjacent inflammatory change. No definite stones or ductal dilatation present. Recommend clinical correlation for cholecystitis.  Bilateral nephrolithiasis. No change in a 4-5 mm stone over the proximal left ureter without obstruction. 2 mm stone within the left posterior dependent portion of the bladder.  Bibasilar atelectasis.  Diverticulosis.  Sub cm right renal hypodensity too small to characterize but likely a  cyst and unchanged.  Remaining tubes and lines as described.   Electronically Signed   By: Marin Olp M.D.   On: 11/23/2013 17:46   Dg Chest Port 1 View  11/24/2013   CLINICAL DATA:  Abdominal abscess and fistula.  Weakness.  EXAM: PORTABLE CHEST - 1 VIEW  COMPARISON:  Portable chest 10/29/2013.  FINDINGS: NG tube is in place and courses into the stomach and below the inferior margin of the film. Tip of left PICC is just within the right atrium, unchanged. Surgical drains in the upper abdomen are noted. Subsegmental atelectasis is seen in the right lung base. The left lung is clear. No pneumothorax or pleural effusion.  IMPRESSION: Mild airspace disease in the right lung base is likely subsegmental atelectasis. No acute abnormality is identified.   Electronically Signed   By: Inge Rise M.D.   On: 11/24/2013 10:24    Review of Systems  Constitutional: Positive for fever, chills and weight loss.  Eyes: Negative for blurred vision.  Respiratory: Negative for shortness of breath.   Cardiovascular: Negative for chest pain.  Gastrointestinal: Positive for abdominal pain. Negative for nausea and vomiting.  Genitourinary:       UOP dark for 2 days  Musculoskeletal: Negative for back pain.  Neurological: Positive for weakness and headaches. Negative for dizziness.  Psychiatric/Behavioral: Positive for depression. Negative for memory loss.    Blood pressure 132/80, pulse 120, resp. rate 12, SpO2 99.00%. Physical Exam  Constitutional: He is oriented to person, place, and time. He appears well-developed.  weak  Cardiovascular: Normal rate and regular rhythm.   No murmur heard. Respiratory: Effort normal and breath sounds normal. He has no wheezes.  GI: Soft. Bowel sounds are normal. There is tenderness.  RUQ tender  Genitourinary:  Urine in urinal at bedside is dark amber  Musculoskeletal: Normal range of motion.  Neurological: He is alert and oriented to person, place, and time.  Skin:  Skin is warm and dry.  Psychiatric: He has a normal mood and affect. His behavior is normal. Judgment and thought content normal.     Assessment/Plan Cholecystitis RUQ pain; fever; leukocytosis No surgery secondary many recent abd surgeries Now scheduled for perc chole drain Pt aware of procedure benefits and risks and agreeable to proceed Consent signed and in chart INR 2.5 today Lovenox held today On Meropenum and Flagyl  Ko Bardon A 11/25/2013, 9:10 AM

## 2013-11-25 NOTE — Progress Notes (Signed)
Patient ID: Pamalee LeydenChristopher D Meriwether, male   DOB: 02/27/1966, 48 y.o.   MRN: [id number]   Pt scheduled for percutaneous chole drain today in IR INR 2.5 today Not on coumadin  Discussed with Dr Grace IsaacWatts Will transfuse 2 u FFP now Orders in  Check stat PT after 1st unit If INR trending down we will hang 2nd unit on call to IR

## 2013-11-25 NOTE — Procedures (Signed)
Successful US and fluoroscopic guided placement of 10 Fr cholecystotomy tube.  No immediate complications. 

## 2013-11-26 LAB — DIGOXIN LEVEL: DIGOXIN LVL: 1.3 ng/mL (ref 0.8–2.0)

## 2013-11-26 LAB — CBC
HCT: 21.5 % — ABNORMAL LOW (ref 39.0–52.0)
HCT: 28 % — ABNORMAL LOW (ref 39.0–52.0)
HEMOGLOBIN: 7.1 g/dL — AB (ref 13.0–17.0)
HEMOGLOBIN: 9.4 g/dL — AB (ref 13.0–17.0)
MCH: 29.6 pg (ref 26.0–34.0)
MCH: 29.7 pg (ref 26.0–34.0)
MCHC: 33 g/dL (ref 30.0–36.0)
MCHC: 33.6 g/dL (ref 30.0–36.0)
MCV: 89.6 fL (ref 78.0–100.0)
MCV: 88.3 fL (ref 78.0–100.0)
Platelets: 351 10*3/uL (ref 150–400)
Platelets: 342 10*3/uL (ref 150–400)
RBC: 2.4 MIL/uL — AB (ref 4.22–5.81)
RBC: 3.17 MIL/uL — ABNORMAL LOW (ref 4.22–5.81)
RDW: 14.4 % (ref 11.5–15.5)
RDW: 14.2 % (ref 11.5–15.5)
WBC: 10.9 10*3/uL — ABNORMAL HIGH (ref 4.0–10.5)
WBC: 9 10*3/uL (ref 4.0–10.5)

## 2013-11-26 LAB — PREPARE FRESH FROZEN PLASMA
UNIT DIVISION: 0
Unit division: 0

## 2013-11-26 LAB — BASIC METABOLIC PANEL
BUN: 19 mg/dL (ref 6–23)
CO2: 22 mEq/L (ref 19–32)
Calcium: 7.5 mg/dL — ABNORMAL LOW (ref 8.4–10.5)
Chloride: 110 mEq/L (ref 96–112)
Creatinine, Ser: 0.67 mg/dL (ref 0.50–1.35)
GFR calc Af Amer: >90 mL/min (ref >90)
GFR calc non Af Amer: >90 mL/min (ref >90)
GLUCOSE: 136 mg/dL — AB (ref 70–99)
POTASSIUM: 2.9 meq/L — AB (ref 3.7–5.3)
SODIUM: 143 meq/L (ref 137–147)

## 2013-11-26 LAB — APTT: aPTT: 31 seconds (ref 24–37)

## 2013-11-26 LAB — PROTIME-INR
INR: 1.69 — ABNORMAL HIGH (ref 0.00–1.49)
PROTHROMBIN TIME: 19.4 s — AB (ref 11.6–15.2)

## 2013-11-26 LAB — PROCALCITONIN: PROCALCITONIN: 2.49 ng/mL

## 2013-11-27 ENCOUNTER — Encounter: Payer: Self-pay | Admitting: Physician Assistant

## 2013-11-27 DIAGNOSIS — R7989 Other specified abnormal findings of blood chemistry: Secondary | ICD-10-CM

## 2013-11-27 DIAGNOSIS — I4892 Unspecified atrial flutter: Secondary | ICD-10-CM

## 2013-11-27 LAB — CBC
HCT: 27.1 % — ABNORMAL LOW (ref 39.0–52.0)
Hemoglobin: 9.1 g/dL — ABNORMAL LOW (ref 13.0–17.0)
MCH: 29.5 pg (ref 26.0–34.0)
MCHC: 33.6 g/dL (ref 30.0–36.0)
MCV: 88 fL (ref 78.0–100.0)
PLATELETS: 343 10*3/uL (ref 150–400)
RBC: 3.08 MIL/uL — AB (ref 4.22–5.81)
RDW: 14.4 % (ref 11.5–15.5)
WBC: 7.7 10*3/uL (ref 4.0–10.5)

## 2013-11-27 LAB — BASIC METABOLIC PANEL
BUN: 15 mg/dL (ref 6–23)
CO2: 21 mEq/L (ref 19–32)
Calcium: 7.8 mg/dL — ABNORMAL LOW (ref 8.4–10.5)
Chloride: 105 mEq/L (ref 96–112)
Creatinine, Ser: 0.59 mg/dL (ref 0.50–1.35)
GFR calc Af Amer: >90 mL/min (ref >90)
GLUCOSE: 127 mg/dL — AB (ref 70–99)
Potassium: 3.2 mEq/L — ABNORMAL LOW (ref 3.7–5.3)
SODIUM: 139 meq/L (ref 137–147)

## 2013-11-27 LAB — MAGNESIUM
MAGNESIUM: 2 mg/dL (ref 1.5–2.5)
MAGNESIUM: 2 mg/dL (ref 1.5–2.5)

## 2013-11-27 LAB — PROCALCITONIN: Procalcitonin: 1.7 ng/mL

## 2013-11-27 NOTE — Consult Note (Signed)
Cardiology Consultation Note  Patient ID: James Mccormick, MRN: [id number], DOB/AGE: [birth year] 50 y.o. Admit date: 10/14/2013   Date of Consult: 11/27/2013 Primary Physician: No PCP Per Patient Primary Cardiologist: seen by Dr. Gala Romney and All[facility] last admission  Chief Complaint: stomach pain Reason for Consult: recurrence of atrial fibrillation (noted transiently during last admission in setting of complex hospitalization for perforated stomach ulcer with major complications)  HPI: James Mccormick is a 48 y/o previously healthy M with history of HTN and GERD whom we are asked to see for recurrence of atrial arrhythmias. He was admitted 08/28/13 - 10/14/13 perforated stomach ulcer due to NSAID use. His admission was very complex. He underwent exploratory laparotomy and repair of ulcer on 08/28/13. Hospitalization was complicated by persistent peritonitis, septic shock requiring pressors, ARDS/VDRF, AKI, acute encephalopathy, protein-calorie malnutrition, anemia of critical illness, abdominal abscess with yeast requiring micafungin and C Diff treated with Flagyl. From cardiac standpoint he developed AF RVR associated with worsening hypotension on 08/29/13 - he underwent attempted DCCV but this was unsuccessful. He was treated with amiodarone with spontaneous conversion to NSR. Afib was felt driven by medical illness and he was not felt to be a candidate for anticoagulation at that time. 2D echo showed EF 40-45%, grade 1 diastolic dysfunction. Troponins neg x 2 then 1.22. This was felt due to demand ischemia. The patient denies any significant cardiac symptoms or history preceding this admission.  Since discharge to [facility], he has had further diagnoses of right IJ DVT (Vasc US 11/04/13), and cholecystitis without ductal dilitation (fever, elevated WBC, + HIDA). He is not felt to be a surgical candidate secondary to recent abd surgeries, abscess and drains. On 4/7, INR was noted to be 2.5 and he is not on Coumadin thus  was treated with FFP and Vit K for abx induced Vit K deficiency. He underwent percutaneous cholecystostomy drain placement yesterday (4/8). Most recent labs available show most recent INR 1.69 (yesterday AM), K+ 3.2 -> being repleted with KCl hanging at bedside, Ca 7.8, glu 127, Hgb 9.1 (7.1 two days ago). BCx 4/6 still NTD. Hepatic function panel on 4/6 was abnormal (elevated AP 500 range, albumin 2.2, AST 89, ALT 64, TBili 2.0). He is receiving TPN and is NPO for now.  By review of chart, his recurrence of atrial arrhythmias began 4/5. EKG shows an SVT, question atrial flutter. From telemetry available 4/7 on, he appears to have been in atrial flutter vs coarse AF, with transition to AF then conversion to NSR around 4am this morning. Lovenox was started 70 SQ BID on 4/7 (see above regarding anticoagulation). Between 4/5 and now he has been started on digoxin (with load on 4/6), level 1.3 yesterday, amiodarone bolus then drip started 4/6, and IV Lopressor. He was previously on diltiazem but per nursing is no longer on this (not hanging at bedside either).   The patient reports that he actually feels "great." Belly pain has improved. Nausea is improving. Denies CP or SOB. He did report feeling palpitations the other day when EKG was performed.    Past Medical History  Diagnosis Date  . GERD (gastroesophageal reflux disease)   . Hypertension   . Depression   . Perforated, severe stomach ulcer   . Sepsis   . Acute respiratory failure   . Acute kidney injury   . Acute encephalopathy   . Protein-calorie malnutrition   . C. difficile colitis   . Abdominal abscess   . Anemia   . Atrial  fibrillation     a. Documented 08/2013 in setting of severe illness, amiodarone used. Not felt to be anticoag candidate at that time.  . LV dysfunction     a. 08/2013: EF 45% setting of severe illness.  . Elevated troponin     a. 08/2013: felt due to demand ischemia in setting of severe illness.  Marland Kitchen. DVT (deep venous  thrombosis)   . Cholecystitis       Most Recent Cardiac Studies: 2D Echo 08/29/13 - Left ventricle: The cavity size was normal. Wall thickness was increased in a pattern of mild LVH. Systolic function was mildly to moderately reduced. The estimated ejection fraction was in the range of 40% to 45%. Diffuse hypokinesis. Doppler parameters are consistent with abnormal left ventricular relaxation (grade 1 diastolic dysfunction). - Aortic valve: Poorly visualized. Probably trileaflet; mildly calcified leaflets. There was no stenosis. - Mitral valve: No significant regurgitation. - Right ventricle: The cavity size was normal. Systolic function was mildly reduced. - Pulmonary arteries: No complete TR doppler jet so unable to estimate PA systolic pressure. Impressions: - Technically difficult study with very poor acoustic windows. Sinus tachycardia. Normal LV size with mild LV hypertrophy. Probably diffuse hypokinesis with EF estimate 40-45%. RV poorly visualized, probably mildly decreased systolic function. Would repeat study when HR is lower. Would use Definity.    Surgical History:  Past Surgical History  Procedure Laterality Date  . Neck surgery    . Laparotomy N/A 08/28/2013    Procedure: EXPLORATORY LAPAROTOMY,debridment of nacrotic stomach,and primary repair of perforated stomach.;  Surgeon: Atilano InaEric M Wilson, MD;  Location: [facility] OR;  Service: General;  Laterality: N/A;     Home Meds: Prior to Admission medications   Not on File    Inpatient Medications:  Clinimix IV (amino acid in dextrose) Infuvite IV Intralipid IV Meropenem IV Novolog SSI Atorvastatin 20mg  daily (may not be getting as he is NPO) Biotene Digoxin 0.25mg  IV daily Fentanyl 25mcg q72 hr Lovenox 70mg  SQ BID Reglan Lopressor 5mg  IV q4hr Protonix 40mg  IV daily Sevelemer 800mg  TID Transdermal scopolamine patch   Allergies: No Known Allergies  History   Social History  . Marital Status: Divorced     Spouse Name: N/A    Number of Children: N/A  . Years of Education: N/A   Occupational History  . Not on file.   Social History Main Topics  . Smoking status: Former Smoker -- 1.00 packs/day    Types: Cigarettes  . Smokeless tobacco: Not on file     Comment: Quit 7 weeks before adm in 08/2013  . Alcohol Use: No  . Drug Use: No  . Sexual Activity: Not on file   Other Topics Concern  . Not on file   Social History Narrative  . No narrative on file     Family History  Problem Relation Age of Onset  . Mental illness Mother   . Hypertension Sister   . Hypertension Brother      Review of Systems:see above. All other systems reviewed and are otherwise negative except as noted above.  Labs:  Lab Results  Component Value Date   WBC 7.7 11/27/2013   HGB 9.1* 11/27/2013   HCT 27.1* 11/27/2013   MCV 88.0 11/27/2013   PLT 343 11/27/2013    Recent Labs Lab 11/24/13 0500  11/27/13 0934  NA 139  < > 139  K 3.4*  < > 3.2*  CL 101  < > 105  CO2 24  < > 21  BUN 19  < > 15  CREATININE 0.65  < > 0.59  CALCIUM 8.6  < > 7.8*  PROT 6.7  --   --   BILITOT 2.0*  --   --   ALKPHOS 564*  --   --   ALT 64*  --   --   AST 89*  --   --   GLUCOSE 166*  < > 127*  < > = values in this interval not displayed. Lab Results  Component Value Date   TRIG 155* 10/13/2013   Radiology/Studies:  Ct Abdomen W Contrast 11/06/2013   CLINICAL DATA:  Upper GI leaking fistula  EXAM: CT ABDOMEN WITH CONTRAST  TECHNIQUE: Multidetector CT imaging of the abdomen was performed using the standard protocol following bolus administration of intravenous contrast.  CONTRAST:  80mL OMNIPAQUE IOHEXOL 300 MG/ML  SOLN  COMPARISON:  CT ABD/PELVIS W CM dated 11/03/2013  FINDINGS: There are 2 percutaneous drains well than the epigastric region. No organized fluid collections associated with these drainage catheters. There is a fluid collection beneath the left hemidiaphragm with scattered pockets of gas which is similar in volume  measuring 8.0 x 5 4.7 cm. No new fluid collections in the abdomen.  There are 2 fluid collections along the greater curvature of stomach. One measures 24 mm in the second 21 mm compared to 27 and 21 mm on prior.  There is an NG tube extending to the stomach. Contrast flows into the small bowel colon without evidence of leak.  Small bilateral pleural effusions.  No evidence pneumonia.  Liver is normal. The gallbladder is mildly distended to 5 cm. No evidence of gallbladder inflammation.  Pancreas, spleen, adrenal glands, kidneys are normal.  IMPRESSION: 1. Fluid collection with associated gas locules in the left upper quadrant beneath the hemidiaphragms are not changed from prior. 2. Percutaneous drainage tubes in the epigastric region are not changed from prior. 3. No evidence of new fluid collections. 4. No evidence of bowel obstruction. 5. Small fluid collections beneath the greater curvature the stomach are unchanged.   Electronically Signed   By: Genevive Bi M.D.   On: 11/06/2013 14:22   Nm Hepatobiliary Liver Func 11/24/2013   CLINICAL DATA:  Abnormal gallbladder on CT  EXAM: NUCLEAR MEDICINE HEPATOBILIARY IMAGING  TECHNIQUE: Sequential images of the abdomen were obtained out to 60 minutes following intravenous administration of radiopharmaceutical.  RADIOPHARMACEUTICALS:  5.0 mCi Tc-40m Choletec  COMPARISON:  None.  FINDINGS: Following the intravenous administration of the radiopharmaceutical there is uniform tracer uptake by the liver. Delayed clearance of the radiotracer from the blood pool is identified. After 60 min of dynamic imaging no radiotracer activity is identified within the common bile duct, gallbladder or small bowel loops. Imaging was continued for 120 min without evidence for activity within the gallbladder, cystic duct or small-bowel. Persistent blood pool activity is identified on the delayed images.  IMPRESSION: 1. No persistent radiotracer activity within the liver and blood pool  without excretion of contrast into the biliary system. Findings may reflect sequelae of high-grade common bile duct obstruction or intrinsic hepatic dysfunction. 2. Results were discussed in person at the time of interpretation on 11/24/2013 at 4:53 PM to Dr. Shauna Hugh , who verbally acknowledged these results.   Electronically Signed   By: Signa Kell M.D.   On: 11/24/2013 18:20   US Soft Tissue Head/neck 10/29/2013   CLINICAL DATA Right neck swelling  EXAM ULTRASOUND OF HEAD/NECK SOFT TISSUES  TECHNIQUE Ultrasound examination  of the head and neck soft tissues was performed in the area of clinical concern in the right neck.  COMPARISON None.  FINDINGS There is echogenic thrombus in the right jugular vein. This area is painful with swelling. There appears to be a catheter in the right jugular vein on some of the images however the most recent chest x-ray shows a right arm PICC tip in place. Followup chest x-ray may be helpful to document placement of a right jugular catheter.  Right upper neck lymph nodes are present measuring approximately 5-7 mm in short axis dimension.  IMPRESSION Thrombus in the right internal jugular vein. There appears to be a catheter in the right jugular vein. Correlate with line placement.  Mild cervical adenopathy on the right, likely reactive.  SIGNATURE  Electronically Signed   By: Marlan Palau M.D.   On: 10/29/2013 09:49   Ct Abdomen Pelvis W Contrast 11/23/2013   CLINICAL DATA:  Abdominal pain and vomiting.  Known abscesses.  EXAM: CT ABDOMEN AND PELVIS WITH CONTRAST  TECHNIQUE: Multidetector CT imaging of the abdomen and pelvis was performed using the standard protocol following bolus administration of intravenous contrast.  CONTRAST:  OMNIPAQUE IOHEXOL 300 MG/ML  SOLN  COMPARISON:  11/19/2013 and 11/06/2013  FINDINGS: The lung bases demonstrate consolidation right lower lobe without significant change likely atelectasis. Very minimal posterior dependent left basilar  atelectasis. Central venous catheter tip noted of the cavoatrial junction  Abdominal images demonstrate a nasogastric tube with tip over the proximal stomach in the left upper quadrant. Left anterior percutaneous drainage catheter without significant change with tip over the epigastric region in the midline. Right anterior percutaneous drainage catheter with tip unchanged over the midline adjacent the lateral segment of the left lobe of the liver. The there are few tiny flecks of free air over the upper abdomen slightly improved. The liver, spleen, pancreas and adrenal glands are within normal. Gallbladder is slightly distended measuring 12.6 cm an launch diameter with interval development of adjacent inflammatory change and wall enhancement. No definite gallstones or ductal stones visualized. No ductal dilatation. There is a small amount of perihepatic fluid slightly worse.  There is continued evidence of multiple small fluid collections throughout the abdomen without overall change compared to the previous exam likely multiple small abscesses. Largest collections are over the left upper quadrant measuring 2.6 x 6.1 cm as well as over the pelvis measuring 4 x 4.8 cm and 3.9 x 4.5 cm respectively.  Kidneys are normal in size with 2 mm stones over the lower poles bilaterally. There is no significant hydronephrosis. There is a sub cm hypodensity over the upper pole of the right kidney too small to characterize but likely a cyst and unchanged. The right ureter is normal. There is continued evidence of a 4-5 mm stone over the proximal left ureter unchanged. There is a 2 mm stone over the left posterior dependent portion of the bladder likely recently passed. There is mild diverticulosis of the colon. Appendix is normal.  There is degenerative changes of the spine and hips.  IMPRESSION: Multiple small fluid collections throughout the abdomen and pelvis compatible with known abscesses as these are overall without  significant change from 11/19/2013. Large is collections are over the left upper quadrant measuring 2.6 x 6.1 cm and over the pelvis measuring 4 x 4.8 cm and 3.9 x 4.5 cm respectively. Bilateral upper anterior abdominal percutaneous drainage catheters with tips over the midline upper abdomen without significant change. Few tiny flecks  of free air over the upper abdomen improved and likely related to catheter insertion.  Dilatation of the gallbladder measuring 12.6 cm in diameter with wall enhancement and moderate interval development of adjacent inflammatory change. No definite stones or ductal dilatation present. Recommend clinical correlation for cholecystitis.  Bilateral nephrolithiasis. No change in a 4-5 mm stone over the proximal left ureter without obstruction. 2 mm stone within the left posterior dependent portion of the bladder.  Bibasilar atelectasis.  Diverticulosis.  Sub cm right renal hypodensity too small to characterize but likely a cyst and unchanged.  Remaining tubes and lines as described.   Electronically Signed   By: Elberta Fortis M.D.   On: 11/23/2013 17:46  Dg Chest Port 1 View 11/24/2013   CLINICAL DATA:  Abdominal abscess and fistula.  Weakness.  EXAM: PORTABLE CHEST - 1 VIEW  COMPARISON:  Portable chest 10/29/2013.  FINDINGS: NG tube is in place and courses into the stomach and below the inferior margin of the film. Tip of left PICC is just within the right atrium, unchanged. Surgical drains in the upper abdomen are noted. Subsegmental atelectasis is seen in the right lung base. The left lung is clear. No pneumothorax or pleural effusion.  IMPRESSION: Mild airspace disease in the right lung base is likely subsegmental atelectasis. No acute abnormality is identified.   Electronically Signed   By: Drusilla Kanner M.D.   On: 11/24/2013 10:24  Dg Abd Portable 1v 11/22/2013   CLINICAL DATA:  NG tube placement.  EXAM: PORTABLE ABDOMEN - 1 VIEW  COMPARISON:  11/13/2013.  FINDINGS: The NG tube tip  is in the body region of the stomach along the lateral wall. Drainage catheters are in place. The bowel gas pattern is unremarkable. No obvious free air.  IMPRESSION: NG tube is in the body region of the stomach.   Electronically Signed   By: Loralie Champagne M.D.   On: 11/22/2013 13:17   EKG: 11/23/13: SVT ? Atrial flutter appearance but somewhat fast for flutter - 187bpm, LVH with repol abnormality   Physical Exam: Temp 99.6, P92, BP 143/84, RR 16, POx 97% [place] [facility]: Well developed, well nourished WM in no acute distress. NGT in place Head: Normocephalic, atraumatic, sclera non-icteric, no xanthomas, nares are without discharge.  Neck: Negative for carotid bruits. JVD not elevated. Lungs: Slightly decreased air movement diffusely. No wheezes, rales, or rhonchi. Breathing is unlabored. Heart: RRR with S1 S2. No murmurs, rubs, or gallops appreciated. Abdomen: Soft, non-tender, non-distended with normoactive bowel sounds. No hepatomegaly. No rebound/guarding. No obvious abdominal masses. Msk:  Strength and tone appear normal for age. Extremities: No clubbing or cyanosis. No edema.  Distal pedal pulses are 2+ and equal bilaterally. Neuro: Alert and oriented X 3. No facial asymmetry. No focal deficit. Moves all extremities spontaneously. Psych:  Responds to questions appropriately with a normal affect.   Assessment and Plan:   1. Perforated stomach ulcer 08/2013 c/b VDRF, septic shock, ARDS, encephalopathy, AKI, anemia of critical illness, abdominal abscesses, and C-diff colitis 2. Cholecystitis s/p perc drain 11/26/13 3. Right IV DVT dx 10/2013, possibly line related 4. Autoanticoagulation with elevated INR despite not being on Coumadin 5. Atrial arrhythmias - suspect paroxysmal atrial flutter/fibrillation 6. Elevated troponin last admission and LV dysfunction EF 45%, felt due to acute illness  At this point he has converted to NSR. Will obtain EKG to document this. Continue IV metoprolol - can  increase if we need to as his pressure is good. Continue IV amiodarone.  We tentatively plan to convert these to oral form once he is taking POs again. Will discontinue digoxin as he has adequate pressure to titrate rate meds if needed, and because of possible interaction with amiodarone. He is not currently on diltiazem so will write to discontinue order from [facility]. Will discuss anticoag with MD.  Althia Forts Dunn PA-C 11/27/2013, 5:07 PM   Patient seen, examined. Available data reviewed. Agree with findings, assessment, and plan as outlined by Ronie Spies, PA-C. Exam reveals a pleasant male in no acute distress. There is an NG tube in place. HEENT is otherwise unremarkable. Jugular venous pressure is normal. Heart is regular rate and rhythm without murmur or gallop. Lung fields are clear. There is no peripheral edema.  EKG is reviewed and demonstrate what I think is atrial flutter with RVR. The patient's heart rate which has been as high as about 185-190 beats per minute would argue against atrial flutter with 2:1 block. However, there appear to be clear flutter waves and I suspect he has a fast atrial flutter rate. He has converted to sinus rhythm on IV amiodarone. I agree with the plans as outlined above with discontinuation of IV digoxin. We will continue IV amiodarone and IV metoprolol until the patient is taking oral medications and then will convert him to an oral regimen. Considering all of his intra-abdominal issues with a perforated ulcer and now acute cholecystitis, I think the overall risk/benefit of systemic anticoagulation is probably unfavorable. Would recommend reducing Lovenox to DVT prophylaxis dose unless the patient has been anticoagulated for some other reason than atrial flutter. Hopefully he will maintain sinus rhythm on amiodarone and metoprolol. We will follow along with you. Will recheck thyroid function tests as well. Thank you.  Tonny Bollman, M.D. 11/27/2013 5:36 PM

## 2013-11-28 LAB — BASIC METABOLIC PANEL
BUN: 13 mg/dL (ref 6–23)
CHLORIDE: 106 meq/L (ref 96–112)
CO2: 24 mEq/L (ref 19–32)
Calcium: 8.1 mg/dL — ABNORMAL LOW (ref 8.4–10.5)
Creatinine, Ser: 0.53 mg/dL (ref 0.50–1.35)
GFR calc Af Amer: >90 mL/min (ref >90)
Glucose, Bld: 117 mg/dL — ABNORMAL HIGH (ref 70–99)
POTASSIUM: 3.1 meq/L — AB (ref 3.7–5.3)
SODIUM: 141 meq/L (ref 137–147)

## 2013-11-28 LAB — T4, FREE
FREE T4: 1.27 ng/dL (ref 0.80–1.80)
FREE T4: 1.24 ng/dL (ref 0.80–1.80)

## 2013-11-28 LAB — TSH: TSH: 1.63 u[IU]/mL (ref 0.350–4.500)

## 2013-11-28 LAB — MAGNESIUM: Magnesium: 1.9 mg/dL (ref 1.5–2.5)

## 2013-11-29 LAB — BASIC METABOLIC PANEL
BUN: 11 mg/dL (ref 6–23)
CO2: 25 meq/L (ref 19–32)
CREATININE: 0.58 mg/dL (ref 0.50–1.35)
Calcium: 8.3 mg/dL — ABNORMAL LOW (ref 8.4–10.5)
Chloride: 102 mEq/L (ref 96–112)
GFR calc non Af Amer: >90 mL/min (ref >90)
Glucose, Bld: 86 mg/dL (ref 70–99)
Potassium: 3.2 mEq/L — ABNORMAL LOW (ref 3.7–5.3)
Sodium: 139 mEq/L (ref 137–147)

## 2013-11-29 LAB — CBC WITH DIFFERENTIAL/PLATELET
Basophils Absolute: 0 10*3/uL (ref 0.0–0.1)
Basophils Relative: 0 % (ref 0–1)
Eosinophils Absolute: 0.2 10*3/uL (ref 0.0–0.7)
Eosinophils Relative: 2 % (ref 0–5)
HCT: 28.5 % — ABNORMAL LOW (ref 39.0–52.0)
Hemoglobin: 9.5 g/dL — ABNORMAL LOW (ref 13.0–17.0)
Lymphocytes Relative: 26 % (ref 12–46)
Lymphs Abs: 2.6 10*3/uL (ref 0.7–4.0)
MCH: 29.1 pg (ref 26.0–34.0)
MCHC: 33.3 g/dL (ref 30.0–36.0)
MCV: 87.2 fL (ref 78.0–100.0)
MONO ABS: 0.9 10*3/uL (ref 0.1–1.0)
Monocytes Relative: 9 % (ref 3–12)
NEUTROS PCT: 63 % (ref 43–77)
Neutro Abs: 6.2 10*3/uL (ref 1.7–7.7)
PLATELETS: 430 10*3/uL — AB (ref 150–400)
RBC: 3.27 MIL/uL — ABNORMAL LOW (ref 4.22–5.81)
RDW: 14.5 % (ref 11.5–15.5)
WBC: 9.9 10*3/uL (ref 4.0–10.5)

## 2013-11-29 LAB — MAGNESIUM: Magnesium: 1.8 mg/dL (ref 1.5–2.5)

## 2013-11-29 LAB — PHOSPHORUS: PHOSPHORUS: 4 mg/dL (ref 2.3–4.6)

## 2013-11-30 LAB — CBC WITH DIFFERENTIAL/PLATELET
BASOS ABS: 0 10*3/uL (ref 0.0–0.1)
Basophils Relative: 0 % (ref 0–1)
EOS PCT: 2 % (ref 0–5)
Eosinophils Absolute: 0.2 10*3/uL (ref 0.0–0.7)
HCT: 28.4 % — ABNORMAL LOW (ref 39.0–52.0)
Hemoglobin: 9.3 g/dL — ABNORMAL LOW (ref 13.0–17.0)
LYMPHS PCT: 27 % (ref 12–46)
Lymphs Abs: 2.7 10*3/uL (ref 0.7–4.0)
MCH: 28.8 pg (ref 26.0–34.0)
MCHC: 32.7 g/dL (ref 30.0–36.0)
MCV: 87.9 fL (ref 78.0–100.0)
Monocytes Absolute: 1.1 10*3/uL — ABNORMAL HIGH (ref 0.1–1.0)
Monocytes Relative: 11 % (ref 3–12)
NEUTROS PCT: 60 % (ref 43–77)
Neutro Abs: 6.1 10*3/uL (ref 1.7–7.7)
PLATELETS: 423 10*3/uL — AB (ref 150–400)
RBC: 3.23 MIL/uL — ABNORMAL LOW (ref 4.22–5.81)
RDW: 14.9 % (ref 11.5–15.5)
WBC: 10.1 10*3/uL (ref 4.0–10.5)

## 2013-11-30 LAB — BASIC METABOLIC PANEL
BUN: 12 mg/dL (ref 6–23)
CHLORIDE: 104 meq/L (ref 96–112)
CO2: 26 mEq/L (ref 19–32)
Calcium: 8.3 mg/dL — ABNORMAL LOW (ref 8.4–10.5)
Creatinine, Ser: 0.6 mg/dL (ref 0.50–1.35)
Glucose, Bld: 138 mg/dL — ABNORMAL HIGH (ref 70–99)
POTASSIUM: 3.4 meq/L — AB (ref 3.7–5.3)
SODIUM: 140 meq/L (ref 137–147)

## 2013-11-30 LAB — CULTURE, BLOOD (ROUTINE X 2)
CULTURE: NO GROWTH
Culture: NO GROWTH

## 2013-12-01 LAB — CBC
HEMATOCRIT: 28.8 % — AB (ref 39.0–52.0)
HEMOGLOBIN: 9.5 g/dL — AB (ref 13.0–17.0)
MCH: 29.4 pg (ref 26.0–34.0)
MCHC: 33 g/dL (ref 30.0–36.0)
MCV: 89.2 fL (ref 78.0–100.0)
Platelets: 438 10*3/uL — ABNORMAL HIGH (ref 150–400)
RBC: 3.23 MIL/uL — ABNORMAL LOW (ref 4.22–5.81)
RDW: 15.1 % (ref 11.5–15.5)
WBC: 9 10*3/uL (ref 4.0–10.5)

## 2013-12-01 LAB — PHOSPHORUS: PHOSPHORUS: 4.4 mg/dL (ref 2.3–4.6)

## 2013-12-01 LAB — BASIC METABOLIC PANEL
BUN: 15 mg/dL (ref 6–23)
CHLORIDE: 103 meq/L (ref 96–112)
CO2: 28 mEq/L (ref 19–32)
Calcium: 8.3 mg/dL — ABNORMAL LOW (ref 8.4–10.5)
Creatinine, Ser: 0.68 mg/dL (ref 0.50–1.35)
GFR calc non Af Amer: >90 mL/min (ref >90)
GLUCOSE: 130 mg/dL — AB (ref 70–99)
POTASSIUM: 3.7 meq/L (ref 3.7–5.3)
SODIUM: 142 meq/L (ref 137–147)

## 2013-12-01 LAB — PREALBUMIN: Prealbumin: 19.1 mg/dL (ref 17.0–34.0)

## 2013-12-01 LAB — MAGNESIUM: MAGNESIUM: 2 mg/dL (ref 1.5–2.5)

## 2013-12-02 ENCOUNTER — Other Ambulatory Visit (HOSPITAL_COMMUNITY): Payer: Medicaid Other

## 2013-12-02 ENCOUNTER — Encounter: Payer: Self-pay | Admitting: Radiology

## 2013-12-02 LAB — BASIC METABOLIC PANEL
BUN: 17 mg/dL (ref 6–23)
CHLORIDE: 103 meq/L (ref 96–112)
CO2: 30 mEq/L (ref 19–32)
Calcium: 8.5 mg/dL (ref 8.4–10.5)
Creatinine, Ser: 0.75 mg/dL (ref 0.50–1.35)
GFR calc Af Amer: >90 mL/min (ref >90)
Glucose, Bld: 126 mg/dL — ABNORMAL HIGH (ref 70–99)
Potassium: 3.9 mEq/L (ref 3.7–5.3)
SODIUM: 140 meq/L (ref 137–147)

## 2013-12-02 LAB — CBC
HCT: 30.5 % — ABNORMAL LOW (ref 39.0–52.0)
HEMOGLOBIN: 9.8 g/dL — AB (ref 13.0–17.0)
MCH: 29.2 pg (ref 26.0–34.0)
MCHC: 32.1 g/dL (ref 30.0–36.0)
MCV: 90.8 fL (ref 78.0–100.0)
Platelets: 435 10*3/uL — ABNORMAL HIGH (ref 150–400)
RBC: 3.36 MIL/uL — ABNORMAL LOW (ref 4.22–5.81)
RDW: 15.1 % (ref 11.5–15.5)
WBC: 9.7 10*3/uL (ref 4.0–10.5)

## 2013-12-02 MED ORDER — IOHEXOL 300 MG/ML  SOLN
80.0000 mL | Freq: Once | INTRAMUSCULAR | Status: AC | PRN
  Administered 2013-12-02: 80 mL via INTRAVENOUS

## 2013-12-02 NOTE — Progress Notes (Addendum)
[facility]                                                                                              Progress note     Patient Demographics  James Mccormick, is a 48 y.o. male  [id number]  QIO:[id number]  DOB - 10/10/1965  Admit date - 10/14/2013  Admitting Physician Carron CurieAli Nyleah Mcginnis, MD  Outpatient Primary MD for the patient is No PCP Per Patient  LOS - 49   Abdominal abscess         Subjective:   James Mccormick today has no complaints  Objective:   Vital signs  Temperature 97.8 Heart rate 81 Respiratory rate 18  Blood pressure 119/83 Pulse ox 97%    Exam Awake Alert, Oriented X 3, No new F.N deficits, depressed [place].AT,PERRAL Supple Neck,No JVD, No cervical lymphadenopathy appriciated.  Symmetrical Chest wall movement, Good air movement bilaterally, CTAB IRREGULARLY IRREGULAR,No Gallops,Rubs or new Murmurs, No Parasternal Heave Degrees B.Sounds, Abd Soft, Non tender, No organomegaly appriciated, abdominal drains noted No Cyanosis, Clubbing or edema, No new Rash or bruise    I&Os  2904/2925  Data Review   CBC  Recent Labs Lab 11/27/13 0934 11/29/13 0744 11/30/13 0624 12/01/13 0500 12/02/13 0705  WBC 7.7 9.9 10.1 9.0 9.7  HGB 9.1* 9.5* 9.3* 9.5* 9.8*  HCT 27.1* 28.5* 28.4* 28.8* 30.5*  PLT 343 430* 423* 438* 435*  MCV 88.0 87.2 87.9 89.2 90.8  MCH 29.5 29.1 28.8 29.4 29.2  MCHC 33.6 33.3 32.7 33.0 32.1  RDW 14.4 14.5 14.9 15.1 15.1  LYMPHSABS  --  2.6 2.7  --   --   MONOABS  --  0.9 1.1*  --   --   EOSABS  --  0.2 0.2  --   --   BASOSABS  --  0.0 0.0  --   --     Chemistries   Recent Labs Lab 11/27/13 0934 11/27/13 2243 11/28/13 0600 11/29/13 0744 11/30/13 0624 12/01/13 0500 12/02/13 0705  NA 139  --  141 139 140 142 140  K 3.2*  --  3.1* 3.2* 3.4* 3.7 3.9  CL 105  --  106 102 104 103 103  CO2 21  --  24 25 26 28 30   GLUCOSE 127*  --  117* 86  138* 130* 126*  BUN 15  --  13 11 12 15 17   CREATININE 0.59  --  0.53 0.58 0.60 0.68 0.75  CALCIUM 7.8*  --  8.1* 8.3* 8.3* 8.3* 8.5  MG 2.0 2.0 1.9 1.8  --  2.0  --    Coagulation profile  Recent Labs Lab 11/25/13 1340 11/26/13 0500  INR 1.72* 1.69*    No results found for this basename: DDIMER,  in the last 72 hours  Cardiac Enzymes No results found for this basename: CK, CKMB, TROPONINI, MYOGLOBIN,  in the last 168 hours ------------------------------------------------------------------------------------------------------------------ No components found with this basename: POCBNP,   Micro Results Recent Results (from the past 240 hour(s))  URINE CULTURE     Status: None   Collection Time    11/24/13 10:20  AM      Result Value Ref Range Status   Specimen Description URINE, RANDOM   Final   Special Requests Normal   Final   Culture  Setup Time     Final   Value: 11/24/2013 17:58     Performed at [facility]   Colony Count     Final   Value: NO GROWTH     Performed at [facility]   Culture     Final   Value: NO GROWTH     Performed at [facility]   Report Status 11/25/2013 FINAL   Final  CULTURE, BLOOD (ROUTINE X 2)     Status: None   Collection Time    11/24/13 12:10 PM      Result Value Ref Range Status   Specimen Description BLOOD RIGHT ANTECUBITAL   Final   Special Requests BOTTLES DRAWN AEROBIC AND ANAEROBIC 5CC   Final   Culture  Setup Time     Final   Value: 11/24/2013 16:06     Performed at [facility]   Culture     Final   Value: NO GROWTH 5 DAYS     Performed at [facility]   Report Status 11/30/2013 FINAL   Final  CULTURE, BLOOD (ROUTINE X 2)     Status: None   Collection Time    11/24/13 12:15 PM      Result Value Ref Range Status   Specimen Description BLOOD RIGHT [place]   Final   Special Requests BOTTLES DRAWN AEROBIC ONLY 4CC   Final   Culture  Setup Time     Final   Value: 11/24/2013 16:06      Performed at [facility]   Culture     Final   Value: NO GROWTH 5 DAYS     Performed at [facility]   Report Status 11/30/2013 FINAL   Final       Assessment & Plan   Abdominal abscess status post perforated gastric ulcer, status post abdominal drains noted Abdomen wound with intra-abdominal fistula Generalized weakness continue with PT OT as tolerated Protein calorie malnutrition on TPN A. fib with controlled rate coronary artery disease status post NSTEMI Depression GERD History of C. difficile colitis Right IJ thrombus Acute on chronic pain syndrome Nausea  cholecystitis status post drained by IR stable  Plan  Continue same treatment   Code Status: Full  DVT Prophylaxis   Lovenox   Carron CurieAli Dayshon Roback M.D on 12/02/2013 at 12:19 PM

## 2013-12-03 ENCOUNTER — Encounter: Payer: Self-pay | Admitting: Radiology

## 2013-12-03 NOTE — Progress Notes (Signed)
[facility]                                                                                              Progress note     Patient Demographics  James Mccormick, is a 48 y.o. male  ZOX:[id number]  WJX:[id number]  DOB - 04/29/1966  Admit date - 10/14/2013  Admitting Physician Carron CurieAli Giacomo Valone, MD  Outpatient Primary MD for the patient is No PCP Per Patient  LOS - 50   Abdominal abscess         Subjective:   James Mccormick today has no complaints  Objective:   Vital signs  Temperature 97.7 Heart rate 77 Respiratory rate 20  Blood pressure 114/73 Pulse ox 96%    Exam Awake Alert, Oriented X 3, No new F.N deficits, depressed [place].AT,PERRAL Supple Neck,No JVD, No cervical lymphadenopathy appriciated.  Symmetrical Chest wall movement, Good air movement bilaterally, CTAB IRREGULARLY IRREGULAR,No Gallops,Rubs or new Murmurs, No Parasternal Heave Degrees B.Sounds, Abd Soft, Non tender, No organomegaly appriciated, abdominal drains noted No Cyanosis, Clubbing or edema, No new Rash or bruise    I&Os  2965/2229  Data Review   CBC  Recent Labs Lab 11/27/13 0934 11/29/13 0744 11/30/13 0624 12/01/13 0500 12/02/13 0705  WBC 7.7 9.9 10.1 9.0 9.7  HGB 9.1* 9.5* 9.3* 9.5* 9.8*  HCT 27.1* 28.5* 28.4* 28.8* 30.5*  PLT 343 430* 423* 438* 435*  MCV 88.0 87.2 87.9 89.2 90.8  MCH 29.5 29.1 28.8 29.4 29.2  MCHC 33.6 33.3 32.7 33.0 32.1  RDW 14.4 14.5 14.9 15.1 15.1  LYMPHSABS  --  2.6 2.7  --   --   MONOABS  --  0.9 1.1*  --   --   EOSABS  --  0.2 0.2  --   --   BASOSABS  --  0.0 0.0  --   --     Chemistries   Recent Labs Lab 11/27/13 0934 11/27/13 2243 11/28/13 0600 11/29/13 0744 11/30/13 0624 12/01/13 0500 12/02/13 0705  NA 139  --  141 139 140 142 140  K 3.2*  --  3.1* 3.2* 3.4* 3.7 3.9  CL 105  --  106 102 104 103 103  CO2 21  --  24 25 26 28 30   GLUCOSE 127*  --  117* 86  138* 130* 126*  BUN 15  --  13 11 12 15 17   CREATININE 0.59  --  0.53 0.58 0.60 0.68 0.75  CALCIUM 7.8*  --  8.1* 8.3* 8.3* 8.3* 8.5  MG 2.0 2.0 1.9 1.8  --  2.0  --    Coagulation profile No results found for this basename: INR, PROTIME,  in the last 168 hours  No results found for this basename: DDIMER,  in the last 72 hours  Cardiac Enzymes No results found for this basename: CK, CKMB, TROPONINI, MYOGLOBIN,  in the last 168 hours ------------------------------------------------------------------------------------------------------------------ No components found with this basename: POCBNP,   Micro Results Recent Results (from the past 240 hour(s))  URINE CULTURE     Status: None   Collection Time    11/24/13 10:20  AM      Result Value Ref Range Status   Specimen Description URINE, RANDOM   Final   Special Requests Normal   Final   Culture  Setup Time     Final   Value: 11/24/2013 17:58     Performed at [facility]   Colony Count     Final   Value: NO GROWTH     Performed at [facility]   Culture     Final   Value: NO GROWTH     Performed at [facility]   Report Status 11/25/2013 FINAL   Final  CULTURE, BLOOD (ROUTINE X 2)     Status: None   Collection Time    11/24/13 12:10 PM      Result Value Ref Range Status   Specimen Description BLOOD RIGHT ANTECUBITAL   Final   Special Requests BOTTLES DRAWN AEROBIC AND ANAEROBIC 5CC   Final   Culture  Setup Time     Final   Value: 11/24/2013 16:06     Performed at [facility]   Culture     Final   Value: NO GROWTH 5 DAYS     Performed at [facility]   Report Status 11/30/2013 FINAL   Final  CULTURE, BLOOD (ROUTINE X 2)     Status: None   Collection Time    11/24/13 12:15 PM      Result Value Ref Range Status   Specimen Description BLOOD RIGHT [place]   Final   Special Requests BOTTLES DRAWN AEROBIC ONLY 4CC   Final   Culture  Setup Time     Final   Value: 11/24/2013 16:06      Performed at [facility]   Culture     Final   Value: NO GROWTH 5 DAYS     Performed at [facility]   Report Status 11/30/2013 FINAL   Final       Assessment & Plan   Abdominal abscess status post perforated gastric ulcer, status post abdominal drains noted Abdomen wound with intra-abdominal fistula Generalized weakness continue with PT OT as tolerated Protein calorie malnutrition on TPN A. fib with controlled rate coronary artery disease status post NSTEMI Depression GERD History of C. difficile colitis Right IJ thrombus Acute on chronic pain syndrome Nausea Cholecystitis status post common bile duct drain  Plan  Continue same treatment Will discuss with surgery soon   Code Status: Full  DVT Prophylaxis   Lovenox   Carron CurieAli Natanael Saladin M.D on 12/03/2013 at 2:51 PM

## 2013-12-03 NOTE — H&P (Signed)
James Mccormick is an 48 y.o. male.   Pt well known to IR service. Most recent RUQ perc chole drain placed on 4/7, has been working well and pt feeling much better. Primary MD reports (L)sided surgically placed intra-abdominal drain has fallen out and he is concerned about undrained fluid collections. CT scan was done and finds persistent lower abdominal/pelvic fluid collections. IR is asked to aspiratedrain these collection if possible Imaging reviewed with Dr. Laurence Ferrari who feels aspiration at least could be attempted.   HPI: perf gastric ulcer--surgeries/abscess and long hospital course; HTN; depression; Afib;   Past Medical History  Diagnosis Date  . GERD (gastroesophageal reflux disease)   . Hypertension   . Depression   . Perforated, severe stomach ulcer   . Sepsis   . Acute respiratory failure   . Acute kidney injury   . Acute encephalopathy   . Protein-calorie malnutrition   . C. difficile colitis   . Abdominal abscess   . Anemia   . Atrial fibrillation     a. Documented 08/2013 in setting of severe illness, amiodarone used. Not felt to be anticoag candidate at that time.  . LV dysfunction     a. 08/2013: EF 45% setting of severe illness.  . Elevated troponin     a. 08/2013: felt due to demand ischemia in setting of severe illness.  Marland Kitchen DVT (deep venous thrombosis)   . Cholecystitis     Past Surgical History  Procedure Laterality Date  . Neck surgery    . Laparotomy N/A 08/28/2013    Procedure: EXPLORATORY LAPAROTOMY,debridment of nacrotic stomach,and primary repair of perforated stomach.;  Surgeon: Gayland Curry, MD;  Location: [facility] OR;  Service: General;  Laterality: N/A;    Family History  Problem Relation Age of Onset  . Mental illness Mother   . Hypertension Sister   . Hypertension Brother    Social History:  reports that he has quit smoking. His smoking use included Cigarettes. He smoked 1.00 pack per day. He does not have any smokeless tobacco history on file.  He reports that he does not drink alcohol or use illicit drugs.  Allergies: No Known Allergies  No prescriptions prior to admission    Results for orders placed during the hospital encounter of 10/14/13 (from the past 48 hour(s))  CBC     Status: Abnormal   Collection Time    12/02/13  7:05 AM      Result Value Ref Range   WBC 9.7  4.0 - 10.5 K/uL   RBC 3.36 (*) 4.22 - 5.81 MIL/uL   Hemoglobin 9.8 (*) 13.0 - 17.0 g/dL   HCT 30.5 (*) 39.0 - 52.0 %   MCV 90.8  78.0 - 100.0 fL   MCH 29.2  26.0 - 34.0 pg   MCHC 32.1  30.0 - 36.0 g/dL   RDW 15.1  11.5 - 15.5 %   Platelets 435 (*) 150 - 400 K/uL  BASIC METABOLIC PANEL     Status: Abnormal   Collection Time    12/02/13  7:05 AM      Result Value Ref Range   Sodium 140  137 - 147 mEq/L   Potassium 3.9  3.7 - 5.3 mEq/L   Chloride 103  96 - 112 mEq/L   CO2 30  19 - 32 mEq/L   Glucose, Bld 126 (*) 70 - 99 mg/dL   BUN 17  6 - 23 mg/dL   Creatinine, Ser 0.75  0.50 - 1.35 mg/dL  Calcium 8.5  8.4 - 10.5 mg/dL   GFR calc non Af Amer >90  >90 mL/min   GFR calc Af Amer >90  >90 mL/min   Comment: (NOTE)     The eGFR has been calculated using the CKD EPI equation.     This calculation has not been validated in all clinical situations.     eGFR's persistently <90 mL/min signify possible Chronic Kidney     Disease.   Ct Abdomen Pelvis W Contrast  12/02/2013   CLINICAL DATA:  Followup evaluation for abdominal abscess.  EXAM: CT ABDOMEN AND PELVIS WITH CONTRAST  TECHNIQUE: Multidetector CT imaging of the abdomen and pelvis was performed using the standard protocol following bolus administration of intravenous contrast.  CONTRAST:  71m OMNIPAQUE IOHEXOL 300 MG/ML  SOLN  COMPARISON:  CT of the abdomen and pelvis 11/23/2013.  FINDINGS: Lung Bases: Small bilateral pleural effusions with areas of dependent atelectasis in the lower lobes of the lungs bilaterally. The central line with tip terminating in the right atrium. Nasogastric tube extending into  the proximal stomach.  Abdomen/Pelvis: Percutaneous cholecystostomy tube in position. Gallbladder appears decompressed. There is an additional pigtail drainage catheter beneath the left lobe of the liver. No surrounding fluid collection at this time. Other previously noted pigtail drainage catheter to the left of the left lobe of the liver on the prior study has been removed. Stranding throughout the omentum and mesenteries of the upper abdomen. Multiple nonenlarged reactive lymph nodes throughout these regions is well. Trace volume of ascites adjacent to the right lobe of the liver. No pneumoperitoneum. No pathologic distention of small bowel. Small rim enhancing fluid collection adjacent to the tail of the pancreas measuring 1.7 x 1.6 cm (image 37 of series 2) is essentially unchanged. Rim enhancing fluid collections in the pelvis also appear similar in size, the 2 largest of which are immediately cephalad to the urinary bladder measuring approximately 5.9 x 3.7 cm on image 92 of series 2, and immediately anterior to the rectum measuring 4.2 x 3.0 cm on image 94 of series 2.  Small amount of periportal edema in the liver. No focal hepatic lesions. The appearance of the pancreas, spleen, bilateral adrenal glands and the left kidney is unremarkable. 3 mm nonobstructive calculus in the lower pole collecting system of the right kidney. Prostate gland and urinary bladder are unremarkable in appearance. Numerous colonic diverticulae are noted, most pronounced in the region of the sigmoid colon.  Musculoskeletal: Anterior abdominal wound closing via secondary intention again noted. There are no aggressive appearing lytic or blastic lesions noted in the visualized portions of the skeleton. Small sclerotic foci in the right ilium are unchanged, most compatible with small bone islands.  IMPRESSION: 1. Interval placement of percutaneous cholecystostomy tube in the gallbladder, with decompression of the gallbladder. There  continues to be some inflammatory changes around the decompressed gallbladder at this time. 2. Small rim enhancing fluid collections in the abdomen and pelvis appear very similar to the prior examination, as detailed above, compatible with small abscesses. 3. Colonic diverticulosis. 4. Additional incidental findings, similar prior studies, as above.   Electronically Signed   By: DVinnie LangtonM.D.   On: 12/02/2013 19:22    Review of Systems  Constitutional: Positive for weight loss.  Eyes: Negative for blurred vision.  Respiratory: Negative for shortness of breath.   Cardiovascular: Negative for chest pain.  Gastrointestinal: Positive for abdominal pain. Negative for nausea and vomiting.  Genitourinary:  UOP dark for 2 days  Musculoskeletal: Negative for back pain.  Neurological: Positive for weakness and headaches. Negative for dizziness.  Psychiatric/Behavioral: Positive for depression. Negative for memory loss.    Blood pressure 102/60, pulse 119, resp. rate 26, SpO2 97.00%. Physical Exam  Constitutional: He is oriented to person, place, and time. He appears well-developed.  weak  Cardiovascular: Normal rate and regular rhythm.   No murmur heard. Respiratory: Effort normal and breath sounds normal. He has no wheezes.  GI: Soft. Bowel sounds are normal. There is tenderness.  RUQ perc chole drain intact, site NT, dark green bilious output in bag.  Genitourinary:  Urine in urinal at bedside is dark amber  Musculoskeletal: Normal range of motion.  Neurological: He is alert and oriented to person, place, and time.  Skin: Skin is warm and dry.  Psychiatric: He has a normal mood and affect. His behavior is normal. Judgment and thought content normal.     Assessment/Plan Persistent intra-abdominal fluid collections worrisome for abscess No surgery secondary many recent abd surgeries. Remains on IV abx, TPN. Currently only doing sips of po clears for comfort as he is maintained  to NG suction. Explained plan for CT guided aspiration/drainage of these collections Pt aware of procedure benefits and risks and agreeable to proceed Consent signed and in chart   Ascencion Dike 12/03/2013, 4:11 PM

## 2013-12-04 ENCOUNTER — Other Ambulatory Visit (HOSPITAL_COMMUNITY): Payer: Medicaid Other

## 2013-12-04 LAB — PROTIME-INR
INR: 1.1 (ref 0.00–1.49)
Prothrombin Time: 14 seconds (ref 11.6–15.2)

## 2013-12-04 MED ORDER — FENTANYL CITRATE 0.05 MG/ML IJ SOLN
INTRAMUSCULAR | Status: AC
  Filled 2013-12-04: qty 2

## 2013-12-04 MED ORDER — MIDAZOLAM HCL 2 MG/2ML IJ SOLN
INTRAMUSCULAR | Status: AC
  Filled 2013-12-04: qty 2

## 2013-12-04 MED ORDER — FENTANYL CITRATE 0.05 MG/ML IJ SOLN
INTRAMUSCULAR | Status: AC | PRN
  Administered 2013-12-04 (×3): 25 ug via INTRAVENOUS

## 2013-12-04 MED ORDER — MIDAZOLAM HCL 2 MG/2ML IJ SOLN
INTRAMUSCULAR | Status: AC | PRN
  Administered 2013-12-04 (×2): 0.5 mg via INTRAVENOUS
  Administered 2013-12-04: 1 mg via INTRAVENOUS

## 2013-12-04 NOTE — Progress Notes (Signed)
[facility]                                                                                              Progress note     Patient Demographics  James Mccormick, is a 48 y.o. male  ZOX:[id number]  WJX:[id number]  DOB - 03/18/1966  Admit date - 10/14/2013  Admitting Physician Carron CurieAli Vail Basista, MD  Outpatient Primary MD for the patient is No PCP Per Patient  LOS - 51   Abdominal abscess         Subjective:   James Mccormick today has no new complaints  Objective:   Vital signs  Temperature 98.0 Heart rate 82 Respiratory rate 18 Blood pressure 120/77 Pulse ox 97%    Exam Awake Alert, Oriented X 3, No new F.N deficits, depressed [place].AT,PERRAL Supple Neck,No JVD, No cervical lymphadenopathy appriciated.  Symmetrical Chest wall movement, Good air movement bilaterally, CTAB IRREGULARLY IRREGULAR,No Gallops,Rubs or new Murmurs, No Parasternal Heave Degrees B.Sounds, Abd Soft, Non tender, No organomegaly appriciated, abdominal drains noted No Cyanosis, Clubbing or edema, No new Rash or bruise    I&Os  3198/2600  Data Review   CBC  Recent Labs Lab 11/29/13 0744 11/30/13 0624 12/01/13 0500 12/02/13 0705  WBC 9.9 10.1 9.0 9.7  HGB 9.5* 9.3* 9.5* 9.8*  HCT 28.5* 28.4* 28.8* 30.5*  PLT 430* 423* 438* 435*  MCV 87.2 87.9 89.2 90.8  MCH 29.1 28.8 29.4 29.2  MCHC 33.3 32.7 33.0 32.1  RDW 14.5 14.9 15.1 15.1  LYMPHSABS 2.6 2.7  --   --   MONOABS 0.9 1.1*  --   --   EOSABS 0.2 0.2  --   --   BASOSABS 0.0 0.0  --   --     Chemistries   Recent Labs Lab 11/27/13 2243 11/28/13 0600 11/29/13 0744 11/30/13 0624 12/01/13 0500 12/02/13 0705  NA  --  141 139 140 142 140  K  --  3.1* 3.2* 3.4* 3.7 3.9  CL  --  106 102 104 103 103  CO2  --  24 25 26 28 30   GLUCOSE  --  117* 86 138* 130* 126*  BUN  --  13 11 12 15 17   CREATININE  --  0.53 0.58 0.60 0.68 0.75  CALCIUM  --  8.1*  8.3* 8.3* 8.3* 8.5  MG 2.0 1.9 1.8  --  2.0  --    Coagulation profile  Recent Labs Lab 12/04/13 0415  INR 1.10    No results found for this basename: DDIMER,  in the last 72 hours  Cardiac Enzymes No results found for this basename: CK, CKMB, TROPONINI, MYOGLOBIN,  in the last 168 hours ------------------------------------------------------------------------------------------------------------------ No components found with this basename: POCBNP,   Micro Results No results found for this or any previous visit (from the past 240 hour(s)).     Assessment & Plan   Abdominal abscesses status post perforated gastric ulcer, status post abdominal drains noted. Status post aspiration of pelvic collection today Abdomen wound with intra-abdominal fistula Generalized weakness continue with PT OT as tolerated Protein calorie malnutrition on TPN A.  fib with controlled rate coronary artery disease status post NSTEMI Depression GERD History of C. difficile colitis Right IJ thrombus Acute on chronic pain syndrome Nausea Cholecystitis status post common bile duct drain  Plan Continue with IV antibiotics Discussed with IR, CT aspiration of pelvic collection done Check labs in a.m.   Code Status: Full  DVT Prophylaxis   Lovenox   Carron CurieAli Terrea Bruster M.D on 12/04/2013 at 2:41 PM

## 2013-12-04 NOTE — Procedures (Signed)
CT aspiration pelvic collection 5ml old blood returned, sent for GS, C&S No complication No blood loss. [facility].

## 2013-12-05 LAB — BASIC METABOLIC PANEL
BUN: 21 mg/dL (ref 6–23)
CO2: 26 mEq/L (ref 19–32)
CREATININE: 0.69 mg/dL (ref 0.50–1.35)
Calcium: 8.8 mg/dL (ref 8.4–10.5)
Chloride: 103 mEq/L (ref 96–112)
GFR calc Af Amer: >90 mL/min (ref >90)
GLUCOSE: 123 mg/dL — AB (ref 70–99)
Potassium: 4.2 mEq/L (ref 3.7–5.3)
Sodium: 139 mEq/L (ref 137–147)

## 2013-12-05 LAB — CBC
HCT: 30.7 % — ABNORMAL LOW (ref 39.0–52.0)
HEMOGLOBIN: 9.8 g/dL — AB (ref 13.0–17.0)
MCH: 28.9 pg (ref 26.0–34.0)
MCHC: 31.9 g/dL (ref 30.0–36.0)
MCV: 90.6 fL (ref 78.0–100.0)
Platelets: 401 10*3/uL — ABNORMAL HIGH (ref 150–400)
RBC: 3.39 MIL/uL — ABNORMAL LOW (ref 4.22–5.81)
RDW: 14.8 % (ref 11.5–15.5)
WBC: 8.5 10*3/uL (ref 4.0–10.5)

## 2013-12-05 NOTE — Progress Notes (Signed)
[facility]                                                                                              Progress note     Patient Demographics  James Mccormick, is a 48 y.o. male  ZOX:[id number]  WJX:[id number]  DOB - 01/13/1966  Admit date - 10/14/2013  Admitting Physician Carron CurieAli Juliannah Ohmann, MD  Outpatient Primary MD for the patient is No PCP Per Patient  LOS - 52   Abdominal abscess         Subjective:   Cristal Deerhristopher Dassow today has no new complaints  Objective:   Vital signs  Temperature 98.3 Heart rate 76 Respiratory rate 18 Blood pressure 119/78 Pulse ox 97%    Exam Awake Alert, Oriented X 3, No new F.N deficits, depressed [place].AT,PERRAL Supple Neck,No JVD, No cervical lymphadenopathy appriciated.  Symmetrical Chest wall movement, Good air movement bilaterally, CTAB IRREGULARLY IRREGULAR,No Gallops,Rubs or new Murmurs, No Parasternal Heave Degrees B.Sounds, Abd Soft, Non tender, No organomegaly appriciated, abdominal drains noted No Cyanosis, Clubbing or edema, No new Rash or bruise    I&Os  3104/1750  Data Review   CBC  Recent Labs Lab 11/29/13 0744 11/30/13 0624 12/01/13 0500 12/02/13 0705 12/05/13 0740  WBC 9.9 10.1 9.0 9.7 8.5  HGB 9.5* 9.3* 9.5* 9.8* 9.8*  HCT 28.5* 28.4* 28.8* 30.5* 30.7*  PLT 430* 423* 438* 435* 401*  MCV 87.2 87.9 89.2 90.8 90.6  MCH 29.1 28.8 29.4 29.2 28.9  MCHC 33.3 32.7 33.0 32.1 31.9  RDW 14.5 14.9 15.1 15.1 14.8  LYMPHSABS 2.6 2.7  --   --   --   MONOABS 0.9 1.1*  --   --   --   EOSABS 0.2 0.2  --   --   --   BASOSABS 0.0 0.0  --   --   --     Chemistries   Recent Labs Lab 11/29/13 0744 11/30/13 0624 12/01/13 0500 12/02/13 0705 12/05/13 0740  NA 139 140 142 140 139  K 3.2* 3.4* 3.7 3.9 4.2  CL 102 104 103 103 103  CO2 25 26 28 30 26   GLUCOSE 86 138* 130* 126* 123*  BUN 11 12 15 17 21   CREATININE 0.58 0.60 0.68 0.75  0.69  CALCIUM 8.3* 8.3* 8.3* 8.5 8.8  MG 1.8  --  2.0  --   --    Coagulation profile  Recent Labs Lab 12/04/13 0415  INR 1.10    No results found for this basename: DDIMER,  in the last 72 hours  Cardiac Enzymes No results found for this basename: CK, CKMB, TROPONINI, MYOGLOBIN,  in the last 168 hours ------------------------------------------------------------------------------------------------------------------ No components found with this basename: POCBNP,   Micro Results Recent Results (from the past 240 hour(s))  CULTURE, ROUTINE-ABSCESS     Status: None   Collection Time    12/04/13  2:16 PM      Result Value Ref Range Status   Specimen Description ABSCESS PELVIS   Final   Special Requests LEFT MID PELVIS   Final   Gram Stain  Final   Value: ABUNDANT WBC PRESENT,BOTH PMN AND MONONUCLEAR     NO SQUAMOUS EPITHELIAL CELLS SEEN     NO ORGANISMS SEEN     Performed at [facility]   Culture     Final   Value: NO GROWTH 1 DAY     Performed at [facility]   Report Status PENDING   Incomplete       Assessment & Plan   Abdominal abscesses status post perforated gastric ulcer, status post abdominal drains noted. Status post aspiration      of pelvic collection on 4/16 Abdomen wound with intra-abdominal fistula Generalized weakness continue with PT/ OT as tolerated Protein calorie malnutrition on TPN A. fib with controlled rate coronary artery disease status post NSTEMI Depression GERD History of C. difficile colitis Right IJ thrombus Acute on chronic pain syndrome Nausea Cholecystitis status percutaneous cholecystostomy tube placement by IR on4/7  Plan We'll discuss plan with surgery today   Code Status: Full  DVT Prophylaxis   Lovenox   Carron CurieAli Abeera Flannery M.D on 12/05/2013 at 2:01 PM

## 2013-12-06 NOTE — Progress Notes (Signed)
[facility]                                                                                              Progress note     Patient Demographics  James Mccormick, is a 48 y.o. male  WUJ:[id number]  NFA:[id number]  DOB - 04/19/1966  Admit date - 10/14/2013  Admitting Physician Carron CurieAli Abdulahad Mederos, MD  Outpatient Primary MD for the patient is No PCP Per Patient  LOS - 53   Abdominal abscess         Subjective:   Cristal Deerhristopher Bates today has no new complaints. Tolerating by mouth clear liquids NG tube is clamped. His IPG P. drain removed by accident during bathing. Will monitor. 98.3  Objective:   Vital signs  Temperature 98.3 Heart rate 85 Respiratory rate 18 Blood pressure 102/60 Pulse ox 97%    Exam Awake Alert, Oriented X 3, No new F.N deficits, depressed [place].AT,PERRAL Supple Neck,No JVD, No cervical lymphadenopathy appriciated.  Symmetrical Chest wall movement, Good air movement bilaterally, CTAB IRREGULARLY IRREGULAR,No Gallops,Rubs or new Murmurs, No Parasternal Heave Degrees B.Sounds, Abd Soft, Non tender, No organomegaly appriciated, abdominal drains noted. Right JP drain removed No Cyanosis, Clubbing or edema, No new Rash or bruise    I&Os  4030/3842  Data Review   CBC  Recent Labs Lab 11/30/13 0624 12/01/13 0500 12/02/13 0705 12/05/13 0740  WBC 10.1 9.0 9.7 8.5  HGB 9.3* 9.5* 9.8* 9.8*  HCT 28.4* 28.8* 30.5* 30.7*  PLT 423* 438* 435* 401*  MCV 87.9 89.2 90.8 90.6  MCH 28.8 29.4 29.2 28.9  MCHC 32.7 33.0 32.1 31.9  RDW 14.9 15.1 15.1 14.8  LYMPHSABS 2.7  --   --   --   MONOABS 1.1*  --   --   --   EOSABS 0.2  --   --   --   BASOSABS 0.0  --   --   --     Chemistries   Recent Labs Lab 11/30/13 0624 12/01/13 0500 12/02/13 0705 12/05/13 0740  NA 140 142 140 139  K 3.4* 3.7 3.9 4.2  CL 104 103 103 103  CO2 26 28 30 26   GLUCOSE 138* 130* 126* 123*  BUN 12  15 17 21   CREATININE 0.60 0.68 0.75 0.69  CALCIUM 8.3* 8.3* 8.5 8.8  MG  --  2.0  --   --    Coagulation profile  Recent Labs Lab 12/04/13 0415  INR 1.10    No results found for this basename: DDIMER,  in the last 72 hours  Cardiac Enzymes No results found for this basename: CK, CKMB, TROPONINI, MYOGLOBIN,  in the last 168 hours ------------------------------------------------------------------------------------------------------------------ No components found with this basename: POCBNP,   Micro Results Recent Results (from the past 240 hour(s))  CULTURE, ROUTINE-ABSCESS     Status: None   Collection Time    12/04/13  2:16 PM      Result Value Ref Range Status   Specimen Description ABSCESS PELVIS   Final   Special Requests LEFT MID PELVIS   Final   Gram Stain  Final   Value: ABUNDANT WBC PRESENT,BOTH PMN AND MONONUCLEAR     NO SQUAMOUS EPITHELIAL CELLS SEEN     NO ORGANISMS SEEN     Performed at [facility]   Culture     Final   Value: NO GROWTH 2 DAYS     Performed at [facility]   Report Status PENDING   Incomplete       Assessment & Plan   Abdominal abscesses status post perforated gastric ulcer, status post abdominal drains noted. Status post aspiration      of pelvic collection on 4/16 Abdomen wound with intra-abdominal fistula Generalized weakness continue with PT/ OT as tolerated Protein calorie malnutrition on TPN, started on clear liquid diet A. fib with controlled rate coronary artery disease status post NSTEMI Depression GERD History of C. difficile colitis Right IJ thrombus Acute on chronic pain syndrome Nausea resolved Cholecystitis status percutaneous cholecystostomy tube placement by IR on4/7  Plan Check CBC tomorrow Removed NG tube if CBC is normal in a.m. CT of the abdomen on Monday Discussed with surgery yesterday   Code Status: Full  DVT Prophylaxis   Lovenox   Carron CurieAli Mico Spark M.D on 12/06/2013 at 1:01 PM

## 2013-12-07 LAB — CBC WITH DIFFERENTIAL/PLATELET
BASOS PCT: 0 % (ref 0–1)
Basophils Absolute: 0 10*3/uL (ref 0.0–0.1)
EOS ABS: 0.2 10*3/uL (ref 0.0–0.7)
Eosinophils Relative: 2 % (ref 0–5)
HCT: 33.1 % — ABNORMAL LOW (ref 39.0–52.0)
HEMOGLOBIN: 10.4 g/dL — AB (ref 13.0–17.0)
Lymphocytes Relative: 26 % (ref 12–46)
Lymphs Abs: 2 10*3/uL (ref 0.7–4.0)
MCH: 28.3 pg (ref 26.0–34.0)
MCHC: 31.4 g/dL (ref 30.0–36.0)
MCV: 90.2 fL (ref 78.0–100.0)
MONOS PCT: 10 % (ref 3–12)
Monocytes Absolute: 0.8 10*3/uL (ref 0.1–1.0)
NEUTROS PCT: 62 % (ref 43–77)
Neutro Abs: 4.8 10*3/uL (ref 1.7–7.7)
PLATELETS: 344 10*3/uL (ref 150–400)
RBC: 3.67 MIL/uL — ABNORMAL LOW (ref 4.22–5.81)
RDW: 14.7 % (ref 11.5–15.5)
WBC: 7.7 10*3/uL (ref 4.0–10.5)

## 2013-12-07 NOTE — Progress Notes (Signed)
[facility]                                                                                              Progress note     Patient Demographics  James Mccormick, is a 48 y.o. male  WUJ:[id number]:[id number]  NFA:[id number]  DOB - 11/06/1965  Admit date - 10/14/2013  Admitting Physician Carron CurieAli Jarmar Rousseau, MD  Outpatient Primary MD for the patient is No PCP Per Patient  LOS - 54   Abdominal abscess         Subjective:   James Mccormick today has no new complaints. Tolerating by mouth clear liquids NG tube is clamped.   Objective:   Vital signs  Temperature 97.4 Heart rate 72 Respiratory rate 18 Blood pressure 102/65 Pulse ox 97%    Exam Awake Alert, Oriented X 3, No new F.N deficits, depressed [place].AT,PERRAL Supple Neck,No JVD, No cervical lymphadenopathy appriciated.  Symmetrical Chest wall movement, Good air movement bilaterally, CTAB IRREGULARLY IRREGULAR,No Gallops,Rubs or new Murmurs, No Parasternal Heave Degrees B.Sounds, Abd Soft, Non tender, No organomegaly appriciated, abdominal drains noted. Right JP drain removed No Cyanosis, Clubbing or edema, No new Rash or bruise    I&Os  2120/2450  Data Review   CBC  Recent Labs Lab 12/01/13 0500 12/02/13 0705 12/05/13 0740 12/07/13 0925  WBC 9.0 9.7 8.5 7.7  HGB 9.5* 9.8* 9.8* 10.4*  HCT 28.8* 30.5* 30.7* 33.1*  PLT 438* 435* 401* 344  MCV 89.2 90.8 90.6 90.2  MCH 29.4 29.2 28.9 28.3  MCHC 33.0 32.1 31.9 31.4  RDW 15.1 15.1 14.8 14.7  LYMPHSABS  --   --   --  2.0  MONOABS  --   --   --  0.8  EOSABS  --   --   --  0.2  BASOSABS  --   --   --  0.0    Chemistries   Recent Labs Lab 12/01/13 0500 12/02/13 0705 12/05/13 0740  NA 142 140 139  K 3.7 3.9 4.2  CL 103 103 103  CO2 28 30 26   GLUCOSE 130* 126* 123*  BUN 15 17 21   CREATININE 0.68 0.75 0.69  CALCIUM 8.3* 8.5 8.8  MG 2.0  --   --    Coagulation profile  Recent  Labs Lab 12/04/13 0415  INR 1.10    No results found for this basename: DDIMER,  in the last 72 hours  Cardiac Enzymes No results found for this basename: CK, CKMB, TROPONINI, MYOGLOBIN,  in the last 168 hours ------------------------------------------------------------------------------------------------------------------ No components found with this basename: POCBNP,   Micro Results Recent Results (from the past 240 hour(s))  CULTURE, ROUTINE-ABSCESS     Status: None   Collection Time    12/04/13  2:16 PM      Result Value Ref Range Status   Specimen Description ABSCESS PELVIS   Final   Special Requests LEFT MID PELVIS   Final   Gram Stain     Final   Value: ABUNDANT WBC PRESENT,BOTH PMN AND MONONUCLEAR     NO SQUAMOUS EPITHELIAL CELLS SEEN     NO  ORGANISMS SEEN     Performed at [facility]   Culture     Final   Value: NO GROWTH 3 DAYS     Performed at [facility]   Report Status PENDING   Incomplete       Assessment & Plan   Abdominal abscesses status post perforated gastric ulcer, status post abdominal drains noted. Status post aspiration      of pelvic collection on 4/16 Abdomen wound with intra-abdominal fistula Generalized weakness continue with PT/ OT as tolerated Protein calorie malnutrition on TPN, started on clear liquid diet A. fib with controlled rate coronary artery disease status post NSTEMI Depression GERD History of C. difficile colitis Right IJ thrombus Acute on chronic pain syndrome Nausea resolved Cholecystitis status percutaneous cholecystostomy tube placement by IR on4/7  Plan DC NG tube CT of the abdomen on Monday    Code Status: Full  DVT Prophylaxis   Lovenox   Carron CurieAli Meria Crilly M.D on 12/07/2013 at 12:55 PM

## 2013-12-08 ENCOUNTER — Other Ambulatory Visit (HOSPITAL_COMMUNITY): Payer: Medicaid Other

## 2013-12-08 ENCOUNTER — Encounter: Payer: Self-pay | Admitting: *Deleted

## 2013-12-08 LAB — CBC
HCT: 33 % — ABNORMAL LOW (ref 39.0–52.0)
Hemoglobin: 10.5 g/dL — ABNORMAL LOW (ref 13.0–17.0)
MCH: 28.9 pg (ref 26.0–34.0)
MCHC: 31.8 g/dL (ref 30.0–36.0)
MCV: 90.9 fL (ref 78.0–100.0)
Platelets: 310 10*3/uL (ref 150–400)
RBC: 3.63 MIL/uL — AB (ref 4.22–5.81)
RDW: 14.8 % (ref 11.5–15.5)
WBC: 7.1 10*3/uL (ref 4.0–10.5)

## 2013-12-08 LAB — BASIC METABOLIC PANEL
BUN: 20 mg/dL (ref 6–23)
CO2: 27 meq/L (ref 19–32)
CREATININE: 0.73 mg/dL (ref 0.50–1.35)
Calcium: 9.1 mg/dL (ref 8.4–10.5)
Chloride: 103 mEq/L (ref 96–112)
GFR calc Af Amer: >90 mL/min (ref >90)
GFR calc non Af Amer: >90 mL/min (ref >90)
GLUCOSE: 108 mg/dL — AB (ref 70–99)
Potassium: 4.4 mEq/L (ref 3.7–5.3)
Sodium: 139 mEq/L (ref 137–147)

## 2013-12-08 LAB — CULTURE, ROUTINE-ABSCESS: Culture: NO GROWTH

## 2013-12-08 MED ORDER — IOHEXOL 300 MG/ML  SOLN: 25.0000 mL | INTRAMUSCULAR | Status: AC

## 2013-12-08 MED ORDER — IOHEXOL 300 MG/ML  SOLN
100.0000 mL | Freq: Once | INTRAMUSCULAR | Status: AC | PRN
  Administered 2013-12-08: 100 mL via INTRAVENOUS

## 2013-12-08 NOTE — Progress Notes (Signed)
[facility]                                                                                              Progress note     Patient Demographics  James Mccormick, is a 48 y.o. male  WUJ:[id number]:[id number]  NFA:[id number]  DOB - 12/23/1965  Admit date - 10/14/2013  Admitting Physician Carron CurieAli Linlee Cromie, MD  Outpatient Primary MD for the patient is No PCP Per Patient  LOS - 55   Abdominal abscess         Subjective:   James Mccormick today has no new complaints. NG tube is off. Patient tolerating by mouth clear liquid diet without any problems  Objective:   Vital signs  Temperature 97.9 Heart rate 76 Respiratory rate 18 Blood pressure 104/71 Pulse ox 97%    Exam Awake Alert, Oriented X 3, No new F.N deficits, depressed [place].AT,PERRAL Supple Neck,No JVD, No cervical lymphadenopathy appriciated.  Symmetrical Chest wall movement, Good air movement bilaterally, CTAB IRREGULARLY IRREGULAR,No Gallops,Rubs or new Murmurs, No Parasternal Heave Degrees B.Sounds, Abd Soft, Non tender, No organomegaly appriciated, abdominal drains noted. Right JP drain removed No Cyanosis, Clubbing or edema, No new Rash or bruise    I&Os  3480/3025  Data Review   CBC  Recent Labs Lab 12/02/13 0705 12/05/13 0740 12/07/13 0925 12/08/13 0403  WBC 9.7 8.5 7.7 7.1  HGB 9.8* 9.8* 10.4* 10.5*  HCT 30.5* 30.7* 33.1* 33.0*  PLT 435* 401* 344 310  MCV 90.8 90.6 90.2 90.9  MCH 29.2 28.9 28.3 28.9  MCHC 32.1 31.9 31.4 31.8  RDW 15.1 14.8 14.7 14.8  LYMPHSABS  --   --  2.0  --   MONOABS  --   --  0.8  --   EOSABS  --   --  0.2  --   BASOSABS  --   --  0.0  --     Chemistries   Recent Labs Lab 12/02/13 0705 12/05/13 0740 12/08/13 0403  NA 140 139 139  K 3.9 4.2 4.4  CL 103 103 103  CO2 30 26 27   GLUCOSE 126* 123* 108*  BUN 17 21 20   CREATININE 0.75 0.69 0.73  CALCIUM 8.5 8.8 9.1   Coagulation  profile  Recent Labs Lab 12/04/13 0415  INR 1.10    No results found for this basename: DDIMER,  in the last 72 hours  Cardiac Enzymes No results found for this basename: CK, CKMB, TROPONINI, MYOGLOBIN,  in the last 168 hours ------------------------------------------------------------------------------------------------------------------ No components found with this basename: POCBNP,   Micro Results Recent Results (from the past 240 hour(s))  CULTURE, ROUTINE-ABSCESS     Status: None   Collection Time    12/04/13  2:16 PM      Result Value Ref Range Status   Specimen Description ABSCESS PELVIS   Final   Special Requests LEFT MID PELVIS   Final   Gram Stain     Final   Value: ABUNDANT WBC PRESENT,BOTH PMN AND MONONUCLEAR     NO SQUAMOUS EPITHELIAL CELLS SEEN     NO ORGANISMS SEEN  Performed at [facility]   Culture     Final   Value: NO GROWTH 3 DAYS     Performed at [facility]   Report Status 12/08/2013 FINAL   Final       Assessment & Plan   Abdominal abscesses status post perforated gastric ulcer, status post abdominal drains noted. Status post aspiration      of pelvic collection on 4/16 Abdomen wound with intra-abdominal fistula Generalized weakness continue with PT/ OT as tolerated Protein calorie malnutrition on TPN, started on clear liquid diet A. fib with controlled rate coronary artery disease status post NSTEMI Depression GERD History of C. difficile colitis Right IJ thrombus Acute on chronic pain syndrome Nausea resolved Cholecystitis status percutaneous cholecystostomy tube placement by IR on4/7  Plan Continue same management Will discuss with surgery in a.m. Advance diet in a.m. to full liquid Paper TPN over one week    Code Status: Full  DVT Prophylaxis   Lovenox   Carron CurieAli Allyssia Skluzacek M.D on 12/08/2013 at 6:24 PM

## 2013-12-09 NOTE — Progress Notes (Signed)
[facility]                                                                                              Progress note     Patient Demographics  James Mccormick, is a 48 y.o. male  NWG:[id number]:[id number]  [id number]  DOB - 08/01/1966  Admit date - 10/14/2013  Admitting Physician Carron CurieAli Norell Brisbin, MD  Outpatient Primary MD for the patient is No PCP Per Patient  LOS - 56   Abdominal abscess         Subjective:   [name] today has no new complaints. NG tube is off. Patient tolerating by mouth clear liquid diet without any problems  Objective:   Vital signs  Temperature 98.1 Heart rate 82 Respiratory rate 18 Blood pressure 118/58 Pulse ox 94%    Exam Awake Alert, Oriented X 3, No new F.N deficits, depressed [place].AT,PERRAL Supple Neck,No JVD, No cervical lymphadenopathy appriciated.  Symmetrical Chest wall movement, Good air movement bilaterally, CTAB IRREGULARLY IRREGULAR,No Gallops,Rubs or new Murmurs, No Parasternal Heave Degrees B.Sounds, Abd Soft, Non tender, No organomegaly appriciated, .  JP drains removed No Cyanosis, Clubbing or edema, No new Rash or bruise    I&Os  1180/850  Data Review   CBC  Recent Labs Lab 12/05/13 0740 12/07/13 0925 12/08/13 0403  WBC 8.5 7.7 7.1  HGB 9.8* 10.4* 10.5*  HCT 30.7* 33.1* 33.0*  PLT 401* 344 310  MCV 90.6 90.2 90.9  MCH 28.9 28.3 28.9  MCHC 31.9 31.4 31.8  RDW 14.8 14.7 14.8  LYMPHSABS  --  2.0  --   MONOABS  --  0.8  --   EOSABS  --  0.2  --   BASOSABS  --  0.0  --     Chemistries   Recent Labs Lab 12/05/13 0740 12/08/13 0403  NA 139 139  K 4.2 4.4  CL 103 103  CO2 26 27  GLUCOSE 123* 108*  BUN 21 20  CREATININE 0.69 0.73  CALCIUM 8.8 9.1   Coagulation profile  Recent Labs Lab 12/04/13 0415  INR 1.10    No results found for this basename: DDIMER,  in the last 72 hours  Cardiac Enzymes No results  found for this basename: CK, CKMB, TROPONINI, MYOGLOBIN,  in the last 168 hours ------------------------------------------------------------------------------------------------------------------ No components found with this basename: POCBNP,   Micro Results Recent Results (from the past 240 hour(s))  CULTURE, ROUTINE-ABSCESS     Status: None   Collection Time    12/04/13  2:16 PM      Result Value Ref Range Status   Specimen Description ABSCESS PELVIS   Final   Special Requests LEFT MID PELVIS   Final   Gram Stain     Final   Value: ABUNDANT WBC PRESENT,BOTH PMN AND MONONUCLEAR     NO SQUAMOUS EPITHELIAL CELLS SEEN     NO ORGANISMS SEEN     Performed at [facility]   Culture     Final   Value: NO GROWTH 3 DAYS     Performed at [facility]   Report Status  12/08/2013 FINAL   Final       Assessment & Plan   Abdominal abscesses status post perforated gastric ulcer, status post abdominal drains removed. Status post aspiration      of pelvic collection on 4/16 Abdomen wound with intra-abdominal fistula Generalized weakness continue with PT/ OT as tolerated Protein calorie malnutrition on TPN, started on clear liquid diet A. fib with controlled rate coronary artery disease status post NSTEMI Depression GERD History of C. difficile colitis Right IJ thrombus Acute on chronic pain syndrome Nausea resolved Cholecystitis status percutaneous cholecystostomy tube placement by IR on4/7  Plan Continue same management We'll probably advance diet again on Thursday Add OxyIR  Discussed with surgery, Dr. Andrey CampanileWilson today with the drain being removed and the CT scan results. Paper TPN over one week    Code Status: Full  DVT Prophylaxis   Lovenox   Carron CurieAli Euell Schiff M.D on 12/09/2013 at 2:12 PM

## 2013-12-10 NOTE — Progress Notes (Signed)
[facility]                                                                                              Progress note     Patient Demographics  James Mccormick, is a 48 y.o. male  ZOX:[id number]:[id number]  WJX:[id number]  DOB - 09/15/1965  Admit date - 10/14/2013  Admitting Physician Carron CurieAli Dmarion Perfect, MD  Outpatient Primary MD for the patient is No PCP Per Patient  LOS - 57   Abdominal abscess         Subjective:   James Mccormick today has no new complaints. NG tube is off. Patient tolerating by mouth clear liquid diet without any problems  Objective:   Vital signs  Temperature 98.2 Heart rate 77 Respiratory rate 18 Blood pressure 107/60 Pulse ox 93%    Exam Awake Alert, Oriented X 3, No new F.N deficits, depressed [place].AT,PERRAL Supple Neck,No JVD, No cervical lymphadenopathy appriciated.  Symmetrical Chest wall movement, Good air movement bilaterally, CTAB IRREGULARLY IRREGULAR,No Gallops,Rubs or new Murmurs, No Parasternal Heave Degrees B.Sounds, Abd Soft, Non tender, No organomegaly appriciated, .  JP drains removed No Cyanosis, Clubbing or edema, No new Rash or bruise    I&Os  3700/3475  Data Review   CBC  Recent Labs Lab 12/05/13 0740 12/07/13 0925 12/08/13 0403  WBC 8.5 7.7 7.1  HGB 9.8* 10.4* 10.5*  HCT 30.7* 33.1* 33.0*  PLT 401* 344 310  MCV 90.6 90.2 90.9  MCH 28.9 28.3 28.9  MCHC 31.9 31.4 31.8  RDW 14.8 14.7 14.8  LYMPHSABS  --  2.0  --   MONOABS  --  0.8  --   EOSABS  --  0.2  --   BASOSABS  --  0.0  --     Chemistries   Recent Labs Lab 12/05/13 0740 12/08/13 0403  NA 139 139  K 4.2 4.4  CL 103 103  CO2 26 27  GLUCOSE 123* 108*  BUN 21 20  CREATININE 0.69 0.73  CALCIUM 8.8 9.1   Coagulation profile  Recent Labs Lab 12/04/13 0415  INR 1.10    No results found for this basename: DDIMER,  in the last 72 hours  Cardiac Enzymes No results  found for this basename: CK, CKMB, TROPONINI, MYOGLOBIN,  in the last 168 hours ------------------------------------------------------------------------------------------------------------------ No components found with this basename: POCBNP,   Micro Results Recent Results (from the past 240 hour(s))  CULTURE, ROUTINE-ABSCESS     Status: None   Collection Time    12/04/13  2:16 PM      Result Value Ref Range Status   Specimen Description ABSCESS PELVIS   Final   Special Requests LEFT MID PELVIS   Final   Gram Stain     Final   Value: ABUNDANT WBC PRESENT,BOTH PMN AND MONONUCLEAR     NO SQUAMOUS EPITHELIAL CELLS SEEN     NO ORGANISMS SEEN     Performed at [facility]   Culture     Final   Value: NO GROWTH 3 DAYS     Performed at [facility]   Report Status  12/08/2013 FINAL   Final       Assessment & Plan   Abdominal abscesses status post perforated gastric ulcer, status post abdominal drains removed. Status post aspiration      of pelvic collection on 4/16 Abdomen wound with intra-abdominal fistula Generalized weakness continue with PT/ OT as tolerated Protein calorie malnutrition on TPN, started on clear liquid diet A. fib with controlled rate coronary artery disease status post NSTEMI Depression GERD History of C. difficile colitis Right IJ thrombus Acute on chronic pain syndrome Nausea resolved Cholecystitis status percutaneous cholecystostomy tube placement by IR on4/7  Plan Continue same management Advance diet in a.m. to dysphagia 2 with thin liquids Taper TPN over this week  Code Status: Full  DVT Prophylaxis   Lovenox   Carron CurieAli Searcy Miyoshi M.D on 12/10/2013 at 5:14 PM

## 2013-12-15 LAB — CBC
HCT: 36.7 % — ABNORMAL LOW (ref 39.0–52.0)
Hemoglobin: 12.5 g/dL — ABNORMAL LOW (ref 13.0–17.0)
MCH: 29.4 pg (ref 26.0–34.0)
MCHC: 34.1 g/dL (ref 30.0–36.0)
MCV: 86.4 fL (ref 78.0–100.0)
PLATELETS: 184 10*3/uL (ref 150–400)
RBC: 4.25 MIL/uL (ref 4.22–5.81)
RDW: 14.6 % (ref 11.5–15.5)
WBC: 6.7 10*3/uL (ref 4.0–10.5)

## 2013-12-15 LAB — BASIC METABOLIC PANEL
BUN: 16 mg/dL (ref 6–23)
CALCIUM: 10.1 mg/dL (ref 8.4–10.5)
CO2: 27 mEq/L (ref 19–32)
CREATININE: 1.12 mg/dL (ref 0.50–1.35)
Chloride: 97 mEq/L (ref 96–112)
GFR calc Af Amer: 87 mL/min — ABNORMAL LOW (ref >90)
GFR, EST NON AFRICAN AMERICAN: 75 mL/min — AB (ref >90)
GLUCOSE: 106 mg/dL — AB (ref 70–99)
Potassium: 4.5 mEq/L (ref 3.7–5.3)
Sodium: 136 mEq/L — ABNORMAL LOW (ref 137–147)

## 2013-12-15 NOTE — Progress Notes (Signed)
[facility]                                                                                              Progress note     Patient Demographics  James HazelChristopher Mccormick, is a 48 y.o. male  ZOX:[id number]:[id number]  WJX:[id number]  DOB - 03/26/1966  Admit date - 10/14/2013  Admitting Physician Carron CurieAli Ethelwyn Gilbertson, MD  Outpatient Primary MD for the patient is No PCP Per Patient  LOS - 62   Abdominal abscess         Subjective:   James Mccormick today has no new complaints. NG tube is off. Patient tolerating by mouth clear liquid diet without any problems  Objective:   Vital signs  Temperature 97.1 Heart rate 82 Respiratory rate 18 Blood pressure 108/80 Pulse ox 96%    Exam Awake Alert, Oriented X 3, No new F.N deficits, depressed [place].AT,PERRAL Supple Neck,No JVD, No cervical lymphadenopathy appriciated.  Symmetrical Chest wall movement, Good air movement bilaterally, CTAB IRREGULARLY IRREGULAR,No Gallops,Rubs or new Murmurs, No Parasternal Heave Degrees B.Sounds, Abd Soft, Non tender, No organomegaly appriciated, .  JP drains removed No Cyanosis, Clubbing or edema, No new Rash or bruise    I&Os 1240/1180  Data Review   CBC  Recent Labs Lab 12/15/13 0803  WBC 6.7  HGB 12.5*  HCT 36.7*  PLT 184  MCV 86.4  MCH 29.4  MCHC 34.1  RDW 14.6    Chemistries   Recent Labs Lab 12/15/13 0803  NA 136*  K 4.5  CL 97  CO2 27  GLUCOSE 106*  BUN 16  CREATININE 1.12  CALCIUM 10.1   Coagulation profile No results found for this basename: INR, PROTIME,  in the last 168 hours  No results found for this basename: DDIMER,  in the last 72 hours  Cardiac Enzymes No results found for this basename: CK, CKMB, TROPONINI, MYOGLOBIN,  in the last 168 hours ------------------------------------------------------------------------------------------------------------------ No components found with this  basename: POCBNP,   Micro Results No results found for this or any previous visit (from the past 240 hour(s)).     Assessment & Plan   Abdominal abscesses status post perforated gastric ulcer, status post abdominal drains removed. Status post aspiration      of pelvic collection on 4/16 Abdomen wound with intra-abdominal fistula on by mouth antibiotics Generalized weakness continue with PT/ OT as tolerated Protein calorie malnutrition tolerating dysphagia 2 diet with thin liquids without any problems A. fib with controlled rate coronary artery disease status post NSTEMI Depression GERD History of C. difficile colitis on by mouth Flagyl  Right IJ thrombus on Lovenox Acute on chronic pain syndrome Nausea recurrent Cholecystitis status percutaneous cholecystostomy tube placement by IR on4/7 to continue for 6 weeks total  Plan Continue same management   Code Status: Full  DVT Prophylaxis   Lovenox   Carron CurieAli Suzanna Zahn M.D on 12/15/2013 at 2:14 PM

## 2013-12-16 NOTE — Progress Notes (Signed)
[facility]                                                                                              Progress note     Patient Demographics  James Mccormick, is a 48 y.o. male  OZH:[id number]:[id number]  GEX:[id number]  DOB - 09/07/1965  Admit date - 10/14/2013  Admitting Physician Carron CurieAli Seri Kimmer, MD  Outpatient Primary MD for the patient is No PCP Per Patient  LOS - 63   Abdominal abscess         Subjective:   James Mccormick today has no new complaints. NG tube is off. Patient tolerating by mouth clear liquid diet without any problems  Objective:   Vital signs  Temperature 97.3 Heart rate 72 Respiratory rate 18 Blood pressure 108/78 Pulse ox 96%    Exam Awake Alert, Oriented X 3, No new F.N deficits, depressed [place].AT,PERRAL Supple Neck,No JVD, No cervical lymphadenopathy appriciated.  Symmetrical Chest wall movement, Good air movement bilaterally, CTAB IRREGULARLY IRREGULAR,No Gallops,Rubs or new Murmurs, No Parasternal Heave Degrees B.Sounds, Abd Soft, Non tender, No organomegaly appriciated, .  JP drains removed No Cyanosis, Clubbing or edema, No new Rash or bruise    I&Os 1545/1600  Data Review   CBC  Recent Labs Lab 12/15/13 0803  WBC 6.7  HGB 12.5*  HCT 36.7*  PLT 184  MCV 86.4  MCH 29.4  MCHC 34.1  RDW 14.6    Chemistries   Recent Labs Lab 12/15/13 0803  NA 136*  K 4.5  CL 97  CO2 27  GLUCOSE 106*  BUN 16  CREATININE 1.12  CALCIUM 10.1   Coagulation profile No results found for this basename: INR, PROTIME,  in the last 168 hours  No results found for this basename: DDIMER,  in the last 72 hours  Cardiac Enzymes No results found for this basename: CK, CKMB, TROPONINI, MYOGLOBIN,  in the last 168 hours ------------------------------------------------------------------------------------------------------------------ No components found with this  basename: POCBNP,   Micro Results No results found for this or any previous visit (from the past 240 hour(s)).     Assessment & Plan   Abdominal abscesses status post perforated gastric ulcer, status post abdominal drains removed. Status post aspiration      of pelvic collection on 4/16 Abdomen wound with intra-abdominal fistula on by mouth antibiotics Generalized weakness continue with PT/ OT as tolerated Protein calorie malnutrition tolerating dysphagia 2 diet with thin liquids without any problems A. fib with controlled rate coronary artery disease status post NSTEMI Depression GERD History of C. difficile colitis on by mouth Flagyl  Right IJ thrombus on Lovenox Acute on chronic pain syndrome Nausea recurrent Cholecystitis status percutaneous cholecystostomy tube placement by IR on4/7 to continue for 6 weeks total  Plan Continue same management   Code Status: Full  DVT Prophylaxis   Lovenox   Carron CurieAli Lambert Jeanty M.D on 12/16/2013 at 2:21 PM

## 2013-12-17 NOTE — Progress Notes (Signed)
[facility]                                                                                              Progress note     Patient Demographics  James Mccormick, is a 48 y.o. male  ZOX:[id number]:[id number]  WJX:[id number]  DOB - 01/22/1966  Admit date - 10/14/2013  Admitting Physician Carron CurieAli Sherrelle Prochazka, MD  Outpatient Primary MD for the patient is No PCP Per Patient  LOS - 64   Abdominal abscess         Subjective:   James Mccormick today has no new complaints. NG tube is off. Patient tolerating by mouth clear liquid diet without any problems  Objective:   Vital signs  Temperature 97.3 Heart rate 74 Respiratory rate 18 Blood pressure 108/60 Pulse ox 96%    Exam Awake Alert, Oriented X 3, No new F.N deficits, depressed [place].AT,PERRAL Supple Neck,No JVD, No cervical lymphadenopathy appriciated.  Symmetrical Chest wall movement, Good air movement bilaterally, CTAB IRREGULARLY IRREGULAR,No Gallops,Rubs or new Murmurs, No Parasternal Heave Degrees B.Sounds, Abd Soft, Non tender, No organomegaly appriciated, .  JP drains removed No Cyanosis, Clubbing or edema, No new Rash or bruise    I&Os 1490/1475  Data Review   CBC  Recent Labs Lab 12/15/13 0803  WBC 6.7  HGB 12.5*  HCT 36.7*  PLT 184  MCV 86.4  MCH 29.4  MCHC 34.1  RDW 14.6    Chemistries   Recent Labs Lab 12/15/13 0803  NA 136*  K 4.5  CL 97  CO2 27  GLUCOSE 106*  BUN 16  CREATININE 1.12  CALCIUM 10.1   Coagulation profile No results found for this basename: INR, PROTIME,  in the last 168 hours  No results found for this basename: DDIMER,  in the last 72 hours  Cardiac Enzymes No results found for this basename: CK, CKMB, TROPONINI, MYOGLOBIN,  in the last 168 hours ------------------------------------------------------------------------------------------------------------------ No components found with this  basename: POCBNP,   Micro Results No results found for this or any previous visit (from the past 240 hour(s)).     Assessment & Plan   Abdominal abscesses status post perforated gastric ulcer, status post abdominal drains removed. Status post          aspiration of pelvic collection on 4/16 Abdomen wound with intra-abdominal fistula on by mouth antibiotics Generalized weakness continue with PT/ OT as tolerated Protein calorie malnutrition tolerating dysphagia 2 diet with thin liquids without any problems A. fib with controlled rate coronary artery disease status post NSTEMI Depression GERD History of C. difficile colitis on by mouth Flagyl  Right IJ thrombus on Lovenox Acute on chronic pain syndrome Nausea recurrent Cholecystitis status percutaneous cholecystostomy tube placement by IR on4/7 to continue for 6 weeks total  Plan Check labs in a.m.   Code Status: Full  DVT Prophylaxis   Lovenox   Carron CurieAli Patrisia Faeth M.D on 12/17/2013 at 1:57 PM

## 2013-12-18 LAB — CBC
HCT: 40.4 % (ref 39.0–52.0)
Hemoglobin: 13.5 g/dL (ref 13.0–17.0)
MCH: 28.9 pg (ref 26.0–34.0)
MCHC: 33.4 g/dL (ref 30.0–36.0)
MCV: 86.5 fL (ref 78.0–100.0)
PLATELETS: 212 10*3/uL (ref 150–400)
RBC: 4.67 MIL/uL (ref 4.22–5.81)
RDW: 14.8 % (ref 11.5–15.5)
WBC: 6.8 10*3/uL (ref 4.0–10.5)

## 2013-12-18 LAB — BASIC METABOLIC PANEL
BUN: 17 mg/dL (ref 6–23)
CHLORIDE: 95 meq/L — AB (ref 96–112)
CO2: 27 mEq/L (ref 19–32)
CREATININE: 1.27 mg/dL (ref 0.50–1.35)
Calcium: 10.1 mg/dL (ref 8.4–10.5)
GFR, EST AFRICAN AMERICAN: 75 mL/min — AB (ref >90)
GFR, EST NON AFRICAN AMERICAN: 65 mL/min — AB (ref >90)
Glucose, Bld: 131 mg/dL — ABNORMAL HIGH (ref 70–99)
Potassium: 4.5 mEq/L (ref 3.7–5.3)
Sodium: 135 mEq/L — ABNORMAL LOW (ref 137–147)

## 2013-12-18 NOTE — Progress Notes (Signed)
[facility]                                                                                              Progress note     Patient Demographics  James Mccormick Cuny, is a 48 y.o. male  ZOX:[id number]:[id number]  WJX:[id number]  DOB - 06/02/1966  Admit date - 10/14/2013  Admitting Physician Carron CurieAli Jalaysia Lobb, MD  Outpatient Primary MD for the patient is No PCP Per Patient  LOS - 65   Abdominal abscess         Subjective:   Cristal Deerhristopher Escalante today has no new complaints. NG tube is off. Patient tolerating by mouth clear liquid diet without any problems  Objective:   Vital signs  Temperature 98.2 Heart rate 71 Respiratory rate 20 Blood pressure 106/74 Pulse ox 96%    Exam Awake Alert, Oriented X 3, No new F.N deficits, depressed [place].AT,PERRAL Supple Neck,No JVD, No cervical lymphadenopathy appriciated.  Symmetrical Chest wall movement, Good air movement bilaterally, CTAB IRREGULARLY IRREGULAR,No Gallops,Rubs or new Murmurs, No Parasternal Heave Degrees B.Sounds, Abd Soft, Non tender, No organomegaly appriciated, .  JP drains removed No Cyanosis, Clubbing or edema, No new Rash or bruise    I&Os 1420/1790  Data Review   CBC  Recent Labs Lab 12/15/13 0803 12/18/13 0846  WBC 6.7 6.8  HGB 12.5* 13.5  HCT 36.7* 40.4  PLT 184 212  MCV 86.4 86.5  MCH 29.4 28.9  MCHC 34.1 33.4  RDW 14.6 14.8    Chemistries   Recent Labs Lab 12/15/13 0803 12/18/13 0846  NA 136* 135*  K 4.5 4.5  CL 97 95*  CO2 27 27  GLUCOSE 106* 131*  BUN 16 17  CREATININE 1.12 1.27  CALCIUM 10.1 10.1   Coagulation profile No results found for this basename: INR, PROTIME,  in the last 168 hours  No results found for this basename: DDIMER,  in the last 72 hours  Cardiac Enzymes No results found for this basename: CK, CKMB, TROPONINI, MYOGLOBIN,  in the last 168  hours ------------------------------------------------------------------------------------------------------------------ No components found with this basename: POCBNP,   Micro Results No results found for this or any previous visit (from the past 240 hour(s)).     Assessment & Plan   Abdominal abscesses status post perforated gastric ulcer, status post abdominal drains removed. Status post          aspiration of pelvic collection on 4/16 Abdomen wound with intra-abdominal fistula on by mouth antibiotics Generalized weakness continue with PT/ OT as tolerated Protein calorie malnutrition tolerating dysphagia 2 diet with thin liquids without any problems A. fib with controlled rate coronary artery disease status post NSTEMI Depression GERD History of C. difficile colitis on by mouth Flagyl  Right IJ thrombus on Lovenox Acute on chronic pain syndrome Nausea recurrent Cholecystitis status percutaneous cholecystostomy tube placement by IR on4/7 to continue for 6 weeks total  Plan Continue same treatment   Code Status: Full  DVT Prophylaxis   Lovenox   Carron CurieAli Darlinda Bellows M.D on 12/18/2013 at 3:06 PM

## 2013-12-19 NOTE — Progress Notes (Signed)
[facility]                                                                                              Progress note     Patient Demographics  James Mccormick, is a 48 y.o. male  ZOX:[id number]:[id number]  WJX:[id number]  DOB - 08/17/1966  Admit date - 10/14/2013  Admitting Physician Carron CurieAli Mckynlee Luse, MD  Outpatient Primary MD for the patient is No PCP Per Patient  LOS - 66   Abdominal abscess         Subjective:   [name] today has no new complaints. NG tube is off. Patient tolerating by mouth clear liquid diet without any problems  Objective:   Vital signs  Temperature 98.2 Heart rate 68 Respiratory rate 19 Blood pressure 116/84 Pulse ox 96%    Exam Awake Alert, Oriented X 3, No new F.N deficits, depressed [place].AT,PERRAL Supple Neck,No JVD, No cervical lymphadenopathy appriciated.  Symmetrical Chest wall movement, Good air movement bilaterally, CTAB IRREGULARLY IRREGULAR,No Gallops,Rubs or new Murmurs, No Parasternal Heave Degrees B.Sounds, Abd Soft, Non tender, No organomegaly appriciated, .  JP drains removed No Cyanosis, Clubbing or edema, No new Rash or bruise    I&Os 1160/1350  Data Review   CBC  Recent Labs Lab 12/15/13 0803 12/18/13 0846  WBC 6.7 6.8  HGB 12.5* 13.5  HCT 36.7* 40.4  PLT 184 212  MCV 86.4 86.5  MCH 29.4 28.9  MCHC 34.1 33.4  RDW 14.6 14.8    Chemistries   Recent Labs Lab 12/15/13 0803 12/18/13 0846  NA 136* 135*  K 4.5 4.5  CL 97 95*  CO2 27 27  GLUCOSE 106* 131*  BUN 16 17  CREATININE 1.12 1.27  CALCIUM 10.1 10.1   Coagulation profile No results found for this basename: INR, PROTIME,  in the last 168 hours  No results found for this basename: DDIMER,  in the last 72 hours  Cardiac Enzymes No results found for this basename: CK, CKMB, TROPONINI, MYOGLOBIN,  in the last 168  hours ------------------------------------------------------------------------------------------------------------------ No components found with this basename: POCBNP,   Micro Results No results found for this or any previous visit (from the past 240 hour(s)).     Assessment & Plan   Abdominal abscesses status post perforated gastric ulcer, status post abdominal drains removed. Status post          aspiration of pelvic collection on 4/16 Abdomen wound with intra-abdominal fistula on by mouth antibiotics Generalized weakness continue with PT/ OT as tolerated Protein calorie malnutrition tolerating dysphagia 2 diet with thin liquids without any problems A. fib with controlled rate coronary artery disease status post NSTEMI Depression GERD History of C. difficile colitis on by mouth Flagyl  Right IJ thrombus on Lovenox Acute on chronic pain syndrome Nausea recurrent Cholecystitis status percutaneous cholecystostomy tube placement by IR on4/7 to continue for 6 weeks total  Plan Continue same treatment Need to discuss with surgery prior to discharge   Code Status: Full  DVT Prophylaxis   Lovenox   Carron CurieAli James Mccormick M.D on 12/19/2013 at 1:53 PM

## 2013-12-20 NOTE — Progress Notes (Addendum)
[facility]                                                                                              Progress note     Patient Demographics  James Mccormick, is a 48 y.o. male  ZOX:[id number]:[id number]  WJX:[id number]  DOB - 05/14/1966  Admit date - 10/14/2013  Admitting Physician Carron CurieAli Serigne Kubicek, MD  Outpatient Primary MD for the patient is No PCP Per Patient  LOS - 67   Abdominal abscess         Subjective:   Cristal Deerhristopher Hawkes today has no new complaints. NG tube is off. Patient tolerating by mouth clear liquid diet without any problems  Objective:   Vital signs  Temperature 98.2 Heart rate 67 Respiratory rate 20 Blood pressure 112/73 Pulse ox 93%    Exam Awake Alert, Oriented X 3, No new F.N deficits, depressed [place].AT,PERRAL Supple Neck,No JVD, No cervical lymphadenopathy appriciated.  Symmetrical Chest wall movement, Good air movement bilaterally, CTAB IRREGULARLY IRREGULAR,No Gallops,Rubs or new Murmurs, No Parasternal Heave Degrees B.Sounds, Abd Soft, Non tender, No organomegaly appriciated, .  JP drains removed No Cyanosis, Clubbing or edema, No new Rash or bruise    I&Os 1120/925  Data Review   CBC  Recent Labs Lab 12/15/13 0803 12/18/13 0846  WBC 6.7 6.8  HGB 12.5* 13.5  HCT 36.7* 40.4  PLT 184 212  MCV 86.4 86.5  MCH 29.4 28.9  MCHC 34.1 33.4  RDW 14.6 14.8    Chemistries   Recent Labs Lab 12/15/13 0803 12/18/13 0846  NA 136* 135*  K 4.5 4.5  CL 97 95*  CO2 27 27  GLUCOSE 106* 131*  BUN 16 17  CREATININE 1.12 1.27  CALCIUM 10.1 10.1   Coagulation profile No results found for this basename: INR, PROTIME,  in the last 168 hours  No results found for this basename: DDIMER,  in the last 72 hours  Cardiac Enzymes No results found for this basename: CK, CKMB, TROPONINI, MYOGLOBIN,  in the last 168  hours ------------------------------------------------------------------------------------------------------------------ No components found with this basename: POCBNP,   Micro Results No results found for this or any previous visit (from the past 240 hour(s)).     Assessment & Plan   Abdominal abscesses status post perforated gastric ulcer, status post abdominal drains removed. Status post          aspiration of pelvic collection on 4/16 Abdomen wound with intra-abdominal fistula on by mouth antibiotics Generalized weakness continue with PT/ OT as tolerated Protein calorie malnutrition tolerating dysphagia 2 diet with thin liquids without any problems A. fib with controlled rate coronary artery disease status post NSTEMI Depression GERD History of C. difficile colitis on by mouth Flagyl  Right IJ thrombus on Lovenox Acute on chronic pain syndrome Nausea recurrent Cholecystitis status percutaneous cholecystostomy tube placement by IR on4/7 to continue for 6 weeks total  Plan  D/C Amiodarone   Code Status: Full  DVT Prophylaxis   Lovenox   Carron CurieAli Anessa Charley M.D on 12/20/2013 at 1:58 PM

## 2013-12-21 NOTE — Progress Notes (Signed)
[facility]                                                                                              Progress note     Patient Demographics  James Mccormick, is a 48 y.o. male  ZOX:[id number]:[id number]  WJX:[id number]  DOB - 10/21/1965  Admit date - 10/14/2013  Admitting Physician James CurieAli Jennyfer Nickolson, MD  Outpatient Primary MD for the patient is No PCP Per Patient  LOS - 68   Abdominal abscess         Subjective:   James Mccormick today complains of anxiety. he was started on Zoloft yesterday. Still feels anxious Objective:   Vital signs  Temperature 96 Heart rate76 Respiratory rate 18 Blood pressure 92/70 Pulse ox 93%    Exam Awake Alert, Oriented X 3, No new F.N deficits, depressed James Mccormick.AT,PERRAL Supple Neck,No JVD, No cervical lymphadenopathy appriciated.  Symmetrical Chest wall movement, Good air movement bilaterally, CTAB IRREGULARLY IRREGULAR,No Gallops,Rubs or new Murmurs, No Parasternal Heave Degrees B.Sounds, Abd Soft, Non tender, No organomegaly appriciated, .  JP drains removed No Cyanosis, Clubbing or edema, No new Rash or bruise    I&Os 1540/1425  Data Review   CBC  Recent Labs Lab 12/15/13 0803 12/18/13 0846  WBC 6.7 6.8  HGB 12.5* 13.5  HCT 36.7* 40.4  PLT 184 212  MCV 86.4 86.5  MCH 29.4 28.9  MCHC 34.1 33.4  RDW 14.6 14.8    Chemistries   Recent Labs Lab 12/15/13 0803 12/18/13 0846  NA 136* 135*  K 4.5 4.5  CL 97 95*  CO2 27 27  GLUCOSE 106* 131*  BUN 16 17  CREATININE 1.12 1.27  CALCIUM 10.1 10.1   Coagulation profile No results found for this basename: INR, PROTIME,  in the last 168 hours  No results found for this basename: DDIMER,  in the last 72 hours  Cardiac Enzymes No results found for this basename: CK, CKMB, TROPONINI, MYOGLOBIN,  in the last 168  hours ------------------------------------------------------------------------------------------------------------------ No components found with this basename: POCBNP,   Micro Results No results found for this or any previous visit (from the past 240 hour(s)).     Assessment & Plan   Abdominal abscesses status post perforated gastric ulcer, status post abdominal drains removed. Status post          aspiration of pelvic collection on 4/16 Abdomen wound with intra-abdominal fistula on by mouth antibiotics Generalized weakness continue with PT/ OT as tolerated Protein calorie malnutrition tolerating dysphagia 2 diet with thin liquids without any problems A. fib with controlled rate coronary artery disease status post NSTEMI Depression/anxiety on Zoloft GERD History of C. difficile colitis on by mouth Flagyl  Right IJ thrombus on Lovenox Acute on chronic pain syndrome Nausea recurrent Cholecystitis status percutaneous cholecystostomy tube placement by IR on4/7 to continue for 6 weeks total  Plan  Add Klonopin   Code Status: Full  DVT Prophylaxis   Lovenox   James Mccormick M.D on 12/21/2013 at 1:22 PM

## 2013-12-24 LAB — PROTIME-INR
INR: 1.19 (ref 0.00–1.49)
Prothrombin Time: 14.8 seconds (ref 11.6–15.2)

## 2014-01-01 ENCOUNTER — Other Ambulatory Visit (INDEPENDENT_AMBULATORY_CARE_PROVIDER_SITE_OTHER): Payer: Self-pay | Admitting: General Surgery

## 2014-01-01 ENCOUNTER — Encounter (INDEPENDENT_AMBULATORY_CARE_PROVIDER_SITE_OTHER): Payer: Self-pay | Admitting: General Surgery

## 2014-01-01 ENCOUNTER — Ambulatory Visit (INDEPENDENT_AMBULATORY_CARE_PROVIDER_SITE_OTHER): Payer: Medicaid Other | Admitting: General Surgery

## 2014-01-01 VITALS — BP 102/63 | HR 61 | Temp 98.1°F | Resp 12 | Ht 71.0 in | Wt 172.8 lb

## 2014-01-01 DIAGNOSIS — K819 Cholecystitis, unspecified: Secondary | ICD-10-CM | POA: Insufficient documentation

## 2014-01-01 DIAGNOSIS — T8579XA Infection and inflammatory reaction due to other internal prosthetic devices, implants and grafts, initial encounter: Secondary | ICD-10-CM

## 2014-01-01 NOTE — Patient Instructions (Signed)
Finish antibiotics Drink meal replacement shakes Change midline dressing at least once a day while using wet-dry dressing Follow up with your PCP Decrease metoprolol to twice a day We will get gallbladder study in 2 weeks

## 2014-01-02 NOTE — Progress Notes (Signed)
Subjective:     Patient ID: James Mccormick, male   DOB: 03/01/1966, 48 y.o.   MRN: [id number]  HPI 48 year old Caucasian male comes in today for his first postoperative appointment. He had a very prolonged hospitalization. He underwent emergent laparotomy on January 8 for a perforated stomach. His repair fell apart and he underwent multiple percutaneous drain placements and was n.p.o. For quite some time. [facility]. His stomach slowly sealed up and he was started on an oral diet while in [facility]. He underwent several upper GIs and CT scans with oral contrast to make sure there is no evidence of a leak from the stomach and there is no evidence of leak. He has chronic sterile fluid collections in his abdomen. He unfortunately also developed a calculus cholecystitis in early April and underwent placement of a cholecystostomy tube on April 7.  The tube has been to gravity drainage since that time. The daughter reports that he is draining about 300 cc of bile per day. He is currently on empiric antibiotics for acalculus cholecystitis. He reports that his appetite is slowly improving but still not great. He is drinking 1 ensure a day in addition to eating solid food. They deny any fevers or chills. He reports normal bowel movements. He reports that he still has ongoing weakness. He has an open upper midline that is granulating in.  Review of Systems     Objective:   Physical Exam BP 102/63  Pulse 61  Temp(Src) 98.1 F (36.7 C) (Temporal)  Resp 12  Ht 5\' 11"  (1.803 m)  Wt 172 lb 12.8 oz (78.382 kg)  BMI 24.11 kg/m2 Alert, nad; minor cachetic cta b/l Reg Soft, nd. Perc GB drain - site ok - bile in bag No edema Upper midline wound - some fibrinous exudate -        Assessment:     S/p emergent exploratory laparotomy, debridement of necrotic stomach and primary repair of perforated  stomach Acalculus cholecystitis status post cholecystostomy tube placement April 7  Protein calorie malnutrition Open abdominal wound    Plan:     I reviewed the last labs which were done on April 30 which revealed a normal CBC with normal hemoglobin. He had a normal basic metabolic panel. Last cultures from his percutaneous drains in his abdomen were negative.  He has come a long way since January. We discussed the importance of eating in order to regain his strength and improve his overall nutritional status. I would like to hold off on an appetite stimulant for now and see how he does over the next week or 2.  We will schedule a cholangiogram through his cholecystostomy tube to evaluate the patency of his biliary system. Hopefully since he had acalculus cholecystitis the drain can be removed however a little bit concerned about ongoing high amount of drainage.  Continue current wound care to the upper midline wound. I reminded the patient and his daughter that underneath that thin tissue in his bowel  F/u with me in several weeks  Mary SellaEric M. Andrey CampanileWilson, MD, FACS General, Bariatric, & Minimally Invasive Surgery [facility] [place]

## 2014-01-15 ENCOUNTER — Emergency Department (HOSPITAL_COMMUNITY): Admission: EM | Admit: 2014-01-15 | Discharge: 2014-01-15 | Discharge status: Against Medical Advice | Payer: MEDICAID

## 2014-01-15 ENCOUNTER — Ambulatory Visit (HOSPITAL_COMMUNITY)
Admission: RE | Admit: 2014-01-15 | Discharge: 2014-01-15 | Disposition: A | Discharge status: Home / Self Care | Payer: Medicaid Other | Source: Ambulatory Visit | Attending: General Surgery | Admitting: General Surgery

## 2014-01-15 ENCOUNTER — Telehealth (INDEPENDENT_AMBULATORY_CARE_PROVIDER_SITE_OTHER): Payer: Self-pay | Admitting: General Surgery

## 2014-01-15 ENCOUNTER — Other Ambulatory Visit (HOSPITAL_COMMUNITY): Payer: Self-pay | Admitting: Diagnostic Radiology

## 2014-01-15 DIAGNOSIS — Z09 Encounter for follow-up examination after completed treatment for conditions other than malignant neoplasm: Secondary | ICD-10-CM | POA: Insufficient documentation

## 2014-01-15 DIAGNOSIS — T8579XA Infection and inflammatory reaction due to other internal prosthetic devices, implants and grafts, initial encounter: Secondary | ICD-10-CM

## 2014-01-15 MED ORDER — IOHEXOL 300 MG/ML  SOLN
10.0000 mL | Freq: Once | INTRAMUSCULAR | Status: AC | PRN
  Administered 2014-01-15: 10 mL

## 2014-01-15 NOTE — Telephone Encounter (Signed)
Message copied by Wilder Glade on Thu Jan 15, 2014 10:36 AM ------      Message from: Andrey Campanile, ERIC M      Created: Thu Jan 15, 2014  9:49 AM       Xray of bile ducts looks good- it(bile) drains appropriately. Tube was left in Gallbladder but capped. So we will see how he handles draining his gallbladder internally now. It shouldn't be a problem since his bile duct is open. However he calls in complaining of rt sided/epigastric pain, nausea - alert me, but pt will need to re-connect gallbladder drain to drainage bag ------

## 2014-01-15 NOTE — Telephone Encounter (Signed)
Read Dr. Andrey Campanile results notes if the patient called in

## 2014-01-22 ENCOUNTER — Other Ambulatory Visit (HOSPITAL_COMMUNITY): Payer: Self-pay | Admitting: Diagnostic Radiology

## 2014-01-22 ENCOUNTER — Ambulatory Visit (HOSPITAL_COMMUNITY)
Admission: RE | Admit: 2014-01-22 | Discharge: 2014-01-22 | Disposition: A | Discharge status: Home / Self Care | Payer: Medicaid Other | Source: Ambulatory Visit | Attending: Diagnostic Radiology | Admitting: Diagnostic Radiology

## 2014-01-22 DIAGNOSIS — T8579XA Infection and inflammatory reaction due to other internal prosthetic devices, implants and grafts, initial encounter: Secondary | ICD-10-CM

## 2014-01-22 DIAGNOSIS — Z438 Encounter for attention to other artificial openings: Secondary | ICD-10-CM | POA: Insufficient documentation

## 2014-01-22 NOTE — Progress Notes (Signed)
Patient ID: James Mccormick, male   DOB: [birth year], 48 y.o.   MRN: [id number]  Patient has tolerated capping of cholecystostomy tube since 5/28.  No pain or fever.  Tube removed without complication.  Dressing applied.

## 2014-01-22 NOTE — Procedures (Signed)
Cholecystostomy tube was removed without complication.  Gauze bandage placed over site.  Patient instructed to keep site clean and dry and to change bandage as needed.  Patient given telephone number to [facility] to call with any further questions.

## 2014-02-06 ENCOUNTER — Inpatient Hospital Stay: Payer: Self-pay | Admitting: Internal Medicine

## 2014-02-12 ENCOUNTER — Ambulatory Visit (INDEPENDENT_AMBULATORY_CARE_PROVIDER_SITE_OTHER): Payer: Medicaid Other | Admitting: General Surgery

## 2014-02-12 ENCOUNTER — Encounter (INDEPENDENT_AMBULATORY_CARE_PROVIDER_SITE_OTHER): Payer: Self-pay | Admitting: General Surgery

## 2014-02-12 VITALS — BP 123/80 | HR 72 | Temp 98.4°F | Resp 16 | Ht 71.0 in | Wt 185.4 lb

## 2014-02-12 DIAGNOSIS — Z5189 Encounter for other specified aftercare: Secondary | ICD-10-CM

## 2014-02-12 DIAGNOSIS — S31109A Unspecified open wound of abdominal wall, unspecified quadrant without penetration into peritoneal cavity, initial encounter: Secondary | ICD-10-CM

## 2014-02-12 DIAGNOSIS — S31109D Unspecified open wound of abdominal wall, unspecified quadrant without penetration into peritoneal cavity, subsequent encounter: Secondary | ICD-10-CM

## 2014-02-12 NOTE — Patient Instructions (Signed)
Keep up the good work Work on well balanced diet No NSAIDS i will call you after I review your labs

## 2014-02-12 NOTE — Progress Notes (Signed)
Subjective:     Patient ID: James Mccormick, male   DOB: 08/31/1965, 48 y.o.   MRN: [id number]  HPI 48 year old male comes in for long-term followup after having a very prolonged hospitalization for a ruptured lesser curvature of the stomach. His repair fell apart and he had a leak. [facility] Fortunately his leak resolved and he is now tolerating an oral diet. While [facility] was [facility] he developed a calculus cholecystitis which was managed with a percutaneous drain. He underwent drain study in late May which demonstrated patency of the bile ducts. His drain was then capped and he tolerated his capping trial and his perc drain was removed on June 4. He states he is doing much better. He is gaining weight. His weight at his last office visit was 172 pounds on May 14. He reports an improving appetite. He denies any nausea or vomiting. He reports normal bowel movements. His energy level is also increasing. They are currently pending a wet gauze dressing over his upper midline wound  PMHx, PSHx, SOCHx, FAMHx, ALL reviewed    Review of Systems A 8 point Review of systems was performed and all systems are negative except for what is mentioned in the history of present illness     Objective:   Physical Exam  Vitals reviewed. Constitutional: He is oriented to person, place, and time. He appears well-developed and well-nourished. No distress.  HENT:  Head: Normocephalic and atraumatic.  Eyes: Conjunctivae are normal.  Neck: Normal range of motion.  Cardiovascular: Normal rate and normal heart sounds.   Pulmonary/Chest: Effort normal and breath sounds normal. No respiratory distress.  Abdominal: Soft. He exhibits no distension. There is no tenderness. There is no rebound.  Neurological: He is alert and oriented to person, place, and time.  Skin: Skin is warm and dry. He is not diaphoretic.  Psychiatric: He has a normal mood and affect. His behavior is normal.  Judgment and thought content normal.  BP 123/80  Pulse 72  Temp(Src) 98.4 F (36.9 C) (Temporal)  Resp 16  Ht 5\' 11"  (1.803 m)  Wt 185 lb 6.4 oz (84.097 kg)  BMI 25.87 kg/m2        Assessment:     Status post exploratory laparotomy, repair of perforated stomach with a very prolonged hospital course which included intra-abdominal abscess, the breakdown of stomach repair, dehiscence of upper midline wound A calculus cholecystitis-resolved Upper midline wound     Plan:     Overall I think he is doing fantastic. There is no signs of recurrent cholecystitis. I think the chance of him having recurrent flare given the fact he had no gallstones is quite low. He is finally putting on some weight. His energy level is improving. He is no longer using a wheelchair to get around and walking independently. The only remaining issue is his upper midline wound. There is excellent granulation tissue present with surrounding epithelialized skin. However there is no muscle underneath this granulation tissue. In the upper part of the wound on CT scan there is a large amount of omentum however toward the midpoint of the wound and lower part of the wound underneath the granulation tissue is colon. I'm going to ask Dr. Kelly SplinterSanger in the wound care center for wound care advice. I'd for him to come all this way and develop a colocutaneous fistula. I'm not sure if he needs a skin graft or just more aggressive wound care. I explained that  he would not be a candidate for hernia repair in my mind at least for another year nor is he interested in a additional procedure at this time. Followup 8 weeks.  Mary SellaEric M. Andrey CampanileWilson, MD, FACS General, Bariatric, & Minimally Invasive Surgery [facility], [place]

## 2014-02-13 LAB — CMP AND LIVER
ALBUMIN: 3.5 g/dL (ref 3.5–5.2)
ALK PHOS: 95 U/L (ref 39–117)
ALT: 15 U/L (ref 0–53)
AST: 18 U/L (ref 0–37)
BILIRUBIN INDIRECT: 0.4 mg/dL (ref 0.2–1.2)
BUN: 13 mg/dL (ref 6–23)
Bilirubin, Direct: 0.1 mg/dL (ref 0.0–0.3)
CO2: 29 mEq/L (ref 19–32)
Calcium: 8.5 mg/dL (ref 8.4–10.5)
Chloride: 105 mEq/L (ref 96–112)
Creat: 1.61 mg/dL — ABNORMAL HIGH (ref 0.50–1.35)
Glucose, Bld: 89 mg/dL (ref 70–99)
POTASSIUM: 3.1 meq/L — AB (ref 3.5–5.3)
SODIUM: 143 meq/L (ref 135–145)
Total Bilirubin: 0.5 mg/dL (ref 0.2–1.2)
Total Protein: 6.3 g/dL (ref 6.0–8.3)

## 2014-02-19 ENCOUNTER — Telehealth (INDEPENDENT_AMBULATORY_CARE_PROVIDER_SITE_OTHER): Payer: Self-pay | Admitting: General Surgery

## 2014-02-19 ENCOUNTER — Other Ambulatory Visit (INDEPENDENT_AMBULATORY_CARE_PROVIDER_SITE_OTHER): Payer: Self-pay | Admitting: General Surgery

## 2014-02-19 DIAGNOSIS — S31109S Unspecified open wound of abdominal wall, unspecified quadrant without penetration into peritoneal cavity, sequela: Secondary | ICD-10-CM

## 2014-02-19 DIAGNOSIS — R7989 Other specified abnormal findings of blood chemistry: Secondary | ICD-10-CM

## 2014-02-19 NOTE — Telephone Encounter (Signed)
LMOM for patient to call back and ask for James Mccormick 

## 2014-02-19 NOTE — Telephone Encounter (Signed)
[name] called back and I told him that he will need to get BMET done next week, he stated that his daughter will come by the office to pick up his STD forms, I told him that to tell his daughter to pick up his lab sheet and get lab work done next week and increase his water intake. And I will send him to see Dr Kelly SplinterSanger for his Abdominal wall wound and Dr. Kelly SplinterSanger office will call him with the apt

## 2014-02-24 ENCOUNTER — Telehealth (INDEPENDENT_AMBULATORY_CARE_PROVIDER_SITE_OTHER): Payer: Self-pay | Admitting: *Deleted

## 2014-02-24 NOTE — Telephone Encounter (Signed)
Dr. Leonie GreenSanger's office James Reining(Nicole) called to advise me that pt didn't show up for his appt. We are assuming it's because the pt did not know about it.  James Reiningicole advised that we would wait on the pt to return our call before we will reschedule this appt.  James Mccormick

## 2014-02-24 NOTE — Telephone Encounter (Signed)
LMOM regarding pt's referral to see Plastic Surgeon.  Advise pt his appt is 02-24-14 9:15 with Dr. Kelly SplinterSanger, [address] [phone]   Victorino DikeJennifer

## 2014-04-17 ENCOUNTER — Encounter (INDEPENDENT_AMBULATORY_CARE_PROVIDER_SITE_OTHER): Payer: Self-pay | Admitting: General Surgery

## 2014-04-17 ENCOUNTER — Ambulatory Visit (INDEPENDENT_AMBULATORY_CARE_PROVIDER_SITE_OTHER): Payer: Medicaid Other | Admitting: General Surgery

## 2014-04-17 VITALS — BP 130/70 | HR 81 | Temp 97.5°F | Ht 71.0 in | Wt 208.0 lb

## 2014-04-17 DIAGNOSIS — S31109D Unspecified open wound of abdominal wall, unspecified quadrant without penetration into peritoneal cavity, subsequent encounter: Secondary | ICD-10-CM

## 2014-04-17 DIAGNOSIS — R209 Unspecified disturbances of skin sensation: Secondary | ICD-10-CM

## 2014-04-17 DIAGNOSIS — Z5189 Encounter for other specified aftercare: Secondary | ICD-10-CM

## 2014-04-17 DIAGNOSIS — S31109A Unspecified open wound of abdominal wall, unspecified quadrant without penetration into peritoneal cavity, initial encounter: Secondary | ICD-10-CM

## 2014-04-17 DIAGNOSIS — R200 Anesthesia of skin: Secondary | ICD-10-CM

## 2014-04-17 NOTE — Patient Instructions (Signed)
Get labs drawn Let me know if symptoms worsen or don't resolve Can do any cardio activity Can lift less than 50 pounds

## 2014-04-19 NOTE — Progress Notes (Signed)
Subjective:     Patient ID: James Mccormick, male   DOB: [birth year], 48 y.o.   MRN: [id number]  HPI 48 yo WM comes in for follow up for his emergency surgery for a ruptured stomach several months ago. He states he is doing well. He denies abdominal pain. He denies any nausea,vomiting, diarrhea or constipation. He has gained some weight. He reports increased appetite. His biggest issue is bilateral foot numbness starts at midfoot and extends distally. Reports numbness is primarily on bottom of feet. Also c/o of decreased right Whipple grip. Weight at last visit 165 pounds  PMHx, PSHx, SOCHx, FAMHx, ALL reviewed  Review of Systems A 10 point Review of systems was performed and all systems are negative except for what is mentioned in the history of present illness     Objective:   Physical Exam  Vitals reviewed. Constitutional: He appears well-developed and well-nourished. No distress.  Cardiovascular: Normal rate.   Pulmonary/Chest: Effort normal. No respiratory distress.  Abdominal: Soft. He exhibits no distension. There is no tenderness.  Upper midline abd wound almost completely epitheliazed.   Skin: He is not diaphoretic.  BP 130/70  Pulse 81  Temp(Src) 97.5 F (36.4 C)  Ht  (1.803 m)  Wt 208 lb (94.348 kg)  BMI 29.02 kg/m2       Assessment:     S/p emergency exp lap, primary suture of ruptured stomach     Plan:     Overall he is doing well. His wound is almost epithelized. i have released him to any cardiovascular activity but cont to encourage him to abstain from lifting/pushing/pulling anything greater than 30 pounds. His Hogans grip strength on  The right is diminshed compared to left but sensation appears intact on that side. Moreover his b/l foot numbness is unclear - strength appears normal. It could all be from his prolonged hospital stay. We will check a few vitamin levels to make sure he doesn't have a deficiency. If they are normal, may need referral to neurology.  He already has plans to see his orthopedic doctor. F/u 4 months  Mary Sella. Andrey Campanile, MD, FACS General, Bariatric, & Minimally Invasive Surgery [facility], [place]

## 2014-04-30 LAB — VITAMIN B12: VITAMIN B 12: 648 pg/mL (ref 211–911)

## 2014-04-30 LAB — FOLATE: Folate: 7.7 ng/mL

## 2014-05-03 LAB — VITAMIN B1: VITAMIN B1 (THIAMINE): 14 nmol/L (ref 8–30)

## 2014-05-05 ENCOUNTER — Telehealth (INDEPENDENT_AMBULATORY_CARE_PROVIDER_SITE_OTHER): Payer: Self-pay

## 2014-05-05 NOTE — Telephone Encounter (Signed)
LMOV pt's vitamin levels are all normal.  Results in Epic.

## 2014-12-24 ENCOUNTER — Other Ambulatory Visit: Payer: Self-pay

## 2014-12-24 DIAGNOSIS — K219 Gastro-esophageal reflux disease without esophagitis: Secondary | ICD-10-CM

## 2014-12-24 DIAGNOSIS — IMO0001 Reserved for inherently not codable concepts without codable children: Secondary | ICD-10-CM

## 2014-12-24 DIAGNOSIS — R1110 Vomiting, unspecified: Secondary | ICD-10-CM

## 2014-12-24 NOTE — Addendum Note (Signed)
Addended by: Sheritha Louis M on: 12/24/2014 12:26 PM   Modules accepted: Orders  

## 2014-12-25 ENCOUNTER — Other Ambulatory Visit: Payer: Self-pay | Admitting: General Surgery

## 2014-12-25 ENCOUNTER — Other Ambulatory Visit: Payer: Self-pay | Admitting: *Deleted

## 2014-12-25 DIAGNOSIS — IMO0001 Reserved for inherently not codable concepts without codable children: Secondary | ICD-10-CM

## 2014-12-25 DIAGNOSIS — K219 Gastro-esophageal reflux disease without esophagitis: Principal | ICD-10-CM

## 2014-12-29 ENCOUNTER — Ambulatory Visit
Admission: RE | Admit: 2014-12-29 | Discharge: 2014-12-29 | Disposition: A | Discharge status: Home / Self Care | Payer: Medicaid Other | Source: Ambulatory Visit | Attending: General Surgery | Admitting: General Surgery

## 2014-12-29 DIAGNOSIS — IMO0001 Reserved for inherently not codable concepts without codable children: Secondary | ICD-10-CM

## 2014-12-29 DIAGNOSIS — K219 Gastro-esophageal reflux disease without esophagitis: Principal | ICD-10-CM

## 2015-03-05 ENCOUNTER — Other Ambulatory Visit (HOSPITAL_COMMUNITY): Payer: Self-pay | Admitting: Surgery

## 2015-03-05 DIAGNOSIS — K439 Ventral hernia without obstruction or gangrene: Secondary | ICD-10-CM

## 2015-03-12 ENCOUNTER — Encounter (HOSPITAL_COMMUNITY): Payer: Self-pay

## 2015-03-12 ENCOUNTER — Ambulatory Visit (HOSPITAL_COMMUNITY)
Admission: RE | Admit: 2015-03-12 | Discharge: 2015-03-12 | Disposition: A | Discharge status: Home / Self Care | Payer: Medicaid Other | Source: Ambulatory Visit | Attending: Surgery | Admitting: Surgery

## 2015-03-12 DIAGNOSIS — K439 Ventral hernia without obstruction or gangrene: Secondary | ICD-10-CM | POA: Diagnosis not present

## 2015-03-12 DIAGNOSIS — R109 Unspecified abdominal pain: Secondary | ICD-10-CM | POA: Diagnosis present

## 2015-03-12 MED ORDER — IOHEXOL 300 MG/ML  SOLN
100.0000 mL | Freq: Once | INTRAMUSCULAR | Status: AC | PRN
  Administered 2015-03-12: 100 mL via INTRAVENOUS

## 2015-04-19 ENCOUNTER — Ambulatory Visit: Payer: Self-pay | Admitting: *Deleted

## 2015-07-21 IMAGING — CT CT ABD-PELV W/ CM
2 of 5 series · 16 of 46 positions shown, 18 images · IV contrast (omnipaque)
Comparison: Abdominal radiograph 08/28/2013

CLINICAL DATA: Abdominal pain and pneumoperitoneum.

EXAM:
CT ABDOMEN AND PELVIS WITH CONTRAST
TECHNIQUE: Multidetector CT imaging of the abdomen and pelvis was performed
using the standard protocol following bolus administration of
intravenous contrast.
CONTRAST:  100mL OMNIPAQUE IOHEXOL 300 MG/ML  SOLN

[Series 2: routine · axial · 0.86mm/px · z∈[-754,-250]mm · 13 of 115 slices shown, 15 images]
[im 7/115  soft-tissue]
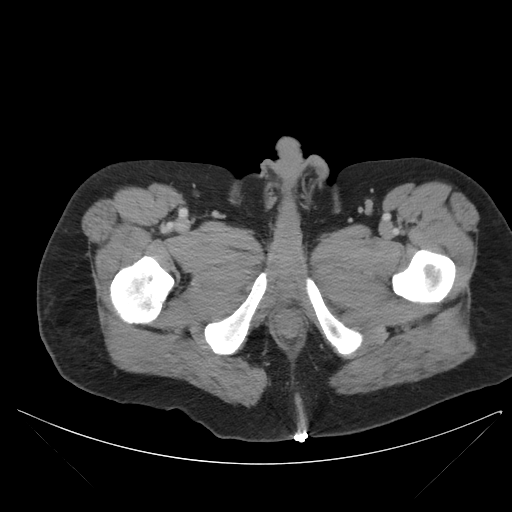
[im 7/115  bone]
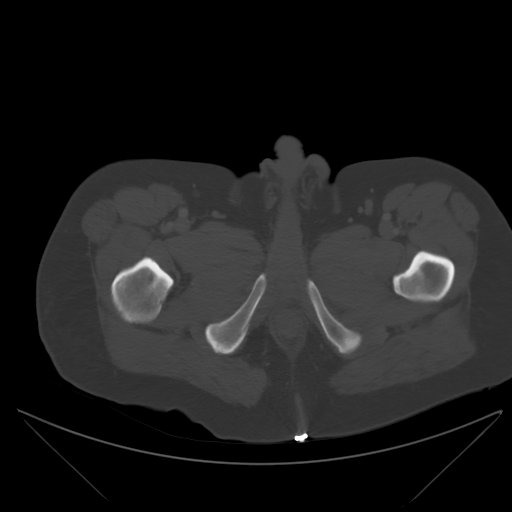
[im 14/115  soft-tissue]
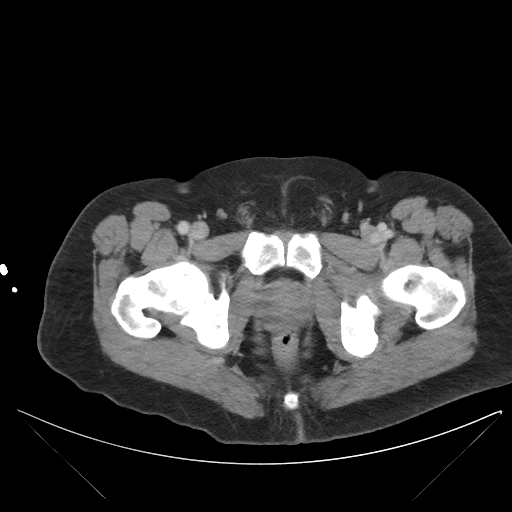
[im 27/115  soft-tissue]
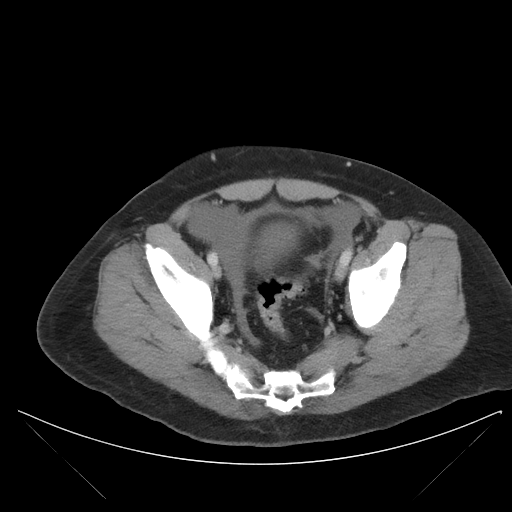
[im 34/115  soft-tissue]
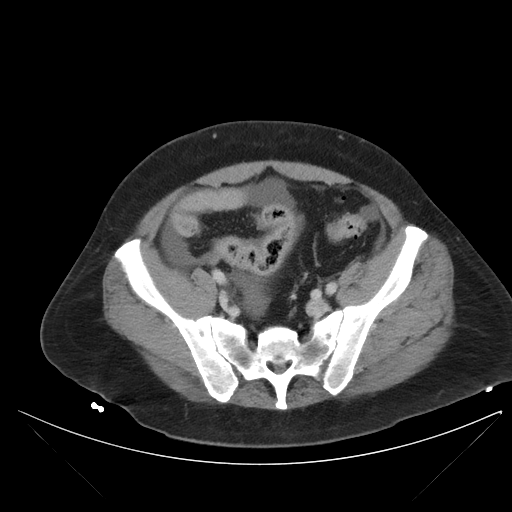
[im 41/115  soft-tissue]
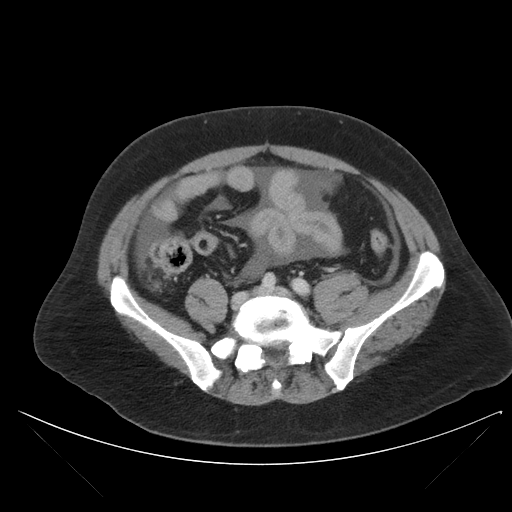
[im 47/115  soft-tissue]
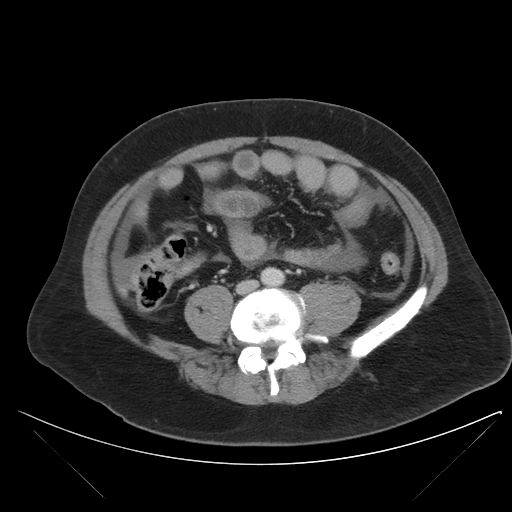
[im 61/115  soft-tissue]
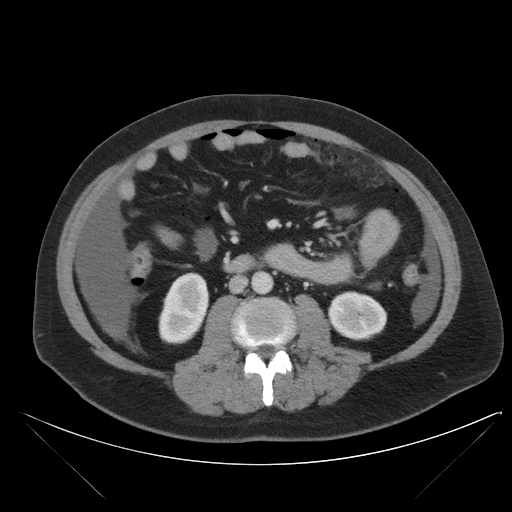
[im 68/115  soft-tissue]
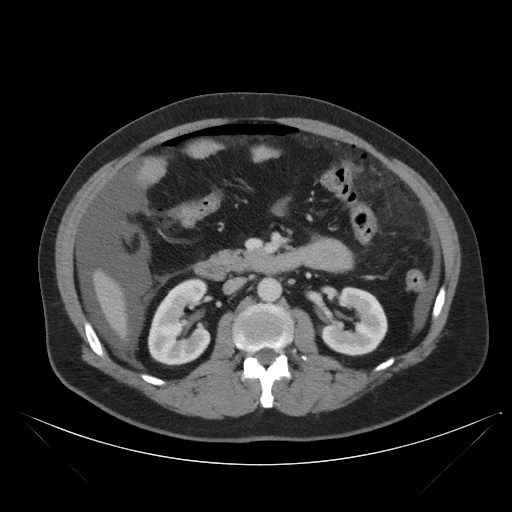
[im 74/115  soft-tissue]
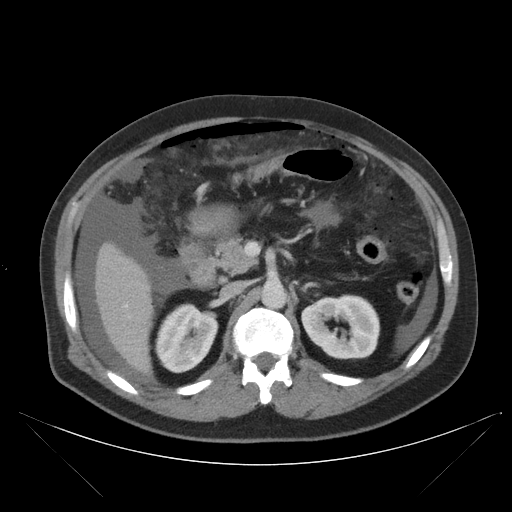
[im 74/115  bone]
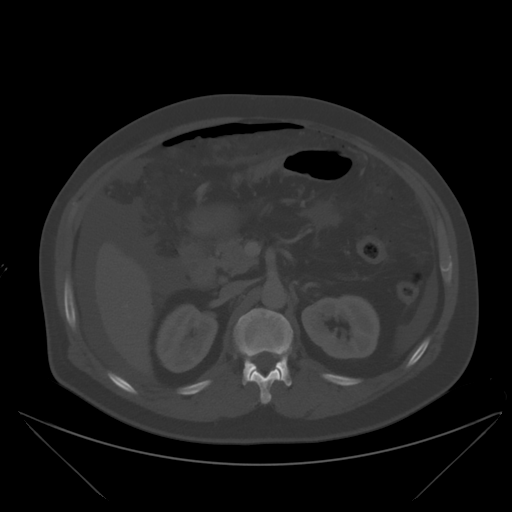
[im 81/115  soft-tissue]
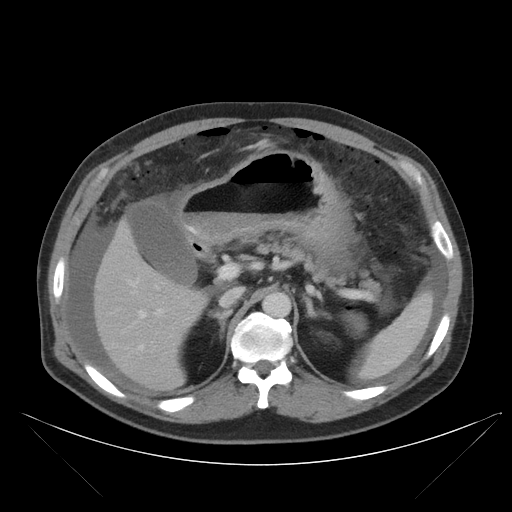
[im 88/115  soft-tissue]
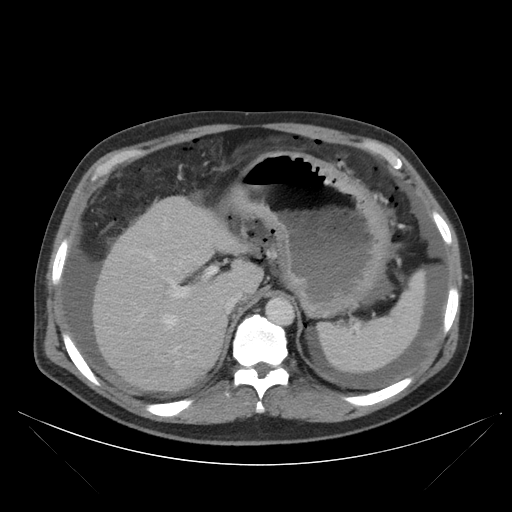
[im 101/115  soft-tissue]
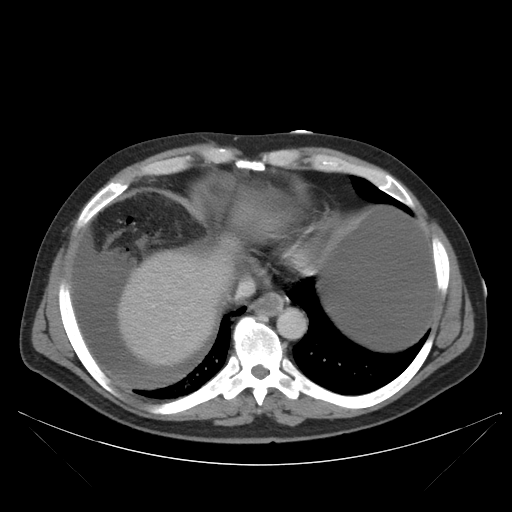
[im 108/115  soft-tissue]
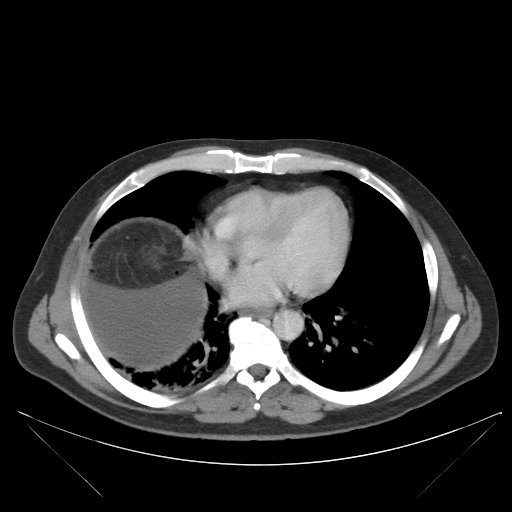

[mpr, coronals, coronal · coronal · 1.11mm/px · 3 of 114 slices shown]
[im 38/114  soft-tissue]
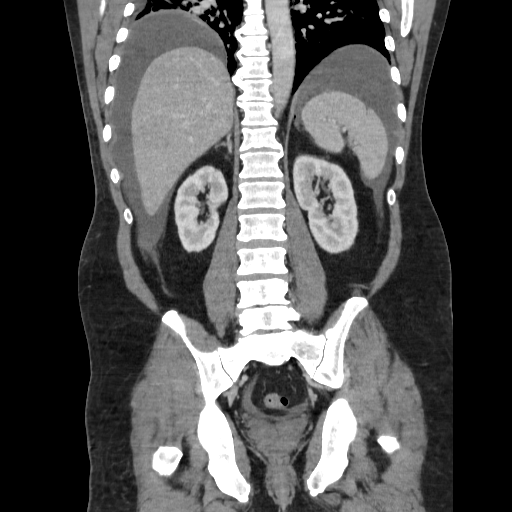
[im 51/114  soft-tissue]
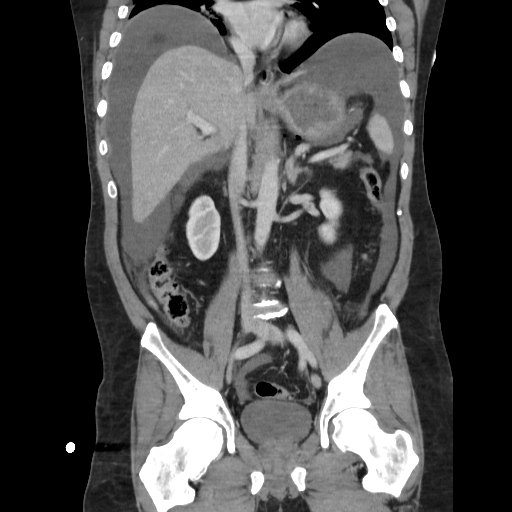
[im 63/114  soft-tissue]
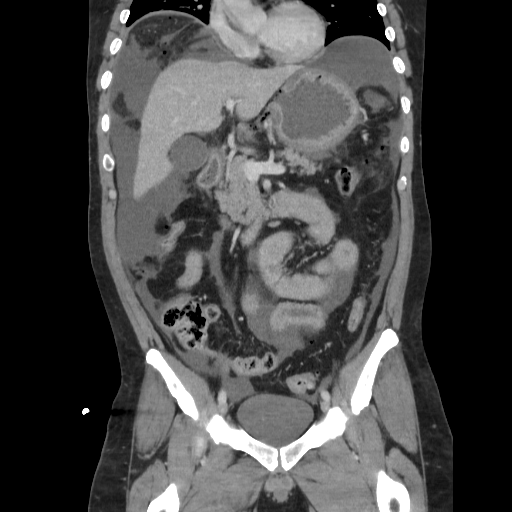

[16 of 46 positions shown; findings below may reference images not displayed]

FINDINGS: There is a small right pleural effusion with extensive atelectasis
at the right lung base. Mild atelectasis at the left lung base.

There is pneumoperitoneum with a collection a gas along the anterior
abdomen and multiple locules of gas throughout the upper abdomen.
There is a moderate amount of abdominal ascites. Mild distension of
the esophagus which contains fluid. There may be wall thickening
along the anterior gastric body and there is gas in the
gastrohepatic ligament. There is edema and stranding throughout the
anterior upper abdomen. There is an air-fluid level within the
stomach and some of the fluid appears to have fatty content. No
gross abnormality into the duodenum. Mild dilatation of small bowel
loops. There is colonic diverticulosis but no evidence to suggest
acute colonic inflammation. No gross abnormality to the appendix.

No significant lymphadenopathy. No gross abnormality to the
prostate, seminal vesicles or urinary bladder. No gross abnormality
to the liver, gallbladder or portal venous system. Normal appearance
of the spleen, adrenal glands, kidneys and pancreas. Incidentally,
there are punctate nonobstructive left kidney stones. No acute bone
abnormality. Degenerative disc changes in the lower lumbar spine.
IMPRESSION: Study is positive for pneumoperitoneum and free fluid. Findings are
suggestive for a perforated viscus. The majority of inflammatory
changes and gas are located in the upper abdomen and centered around
the stomach. There may be mild wall thickening along the anterior
aspect of the stomach.

Colonic diverticulosis but there is not clear evidence for acute
colonic inflammation.

Right basilar atelectasis with a small amount a right pleural fluid.

Mild dilatation of small bowel loops could represent an ileus.

Nonobstructive left kidney stones.

Critical Value/emergent results were called by telephone at the time
of interpretation on 08/28/2013 at [DATE] to Dr. Malcom Martine, who
verbally acknowledged these results.

## 2015-07-21 IMAGING — CR DG ABDOMEN ACUTE W/ 1V CHEST
1 series · 1 of 1 positions shown · non-contrast
Comparison: None.

CLINICAL DATA: Severe abdominal pain.

EXAM:
ACUTE ABDOMEN SERIES (ABDOMEN 2 VIEW & CHEST 1 VIEW)

[w abdomen decub]
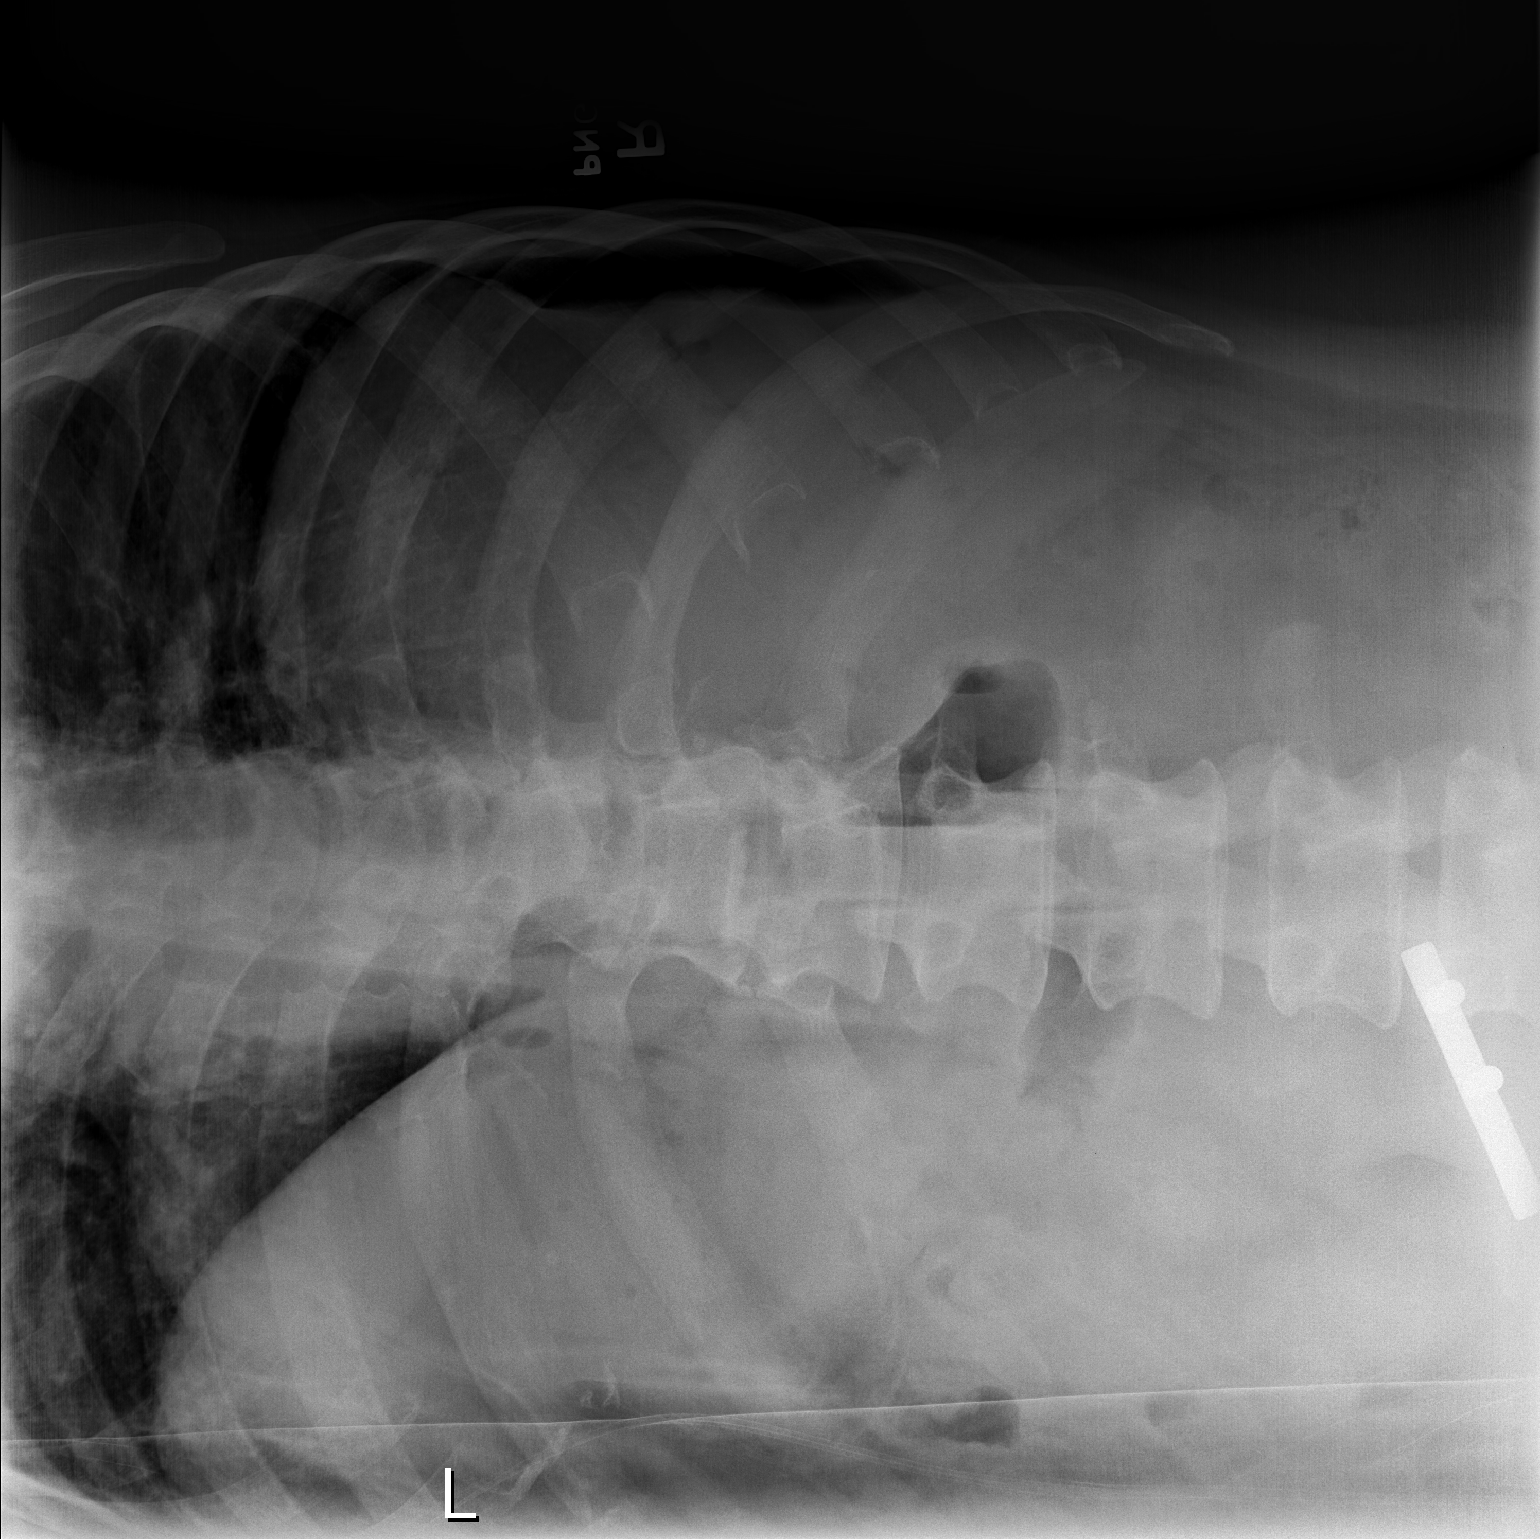

[1 of 1 positions shown; findings below may reference images not displayed]

FINDINGS: The left lower decubitus image demonstrates lucency in the right
upper abdomen and most compatible with free air. Chest radiograph
demonstrates very low lung volumes. Prominent interstitial markings
are probably related to the low lung volumes. Heart size is within
normal limits. Small amount of bowel gas in the abdomen. There is
stool along the right side of the colon.
IMPRESSION: Study is positive for free air. Findings are concerning for a bowel
perforation and recommend surgical consultation.

Low lung volumes.

Critical Value/emergent results were called by telephone at the time
of interpretation on 08/28/2013 at [DATE] to Dr. SHAMEL PALMIERI , who
verbally acknowledged these results.

## 2015-08-02 ENCOUNTER — Encounter (HOSPITAL_COMMUNITY): Payer: Self-pay | Admitting: Nurse Practitioner

## 2015-08-02 ENCOUNTER — Emergency Department (HOSPITAL_COMMUNITY)
Admission: EM | Admit: 2015-08-02 | Discharge: 2015-08-02 | Disposition: A | Discharge status: Home / Self Care | Payer: 59 | Attending: Emergency Medicine | Admitting: Emergency Medicine

## 2015-08-02 DIAGNOSIS — S66922A Laceration of unspecified muscle, fascia and tendon at wrist and hand level, left hand, initial encounter: Secondary | ICD-10-CM

## 2015-08-02 DIAGNOSIS — Z862 Personal history of diseases of the blood and blood-forming organs and certain disorders involving the immune mechanism: Secondary | ICD-10-CM | POA: Diagnosis not present

## 2015-08-02 DIAGNOSIS — Z23 Encounter for immunization: Secondary | ICD-10-CM | POA: Insufficient documentation

## 2015-08-02 DIAGNOSIS — Z87448 Personal history of other diseases of urinary system: Secondary | ICD-10-CM | POA: Diagnosis not present

## 2015-08-02 DIAGNOSIS — Y998 Other external cause status: Secondary | ICD-10-CM | POA: Diagnosis not present

## 2015-08-02 DIAGNOSIS — S66222A Laceration of extensor muscle, fascia and tendon of left thumb at wrist and hand level, initial encounter: Secondary | ICD-10-CM | POA: Diagnosis not present

## 2015-08-02 DIAGNOSIS — F329 Major depressive disorder, single episode, unspecified: Secondary | ICD-10-CM | POA: Insufficient documentation

## 2015-08-02 DIAGNOSIS — Z8711 Personal history of peptic ulcer disease: Secondary | ICD-10-CM | POA: Diagnosis not present

## 2015-08-02 DIAGNOSIS — Z8619 Personal history of other infectious and parasitic diseases: Secondary | ICD-10-CM | POA: Diagnosis not present

## 2015-08-02 DIAGNOSIS — Z8639 Personal history of other endocrine, nutritional and metabolic disease: Secondary | ICD-10-CM | POA: Diagnosis not present

## 2015-08-02 DIAGNOSIS — Z86718 Personal history of other venous thrombosis and embolism: Secondary | ICD-10-CM | POA: Insufficient documentation

## 2015-08-02 DIAGNOSIS — W260XXA Contact with knife, initial encounter: Secondary | ICD-10-CM | POA: Insufficient documentation

## 2015-08-02 DIAGNOSIS — Y9389 Activity, other specified: Secondary | ICD-10-CM | POA: Insufficient documentation

## 2015-08-02 DIAGNOSIS — Z8709 Personal history of other diseases of the respiratory system: Secondary | ICD-10-CM | POA: Insufficient documentation

## 2015-08-02 DIAGNOSIS — S61412A Laceration without foreign body of left hand, initial encounter: Secondary | ICD-10-CM | POA: Diagnosis present

## 2015-08-02 DIAGNOSIS — Z87891 Personal history of nicotine dependence: Secondary | ICD-10-CM | POA: Diagnosis not present

## 2015-08-02 DIAGNOSIS — Y9289 Other specified places as the place of occurrence of the external cause: Secondary | ICD-10-CM | POA: Insufficient documentation

## 2015-08-02 DIAGNOSIS — K219 Gastro-esophageal reflux disease without esophagitis: Secondary | ICD-10-CM | POA: Insufficient documentation

## 2015-08-02 DIAGNOSIS — I10 Essential (primary) hypertension: Secondary | ICD-10-CM | POA: Diagnosis not present

## 2015-08-02 MED ORDER — TETANUS-DIPHTH-ACELL PERTUSSIS 5-2.5-18.5 LF-MCG/0.5 IM SUSP
0.5000 mL | Freq: Once | INTRAMUSCULAR | Status: AC
  Administered 2015-08-02: 0.5 mL via INTRAMUSCULAR
  Filled 2015-08-02: qty 0.5

## 2015-08-02 MED ORDER — OXYCODONE-ACETAMINOPHEN 5-325 MG PO TABS: 1.0000 | ORAL_TABLET | ORAL | Status: DC | PRN

## 2015-08-02 MED ORDER — LIDOCAINE-EPINEPHRINE 1 %-1:100000 IJ SOLN
10.0000 mL | Freq: Once | INTRAMUSCULAR | Status: AC
  Administered 2015-08-02: 10 mL
  Filled 2015-08-02: qty 1

## 2015-08-02 NOTE — ED Notes (Signed)
MD at bedside. 

## 2015-08-02 NOTE — ED Notes (Signed)
Wrapped pt's wound.
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
## 2015-08-02 NOTE — ED Notes (Signed)
Left Venters wound unwrapped at bedside by Dr. Clarene DukeLittle for assessment, pressure and bandage reapplied after Physician's assessment of [place].

## 2015-08-02 NOTE — ED Notes (Addendum)
Pt sent from PCP office for laceration to [facility] with a knife, they were concerned for arterial laceration due to heavy bleeding and difficulty controlling bleeding in the office. He has pressure dressing in place now, no blood soaked through. He reports minimal pain. Wound undressed and assessed with Haynes DageHannah Patel-Mills, PA, arterial bleeding is noted, wound re-wrapped with pressure, radial pulses intact, cap refill <2.

## 2015-08-02 NOTE — Discharge Instructions (Signed)

## 2015-11-23 ENCOUNTER — Emergency Department (HOSPITAL_COMMUNITY): Payer: BLUE CROSS/BLUE SHIELD

## 2015-11-23 ENCOUNTER — Encounter (HOSPITAL_COMMUNITY): Payer: Self-pay | Admitting: Emergency Medicine

## 2015-11-23 ENCOUNTER — Emergency Department (HOSPITAL_COMMUNITY)
Admission: EM | Admit: 2015-11-23 | Discharge: 2015-11-23 | Disposition: A | Discharge status: Home / Self Care | Payer: BLUE CROSS/BLUE SHIELD | Attending: Emergency Medicine | Admitting: Emergency Medicine

## 2015-11-23 DIAGNOSIS — Z8639 Personal history of other endocrine, nutritional and metabolic disease: Secondary | ICD-10-CM | POA: Diagnosis not present

## 2015-11-23 DIAGNOSIS — Z87448 Personal history of other diseases of urinary system: Secondary | ICD-10-CM | POA: Diagnosis not present

## 2015-11-23 DIAGNOSIS — Z8619 Personal history of other infectious and parasitic diseases: Secondary | ICD-10-CM | POA: Insufficient documentation

## 2015-11-23 DIAGNOSIS — Y998 Other external cause status: Secondary | ICD-10-CM | POA: Diagnosis not present

## 2015-11-23 DIAGNOSIS — S81832A Puncture wound without foreign body, left lower leg, initial encounter: Secondary | ICD-10-CM | POA: Diagnosis not present

## 2015-11-23 DIAGNOSIS — W3400XA Accidental discharge from unspecified firearms or gun, initial encounter: Secondary | ICD-10-CM

## 2015-11-23 DIAGNOSIS — Y9389 Activity, other specified: Secondary | ICD-10-CM | POA: Diagnosis not present

## 2015-11-23 DIAGNOSIS — Z86718 Personal history of other venous thrombosis and embolism: Secondary | ICD-10-CM | POA: Insufficient documentation

## 2015-11-23 DIAGNOSIS — I10 Essential (primary) hypertension: Secondary | ICD-10-CM | POA: Diagnosis not present

## 2015-11-23 DIAGNOSIS — Z8709 Personal history of other diseases of the respiratory system: Secondary | ICD-10-CM | POA: Insufficient documentation

## 2015-11-23 DIAGNOSIS — K219 Gastro-esophageal reflux disease without esophagitis: Secondary | ICD-10-CM | POA: Diagnosis not present

## 2015-11-23 DIAGNOSIS — Y9289 Other specified places as the place of occurrence of the external cause: Secondary | ICD-10-CM | POA: Diagnosis not present

## 2015-11-23 DIAGNOSIS — S8992XA Unspecified injury of left lower leg, initial encounter: Secondary | ICD-10-CM | POA: Diagnosis present

## 2015-11-23 DIAGNOSIS — Z79899 Other long term (current) drug therapy: Secondary | ICD-10-CM | POA: Insufficient documentation

## 2015-11-23 DIAGNOSIS — F329 Major depressive disorder, single episode, unspecified: Secondary | ICD-10-CM | POA: Insufficient documentation

## 2015-11-23 DIAGNOSIS — Z8669 Personal history of other diseases of the nervous system and sense organs: Secondary | ICD-10-CM | POA: Diagnosis not present

## 2015-11-23 DIAGNOSIS — W3409XA Accidental discharge from other specified firearms, initial encounter: Secondary | ICD-10-CM | POA: Insufficient documentation

## 2015-11-23 MED ORDER — OXYCODONE-ACETAMINOPHEN 5-325 MG PO TABS: 1.0000 | ORAL_TABLET | ORAL | Status: AC | PRN

## 2015-11-23 MED ORDER — FENTANYL CITRATE (PF) 100 MCG/2ML IJ SOLN
100.0000 ug | Freq: Once | INTRAMUSCULAR | Status: AC
  Administered 2015-11-23: 100 ug via INTRAVENOUS
  Filled 2015-11-23: qty 2

## 2015-11-23 MED ORDER — HYDROMORPHONE HCL 1 MG/ML IJ SOLN
1.0000 mg | Freq: Once | INTRAMUSCULAR | Status: AC
  Administered 2015-11-23: 1 mg via INTRAVENOUS
  Filled 2015-11-23: qty 1
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
## 2015-11-23 NOTE — Discharge Instructions (Signed)
Return to the ER immediately if you develop increased swelling, pain, or numbness or weakness in your left leg. Otherwise follow-up with your primary doctor in 3 days for a wound check.

## 2016-07-04 ENCOUNTER — Ambulatory Visit (INDEPENDENT_AMBULATORY_CARE_PROVIDER_SITE_OTHER): Payer: BLUE CROSS/BLUE SHIELD | Admitting: Orthopaedic Surgery

## 2016-07-04 ENCOUNTER — Ambulatory Visit (INDEPENDENT_AMBULATORY_CARE_PROVIDER_SITE_OTHER): Payer: Self-pay

## 2016-07-04 ENCOUNTER — Encounter (INDEPENDENT_AMBULATORY_CARE_PROVIDER_SITE_OTHER): Payer: Self-pay | Admitting: Orthopaedic Surgery

## 2016-07-04 VITALS — BP 128/78 | HR 92 | Ht 71.0 in | Wt 220.0 lb

## 2016-07-04 DIAGNOSIS — G894 Chronic pain syndrome: Secondary | ICD-10-CM

## 2016-07-04 DIAGNOSIS — M7542 Impingement syndrome of left shoulder: Secondary | ICD-10-CM

## 2016-07-04 DIAGNOSIS — M7541 Impingement syndrome of right shoulder: Secondary | ICD-10-CM

## 2016-07-04 MED ORDER — METHYLPREDNISOLONE ACETATE 40 MG/ML IJ SUSP
80.0000 mg | INTRAMUSCULAR | Status: AC | PRN
  Administered 2016-07-04: 80 mg

## 2016-07-04 MED ORDER — LIDOCAINE HCL 1 % IJ SOLN
2.0000 mL | INTRAMUSCULAR | Status: AC | PRN
  Administered 2016-07-04: 2 mL

## 2016-07-04 MED ORDER — BUPIVACAINE HCL 0.5 % IJ SOLN
2.0000 mL | INTRAMUSCULAR | Status: AC | PRN
  Administered 2016-07-04: 2 mL via INTRA_ARTICULAR
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
## 2016-07-17 ENCOUNTER — Ambulatory Visit (INDEPENDENT_AMBULATORY_CARE_PROVIDER_SITE_OTHER): Payer: Self-pay | Admitting: Orthopaedic Surgery

## 2017-09-07 ENCOUNTER — Ambulatory Visit (INDEPENDENT_AMBULATORY_CARE_PROVIDER_SITE_OTHER): Payer: BLUE CROSS/BLUE SHIELD | Admitting: Orthopaedic Surgery

## 2017-09-10 ENCOUNTER — Ambulatory Visit (INDEPENDENT_AMBULATORY_CARE_PROVIDER_SITE_OTHER): Payer: BLUE CROSS/BLUE SHIELD | Admitting: Orthopaedic Surgery

## 2017-09-10 ENCOUNTER — Encounter (INDEPENDENT_AMBULATORY_CARE_PROVIDER_SITE_OTHER): Payer: Self-pay

## 2017-09-10 ENCOUNTER — Encounter (INDEPENDENT_AMBULATORY_CARE_PROVIDER_SITE_OTHER): Payer: Self-pay | Admitting: Orthopaedic Surgery

## 2017-09-10 VITALS — BP 119/82 | HR 75 | Resp 12 | Ht 69.0 in | Wt 200.0 lb

## 2017-09-10 DIAGNOSIS — M25512 Pain in left shoulder: Secondary | ICD-10-CM | POA: Diagnosis not present

## 2017-09-10 DIAGNOSIS — M25511 Pain in right shoulder: Secondary | ICD-10-CM | POA: Diagnosis not present

## 2017-09-10 DIAGNOSIS — G8929 Other chronic pain: Secondary | ICD-10-CM

## 2017-09-10 MED ORDER — METHYLPREDNISOLONE ACETATE 40 MG/ML IJ SUSP
80.0000 mg | INTRAMUSCULAR | Status: AC | PRN
  Administered 2017-09-10: 80 mg

## 2017-09-10 MED ORDER — LIDOCAINE HCL 2 % IJ SOLN
2.0000 mL | INTRAMUSCULAR | Status: AC | PRN
  Administered 2017-09-10: 2 mL

## 2017-09-10 MED ORDER — BUPIVACAINE HCL 0.5 % IJ SOLN
2.0000 mL | INTRAMUSCULAR | Status: AC | PRN
  Administered 2017-09-10: 2 mL via INTRA_ARTICULAR

## 2017-09-10 NOTE — Progress Notes (Signed)
Office Visit Note   Patient: James Mccormick           Date of Birth: [birth year]           MRN: [id number] Visit Date: 09/10/2017              Requested by: No referring provider defined for this encounter. PCP: Joette Catching, MD   Assessment & Plan: Visit Diagnoses:  1. Chronic pain of both shoulders     Plan: Recurrent impingement syndrome both shoulders. Will inject subacromial areas and monitor his response. Last injection was November 2017 with excellent result. Again have discussed possible MRI scan future  Follow-Up Instructions: Return if symptoms worsen or fail to improve.   Orders:  No orders of the defined types were placed in this encounter.  No orders of the defined types were placed in this encounter.     Procedures: Large Joint Inj: R subacromial bursa on 09/10/2017 3:13 PM Indications: pain and diagnostic evaluation Details: 25 G 1.5 in needle, anterolateral approach  Arthrogram: No  Medications: 2 mL bupivacaine 0.5 %; 2 mL lidocaine 2 %; 80 mg methylPREDNISolone acetate 40 MG/ML Consent was given by the patient. Immediately prior to procedure a time out was called to verify the correct patient, procedure, equipment, support staff and site/side marked as required. Patient was prepped and draped in the usual sterile fashion.   Large Joint Inj: L subacromial bursa on 09/10/2017 3:14 PM Indications: pain and diagnostic evaluation Details: 25 G 1.5 in needle, anterolateral approach  Arthrogram: No  Medications: 2 mL lidocaine 2 %; 2 mL bupivacaine 0.5 %; 80 mg methylPREDNISolone acetate 40 MG/ML Consent was given by the patient. Immediately prior to procedure a time out was called to verify the correct patient, procedure, equipment, support staff and site/side marked as required. Patient was prepped and draped in the usual sterile fashion.       Clinical Data: No additional findings.   Subjective: Chief Complaint  Patient presents with  .  Right Shoulder - Pain  . Left Shoulder - Pain  Has history of bilateral impingement syndrome of the shoulders. Last evaluated in November 2017 with bilateral subacromial cortisone injections did very well. Having some recurrent discomfort. 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 No injury or trauma. Does have some mild neck discomfort but can distinguish between his neck and shoulder. Presently neck is not a problem  Review of Systems   Objective: Vital Signs: BP 119/82   Pulse 75   Resp 12   Ht 5\' 9"  (1.753 m)   Wt 200 lb (90.7 kg)   BMI 29.53 kg/m   Physical Exam  Ortho Exam awake alert and oriented 3. Comfortable sitting. Mild impingement syndrome both shoulders with full overhead motion. Some pain in the subacromial area bilaterally with abduction. Good grip and release. Neurovascular exam intact. Initially positive empty can test. Good strength. Specialty Comments:  No specialty comments available.  Imaging: No results found.   PMFS History: Patient Active Problem List   Diagnosis Date Noted  . Atrial flutter (HCC) 11/27/2013  . Depression 09/26/2013  . Cigarette smoker 09/26/2013  . Pleural effusion 09/11/2013  . Protein-calorie malnutrition, moderate (HCC) 08/29/2013  . Atrial fibrillation (HCC) 08/29/2013   Past Medical History:  Diagnosis Date  . Abdominal abscess   . Acute encephalopathy   . Acute kidney injury (HCC)   . Acute respiratory failure (HCC)   . Anemia   . Atrial fibrillation (HCC)  a. Documented 08/2013 in setting of severe illness, amiodarone used. Not felt to be anticoag candidate at that time.  . C. difficile colitis   . Cholecystitis   . Depression   . DVT (deep venous thrombosis) (HCC)   . Elevated troponin    a. 08/2013: felt due to demand ischemia in setting of severe illness.  Marland Kitchen. GERD (gastroesophageal reflux disease)   . Hypertension   . LV dysfunction    a. 08/2013: EF 45% setting of severe illness.  .  Perforated, severe stomach ulcer (HCC)   . Protein-calorie malnutrition (HCC)   . Sepsis (HCC)     Family History  Problem Relation Age of Onset  . Mental illness Mother   . Hypertension Sister   . Hypertension Brother     Past Surgical History:  Procedure Laterality Date  . COLON SURGERY    . HERNIA REPAIR    . LAPAROTOMY N/A 08/28/2013   Procedure: EXPLORATORY LAPAROTOMY,debridment of nacrotic stomach,and primary repair of perforated stomach.;  Surgeon: Atilano InaEric M Wilson, MD;  [facility];  Service: General;  Laterality: N/A;  . NECK SURGERY     Social History   Occupational History  . Not on file  Tobacco Use  . Smoking status: Former Smoker    Types: Cigarettes  . Smokeless tobacco: Never Used  . Tobacco comment: Quit 7 weeks before adm in 08/2013  Substance and Sexual Activity  . Alcohol use: No  . Drug use: No  . Sexual activity: Not on file     Valeria BatmanPeter W Raygen Linquist, MD   Note - This record has been created using [employer] software.  Chart creation errors have been sought, but may not always  have been located. Such creation errors do not reflect on  the standard of medical care.

## 2017-09-17 ENCOUNTER — Telehealth (INDEPENDENT_AMBULATORY_CARE_PROVIDER_SITE_OTHER): Payer: Self-pay | Admitting: Orthopaedic Surgery

## 2017-09-17 NOTE — Telephone Encounter (Signed)
Call[facility] pt [facility]

## 2017-09-17 NOTE — Telephone Encounter (Signed)
Tanya from Dr[facility] Hazle Quantatherine [facility]emberg[facility] called requesting office visit notes for 09/10/17.  Please fax to #[phone]

## 2017-12-03 ENCOUNTER — Other Ambulatory Visit: Payer: Self-pay

## 2017-12-03 ENCOUNTER — Observation Stay (HOSPITAL_COMMUNITY)
Admission: EM | Admit: 2017-12-03 | Discharge: 2017-12-04 | Disposition: A | Discharge status: Home / Self Care | Payer: Medicare HMO | Attending: Cardiology | Admitting: Cardiology

## 2017-12-03 ENCOUNTER — Encounter (HOSPITAL_COMMUNITY): Payer: Self-pay | Admitting: Emergency Medicine

## 2017-12-03 ENCOUNTER — Emergency Department (HOSPITAL_COMMUNITY): Payer: Medicare HMO

## 2017-12-03 DIAGNOSIS — Z87891 Personal history of nicotine dependence: Secondary | ICD-10-CM | POA: Insufficient documentation

## 2017-12-03 DIAGNOSIS — K219 Gastro-esophageal reflux disease without esophagitis: Secondary | ICD-10-CM

## 2017-12-03 DIAGNOSIS — N179 Acute kidney failure, unspecified: Secondary | ICD-10-CM

## 2017-12-03 DIAGNOSIS — K255 Chronic or unspecified gastric ulcer with perforation: Secondary | ICD-10-CM

## 2017-12-03 DIAGNOSIS — I480 Paroxysmal atrial fibrillation: Principal | ICD-10-CM | POA: Insufficient documentation

## 2017-12-03 DIAGNOSIS — Z79899 Other long term (current) drug therapy: Secondary | ICD-10-CM | POA: Insufficient documentation

## 2017-12-03 DIAGNOSIS — R000 Tachycardia, unspecified: Secondary | ICD-10-CM | POA: Diagnosis present

## 2017-12-03 DIAGNOSIS — I82409 Acute embolism and thrombosis of unspecified deep veins of unspecified lower extremity: Secondary | ICD-10-CM

## 2017-12-03 DIAGNOSIS — I10 Essential (primary) hypertension: Secondary | ICD-10-CM | POA: Insufficient documentation

## 2017-12-03 DIAGNOSIS — Z86718 Personal history of other venous thrombosis and embolism: Secondary | ICD-10-CM | POA: Insufficient documentation

## 2017-12-03 DIAGNOSIS — I4892 Unspecified atrial flutter: Secondary | ICD-10-CM | POA: Diagnosis not present

## 2017-12-03 DIAGNOSIS — R072 Precordial pain: Secondary | ICD-10-CM

## 2017-12-03 DIAGNOSIS — F32A Depression, unspecified: Secondary | ICD-10-CM | POA: Diagnosis present

## 2017-12-03 DIAGNOSIS — Z7901 Long term (current) use of anticoagulants: Secondary | ICD-10-CM | POA: Diagnosis not present

## 2017-12-03 DIAGNOSIS — F329 Major depressive disorder, single episode, unspecified: Secondary | ICD-10-CM | POA: Insufficient documentation

## 2017-12-03 DIAGNOSIS — R0789 Other chest pain: Secondary | ICD-10-CM | POA: Insufficient documentation

## 2017-12-03 LAB — I-STAT TROPONIN, ED: Troponin i, poc: 0 ng/mL (ref 0.00–0.08)

## 2017-12-03 LAB — CBC WITH DIFFERENTIAL/PLATELET
BASOS ABS: 0 10*3/uL (ref 0.0–0.1)
Basophils Relative: 0 %
EOS PCT: 1 %
Eosinophils Absolute: 0.1 10*3/uL (ref 0.0–0.7)
HCT: 41.3 % (ref 39.0–52.0)
HEMOGLOBIN: 13.7 g/dL (ref 13.0–17.0)
LYMPHS ABS: 1.5 10*3/uL (ref 0.7–4.0)
LYMPHS PCT: 20 %
MCH: 29.8 pg (ref 26.0–34.0)
MCHC: 33.2 g/dL (ref 30.0–36.0)
MCV: 89.8 fL (ref 78.0–100.0)
Monocytes Absolute: 0.5 10*3/uL (ref 0.1–1.0)
Monocytes Relative: 6 %
NEUTROS PCT: 73 %
Neutro Abs: 5.5 10*3/uL (ref 1.7–7.7)
PLATELETS: 162 10*3/uL (ref 150–400)
RBC: 4.6 MIL/uL (ref 4.22–5.81)
RDW: 14.3 % (ref 11.5–15.5)
WBC: 7.5 10*3/uL (ref 4.0–10.5)

## 2017-12-03 LAB — TSH: TSH: 1.163 u[IU]/mL (ref 0.350–4.500)

## 2017-12-03 LAB — HEPARIN LEVEL (UNFRACTIONATED): Heparin Unfractionated: 0.26 IU/mL — ABNORMAL LOW (ref 0.30–0.70)

## 2017-12-03 LAB — COMPREHENSIVE METABOLIC PANEL
ALT: 15 U/L — ABNORMAL LOW (ref 17–63)
ANION GAP: 8 (ref 5–15)
AST: 15 U/L (ref 15–41)
Albumin: 3.5 g/dL (ref 3.5–5.0)
Alkaline Phosphatase: 53 U/L (ref 38–126)
BUN: 23 mg/dL — AB (ref 6–20)
CO2: 22 mmol/L (ref 22–32)
Calcium: 8.3 mg/dL — ABNORMAL LOW (ref 8.9–10.3)
Chloride: 108 mmol/L (ref 101–111)
Creatinine, Ser: 1.31 mg/dL — ABNORMAL HIGH (ref 0.61–1.24)
Glucose, Bld: 111 mg/dL — ABNORMAL HIGH (ref 65–99)
POTASSIUM: 4.3 mmol/L (ref 3.5–5.1)
Sodium: 138 mmol/L (ref 135–145)
TOTAL PROTEIN: 5.8 g/dL — AB (ref 6.5–8.1)
Total Bilirubin: 0.8 mg/dL (ref 0.3–1.2)

## 2017-12-03 LAB — I-STAT CG4 LACTIC ACID, ED: LACTIC ACID, VENOUS: 0.65 mmol/L (ref 0.5–1.9)

## 2017-12-03 MED ORDER — AMIODARONE HCL IN DEXTROSE 360-4.14 MG/200ML-% IV SOLN
30.0000 mg/h | INTRAVENOUS | Status: DC
  Administered 2017-12-03 – 2017-12-04 (×3): 30 mg/h via INTRAVENOUS
  Filled 2017-12-03 (×3): qty 200

## 2017-12-03 MED ORDER — DILTIAZEM HCL-DEXTROSE 100-5 MG/100ML-% IV SOLN (PREMIX)
5.0000 mg/h | INTRAVENOUS | Status: DC
  Administered 2017-12-03 – 2017-12-04 (×3): 12.5 mg/h via INTRAVENOUS
  Filled 2017-12-03 (×2): qty 100

## 2017-12-03 MED ORDER — GABAPENTIN 300 MG PO CAPS
300.0000 mg | ORAL_CAPSULE | Freq: Every morning | ORAL | Status: DC
  Administered 2017-12-04: 300 mg via ORAL
  Filled 2017-12-03: qty 1

## 2017-12-03 MED ORDER — SERTRALINE HCL 100 MG PO TABS
200.0000 mg | ORAL_TABLET | Freq: Every day | ORAL | Status: DC
  Administered 2017-12-03 – 2017-12-04 (×2): 200 mg via ORAL
  Filled 2017-12-03 (×2): qty 2

## 2017-12-03 MED ORDER — HEPARIN BOLUS VIA INFUSION
4000.0000 [IU] | Freq: Once | INTRAVENOUS | Status: AC
  Administered 2017-12-03: 4000 [IU] via INTRAVENOUS
  Filled 2017-12-03: qty 4000

## 2017-12-03 MED ORDER — ASPIRIN EC 81 MG PO TBEC
81.0000 mg | DELAYED_RELEASE_TABLET | Freq: Every day | ORAL | Status: DC
  Administered 2017-12-04: 81 mg via ORAL
  Filled 2017-12-03: qty 1

## 2017-12-03 MED ORDER — AMIODARONE HCL IN DEXTROSE 360-4.14 MG/200ML-% IV SOLN
60.0000 mg/h | INTRAVENOUS | Status: AC
  Administered 2017-12-03 (×2): 60 mg/h via INTRAVENOUS
  Filled 2017-12-03: qty 200

## 2017-12-03 MED ORDER — NITROGLYCERIN 0.4 MG SL SUBL: 0.4000 mg | SUBLINGUAL_TABLET | SUBLINGUAL | Status: DC | PRN

## 2017-12-03 MED ORDER — PANTOPRAZOLE SODIUM 40 MG PO TBEC
40.0000 mg | DELAYED_RELEASE_TABLET | Freq: Every day | ORAL | Status: DC
  Administered 2017-12-04: 40 mg via ORAL
  Filled 2017-12-03: qty 1

## 2017-12-03 MED ORDER — SODIUM CHLORIDE 0.9 % IV BOLUS: 500.0000 mL | Freq: Once | INTRAVENOUS | Status: DC

## 2017-12-03 MED ORDER — ONDANSETRON HCL 4 MG/2ML IJ SOLN: 4.0000 mg | Freq: Four times a day (QID) | INTRAMUSCULAR | Status: DC | PRN

## 2017-12-03 MED ORDER — HEPARIN SODIUM (PORCINE) 5000 UNIT/ML IJ SOLN: 5000.0000 [IU] | Freq: Three times a day (TID) | INTRAMUSCULAR | Status: DC

## 2017-12-03 MED ORDER — ACETAMINOPHEN 325 MG PO TABS
650.0000 mg | ORAL_TABLET | ORAL | Status: DC | PRN
  Administered 2017-12-03: 650 mg via ORAL
  Filled 2017-12-03: qty 2

## 2017-12-03 MED ORDER — HEPARIN (PORCINE) IN NACL 100-0.45 UNIT/ML-% IJ SOLN
1400.0000 [IU]/h | INTRAMUSCULAR | Status: DC
  Administered 2017-12-03: 1200 [IU]/h via INTRAVENOUS
  Administered 2017-12-04: 1400 [IU]/h via INTRAVENOUS
  Filled 2017-12-03 (×2): qty 250

## 2017-12-03 MED ORDER — SODIUM CHLORIDE 0.9 % IV BOLUS
1000.0000 mL | Freq: Once | INTRAVENOUS | Status: AC
  Administered 2017-12-03: 1000 mL via INTRAVENOUS

## 2017-12-03 MED ORDER — DILTIAZEM HCL 25 MG/5ML IV SOLN
10.0000 mg | Freq: Once | INTRAVENOUS | Status: AC
  Administered 2017-12-03: 10 mg via INTRAVENOUS
  Filled 2017-12-03: qty 5

## 2017-12-03 MED ORDER — AMIODARONE LOAD VIA INFUSION
150.0000 mg | Freq: Once | INTRAVENOUS | Status: AC
  Administered 2017-12-03: 150 mg via INTRAVENOUS
  Filled 2017-12-03: qty 83.34

## 2017-12-03 MED ORDER — DILTIAZEM HCL-DEXTROSE 100-5 MG/100ML-% IV SOLN (PREMIX)
5.0000 mg/h | Freq: Once | INTRAVENOUS | Status: AC
  Administered 2017-12-03: 5 mg/h via INTRAVENOUS
  Filled 2017-12-03: qty 100

## 2017-12-03 NOTE — H&P (Addendum)
Cardiology Admission History and Physical:   Patient ID: James Mccormick; MRN: [id number]; DOB: November 04, 1965   Admission date: 12/03/2017  Primary Care Provider: Joette Catching, MD Primary Cardiologist: seen by Dr. Gala Romney and Allred previous admission   Chief Complaint: Heart palpitations and dizziness  Patient Profile:   James Mccormick is a 52 y.o. male with a history of paroxysmal atrial fibrillation, right IJ DVT and perforated stomach ulcer presented to [facility] ER from PCPs office for evaluation of tachycardia.  First episode of atrial fibrillation noted January 2015 when he was admitted for perforated stomach ulcer due to NSAID use. He underwent exploratory laparotomy and repair of ulcer on 08/28/13. Hospitalization was complicated by persistent peritonitis, septic shock requiring pressors, ARDS/VDRF, AKI, acute encephalopathy, protein-calorie malnutrition, anemia of critical illness, abdominal abscess with yeast requiring micafungin and C Diff treated with Flagyl. From cardiac standpoint he developed AF RVR associated with worsening hypotension on 08/29/13 - he underwent attempted DCCV but this was unsuccessful. He was treated with amiodarone with spontaneous conversion to NSR. Afib was felt driven by medical illness and he was not felt to be a candidate for anticoagulation at that time. 2D echo showed EF 40-45%, grade 1 diastolic dysfunction. Troponins neg x 2 then 1.22. This was felt due to demand ischemia.   Again seen for consultation April 2015 for atrial fibrillation.  Spontaneously converted to sinus.  History of Present Illness:   James Mccormick has noted palpitation since April 6.  Constant since then.  He has noted mild chest discomfort since then.  Intermittent dizziness.  No syncope, dyspnea, lower extremity edema, orthopnea, PND or melena.  No change in his routine.  He is Architect.  Due to persistent palpitation he went to see PCP today where noted  tachycardic at rate above 115 and sent to ER for further evaluation.  In ER, EKG noted atrial flutter at rate of 153 bpm.  He is started on IV Cardizem at 5 mg/h.  He drinks 2 coffee every day.  He denies illicit drug use or alcohol abuse.  No tobacco smoking since last hospitalization in 2015. Denies fever, chills, cough, congestion, or headache. No sick contact.   Past Medical History:  Diagnosis Date  . Abdominal abscess   . Acute encephalopathy   . Acute kidney injury (HCC)   . Acute respiratory failure (HCC)   . Anemia   . Atrial fibrillation (HCC)    a. Documented 08/2013 in setting of severe illness, amiodarone used. Not felt to be anticoag candidate at that time.  . C. difficile colitis   . Cholecystitis   . Depression   . DVT (deep venous thrombosis) (HCC)   . Elevated troponin    a. 08/2013: felt due to demand ischemia in setting of severe illness.  Marland Kitchen GERD (gastroesophageal reflux disease)   . Hypertension   . LV dysfunction    a. 08/2013: EF 45% setting of severe illness.  . Perforated, severe stomach ulcer (HCC)   . Protein-calorie malnutrition (HCC)   . Sepsis [facility])    Past Surgical History:  Procedure Laterality Date  . COLON SURGERY    . HERNIA REPAIR    . LAPAROTOMY N/A 08/28/2013   Procedure: EXPLORATORY LAPAROTOMY,debridment of nacrotic stomach,and primary repair of perforated stomach.;  Surgeon: Atilano Ina, MD;  Location: [facility] OR;  Service: General;  Laterality: N/A;  . NECK SURGERY      Medications Prior to Admission: Prior to Admission medications   Medication  Sig Start Date End Date Taking? Authorizing Provider  aspirin 325 MG tablet Take 325 mg by mouth once.   Yes [provider]  cyclobenzaprine (FLEXERIL) 10 MG tablet Take 10 mg by mouth 3 (three) times daily. 07/19/15  Yes [provider]  gabapentin (NEURONTIN) 300 MG capsule Take 300 mg by mouth every morning.  02/01/15  Yes [provider]  HYDROcodone-acetaminophen  (NORCO/VICODIN) 5-325 MG tablet Take 1 tablet by mouth 2 (two) times daily as needed for pain. 10/30/17  Yes [provider]  omeprazole (PRILOSEC) 40 MG capsule Take 40 mg by mouth 2 (two) times daily. 09/11/17  Yes [provider]  sertraline (ZOLOFT) 100 MG tablet Take 200 mg by mouth daily.    Yes [provider]  valsartan (DIOVAN) 80 MG tablet Take 80 mg by mouth daily. 11/30/17  Yes [provider]  oxyCODONE-acetaminophen (PERCOCET) 5-325 MG tablet Take 1-2 tablets by mouth every 4 (four) hours as needed for severe pain. Patient not taking: Reported on 12/03/2017 11/23/15   Pricilla LovelessGoldston, Scott, MD  tadalafil (CIALIS) 20 MG tablet Take 20 mg by mouth daily as needed for erectile dysfunction.  07/08/15 08/07/15  [provider]     Allergies:   No Known Allergies  Social History:   Social History   Socioeconomic History  . Marital status: Divorced    Spouse name: Not on file  . Number of children: Not on file  . Years of education: Not on file  . Highest education level: Not on file  Occupational History  . Not on file  Social Needs  . Financial resource strain: Not on file  . Food insecurity:    Worry: Not on file    Inability: Not on file  . Transportation needs:    Medical: Not on file    Non-medical: Not on file  Tobacco Use  . Smoking status: Former Smoker    Types: Cigarettes  . Smokeless tobacco: Never Used  . Tobacco comment: Quit 7 weeks before adm in 08/2013  Substance and Sexual Activity  . Alcohol use: No  . Drug use: No  . Sexual activity: Not on file  Lifestyle  . Physical activity:    Days per week: Not on file    Minutes per session: Not on file  . Stress: Not on file  Relationships  . Social connections:    Talks on phone: Not on file    Gets together: Not on file    Attends religious service: Not on file    Active member of club or organization: Not on file    Attends meetings of clubs or organizations: Not on  file    Relationship status: Not on file  . Intimate partner violence:    Fear of current or ex partner: Not on file    Emotionally abused: Not on file    Physically abused: Not on file    Forced sexual activity: Not on file  Other Topics Concern  . Not on file  Social History Narrative  . Not on file    Family History:   The patient's family history includes Hypertension in his brother and sister; Mental illness in his mother.    ROS:  Please see the history of present illness.  All other ROS reviewed and negative.     Physical Exam/Data:   Vitals:   12/03/17 1200 12/03/17 1230 12/03/17 1245 12/03/17 1300  BP: 111/89 (!) 114/91 (!) 121/94 (!) 112/96  Pulse: Marland Kitchen(!)  153 (!) 151 (!) 152 (!) 153  Resp: 12 18 17 17   Temp:      TempSrc:      SpO2: 96% 96% 96% 96%  Weight:      Height:        Intake/Output Summary (Last 24 hours) at 12/03/2017 1318 Last data filed at 12/03/2017 1109 Gross per 24 hour  Intake 1000 ml  Output -  Net 1000 ml   Filed Weights   12/03/17 1004  Weight: 225 lb (102.1 kg)   Body mass index is 31.38 kg/m.  General:  Well nourished, well developed, in no acute distress HEENT: normal Lymph: no adenopathy Neck: no JVD Endocrine:  No thryomegaly Vascular: No carotid bruits; FA pulses 2+ bilaterally without bruits  Cardiac:  normal S1, S2; regular tachycardic; no murmur  Lungs:  clear to auscultation bilaterally, no wheezing, rhonchi or rales  Abd: soft, nontender, no hepatomegaly  Ext: no  edema Musculoskeletal:  No deformities, BUE and BLE strength normal and equal Skin: warm and dry  Neuro:  CNs 2-12 intact, no focal abnormalities noted Psych:  Normal affect   EKG:  The ECG that was done  was personally reviewed and demonstrates atrial fibrillation at rate of 154 bpm  Relevant CV Studies: Echocardiogram January 2015 Study Conclusions  - Left ventricle: The cavity size was normal. Wall thickness was increased in a pattern of mild LVH.  Systolic function was mildly to moderately reduced. The estimated ejection fraction was in the range of 40% to 45%. Diffuse hypokinesis. Doppler parameters are consistent with abnormal left ventricular relaxation (grade 1 diastolic dysfunction). - Aortic valve: Poorly visualized. Probably trileaflet; mildly calcified leaflets. There was no stenosis. - Mitral valve: No significant regurgitation. - Right ventricle: The cavity size was normal. Systolic function was mildly reduced. - Pulmonary arteries: No complete TR doppler jet so unable to estimate PA systolic pressure. Impressions:  Laboratory Data:  Chemistry Recent Labs  Lab 12/03/17 1021  NA 138  K 4.3  CL 108  CO2 22  GLUCOSE 111*  BUN 23*  CREATININE 1.31*  CALCIUM 8.3*  GFRNONAA >60  GFRAA >60  ANIONGAP 8    Recent Labs  Lab 12/03/17 1021  PROT 5.8*  ALBUMIN 3.5  AST 15  ALT 15*  ALKPHOS 53  BILITOT 0.8   Hematology Recent Labs  Lab 12/03/17 1021  WBC 7.5  RBC 4.60  HGB 13.7  HCT 41.3  MCV 89.8  MCH 29.8  MCHC 33.2  RDW 14.3  PLT 162   Recent Labs  Lab 12/03/17 1032  TROPIPOC 0.00   Radiology/Studies:  Dg Chest 2 View  Result Date: 12/03/2017 CLINICAL DATA:  Chest pain EXAM: CHEST - 2 VIEW COMPARISON:  10/29/2013 FINDINGS: Heart is normal size. Mild interstitial prominence in the lungs. No confluent opacities or effusions. No acute bony abnormality. IMPRESSION: Mild interstitial prominence may reflect chronic interstitial lung disease. Electronically Signed   By: Charlett Nose M.D.   On: 12/03/2017 11:03    Assessment and Plan:   1. Atrial fibrillation with rapid ventricular rate Prior history of atrial fibrillation paroxysmally during complex admission in January 2015. -This episode started 11/24/17.  He is symptomatic with this.  He will need for TEE cardioversion if not spontaneously cardioverted.  He will be a good candidate for ablation if recurrent episode. -TSH normal.   Electrolytes normal.  Will repeat echocardiogram once stable rate.  Monitor on telemetry. CHADSVASC score of 0.  2.  Chest pressure -Troponin  negative.  EKG without ischemic etiology.  Likely rate related.  Severity of Illness: The appropriate patient status for this patient is OBSERVATION. Observation status is judged to be reasonable and necessary in order to provide the required intensity of service to ensure the patient's safety. The patient's presenting symptoms, physical exam findings, and initial radiographic and laboratory data in the context of their medical condition is felt to place them at decreased risk for further clinical deterioration. Furthermore, it is anticipated that the patient will be medically stable for discharge from the hospital within 2 midnights of admission. The following factors support the patient status of observation.   " The patient's presenting symptoms include  Palpitations, Chest pressure, diziness " The physical exam findings include tachycardia " The initial radiographic and laboratory data are AFlutter RVR  For questions or updates, please contact [facility] Please consult [email or web address] for contact info under Cardiology/STEMI.   Vonzella Nipple [place], [place]  12/03/2017 1:18 PM    The patient was seen, examined and discussed with Bhagat,Bhavinkumar PA-C and I agree with the above.   52 y.o. male with a history of PAF, right IJ DVT and perforated stomach ulcer due to NSAID use in 2015. He underwent exploratory laparotomy and repair of ulcer on 08/28/13. Hospitalization was complicated by persistent peritonitis, septic shock requiring pressors, ARDS/VDRF, AKI, acute encephalopathy, protein-calorie malnutrition, anemia of critical illness, abdominal abscess with yeast requiring micafungin and C Diff treated with Flagyl. From cardiac standpoint he developed AF RVR associated with worsening hypotension on 08/29/13 - he underwent attempted DCCV but this was  unsuccessful. He was treated with amiodarone with spontaneous conversion to NSR. Afib was felt driven by medical illness and he was not felt to be a candidate for anticoagulation at that time. 2D echo showed EF 40-45%, grade 1 diastolic dysfunction. Troponins neg x 2 then 1.22. This was felt due to demand ischemia.  Another episode of atrial fibrillation in 11/2013, that spontaneously converted to sinus.  He presented with chest tightness for the last 10 days associated with palpitations, no DOE, no LE edema, no orthopnea, PND, mild intermittent dizziness but no syncope.   In ER, EKG noted atrial flutter at rate of 153 bpm.  He is started on IV Cardizem at 5 mg/h.  He drinks 2 coffee every day.  He denies illicit drug use or alcohol abuse.  No tobacco smoking since last hospitalization in 2015. Denies fever, chills, cough, congestion, or headache. No sick contact.   Physical exam shows no elevated JVDs, rapid heart rate, no murmur, lungs CTA, no LE edema, good peripheral pulses.  Plan: Start iv Heparin Continue cardizem drip, uptitrate as tolerated by BP Order echocardiogram Plan for a TEE/DCCV (next available on we 4/17 at 11 am) Continue cycling troponin, if abnormal or chest tightness post cardioversion obtain ischemic workup  Tobias Alexander, MD 12/03/2017

## 2017-12-03 NOTE — Progress Notes (Signed)
ANTICOAGULATION CONSULT NOTE - Initial Consult  Pharmacy Consult for heparin Indication: atrial fibrillation  No Known Allergies  Patient Measurements: Height: 5\' 11"  (180.3 cm) Weight: 225 lb (102.1 kg) IBW/kg (Calculated) : 75.3 Heparin Dosing Weight: 96.5 kg  Vital Signs: Temp: 98.4 F (36.9 C) (04/15 1001) Temp Source: Oral (04/15 1001) BP: 119/91 (04/15 1345) Pulse Rate: 153 (04/15 1345)  Labs: Recent Labs    12/03/17 1021  HGB 13.7  HCT 41.3  PLT 162  CREATININE 1.31*    Assessment: New onset AFib - starting heparin gtt. CBC ok, SCr 1.3, eCrCl 70-80 ml/min. Had AFib in the past but was not a candidate for anticoagulation at that time. Also reportedly had prior stomach ulcer, will need to monitor s/sx bleeding.   Goal of Therapy:  Heparin level 0.3-0.7 units/ml Monitor platelets by anticoagulation protocol: Yes    Plan:  -Heparin bolus 4000 units x1 then 1200 units/hr -Daily HL, CBC -Check level in 6 hours   James Mccormick, James Mccormick 12/03/2017,2:42 PM

## 2017-12-03 NOTE — ED Notes (Signed)
Dr. Iver NestleBhagat paged to RN Crystal regarding medication per her request

## 2017-12-03 NOTE — Progress Notes (Signed)
Rate still elevated despite IV cardizem at 12.5. Will continue. ADD IV amiodarone.

## 2017-12-03 NOTE — ED Provider Notes (Signed)
[facility] EMERGENCY DEPARTMENT Provider Note   CSN: [id number] Arrival date & time: 12/03/17  4098     History   Chief Complaint Chief Complaint  Patient presents with  . Tachycardia    HPI James Mccormick is a 52 y.o. male.  HPI   Patient is 52 year old male presenting with 2 weeks of intermittent dizziness.  Patient noted to have himself to have an elevated heart rate.  He went to his primary care physician today.  They found his heart rate to 153 and send him here to the emergency department.  Patient had intermittent dizzy when he goes from sitting to standing.  Otherwise has been relatively asymptomatic.  Patient has no history of cardiac disease other than in the setting of severe systemic illness.  Patient had a ruptured ulcer causing prolonged hospitalization, sepsis etc. aside from this has not had a significant past medical history.  Past Medical History:  Diagnosis Date  . Abdominal abscess   . Acute encephalopathy   . Acute kidney injury (HCC)   . Acute respiratory failure (HCC)   . Anemia   . Atrial fibrillation (HCC)    a. Documented 08/2013 in setting of severe illness, amiodarone used. Not felt to be anticoag candidate at that time.  . C. difficile colitis   . Cholecystitis   . Depression   . DVT (deep venous thrombosis) (HCC)   . Elevated troponin    a. 08/2013: felt due to demand ischemia in setting of severe illness.  Marland Kitchen GERD (gastroesophageal reflux disease)   . Hypertension   . LV dysfunction    a. 08/2013: EF 45% setting of severe illness.  . Perforated, severe stomach ulcer (HCC)   . Protein-calorie malnutrition (HCC)   . Sepsis [facility])     Patient Active Problem List   Diagnosis Date Noted  . Atrial flutter (HCC) 11/27/2013  . Depression 09/26/2013  . Cigarette smoker 09/26/2013  . Pleural effusion 09/11/2013  . Protein-calorie malnutrition, moderate (HCC) 08/29/2013  . Atrial fibrillation (HCC) 08/29/2013    Past  Surgical History:  Procedure Laterality Date  . COLON SURGERY    . HERNIA REPAIR    . LAPAROTOMY N/A 08/28/2013   Procedure: EXPLORATORY LAPAROTOMY,debridment of nacrotic stomach,and primary repair of perforated stomach.;  Surgeon: Atilano Ina, MD;  Location: [facility] OR;  Service: General;  Laterality: N/A;  . NECK SURGERY          Home Medications    Prior to Admission medications   Medication Sig Start Date End Date Taking? Authorizing Provider  clonazePAM (KLONOPIN) 0.5 MG tablet Take 0.5 mg by mouth 2 (two) times daily as needed for anxiety.    [provider]  clonazePAM (KLONOPIN) 0.5 MG tablet Take 0.5 mg by mouth 2 (two) times daily. 06/11/15   [provider]  cyclobenzaprine (FLEXERIL) 10 MG tablet Take 10 mg by mouth 3 (three) times daily. 07/19/15   [provider]  dexlansoprazole (DEXILANT) 60 MG capsule Take by mouth. 05/23/16   [provider]  gabapentin (NEURONTIN) 300 MG capsule Take 300 mg by mouth every morning.  02/01/15   [provider]  oxyCODONE-acetaminophen (PERCOCET) 5-325 MG tablet Take 1-2 tablets by mouth every 4 (four) hours as needed for severe pain. 11/23/15   Pricilla Loveless, MD  pantoprazole (PROTONIX) 40 MG tablet Take 40 mg by mouth daily.    [provider]  senna (SENOKOT) 8.6 MG tablet Take 1 tablet by mouth daily as  needed for constipation.    [provider]  sertraline (ZOLOFT) 100 MG tablet Take 200 mg by mouth daily.     [provider]  tadalafil (CIALIS) 20 MG tablet Take 20 mg by mouth daily as needed for erectile dysfunction.  07/08/15 08/07/15  [provider]  testosterone cypionate (DEPOTESTOSTERONE CYPIONATE) 200 MG/ML injection Inject 200 mg into the muscle every Thursday. 05/25/15   [provider]  valsartan (DIOVAN) 320 MG tablet Take 320 mg by mouth daily. 12/28/14   [provider]    Family History Family History  Problem Relation Age of  Onset  . Mental illness Mother   . Hypertension Sister   . Hypertension Brother     Social History Social History   Tobacco Use  . Smoking status: Former Smoker    Types: Cigarettes  . Smokeless tobacco: Never Used  . Tobacco comment: Quit 7 weeks before adm in 08/2013  Substance Use Topics  . Alcohol use: No  . Drug use: No     Allergies   Patient has no known allergies.   Review of Systems Review of Systems  Constitutional: Positive for fatigue. Negative for activity change and fever.  Respiratory: Positive for chest tightness. Negative for shortness of breath.   Cardiovascular: Negative for chest pain.  Gastrointestinal: Negative for abdominal pain.  Neurological: Positive for light-headedness.  All other systems reviewed and are negative.    Physical Exam Updated Vital Signs BP (!) 121/94 (BP Location: Right Arm)   Pulse (!) 155   Temp 98.4 F (36.9 C) (Oral)   Resp 16   Ht 5\' 11"  (1.803 m)   Wt 102.1 kg (225 lb)   SpO2 97%   BMI 31.38 kg/m   Physical Exam  Constitutional: He is oriented to person, place, and time. He appears well-nourished.  HENT:  Head: Normocephalic.  Eyes: Conjunctivae are normal.  Cardiovascular: Regular rhythm.  tachycardiaa  Pulmonary/Chest: Effort normal and breath sounds normal. No respiratory distress.  Abdominal: Soft. He exhibits no distension. There is no tenderness.  Neurological: He is oriented to person, place, and time.  Skin: Skin is warm and dry. He is not diaphoretic.  Psychiatric: He has a normal mood and affect. His behavior is normal.     ED Treatments / Results  Labs (all labs ordered are listed, but only abnormal results are displayed) Labs Reviewed  COMPREHENSIVE METABOLIC PANEL  CBC WITH DIFFERENTIAL/PLATELET  TSH  I-STAT TROPONIN, ED  I-STAT CG4 LACTIC ACID, ED    EKG EKG Interpretation  Date/Time:  Monday December 03 2017 10:06:23 EDT Ventricular Rate:  154 PR Interval:    QRS  Duration: 150 QT Interval:  355 QTC Calculation: 569 R Axis:   -31 Text Interpretation:  tachycardiat at 150 bpm.  Confirmed by Corlis Leak, Courteney ([id number]) on 12/03/2017 11:31:33 AM   Radiology No results found.  Procedures Procedures (including critical care time)  CRITICAL CARE Performed by: Arlana Hove Total critical care time: 60 minutes Critical care time was exclusive of separately billable procedures and treating other patients. Critical care was necessary to treat or prevent imminent or life-threatening deterioration. Critical care was time spent personally by me on the following activities: development of treatment plan with patient and/or surrogate as well as nursing, discussions with consultants, evaluation of patient's response to treatment, examination of patient, obtaining history from patient or surrogate, ordering and performing treatments and interventions, ordering and review of laboratory studies, ordering and review of radiographic studies, pulse  oximetry and re-evaluation of patient's condition.   Medications Ordered in ED Medications  sodium chloride 0.9 % bolus 1,000 mL (1,000 mLs Intravenous New Bag/Given 12/03/17 1027)  diltiazem (CARDIZEM) injection 10 mg (has no administration in time range)     Initial Impression / Assessment and Plan / ED Course  I have reviewed the triage vital signs and the nursing notes.  Pertinent labs & imaging results that were available during my care of the patient were reviewed by me and considered in my medical decision making (see chart for details).     Patient is 52 year old male presenting with 2 weeks of intermittent dizziness.  Patient noted to have himself to have an elevated heart rate.  He went to his primary care physician today.  They found his heart rate to 153 and send him here to the emergency department.  Patient had intermittent dizzy when he goes from sitting to standing.  Otherwise has been relatively  asymptomatic.  Patient has no history of cardiac disease other than in the setting of severe systemic illness.  Patient had a ruptured ulcer causing prolonged hospitalization, sepsis etc. aside from this has not had a significant past medical history.  10:46 AM Heart rate is 150.  I am concerned about flutter   Will give a dose of dilt to see if it has affect. Will get labs.   11:32 AM No effect from dilt.    Patient has no hemodynamic compromise right now.  No chest pain.  Could consider adenosine, however it so typical for flutter that I will discuss with cardiology first.  But given the fact that he has been in this for likely over a week and half, would not want to cardiovert without discussing with them first.   Patient was on dilt drip.  Discussed with cardiology. Will admit to cards.    Final Clinical Impressions(s) / ED Diagnoses   Final diagnoses:  None    ED Discharge Orders    None       Abelino DerrickMackuen, Courteney Lyn, MD 12/03/17 1548

## 2017-12-03 NOTE — ED Notes (Signed)
Cardiology at bedside.

## 2017-12-03 NOTE — ED Notes (Signed)
MD notified pt HR has not changed. Requested MD change bed order for SDU.

## 2017-12-03 NOTE — Progress Notes (Signed)
ANTICOAGULATION CONSULT NOTE  Pharmacy Consult for heparin Indication: atrial fibrillation  No Known Allergies  Patient Measurements: Height: 5\' 11"  (180.3 cm) Weight: 229 lb 1.6 oz (103.9 kg) IBW/kg (Calculated) : 75.3 Heparin Dosing Weight: 96.5 kg  Vital Signs: Temp: 98 F (36.7 C) (04/15 1936) Temp Source: Oral (04/15 1936) BP: 100/71 (04/15 1936) Pulse Rate: 93 (04/15 1936)  Labs: Recent Labs    12/03/17 1021 12/03/17 2034  HGB 13.7  --   HCT 41.3  --   PLT 162  --   HEPARINUNFRC  --  0.26*  CREATININE 1.31*  --     Assessment: New onset AFib - starting heparin gtt. CBC ok, SCr 1.3, eCrCl 70-80 ml/min. Had AFib in the past but was not a candidate for anticoagulation at that time. Also reportedly had prior stomach ulcer, will need to monitor s/sx bleeding.  Heparin level is below goal at 0.26 however this is a 3 hr level and is too early to interpret. No bleeding noted.  Goal of Therapy:  Heparin level 0.3-0.7 units/ml Monitor platelets by anticoagulation protocol: Yes   Plan:  -Continue heparin drip at 1200 units/hr -F/U daily heparin level and CBC in am -Monitor for s/sx of bleeding   Loura BackJennifer Northbrook, PharmD, BCPS Clinical Pharmacist Clinical phone for 12/03/2017 until 10p is [phone] After 10p, please call Main [facility] at [phone] for assistance 12/03/2017 9:42 PM

## 2017-12-03 NOTE — ED Triage Notes (Signed)
Pt arrives via EMS from PCP with complaints of racing heart rate and chest pain off and on since Saturday. Pt denies any pain now. A&O.

## 2017-12-03 NOTE — ED Notes (Signed)
Provider at bedside

## 2017-12-04 ENCOUNTER — Inpatient Hospital Stay (HOSPITAL_COMMUNITY): Payer: Medicare HMO

## 2017-12-04 DIAGNOSIS — I82409 Acute embolism and thrombosis of unspecified deep veins of unspecified lower extremity: Secondary | ICD-10-CM

## 2017-12-04 DIAGNOSIS — K219 Gastro-esophageal reflux disease without esophagitis: Secondary | ICD-10-CM

## 2017-12-04 DIAGNOSIS — I10 Essential (primary) hypertension: Secondary | ICD-10-CM

## 2017-12-04 DIAGNOSIS — I4891 Unspecified atrial fibrillation: Secondary | ICD-10-CM | POA: Diagnosis not present

## 2017-12-04 DIAGNOSIS — I480 Paroxysmal atrial fibrillation: Secondary | ICD-10-CM | POA: Diagnosis not present

## 2017-12-04 DIAGNOSIS — Z7901 Long term (current) use of anticoagulants: Secondary | ICD-10-CM

## 2017-12-04 DIAGNOSIS — K255 Chronic or unspecified gastric ulcer with perforation: Secondary | ICD-10-CM

## 2017-12-04 DIAGNOSIS — R0789 Other chest pain: Secondary | ICD-10-CM | POA: Diagnosis not present

## 2017-12-04 DIAGNOSIS — N179 Acute kidney failure, unspecified: Secondary | ICD-10-CM

## 2017-12-04 DIAGNOSIS — Z86718 Personal history of other venous thrombosis and embolism: Secondary | ICD-10-CM | POA: Diagnosis not present

## 2017-12-04 DIAGNOSIS — R072 Precordial pain: Secondary | ICD-10-CM | POA: Diagnosis not present

## 2017-12-04 DIAGNOSIS — I4892 Unspecified atrial flutter: Secondary | ICD-10-CM | POA: Diagnosis not present

## 2017-12-04 LAB — CBC
HCT: 40.3 % (ref 39.0–52.0)
Hemoglobin: 13.4 g/dL (ref 13.0–17.0)
MCH: 29.4 pg (ref 26.0–34.0)
MCHC: 33.3 g/dL (ref 30.0–36.0)
MCV: 88.4 fL (ref 78.0–100.0)
Platelets: 150 10*3/uL (ref 150–400)
RBC: 4.56 MIL/uL (ref 4.22–5.81)
RDW: 14.4 % (ref 11.5–15.5)
WBC: 7.9 10*3/uL (ref 4.0–10.5)

## 2017-12-04 LAB — ECHOCARDIOGRAM COMPLETE
HEIGHTINCHES: 71 in
Weight: 3646.4 oz

## 2017-12-04 LAB — HIV ANTIBODY (ROUTINE TESTING W REFLEX): HIV Screen 4th Generation wRfx: NONREACTIVE

## 2017-12-04 LAB — BASIC METABOLIC PANEL
ANION GAP: 10 (ref 5–15)
BUN: 24 mg/dL — ABNORMAL HIGH (ref 6–20)
CALCIUM: 8.3 mg/dL — AB (ref 8.9–10.3)
CHLORIDE: 107 mmol/L (ref 101–111)
CO2: 20 mmol/L — AB (ref 22–32)
Creatinine, Ser: 1.17 mg/dL (ref 0.61–1.24)
GFR calc Af Amer: >60 mL/min (ref >60)
GFR calc non Af Amer: >60 mL/min (ref >60)
GLUCOSE: 123 mg/dL — AB (ref 65–99)
Potassium: 4 mmol/L (ref 3.5–5.1)
Sodium: 137 mmol/L (ref 135–145)

## 2017-12-04 LAB — HEPARIN LEVEL (UNFRACTIONATED): Heparin Unfractionated: 0.22 IU/mL — ABNORMAL LOW (ref 0.30–0.70)

## 2017-12-04 MED ORDER — PERFLUTREN LIPID MICROSPHERE
1.0000 mL | INTRAVENOUS | Status: AC | PRN
  Filled 2017-12-04: qty 10

## 2017-12-04 MED ORDER — APIXABAN 5 MG PO TABS
5.0000 mg | ORAL_TABLET | Freq: Two times a day (BID) | ORAL | Status: DC
  Administered 2017-12-04: 5 mg via ORAL
  Filled 2017-12-04: qty 1

## 2017-12-04 MED ORDER — NITROGLYCERIN 0.4 MG SL SUBL: 0.4000 mg | SUBLINGUAL_TABLET | SUBLINGUAL | 1 refills | Status: AC | PRN

## 2017-12-04 MED ORDER — DILTIAZEM HCL ER COATED BEADS 120 MG PO CP24: 120.0000 mg | ORAL_CAPSULE | Freq: Every day | ORAL | 3 refills | Status: DC

## 2017-12-04 MED ORDER — ASPIRIN 81 MG PO TBEC: 81.0000 mg | DELAYED_RELEASE_TABLET | Freq: Every day | ORAL | Status: DC

## 2017-12-04 MED ORDER — APIXABAN 5 MG PO TABS: 5.0000 mg | ORAL_TABLET | Freq: Two times a day (BID) | ORAL | 3 refills | Status: DC

## 2017-12-04 MED ORDER — DILTIAZEM HCL ER COATED BEADS 120 MG PO CP24
120.0000 mg | ORAL_CAPSULE | Freq: Every day | ORAL | Status: DC
  Administered 2017-12-04: 120 mg via ORAL
  Filled 2017-12-04: qty 1

## 2017-12-04 NOTE — Progress Notes (Signed)
Progress Note  Patient Name: James Mccormick Date of Encounter: 12/04/2017  Primary Cardiologist: No primary care provider on file.   Subjective   He is feeling better today.  Inpatient Medications    Scheduled Meds: . aspirin EC  81 mg Oral Daily  . gabapentin  300 mg Oral q morning - 10a  . pantoprazole  40 mg Oral Daily  . sertraline  200 mg Oral Daily   Continuous Infusions: . amiodarone 30 mg/hr (12/04/17 0742)  . diltiazem (CARDIZEM) infusion 5 mg/hr (12/04/17 0900)  . heparin 1,400 Units/hr (12/04/17 0742)   PRN Meds: acetaminophen, nitroGLYCERIN, ondansetron (ZOFRAN) IV   Vital Signs    Vitals:   12/03/17 1857 12/03/17 1936 12/04/17 0535 12/04/17 0602  BP: 107/77 100/71 103/74   Pulse: 97 93 78   Resp:  16 18   Temp: 98 F (36.7 C) 98 F (36.7 C) 98 F (36.7 C)   TempSrc: Oral Oral Oral   SpO2: 98% 95% 92%   Weight: 229 lb 1.6 oz (103.9 kg)   227 lb 14.4 oz (103.4 kg)  Height: 5\' 11"  (1.803 m)       Intake/Output Summary (Last 24 hours) at 12/04/2017 1100 Last data filed at 12/04/2017 0927 Gross per 24 hour  Intake 1845.92 ml  Output 0 ml  Net 1845.92 ml   Filed Weights   12/03/17 1004 12/03/17 1857 12/04/17 0602  Weight: 225 lb (102.1 kg) 229 lb 1.6 oz (103.9 kg) 227 lb 14.4 oz (103.4 kg)    Telemetry    A-flutter with RVR -->SR at 8 pm  - Personally Reviewed  ECG    SR - Personally Reviewed  Physical Exam   GEN: No acute distress.   Neck: No JVD Cardiac: RRR, no murmurs, rubs, or gallops.  Respiratory: Clear to auscultation bilaterally. GI: Soft, nontender, non-distended  MS: No edema; No deformity. Neuro:  Nonfocal  Psych: Normal affect   Labs    Chemistry Recent Labs  Lab 12/03/17 1021 12/04/17 0554  NA 138 137  K 4.3 4.0  CL 108 107  CO2 22 20*  GLUCOSE 111* 123*  BUN 23* 24*  CREATININE 1.31* 1.17  CALCIUM 8.3* 8.3*  PROT 5.8*  --   ALBUMIN 3.5  --   AST 15  --   ALT 15*  --   ALKPHOS 53  --   BILITOT 0.8   --   GFRNONAA >60 >60  GFRAA >60 >60  ANIONGAP 8 10     Hematology Recent Labs  Lab 12/03/17 1021 12/04/17 0554  WBC 7.5 7.9  RBC 4.60 4.56  HGB 13.7 13.4  HCT 41.3 40.3  MCV 89.8 88.4  MCH 29.8 29.4  MCHC 33.2 33.3  RDW 14.3 14.4  PLT 162 150    Cardiac EnzymesNo results for input(s): TROPONINI in the last 168 hours.  Recent Labs  Lab 12/03/17 1032  TROPIPOC 0.00     BNPNo results for input(s): BNP, PROBNP in the last 168 hours.   DDimer No results for input(s): DDIMER in the last 168 hours.   Radiology    Dg Chest 2 View  Result Date: 12/03/2017 CLINICAL DATA:  Chest pain EXAM: CHEST - 2 VIEW COMPARISON:  10/29/2013 FINDINGS: Heart is normal size. Mild interstitial prominence in the lungs. No confluent opacities or effusions. No acute bony abnormality. IMPRESSION: Mild interstitial prominence may reflect chronic interstitial lung disease. Electronically Signed   By: Charlett Nose M.D.   On: 12/03/2017 11:03  Cardiac Studies   pending  Patient Profile     52 y.o. male   Assessment & Plan    1. Atrial flutter with RVR 2. Chest pain  The patient cardioverted spontaneously into SR the lats night, we will D/C amiodarone and cardizem drip, start cardizem CD 120 mg po daily. D/C heparin drip, start Eliquis 5 mg po BID, discharge home today, we will arrange for an outpatient follow up.  For questions or updates, please contact [facility] Please consult [email or web address] for contact info under Cardiology/STEMI.      Signed, Tobias AlexanderKatarina Henlee Donovan, MD  12/04/2017, 11:00 AM

## 2017-12-04 NOTE — Discharge Summary (Addendum)
Discharge Summary    Patient ID: James Mccormick,  MRN: [id number], DOB/AGE: 54/11/1965 54 y.o.  Admit date: 12/03/2017 Discharge date: 12/04/2017  Primary Care Provider: Joette CatchingNyland, Leonard Primary Cardiologist: Dr. Tobias AlexanderKatarina Nelson  Discharge Diagnoses    Principal Problem:   Atrial flutter with rapid ventricular response [facility](HCC) Active Problems:   Depression   DVT (deep venous thrombosis) (HCC)   Hypertension   Perforated chronic stomach ulcer (HCC)   GERD (gastroesophageal reflux disease)   AKI (acute kidney injury) (HCC)   Allergies No Known Allergies  Diagnostic Studies/Procedures    Echocardiogram 12/04/17:   Study Conclusions  - Left ventricle: The cavity size was normal. There was moderate   asymmetric hypertrophy. Systolic function was normal. The   estimated ejection fraction was in the range of 60% to 65%. Wall   motion was normal; there were no regional wall motion   abnormalities. Left ventricular diastolic function parameters   were normal. - Left atrium: The atrium was moderately dilated. - Right ventricle: The cavity size was mildly dilated. Wall   thickness was normal. - Right atrium: The atrium was mildly dilated.   History of Present Illness     [name] is a 52 y.o. male with a history of PAF, right IJ DVT, HTN, depression and perforated stomach ulcer secondary to NSAID use in 2015. He underwent exploratory laparotomy and repair of ulcer on 08/28/13. His hospitalization was complicated by persistent peritonitis, septic shock requiring pressors, ARDS/VDRF, AKI, acute encephalopathy, protein-calorie malnutrition, anemia of critical illness, abdominal abscess with yeast requiring micafungin and C Diff treated with Flagyl. From cardiac standpoint he developed AF RVR associated with worsening hypotension on 08/29/13 an underwent an attempted DCCV but this was unsuccessful. He was treated with amiodarone with spontaneous conversion to NSR. His Afib was felt  driven by medical illness and he was not felt to be a candidate for anticoagulation at that time. A 2D echo showed EF 40-45%, grade 1 diastolic dysfunction. Troponins were neg x 2 then 1.22. This was felt due to demand ischemia. He had another episode of atrial fibrillation in 11/2013 which also spontaneously converted to NSR.   He presented to [facility] on 12/03/17 with chest tightness for 10 days associated with palpitations. He denied DOE, LE edema, orthopnea, or PND however, reported mild, intermittent dizziness and no syncope.   In the ER, an EKG noted atrial flutter at rate of 153 bpm.  He was started on IV Cardizem at 5 mg/h. A TSH was WNL. He stated that he drinks 2 coffee drinks every day. He denied illicit drug use or alcohol abuse.  No tobacco smoking since last hospitalization in 2015. Denied fever, chills, cough, congestion, or headache. No sick contacts.   Hospital Course     He was admitted to Cardiology service for further evaluation on 12/03/17. He was started on a Hep gtt for anticoagulation and his Diltiazem was continued from the ED. The initial plan was to continue to cycle troponin levels given his chest pain presentation and to schedule for a DCCV if he remained in AF. Fortunately, the pt converted to NSR on 12/04/17. He was transitioned to PO Dilitazem 120mg  PO QD and was started on Eliquis 5mg  PO BID for anticoagulation secondary to recurrent AF. On the day of discharge, he is stable without complaints. HR remains in NSR at a rate of 78. BP stable at 103/74.   1. Atrial fibrillation/Atrial flutter with rapid ventricular rate: -Prior history of atrial  fibrillation paroxysmally during complex admission in January 2015, initally not started on anticoagulation -Spontaneously converted to NSR on 12/04/17 -TSH and electrolytes normal -Echocardiogram 12/04/17 with normal LVEF 60-65% with no WMA -Started on Diltiazem 120mg  PO QD and Eliquis 5mg  PO BID -Continue ASA 81mg    2.  Chest  pressure: -Chest pressure on day of admission, likely in the setting of elevated HR given that it has now resolved with the stabilization of his HR  -Troponin level negative -EKG without ischemic etiology  -Echocardiogram with normal LVEF and no WMA  Consultants: None   The patient has been seen and examined by Dr. Delton See who feels that he [place] stable and ready for discharge today, 12/04/17. _____________  Discharge Vitals Blood pressure 110/78, pulse 80, temperature 98.3 F (36.8 C), temperature source Oral, resp. rate 18, height 5\' 11"  (1.803 m), weight 227 lb 14.4 oz (103.4 kg), SpO2 94 %.  Filed Weights   12/03/17 1004 12/03/17 1857 12/04/17 0602  Weight: 225 lb (102.1 kg) 229 lb 1.6 oz (103.9 kg) 227 lb 14.4 oz (103.4 kg)   Labs & Radiologic Studies    CBC Recent Labs    12/03/17 1021 12/04/17 0554  WBC 7.5 7.9  NEUTROABS 5.5  --   HGB 13.7 13.4  HCT 41.3 40.3  MCV 89.8 88.4  PLT 162 150   Basic Metabolic Panel Recent Labs    13/08/65 1021 12/04/17 0554  NA 138 137  K 4.3 4.0  CL 108 107  CO2 22 20*  GLUCOSE 111* 123*  BUN 23* 24*  CREATININE 1.31* 1.17  CALCIUM 8.3* 8.3*   Liver Function Tests Recent Labs    12/03/17 1021  AST 15  ALT 15*  ALKPHOS 53  BILITOT 0.8  PROT 5.8*  ALBUMIN 3.5    Thyroid Function Tests Recent Labs    12/03/17 1021  TSH 1.163   _____________  Dg Chest 2 View  Result Date: 12/03/2017 CLINICAL DATA:  Chest pain EXAM: CHEST - 2 VIEW COMPARISON:  10/29/2013 FINDINGS: Heart is normal size. Mild interstitial prominence in the lungs. No confluent opacities or effusions. No acute bony abnormality. IMPRESSION: Mild interstitial prominence may reflect chronic interstitial lung disease. Electronically Signed   By: Charlett Nose M.D.   On: 12/03/2017 11:03   Disposition   Pt is being discharged home today in good condition.  Follow-up Plans & Appointments    Follow-up Information    Dyann Kief, PA-C Follow up on  12/31/2017.   Specialty:  Cardiology Why:  Your follow up appointement will be on 12/31/17 at 0800am with Jacolyn Reedy, PA with Dr. Delton See. Please arrive to your appointment by 0745am.  Contact information: [address] [place] [address] [phone]          Discharge Instructions    Call MD for:  difficulty breathing, headache or visual disturbances   Complete by:  As directed    Call MD for:  severe uncontrolled pain   Complete by:  As directed    Diet - low sodium heart healthy   Complete by:  As directed    Discharge instructions   Complete by:  As directed    If you need to take your Cialis and feel that you need to take your nitroglycerin for chest pain, please make sure that it has been at least 49 hours after the Cialis before you take your nitroglycerin. If you are having chest pain and it is within the 48  hour time frame, please report to the ED for further evaluation. These medications if taken together can actually worsen chest pain or can cause your blood pressure to go too low.  Because you were started on an anticoagulation medication called Eliquis, if you notice any bleeding such as blood in stool, black tarry stools, blood in urine, nosebleeds or any other unusual bleeding, call your doctor immediately.   Increase activity slowly   Complete by:  As directed      Discharge Medications   Allergies as of 12/04/2017   No Known Allergies     Medication List    STOP taking these medications   aspirin 325 MG tablet Replaced by:  aspirin 81 MG EC tablet     TAKE these medications   apixaban 5 MG Tabs tablet Commonly known as:  ELIQUIS Take 1 tablet (5 mg total) by mouth 2 (two) times daily.   aspirin 81 MG EC tablet Take 1 tablet (81 mg total) by mouth daily. Start taking on:  12/05/2017 Replaces:  aspirin 325 MG tablet   CIALIS 20 MG tablet Generic drug:  tadalafil Take 20 mg by mouth daily as needed for erectile dysfunction.     cyclobenzaprine 10 MG tablet Commonly known as:  FLEXERIL Take 10 mg by mouth 3 (three) times daily.   diltiazem 120 MG 24 hr capsule Commonly known as:  CARDIZEM CD Take 1 capsule (120 mg total) by mouth daily. Start taking on:  12/05/2017   gabapentin 300 MG capsule Commonly known as:  NEURONTIN Take 300 mg by mouth every morning.   HYDROcodone-acetaminophen 5-325 MG tablet Commonly known as:  NORCO/VICODIN Take 1 tablet by mouth 2 (two) times daily as needed for pain.   nitroGLYCERIN 0.4 MG SL tablet Commonly known as:  NITROSTAT Place 1 tablet (0.4 mg total) under the tongue every 5 (five) minutes x 3 doses as needed for chest pain.   omeprazole 40 MG capsule Commonly known as:  PRILOSEC Take 40 mg by mouth 2 (two) times daily.   oxyCODONE-acetaminophen 5-325 MG tablet Commonly known as:  PERCOCET Take 1-2 tablets by mouth every 4 (four) hours as needed for severe pain.   sertraline 100 MG tablet Commonly known as:  ZOLOFT Take 200 mg by mouth daily.   valsartan 80 MG tablet Commonly known as:  DIOVAN Take 80 mg by mouth daily.       Outstanding Labs/Studies   None  Duration of Discharge Encounter   Greater than 30 minutes including physician time.  SignedGeorgie Chard NP 12/04/2017, 1:15 PM

## 2017-12-04 NOTE — Progress Notes (Signed)
  Echocardiogram 2D Echocardiogram has been performed.  James Mccormick F 12/04/2017, 11:50 AM

## 2017-12-04 NOTE — Progress Notes (Signed)
ANTICOAGULATION CONSULT NOTE - Follow Up Consult  Pharmacy Consult for Heparin  Indication: atrial fibrillation  No Known Allergies  Patient Measurements: Height: 5\' 11"  (180.3 cm) Weight: 227 lb 14.4 oz (103.4 kg) IBW/kg (Calculated) : 75.3  Vital Signs: Temp: 98 F (36.7 C) (04/16 0535) Temp Source: Oral (04/16 0535) BP: 103/74 (04/16 0535) Pulse Rate: 78 (04/16 0535)  Labs: Recent Labs    12/03/17 1021 12/03/17 2034 12/04/17 0554  HGB 13.7  --  13.4  HCT 41.3  --  40.3  PLT 162  --  150  HEPARINUNFRC  --  0.26* 0.22*  CREATININE 1.31*  --   --     Estimated Creatinine Clearance: 79.8 mL/min (A) (by C-G formula based on SCr of 1.31 mg/dL (H)).  Assessment: 52 y/o M on heparin for afib, ?not a anti-coag candidate in the past, Hgb is good, heparin level is low  Goal of Therapy:  Heparin level 0.3-0.7 units/ml Monitor platelets by anticoagulation protocol: Yes   Plan:  Inc heparin to 1400 units/hr 1500 HL  Abran DukeLedford, Toccara Alford 12/04/2017,6:52 AM

## 2017-12-04 NOTE — Discharge Instructions (Signed)
Atrial Fibrillation °Atrial fibrillation is a type of heartbeat that is irregular or fast (rapid). If you have this condition, your heart keeps quivering in a weird (chaotic) way. This condition can make it so your heart cannot pump blood normally. Having this condition gives a person more risk for stroke, heart failure, and other heart problems. There are different types of atrial fibrillation. Talk with your doctor to learn about the type that you have. °Follow these instructions at home: °· Take over-the-counter and prescription medicines only as told by your doctor. °· If your doctor prescribed a blood-thinning medicine, take it exactly as told. Taking too much of it can cause bleeding. If you do not take enough of it, you will not have the protection that you need against stroke and other problems. °· Do not use any tobacco products. These include cigarettes, chewing tobacco, and e-cigarettes. If you need help quitting, ask your doctor. °· If you have apnea (obstructive sleep apnea), manage it as told by your doctor. °· Do not drink alcohol. °· Do not drink beverages that have caffeine. These include coffee, soda, and tea. °· Maintain a healthy weight. Do not use diet pills unless your doctor says they are safe for you. Diet pills may make heart problems worse. °· Follow diet instructions as told by your doctor. °· Exercise regularly as told by your doctor. °· Keep all follow-up visits as told by your doctor. This is important. °Contact a doctor if: °· You notice a change in the speed, rhythm, or strength of your heartbeat. °· You are taking a blood-thinning medicine and you notice more bruising. °· You get tired more easily when you move or exercise. °Get help right away if: °· You have pain in your chest or your belly (abdomen). °· You have sweating or weakness. °· You feel sick to your stomach (nauseous). °· You notice blood in your throw up (vomit), poop (stool), or pee (urine). °· You are short of  breath. °· You suddenly have swollen feet and ankles. °· You feel dizzy. °· Your suddenly get weak or numb in your face, arms, or legs, especially if it happens on one side of your body. °· You have trouble talking, trouble understanding, or both. °· Your face or your eyelid droops on one side. °These symptoms may be an emergency. Do not wait to see if the symptoms will go away. Get medical help right away. Call your local emergency services (911 in the [place] Do not drive yourself to the hospital. °This information is not intended to replace advice given to you by your health care provider. Make sure you discuss any questions you have with your health care provider. °Document Released: 05/16/2008 Document Revised: 01/13/2016 Document Reviewed: 12/02/2014 °Elsevier Interactive Patient Education © 2018 [employer] °Information on my medicine - ELIQUIS® (apixaban) ° °This medication education was reviewed with me or my healthcare representative as part of my discharge preparation.   ° °Why was Eliquis® prescribed for you? °Eliquis® was prescribed for you to reduce the risk of a blood clot forming that can cause a stroke if you have a medical condition called atrial fibrillation (a type of irregular heartbeat). ° °What do You need to know about Eliquis® ? °Take your Eliquis® TWICE DAILY - one tablet in the morning and one tablet in the evening with or without food. If you have difficulty swallowing the tablet whole please discuss with your pharmacist how to take the medication safely. ° °Take Eliquis® exactly as   prescribed by your doctor and DO NOT stop taking Eliquis® without talking to the doctor who prescribed the medication.  Stopping may increase your risk of developing a stroke.  Refill your prescription before you run out. ° °After discharge, you should have regular check-up appointments with your healthcare provider that is prescribing your Eliquis®.  In the future your dose may need to be changed if your  kidney function or weight changes by a significant amount or as you get older. ° °What do you do if you miss a dose? °If you miss a dose, take it as soon as you remember on the same day and resume taking twice daily.  Do not take more than one dose of ELIQUIS at the same time to make up a missed dose. ° °Important Safety Information °A possible side effect of Eliquis® is bleeding. You should call your healthcare provider right away if you experience any of the following: °? Bleeding from an injury or your nose that does not stop. °? Unusual colored urine (red or dark brown) or unusual colored stools (red or black). °? Unusual bruising for unknown reasons. °? A serious fall or if you hit your head (even if there is no bleeding). ° °Some medicines may interact with Eliquis® and might increase your risk of bleeding or clotting while on Eliquis®. To help avoid this, consult your healthcare provider or pharmacist prior to using any new prescription or non-prescription medications, including herbals, vitamins, non-steroidal anti-inflammatory drugs (NSAIDs) and supplements. ° °This website has more information on Eliquis® (apixaban): http://www.eliquis.com/eliquis/home ° °

## 2017-12-04 NOTE — Care Management CC44 (Signed)
Condition Code 44 Documentation Completed  Patient Details  Name: James Mccormick MRN: [id number] Date of Birth: 09/07/1965   Condition Code 44 given:  Yes Patient signature on Condition Code 44 notice:  Yes Documentation of 2 MD's agreement:  Yes Code 44 added to claim:  Yes    Gala LewandowskyGraves-Bigelow, Shanikqua Zarzycki Kaye, RN 12/04/2017, 1:51 PM

## 2017-12-04 NOTE — Care Management Obs Status (Signed)
MEDICARE OBSERVATION STATUS NOTIFICATION   Patient Details  Name: James Mccormick MRN: [id number] Date of Birth: 09/18/1965   Medicare Observation Status Notification Given:  Yes    Gala LewandowskyGraves-Bigelow, Jessicia Napolitano Kaye, RN 12/04/2017, 1:51 PM

## 2017-12-05 SURGERY — ECHOCARDIOGRAM, TRANSESOPHAGEAL
Anesthesia: Monitor Anesthesia Care

## 2017-12-13 ENCOUNTER — Encounter: Payer: Self-pay | Admitting: Physician Assistant

## 2017-12-26 ENCOUNTER — Telehealth: Payer: Self-pay | Admitting: Cardiology

## 2017-12-26 NOTE — Telephone Encounter (Signed)
New message  Pt verbalzied that he is calling for RN  Pt is wanting a call back today he is very jittery  Pt verbalized that its a like a nervious feeling please call pt he thinks it may   be one of the medications that he was placed on while in the hospital

## 2017-12-26 NOTE — Telephone Encounter (Signed)
Spoke with patient re: his complaint of feeling jittery. He reports waking up and feeling cold with shivers. His BP is 111/89 HR 89. I asked patient to take his temp and it was 99.4. Patient denies any cardiac symptoms... No chest pain, dizziness, or shortness of breath. He says it started this morning when he woke up before taking his cardiac meds. Advised patient to speak with Dr. Joyce Copa office to hopefully get an appointment since he could have possibly picked up an infection after being in the hospital. Patient to keep his post hospital follow up with Herma Carson PA in our office Monday 12/31/17. To call back if anything changes or worsens. Patient verbalized understanding.

## 2017-12-27 NOTE — Progress Notes (Signed)
Cardiology Office Note    Date:  12/31/2017   ID:  James Mccormick, DOB 09-Dec-1965, MRN [id number]  PCP:  Rebecka Apley, NP  Cardiologist: Tobias Alexander, MD  Chief Complaint  Patient presents with  . Follow-up    History of Present Illness:  James Mccormick is a 52 y.o. male with history of atrial fibrillation in the setting of acute illness with peritonitis septic shock & ARDS om 2015.  He underwent unsuccessful cardioversion and was treated with amiodarone and spontaneously converted to normal sinus rhythm.  He was not felt to be a candidate for anticoagulation at that time.  2D echo at that time LVEF 40 to 45% with grade 1 DD.  Patient was admitted to the hospital with recurrent atrial flutter and converted to normal sinus rhythm spontaneously.  He was started on Eliquis 5 mg twice daily and continued on diltiazem.  Chest pain was felt secondary to rapid heart rates.  Troponins negative.  2D echo 12/04/17 normal LVEF 60 to 65% with no wall motion abnormality.  Patient comes in today accompanied by his daughter.  Last week he had a feeling of chills and jittery feeling that was different from his atrial fibrillation.  He had a low-grade temperature and was seen by his PCP.  They felt he was in normal rhythm but did not do an EKG.  Blood work with stable creatinine 1.32 which was up a little from discharge at 1.17.  Was felt he had a virus.  No further chest pain, palpitations, dyspnea, dyspnea on exertion, dizziness or presyncope.  In the hospital his CHA2DS2-VASc was listed at 0 but I calculated it at 3 because of history of hypertension and prior DVT in his neck.  He is back to work as a Curator and complains of easy bruising.  Past Medical History:  Diagnosis Date  . Abdominal abscess   . Acute encephalopathy   . Acute kidney injury (HCC)   . Acute respiratory failure (HCC)   . Anemia   . Atrial fibrillation (HCC)    a. Documented 08/2013 in setting of severe illness,  amiodarone used. Not felt to be anticoag candidate at that time.  . C. difficile colitis   . Cholecystitis   . Depression   . DVT (deep venous thrombosis) (HCC)   . Elevated troponin    a. 08/2013: felt due to demand ischemia in setting of severe illness.  Marland Kitchen GERD (gastroesophageal reflux disease)   . Hypertension   . LV dysfunction    a. 08/2013: EF 45% setting of severe illness.  . Perforated, severe stomach ulcer (HCC)   . Protein-calorie malnutrition (HCC)   . Sepsis [facility])     Past Surgical History:  Procedure Laterality Date  . COLON SURGERY    . HERNIA REPAIR    . LAPAROTOMY N/A 08/28/2013   Procedure: EXPLORATORY LAPAROTOMY,debridment of nacrotic stomach,and primary repair of perforated stomach.;  Surgeon: Atilano Ina, MD;  Location: [facility] OR;  Service: General;  Laterality: N/A;  . NECK SURGERY      Current Medications: Current Meds  Medication Sig  . apixaban (ELIQUIS) 5 MG TABS tablet Take 1 tablet (5 mg total) by mouth 2 (two) times daily.  Marland Kitchen aspirin EC 81 MG EC tablet Take 1 tablet (81 mg total) by mouth daily.  . cyclobenzaprine (FLEXERIL) 10 MG tablet Take 10 mg by mouth 3 (three) times daily.  Marland Kitchen diltiazem (CARDIZEM CD) 120 MG 24 hr capsule Take 1 capsule (  120 mg total) by mouth daily.  Marland Kitchen gabapentin (NEURONTIN) 300 MG capsule Take 300 mg by mouth every morning.   Marland Kitchen HYDROcodone-acetaminophen (NORCO/VICODIN) 5-325 MG tablet Take 1 tablet by mouth 2 (two) times daily as needed for pain.  . nitroGLYCERIN (NITROSTAT) 0.4 MG SL tablet Place 1 tablet (0.4 mg total) under the tongue every 5 (five) minutes x 3 doses as needed for chest pain.  Marland Kitchen omeprazole (PRILOSEC) 40 MG capsule Take 40 mg by mouth 2 (two) times daily.  Marland Kitchen oxyCODONE-acetaminophen (PERCOCET) 5-325 MG tablet Take 1-2 tablets by mouth every 4 (four) hours as needed for severe pain.  Marland Kitchen sertraline (ZOLOFT) 100 MG tablet Take 200 mg by mouth daily.   . valsartan (DIOVAN) 80 MG tablet Take 80 mg by mouth daily.      Allergies:   Patient has no known allergies.   Social History   Socioeconomic History  . Marital status: Divorced    Spouse name: Not on file  . Number of children: Not on file  . Years of education: Not on file  . Highest education level: Not on file  Occupational History  . Not on file  Social Needs  . Financial resource strain: Not on file  . Food insecurity:    Worry: Not on file    Inability: Not on file  . Transportation needs:    Medical: Not on file    Non-medical: Not on file  Tobacco Use  . Smoking status: Former Smoker    Types: Cigarettes  . Smokeless tobacco: Never Used  . Tobacco comment: Quit 7 weeks before adm in 08/2013  Substance and Sexual Activity  . Alcohol use: No  . Drug use: No  . Sexual activity: Not on file  Lifestyle  . Physical activity:    Days per week: Not on file    Minutes per session: Not on file  . Stress: Not on file  Relationships  . Social connections:    Talks on phone: Not on file    Gets together: Not on file    Attends religious service: Not on file    Active member of club or organization: Not on file    Attends meetings of clubs or organizations: Not on file    Relationship status: Not on file  Other Topics Concern  . Not on file  Social History Narrative  . Not on file     Family History:  The patient's family history includes Hypertension in his brother and sister; Mental illness in his mother.   ROS:   Please see the history of present illness.    Review of Systems  Constitution: Negative.       Fever and chills resolved last week.  HENT: Negative.   Cardiovascular: Negative.   Respiratory: Negative.   Endocrine: Negative.   Hematologic/Lymphatic: Bruises/bleeds easily.  Musculoskeletal: Negative.   Gastrointestinal: Negative.   Genitourinary: Negative.   Neurological: Negative.    All other systems reviewed and are negative.   PHYSICAL EXAM:   VS:  BP 110/78   Pulse 72   Ht  (1.803 m)   Wt  225 lb 6.4 oz (102.2 kg)   BMI 31.44 kg/m   Physical Exam  GEN: Well nourished, well developed, in no acute distress  Neck: no JVD, carotid bruits, or masses Cardiac:RRR; no murmurs, rubs, or gallops  Respiratory:  clear to auscultation bilaterally, normal work of breathing GI: soft, nontender, nondistended, + BS Ext: without cyanosis, clubbing, or edema,  Good distal pulses bilaterally Neuro:  Alert and Oriented x 3 Psych: euthymic mood, full affect  Wt Readings from Last 3 Encounters:  12/31/17 225 lb 6.4 oz (102.2 kg)  12/04/17 227 lb 14.4 oz (103.4 kg)  09/10/17 200 lb (90.7 kg)      Studies/Labs Reviewed:   EKG:  EKG is  ordered today.  The ekg ordered today demonstrates normal sinus rhythm normal EKG  Recent Labs: 12/03/2017: ALT 15; TSH 1.163 12/04/2017: BUN 24; Creatinine, Ser 1.17; Hemoglobin 13.4; Platelets 150; Potassium 4.0; Sodium 137   Lipid Panel    Component Value Date/Time   TRIG 155 (H) 10/13/2013 0612    Additional studies/ records that were reviewed today include:  2D echo 12/04/2017   Study Conclusions   - Left ventricle: The cavity size was normal. There was moderate   asymmetric hypertrophy. Systolic function was normal. The   estimated ejection fraction was in the range of 60% to 65%. Wall   motion was normal; there were no regional wall motion   abnormalities. Left ventricular diastolic function parameters   were normal. - Left atrium: The atrium was moderately dilated. - Right ventricle: The cavity size was mildly dilated. Wall   thickness was normal. - Right atrium: The atrium was mildly dilated.       ASSESSMENT:    1. Atrial flutter, unspecified type (HCC)   2. Paroxysmal atrial fibrillation (HCC)   3. Essential hypertension      PLAN:  In order of problems listed above:  Atrial flutter with RVR spontaneously converted to normal sinus rhythm in the hospital on IV amiodarone and diltiazem.  Continue diltiazem and Eliquis.   CHA2DS2-VASc equals 3 for hypertension and prior history of DVT.  Follow-up with Dr. Delton See in 2 months.  PAF in 2015 felt secondary to acute illness  Essential hypertension controlled with Diovan   Medication Adjustments/Labs and Tests Ordered: Current medicines are reviewed at length with the patient today.  Concerns regarding medicines are outlined above.  Medication changes, Labs and Tests ordered today are listed in the Patient Instructions below. Patient Instructions  Medication Instructions:  Your physician recommends that you continue on your current medications as directed. Please refer to the Current Medication list given to you today.  Labwork: NONE  Testing/Procedures: NONE  Follow-Up: Your physician wants you to follow-up in: 2 to 3 months with Dr. Delton See.   If you need a refill on your cardiac medications before your next appointment, please call your pharmacy.       Elson Clan, PA-C  12/31/2017 8:23 AM    [facility] [address] [place], [place], [place]  [address] Phone: [phone]; Fax: [phone]

## 2017-12-31 ENCOUNTER — Encounter: Payer: Self-pay | Admitting: Physician Assistant

## 2017-12-31 ENCOUNTER — Ambulatory Visit: Payer: Medicare HMO | Admitting: Physician Assistant

## 2017-12-31 VITALS — BP 110/78 | HR 72 | Ht 71.0 in | Wt 225.4 lb

## 2017-12-31 DIAGNOSIS — I4892 Unspecified atrial flutter: Secondary | ICD-10-CM | POA: Diagnosis not present

## 2017-12-31 DIAGNOSIS — I10 Essential (primary) hypertension: Secondary | ICD-10-CM

## 2017-12-31 DIAGNOSIS — I480 Paroxysmal atrial fibrillation: Secondary | ICD-10-CM

## 2017-12-31 NOTE — Patient Instructions (Signed)
Medication Instructions:  Your physician recommends that you continue on your current medications as directed. Please refer to the Current Medication list given to you today.  Labwork: NONE  Testing/Procedures: NONE  Follow-Up: Your physician wants you to follow-up in: 2 to 3 months with Dr. Delton See.   If you need a refill on your cardiac medications before your next appointment, please call your pharmacy.

## 2018-02-11 ENCOUNTER — Telehealth: Payer: Self-pay | Admitting: Cardiology

## 2018-02-11 NOTE — Telephone Encounter (Signed)
Called patient about his weight increase. Informed patient that his weight has not increased that much since he saw us in the office (225 lbs) or when he was in the hospital (227 lbs). Patient denies any other symptoms. Encouraged patient to call his PCP about his weight and help on losing weight. Patient verbalized understanding.

## 2018-02-11 NOTE — Telephone Encounter (Signed)
New message   Pt c/o swelling: STAT is pt has developed SOB within 24 hours  1) How much weight have you gained and in what time span? 20lbs since April  2) If swelling, where is the swelling located?  ABDOMEN  Are you currently taking a fluid pill? YES 3) Are you currently SOB? NO  4) Do you have a log of your daily weights (if so, list)? 227 today, 223 on 6/23,   5) Have you gained 3 pounds in a day or 5 pounds in a week? YES  6) Have you traveled recently? NO

## 2018-03-09 ENCOUNTER — Observation Stay (HOSPITAL_COMMUNITY)
Admission: EM | Admit: 2018-03-09 | Discharge: 2018-03-10 | Disposition: A | Discharge status: Home / Self Care | Payer: Medicare HMO | Attending: Cardiology | Admitting: Cardiology

## 2018-03-09 ENCOUNTER — Encounter (HOSPITAL_COMMUNITY): Payer: Self-pay | Admitting: Emergency Medicine

## 2018-03-09 ENCOUNTER — Emergency Department (HOSPITAL_COMMUNITY): Payer: Medicare HMO

## 2018-03-09 DIAGNOSIS — Z7901 Long term (current) use of anticoagulants: Secondary | ICD-10-CM | POA: Diagnosis not present

## 2018-03-09 DIAGNOSIS — R079 Chest pain, unspecified: Secondary | ICD-10-CM

## 2018-03-09 DIAGNOSIS — I82409 Acute embolism and thrombosis of unspecified deep veins of unspecified lower extremity: Secondary | ICD-10-CM | POA: Diagnosis present

## 2018-03-09 DIAGNOSIS — K219 Gastro-esophageal reflux disease without esophagitis: Secondary | ICD-10-CM | POA: Diagnosis present

## 2018-03-09 DIAGNOSIS — Z87891 Personal history of nicotine dependence: Secondary | ICD-10-CM | POA: Diagnosis not present

## 2018-03-09 DIAGNOSIS — I480 Paroxysmal atrial fibrillation: Principal | ICD-10-CM | POA: Insufficient documentation

## 2018-03-09 DIAGNOSIS — R002 Palpitations: Secondary | ICD-10-CM

## 2018-03-09 DIAGNOSIS — Z79899 Other long term (current) drug therapy: Secondary | ICD-10-CM | POA: Diagnosis not present

## 2018-03-09 DIAGNOSIS — I10 Essential (primary) hypertension: Secondary | ICD-10-CM | POA: Diagnosis present

## 2018-03-09 DIAGNOSIS — R000 Tachycardia, unspecified: Secondary | ICD-10-CM | POA: Diagnosis present

## 2018-03-09 DIAGNOSIS — I4892 Unspecified atrial flutter: Secondary | ICD-10-CM | POA: Diagnosis present

## 2018-03-09 DIAGNOSIS — I4891 Unspecified atrial fibrillation: Secondary | ICD-10-CM | POA: Diagnosis present

## 2018-03-09 LAB — CBC
HCT: 47.7 % (ref 39.0–52.0)
Hemoglobin: 15.9 g/dL (ref 13.0–17.0)
MCH: 29.8 pg (ref 26.0–34.0)
MCHC: 33.3 g/dL (ref 30.0–36.0)
MCV: 89.3 fL (ref 78.0–100.0)
Platelets: 237 10*3/uL (ref 150–400)
RBC: 5.34 MIL/uL (ref 4.22–5.81)
RDW: 13.6 % (ref 11.5–15.5)
WBC: 16.4 10*3/uL — ABNORMAL HIGH (ref 4.0–10.5)

## 2018-03-09 LAB — BASIC METABOLIC PANEL
ANION GAP: 10 (ref 5–15)
BUN: 23 mg/dL — ABNORMAL HIGH (ref 6–20)
CO2: 24 mmol/L (ref 22–32)
Calcium: 8.8 mg/dL — ABNORMAL LOW (ref 8.9–10.3)
Chloride: 105 mmol/L (ref 98–111)
Creatinine, Ser: 1.32 mg/dL — ABNORMAL HIGH (ref 0.61–1.24)
GFR calc non Af Amer: >60 mL/min (ref >60)
Glucose, Bld: 124 mg/dL — ABNORMAL HIGH (ref 70–99)
Potassium: 4.3 mmol/L (ref 3.5–5.1)
Sodium: 139 mmol/L (ref 135–145)

## 2018-03-09 LAB — I-STAT TROPONIN, ED: Troponin i, poc: 0 ng/mL (ref 0.00–0.08)

## 2018-03-09 LAB — MRSA PCR SCREENING: MRSA by PCR: NEGATIVE

## 2018-03-09 MED ORDER — PANTOPRAZOLE SODIUM 40 MG PO TBEC
40.0000 mg | DELAYED_RELEASE_TABLET | Freq: Every day | ORAL | Status: DC
  Administered 2018-03-09 – 2018-03-10 (×2): 40 mg via ORAL
  Filled 2018-03-09 (×2): qty 1

## 2018-03-09 MED ORDER — ACETAMINOPHEN 325 MG PO TABS: 650.0000 mg | ORAL_TABLET | ORAL | Status: DC | PRN

## 2018-03-09 MED ORDER — APIXABAN 5 MG PO TABS
5.0000 mg | ORAL_TABLET | Freq: Two times a day (BID) | ORAL | Status: DC
  Administered 2018-03-09 – 2018-03-10 (×3): 5 mg via ORAL
  Filled 2018-03-09 (×3): qty 1

## 2018-03-09 MED ORDER — DILTIAZEM HCL ER COATED BEADS 240 MG PO CP24
240.0000 mg | ORAL_CAPSULE | Freq: Every day | ORAL | Status: DC
  Administered 2018-03-09: 240 mg via ORAL
  Filled 2018-03-09: qty 1

## 2018-03-09 MED ORDER — LACTATED RINGERS IV BOLUS
1000.0000 mL | Freq: Once | INTRAVENOUS | Status: AC
  Administered 2018-03-09: 1000 mL via INTRAVENOUS

## 2018-03-09 MED ORDER — ONDANSETRON HCL 4 MG/2ML IJ SOLN: 4.0000 mg | Freq: Four times a day (QID) | INTRAMUSCULAR | Status: DC | PRN

## 2018-03-09 MED ORDER — NITROGLYCERIN 0.4 MG SL SUBL: 0.4000 mg | SUBLINGUAL_TABLET | SUBLINGUAL | Status: DC | PRN

## 2018-03-09 MED ORDER — DILTIAZEM HCL ER COATED BEADS 240 MG PO CP24: 240.0000 mg | ORAL_CAPSULE | Freq: Every day | ORAL | Status: DC

## 2018-03-09 MED ORDER — SERTRALINE HCL 100 MG PO TABS
200.0000 mg | ORAL_TABLET | Freq: Every day | ORAL | Status: DC
  Administered 2018-03-10: 200 mg via ORAL
  Filled 2018-03-09: qty 2

## 2018-03-09 MED ORDER — OXYCODONE-ACETAMINOPHEN 5-325 MG PO TABS
1.0000 | ORAL_TABLET | ORAL | Status: DC | PRN
Start: 2018-03-09 — End: 2018-03-10

## 2018-03-09 MED ORDER — GABAPENTIN 300 MG PO CAPS
300.0000 mg | ORAL_CAPSULE | Freq: Every morning | ORAL | Status: DC
  Administered 2018-03-10: 300 mg via ORAL
  Filled 2018-03-09: qty 1

## 2018-03-09 MED ORDER — HYDROCODONE-ACETAMINOPHEN 5-325 MG PO TABS
1.0000 | ORAL_TABLET | Freq: Four times a day (QID) | ORAL | Status: DC | PRN
  Administered 2018-03-10: 1 via ORAL
  Filled 2018-03-09: qty 1

## 2018-03-09 MED ORDER — CYCLOBENZAPRINE HCL 10 MG PO TABS
10.0000 mg | ORAL_TABLET | Freq: Three times a day (TID) | ORAL | Status: DC
  Administered 2018-03-10: 10 mg via ORAL
  Filled 2018-03-09 (×3): qty 1

## 2018-03-09 NOTE — ED Notes (Signed)
MD aware patient BP in 80's but HR 99.

## 2018-03-09 NOTE — ED Triage Notes (Addendum)
Patient complains of tachycardia since yesterday, states his home monitor measured his heart rate in the 150's. Heart rate 153 in triage. Also complains of a headache, denies other symptoms. Patient alert, oriented, and in no apparent distress at this time.

## 2018-03-09 NOTE — ED Provider Notes (Signed)
Emergency Department Provider Note   I have reviewed the triage vital signs and the nursing notes.   HISTORY  Chief Complaint Tachycardia   HPI James Mccormick is a 52 y.o. male with medical problems documented below the presents the emergency department today secondary to palpitations.  Has history of atrial flutter and has had some palpitations since being diagnosed and actually increase his dosing of his diltiazem recently secondary to paroxysmal palpitations.  States he had decreased p.o. intake and was hot and sweaty yesterday felt he was dehydrated and palpitations started again.  He took an extra dose of his diltiazem which did not improve his symptoms.  He went to sleep woke up this morning still on palpitation a bit lightheadedness so he came here for evaluation.  Patient without any shortness of breath, lower extremity edema, altered mental status, recent cough, fever, change in urine output. No other associated or modifying symptoms.    Past Medical History:  Diagnosis Date  . Abdominal abscess   . Acute encephalopathy   . Acute kidney injury (HCC)   . Acute respiratory failure (HCC)   . Anemia   . Atrial fibrillation (HCC)    a. Documented 08/2013 in setting of severe illness, amiodarone used. Not felt to be anticoag candidate at that time.  . C. difficile colitis   . Cholecystitis   . Depression   . DVT (deep venous thrombosis) (HCC)   . Elevated troponin    a. 08/2013: felt due to demand ischemia in setting of severe illness.  Marland Kitchen GERD (gastroesophageal reflux disease)   . Hypertension   . LV dysfunction    a. 08/2013: EF 45% setting of severe illness.  . Perforated, severe stomach ulcer (HCC)   . Protein-calorie malnutrition (HCC)   . Sepsis [facility])     Patient Active Problem List   Diagnosis Date Noted  . Atrial flutter, paroxysmal (HCC) 03/09/2018  . DVT (deep venous thrombosis) (HCC) 12/04/2017  . Hypertension 12/04/2017  . Perforated chronic stomach  ulcer (HCC) 12/04/2017  . GERD (gastroesophageal reflux disease) 12/04/2017  . Atrial flutter (HCC) 11/27/2013  . Depression 09/26/2013  . Atrial fibrillation (HCC) 08/29/2013    Past Surgical History:  Procedure Laterality Date  . COLON SURGERY    . HERNIA REPAIR    . LAPAROTOMY N/A 08/28/2013   Procedure: EXPLORATORY LAPAROTOMY,debridment of nacrotic stomach,and primary repair of perforated stomach.;  Surgeon: Atilano Ina, MD;  Location: [facility] OR;  Service: General;  Laterality: N/A;  . NECK SURGERY      Current Outpatient Rx  . Order #: [id number] Class: Normal  . Order #: [id number] Class: OTC  . Order #: [id number] Class: Historical Med  . Order #: [id number] Class: Normal  . Order #: [id number] Class: Historical Med  . Order #: [id number] Class: Historical Med  . Order #: [id number] Class: Normal  . Order #: [id number] Class: Historical Med  . Order #: [id number] Class: Historical Med  . Order #: [id number] Class: Historical Med  . Order #: [id number] Class: Historical Med  . Order #: [id number] Class: Historical Med  . Order #: [id number] Class: Print    Allergies Patient has no known allergies.  Family History  Problem Relation Age of Onset  . Mental illness Mother   . Hypertension Sister   . Hypertension Brother     Social History Social History   Tobacco Use  . Smoking status: Former Smoker    Types: Cigarettes  . Smokeless tobacco: Never Used  . Tobacco comment: Quit 7 weeks before adm in 08/2013  Substance Use Topics  . Alcohol use: No  . Drug use: No    Review of Systems  All other systems negative except as documented in the HPI. All pertinent positives and negatives as reviewed in the HPI. ____________________________________________   PHYSICAL EXAM:  VITAL SIGNS: ED Triage Vitals  Enc Vitals Group     BP 03/09/18 1035 106/64     Pulse Rate 03/09/18 1035 (!) 156     Resp 03/09/18 1049 16     Temp 03/09/18 1035 (!) 96.8 F (36 C)     Temp Source 03/09/18 1035  Oral     SpO2 03/09/18 1035 98 %    Constitutional: Alert and oriented. Well appearing and in no acute distress. Eyes: Conjunctivae are normal. PERRL. EOMI. Head: Atraumatic. Nose: No congestion/rhinnorhea. Mouth/Throat: Mucous membranes are moist.  Oropharynx non-erythematous. Neck: No stridor.  No meningeal signs.   Cardiovascular: tachycardic rate, regular rhythm. Good peripheral circulation. Grossly normal heart sounds.   Respiratory: Normal respiratory effort.  No retractions. Lungs CTAB. Gastrointestinal: Soft and nontender. No distention.  Musculoskeletal: No lower extremity tenderness nor edema. No gross deformities of extremities. Neurologic:  Normal speech and language. No gross focal neurologic deficits are appreciated.  Skin:  Skin is warm, dry and intact. No rash noted.   ____________________________________________   LABS (all labs ordered are listed, but only abnormal results are displayed)  Labs Reviewed  BASIC METABOLIC PANEL - Abnormal; Notable for the following components:      Result Value   Glucose, Bld 124 (*)    BUN 23 (*)    Creatinine, Ser 1.32 (*)    Calcium 8.8 (*)    All other components within normal limits  CBC - Abnormal; Notable for the following components:   WBC 16.4 (*)    All other components within normal limits  I-STAT TROPONIN, ED   ____________________________________________  EKG   EKG Interpretation  Date/Time:  Saturday March 09 2018 10:35:39 EDT Ventricular Rate:  152 PR Interval:  88 QRS Duration: 146 QT Interval:  284 QTC Calculation: 451 R Axis:   -4 Text Interpretation:   Poor data quality, interpretation may be adversely affected Sinus tachycardia with short PR Right bundle branch block Abnormal ECG Confirmed by Marily MemosMesner, Riggin Cuttino [phone]([id number]) on 03/09/2018 11:49:48 AM       ____________________________________________  RADIOLOGY  Dg Chest Port 1 View  Result Date: 03/09/2018 CLINICAL DATA:  Tachycardia since yesterday.  EXAM: PORTABLE CHEST 1 VIEW COMPARISON:  Chest x-ray 12/03/2017 FINDINGS: External pacer paddles are in place. The heart is upper limits of normal in size given the AP projection and semi upright position. The mediastinal and hilar contours are within normal limits. Slightly low lung volumes with streaky bibasilar atelectasis but no infiltrates, effusions or edema. The bony thorax is intact. IMPRESSION: Streaky basilar atelectasis, right greater than left but no infiltrates or effusions. Electronically Signed   By: Rudie MeyerP.  Gallerani M.D.   On: 03/09/2018 12:32    ____________________________________________   PROCEDURES  Procedure(s) performed:   Procedures  CRITICAL CARE Performed by: Marily MemosMesner, Kannon Granderson Total critical care time: 35 minutes Critical care time was exclusive of separately billable procedures and treating other patients. Critical care was necessary to treat or prevent imminent or life-threatening deterioration. Critical care was time spent personally by me on the following activities: development of treatment plan with patient and/or surrogate as well as nursing, discussions with consultants, evaluation of patient's response to treatment, examination of patient, obtaining history from patient or  surrogate, ordering and performing treatments and interventions, ordering and review of laboratory studies, ordering and review of radiographic studies, pulse oximetry and re-evaluation of patient's condition.  ____________________________________________   INITIAL IMPRESSION / ASSESSMENT AND PLAN / ED COURSE  Patient here initially with atrial flutter with RVR however he appeared to be dehydrated and his history is more consistent with dehydration so fluids were started.  His heart rate improved significantly with the fluids as did his blood pressures.  He also felt better.  Consider electrical cardioversion however the patient did not truly seem unstable despite his blood pressure.  Feel like  it was more hypovolemia related.  Labs as seen.  Had at least 2 L of fluid in the emergency room.  I discussed it with Dr. Donnie Aho with cardiology who will see the patient for admission. He on Eliquis for his a flutter.  CHA2DS2/VAS Stroke Risk Points  Current as of 2 minutes ago     1 >= 2 Points: High Risk  1 - 1.99 Points: Medium Risk  0 Points: Low Risk    This is the only CHA2DS2/VAS Stroke Risk Points available for the past  year.:  Last Change: N/A     Details    This score determines the patient's risk of having a stroke if the  patient has atrial fibrillation.       Points Metrics  0 Has Congestive Heart Failure:  No    Current as of 2 minutes ago  0 Has Vascular Disease:  No    Current as of 2 minutes ago  1 Has Hypertension:  Yes    Current as of 2 minutes ago  0 Age:  71    Current as of 2 minutes ago  0 Has Diabetes:  No    Current as of 2 minutes ago  0 Had Stroke:  No  Had TIA:  No  Had thromboembolism:  No    Current as of 2 minutes ago  0 Male:  No    Current as of 2 minutes ago               Pertinent labs & imaging results that were available during my care of the patient were reviewed by me and considered in my medical decision making (see chart for details).  ____________________________________________  FINAL CLINICAL IMPRESSION(S) / ED DIAGNOSES  Final diagnoses:  Palpitations  Atrial flutter with rapid ventricular response (HCC)     MEDICATIONS GIVEN DURING THIS VISIT:  Medications  lactated ringers bolus 1,000 mL (0 mLs Intravenous Stopped 03/09/18 1616)     NEW OUTPATIENT MEDICATIONS STARTED DURING THIS VISIT:  New Prescriptions   No medications on file    Note:  This note was prepared with assistance of Dragon voice recognition software. Occasional wrong-word or sound-a-like substitutions may have occurred due to the inherent limitations of voice recognition software.   Gayle Collard, Barbara Cower, MD 03/09/18 [phone]

## 2018-03-09 NOTE — H&P (Signed)
History and Physical   Admit date: 03/09/2018 Name:  James Mccormick Medical record number: [id number] DOB/Age:  54/31/1967  52 y.o. male  [facility]   [facility]  Primary Cardiologist:  Dr. Delton SeeNelson  [facility]   [facility]  Chief complaint/reason for admission: fast heart rate, weakness  HPI:  This 52 year old male had a history of paroxysmal atrial fibrillation occurring in the setting of critical illness in the setting of a perforated ulcer with multiple complications in 2015.  At the time he had slightly depressed LV function and was not felt to be a candidate for anticoagulation was managed initially with amiodarone after having failed a cardioversion acutely.  He did fairly well but had recurrent atrial flutter and was admitted to the hospital with chest discomfort in April of this year.  He converted back to sinus rhythm and was placed on Eliquis and diltiazem at that time.  He has had some episodes of palpitations as well as jittery feeling.  He has been seen once since then and was in sinus on return.  He was seen recently by his family doctor and was in atrial flutter at her office and was noted to be somewhat weak and thought to be attributed to the heat.  He had some episodes for the heart rate would go up and he was advised to come to the emergency room if this persisted.  He experienced worsening of rapid heartbeat and had an episode today where he became rapid particularly when he would get up and move around and he presented to the emergency room he was found to be in atrial flutter with 2 to one block.  He has been given IV fluids as well as additional diltiazem and heart rate is currently better controlled with him lying down.  He does have elevation of his heart rate when he does most any type of activity.  He is admitted to the hospital this time with difficult to control atrial flutter.  He has a remote history of DVT and also  has a history of hypertension previously.  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    Past Medical History:  Diagnosis Date  . Abdominal abscess   . Acute encephalopathy   . Acute kidney injury (HCC)   . Acute respiratory failure (HCC)   . Anemia   . Atrial fibrillation (HCC)    a. Documented 08/2013 in setting of severe illness, amiodarone used. Not felt to be anticoag candidate at that time.  . C. difficile colitis   . Cholecystitis   . Depression   . DVT (deep venous thrombosis) (HCC)   . Elevated troponin    a. 08/2013: felt due to demand ischemia in setting of severe illness.  Marland Kitchen. GERD (gastroesophageal reflux disease)   . Hypertension   . LV dysfunction    a. 08/2013: EF 45% setting of severe illness.  . Perforated, severe stomach ulcer (HCC)   . Protein-calorie malnutrition (HCC)   . Sepsis [facility](HCC)      Past Surgical History:  Procedure Laterality Date  . COLON SURGERY    . HERNIA REPAIR    . LAPAROTOMY N/A 08/28/2013   Procedure: EXPLORATORY LAPAROTOMY,debridment of nacrotic stomach,and primary repair of perforated stomach.;  Surgeon: Atilano InaEric M Wilson, MD;  [facility];  Service: General;  Laterality: N/A;  . NECK SURGERY     Allergies: has No Known Allergies.  Medications: Prior to Admission medications   Medication Sig Start Date End Date Taking? Authorizing Provider  apixaban (ELIQUIS) 5 MG TABS tablet Take 1 tablet (5 mg total) by mouth 2 (two) times daily. 12/04/17   Georgie Chard D, NP  aspirin EC 81 MG EC tablet Take 1 tablet (81 mg total) by mouth daily. 12/05/17   Filbert Schilder, NP  cyclobenzaprine (FLEXERIL) 10 MG tablet Take 10 mg by mouth 3 (three) times daily. 07/19/15   [provider]  diltiazem (CARDIZEM CD) 120 MG 24 hr capsule Take 1 capsule (120 mg total) by mouth daily. 12/05/17   Georgie Chard D, NP  gabapentin (NEURONTIN) 300 MG capsule Take 300 mg by mouth every morning.  02/01/15   [provider]  HYDROcodone-acetaminophen (NORCO/VICODIN) 5-325 MG tablet Take 1 tablet by mouth 2 (two) times daily as needed for pain. 10/30/17   [provider]  nitroGLYCERIN (NITROSTAT) 0.4 MG SL tablet Place 1 tablet (0.4 mg total) under the tongue every 5 (five) minutes x 3 doses as needed for chest pain. 12/04/17   Filbert Schilder, NP  omeprazole (PRILOSEC) 40 MG capsule Take 40 mg by mouth 2 (two) times daily. 09/11/17   [provider]  oxyCODONE-acetaminophen (PERCOCET) 5-325 MG tablet Take 1-2 tablets by mouth every 4 (four) hours as needed for severe pain. 11/23/15   Pricilla Loveless, MD  sertraline (ZOLOFT) 100 MG tablet Take 200 mg by mouth daily.     [provider]  tadalafil (CIALIS) 20 MG tablet Take 20 mg by mouth daily as needed for erectile dysfunction.  07/08/15 08/07/15  [provider]  valsartan (DIOVAN) 80 MG tablet Take 80 mg by mouth daily. 11/30/17   [provider]   Family History:  Family Status  Relation Name Status  . Mother  Deceased  . Sister  (Not Specified)  . Brother  (Not Specified)  . Father  Deceased  . MGM  Deceased  . MGF  Deceased  . PGM  Deceased  . PGF  Deceased    Social History:   reports that he has quit smoking. His smoking use included cigarettes. He has never used smokeless tobacco. He reports that he does not drink alcohol or use drugs.   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  He has a history of alcoholism but has not drank in 18 years according to him.   Review of Systems: He has been obese for several years.  He has erectile dysfunction.  He has had recent evaluation for shoulder impingement syndrome and has had shoulder injections.  He has also had lumbar disc disease with injections in his spine in the past.  No recent GI symptoms and no recurrent bleeding since his perforated ulcer. Other than as noted above, the remainder of the review of systems is normal  Physical Exam: BP  93/71   Pulse 99   Temp (!) 96.8 F (36 C) (Oral)   Resp (!) 21   SpO2 95%   Gen.: He is a pleasant obese white male in no acute distress Head: Normocephalic, without obvious abnormality, atraumatic Eyes: conjunctivae/corneas clear. PERRL, EOM's intact. Fundi benign. Neck: no adenopathy, no carotid bruit, no JVD and supple, symmetrical, trachea midline Lungs: clear to auscultation bilaterally Heart: Irregular rhythm, normal S1 and S2, no S3 or murmur Abdomen: soft, non-tender; bowel sounds normal; no masses,  no organomegaly Rectal: deferred Extremities: extremities normal, atraumatic, no cyanosis or edema Pulses: 2+ and  symmetric Skin: Skin color, texture, turgor normal. No rashes or lesions Neurologic: Grossly normal Psych: Alert and oriented 3  Labs: CBC Recent Labs    03/09/18 1036  WBC 16.4*  RBC 5.34  HGB 15.9  HCT 47.7  PLT 237  MCV 89.3  MCH 29.8  MCHC 33.3  RDW 13.6   CMP  Recent Labs    03/09/18 1036  NA 139  K 4.3  CL 105  CO2 24  GLUCOSE 124*  BUN 23*  CREATININE 1.32*  CALCIUM 8.8*  GFRNONAA >60  GFRAA >60    EKG: Atrial flutter with 2 to one block Independently reviewed by me  Radiology: Atelectasis noted at the bases   IMPRESSIONS: 1.  Recurrent atrial flutter with rapid response 2.  Hypertension 3.  Chronic kidney disease stage 2-3 4.  Obesity 5.  History of perforated ulcer 6.  Long-term use of anticoagulation has not missed any doses.  PLAN: He'll be brought in to the hospital.  We will increase his rate control medication.  Intravenous fluids.  I will last for an EP consult to consider whether he would be a candidate for ablation that could help with his rapid response that he has had difficulty with recently.  Signed: Darden Palmer MD [facility]  03/09/2018, 2:54 PM

## 2018-03-10 DIAGNOSIS — I10 Essential (primary) hypertension: Secondary | ICD-10-CM | POA: Diagnosis not present

## 2018-03-10 DIAGNOSIS — I4892 Unspecified atrial flutter: Secondary | ICD-10-CM | POA: Diagnosis not present

## 2018-03-10 DIAGNOSIS — I480 Paroxysmal atrial fibrillation: Secondary | ICD-10-CM | POA: Diagnosis not present

## 2018-03-10 DIAGNOSIS — Z79899 Other long term (current) drug therapy: Secondary | ICD-10-CM | POA: Diagnosis not present

## 2018-03-10 DIAGNOSIS — Z87891 Personal history of nicotine dependence: Secondary | ICD-10-CM | POA: Diagnosis not present

## 2018-03-10 LAB — BASIC METABOLIC PANEL
Anion gap: 6 (ref 5–15)
BUN: 17 mg/dL (ref 6–20)
CALCIUM: 8.5 mg/dL — AB (ref 8.9–10.3)
CO2: 26 mmol/L (ref 22–32)
Chloride: 110 mmol/L (ref 98–111)
Creatinine, Ser: 1.21 mg/dL (ref 0.61–1.24)
GFR calc Af Amer: >60 mL/min (ref >60)
GFR calc non Af Amer: >60 mL/min (ref >60)
Glucose, Bld: 97 mg/dL (ref 70–99)
POTASSIUM: 4.1 mmol/L (ref 3.5–5.1)
Sodium: 142 mmol/L (ref 135–145)

## 2018-03-10 MED ORDER — DILTIAZEM HCL ER COATED BEADS 180 MG PO CP24
360.0000 mg | ORAL_CAPSULE | Freq: Every day | ORAL | Status: DC
  Administered 2018-03-10: 360 mg via ORAL
  Filled 2018-03-10: qty 2

## 2018-03-10 MED ORDER — METOPROLOL TARTRATE 25 MG PO TABS
25.0000 mg | ORAL_TABLET | Freq: Two times a day (BID) | ORAL | Status: DC
  Administered 2018-03-10 (×2): 25 mg via ORAL
  Filled 2018-03-10 (×2): qty 1

## 2018-03-10 MED ORDER — DILTIAZEM HCL ER COATED BEADS 360 MG PO CP24: 360.0000 mg | ORAL_CAPSULE | Freq: Every day | ORAL | 3 refills | Status: DC

## 2018-03-10 MED ORDER — METOPROLOL TARTRATE 25 MG PO TABS: 25.0000 mg | ORAL_TABLET | Freq: Two times a day (BID) | ORAL | 5 refills | Status: DC

## 2018-03-10 NOTE — Progress Notes (Signed)
Discharge instructions reviewed with pt. Pt denies any pain. Pt states he is ready for d.c. Pt has no questions at this time. IV d/c

## 2018-03-10 NOTE — H&P (View-Only) (Signed)
 Cardiology Consultation:   Patient ID: James Mccormick; [id number]; 11/18/1965   Admit date: 03/09/2018 Date of Consult: 03/10/2018  Primary Care Provider: Hemberg, Katherine V, NP Primary Cardiologist: Katarina Nelson, MD  Primary Electrophysiologist:  new   Patient Profile:   James Mccormick is a 52 y.o. male with a hx of atrial flutter who is being seen today for the evaluation of atrial flutter at the request of Dr. Tilley.  History of Present Illness:   James Mccormick was in the hospital 4 years ago with a ruptured viscous and had atrial flutter. (it was described as fib but the only ECG I see is atrial flutter with a RVR). He represented 3 months ago with atrial flutter and reverted back to NSR. He has seen Dr. Nelson and had his dose of cardizem increased to 240 mg daily. He represented yesterday with atrial flutter with an RVR associated with sob. He denies missing any of his cardizem or Eliquis. No syncope.   Past Medical History:  Diagnosis Date  . Abdominal abscess   . Acute encephalopathy   . Acute kidney injury (HCC)   . Acute respiratory failure (HCC)   . Anemia   . Atrial fibrillation (HCC)    a. Documented 08/2013 in setting of severe illness, amiodarone used. Not felt to be anticoag candidate at that time.  . C. difficile colitis   . Cholecystitis   . Depression   . DVT (deep venous thrombosis) (HCC)   . Elevated troponin    a. 08/2013: felt due to demand ischemia in setting of severe illness.  . GERD (gastroesophageal reflux disease)   . Hypertension   . LV dysfunction    a. 08/2013: EF 45% setting of severe illness.  . Perforated, severe stomach ulcer (HCC)   . Protein-calorie malnutrition (HCC)   . Sepsis (HCC)     Past Surgical History:  Procedure Laterality Date  . COLON SURGERY    . HERNIA REPAIR    . LAPAROTOMY N/A 08/28/2013   Procedure: EXPLORATORY LAPAROTOMY,debridment of nacrotic stomach,and primary repair of perforated stomach.;  Surgeon: Eric M  Wilson, MD;  Location: [facility] OR;  Service: General;  Laterality: N/A;  . NECK SURGERY       Home Medications:  Prior to Admission medications   Medication Sig Start Date End Date Taking? Authorizing Provider  apixaban (ELIQUIS) 5 MG TABS tablet Take 1 tablet (5 mg total) by mouth 2 (two) times daily. 12/04/17  Yes McDaniel, Jill D, NP  aspirin EC 81 MG EC tablet Take 1 tablet (81 mg total) by mouth daily. 12/05/17  Yes McDaniel, Jill D, NP  cyclobenzaprine (FLEXERIL) 10 MG tablet Take 10 mg by mouth daily as needed for muscle spasms.  07/19/15  Yes [provider]  diltiazem (CARDIZEM CD) 120 MG 24 hr capsule Take 1 capsule (120 mg total) by mouth daily. Patient taking differently: Take 120 mg by mouth 2 (two) times daily.  12/05/17  Yes McDaniel, Jill D, NP  gabapentin (NEURONTIN) 300 MG capsule Take 300 mg by mouth every morning.  02/01/15  Yes [provider]  HYDROcodone-acetaminophen (NORCO/VICODIN) 5-325 MG tablet Take 1 tablet by mouth 2 (two) times daily as needed for pain. 10/30/17  Yes [provider]  nitroGLYCERIN (NITROSTAT) 0.4 MG SL tablet Place 1 tablet (0.4 mg total) under the tongue every 5 (five) minutes x 3 doses as needed for chest pain. 12/04/17  Yes McDaniel, Jill D, NP  omeprazole (PRILOSEC) 40 MG capsule   Take 40 mg by mouth 2 (two) times daily. 09/11/17  Yes [provider]  Potassium (POTASSIMIN PO) Take 1 tablet by mouth as needed (cramps).   Yes [provider]  sertraline (ZOLOFT) 100 MG tablet Take 200 mg by mouth daily.    Yes [provider]  tadalafil (CIALIS) 20 MG tablet Take 20 mg by mouth daily as needed for erectile dysfunction.  07/08/15 03/09/18 Yes [provider]  valsartan (DIOVAN) 80 MG tablet Take 40 mg by mouth daily as needed (high blood pressure uncontrolled).  11/30/17  Yes [provider]  oxyCODONE-acetaminophen (PERCOCET) 5-325 MG tablet Take 1-2 tablets by mouth every 4 (four) hours as  needed for severe pain. Patient not taking: Reported on 03/09/2018 11/23/15   Goldston, Scott, MD    Inpatient Medications: Scheduled Meds: . apixaban  5 mg Oral BID  . cyclobenzaprine  10 mg Oral TID  . diltiazem  360 mg Oral Daily  . gabapentin  300 mg Oral q morning - 10a  . metoprolol tartrate  25 mg Oral BID  . pantoprazole  40 mg Oral Daily  . sertraline  200 mg Oral Daily   Continuous Infusions:  PRN Meds: acetaminophen, HYDROcodone-acetaminophen, nitroGLYCERIN, ondansetron (ZOFRAN) IV, oxyCODONE-acetaminophen  Allergies:   No Known Allergies  Social History:   Social History   Socioeconomic History  . Marital status: Divorced    Spouse name: Not on file  . Number of children: Not on file  . Years of education: Not on file  . Highest education level: Not on file  Occupational History  . Not on file  Social Needs  . Financial resource strain: Not on file  . Food insecurity:    Worry: Not on file    Inability: Not on file  . Transportation needs:    Medical: Not on file    Non-medical: Not on file  Tobacco Use  . Smoking status: Former Smoker    Types: Cigarettes  . Smokeless tobacco: Never Used  . Tobacco comment: Quit 7 weeks before adm in 08/2013  Substance and Sexual Activity  . Alcohol use: No  . Drug use: No  . Sexual activity: Not on file  Lifestyle  . Physical activity:    Days per week: Not on file    Minutes per session: Not on file  . Stress: Not on file  Relationships  . Social connections:    Talks on phone: Not on file    Gets together: Not on file    Attends religious service: Not on file    Active member of club or organization: Not on file    Attends meetings of clubs or organizations: Not on file    Relationship status: Not on file  . Intimate partner violence:    Fear of current or ex partner: Not on file    Emotionally abused: Not on file    Physically abused: Not on file    Forced sexual activity: Not on file  Other Topics  Concern  . Not on file  Social History Narrative  . Not on file    Family History:    Family History  Problem Relation Age of Onset  . Mental illness Mother   . Hypertension Sister   . Hypertension Brother      ROS:  Please see the history of present illness.  Feels weak and fatigued in atrial flutter All other ROS reviewed and negative.     Physical Exam/Data:   Vitals:     03/09/18 2347 03/10/18 0500 03/10/18 0838 03/10/18 1134  BP: 112/73 121/73 126/81 114/86  Pulse: (!) 135 98 66   Resp: 16 14 16 18  Temp: 98.3 F (36.8 C) 98.3 F (36.8 C) 98.1 F (36.7 C) 98 F (36.7 C)  TempSrc: Oral Oral Oral Oral  SpO2: 96% 95% 96% 96%  Weight:      Height:        Intake/Output Summary (Last 24 hours) at 03/10/2018 1556 Last data filed at 03/10/2018 1200 Gross per 24 hour  Intake 480 ml  Output 950 ml  Net -470 ml   Filed Weights   03/09/18 2030 03/09/18 2111  Weight: 227 lb (103 kg) 238 lb (108 kg)   Body mass index is 33.19 kg/m.  General:  Well nourished, well developed, in no acute distress HEENT: normal Lymph: no adenopathy Neck: 6 cm JVD Endocrine:  No thryomegaly Vascular: No carotid bruits; FA pulses 2+ bilaterally without bruits  Cardiac:  normal S1, S2; IRRR; no murmur  Lungs:  clear to auscultation bilaterally, no wheezing, rhonchi or rales  Abd: soft, nontender, no hepatomegaly  Ext: no edema Musculoskeletal:  No deformities, BUE and BLE strength normal and equal Skin: warm and dry  Neuro:  CNs 2-12 intact, no focal abnormalities noted Psych:  Normal affect   EKG:  The EKG was personally reviewed and demonstrates:  Atrial flutter with a controlled VR Telemetry:  Telemetry was personally reviewed and demonstrates:  Atrial flutter with a controled. V  Relevant CV Studies: none  Laboratory Data:  Chemistry Recent Labs  Lab 03/09/18 1036 03/10/18 0417  NA 139 142  K 4.3 4.1  CL 105 110  CO2 24 26  GLUCOSE 124* 97  BUN 23* 17  CREATININE  1.32* 1.21  CALCIUM 8.8* 8.5*  GFRNONAA >60 >60  GFRAA >60 >60  ANIONGAP 10 6    No results for input(s): PROT, ALBUMIN, AST, ALT, ALKPHOS, BILITOT in the last 168 hours. Hematology Recent Labs  Lab 03/09/18 1036  WBC 16.4*  RBC 5.34  HGB 15.9  HCT 47.7  MCV 89.3  MCH 29.8  MCHC 33.3  RDW 13.6  PLT 237   Cardiac EnzymesNo results for input(s): TROPONINI in the last 168 hours.  Recent Labs  Lab 03/09/18 1041  TROPIPOC 0.00    BNPNo results for input(s): BNP, PROBNP in the last 168 hours.  DDimer No results for input(s): DDIMER in the last 168 hours.  Radiology/Studies:  Dg Chest Port 1 View  Result Date: 03/09/2018 CLINICAL DATA:  Tachycardia since yesterday. EXAM: PORTABLE CHEST 1 VIEW COMPARISON:  Chest x-ray 12/03/2017 FINDINGS: External pacer paddles are in place. The heart is upper limits of normal in size given the AP projection and semi upright position. The mediastinal and hilar contours are within normal limits. Slightly low lung volumes with streaky bibasilar atelectasis but no infiltrates, effusions or edema. The bony thorax is intact. IMPRESSION: Streaky basilar atelectasis, right greater than left but no infiltrates or effusions. Electronically Signed   By: P.  Gallerani M.D.   On: 03/09/2018 12:32    Assessment and Plan:   1. Atrial flutter - he has had recurrent medical refractory atrial flutter. I have discussed the treatment options with the patient. The risks/benefit/goals/ expectations of EP study and catheter ablation were reviewed and he wishes to proceed. We will schedule. He can be discharged and return.    For questions or updates, please contact [facility] Please consult [email or web address] for contact info under   Cardiology/STEMI.   Signed, Rayshun Kandler, MD  03/10/2018 3:56 PM 

## 2018-03-10 NOTE — Consult Note (Signed)
Cardiology Consultation:   Patient ID: James Mccormick; [id number]; 02/19/1966   Admit date: 03/09/2018 Date of Consult: 03/10/2018  Primary Care Provider: Rebecka ApleyHemberg, Katherine V, NP Primary Cardiologist: James AlexanderKatarina Nelson, Mccormick  Primary Electrophysiologist:  new   Patient Profile:   James Mccormick is a 52 y.o. male with a hx of atrial flutter who is being seen today for the evaluation of atrial flutter at the request of James Mccormick.  History of Present Illness:   [name] was in the hospital 4 years ago with a ruptured viscous and had atrial flutter. (it was described as fib but the only ECG I see is atrial flutter with a RVR). He represented 3 months ago with atrial flutter and reverted back to NSR. He has seen James Mccormick and had his dose of cardizem increased to 240 mg daily. He represented yesterday with atrial flutter with an RVR associated with sob. He denies missing any of his cardizem or Eliquis. No syncope.   Past Medical History:  Diagnosis Date  . Abdominal abscess   . Acute encephalopathy   . Acute kidney injury (HCC)   . Acute respiratory failure (HCC)   . Anemia   . Atrial fibrillation (HCC)    a. Documented 08/2013 in setting of severe illness, amiodarone used. Not felt to be anticoag candidate at that time.  . C. difficile colitis   . Cholecystitis   . Depression   . DVT (deep venous thrombosis) (HCC)   . Elevated troponin    a. 08/2013: felt due to demand ischemia in setting of severe illness.  Marland Kitchen. GERD (gastroesophageal reflux disease)   . Hypertension   . LV dysfunction    a. 08/2013: EF 45% setting of severe illness.  . Perforated, severe stomach ulcer (HCC)   . Protein-calorie malnutrition (HCC)   . Sepsis [facility](HCC)     Past Surgical History:  Procedure Laterality Date  . COLON SURGERY    . HERNIA REPAIR    . LAPAROTOMY N/A 08/28/2013   Procedure: EXPLORATORY LAPAROTOMY,debridment of nacrotic stomach,and primary repair of perforated stomach.;  Surgeon: James InaEric M  Wilson, Mccormick;  [facility];  Service: General;  Laterality: N/A;  . NECK SURGERY       Home Medications:  Prior to Admission medications   Medication Sig Start Date End Date Taking? Authorizing Provider  apixaban (ELIQUIS) 5 MG TABS tablet Take 1 tablet (5 mg total) by mouth 2 (two) times daily. 12/04/17  Yes James ChardMcDaniel, James D, NP  aspirin EC 81 MG EC tablet Take 1 tablet (81 mg total) by mouth daily. 12/05/17  Yes James ChardMcDaniel, James D, NP  cyclobenzaprine (FLEXERIL) 10 MG tablet Take 10 mg by mouth daily as needed for muscle spasms.  07/19/15  Yes Provider, Historical, Mccormick  diltiazem (CARDIZEM CD) 120 MG 24 hr capsule Take 1 capsule (120 mg total) by mouth daily. Patient taking differently: Take 120 mg by mouth 2 (two) times daily.  12/05/17  Yes James ChardMcDaniel, James D, NP  gabapentin (NEURONTIN) 300 MG capsule Take 300 mg by mouth every morning.  02/01/15  Yes Provider, Historical, Mccormick  HYDROcodone-acetaminophen (NORCO/VICODIN) 5-325 MG tablet Take 1 tablet by mouth 2 (two) times daily as needed for pain. 10/30/17  Yes Provider, Historical, Mccormick  nitroGLYCERIN (NITROSTAT) 0.4 MG SL tablet Place 1 tablet (0.4 mg total) under the tongue every 5 (five) minutes x 3 doses as needed for chest pain. 12/04/17  Yes James ChardMcDaniel, James D, NP  omeprazole (PRILOSEC) 40 MG capsule  Take 40 mg by mouth 2 (two) times daily. 09/11/17  Yes Provider, Historical, Mccormick  Potassium (POTASSIMIN PO) Take 1 tablet by mouth as needed (cramps).   Yes Provider, Historical, Mccormick  sertraline (ZOLOFT) 100 MG tablet Take 200 mg by mouth daily.    Yes Provider, Historical, Mccormick  tadalafil (CIALIS) 20 MG tablet Take 20 mg by mouth daily as needed for erectile dysfunction.  07/08/15 03/09/18 Yes Provider, Historical, Mccormick  valsartan (DIOVAN) 80 MG tablet Take 40 mg by mouth daily as needed (high blood pressure uncontrolled).  11/30/17  Yes Provider, Historical, Mccormick  oxyCODONE-acetaminophen (PERCOCET) 5-325 MG tablet Take 1-2 tablets by mouth every 4 (four) hours as  needed for severe pain. Patient not taking: Reported on 03/09/2018 11/23/15   James Mccormick    Inpatient Medications: Scheduled Meds: . apixaban  5 mg Oral BID  . cyclobenzaprine  10 mg Oral TID  . diltiazem  360 mg Oral Daily  . gabapentin  300 mg Oral q morning - 10a  . metoprolol tartrate  25 mg Oral BID  . pantoprazole  40 mg Oral Daily  . sertraline  200 mg Oral Daily   Continuous Infusions:  PRN Meds: acetaminophen, HYDROcodone-acetaminophen, nitroGLYCERIN, ondansetron (ZOFRAN) IV, oxyCODONE-acetaminophen  Allergies:   No Known Allergies  Social History:   Social History   Socioeconomic History  . Marital status: Divorced    Spouse name: Not on file  . Number of children: Not on file  . Years of education: Not on file  . Highest education level: Not on file  Occupational History  . Not on file  Social Needs  . Financial resource strain: Not on file  . Food insecurity:    Worry: Not on file    Inability: Not on file  . Transportation needs:    Medical: Not on file    Non-medical: Not on file  Tobacco Use  . Smoking status: Former Smoker    Types: Cigarettes  . Smokeless tobacco: Never Used  . Tobacco comment: Quit 7 weeks before adm in 08/2013  Substance and Sexual Activity  . Alcohol use: No  . Drug use: No  . Sexual activity: Not on file  Lifestyle  . Physical activity:    Days per week: Not on file    Minutes per session: Not on file  . Stress: Not on file  Relationships  . Social connections:    Talks on phone: Not on file    Gets together: Not on file    Attends religious service: Not on file    Active member of club or organization: Not on file    Attends meetings of clubs or organizations: Not on file    Relationship status: Not on file  . Intimate partner violence:    Fear of current or ex partner: Not on file    Emotionally abused: Not on file    Physically abused: Not on file    Forced sexual activity: Not on file  Other Topics  Concern  . Not on file  Social History Narrative  . Not on file    Family History:    Family History  Problem Relation Age of Onset  . Mental illness Mother   . Hypertension Sister   . Hypertension Brother      ROS:  Please see the history of present illness.  Feels weak and fatigued in atrial flutter All other ROS reviewed and negative.     Physical Exam/Data:   Vitals:  03/09/18 2347 03/10/18 0500 03/10/18 0838 03/10/18 1134  BP: 112/73 121/73 126/81 114/86  Pulse: (!) 135 98 66   Resp: 16 14 16 18   Temp: 98.3 F (36.8 C) 98.3 F (36.8 C) 98.1 F (36.7 C) 98 F (36.7 C)  TempSrc: Oral Oral Oral Oral  SpO2: 96% 95% 96% 96%  Weight:      Height:        Intake/Output Summary (Last 24 hours) at 03/10/2018 1556 Last data filed at 03/10/2018 1200 Gross per 24 hour  Intake 480 ml  Output 950 ml  Net -470 ml   Filed Weights   03/09/18 2030 03/09/18 2111  Weight: 227 lb (103 kg) 238 lb (108 kg)   Body mass index is 33.19 kg/m.  General:  Well nourished, well developed, in no acute distress HEENT: normal Lymph: no adenopathy Neck: 6 cm JVD Endocrine:  No thryomegaly Vascular: No carotid bruits; FA pulses 2+ bilaterally without bruits  Cardiac:  normal S1, S2; IRRR; no murmur  Lungs:  clear to auscultation bilaterally, no wheezing, rhonchi or rales  Abd: soft, nontender, no hepatomegaly  Ext: no edema Musculoskeletal:  No deformities, BUE and BLE strength normal and equal Skin: warm and dry  Neuro:  CNs 2-12 intact, no focal abnormalities noted Psych:  Normal affect   EKG:  The EKG was personally reviewed and demonstrates:  Atrial flutter with a controlled VR Telemetry:  Telemetry was personally reviewed and demonstrates:  Atrial flutter with a controled. Mccormick  Relevant CV Studies: none  Laboratory Data:  Chemistry Recent Labs  Lab 03/09/18 1036 03/10/18 0417  NA 139 142  K 4.3 4.1  CL 105 110  CO2 24 26  GLUCOSE 124* 97  BUN 23* 17  CREATININE  1.32* 1.21  CALCIUM 8.8* 8.5*  GFRNONAA >60 >60  GFRAA >60 >60  ANIONGAP 10 6    No results for input(s): PROT, ALBUMIN, AST, ALT, ALKPHOS, BILITOT in the last 168 hours. Hematology Recent Labs  Lab 03/09/18 1036  WBC 16.4*  RBC 5.34  HGB 15.9  HCT 47.7  MCV 89.3  MCH 29.8  MCHC 33.3  RDW 13.6  PLT 237   Cardiac EnzymesNo results for input(s): TROPONINI in the last 168 hours.  Recent Labs  Lab 03/09/18 1041  TROPIPOC 0.00    BNPNo results for input(s): BNP, PROBNP in the last 168 hours.  DDimer No results for input(s): DDIMER in the last 168 hours.  Radiology/Studies:  Dg Chest Port 1 View  Result Date: 03/09/2018 CLINICAL DATA:  Tachycardia since yesterday. EXAM: PORTABLE CHEST 1 VIEW COMPARISON:  Chest x-ray 12/03/2017 FINDINGS: External pacer paddles are in place. The heart is upper limits of normal in size given the AP projection and semi upright position. The mediastinal and hilar contours are within normal limits. Slightly low lung volumes with streaky bibasilar atelectasis but no infiltrates, effusions or edema. The bony thorax is intact. IMPRESSION: Streaky basilar atelectasis, right greater than left but no infiltrates or effusions. Electronically Signed   By: Rudie Meyer M.Mccormick.   On: 03/09/2018 12:32    Assessment and Plan:   1. Atrial flutter - he has had recurrent medical refractory atrial flutter. I have discussed the treatment options with the patient. The risks/benefit/goals/ expectations of EP study and catheter ablation were reviewed and he wishes to proceed. We will schedule. He can be discharged and return.    For questions or updates, please contact [facility] Please consult [email or web address] for contact info under  Cardiology/STEMI.   Signed, Lewayne Bunting, Mccormick  03/10/2018 3:56 PM

## 2018-03-10 NOTE — Discharge Instructions (Signed)
Return on 03/18/2018 for EP study with ablation. Procedure scheduled for 12PM, need to arrive by 10AM. Take all meds on 7/28, but none on 7/29.

## 2018-03-10 NOTE — Discharge Summary (Addendum)
Discharge Summary    Patient ID: James Mccormick,  MRN: [id number], DOB/AGE: 1966-07-08 52 y.o.  Admit date: 03/09/2018 Discharge date: 03/10/2018  Primary Care Provider: Rebecka Apley Primary Cardiologist: Tobias Alexander, MD  Discharge Diagnoses    Principal Problem:   Atrial flutter, paroxysmal [facility]) Active Problems:   Atrial fibrillation (HCC)   DVT (deep venous thrombosis) (HCC)   Hypertension   GERD (gastroesophageal reflux disease)   Allergies No Known Allergies  Diagnostic Studies/Procedures    None _____________   History of Present Illness     This 52 year old male had a history of paroxysmal atrial fibrillation occurring in the setting of critical illness in the setting of a perforated ulcer with multiple complications in 2015.  At the time he had slightly depressed LV function and was not felt to be a candidate for anticoagulation was managed initially with amiodarone after having failed a cardioversion acutely.  He did fairly well but had recurrent atrial flutter and was admitted to the hospital with chest discomfort in April of this year.  He converted back to sinus rhythm and was placed on Eliquis and diltiazem at that time.  He has had some episodes of palpitations as well as jittery feeling.  He has been seen once since then and was in sinus on return.  He was seen recently by his family doctor and was in atrial flutter at her office and was noted to be somewhat weak and thought to be attributed to the heat.  He had some episodes for the heart rate would go up and he was advised to come to the emergency room if this persisted.  He experienced worsening of rapid heartbeat and had an episode today where he became rapid particularly when he would get up and move around and he presented to the emergency room he was found to be in atrial flutter with 2 to one block.  He has been given IV fluids as well as additional diltiazem and heart rate is currently better  controlled with him lying down.  He does have elevation of his heart rate when he does most any type of activity.  He is admitted to the hospital this time with difficult to control atrial flutter.  He has a remote history of DVT and also has a history of hypertension previously.  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  Hospital Course     Consultants: Dr. Ladona Ridgel EP  Patient was admitted to cardiology service for rate control.  His home Cardizem has been increased to 360 mg daily.  Metoprolol 25 mg twice daily has also been added to his medical regimen.  Patient was seen by Dr. Ladona Ridgel on 03/10/2018, various options has been discussed with the patient, he was agreeable to undergo EP study with catheter ablation.  This will be set up for 03/18/2018 with Dr. Ladona Ridgel around 12 PM.  He has been instructed to arrive by 10 AM to [facility].  He should take all medication on 7/28, however none on 7/29 before his procedure.  Otherwise, he is deemed stable for discharge from cardiology perspective. _____________  Discharge Vitals Blood pressure 114/86, pulse 66, temperature 98 F (36.7 C), temperature source Oral, resp. rate 18, height 5\' 11"  (1.803 m), weight 238 lb (108 kg), SpO2 96 %.  Filed Weights   03/09/18 2030 03/09/18 2111  Weight: 227 lb (103 kg) 238 lb (108 kg)  Labs & Radiologic Studies    CBC Recent Labs    03/09/18 1036  WBC 16.4*  HGB 15.9  HCT 47.7  MCV 89.3  PLT 237   Basic Metabolic Panel Recent Labs    75/64/33 1036 03/10/18 0417  NA 139 142  K 4.3 4.1  CL 105 110  CO2 24 26  GLUCOSE 124* 97  BUN 23* 17  CREATININE 1.32* 1.21  CALCIUM 8.8* 8.5*   Liver Function Tests No results for input(s): AST, ALT, ALKPHOS, BILITOT, PROT, ALBUMIN in the last 72 hours. No results for input(s): LIPASE, AMYLASE in the last 72 hours. Cardiac Enzymes No results for input(s): CKTOTAL, CKMB, CKMBINDEX, TROPONINI in the last 72  hours. BNP Invalid input(s): POCBNP D-Dimer No results for input(s): DDIMER in the last 72 hours. Hemoglobin A1C No results for input(s): HGBA1C in the last 72 hours. Fasting Lipid Panel No results for input(s): CHOL, HDL, LDLCALC, TRIG, CHOLHDL, LDLDIRECT in the last 72 hours. Thyroid Function Tests No results for input(s): TSH, T4TOTAL, T3FREE, THYROIDAB in the last 72 hours.  Invalid input(s): FREET3 _____________  Dg Chest Port 1 View  Result Date: 03/09/2018 CLINICAL DATA:  Tachycardia since yesterday. EXAM: PORTABLE CHEST 1 VIEW COMPARISON:  Chest x-ray 12/03/2017 FINDINGS: External pacer paddles are in place. The heart is upper limits of normal in size given the AP projection and semi upright position. The mediastinal and hilar contours are within normal limits. Slightly low lung volumes with streaky bibasilar atelectasis but no infiltrates, effusions or edema. The bony thorax is intact. IMPRESSION: Streaky basilar atelectasis, right greater than left but no infiltrates or effusions. Electronically Signed   By: Rudie Meyer M.D.   On: 03/09/2018 12:32   Disposition   Pt is being discharged home today in good condition.  Follow-up Plans & Appointments    Follow-up Information    [facility] Follow up.   Why:  return on 03/18/2018 for EP study with ablation. Procedure scheduled for 12PM, need to arrive by 10AM. Take all meds on 7/28, but none on 7/29. Contact information: [address] [place] [address] [phone]           Discharge Medications   Allergies as of 03/10/2018   No Known Allergies     Medication List    STOP taking these medications   aspirin 81 MG EC tablet   valsartan 80 MG tablet Commonly known as:  DIOVAN     TAKE these medications   apixaban 5 MG Tabs tablet Commonly known as:  ELIQUIS Take 1 tablet (5 mg total) by mouth 2 (two) times daily.   CIALIS 20 MG tablet Generic drug:  tadalafil Take  20 mg by mouth daily as needed for erectile dysfunction.   cyclobenzaprine 10 MG tablet Commonly known as:  FLEXERIL Take 10 mg by mouth daily as needed for muscle spasms.   diltiazem 360 MG 24 hr capsule Commonly known as:  CARDIZEM CD Take 1 capsule (360 mg total) by mouth daily. Start taking on:  03/11/2018 What changed:    medication strength  how much to take   gabapentin 300 MG capsule Commonly known as:  NEURONTIN Take 300 mg by mouth every morning.   HYDROcodone-acetaminophen 5-325 MG tablet Commonly known as:  NORCO/VICODIN Take 1 tablet by mouth 2 (two) times daily as needed for pain.   metoprolol tartrate 25 MG tablet Commonly known as:  LOPRESSOR Take 1 tablet (25 mg total) by mouth 2 (  two) times daily.   nitroGLYCERIN 0.4 MG SL tablet Commonly known as:  NITROSTAT Place 1 tablet (0.4 mg total) under the tongue every 5 (five) minutes x 3 doses as needed for chest pain.   omeprazole 40 MG capsule Commonly known as:  PRILOSEC Take 40 mg by mouth 2 (two) times daily.   oxyCODONE-acetaminophen 5-325 MG tablet Commonly known as:  PERCOCET Take 1-2 tablets by mouth every 4 (four) hours as needed for severe pain.   POTASSIMIN PO Take 1 tablet by mouth as needed (cramps).   sertraline 100 MG tablet Commonly known as:  ZOLOFT Take 200 mg by mouth daily.        Acute coronary syndrome (MI, NSTEMI, STEMI, etc) this admission?: No.    Outstanding Labs/Studies   None  Duration of Discharge Encounter   Greater than 30 minutes including physician time.  Ramond DialSigned, Hao Meng, PA 03/10/2018, 4:47 PM  EP Attending  Patient seen and examined. Agree with above. The patient is stable for DC and will followup with me for catheter ablation of atrial flutter.   Lewayne BuntingGregg Reginae Wolfrey, M.D.

## 2018-03-10 NOTE — Progress Notes (Addendum)
Progress Note  Patient Name: James Mccormick Date of Encounter: 03/10/2018  Primary Cardiologist: James AlexanderKatarina Nelson, MD   Subjective   No chest pain and no SOB.  He feels like he did on admit.   Inpatient Medications    Scheduled Meds: . apixaban  5 mg Oral BID  . cyclobenzaprine  10 mg Oral TID  . diltiazem  360 mg Oral Daily  . gabapentin  300 mg Oral q morning - 10a  . pantoprazole  40 mg Oral Daily  . sertraline  200 mg Oral Daily   Continuous Infusions:  PRN Meds: acetaminophen, HYDROcodone-acetaminophen, nitroGLYCERIN, ondansetron (ZOFRAN) IV, oxyCODONE-acetaminophen   Vital Signs    Vitals:   03/09/18 2111 03/09/18 2347 03/10/18 0500 03/10/18 0838  BP: 114/75 112/73 121/73 126/81  Pulse: (!) 109 (!) 135 98 66  Resp: 18 16 14 16   Temp: 98.2 F (36.8 C) 98.3 F (36.8 C) 98.3 F (36.8 C) 98.1 F (36.7 C)  TempSrc: Oral Oral Oral Oral  SpO2: 96% 96% 95% 96%  Weight: 238 lb (108 kg)     Height:        Intake/Output Summary (Last 24 hours) at 03/10/2018 0915 Last data filed at 03/10/2018 0500 Gross per 24 hour  Intake 0 ml  Output 950 ml  Net -950 ml   Filed Weights   03/09/18 2030 03/09/18 2111  Weight: 227 lb (103 kg) 238 lb (108 kg)    Telemetry     a flutter with improved rate control - Personally Reviewed  ECG    No new  - Personally Reviewed  Physical Exam   GEN: No acute distress.   Neck: No JVD Cardiac: irreg irreg no murmurs, rubs, or gallops.  Respiratory: Clear to auscultation bilaterally. GI: Soft, nontender, non-distended  MS: No edema; No deformity. Neuro:  Nonfocal  Psych: Normal affect   Labs    Chemistry Recent Labs  Lab 03/09/18 1036 03/10/18 0417  NA 139 142  K 4.3 4.1  CL 105 110  CO2 24 26  GLUCOSE 124* 97  BUN 23* 17  CREATININE 1.32* 1.21  CALCIUM 8.8* 8.5*  GFRNONAA >60 >60  GFRAA >60 >60  ANIONGAP 10 6     Hematology Recent Labs  Lab 03/09/18 1036  WBC 16.4*  RBC 5.34  HGB 15.9  HCT 47.7    MCV 89.3  MCH 29.8  MCHC 33.3  RDW 13.6  PLT 237    Cardiac EnzymesNo results for input(s): TROPONINI in the last 168 hours.  Recent Labs  Lab 03/09/18 1041  TROPIPOC 0.00     BNPNo results for input(s): BNP, PROBNP in the last 168 hours.   DDimer No results for input(s): DDIMER in the last 168 hours.   Radiology    Dg Chest Port 1 View  Result Date: 03/09/2018 CLINICAL DATA:  Tachycardia since yesterday. EXAM: PORTABLE CHEST 1 VIEW COMPARISON:  Chest x-ray 12/03/2017 FINDINGS: External pacer paddles are in place. The heart is upper limits of normal in size given the AP projection and semi upright position. The mediastinal and hilar contours are within normal limits. Slightly low lung volumes with streaky bibasilar atelectasis but no infiltrates, effusions or edema. The bony thorax is intact. IMPRESSION: Streaky basilar atelectasis, right greater than left but no infiltrates or effusions. Electronically Signed   By: Rudie MeyerP.  Gallerani M.D.   On: 03/09/2018 12:32    Cardiac Studies   None this admit  Patient Profile     52 y.o.  male has a hx of PAF, is setting of critical illness in 60.  Not felt to be anticoagulant candidate  At that time due to perforated ulcer.  Amiodarone was added.  Recurrent a flutter this year now on Eliquis and dilt. Now admitted with A flutter with 2:1 block.    RVR, dilt added.  Also hx of HTN.   Assessment & Plan    Recurrent atrial flutter RVR po dilt increased from 240 mg total to 360 mg today.   - EP consult for ablation.    HTN  CKD 2-3  Long term use of anticoagulation.  On Eliquis, not missed any doses.    For questions or updates, please contact [facility] Please consult [email or web address] for contact info under Cardiology/STEMI.      Signed, James Boozer, NP  03/10/2018, 9:15 AM     Patient seen and examined. Agree with assessment and plan.  Patient feels improved.  Remains in atrial flutter with a ventricular rate in the 90s.  His  Cardizem dose was increased to 360 mg daily.  We will add low-dose metoprolol 25 mg twice a day to his regimen.  He has been on Eliquis since April 2019.  An echo Doppler study at that time showed normal LV function.  However, he had moderate LA dilatation, and mild RA and RV dilation.  The patient states at that presentation he had been in an abnormal rhythm for 10 days prior to being evaluated.  Hopefully he will pharmacologically cardiovert.  However if not, consider DC cardioversion with him being fully anticoagulated.  Recommend EP evaluation for consideration of possible atrial flutter ablation.  Patient has a mesomorphic body habitus with thick neck Mallampati scale of at least 3.  May consider outpatient sleep evaluation for consideration of obstructive sleep apnea since according to his wife he does snore he admits to recent weight gain.   James Bihari, MD, [facility] 03/10/2018 9:56 AM

## 2018-03-18 ENCOUNTER — Ambulatory Visit (HOSPITAL_COMMUNITY)
Admission: RE | Disposition: A | Discharge status: Home / Self Care | Payer: Self-pay | Source: Ambulatory Visit | Attending: Internal Medicine

## 2018-03-18 ENCOUNTER — Ambulatory Visit (HOSPITAL_COMMUNITY)
Admission: RE | Admit: 2018-03-18 | Discharge: 2018-03-18 | Disposition: A | Discharge status: Home / Self Care | Payer: Medicare HMO | Source: Ambulatory Visit | Attending: Internal Medicine | Admitting: Internal Medicine

## 2018-03-18 ENCOUNTER — Other Ambulatory Visit: Payer: Self-pay

## 2018-03-18 DIAGNOSIS — K219 Gastro-esophageal reflux disease without esophagitis: Secondary | ICD-10-CM | POA: Diagnosis not present

## 2018-03-18 DIAGNOSIS — Z7901 Long term (current) use of anticoagulants: Secondary | ICD-10-CM | POA: Diagnosis not present

## 2018-03-18 DIAGNOSIS — Z8619 Personal history of other infectious and parasitic diseases: Secondary | ICD-10-CM | POA: Insufficient documentation

## 2018-03-18 DIAGNOSIS — I483 Typical atrial flutter: Secondary | ICD-10-CM | POA: Insufficient documentation

## 2018-03-18 DIAGNOSIS — Z9889 Other specified postprocedural states: Secondary | ICD-10-CM | POA: Diagnosis not present

## 2018-03-18 DIAGNOSIS — N179 Acute kidney failure, unspecified: Secondary | ICD-10-CM | POA: Diagnosis not present

## 2018-03-18 DIAGNOSIS — I4892 Unspecified atrial flutter: Secondary | ICD-10-CM | POA: Diagnosis present

## 2018-03-18 DIAGNOSIS — Z87891 Personal history of nicotine dependence: Secondary | ICD-10-CM | POA: Diagnosis not present

## 2018-03-18 DIAGNOSIS — E46 Unspecified protein-calorie malnutrition: Secondary | ICD-10-CM | POA: Insufficient documentation

## 2018-03-18 DIAGNOSIS — Z86718 Personal history of other venous thrombosis and embolism: Secondary | ICD-10-CM | POA: Insufficient documentation

## 2018-03-18 DIAGNOSIS — Z8249 Family history of ischemic heart disease and other diseases of the circulatory system: Secondary | ICD-10-CM | POA: Diagnosis not present

## 2018-03-18 DIAGNOSIS — I10 Essential (primary) hypertension: Secondary | ICD-10-CM | POA: Insufficient documentation

## 2018-03-18 DIAGNOSIS — Z7982 Long term (current) use of aspirin: Secondary | ICD-10-CM | POA: Diagnosis not present

## 2018-03-18 DIAGNOSIS — Z8719 Personal history of other diseases of the digestive system: Secondary | ICD-10-CM | POA: Insufficient documentation

## 2018-03-18 DIAGNOSIS — I4891 Unspecified atrial fibrillation: Secondary | ICD-10-CM | POA: Insufficient documentation

## 2018-03-18 DIAGNOSIS — Z79899 Other long term (current) drug therapy: Secondary | ICD-10-CM | POA: Diagnosis not present

## 2018-03-18 HISTORY — PX: A-FLUTTER ABLATION: EP1230

## 2018-03-18 LAB — CBC
HCT: 45.1 % (ref 39.0–52.0)
Hemoglobin: 14.8 g/dL (ref 13.0–17.0)
MCH: 29.6 pg (ref 26.0–34.0)
MCHC: 32.8 g/dL (ref 30.0–36.0)
MCV: 90.2 fL (ref 78.0–100.0)
PLATELETS: 230 10*3/uL (ref 150–400)
RBC: 5 MIL/uL (ref 4.22–5.81)
RDW: 13.6 % (ref 11.5–15.5)
WBC: 7.8 10*3/uL (ref 4.0–10.5)

## 2018-03-18 SURGERY — A-FLUTTER ABLATION

## 2018-03-18 MED ORDER — LIDOCAINE HCL (PF) 1 % IJ SOLN
INTRAMUSCULAR | Status: DC | PRN
  Administered 2018-03-18: 60 mL

## 2018-03-18 MED ORDER — ONDANSETRON HCL 4 MG/2ML IJ SOLN
4.0000 mg | Freq: Once | INTRAMUSCULAR | Status: AC
  Administered 2018-03-18: 4 mg via INTRAVENOUS

## 2018-03-18 MED ORDER — SODIUM CHLORIDE 0.9% FLUSH: 3.0000 mL | Freq: Two times a day (BID) | INTRAVENOUS | Status: DC

## 2018-03-18 MED ORDER — MIDAZOLAM HCL 5 MG/5ML IJ SOLN
INTRAMUSCULAR | Status: AC
  Filled 2018-03-18: qty 5

## 2018-03-18 MED ORDER — FENTANYL CITRATE (PF) 100 MCG/2ML IJ SOLN
INTRAMUSCULAR | Status: AC
  Filled 2018-03-18: qty 2

## 2018-03-18 MED ORDER — ONDANSETRON HCL 4 MG/2ML IJ SOLN
INTRAMUSCULAR | Status: AC
  Filled 2018-03-18: qty 2

## 2018-03-18 MED ORDER — SODIUM CHLORIDE 0.9% FLUSH: 3.0000 mL | INTRAVENOUS | Status: DC | PRN

## 2018-03-18 MED ORDER — HEPARIN (PORCINE) IN NACL 1000-0.9 UT/500ML-% IV SOLN
INTRAVENOUS | Status: DC | PRN
  Administered 2018-03-18: 500 mL

## 2018-03-18 MED ORDER — ACETAMINOPHEN 500 MG PO TABS
ORAL_TABLET | ORAL | Status: AC
  Filled 2018-03-18: qty 2

## 2018-03-18 MED ORDER — HEPARIN SODIUM (PORCINE) 1000 UNIT/ML IJ SOLN
INTRAMUSCULAR | Status: AC
  Filled 2018-03-18: qty 1

## 2018-03-18 MED ORDER — SODIUM CHLORIDE 0.9 % IV SOLN
INTRAVENOUS | Status: DC
  Administered 2018-03-18: 06:00:00 via INTRAVENOUS

## 2018-03-18 MED ORDER — ACETAMINOPHEN 500 MG PO TABS
1000.0000 mg | ORAL_TABLET | Freq: Once | ORAL | Status: AC
  Administered 2018-03-18: 1000 mg via ORAL
  Filled 2018-03-18: qty 2

## 2018-03-18 MED ORDER — MIDAZOLAM HCL 5 MG/5ML IJ SOLN
INTRAMUSCULAR | Status: DC | PRN
  Administered 2018-03-18 (×3): 1 mg via INTRAVENOUS
  Administered 2018-03-18: 2 mg via INTRAVENOUS
  Administered 2018-03-18 (×6): 1 mg via INTRAVENOUS

## 2018-03-18 MED ORDER — DOXYCYCLINE HYCLATE 100 MG PO TABS: 100.0000 mg | ORAL_TABLET | Freq: Two times a day (BID) | ORAL | 0 refills | Status: AC

## 2018-03-18 MED ORDER — SODIUM CHLORIDE 0.9 % IV SOLN: 250.0000 mL | INTRAVENOUS | Status: DC | PRN

## 2018-03-18 MED ORDER — HEPARIN (PORCINE) IN NACL 1000-0.9 UT/500ML-% IV SOLN
INTRAVENOUS | Status: AC
  Filled 2018-03-18: qty 500

## 2018-03-18 MED ORDER — ONDANSETRON HCL 4 MG/2ML IJ SOLN: 4.0000 mg | Freq: Four times a day (QID) | INTRAMUSCULAR | Status: DC | PRN

## 2018-03-18 MED ORDER — LIDOCAINE HCL 1 % IJ SOLN
INTRAMUSCULAR | Status: AC
  Filled 2018-03-18: qty 60

## 2018-03-18 MED ORDER — ACETAMINOPHEN 325 MG PO TABS
650.0000 mg | ORAL_TABLET | ORAL | Status: DC | PRN
  Filled 2018-03-18: qty 2

## 2018-03-18 MED ORDER — FENTANYL CITRATE (PF) 100 MCG/2ML IJ SOLN
INTRAMUSCULAR | Status: DC | PRN
  Administered 2018-03-18 (×2): 12.5 ug via INTRAVENOUS
  Administered 2018-03-18: 25 ug via INTRAVENOUS
  Administered 2018-03-18 (×7): 12.5 ug via INTRAVENOUS

## 2018-03-18 MED ORDER — HEPARIN SODIUM (PORCINE) 1000 UNIT/ML IJ SOLN
INTRAMUSCULAR | Status: DC | PRN
  Administered 2018-03-18: 1000 [IU] via INTRAVENOUS

## 2018-03-18 SURGICAL SUPPLY — 11 items
BAG SNAP BAND KOVER 36X36 (MISCELLANEOUS) IMPLANT
CATH SMTCH THERMOCOOL SF FJ (CATHETERS) ×3 IMPLANT
CATH WEBSTER BI DIR CS D-F CRV (CATHETERS) ×3 IMPLANT
PACK EP LATEX FREE (CUSTOM PROCEDURE TRAY) ×2
PACK EP LF (CUSTOM PROCEDURE TRAY) ×1 IMPLANT
PAD DEFIB LIFELINK (PAD) ×3 IMPLANT
PATCH CARTO3 (PAD) ×3 IMPLANT
SHEATH PINNACLE 6F 10CM (SHEATH) ×3 IMPLANT
SHEATH PINNACLE 7F 10CM (SHEATH) ×3 IMPLANT
SHEATH PINNACLE 8F 10CM (SHEATH) ×3 IMPLANT
TUBING SMART ABLATE COOLFLOW (TUBING) ×3 IMPLANT

## 2018-03-18 NOTE — Interval H&P Note (Signed)
History and Physical Interval Note:  03/18/2018 7:14 AM  James Mccormick  has presented today for surgery, with the diagnosis of aflutter  The various methods of treatment have been discussed with the patient and family. After consideration of risks, benefits and other options for treatment, the patient has consented to  Procedure(s): A-FLUTTER ABLATION (N/A) as a surgical intervention .  The patient's history has been reviewed, patient examined, no change in status, stable for surgery.  I have reviewed the patient's chart and labs.  Questions were answered to the patient's satisfaction.     Lewayne BuntingGregg Laylonie Marzec

## 2018-03-18 NOTE — Progress Notes (Signed)
Site area: Right groin venous sheaths were removed  Site Prior to Removal:  Level 0  Pressure Applied For 20 MINUTES    Bedrest Beginning at 1015am  Manual:   Yes.    Patient Status During Pull:  stable  Post Pull Groin Site:  Level 0  Post Pull Instructions Given:  Yes.    Post Pull Pulses Present:  Yes.    Dressing Applied:  Yes.    Comments:  VS remain stable  Pt afebrile

## 2018-03-18 NOTE — Progress Notes (Signed)
Client up and walked and tolerated well; right groin stable, no bleeding or hematoma 

## 2018-03-18 NOTE — Discharge Instructions (Signed)
Femoral Site Care °Refer to this sheet in the next few weeks. These instructions provide you with information about caring for yourself after your procedure. Your health care provider may also give you more specific instructions. Your treatment has been planned according to current medical practices, but problems sometimes occur. Call your health care provider if you have any problems or questions after your procedure. °What can I expect after the procedure? °After your procedure, it is typical to have the following: °· Bruising at the site that usually fades within 1-2 weeks. °· Blood collecting in the tissue (hematoma) that may be painful to the touch. It should usually decrease in size and tenderness within 1-2 weeks. ° °Follow these instructions at home: °· Take medicines only as directed by your health care provider. °· You may shower 24-48 hours after the procedure or as directed by your health care provider. Remove the bandage (dressing) and gently wash the site with plain soap and water. Pat the area dry with a clean towel. Do not rub the site, because this may cause bleeding. °· Do not take baths, swim, or use a hot tub until your health care provider approves. °· Check your insertion site every day for redness, swelling, or drainage. °· Do not apply powder or lotion to the site. °· Limit use of stairs to twice a day for the first 2-3 days or as directed by your health care provider. °· Do not squat for the first 2-3 days or as directed by your health care provider. °· Do not lift over 10 lb (4.5 kg) for 5 days after your procedure or as directed by your health care provider. °· Ask your health care provider when it is okay to: °? Return to work or school. °? Resume usual physical activities or sports. °? Resume sexual activity. °· Do not drive home if you are discharged the same day as the procedure. Have someone else drive you. °· You may drive 24 hours after the procedure unless otherwise instructed by  your health care provider. °· Do not operate machinery or power tools for 24 hours after the procedure or as directed by your health care provider. °· If your procedure was done as an outpatient procedure, which means that you went home the same day as your procedure, a responsible adult should be with you for the first 24 hours after you arrive home. °· Keep all follow-up visits as directed by your health care provider. This is important. °Contact a health care provider if: °· You have a fever. °· You have chills. °· You have increased bleeding from the site. Hold pressure on the site. °Get help right away if: °· You have unusual pain at the site. °· You have redness, warmth, or swelling at the site. °· You have drainage (other than a small amount of blood on the dressing) from the site. °· The site is bleeding, and the bleeding does not stop after 30 minutes of holding steady pressure on the site. °· Your leg or foot becomes pale, cool, tingly, or numb. °This information is not intended to replace advice given to you by your health care provider. Make sure you discuss any questions you have with your health care provider. °Document Released: 04/10/2014 Document Revised: 01/13/2016 Document Reviewed: 02/24/2014 °Elsevier Interactive Patient Education © 2018 [employer] ° ° °Moderate Conscious Sedation, Adult, Care After °These instructions provide you with information about caring for yourself after your procedure. Your health care provider may also give   you more specific instructions. Your treatment has been planned according to current medical practices, but problems sometimes occur. Call your health care provider if you have any problems or questions after your procedure. °What can I expect after the procedure? °After your procedure, it is common: °· To feel sleepy for several hours. °· To feel clumsy and have poor balance for several hours. °· To have poor judgment for several hours. °· To vomit if you eat  too soon. ° °Follow these instructions at home: °For at least 24 hours after the procedure: ° °· Do not: °? Participate in activities where you could fall or become injured. °? Drive. °? Use heavy machinery. °? Drink alcohol. °? Take sleeping pills or medicines that cause drowsiness. °? Make important decisions or sign legal documents. °? Take care of children on your own. °· Rest. °Eating and drinking °· Follow the diet recommended by your health care provider. °· If you vomit: °? Drink water, juice, or soup when you can drink without vomiting. °? Make sure you have little or no nausea before eating solid foods. °General instructions °· Have a responsible adult stay with you until you are awake and alert. °· Take over-the-counter and prescription medicines only as told by your health care provider. °· If you smoke, do not smoke without supervision. °· Keep all follow-up visits as told by your health care provider. This is important. °Contact a health care provider if: °· You keep feeling nauseous or you keep vomiting. °· You feel light-headed. °· You develop a rash. °· You have a fever. °Get help right away if: °· You have trouble breathing. °This information is not intended to replace advice given to you by your health care provider. Make sure you discuss any questions you have with your health care provider. °Document Released: 05/28/2013 Document Revised: 01/10/2016 Document Reviewed: 11/27/2015 °Elsevier Interactive Patient Education © 2018 [employer] ° ° ° °Post procedure care instructions °No driving for 4 days. No lifting over 5 lbs for 1 week. No vigorous or sexual activity for 1 week. You may return to work on 03/25/18. Keep procedure site clean & dry. If you notice increased pain, swelling, bleeding or pus, call/return!  You may shower, but no soaking baths/hot tubs/pools for 1 week.  ° ° ° °

## 2018-03-18 NOTE — Progress Notes (Addendum)
Patient with low grade fever and nauseous. Zofran given. Dr Ladona Ridgelaylor paged.  Orders for tylenol, given.

## 2018-03-19 ENCOUNTER — Encounter (HOSPITAL_COMMUNITY): Payer: Self-pay | Admitting: Internal Medicine

## 2018-03-19 MED FILL — Lidocaine HCl Local Inj 1%: INTRAMUSCULAR | Qty: 60 | Status: AC

## 2018-04-15 ENCOUNTER — Ambulatory Visit: Payer: Medicare HMO | Admitting: Internal Medicine

## 2018-04-15 ENCOUNTER — Encounter: Payer: Self-pay | Admitting: Internal Medicine

## 2018-04-15 VITALS — BP 130/94 | HR 69 | Ht 71.0 in | Wt 241.4 lb

## 2018-04-15 DIAGNOSIS — I4892 Unspecified atrial flutter: Secondary | ICD-10-CM | POA: Diagnosis not present

## 2018-04-15 MED ORDER — VALSARTAN 40 MG PO TABS: 40.0000 mg | ORAL_TABLET | Freq: Every day | ORAL | 3 refills | Status: AC

## 2018-04-15 MED ORDER — DILTIAZEM HCL ER COATED BEADS 360 MG PO CP24: ORAL_CAPSULE | ORAL | 0 refills | Status: AC

## 2018-04-15 NOTE — Patient Instructions (Addendum)
Medication Instructions:  Your physician has recommended you make the following change in your medication:  1.  Stop taking Eliquis 2.  Wean off your Cardizem 360 mg- Take one capsule every other day for 10 days and then stop. 3.  Restart Diovan- Take 40 mg by mouth daily.  Labwork: None ordered.  Testing/Procedures: None ordered.  Follow-Up: Your physician wants you to follow-up in: as needed with Dr. Ladona Ridgelaylor.      Any Other Special Instructions Will Be Listed Below (If Applicable).  If you need a refill on your cardiac medications before your next appointment, please call your pharmacy.

## 2018-04-15 NOTE — Progress Notes (Signed)
HPI [name] returns today for followup after undergoing EPS/RFA of atrial flutter. He is a pleasant 52 yo man with atrial flutter who undewent EP study and catheter ablation several weeks ago. He did well. He notes that his blood pressure is mostly controlled. He feels some fatigue on cardizem and would like to switch back to his diovan.  No Known Allergies   Current Outpatient Medications  Medication Sig Dispense Refill  . cyclobenzaprine (FLEXERIL) 10 MG tablet Take 10 mg by mouth daily as needed for muscle spasms.     Marland Kitchen. doxycycline (VIBRA-TABS) 100 MG tablet Take 1 tablet (100 mg total) by mouth 2 (two) times daily. Take only if you have recurrent fever 14 tablet 0  . gabapentin (NEURONTIN) 300 MG capsule Take 300 mg by mouth every morning.     Marland Kitchen. HYDROcodone-acetaminophen (NORCO/VICODIN) 5-325 MG tablet Take 1 tablet by mouth 2 (two) times daily as needed for pain.  0  . nitroGLYCERIN (NITROSTAT) 0.4 MG SL tablet Place 1 tablet (0.4 mg total) under the tongue every 5 (five) minutes x 3 doses as needed for chest pain. 25 tablet 1  . omeprazole (PRILOSEC) 40 MG capsule Take 40 mg by mouth 2 (two) times daily.  11  . oxyCODONE-acetaminophen (PERCOCET) 5-325 MG tablet Take 1-2 tablets by mouth every 4 (four) hours as needed for severe pain. 15 tablet 0  . Potassium (POTASSIMIN PO) Take 1 tablet by mouth as needed (cramps).    . sertraline (ZOLOFT) 100 MG tablet Take 200 mg by mouth daily.     Marland Kitchen. diltiazem (CARDIZEM CD) 360 MG 24 hr capsule Take one capsule every other day x 10 days and then stop 5 capsule 0  . valsartan (DIOVAN) 40 MG tablet Take 1 tablet (40 mg total) by mouth daily. 90 tablet 3   No current facility-administered medications for this visit.      Past Medical History:  Diagnosis Date  . Abdominal abscess   . Acute encephalopathy   . Acute kidney injury (HCC)   . Acute respiratory failure (HCC)   . Anemia   . Atrial fibrillation (HCC)    a. Documented 08/2013 in  setting of severe illness, amiodarone used. Not felt to be anticoag candidate at that time.  . C. difficile colitis   . Cholecystitis   . Depression   . DVT (deep venous thrombosis) (HCC)   . Elevated troponin    a. 08/2013: felt due to demand ischemia in setting of severe illness.  Marland Kitchen. GERD (gastroesophageal reflux disease)   . Hypertension   . LV dysfunction    a. 08/2013: EF 45% setting of severe illness.  . Perforated, severe stomach ulcer (HCC)   . Protein-calorie malnutrition (HCC)   . Sepsis (HCC)     ROS:   All systems reviewed and negative except as noted in the HPI.   Past Surgical History:  Procedure Laterality Date  . A-FLUTTER ABLATION N/A 03/18/2018   Procedure: A-FLUTTER ABLATION;  Surgeon: Marinus Mawaylor, Cordie Buening W, MD;  [facility];  Service: Cardiovascular;  Laterality: N/A;  . COLON SURGERY    . HERNIA REPAIR    . LAPAROTOMY N/A 08/28/2013   Procedure: EXPLORATORY LAPAROTOMY,debridment of nacrotic stomach,and primary repair of perforated stomach.;  Surgeon: Atilano InaEric M Wilson, MD;  [facility];  Service: General;  Laterality: N/A;  . NECK SURGERY       Family History  Problem Relation Age of Onset  . Mental  illness Mother   . Hypertension Sister   . Hypertension Brother      Social History   Socioeconomic History  . Marital status: Divorced    Spouse name: Not on file  . Number of children: Not on file  . Years of education: Not on file  . Highest education level: Not on file  Occupational History  . Not on file  Social Needs  . Financial resource strain: Not on file  . Food insecurity:    Worry: Not on file    Inability: Not on file  . Transportation needs:    Medical: Not on file    Non-medical: Not on file  Tobacco Use  . Smoking status: Former Smoker    Types: Cigarettes  . Smokeless tobacco: Never Used  . Tobacco comment: Quit 7 weeks before adm in 08/2013  Substance and Sexual Activity  . Alcohol use: No  . Drug use: No  .  Sexual activity: Not on file  Lifestyle  . Physical activity:    Days per week: Not on file    Minutes per session: Not on file  . Stress: Not on file  Relationships  . Social connections:    Talks on phone: Not on file    Gets together: Not on file    Attends religious service: Not on file    Active member of club or organization: Not on file    Attends meetings of clubs or organizations: Not on file    Relationship status: Not on file  . Intimate partner violence:    Fear of current or ex partner: Not on file    Emotionally abused: Not on file    Physically abused: Not on file    Forced sexual activity: Not on file  Other Topics Concern  . Not on file  Social History Narrative  . Not on file     BP (!) 130/94   Pulse 69   Ht 5\' 11"  (1.803 m)   Wt 241 lb 6.4 oz (109.5 kg)   SpO2 95%   BMI 33.67 kg/m   Physical Exam:  Well appearing 52 yo man, NAD HEENT: Unremarkable Neck:  6 cm JVD, no thyromegally Lymphatics:  No adenopathy Back:  No CVA tenderness Lungs:  Clear with no wheezes HEART:  Regular rate rhythm, no murmurs, no rubs, no clicks Abd:  soft, positive bowel sounds, no organomegally, no rebound, no guarding Ext:  2 plus pulses, no edema, no cyanosis, no clubbing Skin:  No rashes no nodules Neuro:  CN II through XII intact, motor grossly intact  EKG - nsr   Assess/Plan: 1. Atrial flutter - he is s/p EPS/ RFA of atrial flutter. He is doing well. He will stop his anti- coagulation. 2. HTN - he will restart his diovan. He will wean off of cardiazem.   Leonia Reeves.D.

## 2018-05-09 ENCOUNTER — Ambulatory Visit: Payer: Self-pay | Admitting: Cardiology

## 2018-09-08 ENCOUNTER — Other Ambulatory Visit (HOSPITAL_COMMUNITY): Payer: Self-pay | Admitting: Physician Assistant

## 2018-10-18 ENCOUNTER — Other Ambulatory Visit (HOSPITAL_COMMUNITY): Payer: Self-pay | Admitting: Adult Health Nurse Practitioner

## 2018-10-18 ENCOUNTER — Encounter (HOSPITAL_COMMUNITY): Payer: Self-pay

## 2018-10-18 ENCOUNTER — Ambulatory Visit (HOSPITAL_COMMUNITY)
Admission: RE | Admit: 2018-10-18 | Discharge: 2018-10-18 | Disposition: A | Discharge status: Home / Self Care | Payer: Medicare HMO | Source: Ambulatory Visit | Attending: Adult Health Nurse Practitioner | Admitting: Adult Health Nurse Practitioner

## 2018-10-18 DIAGNOSIS — R52 Pain, unspecified: Secondary | ICD-10-CM

## 2019-01-14 ENCOUNTER — Encounter (HOSPITAL_COMMUNITY): Payer: Self-pay | Admitting: Emergency Medicine

## 2019-01-14 ENCOUNTER — Emergency Department (HOSPITAL_COMMUNITY)
Admission: EM | Admit: 2019-01-14 | Discharge: 2019-01-14 | Discharge status: Against Medical Advice | Payer: Medicare HMO | Attending: Emergency Medicine | Admitting: Emergency Medicine

## 2019-01-14 ENCOUNTER — Other Ambulatory Visit: Payer: Self-pay

## 2019-01-14 DIAGNOSIS — Z5321 Procedure and treatment not carried out due to patient leaving prior to being seen by health care provider: Secondary | ICD-10-CM | POA: Diagnosis not present

## 2019-01-14 DIAGNOSIS — K921 Melena: Secondary | ICD-10-CM | POA: Diagnosis present

## 2019-01-14 LAB — COMPREHENSIVE METABOLIC PANEL
ALT: 22 U/L (ref 0–44)
AST: 19 U/L (ref 15–41)
Albumin: 3.8 g/dL (ref 3.5–5.0)
Alkaline Phosphatase: 56 U/L (ref 38–126)
Anion gap: 7 (ref 5–15)
BUN: 26 mg/dL — ABNORMAL HIGH (ref 6–20)
CO2: 27 mmol/L (ref 22–32)
Calcium: 8.9 mg/dL (ref 8.9–10.3)
Chloride: 107 mmol/L (ref 98–111)
Creatinine, Ser: 1.39 mg/dL — ABNORMAL HIGH (ref 0.61–1.24)
GFR calc Af Amer: >60 mL/min (ref >60)
GFR calc non Af Amer: 57 mL/min — ABNORMAL LOW (ref >60)
Glucose, Bld: 127 mg/dL — ABNORMAL HIGH (ref 70–99)
Potassium: 4.7 mmol/L (ref 3.5–5.1)
Sodium: 141 mmol/L (ref 135–145)
Total Bilirubin: 1.1 mg/dL (ref 0.3–1.2)
Total Protein: 6.3 g/dL — ABNORMAL LOW (ref 6.5–8.1)

## 2019-01-14 LAB — CBC WITH DIFFERENTIAL/PLATELET
Abs Immature Granulocytes: 0.08 10*3/uL — ABNORMAL HIGH (ref 0.00–0.07)
Basophils Absolute: 0 10*3/uL (ref 0.0–0.1)
Basophils Relative: 0 %
Eosinophils Absolute: 0 10*3/uL (ref 0.0–0.5)
Eosinophils Relative: 0 %
HCT: 46.6 % (ref 39.0–52.0)
Hemoglobin: 15.3 g/dL (ref 13.0–17.0)
Immature Granulocytes: 0 %
Lymphocytes Relative: 7 %
Lymphs Abs: 1.5 10*3/uL (ref 0.7–4.0)
MCH: 28.9 pg (ref 26.0–34.0)
MCHC: 32.8 g/dL (ref 30.0–36.0)
MCV: 88.1 fL (ref 80.0–100.0)
Monocytes Absolute: 0.9 10*3/uL (ref 0.1–1.0)
Monocytes Relative: 5 %
Neutro Abs: 18 10*3/uL — ABNORMAL HIGH (ref 1.7–7.7)
Neutrophils Relative %: 88 %
Platelets: 202 10*3/uL (ref 150–400)
RBC: 5.29 MIL/uL (ref 4.22–5.81)
RDW: 13.7 % (ref 11.5–15.5)
WBC: 20.6 10*3/uL — ABNORMAL HIGH (ref 4.0–10.5)
nRBC: 0 % (ref 0.0–0.2)

## 2019-01-14 NOTE — ED Triage Notes (Signed)
Black stools x 4 days ago abd pain, had colonoscopy last Monday  And endo  States needs surgery unsure why he states

## 2019-01-14 NOTE — ED Notes (Signed)
Pt. States hes leaving, wait is too long. Pulling OTF
